# Patient Record
Sex: Female | Born: 1965
Health system: Southern US, Community
[De-identification: ages and names within clinical notes are randomized; demographics above are authoritative.]

## PROBLEM LIST (undated history)

## (undated) DIAGNOSIS — E873 Alkalosis: Secondary | ICD-10-CM

## (undated) DIAGNOSIS — I10 Essential (primary) hypertension: Secondary | ICD-10-CM

## (undated) DIAGNOSIS — K8020 Calculus of gallbladder without cholecystitis without obstruction: Secondary | ICD-10-CM

## (undated) DIAGNOSIS — K219 Gastro-esophageal reflux disease without esophagitis: Secondary | ICD-10-CM

## (undated) DIAGNOSIS — D649 Anemia, unspecified: Secondary | ICD-10-CM

## (undated) DIAGNOSIS — M1990 Unspecified osteoarthritis, unspecified site: Secondary | ICD-10-CM

## (undated) DIAGNOSIS — J189 Pneumonia, unspecified organism: Secondary | ICD-10-CM

## (undated) DIAGNOSIS — J449 Chronic obstructive pulmonary disease, unspecified: Secondary | ICD-10-CM

## (undated) DIAGNOSIS — D869 Sarcoidosis, unspecified: Secondary | ICD-10-CM

## (undated) DIAGNOSIS — E039 Hypothyroidism, unspecified: Secondary | ICD-10-CM

## (undated) DIAGNOSIS — F458 Other somatoform disorders: Secondary | ICD-10-CM

## (undated) DIAGNOSIS — I509 Heart failure, unspecified: Secondary | ICD-10-CM

## (undated) DIAGNOSIS — N200 Calculus of kidney: Secondary | ICD-10-CM

## (undated) DIAGNOSIS — J302 Other seasonal allergic rhinitis: Secondary | ICD-10-CM

## (undated) DIAGNOSIS — G4730 Sleep apnea, unspecified: Secondary | ICD-10-CM

## (undated) DIAGNOSIS — Z8719 Personal history of other diseases of the digestive system: Secondary | ICD-10-CM

## (undated) HISTORY — DX: Gastro-esophageal reflux disease without esophagitis: K21.9

## (undated) HISTORY — DX: Hypothyroidism, unspecified: E03.9

## (undated) HISTORY — DX: Sleep apnea, unspecified: G47.30

## (undated) HISTORY — DX: Anemia, unspecified: D64.9

## (undated) HISTORY — DX: Essential (primary) hypertension: I10

## (undated) HISTORY — DX: Calculus of kidney: N20.0

## (undated) HISTORY — PX: LUNG BIOPSY: SHX232

## (undated) HISTORY — PX: OTHER SURGICAL HISTORY: SHX169

## (undated) HISTORY — DX: Calculus of gallbladder without cholecystitis without obstruction: K80.20

## (undated) HISTORY — DX: Morbid (severe) obesity due to excess calories: E66.01

---

## 1998-10-31 HISTORY — PX: CHOLECYSTECTOMY: SHX55

## 1999-06-15 ENCOUNTER — Encounter: Payer: Self-pay | Admitting: *Deleted

## 1999-06-15 ENCOUNTER — Ambulatory Visit (HOSPITAL_COMMUNITY): Admission: RE | Admit: 1999-06-15 | Discharge: 1999-06-15 | Payer: Self-pay | Admitting: *Deleted

## 1999-06-18 ENCOUNTER — Encounter (INDEPENDENT_AMBULATORY_CARE_PROVIDER_SITE_OTHER): Payer: Self-pay | Admitting: Specialist

## 1999-06-18 ENCOUNTER — Observation Stay (HOSPITAL_COMMUNITY): Admission: RE | Admit: 1999-06-18 | Discharge: 1999-06-19 | Payer: Self-pay | Admitting: *Deleted

## 2004-08-06 ENCOUNTER — Ambulatory Visit (HOSPITAL_BASED_OUTPATIENT_CLINIC_OR_DEPARTMENT_OTHER): Admission: RE | Admit: 2004-08-06 | Discharge: 2004-08-06 | Payer: Self-pay | Admitting: Family Medicine

## 2007-08-06 ENCOUNTER — Other Ambulatory Visit: Admission: RE | Admit: 2007-08-06 | Discharge: 2007-08-06 | Payer: Self-pay | Admitting: Obstetrics and Gynecology

## 2007-08-13 ENCOUNTER — Ambulatory Visit (HOSPITAL_COMMUNITY): Admission: RE | Admit: 2007-08-13 | Discharge: 2007-08-13 | Payer: Self-pay | Admitting: Obstetrics and Gynecology

## 2007-09-05 ENCOUNTER — Encounter: Admission: RE | Admit: 2007-09-05 | Discharge: 2007-12-04 | Payer: Self-pay | Admitting: Sports Medicine

## 2007-12-03 ENCOUNTER — Encounter: Admission: RE | Admit: 2007-12-03 | Discharge: 2007-12-03 | Payer: Self-pay | Admitting: Gastroenterology

## 2010-11-21 ENCOUNTER — Encounter: Payer: Self-pay | Admitting: Obstetrics and Gynecology

## 2010-11-21 ENCOUNTER — Encounter: Payer: Self-pay | Admitting: Gastroenterology

## 2011-01-24 ENCOUNTER — Emergency Department (HOSPITAL_COMMUNITY): Payer: Medicaid Other

## 2011-01-24 ENCOUNTER — Inpatient Hospital Stay (HOSPITAL_COMMUNITY)
Admit: 2011-01-24 | Discharge: 2011-01-29 | DRG: 194 | Disposition: A | Discharge status: Home / Self Care | Payer: Medicaid Other | Attending: Internal Medicine | Admitting: Internal Medicine

## 2011-01-24 DIAGNOSIS — E662 Morbid (severe) obesity with alveolar hypoventilation: Secondary | ICD-10-CM | POA: Diagnosis present

## 2011-01-24 DIAGNOSIS — K219 Gastro-esophageal reflux disease without esophagitis: Secondary | ICD-10-CM | POA: Diagnosis present

## 2011-01-24 DIAGNOSIS — R0902 Hypoxemia: Secondary | ICD-10-CM | POA: Diagnosis present

## 2011-01-24 DIAGNOSIS — G4733 Obstructive sleep apnea (adult) (pediatric): Secondary | ICD-10-CM | POA: Diagnosis present

## 2011-01-24 DIAGNOSIS — I10 Essential (primary) hypertension: Secondary | ICD-10-CM | POA: Diagnosis present

## 2011-01-24 DIAGNOSIS — R0602 Shortness of breath: Secondary | ICD-10-CM

## 2011-01-24 DIAGNOSIS — Z9119 Patient's noncompliance with other medical treatment and regimen: Secondary | ICD-10-CM

## 2011-01-24 DIAGNOSIS — J189 Pneumonia, unspecified organism: Secondary | ICD-10-CM | POA: Diagnosis present

## 2011-01-24 DIAGNOSIS — D509 Iron deficiency anemia, unspecified: Secondary | ICD-10-CM | POA: Diagnosis present

## 2011-01-24 DIAGNOSIS — R112 Nausea with vomiting, unspecified: Secondary | ICD-10-CM | POA: Diagnosis present

## 2011-01-24 DIAGNOSIS — Z91199 Patient's noncompliance with other medical treatment and regimen due to unspecified reason: Secondary | ICD-10-CM

## 2011-01-24 DIAGNOSIS — E039 Hypothyroidism, unspecified: Secondary | ICD-10-CM | POA: Diagnosis present

## 2011-01-24 LAB — DIFFERENTIAL
Basophils Relative: 0 % (ref 0–1)
Eosinophils Relative: 4 % (ref 0–5)
Lymphs Abs: 1.4 10*3/uL (ref 0.7–4.0)
Monocytes Relative: 7 % (ref 3–12)
Neutro Abs: 6.9 10*3/uL (ref 1.7–7.7)

## 2011-01-24 LAB — PROTIME-INR: INR: 1.16 (ref 0.00–1.49)

## 2011-01-24 LAB — CBC
HCT: 33.3 % — ABNORMAL LOW (ref 36.0–46.0)
Hemoglobin: 8.8 g/dL — ABNORMAL LOW (ref 12.0–15.0)
MCH: 15.7 pg — ABNORMAL LOW (ref 26.0–34.0)
MCHC: 26.4 g/dL — ABNORMAL LOW (ref 30.0–36.0)

## 2011-01-24 LAB — BASIC METABOLIC PANEL
CO2: 30 mEq/L (ref 19–32)
Calcium: 8.9 mg/dL (ref 8.4–10.5)
Glucose, Bld: 102 mg/dL — ABNORMAL HIGH (ref 70–99)
Sodium: 136 mEq/L (ref 135–145)

## 2011-01-25 DIAGNOSIS — R0602 Shortness of breath: Secondary | ICD-10-CM

## 2011-01-25 LAB — BASIC METABOLIC PANEL
CO2: 28 mEq/L (ref 19–32)
Chloride: 101 mEq/L (ref 96–112)
GFR calc Af Amer: >60 mL/min (ref >60)
Sodium: 137 mEq/L (ref 135–145)

## 2011-01-25 LAB — DIFFERENTIAL
Basophils Relative: 0 % (ref 0–1)
Eosinophils Relative: 4 % (ref 0–5)
Lymphs Abs: 1.7 10*3/uL (ref 0.7–4.0)
Monocytes Relative: 7 % (ref 3–12)
Neutro Abs: 6.7 10*3/uL (ref 1.7–7.7)

## 2011-01-25 LAB — CARDIAC PANEL(CRET KIN+CKTOT+MB+TROPI)
Relative Index: INVALID (ref 0.0–2.5)
Relative Index: INVALID (ref 0.0–2.5)
Relative Index: INVALID (ref 0.0–2.5)
Total CK: 51 U/L (ref 7–177)
Total CK: 46 U/L (ref 7–177)
Total CK: 46 U/L (ref 7–177)
Troponin I: 0.01 ng/mL (ref 0.00–0.06)
Troponin I: 0.01 ng/mL (ref 0.00–0.06)

## 2011-01-25 LAB — CBC
HCT: 30.9 % — ABNORMAL LOW (ref 36.0–46.0)
Hemoglobin: 8 g/dL — ABNORMAL LOW (ref 12.0–15.0)
MCH: 15.5 pg — ABNORMAL LOW (ref 26.0–34.0)
MCHC: 25.9 g/dL — ABNORMAL LOW (ref 30.0–36.0)
MCV: 59.8 fL — ABNORMAL LOW (ref 78.0–100.0)

## 2011-01-25 LAB — HEPARIN LEVEL (UNFRACTIONATED)
Heparin Unfractionated: 0.13 IU/mL — ABNORMAL LOW (ref 0.30–0.70)
Heparin Unfractionated: 0.26 IU/mL — ABNORMAL LOW (ref 0.30–0.70)

## 2011-01-25 LAB — IRON AND TIBC
Iron: 11 ug/dL — ABNORMAL LOW (ref 42–135)
TIBC: 335 ug/dL (ref 250–470)

## 2011-01-25 LAB — TSH: TSH: 4.63 u[IU]/mL — ABNORMAL HIGH (ref 0.350–4.500)

## 2011-01-26 DIAGNOSIS — R0602 Shortness of breath: Secondary | ICD-10-CM

## 2011-01-26 DIAGNOSIS — G4730 Sleep apnea, unspecified: Secondary | ICD-10-CM

## 2011-01-26 DIAGNOSIS — R05 Cough: Secondary | ICD-10-CM

## 2011-01-26 DIAGNOSIS — R059 Cough, unspecified: Secondary | ICD-10-CM

## 2011-01-26 DIAGNOSIS — E662 Morbid (severe) obesity with alveolar hypoventilation: Secondary | ICD-10-CM

## 2011-01-26 DIAGNOSIS — G4710 Hypersomnia, unspecified: Secondary | ICD-10-CM

## 2011-01-26 LAB — CBC
HCT: 29.3 % — ABNORMAL LOW (ref 36.0–46.0)
HCT: 30.5 % — ABNORMAL LOW (ref 36.0–46.0)
Hemoglobin: 7.7 g/dL — ABNORMAL LOW (ref 12.0–15.0)
Hemoglobin: 7.6 g/dL — ABNORMAL LOW (ref 12.0–15.0)
MCH: 15.4 pg — ABNORMAL LOW (ref 26.0–34.0)
MCH: 15.2 pg — ABNORMAL LOW (ref 26.0–34.0)
MCH: 15.3 pg — ABNORMAL LOW (ref 26.0–34.0)
MCHC: 25.6 g/dL — ABNORMAL LOW (ref 30.0–36.0)
MCHC: 25.2 g/dL — ABNORMAL LOW (ref 30.0–36.0)
MCV: 60 fL — ABNORMAL LOW (ref 78.0–100.0)
MCV: 60.1 fL — ABNORMAL LOW (ref 78.0–100.0)
RBC: 4.96 MIL/uL (ref 3.87–5.11)
RDW: 23.6 % — ABNORMAL HIGH (ref 11.5–15.5)
WBC: 7.1 10*3/uL (ref 4.0–10.5)

## 2011-01-26 LAB — URINE MICROSCOPIC-ADD ON

## 2011-01-26 LAB — URINALYSIS, ROUTINE W REFLEX MICROSCOPIC
Ketones, ur: NEGATIVE mg/dL
Nitrite: NEGATIVE
Protein, ur: NEGATIVE mg/dL
Specific Gravity, Urine: 1.021 (ref 1.005–1.030)
Urobilinogen, UA: 0.2 mg/dL (ref 0.0–1.0)

## 2011-01-26 LAB — BASIC METABOLIC PANEL
Calcium: 8.7 mg/dL (ref 8.4–10.5)
Creatinine, Ser: 0.67 mg/dL (ref 0.4–1.2)
GFR calc non Af Amer: >60 mL/min (ref >60)
Glucose, Bld: 102 mg/dL — ABNORMAL HIGH (ref 70–99)
Sodium: 137 mEq/L (ref 135–145)

## 2011-01-26 LAB — VITAMIN B12: Vitamin B-12: 600 pg/mL (ref 211–911)

## 2011-01-26 LAB — IRON AND TIBC
Iron: 13 ug/dL — ABNORMAL LOW (ref 42–135)
Saturation Ratios: 4 % — ABNORMAL LOW (ref 20–55)
UIBC: 329 ug/dL

## 2011-01-26 LAB — BLOOD GAS, ARTERIAL
Acid-Base Excess: 4.8 mmol/L — ABNORMAL HIGH (ref 0.0–2.0)
Bicarbonate: 29.5 mEq/L — ABNORMAL HIGH (ref 20.0–24.0)
O2 Saturation: 83.6 %
pCO2 arterial: 47.9 mmHg — ABNORMAL HIGH (ref 35.0–45.0)
pO2, Arterial: 48.7 mmHg — ABNORMAL LOW (ref 80.0–100.0)

## 2011-01-27 ENCOUNTER — Inpatient Hospital Stay (HOSPITAL_COMMUNITY): Payer: Medicaid Other

## 2011-01-27 LAB — CBC
HCT: 29.2 % — ABNORMAL LOW (ref 36.0–46.0)
MCHC: 25.7 g/dL — ABNORMAL LOW (ref 30.0–36.0)
MCV: 60.6 fL — ABNORMAL LOW (ref 78.0–100.0)
Platelets: 178 10*3/uL (ref 150–400)
RDW: 23.3 % — ABNORMAL HIGH (ref 11.5–15.5)
WBC: 4.9 10*3/uL (ref 4.0–10.5)

## 2011-01-27 LAB — BASIC METABOLIC PANEL
BUN: 7 mg/dL (ref 6–23)
Calcium: 8.6 mg/dL (ref 8.4–10.5)
Creatinine, Ser: 0.7 mg/dL (ref 0.4–1.2)
GFR calc non Af Amer: >60 mL/min (ref >60)
Glucose, Bld: 109 mg/dL — ABNORMAL HIGH (ref 70–99)
Potassium: 3.9 mEq/L (ref 3.5–5.1)

## 2011-01-27 LAB — HEPARIN LEVEL (UNFRACTIONATED): Heparin Unfractionated: 0.47 IU/mL (ref 0.30–0.70)

## 2011-01-27 MED ORDER — TECHNETIUM TO 99M ALBUMIN AGGREGATED
4.9000 | Freq: Once | INTRAVENOUS | Status: AC | PRN
  Administered 2011-01-27: 4.9 via INTRAVENOUS

## 2011-01-27 MED ORDER — XENON XE 133 GAS
7.5000 | GAS_FOR_INHALATION | Freq: Once | RESPIRATORY_TRACT | Status: AC | PRN
  Administered 2011-01-27: 7.5 via RESPIRATORY_TRACT

## 2011-01-28 ENCOUNTER — Inpatient Hospital Stay (HOSPITAL_COMMUNITY): Payer: Medicaid Other

## 2011-01-28 ENCOUNTER — Ambulatory Visit (HOSPITAL_COMMUNITY): Payer: Medicaid Other

## 2011-01-28 DIAGNOSIS — G4710 Hypersomnia, unspecified: Secondary | ICD-10-CM

## 2011-01-28 DIAGNOSIS — I2699 Other pulmonary embolism without acute cor pulmonale: Secondary | ICD-10-CM

## 2011-01-28 DIAGNOSIS — G4730 Sleep apnea, unspecified: Secondary | ICD-10-CM

## 2011-01-28 DIAGNOSIS — E662 Morbid (severe) obesity with alveolar hypoventilation: Secondary | ICD-10-CM

## 2011-01-28 DIAGNOSIS — R0602 Shortness of breath: Secondary | ICD-10-CM

## 2011-01-28 LAB — CBC
MCH: 15.7 pg — ABNORMAL LOW (ref 26.0–34.0)
MCHC: 26 g/dL — ABNORMAL LOW (ref 30.0–36.0)
MCV: 60.3 fL — ABNORMAL LOW (ref 78.0–100.0)
Platelets: 182 10*3/uL (ref 150–400)
RBC: 4.78 MIL/uL (ref 3.87–5.11)
RDW: 23.4 % — ABNORMAL HIGH (ref 11.5–15.5)

## 2011-01-28 LAB — HEPARIN LEVEL (UNFRACTIONATED): Heparin Unfractionated: 0.46 IU/mL (ref 0.30–0.70)

## 2011-01-28 MED ORDER — IOHEXOL 350 MG/ML SOLN
100.0000 mL | Freq: Once | INTRAVENOUS | Status: AC | PRN
  Administered 2011-01-28: 100 mL via INTRAVENOUS

## 2011-01-29 LAB — CBC
Hemoglobin: 7.7 g/dL — ABNORMAL LOW (ref 12.0–15.0)
MCH: 15.9 pg — ABNORMAL LOW (ref 26.0–34.0)
MCHC: 26.5 g/dL — ABNORMAL LOW (ref 30.0–36.0)
Platelets: 199 10*3/uL (ref 150–400)
RDW: 23.6 % — ABNORMAL HIGH (ref 11.5–15.5)

## 2011-01-29 LAB — BASIC METABOLIC PANEL
Chloride: 98 mEq/L (ref 96–112)
GFR calc Af Amer: >60 mL/min (ref >60)
GFR calc non Af Amer: >60 mL/min (ref >60)
Glucose, Bld: 104 mg/dL — ABNORMAL HIGH (ref 70–99)

## 2011-01-29 LAB — HEPARIN LEVEL (UNFRACTIONATED): Heparin Unfractionated: 0.29 IU/mL — ABNORMAL LOW (ref 0.30–0.70)
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
## 2011-02-10 ENCOUNTER — Encounter: Payer: Self-pay | Admitting: Adult Health

## 2011-02-10 ENCOUNTER — Ambulatory Visit (INDEPENDENT_AMBULATORY_CARE_PROVIDER_SITE_OTHER): Payer: Medicaid Other | Admitting: Adult Health

## 2011-02-10 ENCOUNTER — Telehealth: Payer: Self-pay | Admitting: Pulmonary Disease

## 2011-02-10 VITALS — BP 128/66 | HR 106 | Temp 99.2°F | Ht 66.0 in | Wt >= 6400 oz

## 2011-02-10 DIAGNOSIS — E662 Morbid (severe) obesity with alveolar hypoventilation: Secondary | ICD-10-CM

## 2011-02-10 DIAGNOSIS — J209 Acute bronchitis, unspecified: Secondary | ICD-10-CM

## 2011-02-10 DIAGNOSIS — G4733 Obstructive sleep apnea (adult) (pediatric): Secondary | ICD-10-CM | POA: Insufficient documentation

## 2011-02-10 MED ORDER — MOXIFLOXACIN HCL 400 MG PO TABS: 400.0000 mg | ORAL_TABLET | Freq: Every day | ORAL | Status: AC

## 2011-02-10 NOTE — Progress Notes (Signed)
  Subjective:    Patient ID: [name], female    DOB: 1965-11-11, 45 y.o.   MRN: [id number]  HPI 45 yo female seen in hospital 01/26/11 for pulmonary consult for acute dyspnea and hypoxia found to have OHS and OSA with  recs for continuous O2 and Nocturnal CPAP   02/10/11 Post Hospital visit and acute work in.  Pt presents for an acute work in visit today. She was admitted 3/26-3/30/12 for acute dyspnea and hypoxia.  She underwent extensive work up with echo showing mild LVH , EF 55-60% . VQ scan concerning with possible PE Subsequent CT chest was neg for PE. She was seen by Pulmonary during her stay with suspected OHS and OSA with  recs for nocturnal CPAP. She was suppose to be on nocturnal CPAP however was not using this prior to admission. Was  On ACE inhibitor but this was stopped due to cough.  Since discharge she says her cough has increased with congestion. Cough is worse for last 2 days now with thick yellow mucus. She has not started on CPAP because she  Has not received her machine yet.    Review of Systems Constitutional:   No  weight loss, night sweats,     HEENT:   No headaches,  Difficulty swallowing,  Tooth/dental problems, or  Sore throat,                No sneezing, itching, ear ache, nasal congestion, post nasal drip,   CV:  No chest pain,  Orthopnea, PND, swelling in lower extremities, anasarca, dizziness, palpitations, syncope.   GI  No heartburn, indigestion, abdominal pain, nausea, vomiting, diarrhea, change in bowel habits, loss of appetite, bloody stools.   Resp:  .  No wheezing.  No chest wall deformity  Skin: no rash or lesions.  GU: no dysuria, change in color of urine, no urgency or frequency.  No flank pain, no hematuria   MS:  No joint pain or swelling.     Psych:  No change in mood or affect. No depression or anxiety.  No memory loss.     Objective:   Physical Exam GEN: A/Ox3; pleasant , NAD, morbidly obese  HEENT:  [place]/AT,   EACs-clear, TMs-wnl, NOSE-clear, THROAT-clear, no lesions, no postnasal drip or exudate noted, class 3 airway.   NECK:  Supple w/ fair ROM; no JVD; normal carotid impulses w/o bruits; no thyromegaly or nodules palpated; no lymphadenopathy.  RESP  Clear  P & A; w/o, wheezes/ rales/ or rhonchi.no accessory muscle use, no dullness to percussion  CARD:  RRR, no m/r/g   Tr -1+ peripheral edema, pulses intact, no cyanosis or clubbing.  GI:   Soft & nt; nml bowel sounds; no organomegaly or masses detected, morbidly obese abd.  Musco: Warm bil.   Neuro: alert, no focal deficits noted.    Skin: Warm, no lesions or rashes         Assessment & Plan:

## 2011-02-10 NOTE — Discharge Summary (Signed)
[name], [name]         ACCOUNT NO.:  [id number]  MEDICAL RECORD NO.:  [id number]           PATIENT TYPE:  I  LOCATION:  1407                         FACILITY:  [facility]  PHYSICIAN:  Kim Shipper, MD     DATE OF BIRTH:  [birth year]  DATE OF ADMISSION:  01/24/2011 DATE OF DISCHARGE:  01/29/2011                              DISCHARGE SUMMARY   PRIMARY CARE PHYSICIAN:  [name], M.D.  CONSULTATION DURING THIS ADMISSION:  Dr. Cyril Macias from [facility]ology.  STUDIES DONE DURING THIS ADMISSION: 1. Echocardiogram which showed mild LVH, systolic function 55%-60%.     No significant valvular abnormalities were noted. 2. Chest x-ray on March 26 which showed cardiomegaly with vascular     congestion with tiny questionable pleural effusion. 3. V/Q scan March 29 which showed concerning findings in the left     lower lobe which is concerning for PE. 4. Chest x-ray repeated March 29 which showed airspace disease in the     right suggesting congestive heart failure, pneumonia on the right     not excluded. 5. CT angio of the chest was subsequently done at [facility] because     of her weight and this did not show any PE, did show slight     paratracheal right adenopathy, small layering right pleural     effusion, diffuse geographic ground-glass pulmonary opacity     consistent with slight pulmonary edema and asymmetric right     perihilar right lower lobe edema/atelectasis/infiltrate.  LABORATORY DATA:  Pertinent labs include a normal white count at the time of admission, hemoglobin between 7 and 8, MCV is 60.  Electrolytes were okay except at the time of admission when her potassium was 3.3. Cardiac enzymes were normal.  TSH was 0.630.  Iron was 13, TIBC was 342, ferritin was 20.  UA showed amber cloudy urine, small bilirubin, large blood, small leukocytes, few squamous epithelial cells, few bacteria. MRSA was negative.  No cultures were done during this  admission.  DISCHARGE DIAGNOSES: 1. Dyspnea/hypoxia possibly from pneumonitis of etiology that is not     entirely clear at this time, possibly also from obesity     hypoventilation syndrome. 2. Obstructive sleep apnea on continuous positive airway pressure. 3. Hypoxia requiring home oxygenation. 4. Morbid obesity. 5. Iron-deficiency anemia requiring followup with primary care     physician. 6. Hypertension, stable. 7. Hypothyroidism, stable.  BRIEF HOSPITAL COURSE: 1. Dyspnea and hypoxia.  This is a 45 year old morbidly obese African     American female who presented to the hospital with complaints of     shortness of breath and some left-sided chest pain.  She had     negative cardiac workup in the form of an echo and cardiac enzymes     and EKG.  The hypoxia was concerning.  She was seen by Pulmonology     for this issue.  She was put on a heparin drip at the time of     admission because PE could not be ruled out.  Her D-dimer was 2 at     the PCP's office.  So, because of her  weight issues initially we     were told that the scanners here cannot accommodate her weight     which was about 440 pounds.  Subsequently, we were told that the     V/Q scan could be done, so V/Q was done which was indeterminate     study and then we were informed that there was a scanner at [facility] which can accommodate up to 650 pounds and so the patient was     transferred there just for the study and the study ended up showing     no PE.  It did show some other nonspecific findings.  So, the     reason for hypoxia is not entirely clear.  Obesity hypoventilation     may be playing a role.  We did give her dose of IV Lasix.  Because     of the evidence for pneumonitis, we are going to give her a short     course of antibiotics as well.  Lasix will be continued at home as     she was doing in the past.  With oxygenation and CPAP, her symptoms     have improved.  She was satting 85% on room  air. 2. Iron-deficiency anemia.  She was noted to be anemic.  Anemia panel     was done which showed iron deficiency.  She will be asked to follow     up with her PCP for further workup of this issue.  She also tells     me that she has heavy periods, so I have lasted to follow up with a     gynecologist. 3. Morbid obesity.  Weight loss counseling was provided and she should     pursue this further with her PCP. 4. Hypertension, stable. 5. Obstructive sleep apnea, but the patient was not compliant with her     CPAP.  She has been set up with a pulmonologist now and she tells     me that she will be compliant.  O2 titration will be set up at home     by home health.  On the day of discharge, the patient is feeling well.  She is keen on going home.  Denies any new complaints.  Denies any shortness of breath. Her vital signs are stable.  Blood pressure is 114/76.  Her lungs are clear to auscultation.  Cardiovascular, S1 and S2 are normal, regular. No S3, S4, rubs, murmurs, or bruits.  She is morbidly obese.  ASSESSMENT AND PLAN:  As per above.  DISCHARGE MEDICATIONS: 1. Albuterol inhaler 2 puffs inhaler every 6 hours as needed for    wheezing. 2. Colace 100 mg p.o. b.i.d. 3. Ferrous sulfate 325 mg p.o. t.i.d. 4. Avelox 400 mg p.o. daily for 4 more days. 5. Ferritin 10 mg p.o. daily as needed for allergies. 6. Etodolac 400 mg twice daily as needed for pain. 7. Continue with Lasix 20 mg daily. 8. Levothyroxine 50 mcg p.o. daily. 9. Lisinopril/hydrochlorothiazide 20/12.5 p.o. daily. 10.Prilosec over-the-counter 1 tablet daily. 11.Triamcinolone topical ointment as needed.  FOLLOWUP: 1. Follow up with PCP.  The patient to call for appointment within the     next 1-2 weeks. 2. The patient has an appointment with Dr. Vassie Loll on April 26 at 4:20     p.m.  DIET:  Heart-healthy.  PHYSICAL ACTIVITY:  As tolerated.  The patient is to use CPAP as well as her home oxygen.  TOTAL TIME ON  THIS DISCHARGE ENCOUNTER:  35 minutes.   Kim Shipper, MD     GK/MEDQ  D:  01/29/2011  T:  01/29/2011  Job:  [id number]  cc:   Oretha Milch, MD [address] [place] [address]  Melida Quitter, M.D. Fax: [phone]  Electronically Signed by Kim Shipper MD on 02/10/2011 11:18:18 PM

## 2011-02-10 NOTE — Patient Instructions (Addendum)
Oxygen 2 l/m at rest and 4 l/m with walking  CPAP at night we have sent order to [facility] with O2.  Avelox 400mg  daily for 7 days , take with food Eat yogurt daily while on antibiotic Mucinex DM Twice daily  .As needed    follow up Dr. Vassie Loll  In 2 weeks as planned and As needed   Please contact office for sooner follow up if symptoms do not improve or worsen or seek emergency care

## 2011-02-10 NOTE — Telephone Encounter (Signed)
Spoke w/ pt and she c/o increased SOB and chest pains. Pt saw RA in the hospital at the end of March. Pt has hfu w/ RA 4/26 at 4:30 but states she can't wait that long. Pt is coming in to see TP today at 4:30.

## 2011-02-11 ENCOUNTER — Telehealth: Payer: Self-pay | Admitting: Adult Health

## 2011-02-11 NOTE — Telephone Encounter (Signed)
Spoke w/ pt and she states she spoke w/ [facility] after she called our office and they informed her they did have our order. They did not inform pt when they would set pt up. I advised pt if they do not come by next week to call us back. Pt states she would. Nothing further was needed

## 2011-02-14 ENCOUNTER — Telehealth: Payer: Self-pay | Admitting: Pulmonary Disease

## 2011-02-14 DIAGNOSIS — E662 Morbid (severe) obesity with alveolar hypoventilation: Secondary | ICD-10-CM | POA: Insufficient documentation

## 2011-02-14 DIAGNOSIS — J209 Acute bronchitis, unspecified: Secondary | ICD-10-CM | POA: Insufficient documentation

## 2011-02-14 NOTE — Telephone Encounter (Signed)
Pt states she is still having sob with exertion and at rest. The on-call doc instructed her to stop the iron and prilosec because she should not be taking these medications together. She is still taking avelox but still coughing and sputum is "frothy". She states her symptoms are no better. Pls advise.

## 2011-02-14 NOTE — Assessment & Plan Note (Signed)
Begin CPAP at At bedtime   Order sent to [facility]  follow up 2 weeks Dr. Vassie Loll

## 2011-02-14 NOTE — Telephone Encounter (Signed)
Pt aware of recs per TP and will call if her symptoms do not improve or get worse. She did receive her CPAP on Fri., 02/11/2011 and is using this nightly. She also wants TP to know that her GI physician called to let her know she has a hiatal hernia.

## 2011-02-14 NOTE — Telephone Encounter (Signed)
Would finish Avelox  Mucinex Twice daily  As needed  Cough/congesiton  Take extra Lasix 20mg  daily x 3 days Low salt  Has she started on CPAP -if not please get [facility] to see why she has not got this yet SHE needs this CPAP stat .

## 2011-02-14 NOTE — Assessment & Plan Note (Signed)
Needs to wear O2 at all tmes CPAP at night May need new sleep study

## 2011-02-14 NOTE — Assessment & Plan Note (Signed)
Flare with recent hospitalization  Plan:  Avelox 400mg  daily for 7 days , take with food Eat yogurt daily while on antibiotic Mucinex DM Twice daily  .As needed    follow up Dr. Vassie Loll  In 2 weeks as planned and As needed   Please contact office for sooner follow up if symptoms do not improve or worsen or seek emergency care
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
## 2011-02-22 ENCOUNTER — Encounter: Payer: Self-pay | Admitting: Pulmonary Disease

## 2011-02-24 ENCOUNTER — Ambulatory Visit (INDEPENDENT_AMBULATORY_CARE_PROVIDER_SITE_OTHER): Payer: Medicaid Other | Admitting: Pulmonary Disease

## 2011-02-24 ENCOUNTER — Encounter: Payer: Self-pay | Admitting: Pulmonary Disease

## 2011-02-24 VITALS — BP 128/78 | HR 101 | Temp 97.7°F | Ht 66.0 in | Wt >= 6400 oz

## 2011-02-24 DIAGNOSIS — E662 Morbid (severe) obesity with alveolar hypoventilation: Secondary | ICD-10-CM

## 2011-02-24 DIAGNOSIS — G4733 Obstructive sleep apnea (adult) (pediatric): Secondary | ICD-10-CM

## 2011-02-24 DIAGNOSIS — I10 Essential (primary) hypertension: Secondary | ICD-10-CM

## 2011-02-24 DIAGNOSIS — J209 Acute bronchitis, unspecified: Secondary | ICD-10-CM

## 2011-02-24 MED ORDER — HYDROCHLOROTHIAZIDE 25 MG PO TABS: 25.0000 mg | ORAL_TABLET | Freq: Every day | ORAL | Status: AC

## 2011-02-24 NOTE — Patient Instructions (Signed)
STOP taking zestoretic  Take thiazide 25 mg instead (diuretic only) Recheck BP every week & again with Dr Tiburcio Pea to see if another medication required . Schedule sleep study Trial of symbicort 160 1 puff daily x 1 month - use albuterol for rescue only

## 2011-02-24 NOTE — Progress Notes (Signed)
  Subjective:    Patient ID: [name], female    DOB: [birth year], 45 y.o.   MRN: [id number]  HPI 45 yo morbidly obese female seen in hospital 01/26/11 for pulmonary consult for acute dyspnea and hypoxia found to have OHS and OSA with  recs for continuous O2 ( 2 L/m at rest & 4L/m on walking)and Nocturnal CPAP  She was admitted 3/26-3/30/12 for acute dyspnea and hypoxia.  She underwent extensive work up with echo showing mild LVH , EF 55-60% . VQ scan concerning with possible PE , doppler neg. Subsequent CT chest was neg for PE. She was supposed to be on nocturnal CPAP however was not using this prior to admission.  02/10/11 Post Hospital visit and acute work in.  Pt presents for an acute work in visit today.  Was  Since discharge she says her cough has increased with congestion. Cough is worse for last 2 days now with thick yellow mucus.  She has not started on CPAP because she Has not received her machine yet. Given avelox   02/24/2011 I note zestoretic on her med list - we had recommended stopping this earlier due to recurrent bouts of bronchitis. She is at the 2 week mark since her last bout & is afraid of getting another one. States compliant with CPAP - mask ok, pressure ok (auto) , using it all night. Had considered gastric bypass 5 yrs ago but did not want laparotomy.      Review of Systems  Constitutional: Negative for fever and unexpected weight change.  HENT: Positive for nosebleeds, rhinorrhea and postnasal drip. Negative for ear pain, congestion, sore throat, sneezing, trouble swallowing, dental problem and sinus pressure.   Eyes: Negative for redness and itching.  Respiratory: Positive for cough. Negative for chest tightness, shortness of breath and wheezing.   Cardiovascular: Negative for palpitations and leg swelling.  Gastrointestinal: Positive for nausea. Negative for vomiting.  Genitourinary: Negative for dysuria.  Musculoskeletal: Negative for joint swelling.   Skin: Negative for rash.  Neurological: Positive for headaches.  Hematological: Bruises/bleeds easily.  Psychiatric/Behavioral: Negative for dysphoric mood. The patient is not nervous/anxious.        Objective:   Physical Exam Gen. Pleasant, morbidly obese, in no distress, normal affect ENT - no lesions, no post nasal drip Neck: No JVD, no thyromegaly, no carotid bruits Lungs: no use of accessory muscles, no dullness to percussion, clear without rales or rhonchi  Cardiovascular: Rhythm regular, heart sounds  normal, no murmurs or gallops, 1+ peripheral edema Abdomen: soft and non-tender, no hepatosplenomegaly, BS normal. Musculoskeletal: No deformities, no cyanosis or clubbing Neuro:  alert, non focal        Assessment & Plan:

## 2011-02-25 ENCOUNTER — Encounter: Payer: Self-pay | Admitting: Pulmonary Disease

## 2011-02-25 DIAGNOSIS — I10 Essential (primary) hypertension: Secondary | ICD-10-CM | POA: Insufficient documentation

## 2011-02-25 NOTE — Assessment & Plan Note (Addendum)
Stop ACE inhibitor Occult GERD my be another cause. Start symbicort for reactive airways with albuterol as rescue

## 2011-02-25 NOTE — Assessment & Plan Note (Signed)
Weight loss Ct O2 I have asked her to re-consider bariatric surgery once medical issues better controlled.

## 2011-02-25 NOTE — Assessment & Plan Note (Signed)
I have asked her to stop zestoretic , given her frequednt attacks of cough/ wheezing & use only HCTZ 25 mg If this does not control BP , then an ARB such as olmesartan can be added.

## 2011-02-25 NOTE — Assessment & Plan Note (Signed)
Obtain autoCPAP download & make changes Schedule CPAP + O2 titration study Weight loss encouraged, compliance with goal of at least 4-6 hrs every night is the expectation. Advised against medications with sedative side effects Cautioned against driving when sleepy - understanding that sleepiness will vary on a day to day basis

## 2011-03-18 ENCOUNTER — Ambulatory Visit (HOSPITAL_BASED_OUTPATIENT_CLINIC_OR_DEPARTMENT_OTHER): Payer: Medicaid Other | Attending: Pulmonary Disease

## 2011-03-18 DIAGNOSIS — Z79899 Other long term (current) drug therapy: Secondary | ICD-10-CM | POA: Insufficient documentation

## 2011-03-18 DIAGNOSIS — E662 Morbid (severe) obesity with alveolar hypoventilation: Secondary | ICD-10-CM | POA: Insufficient documentation

## 2011-03-18 DIAGNOSIS — Z6841 Body Mass Index (BMI) 40.0 and over, adult: Secondary | ICD-10-CM | POA: Insufficient documentation

## 2011-03-18 NOTE — Procedures (Signed)
NAME:  [name], [name]         ACCOUNT NO.:  [id number]   MEDICAL RECORD NO.:  [id number]          PATIENT TYPE:  OUT   LOCATION:  SLEEP CENTER                 FACILITY:  [facility]   PHYSICIAN:  Clinton D. Maple Hudson, M.D. DATE OF BIRTH:  Apr 02, 1966   DATE OF STUDY:  08/06/2004                              NOCTURNAL POLYSOMNOGRAM   STUDY DATE:  August 06, 2004   REFERRING PHYSICIAN:  Deatra James, M.D.   INDICATION FOR STUDY:  Hypersomnia with sleep apnea.   EPWORTH SLEEPINESS SCORE:  18/24   NECK SIZE:  18 inches   BODY MASS INDEX:  64   WEIGHT:  400 pounds   SLEEP ARCHITECTURE:  Total sleep time 395 minutes with sleep efficiency 92%,  stage I was 4%, stage II 65%, stages III and IV were absent, REM was 30% of  total sleep time.  Latency to sleep onset 14 minutes, latency to REM 225  minutes, awake after sleep onset 25 minutes, arousal index 44.   RESPIRATORY DATA:  Split-study protocol.  RDI 165/hr indicating very severe  obstructive sleep apnea/hypopnea syndrome before CPAP titration.  This  reflected 340 obstructive apneas, 118 hypopneas before CPAP.  She slept  supine for most of the night but events were also noted when she slept  briefly on left and ride sides.  REM RDI was 10/hr.  CPAP was titrated to 10  CWP, RDI 8/hr using a medium Respironics ComfortGel Mask.   OXYGEN DATA:  Moderate to loud snoring with severe oxygen desaturation to a  nadir of 58% before CPAP.  After CPAP titration oxygen saturation held 95%  on room air.   CARDIAC DATA:  Normal sinus rhythm.   MOVEMENT/PARASOMNIA:  Occasional leg jerk with insignificant effect on  sleep.   IMPRESSION/RECOMMENDATION:  Very severe obstructive sleep apnea/hypopnea  syndrome, respiratory disturbance index 165/hr with severe oxygen  desaturation to 58%.  Continuous positive airway pressure was titrated  to 10 CWP, respiratory disturbance index 8/hr using a medium Respironics  ComfortGel Mask.  She may benefit from  initial home trial at 11 CWP.      CDY/MEDQ  D:  08/15/2004 10:12:05  T:  08/15/2004 19:29:58  Job:  [id number]

## 2011-03-23 DIAGNOSIS — E662 Morbid (severe) obesity with alveolar hypoventilation: Secondary | ICD-10-CM

## 2011-03-23 DIAGNOSIS — Z6841 Body Mass Index (BMI) 40.0 and over, adult: Secondary | ICD-10-CM

## 2011-03-23 DIAGNOSIS — Z79899 Other long term (current) drug therapy: Secondary | ICD-10-CM

## 2011-03-24 NOTE — Procedures (Signed)
NAMECORINNE, Macias NO.:  [id number]  MEDICAL RECORD NO.:  [id number]          PATIENT TYPE:  OUT  LOCATION:  SLEEP CENTER                 FACILITY:  [facility]  PHYSICIAN:  Oretha Milch, MD      DATE OF BIRTH:  May 09, 1966  DATE OF STUDY:  03/18/2011                           NOCTURNAL POLYSOMNOGRAM  REFERRING PHYSICIAN:  Shaleka Brines V. Tod Abrahamsen  INDICATION FOR STUDY:  [name] is a 45 year old morbidly obese woman with obesity hypoventilation syndrome and the recent hospital admission for hypoxemia where only bothersome was ruled out.  At the time of this study, she weighed 479 pounds with a height of 5 feet 6 inches, BMI of 77, neck size of 17 inches.  EPWORTH SLEEPINESS SCORE:  9.  MEDICATIONS:  Albuterol, Docusate, etodolac, iron, levothyroxine, loratadine, omeprazole, triamcinolone cream.  This CPAP titration study was performed with a sleep technologist in attendance.  EEG, EMG, EKG, and respiratory parameters were recorded. Sleep stages arousals, limb movements, and respiratory data were scored according to criteria laid out by the American Academy of Sleep Medicine.  SLEEP ARCHITECTURE:  Lights off was at 11:05 p.m., lights on was at 5:20 a.m., total sleep time was 325 minutes with a sleep period time 360 minutes and sleep efficiency of 87%.  Sleep latency was 15 minutes and wake after sleep onset was 35 minutes.  Latency to REM sleep was 160 minutes.  Sleep stages of the percentage total sleep time was, N1  6%, N2  80% and N3 0.5%.  REM sleep 13% (42 minutes).  No supine sleep was noted.  Arousal Data:  There were 73 arousals with an arousal index of 14 events per hour.  RESPIRATORY DATA:  There were no apneas are hypopneas noted.  There were 3 RERAs noted.  CPAP was initiated at 5 cm and titrated to 7 cm for snoring.  At this level for 20 minutes including 6 minutes of REM sleep. No events were noted.  OXYGEN DATA:  Desaturation index was 0 per  hour.  She spent 0.3 minutes with saturation less than 88%.  She spent 1 minute with a saturation less than 88%.  CARDIAC DATA:  Low heart rate was 39 beats per minute, the high heart rate recorded was an artifact.  MOVEMENT-PARASOMNIA:  No significant limb movements were noted.  Discussion:  She was desensitized with a medium Mirage nasal mask.  She slept on a wedge throughout the study.  Note that the complete absence of events makes the diagnosis of obstructive sleep apnea questionable.  IMPRESSIONS-RECOMMENDATIONS: 1. Mild sleep disordered breathing with mild oxygen desaturations,     this was corrected with CPAP of 7 cm with a medium nasal mask. 2. No evidence of cardiac arrhythmias, limb movements, or behavioral     disturbance during sleep. 3. Her physiology seems to be more consistent with obesity     hypoventilation than obstructive sleep apnea.  Recommend: 1. CPAP can be continued at 7 cm with a medium nasal mask and     compliance monitored at this level. 2. She should be cautioned against driving when sleepy. 3. She should be counseled against medications with sedative side     effects.  Oretha Milch, MD Electronically Signed    RVA/MEDQ  D:  03/23/2011 14:52:08  T:  03/24/2011 03:13:02  Job:  [id number]

## 2011-04-08 ENCOUNTER — Encounter (HOSPITAL_BASED_OUTPATIENT_CLINIC_OR_DEPARTMENT_OTHER): Payer: Medicaid Other

## 2011-04-22 ENCOUNTER — Encounter: Payer: Self-pay | Admitting: Pulmonary Disease

## 2011-04-22 ENCOUNTER — Ambulatory Visit (INDEPENDENT_AMBULATORY_CARE_PROVIDER_SITE_OTHER): Payer: Medicaid Other | Admitting: Pulmonary Disease

## 2011-04-22 DIAGNOSIS — I10 Essential (primary) hypertension: Secondary | ICD-10-CM

## 2011-04-22 DIAGNOSIS — E662 Morbid (severe) obesity with alveolar hypoventilation: Secondary | ICD-10-CM

## 2011-04-22 DIAGNOSIS — G4733 Obstructive sleep apnea (adult) (pediatric): Secondary | ICD-10-CM

## 2011-04-22 NOTE — Progress Notes (Signed)
  Subjective:    Patient ID: [name], female    DOB: 16-Mar-1966, 45 y.o.   MRN: [id number]  HPI 45 yo morbidly obese female seen in hospital 01/26/11 for pulmonary consult for acute dyspnea and hypoxia found to have OHS and OSA with recs for continuous O2 ( 2 L/m at rest & 4L/m on walking)and Nocturnal CPAP  She underwent extensive work up with echo showing mild LVH , EF 55-60% . VQ scan intermed prob , doppler neg. Subsequent CT chest was neg for PE.    02/24/2011  Stopped zestoretic . Had considered gastric bypass 5 yrs ago but did not want laparotomy.  04/22/2011 BP high -started on losartan -HCTZ O2 makes her nose stuffy - compliant CPAP titration 5/12 (wt 479 lbs) showed CPAP 7 cm to stop snoring, No desaturation ON O2 2 L/min satn 95% RA Lost 9 lbs     Review of Systems Pt denies any significant  nasal congestion or excess secretions, fever, chills, sweats, unintended wt loss, pleuritic or exertional cp, orthopnea pnd or leg swelling.  Pt also denies any obvious fluctuation in symptoms with weather or environmental change or other alleviating or aggravating factors.    Pt denies any increase in rescue therapy over baseline, denies waking up needing it or having early am exacerbations or coughing/wheezing/ or dyspnea       Objective:   Physical Exam Gen. Pleasant, obese, in no distress ENT - no lesions, no post nasal drip Neck: No JVD, no thyromegaly, no carotid bruits Lungs: no use of accessory muscles, no dullness to percussion, decreased without rales or rhonchi  Cardiovascular: Rhythm regular, heart sounds  normal, no murmurs or gallops, no peripheral edema Musculoskeletal: No deformities, no cyanosis or clubbing          Assessment & Plan:

## 2011-04-22 NOTE — Assessment & Plan Note (Signed)
OK to use ARB

## 2011-04-22 NOTE — Patient Instructions (Signed)
OK to use losartan - this does not cause cough Your CPAP will be set at 7 cm with oxygen blended in Use this at least 6 hrs every night Check O2 satn at rest Please turn in card so we can look at download

## 2011-04-22 NOTE — Assessment & Plan Note (Signed)
CPAP titration 5/12 >> 7 cm with 2 L O2 Stay on these settings Weight loss encouraged, compliance with goal of at least 4-6 hrs every night is the expectation. Advised against medications with sedative side effects Cautioned against driving when sleepy - understanding that sleepiness will vary on a day to day basis

## 2011-04-22 NOTE — Assessment & Plan Note (Signed)
Improved hypoxemia - Ok to stay off O2 at rest. Continue to use this during sleep

## 2011-04-24 ENCOUNTER — Emergency Department (HOSPITAL_BASED_OUTPATIENT_CLINIC_OR_DEPARTMENT_OTHER)
Admission: EM | Admit: 2011-04-24 | Discharge: 2011-04-24 | Disposition: A | Discharge status: Home / Self Care | Payer: Medicaid Other | Attending: Emergency Medicine | Admitting: Emergency Medicine

## 2011-04-24 DIAGNOSIS — X58XXXA Exposure to other specified factors, initial encounter: Secondary | ICD-10-CM | POA: Insufficient documentation

## 2011-04-24 DIAGNOSIS — T148XXA Other injury of unspecified body region, initial encounter: Secondary | ICD-10-CM | POA: Insufficient documentation

## 2011-05-01 HISTORY — PX: OTHER SURGICAL HISTORY: SHX169

## 2011-05-06 ENCOUNTER — Encounter: Payer: Self-pay | Admitting: Pulmonary Disease

## 2011-05-13 ENCOUNTER — Telehealth (INDEPENDENT_AMBULATORY_CARE_PROVIDER_SITE_OTHER): Payer: Self-pay

## 2011-05-13 NOTE — Telephone Encounter (Signed)
Patient called wanting to be seen in urge today 05/13/11 but full. Dr Ezzard Standing told patient to go to ER. Patient wanted to just talk antibiotic and pain medicine to see if symptoms get better and just come in next week to be seen. I made an appointment for urge on Monday to see [place]. I also told patient if symptoms didn't get better or worsen, she needed to go to ER

## 2011-05-15 ENCOUNTER — Emergency Department (HOSPITAL_COMMUNITY)
Admission: EM | Admit: 2011-05-15 | Discharge: 2011-05-15 | Disposition: A | Discharge status: Home / Self Care | Payer: Medicaid Other | Attending: Emergency Medicine | Admitting: Emergency Medicine

## 2011-05-15 DIAGNOSIS — I10 Essential (primary) hypertension: Secondary | ICD-10-CM | POA: Insufficient documentation

## 2011-05-15 DIAGNOSIS — D72829 Elevated white blood cell count, unspecified: Secondary | ICD-10-CM | POA: Insufficient documentation

## 2011-05-15 DIAGNOSIS — E039 Hypothyroidism, unspecified: Secondary | ICD-10-CM | POA: Insufficient documentation

## 2011-05-15 DIAGNOSIS — K612 Anorectal abscess: Secondary | ICD-10-CM | POA: Insufficient documentation

## 2011-05-15 LAB — BASIC METABOLIC PANEL
BUN: 14 mg/dL (ref 6–23)
Calcium: 9.9 mg/dL (ref 8.4–10.5)
GFR calc non Af Amer: >60 mL/min (ref >60)
Glucose, Bld: 98 mg/dL (ref 70–99)

## 2011-05-15 LAB — CBC
HCT: 34.3 % — ABNORMAL LOW (ref 36.0–46.0)
Hemoglobin: 10.5 g/dL — ABNORMAL LOW (ref 12.0–15.0)
MCH: 20.4 pg — ABNORMAL LOW (ref 26.0–34.0)
MCHC: 30.6 g/dL (ref 30.0–36.0)
MCV: 66.6 fL — ABNORMAL LOW (ref 78.0–100.0)

## 2011-05-15 LAB — DIFFERENTIAL
Basophils Relative: 0 % (ref 0–1)
Eosinophils Absolute: 0.2 10*3/uL (ref 0.0–0.7)
Lymphs Abs: 2 10*3/uL (ref 0.7–4.0)
Monocytes Absolute: 1 10*3/uL (ref 0.1–1.0)
Neutro Abs: 17.2 10*3/uL — ABNORMAL HIGH (ref 1.7–7.7)
Neutrophils Relative %: 84 % — ABNORMAL HIGH (ref 43–77)

## 2011-05-16 ENCOUNTER — Other Ambulatory Visit (INDEPENDENT_AMBULATORY_CARE_PROVIDER_SITE_OTHER): Payer: Self-pay | Admitting: General Surgery

## 2011-05-16 ENCOUNTER — Encounter (INDEPENDENT_AMBULATORY_CARE_PROVIDER_SITE_OTHER): Payer: Self-pay | Admitting: General Surgery

## 2011-05-16 ENCOUNTER — Ambulatory Visit (INDEPENDENT_AMBULATORY_CARE_PROVIDER_SITE_OTHER): Payer: Medicaid Other | Admitting: General Surgery

## 2011-05-16 VITALS — BP 120/74 | HR 120 | Wt >= 6400 oz

## 2011-05-16 DIAGNOSIS — K611 Rectal abscess: Secondary | ICD-10-CM

## 2011-05-16 DIAGNOSIS — K612 Anorectal abscess: Secondary | ICD-10-CM

## 2011-05-16 MED ORDER — AMBULATORY NON FORMULARY MEDICATION: 15.0000 g | Freq: Four times a day (QID) | Status: DC

## 2011-05-16 NOTE — Progress Notes (Deleted)
Subjective:     Patient ID: [name], female   DOB: 1966-10-03, 45 y.o.   MRN: [id number]  HPI  Se.he's had a cy her recent hospitalization and says that she was on medications that have caused her to be constipated.  He was originally given Colace but when she completed because she did not have it refilled became constipated again and then started having more anal pain. She was seen in followup by Dr. Alex Gardener LTO he had fecal physicians and was thought to have a pararectal abscess and advised her to emerge from she refused to do that but wanted be seen here in the office on Monday. He has taken antibiotics over the weekend she has not noted any purulent drainage. Review of Systems No previous perirectal abscesses hemorrhoids or any type of anal rectal problems requiring surgery    Objective:   Physical ExamThe patient tried examiners difficult she is wider than the table wouldn't lay in her parotid on the proctoscopy table in the OR minor OR room she is comfortable and on examination you can see the sphincter area well and she doesn't has a posterior acute and chronic fissure she does not want to do a rectal exam because of the spasm but after medicating her with 2% lidocaine ointment for about a 10 minute wait you can examine her pain as well as no evidence of any perirectal abscess and the fissure was slightly to the left of midline where she's having the pain I do not think that there is any evidence of a perirectal abscess but is difficult to do an anoscopic exam with instrument in this position and uncomfortable trying to light her laterally on the table severe that she may follow the table turned over     Assessment:       I do not thinkThere is any evidence of an acute perirectal abscess I 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 Plan:     ***

## 2011-05-16 NOTE — Progress Notes (Signed)
Subjective:     Patient ID: [name], female   DOB: [birth year], 45 y.o.   MRN: [id number]  HPI Comments: This is a 45 year old female referred by the office of Dr. Holley Bouche evaluation of anal pain and anal fissure. She had a severe episode of constipation 2 weeks ago. She had a very painful, with some bleeding. She been having increasing pain. She seen in the emergency department had a white cell count of 20,000. She was referred to her office for further evaluation and treatment.    Review of Systems     Objective:   Physical Exam  Constitutional:       Morbidly obese.  Uncomfortable.  Genitourinary:       Left sided fissure and perianal swelling with tenderness and fluctuance.       Assessment:     Left anal fissure and perianal abscess.    Plan:     Incision and drainage here under local anesthesia.  She was placed in the prone position the left perianal area sterilely prepped and draped. A left perianal skin was widely anesthetized with 1% Xylocaine with epinephrine. Using a 19-gauge needle I aspirated the perianal area and purulent drainage was evacuated. I removed the needle and made an incision and purulent material under pressure was evacuated. There is a large amount of this. Using a digital exam loculations were broken up. A triangular full-thickness plug of skin was then removed. Bleeding was controlled and skin edges using electrocautery. A Penrose drain was then inserted into the wound. It was anchored to the skin with chromic suture. A bulky dressing was then applied.  He tolerated the procedure well. We'll have her return to the office in 2 days to have just removed. Will have her continue her Augmentin. Will give her Vicodin for pain.

## 2011-05-16 NOTE — Patient Instructions (Signed)
Call for heavy bleeding.

## 2011-05-17 ENCOUNTER — Ambulatory Visit (INDEPENDENT_AMBULATORY_CARE_PROVIDER_SITE_OTHER): Payer: Medicaid Other

## 2011-05-17 ENCOUNTER — Other Ambulatory Visit (INDEPENDENT_AMBULATORY_CARE_PROVIDER_SITE_OTHER): Payer: Self-pay

## 2011-05-17 ENCOUNTER — Telehealth (INDEPENDENT_AMBULATORY_CARE_PROVIDER_SITE_OTHER): Payer: Self-pay | Admitting: General Surgery

## 2011-05-17 MED ORDER — HYDROCODONE-ACETAMINOPHEN 5-500 MG PO TABS: 1.0000 | ORAL_TABLET | ORAL | Status: DC | PRN

## 2011-05-17 NOTE — Progress Notes (Signed)
Patient called c/o loosing some of her packing and bleeding.  Brought into office that hour for a nurse only.  Old dressing removed. 2/3 of dressing was saturated with seroserous fluid.  No active bleeding noted.  Drain and packing intact.  Dressing removed and replaced with secure clean dry dressing.  Patient alert and oriented x 3, no signs of distress. Patient encouraged to call office with any further problems.  Patient has physician appointment tomorrow.

## 2011-05-17 NOTE — Telephone Encounter (Signed)
Patient called to c/o diarrhea and stool in her drain and wound. Per Dr Donell Beers patient advised to clean out wound in the shower as best she can and to redress with a clean and dry dressing. Patient further advised to keep her appointment tomorrow with Dr Donell Beers.

## 2011-05-17 NOTE — Progress Notes (Signed)
Addended by: Milas Hock on: 05/17/2011 10:19 AM   Modules accepted: Orders

## 2011-05-18 ENCOUNTER — Encounter (INDEPENDENT_AMBULATORY_CARE_PROVIDER_SITE_OTHER): Payer: Self-pay | Admitting: General Surgery

## 2011-05-18 ENCOUNTER — Ambulatory Visit (INDEPENDENT_AMBULATORY_CARE_PROVIDER_SITE_OTHER): Payer: Medicaid Other | Admitting: General Surgery

## 2011-05-18 VITALS — BP 174/106 | HR 136 | Temp 95.9°F | Ht 66.0 in | Wt >= 6400 oz

## 2011-05-18 DIAGNOSIS — K610 Anal abscess: Secondary | ICD-10-CM

## 2011-05-18 DIAGNOSIS — K612 Anorectal abscess: Secondary | ICD-10-CM

## 2011-05-18 NOTE — Progress Notes (Signed)
Subjective:     Patient ID: [name], female   DOB: [birth year], 45 y.o.   MRN: [id number]  HPI Patient is 2 days status post incision and drainage of left perirectal abscess. She weighs over 400 pounds and has troubled cleaning this. She is homebound on home oxygen. She has been taking Augmentin. She has started having diarrhea since starting on the Augmentin. She continues to have some soreness around incision. She has a Penrose drain in place. She is not having fevers or chills. Review of Systems     Objective:   Physical Exam Left gluteal region continues to have some induration but no erythema. The Penrose drain is in place. The wound is probed and there is no other sign of compartmentalized abscess. There is still cellulitis of the skin surrounding the incision.    Assessment:     L anorectal abscess     Plan:     Home health for wound care.  Irrigate wound qday with water or saline. Continue antibiotics. Follow up in one week.

## 2011-05-18 NOTE — Patient Instructions (Signed)
Wash wound qday with water/saline in shower or bath.  Clean around daily and replace gauze

## 2011-05-19 ENCOUNTER — Telehealth: Payer: Self-pay | Admitting: Pulmonary Disease

## 2011-05-19 ENCOUNTER — Telehealth (INDEPENDENT_AMBULATORY_CARE_PROVIDER_SITE_OTHER): Payer: Self-pay

## 2011-05-19 ENCOUNTER — Other Ambulatory Visit (INDEPENDENT_AMBULATORY_CARE_PROVIDER_SITE_OTHER): Payer: Self-pay

## 2011-05-19 ENCOUNTER — Telehealth (INDEPENDENT_AMBULATORY_CARE_PROVIDER_SITE_OTHER): Payer: Self-pay | Admitting: General Surgery

## 2011-05-19 DIAGNOSIS — K610 Anal abscess: Secondary | ICD-10-CM

## 2011-05-19 NOTE — Telephone Encounter (Signed)
Can patient have her dressing changed once a day instead of twice a day?  She has a friend that can help but only once a day.

## 2011-05-19 NOTE — Telephone Encounter (Signed)
Morrie Sheldon from [facility] called to advise that Medicaid would not pay for home visits/dry dressing change since it is not considered "skilled nursing".  She will contact the patient today and encourage her to find a family member/friend to help with the dressing changes.

## 2011-05-19 NOTE — Telephone Encounter (Signed)
Called, spoke with pt. States she was told at last OV with RA it was ok to stay off o2 during the day when she was resting but continue to use it with activities and qhs.  Pt states since last OV, she's had a perianal abscess which caused her o2 sats to drop.  She was instructed by Dr. Lucilla Lame office at [place] to go back on o2 2 lpm 24/7.  She is calling to inform RA of this. Will forward message to RA so he is aware.

## 2011-05-20 ENCOUNTER — Ambulatory Visit (INDEPENDENT_AMBULATORY_CARE_PROVIDER_SITE_OTHER): Payer: Medicaid Other

## 2011-05-20 ENCOUNTER — Telehealth (INDEPENDENT_AMBULATORY_CARE_PROVIDER_SITE_OTHER): Payer: Self-pay | Admitting: General Surgery

## 2011-05-20 DIAGNOSIS — K612 Anorectal abscess: Secondary | ICD-10-CM

## 2011-05-20 DIAGNOSIS — K610 Anal abscess: Secondary | ICD-10-CM

## 2011-05-20 LAB — WOUND CULTURE

## 2011-05-20 NOTE — Progress Notes (Signed)
Pt here today for wound dressing change on L buttock.  I placed 2" gauze moistened with saline in the wound and covered with dry gauze and an ABD pad.  The wound looks fine, and is healing.  The pt tolerated the procedure well.  She states that her friend will change her dressing tomorrow, and her PCP will let her walk-in on Sunday for a dressing change.

## 2011-05-23 NOTE — Telephone Encounter (Signed)
Noted  

## 2011-05-25 ENCOUNTER — Ambulatory Visit (INDEPENDENT_AMBULATORY_CARE_PROVIDER_SITE_OTHER): Payer: Medicaid Other | Admitting: General Surgery

## 2011-05-25 ENCOUNTER — Encounter (INDEPENDENT_AMBULATORY_CARE_PROVIDER_SITE_OTHER): Payer: Self-pay | Admitting: General Surgery

## 2011-05-25 VITALS — Temp 96.8°F

## 2011-05-25 DIAGNOSIS — K612 Anorectal abscess: Secondary | ICD-10-CM

## 2011-05-25 DIAGNOSIS — K611 Rectal abscess: Secondary | ICD-10-CM

## 2011-05-25 NOTE — Progress Notes (Signed)
Subjective:     Patient ID: [name], female   DOB: 03-Jan-1966, 45 y.o.   MRN: [id number]  HPI This patient underwent incision and drainage of a left-sided perirectal abscess by Dr. Abbey Chatters on approximately July 16. She has completed her course of antibiotics. She has just finished all of her pain medication but would like a refill. She says the pain is much better and is only uncomfortable when they change the bandage. One of the family members is a Engineer, civil (consulting) and is assisting her with twice-daily dressing changes. She denies fever or chills. She is here for a wound check.  Review of Systems     Objective:   Physical Exam There is a clean open wound in the left perianal area. There is a radially oriented incision approximately 5 cm long by about 3 cm wide which is clean and has granulation tissue. There is a little bit of watery cloudy fluid present but no cellulitis and no unusual tenderness. The other perianal tissues looked fine. The wound was redressed.    Assessment:     Left sided perirectal abscess, status post incision and drainage. This appears to be resolved resolving slowly but without complication.    Plan:        Continue wound care.  Prescription for Vicodin, 30 tablets given to patient.  No indication for further antibiotics.  Return to see Dr. Abbey Chatters In approximately 3 weeks.

## 2011-05-25 NOTE — Patient Instructions (Signed)
Continue this same wound care and tub baths you have been doing. We called in a prescription for hydrocodone to your pharmacy. You are to return to see Dr. Abbey Chatters in approximately 3 weeks. You do not need any further antibiotics.

## 2011-06-15 ENCOUNTER — Telehealth (INDEPENDENT_AMBULATORY_CARE_PROVIDER_SITE_OTHER): Payer: Self-pay | Admitting: General Surgery

## 2011-06-15 NOTE — Telephone Encounter (Signed)
The patient contacted the office stating that her wound is still being packed daily by her friend that is a Engineer, civil (consulting). The patient states the area is to small to pack now and is getting irritated and bleeding. Would like [place] to discontinue the packing.

## 2011-06-20 ENCOUNTER — Encounter: Payer: Self-pay | Admitting: Pulmonary Disease

## 2011-06-22 ENCOUNTER — Ambulatory Visit (INDEPENDENT_AMBULATORY_CARE_PROVIDER_SITE_OTHER): Payer: Medicaid Other | Admitting: General Surgery

## 2011-06-22 VITALS — BP 138/90 | HR 88 | Temp 97.1°F

## 2011-06-22 DIAGNOSIS — K612 Anorectal abscess: Secondary | ICD-10-CM

## 2011-06-22 NOTE — Patient Instructions (Signed)
If wound continues to drain after 6 weeks from now, call us to make an appointment and be seen.

## 2011-06-22 NOTE — Progress Notes (Signed)
She is here for followup of incision and drainage of a large left anal rectal abscess. She has no pain.  Exam: Abscess cavities almost completely healed. No purulent drainage. No erythema.  Assessment: Left anal rectal abscess wound healing well by secondary intention. Suspect this may completely healed in 2-3 weeks.  Plan: Return visit p.r.n. If she has continued drainage 6 weeks from now I told her I would like to see her again.

## 2011-08-22 ENCOUNTER — Ambulatory Visit: Payer: Medicaid Other | Admitting: Adult Health

## 2011-08-22 ENCOUNTER — Inpatient Hospital Stay (HOSPITAL_COMMUNITY)
Admission: EM | Admit: 2011-08-22 | Discharge: 2011-08-26 | DRG: 189 | Disposition: A | Discharge status: Home / Self Care | Payer: Medicaid Other | Attending: Internal Medicine | Admitting: Internal Medicine

## 2011-08-22 DIAGNOSIS — G4733 Obstructive sleep apnea (adult) (pediatric): Secondary | ICD-10-CM | POA: Diagnosis present

## 2011-08-22 DIAGNOSIS — Z9981 Dependence on supplemental oxygen: Secondary | ICD-10-CM

## 2011-08-22 DIAGNOSIS — E662 Morbid (severe) obesity with alveolar hypoventilation: Secondary | ICD-10-CM | POA: Diagnosis present

## 2011-08-22 DIAGNOSIS — E039 Hypothyroidism, unspecified: Secondary | ICD-10-CM | POA: Diagnosis present

## 2011-08-22 DIAGNOSIS — J189 Pneumonia, unspecified organism: Secondary | ICD-10-CM | POA: Diagnosis present

## 2011-08-22 DIAGNOSIS — I10 Essential (primary) hypertension: Secondary | ICD-10-CM | POA: Diagnosis present

## 2011-08-22 DIAGNOSIS — D509 Iron deficiency anemia, unspecified: Secondary | ICD-10-CM | POA: Diagnosis present

## 2011-08-22 DIAGNOSIS — J9620 Acute and chronic respiratory failure, unspecified whether with hypoxia or hypercapnia: Principal | ICD-10-CM | POA: Diagnosis present

## 2011-08-23 ENCOUNTER — Ambulatory Visit (HOSPITAL_COMMUNITY)
Admit: 2011-08-23 | Discharge: 2011-08-23 | Disposition: A | Discharge status: Home / Self Care | Payer: Medicaid Other | Source: Ambulatory Visit | Attending: Internal Medicine | Admitting: Internal Medicine

## 2011-08-23 ENCOUNTER — Emergency Department (HOSPITAL_COMMUNITY): Payer: Medicaid Other

## 2011-08-23 DIAGNOSIS — R0602 Shortness of breath: Secondary | ICD-10-CM | POA: Insufficient documentation

## 2011-08-23 DIAGNOSIS — R079 Chest pain, unspecified: Secondary | ICD-10-CM | POA: Insufficient documentation

## 2011-08-23 DIAGNOSIS — I517 Cardiomegaly: Secondary | ICD-10-CM | POA: Insufficient documentation

## 2011-08-23 LAB — LIPID PANEL
Cholesterol: 171 mg/dL (ref 0–200)
HDL: 39 mg/dL — ABNORMAL LOW (ref >39)
LDL Cholesterol: 115 mg/dL — ABNORMAL HIGH (ref 0–99)
Triglycerides: 85 mg/dL (ref <150)
VLDL: 17 mg/dL (ref 0–40)

## 2011-08-23 LAB — DIFFERENTIAL
Basophils Absolute: 0 10*3/uL (ref 0.0–0.1)
Eosinophils Absolute: 0.4 10*3/uL (ref 0.0–0.7)
Lymphocytes Relative: 12 % (ref 12–46)
Lymphs Abs: 1.5 10*3/uL (ref 0.7–4.0)
Neutro Abs: 9.7 10*3/uL — ABNORMAL HIGH (ref 1.7–7.7)

## 2011-08-23 LAB — POCT I-STAT, CHEM 8
BUN: 16 mg/dL (ref 6–23)
Chloride: 97 mEq/L (ref 96–112)
HCT: 35 % — ABNORMAL LOW (ref 36.0–46.0)
Sodium: 139 mEq/L (ref 135–145)
TCO2: 29 mmol/L (ref 0–100)

## 2011-08-23 LAB — TSH: TSH: 7.251 u[IU]/mL — ABNORMAL HIGH (ref 0.350–4.500)

## 2011-08-23 LAB — HEMOGLOBIN A1C
Hgb A1c MFr Bld: 5.7 % — ABNORMAL HIGH (ref <5.7)
Mean Plasma Glucose: 117 mg/dL — ABNORMAL HIGH (ref <117)

## 2011-08-23 LAB — CBC
MCH: 19.4 pg — ABNORMAL LOW (ref 26.0–34.0)
MCHC: 29.1 g/dL — ABNORMAL LOW (ref 30.0–36.0)
MCV: 66.6 fL — ABNORMAL LOW (ref 78.0–100.0)
Platelets: 339 10*3/uL (ref 150–400)
RDW: 19.4 % — ABNORMAL HIGH (ref 11.5–15.5)
WBC: 12.6 10*3/uL — ABNORMAL HIGH (ref 4.0–10.5)

## 2011-08-23 LAB — POCT I-STAT TROPONIN I: Troponin i, poc: 0 ng/mL (ref 0.00–0.08)

## 2011-08-23 LAB — CARDIAC PANEL(CRET KIN+CKTOT+MB+TROPI): Troponin I: <0.3 ng/mL (ref <0.30)

## 2011-08-23 LAB — URINE MICROSCOPIC-ADD ON

## 2011-08-23 LAB — CK TOTAL AND CKMB (NOT AT ARMC)
CK, MB: 1.1 ng/mL (ref 0.3–4.0)
Relative Index: INVALID (ref 0.0–2.5)

## 2011-08-23 LAB — URINALYSIS, ROUTINE W REFLEX MICROSCOPIC
Bilirubin Urine: NEGATIVE
Glucose, UA: NEGATIVE mg/dL
Specific Gravity, Urine: 1.008 (ref 1.005–1.030)
pH: 5 (ref 5.0–8.0)

## 2011-08-23 LAB — PROTIME-INR: INR: 1.07 (ref 0.00–1.49)

## 2011-08-23 MED ORDER — IOHEXOL 350 MG/ML SOLN: 100.0000 mL | Freq: Once | INTRAVENOUS | Status: AC | PRN

## 2011-08-23 NOTE — H&P (Signed)
NAMEBRITINI, [name] NO.:  [id number]  MEDICAL RECORD NO.:  [id number]  LOCATION:  WLED                         FACILITY:  [facility]  PHYSICIAN:  Gery Pray, MD      DATE OF BIRTH:  [birth year]  DATE OF ADMISSION:  08/22/2011 DATE OF DISCHARGE:                             HISTORY & PHYSICAL   PRIMARY CARE PHYSICIAN:  Noberto Retort, M.D.  CODE STATUS:  Full code.  The patient goes to team I.  CHIEF COMPLAINT:  Hemoptysis.  HISTORY OF PRESENT ILLNESS:  This is a 45 year old female, who is morbidly obese.  She presents with complaints of hemoptysis, cough, and shortness of breath.  She stated that last Thursday, she started coughing up blood.  She saw her physician Dr. Tiburcio Pea, who told her she had bronchitis, prescribed her Z-Pak; told her if it has not cleared up by weekend, she should call back.  She did call today, however, she was not able to get through to him. Tonight she thought her hemoptysis wasworse.  She also had left-sided chest pain.  She stated this left-sided chest pain started from yesterday.  She called [employer], and he told her to come to the ER.  The chest pain is described as low-grade and dull-type of an ache, however, she states is approximately 8/10 and heavy.  She states she had this before when she was last admitted here in March 2012, to [facility].  She reports that she has no palpitation. She does not have diaphoresis.  She states that the pain is present with rest and with activity.  She reports no increased edema.  She states that she actually believes that her edema has been getting lesser.  She states she has not noted any wheezing.  No lightheadedness.  No dizziness.  She states she has noted some headaches.  She does have blurred vision, but this is not new, this is ongoing.  History obtained from the patient.  REVIEW OF SYSTEMS:  All 12-point systems are reviewed and negative except as noted in the HPI.  PAST  MEDICAL HISTORY:  Includes: 1. Hypertension. 2. GERD. 3. Hypothyroidism. 4. Morbid obesity. 5. Obstructive sleep apnea. 6. Chronic respiratory failure. 7. Obesity-related hypoventilation syndrome.  PAST SURGICAL HISTORY:  Includes: 1. C-section. 2. Cholecystectomy. 3. Perianal abscess.  MEDICATIONS: 1. Iron. 2. Albuterol inhaler. 3. Losartan/HCTZ 50/12.5 mg daily. 4. Levothyroxine, dose unknown. 5. Zithromax, started yesterday.  ALLERGIES:  No known drug allergies.  SOCIAL HISTORY:  Negative for tobacco, alcohol, or illicit drugs.  The patient lives at home with her son.  She does use a walker or a cane. She is on oxygen around the clock.  She has a CPAP at night.  She does not have a nebulizers at home.  FAMILY HISTORY:  Significant for diabetes mellitus and hypertension.  PHYSICAL EXAMINATION:  VITAL SIGNS:  Blood pressure 143/80, pulse 112, respirations 22, temperature 99.2, saturation 97% on room air. GENERAL:  Alert, oriented, morbidly obese patient, in no acute distress. EYES:  Pink conjunctivae, PERRLA.  ENT, moist oral mucosa.  Trachea midline. NECK:  Supple. LUNGS:  Difficult to examine due to body habitus, but no wheeze appreciated.  No use of accessory  muscles. CARDIOVASCULAR:  Regular rate and rhythm without murmurs, rigors, or gallops.  No JVD. ABDOMEN:  Obese, soft.  Positive bowel sounds.  No tenderness elicited. Unable to obtain organomegaly. NEURO:  Cranial II through XII grossly intact.  Sensation intact. MUSCULOSKELETAL:  Strength 5/5 in all extremities.  Chronic lower extremity edema. SKIN:  No rashes.  No subcutaneous crepitation.  LABS:  White blood count 12.6, hemoglobin 9.4, platelets 339.  Chest x- ray:  Probably moderate congestive heart failure, right-sided airspace disease favors pulmonary edema, even though current pneumonia cannot be excluded.  Troponin 0.0.  Sodium 139, potassium 3.9, chloride 97, BUN 16, creatinine 1.0, glucose  108.  EKG normal sinus rhythm with no ST-segment changes, tachycardic.  ASSESSMENT AND PLAN: 1. Hemoptysis. 2. Congestive heart failure. 3. Possible pneumonia.  Patient will be admitted to Telemetry.  We     will check cardiac enzymes.  Patient had an echo in March 2012.  At     that point, her EF was 55% to 60%.  Repeat echo at this point will     not be done.  We will order Lasix 40 mg IV q.12.  We will check a     lipid enzyme and a hemoglobin A1c.  We will start patient on     antibiotics, Rocephin and azithromycin.  Nebulizers will be     ordered.  Blood cultures and sputum cultures will be ordered.  We     will add a BNP as well as a chest x-ray for the morning.  We will     also add blood cultures.  We will place patient on Protonix.  We     will also add a D-dimer.  There is concern with an hemoptysis for      possible pulmonary embolism, as stated D-dimer will be     ordered.  Heparin drip ordered.  Patient is at     high-risk for pulmonary embolism given her obesity and her obvious     sedentary lifestyle.  We are unable to get a CAT scan here at     [facility] as patient will not fit in the scannere.  On her last     admission, she was sent to [facility] for the CAT scan with     contrast and for evaluation for pulmonary embolism.       CXR suggest possible PNA and CHF but images are poor due body habitus.     Defer to AM team need for CT scan. 4. Morbid obesity.  Patient's weight is 462 pounds, BMI much greater     than 40.  Dietary consult will be obtained as well as bariatric bed     will be needed. 5. Obstructive sleep apnea. 6. Chronic respiratory failure.  Continue home oxygen as well as     nocturnal CPAP. 7. Hypertension. 8. Gastroesophageal reflux disease. 9. Hypothyroidism, resume home medications.          ______________________________ Gery Pray, MD     DC/MEDQ  D:  08/23/2011  T:  08/23/2011  Job:  [id number]  Electronically Signed by Gery Pray MD on 08/23/2011 06:41:33 AM

## 2011-08-24 DIAGNOSIS — J9600 Acute respiratory failure, unspecified whether with hypoxia or hypercapnia: Secondary | ICD-10-CM

## 2011-08-24 DIAGNOSIS — R042 Hemoptysis: Secondary | ICD-10-CM

## 2011-08-24 LAB — CBC
Hemoglobin: 9.1 g/dL — ABNORMAL LOW (ref 12.0–15.0)
MCH: 19.6 pg — ABNORMAL LOW (ref 26.0–34.0)
MCHC: 29.1 g/dL — ABNORMAL LOW (ref 30.0–36.0)
Platelets: 313 10*3/uL (ref 150–400)
RDW: 19.6 % — ABNORMAL HIGH (ref 11.5–15.5)

## 2011-08-24 LAB — IRON AND TIBC
Iron: 12 ug/dL — ABNORMAL LOW (ref 42–135)
TIBC: 358 ug/dL (ref 250–470)

## 2011-08-24 LAB — URINALYSIS, ROUTINE W REFLEX MICROSCOPIC
Bilirubin Urine: NEGATIVE
Nitrite: NEGATIVE
Specific Gravity, Urine: 1.026 (ref 1.005–1.030)
Urobilinogen, UA: 0.2 mg/dL (ref 0.0–1.0)

## 2011-08-24 LAB — URINE CULTURE

## 2011-08-24 LAB — FOLATE: Folate: 6.6 ng/mL

## 2011-08-24 LAB — BASIC METABOLIC PANEL
Chloride: 96 mEq/L (ref 96–112)
Creatinine, Ser: 0.74 mg/dL (ref 0.50–1.10)
GFR calc Af Amer: >90 mL/min (ref >90)
GFR calc non Af Amer: >90 mL/min (ref >90)

## 2011-08-24 LAB — VITAMIN B12: Vitamin B-12: 480 pg/mL (ref 211–911)

## 2011-08-24 LAB — URINE MICROSCOPIC-ADD ON

## 2011-08-24 LAB — T4, FREE: Free T4: 1.1 ng/dL (ref 0.80–1.80)

## 2011-08-24 LAB — PRO B NATRIURETIC PEPTIDE: Pro B Natriuretic peptide (BNP): 16.6 pg/mL (ref 0–125)

## 2011-08-25 DIAGNOSIS — J9600 Acute respiratory failure, unspecified whether with hypoxia or hypercapnia: Secondary | ICD-10-CM

## 2011-08-25 DIAGNOSIS — R042 Hemoptysis: Secondary | ICD-10-CM

## 2011-08-25 LAB — CBC
Hemoglobin: 8.8 g/dL — ABNORMAL LOW (ref 12.0–15.0)
MCH: 19.6 pg — ABNORMAL LOW (ref 26.0–34.0)
RBC: 4.49 MIL/uL (ref 3.87–5.11)
WBC: 9.7 10*3/uL (ref 4.0–10.5)

## 2011-08-25 LAB — BASIC METABOLIC PANEL
CO2: 32 mEq/L (ref 19–32)
Chloride: 97 mEq/L (ref 96–112)
Glucose, Bld: 93 mg/dL (ref 70–99)
Potassium: 3.8 mEq/L (ref 3.5–5.1)
Sodium: 136 mEq/L (ref 135–145)

## 2011-08-25 LAB — URINE CULTURE
Colony Count: NO GROWTH
Culture  Setup Time: 201210242309
Culture: NO GROWTH
Special Requests: NEGATIVE

## 2011-08-25 LAB — CEA: CEA: <0.5 ng/mL (ref 0.0–5.0)

## 2011-08-25 LAB — RHEUMATOID FACTOR: Rhuematoid fact SerPl-aCnc: <10 IU/mL (ref <=14)

## 2011-08-25 NOTE — Consult Note (Signed)
[name], [name]         ACCOUNT NO.:  [id number]  MEDICAL RECORD NO.:  [id number]  LOCATION:  CATS                         FACILITY:  [facility]  PHYSICIAN:  Charlaine Dalton. Sherene Sires, MD, FCCPDATE OF BIRTH:  1966/05/03  DATE OF CONSULTATION:  08/24/2011 DATE OF DISCHARGE:  08/23/2011                                CONSULTATION   REASON FOR CONSULTATION:  Hemoptysis.  HISTORY:  This is a 45 year old black female, never smoker with an exceptionally challenging history that   started actually in January 2012, with the onset of a cough, "intermittently productive of green mucus" which persisted until she was admitted to [facility] on March 27, with associated left-sided chest pain with concern of a pulmonary embolism that was excluded by CT scan and improvement after treated with prolonged antibiotics with significant iron deficiency anemia noted, which was attributed to menorrhagia.  She was evaluated by the Pulmonary Team with a diagnosis of obesity hypoventilation and treated for pneumonia and improved only to relapse with cough on ACE inhibitors which seemed better after stopping ACE inhibitors as an outpatient in April, and then 2 weeks prior to this admission began "the exact same illness I had in January" "with onset of cough, worsening dyspnea, green sputum" that turned bloody within a few days and with persistent coughing up several tablespoons of blood daily associated with worsening dyspnea and admission to [facility] with now recurrent left- sided pain, "the exactly the same as it was before" when she was admitted on October 22.  She has now undergone a CT scan of the chest, which shows no evidence of pulmonary embolism, but persistent bilateral infiltrates in the same pattern as she had on her last study done on January 28, 2011, which are more extensive now.  Workup has included   B-natriuretic peptide, which was normal.  Urinalysis, which shows too numerous to count  red cells, but is complicated by the fact that she reports she is on her period.  Anemia with marked microcytic indices with a hematocrit of 32 and an MCV of 66.  She has been treated empirically for community-acquired pneumonia and continues to cough up blood, albeit on Lovenox, which is being stopped today after her negative CT scan.  She has had intermittent epistaxis in the past, but denies any epistaxis presently.  She has also had intermittent arthritis in her hands and left shoulder attributed to "osteoarthritis."  She has no unusual travel or hobby exposure history. Does use lots of ammonia and chlorox at home in cleaning bed and bathroom but denies heavy exposure or small confined or poorly ventilated conditions. Denies elicit drug use. No macrodantin or amiodarone exposure hx.  FAMILY HISTORY:  Negative for respiratory diseases or collagen vascular diseases.  PAST MEDICAL HISTORY:  Significant for: 1. Morbid obesity complicated by obstructive sleep apnea for which she     is on nocturnal CPAP and oxygen. 2. Hypothyroidism. 3. GERD. 4. Hypertension, previously intolerant to ACE inhibitors.  PAST SURGICAL HISTORY:  Status post C-section cholecystectomy and has had a perianal abscess drain in July 2012, no longer symptomatic.  MEDICATIONS AT HOME:  Include iron, albuterol p.r.n. which she rarely uses, Cozaar, and thyroid.  SOCIAL HISTORY:  She  has never smoked as noted above.  She denies any alcohol use or unusual travel, pet, or hobby exposure.  FAMILY HISTORY:  Positive for diabetes and hypertension, but negative for lung disease.  REVIEW OF SYSTEMS:  All systems reviewed in detail with patient. Negative except as outlined above.  PHYSICAL EXAMINATION:  GENERAL:  This is a pleasant black female, comfortable at 45 degrees, but on nasal oxygen.  She is afebrile with normal vital signs.  She has not had any fever either prior to or during the hospitalization, and her  saturations are 96% on 2 liters. HEENT:  Remarkable for the absence of any crusting of the turbinates or obvious bleeding source in the upper airway.  Dentition is intact. NECK:  Supple without cervical adenopathy or tenderness.  Trachea is in midline. LUNGS:  Fields are completely clear bilaterally to asucultation and percussion. CARDIOVASCULAR:  There is regular rhythm without murmur, gallop, or rub. S1, S2 were diminished. ABDOMEN:  Obese, soft, benign with no palpable organomegaly, masses, or tenderness. EXTREMITIES:  Warm without calf tenderness, cyanosis, or clubbing. There was 2+ pitting edema to his knees down bilaterally with marked chronic venous change.  Her last echocardiogram dated on January 25, 2011, was technically difficult study, that showed mild LVH and normal left atrial size.  CT scans are noted above.  LAB DATA:  Also significant for a normal creatinine at 0.74 and urinalysis as noted with no protein.  IMPRESSION: 1. Recurrent pulmonary symptoms associated with persistent/progressive     bilateral infiltrates of unclear etiology, progressing over the     last 10 months associated with hemoptysis worrisome, therefore for     underlying malignancy and collagen vascular disease.  She does not     have a history that is suggestive to me at all of pneumonia,     although she does have intermittent production of green sputum.     There has been no fever or significant leukocytosis. 2. The association with anemia with marked microcytosis is suggestive     of chronic blood loss, which previously attributed to menorrhagia     with iron deficiency documented by saturation of 3% today     indicating this problem is long-standing and needs further workup,     which may need to include a GYN workup to exclude malignancy at     some point. Pulmonary hemosiderosis is unlikely.  For now, I will recommend stopping Lovenox, treating her as a community- acquired pneumonia as  planned and doing a broad lab profile to include rheumatologic profile and also looking for evidence of either Wegener or Goodpasture syndrome.  I think both of these are much very unlikely based on the chronicity of the illness and the absence of any excess urinary protein.    Charlaine Dalton. Sherene Sires, MD, [facility]    MBW/MEDQ  D:  08/24/2011  T:  08/25/2011  Job:  [id number]  Electronically Signed by Sandrea Hughs MD FCCP on 08/25/2011 05:13:26 PM

## 2011-08-26 ENCOUNTER — Telehealth: Payer: Self-pay | Admitting: Adult Health

## 2011-08-26 LAB — CBC
MCH: 19 pg — ABNORMAL LOW (ref 26.0–34.0)
MCHC: 28 g/dL — ABNORMAL LOW (ref 30.0–36.0)
RDW: 19.4 % — ABNORMAL HIGH (ref 11.5–15.5)

## 2011-08-26 LAB — BASIC METABOLIC PANEL
BUN: 21 mg/dL (ref 6–23)
Creatinine, Ser: 0.77 mg/dL (ref 0.50–1.10)
GFR calc Af Amer: >90 mL/min (ref >90)
GFR calc non Af Amer: >90 mL/min (ref >90)
Glucose, Bld: 92 mg/dL (ref 70–99)

## 2011-08-26 NOTE — Telephone Encounter (Signed)
Dr. Vassie Loll, pls advise. Your first available is in Dec 2012. Can pt come in for HFU with TP ?

## 2011-08-26 NOTE — Telephone Encounter (Signed)
PT ADDS THAT THE DR WHO SPOKE TO HER IS IN OUR PRACTICE SHE JUST COULDN'T RECALL HIS NAME. SAYS THAT HE WILL HAVE NOTES (UPON D/C) RE: POSSIBLE BIOPSY, ETC.

## 2011-08-26 NOTE — Telephone Encounter (Signed)
[email or web address]  

## 2011-08-26 NOTE — Telephone Encounter (Signed)
Ok to double book  With me in 2-3 weeks

## 2011-08-29 ENCOUNTER — Encounter: Payer: Self-pay | Admitting: Internal Medicine

## 2011-08-29 ENCOUNTER — Ambulatory Visit: Payer: Medicaid Other | Admitting: Adult Health

## 2011-08-29 DIAGNOSIS — R918 Other nonspecific abnormal finding of lung field: Secondary | ICD-10-CM | POA: Insufficient documentation

## 2011-08-29 LAB — CULTURE, BLOOD (ROUTINE X 2)
Culture  Setup Time: 201210230842
Culture: NO GROWTH

## 2011-08-29 NOTE — Telephone Encounter (Signed)
If doing better ok to wait to see alva, if not see me or Tammy NP with all meds in hand either way

## 2011-08-29 NOTE — Telephone Encounter (Signed)
Pt will wait to see Dr. Vassie Loll on 09/13/2011 @ 2pm.

## 2011-08-29 NOTE — Telephone Encounter (Signed)
Pt returning call can be reached at [phone].[name]

## 2011-08-29 NOTE — Telephone Encounter (Signed)
Pt says she was told by Dr. Sherene Sires (whom saw the pt in the hospital) to follow-up here in the office within a week and will not make an appt either way until we clarify when she needs the appt. Dr. Sherene Sires, did you want to see this pt for HFU or does Dr. Vassie Loll need to see this pt back in 2-3 weeks? Pls advise.

## 2011-08-30 LAB — GLOMERULAR BASEMENT MEMBRANE ANTIBODIES

## 2011-08-30 LAB — ANTI-NEUTROPHIL ANTIBODY

## 2011-08-31 NOTE — Discharge Summary (Signed)
NAMEALVIA, [name]         ACCOUNT NO.:  [id number]  MEDICAL RECORD NO.:  [id number]  LOCATION:  1426                         FACILITY:  [facility]  PHYSICIAN:  Hartley Barefoot, MD    DATE OF BIRTH:  1966/03/22  DATE OF ADMISSION:  08/22/2011 DATE OF DISCHARGE:  08/26/2011                              DISCHARGE SUMMARY   DISCHARGE DIAGNOSES: 1. Acute on chronic respiratory failure, hemoptysis, questionable     pneumonia, questionable negative pressure pulmonary edema. 2. Presumed pneumonia, community-acquired. 3. Anemia, iron deficiency. 4. Hypertension. 5. Hypothyroidism. 6. Obesity hypoventilation syndrome. 7. Obstructive sleep apnea.  DISCHARGE MEDICATIONS: 1. Atrovent 1 puff inhale every 6 hours. 2. Levaquin 750 mg p.o. daily. 3. Protonix 20 mg p.o. b.i.d. 4. Albuterol 2 puffs inhale every 6 hours as needed. 5. Claritin 10 mg p.o. daily as needed. 6. Ferrous sulfate 325 p.o. t.i.d. 7. Synthroid 100 mcg daily. 8. Losartan and hydrochlorothiazide 1 tablet by mouth daily. 9. Triamcinolone 1 application topically daily as needed.  DISPOSITION AND FOLLOWUP:  [name] will need to follow up with Dr. Vassie Loll within 2 weeks for further evaluation.  BRIEF HISTORY OF PRESENT ILLNESS:  This is a very pleasant 45 year old morbidly obese, African American, who presents complaining of hemoptysis, cough, and shortness of breath.  She started to have last Thursday coughing of blood.  She saw her primary care physician, Dr. Tiburcio Pea who told her that she had bronchitis and prescribed her Z-Pak. She was also having left-sided chest pain.  She has reported no increased edema.  CONSULTANT:  Dr. Sherene Sires.  RADIOGRAPHIC STUDIES: 1. CT chest, October 27, no pulmonary embolism, bilateral     pulmonary infiltrates, right greater than left.  Similar appearance     on prior CT in January 28, 2011 or those infiltrates are more     extensive.  Chronic mild cardiomegaly. 2. Chest x-ray, October  27, moderately degraded exam.  Currently     moderate congestive heart failure.  Right-sided airspace disease.     Stable alveolar pulmonary edema.  HOSPITAL COURSE: 1. Acute on chronic respiratory failure, presumed pneumonia,     hemoptysis, questionable negative pressure pulmonary edema.  The     patient was admitted to telemetry.  There was some concern     initially for PE.  She had a CT that was negative.  The patient     received a dose of Lovenox but this was discontinued immediately.     The patient's hemoptysis remained  stable.  She was started on     ceftriaxone and azithromycin treatment for empirically for     community-acquired pneumonia.  She will be discharged on 5 more     days of Levaquin.  She had a workup for autoimmune process.  ANA     negative.  CEA negative less than 0.5.  Rheumatoid factor less than     10.  ESR very mildly elevated at 42.  Dr. Sherene Sires helped with the     management.  At this time, labs pending, glomerular basement     membrane antibody, neutrophil cytoplasmic pending.  Dr. Osborne Casco treatment for presumed pneumonia.  Recommended to her     to use her CPAP to  prevent negative pressure pulmonary edema.  She     might need a pulmonary biopsy and this will need to be re-evaluated     by Dr. Vassie Loll.  The patient during ambulation pre-exercise sat 85% to     92% on room air. During ambulation  79% to 92%, post-exercise 92%.     The patient was treated initially with Lasix, Dr. Sherene Sires recommended     to stop Lasix unlikely heart failure for her symptoms.  Her BNP was     normal. 2. Hypertension.  Will continue with her home medications. 3. Anemia, iron deficiency anemia.  Will continue with iron.  She     will need to follow up with her primary care physician.  She will     need to be a referral to a gynecologist. On the day of discharge, the patient was in improved condition.  Her hemoptysis has improved.  Blood pressure 144/81, sats 97 on 2  L, respirations 18, pulse 96, temperature 98.7.  Her discharge lab; hemoglobin 8.7, hematocrit 31, platelets 337, white blood cell 8.7. Sodium 136, potassium 4.4, chloride 98, CO2 of 32, glucose 92, BUN 21, creatinine 0.7.     Hartley Barefoot, MD     BR/MEDQ  D:  08/26/2011  T:  08/26/2011  Job:  [id number]  cc:   Oretha Milch, MD [address] [place] [address]  Melida Quitter, M.D. Fax: [phone]  Electronically Signed by Hartley Barefoot MD on 08/31/2011 08:47:35 PM

## 2011-09-13 ENCOUNTER — Encounter: Payer: Self-pay | Admitting: Pulmonary Disease

## 2011-09-13 ENCOUNTER — Ambulatory Visit (INDEPENDENT_AMBULATORY_CARE_PROVIDER_SITE_OTHER): Payer: Medicaid Other | Admitting: Pulmonary Disease

## 2011-09-13 VITALS — BP 126/86 | HR 83 | Temp 97.7°F | Ht 66.0 in | Wt >= 6400 oz

## 2011-09-13 DIAGNOSIS — E662 Morbid (severe) obesity with alveolar hypoventilation: Secondary | ICD-10-CM

## 2011-09-13 DIAGNOSIS — R918 Other nonspecific abnormal finding of lung field: Secondary | ICD-10-CM

## 2011-09-13 NOTE — Progress Notes (Signed)
  Subjective:    Patient ID: [name], female    DOB: 1966/09/04, 45 y.o.   MRN: [id number]  HPI [name]  45 yo morbidly obese female seen in hospital 01/26/11 for pulmonary consult for acute dyspnea and hypoxia found to have OHS and OSA with recs for continuous O2 ( 2 L/m at rest & 4L/m on walking)and Nocturnal CPAP  She underwent extensive work up with echo showing mild LVH , EF 55-60% . VQ scan intermed prob , doppler neg. Subsequent CT chest was neg for PE.   4/12 -Stopped zestoretic . Had considered gastric bypass 5 yrs ago but did not want laparotomy.   6/12 - BP high -started on losartan -HCTZ  CPAP titration 5/12 (wt 479 lbs) showed CPAP 7 cm to stop snoring, No desaturation ON O2 2 L/min  satn 95% RA   09/13/2011  Hosp adm for hemoptysis & BL infiltrates on CT .  p-anca pos 1.80, gbm neg, ANA neg, ESR 42 Labs c/w fe def anemia, UA pos blood (was having periods) Was poorly compliant with CPAP since could not lie on her back due to anorectal abscess surgery - now is using every night again. Hemoptysis has subsided x 1 week Also endometrial ablation is planned for menorrhagia  CBC    Component Value Date/Time   WBC 8.7 08/26/2011 0428   RBC 4.58 08/26/2011 0428   HGB 8.7* 08/26/2011 0428   HCT 31.1* 08/26/2011 0428   PLT 337 08/26/2011 0428   MCV 67.9* 08/26/2011 0428   MCH 19.0* 08/26/2011 0428   MCHC 28.0* 08/26/2011 0428   RDW 19.4* 08/26/2011 0428   LYMPHSABS 1.5 08/23/2011 0025   MONOABS 1.0 08/23/2011 0025   EOSABS 0.4 08/23/2011 0025   BASOSABS 0.0 08/23/2011 0025    BMET    Component Value Date/Time   NA 136 08/26/2011 0428   K 4.4 08/26/2011 0428   CL 98 08/26/2011 0428   CO2 32 08/26/2011 0428   GLUCOSE 92 08/26/2011 0428   BUN 21 08/26/2011 0428   CREATININE 0.77 08/26/2011 0428   CALCIUM 8.9 08/26/2011 0428   GFRNONAA >90 08/26/2011 0428   GFRAA >90 08/26/2011 0428      Review of Systems Pt denies any significant   nasal congestion or excess secretions, fever, chills, sweats, unintended wt loss, pleuritic or exertional cp, orthopnea pnd or leg swelling.  Pt also denies any obvious fluctuation in symptoms with weather or environmental change or other alleviating or aggravating factors.    Pt denies any increase in rescue therapy over baseline, denies waking up needing it or having early am exacerbations or coughing/wheezing/ or dyspnea      Objective:   Physical Exam  Gen. Pleasant, obese, in no distress, normal affect ENT - no lesions, no post nasal drip, class 2-3 airway Neck: No JVD, no thyromegaly, no carotid bruits Lungs: no use of accessory muscles, no dullness to percussion, decreased without rales or rhonchi  Cardiovascular: Rhythm regular, heart sounds  normal, no murmurs or gallops, no peripheral edema Abdomen: soft and non-tender, no hepatosplenomegaly, BS normal. Musculoskeletal: No deformities, no cyanosis or clubbing Neuro:  alert, non focal, no tremors       Assessment & Plan:

## 2011-09-13 NOTE — Patient Instructions (Signed)
Bronchoscopy scheduled for Monday nov 26th at 8am Nothing to eat after midnight the night before Risks discussed

## 2011-09-13 NOTE — Assessment & Plan Note (Signed)
AT moderate risk for surgery given morbid obesity & pulmonary issues Would recommend spinal anesthesia .

## 2011-09-13 NOTE — Assessment & Plan Note (Signed)
DD of hemoptysis/ESR 42, pos p-ANCA : sarcoid, pulm renal syndromes. - doubt active urine sediment since she was having periods then. WIll proceed with bronchoscopy The risks of the procedure including coughing, bleeding and the small chance of lung puncture requiring chest tube were discussed in great detail. The benefits & alternatives including serial follow up were also discussed.

## 2011-09-18 ENCOUNTER — Telehealth: Payer: Self-pay | Admitting: Pulmonary Disease

## 2011-09-18 NOTE — Telephone Encounter (Signed)
error 

## 2011-09-26 ENCOUNTER — Ambulatory Visit (HOSPITAL_COMMUNITY): Payer: Medicaid Other

## 2011-09-26 ENCOUNTER — Encounter (HOSPITAL_COMMUNITY)
Admission: RE | Disposition: A | Discharge status: Home / Self Care | Payer: Self-pay | Source: Ambulatory Visit | Attending: Pulmonary Disease

## 2011-09-26 ENCOUNTER — Telehealth: Payer: Self-pay | Admitting: Pulmonary Disease

## 2011-09-26 ENCOUNTER — Ambulatory Visit (HOSPITAL_COMMUNITY)
Admission: RE | Admit: 2011-09-26 | Discharge: 2011-09-26 | Disposition: A | Discharge status: Home / Self Care | Payer: Medicaid Other | Source: Ambulatory Visit | Attending: Pulmonary Disease | Admitting: Pulmonary Disease

## 2011-09-26 ENCOUNTER — Encounter (HOSPITAL_COMMUNITY): Payer: Self-pay | Admitting: Radiology

## 2011-09-26 DIAGNOSIS — R042 Hemoptysis: Secondary | ICD-10-CM

## 2011-09-26 DIAGNOSIS — R918 Other nonspecific abnormal finding of lung field: Secondary | ICD-10-CM

## 2011-09-26 DIAGNOSIS — G4733 Obstructive sleep apnea (adult) (pediatric): Secondary | ICD-10-CM | POA: Insufficient documentation

## 2011-09-26 SURGERY — BRONCHOSCOPY, WITH FLUOROSCOPY
Anesthesia: Moderate Sedation | Laterality: Bilateral

## 2011-09-26 MED ORDER — MIDAZOLAM HCL 10 MG/2ML IJ SOLN
4.0000 mg | Freq: Once | INTRAMUSCULAR | Status: AC
  Administered 2011-09-26 (×5): 1 mg via INTRAVENOUS

## 2011-09-26 MED ORDER — PHENYLEPHRINE HCL 0.25 % NA SOLN
1.0000 | Freq: Four times a day (QID) | NASAL | Status: DC | PRN
  Administered 2011-09-26: 1 via NASAL

## 2011-09-26 MED ORDER — FENTANYL CITRATE 0.05 MG/ML IJ SOLN
INTRAMUSCULAR | Status: AC
  Filled 2011-09-26: qty 4

## 2011-09-26 MED ORDER — FENTANYL CITRATE 0.05 MG/ML IJ SOLN
100.0000 ug | Freq: Once | INTRAMUSCULAR | Status: AC
  Administered 2011-09-26 (×5): 25 ug via INTRAVENOUS

## 2011-09-26 MED ORDER — MIDAZOLAM HCL 10 MG/2ML IJ SOLN
INTRAMUSCULAR | Status: AC
  Filled 2011-09-26: qty 4

## 2011-09-26 MED ORDER — LIDOCAINE HCL 2 % EX GEL
Freq: Once | CUTANEOUS | Status: AC
  Administered 2011-09-26: 07:00:00 via TOPICAL

## 2011-09-26 MED ORDER — LIDOCAINE HCL (PF) 1 % IJ SOLN
6.0000 mL | Freq: Once | INTRAMUSCULAR | Status: AC
  Administered 2011-09-26: 6 mL

## 2011-09-26 MED ORDER — FENTANYL CITRATE 0.05 MG/ML IJ SOLN: 25.0000 ug | Freq: Once | INTRAMUSCULAR | Status: DC

## 2011-09-26 MED ORDER — FENTANYL CITRATE 0.05 MG/ML IJ SOLN: 100.0000 ug | Freq: Once | INTRAMUSCULAR | Status: DC

## 2011-09-26 MED ORDER — BUTAMBEN-TETRACAINE-BENZOCAINE 2-2-14 % EX AERO: 1.0000 | INHALATION_SPRAY | Freq: Once | CUTANEOUS | Status: DC

## 2011-09-26 MED ORDER — MIDAZOLAM HCL 2 MG/2ML IJ SOLN: 1.0000 mg | Freq: Once | INTRAMUSCULAR | Status: DC

## 2011-09-26 NOTE — Interval H&P Note (Signed)
History and Physical Interval Note:   09/26/2011   8:40 AM   [name]  has presented today for surgery, with the diagnosis of Pulmonary infilitrates  The various methods of treatment have been discussed with the patient and family. After consideration of risks, benefits and other options for treatment, the patient has consented to  Procedure(s): VIDEO BRONCHOSCOPY WITH FLUORO as a surgical intervention .  The patients' history has been reviewed, patient examined, no change in status, stable for surgery.  I have reviewed the patients' chart and labs.  Questions were answered to the patient's satisfaction.   No interval changes, NO hemoptysis  Oretha Milch.  MD

## 2011-09-26 NOTE — Telephone Encounter (Signed)
Ok to [employer] any day 1-2 weeks

## 2011-09-26 NOTE — Progress Notes (Signed)
Patient discharged to home accompanied by sister and son.Patient taken to car by wheelchair. No distress noted. All questions answered.

## 2011-09-26 NOTE — Telephone Encounter (Signed)
Per Shanda Bumps send to Dr. Vassie Loll to see if we can over book for pt to come. Please advise Dr. Vassie Loll, thanks

## 2011-09-26 NOTE — Telephone Encounter (Signed)
I spoke with pt and she states she had lung biopsy today and was advised she needs 2 week f/u with RA. Please advise a work in spot for [name], thanks

## 2011-09-26 NOTE — Op Note (Signed)
Indication: Bilateral unexplained pulmonary infiltrates & hemoptysis  in the 45 -year-old non smoker. pANCA pos  Written informed consent was obtained from the patient prior to the procedure. The risks of the procedure including coughing, bleeding and a small chance of lung cancer requiring a chest tube were discussed with the patient in great detail and evidenced understanding.  5 mg of Versed and  125 mcg of fentanyl were used in divided doses during the procedure. Bronchoscope was inserted from the right Nare. The upper airway appeared normal. Vocal cord showed normal appearance in motion. The trachea bronchial tree was then examined to the subsegmental level. No secretions were noted. No endobronchial lesions were noted. The mucosa appeared inflamed & was friable to touch.  Attention was then turned to the right lower lobe. Bronchoalveolar lavage was obtained from the right lower lobe with good return. Transbronchial biopsies x4 were obtained from the different subsegments of the right lower lobe. The patient tolerated procedure well with minimal bleeding.  A portable chest XR. will be performed to rule out presence of pneumothorax. SHe was awake and alert in the end of the procedure.

## 2011-09-26 NOTE — H&P (View-Only) (Signed)
  Subjective:    Patient ID: [name], female    DOB: [birth year], 45 y.o.   MRN: [id number]  HPI [name]  45 yo morbidly obese female seen in hospital 01/26/11 for pulmonary consult for acute dyspnea and hypoxia found to have OHS and OSA with recs for continuous O2 ( 2 L/m at rest & 4L/m on walking)and Nocturnal CPAP  She underwent extensive work up with echo showing mild LVH , EF 55-60% . VQ scan intermed prob , doppler neg. Subsequent CT chest was neg for PE.   4/12 -Stopped zestoretic . Had considered gastric bypass 5 yrs ago but did not want laparotomy.   6/12 - BP high -started on losartan -HCTZ  CPAP titration 5/12 (wt 479 lbs) showed CPAP 7 cm to stop snoring, No desaturation ON O2 2 L/min  satn 95% RA   09/13/2011  Hosp adm for hemoptysis & BL infiltrates on CT .  p-anca pos 1.80, gbm neg, ANA neg, ESR 42 Labs c/w fe def anemia, UA pos blood (was having periods) Was poorly compliant with CPAP since could not lie on her back due to anorectal abscess surgery - now is using every night again. Hemoptysis has subsided x 1 week Also endometrial ablation is planned for menorrhagia  CBC    Component Value Date/Time   WBC 8.7 08/26/2011 0428   RBC 4.58 08/26/2011 0428   HGB 8.7* 08/26/2011 0428   HCT 31.1* 08/26/2011 0428   PLT 337 08/26/2011 0428   MCV 67.9* 08/26/2011 0428   MCH 19.0* 08/26/2011 0428   MCHC 28.0* 08/26/2011 0428   RDW 19.4* 08/26/2011 0428   LYMPHSABS 1.5 08/23/2011 0025   MONOABS 1.0 08/23/2011 0025   EOSABS 0.4 08/23/2011 0025   BASOSABS 0.0 08/23/2011 0025    BMET    Component Value Date/Time   NA 136 08/26/2011 0428   K 4.4 08/26/2011 0428   CL 98 08/26/2011 0428   CO2 32 08/26/2011 0428   GLUCOSE 92 08/26/2011 0428   BUN 21 08/26/2011 0428   CREATININE 0.77 08/26/2011 0428   CALCIUM 8.9 08/26/2011 0428   GFRNONAA >90 08/26/2011 0428   GFRAA >90 08/26/2011 0428      Review of Systems Pt denies any significant   nasal congestion or excess secretions, fever, chills, sweats, unintended wt loss, pleuritic or exertional cp, orthopnea pnd or leg swelling.  Pt also denies any obvious fluctuation in symptoms with weather or environmental change or other alleviating or aggravating factors.    Pt denies any increase in rescue therapy over baseline, denies waking up needing it or having early am exacerbations or coughing/wheezing/ or dyspnea      Objective:   Physical Exam  Gen. Pleasant, obese, in no distress, normal affect ENT - no lesions, no post nasal drip, class 2-3 airway Neck: No JVD, no thyromegaly, no carotid bruits Lungs: no use of accessory muscles, no dullness to percussion, decreased without rales or rhonchi  Cardiovascular: Rhythm regular, heart sounds  normal, no murmurs or gallops, no peripheral edema Abdomen: soft and non-tender, no hepatosplenomegaly, BS normal. Musculoskeletal: No deformities, no cyanosis or clubbing Neuro:  alert, non focal, no tremors       Assessment & Plan:

## 2011-09-27 NOTE — Telephone Encounter (Signed)
Pt set to see RA on 12--3-12. Carron Curie, CMA

## 2011-09-29 ENCOUNTER — Encounter (HOSPITAL_COMMUNITY): Payer: Self-pay

## 2011-10-03 ENCOUNTER — Encounter: Payer: Self-pay | Admitting: Pulmonary Disease

## 2011-10-03 ENCOUNTER — Ambulatory Visit (INDEPENDENT_AMBULATORY_CARE_PROVIDER_SITE_OTHER): Payer: Medicaid Other | Admitting: Pulmonary Disease

## 2011-10-03 VITALS — BP 144/82 | HR 89 | Temp 98.4°F | Ht 66.0 in | Wt >= 6400 oz

## 2011-10-03 DIAGNOSIS — G4733 Obstructive sleep apnea (adult) (pediatric): Secondary | ICD-10-CM

## 2011-10-03 DIAGNOSIS — R918 Other nonspecific abnormal finding of lung field: Secondary | ICD-10-CM

## 2011-10-03 NOTE — Patient Instructions (Signed)
You may have inflammation in the lung due to sarcoidosis or other condition Your biopsy was indeterminate and did not show infection Blood work & urine test today If  Remains positive, consultation with rheumatology OK to proceed with surgery - if biger procedure planned , have your GYN contact me please

## 2011-10-03 NOTE — Assessment & Plan Note (Signed)
Ct CPAP - she assures me of better compliance OK to stay off O2 She would be at moderate risk for surgical procedure of any kind  Would use CPAP post op hysterectomy or ablation procedure

## 2011-10-03 NOTE — Progress Notes (Signed)
  Subjective:    Patient ID: [name], female    DOB: [birth year], 45 y.o.   MRN: [id number]  HPI [name]   45 yo morbidly obese female seen in hospital 01/26/11 for pulmonary consult for acute dyspnea and hypoxia found to have OHS and OSA with recs for continuous O2 ( 2 L/m at rest & 4L/m on walking)and Nocturnal CPAP  She underwent extensive work up with echo showing mild LVH , EF 55-60% . VQ scan intermed prob , doppler neg. Subsequent CT chest was neg for PE.  4/12 -Stopped zestoretic . Had considered gastric bypass 5 yrs ago but did not want laparotomy.  6/12 - BP high -started on losartan -HCTZ  CPAP titration 5/12 (wt 479 lbs) showed CPAP 7 cm to stop snoring, No desaturation ON O2 2 L/min  satn 95% RA   09/13/2011  Hosp adm for hemoptysis & BL infiltrates on CT .  p-anca pos 1.80, gbm neg, ANA neg, ESR 42  Labs c/w fe def anemia, UA pos blood (was having periods) >> rpt UA no RBCs, pANCA pos Was poorly compliant with CPAP since could not lie on her back due to anorectal abscess surgery - now is using every night again.  Hemoptysis has subsided x 1 week  Also endometrial ablation is planned for menorrhagia  10/03/2011 No further hemoptysis TBBX >> no granulomas, BAL neg afb, fungal She is feeling good, hypoxia has resolved.    Review of Systems Patient denies significant dyspnea,cough, hemoptysis,  chest pain, palpitations, pedal edema, orthopnea, paroxysmal nocturnal dyspnea, lightheadedness, nausea, vomiting, abdominal or  leg pains      Objective:   Physical Exam Gen. Pleasant, obese, in no distress, normal affect ENT - no lesions, no post nasal drip, class 2-3 airway Neck: No JVD, no thyromegaly, no carotid bruits Lungs: no use of accessory muscles, no dullness to percussion, decreased without rales or rhonchi  Cardiovascular: Rhythm regular, heart sounds  normal, no murmurs or gallops, no peripheral edema Abdomen: soft and non-tender, no  hepatosplenomegaly, BS normal. Musculoskeletal: No deformities, no cyanosis or clubbing Neuro:  alert, non focal, no tremors        Assessment & Plan:

## 2011-10-03 NOTE — Assessment & Plan Note (Signed)
DD of hemoptysis/ESR 42, pos p-ANCA : favor sarcoid over  pulm renal syndromes , inspite of neg TBBX Rpt urine sediment, ANCA & ACE level If ANCA remains pos, consider rheum input. Otherwise will follow She is a poor candidate for surgical biopsy or empiric steroids

## 2011-10-05 ENCOUNTER — Other Ambulatory Visit (HOSPITAL_COMMUNITY): Payer: Self-pay | Admitting: Family Medicine

## 2011-10-05 DIAGNOSIS — D219 Benign neoplasm of connective and other soft tissue, unspecified: Secondary | ICD-10-CM

## 2011-10-06 ENCOUNTER — Other Ambulatory Visit (HOSPITAL_COMMUNITY): Payer: Self-pay | Admitting: Obstetrics and Gynecology

## 2011-10-06 DIAGNOSIS — N920 Excessive and frequent menstruation with regular cycle: Secondary | ICD-10-CM

## 2011-10-06 SURGERY — BRONCHOSCOPY, WITH FLUOROSCOPY
Anesthesia: Moderate Sedation | Laterality: Bilateral

## 2011-10-07 ENCOUNTER — Other Ambulatory Visit (HOSPITAL_COMMUNITY): Payer: Medicaid Other

## 2011-10-12 ENCOUNTER — Ambulatory Visit (HOSPITAL_COMMUNITY)
Admission: RE | Admit: 2011-10-12 | Discharge: 2011-10-12 | Disposition: A | Discharge status: Home / Self Care | Payer: Medicaid Other | Source: Ambulatory Visit | Attending: Obstetrics and Gynecology | Admitting: Obstetrics and Gynecology

## 2011-10-12 DIAGNOSIS — N920 Excessive and frequent menstruation with regular cycle: Secondary | ICD-10-CM | POA: Insufficient documentation

## 2011-10-26 LAB — FUNGUS CULTURE W SMEAR

## 2011-11-08 LAB — AFB CULTURE WITH SMEAR (NOT AT ARMC): Acid Fast Smear: NONE SEEN

## 2011-12-01 ENCOUNTER — Telehealth: Payer: Self-pay | Admitting: Pulmonary Disease

## 2011-12-01 NOTE — Telephone Encounter (Signed)
Will forward message to RA so he can contact Dr. Richardson Dopp

## 2011-12-01 NOTE — Telephone Encounter (Signed)
Spoke to Dr Richardson Dopp - hysteroscopy planned Spinal ok, anesthesia consul, CPAP for OSA Pl obtain ANA, ANCA report from [facility]

## 2011-12-07 NOTE — Telephone Encounter (Signed)
I called [facility] and they advised now records of pt having labs.

## 2011-12-19 ENCOUNTER — Encounter (HOSPITAL_COMMUNITY): Payer: Self-pay | Admitting: Pharmacist

## 2011-12-20 ENCOUNTER — Encounter (HOSPITAL_COMMUNITY): Payer: Self-pay

## 2011-12-20 ENCOUNTER — Encounter (HOSPITAL_COMMUNITY)
Admission: RE | Admit: 2011-12-20 | Discharge: 2011-12-20 | Disposition: A | Discharge status: Home / Self Care | Payer: Medicaid Other | Source: Ambulatory Visit | Attending: Obstetrics and Gynecology | Admitting: Obstetrics and Gynecology

## 2011-12-20 HISTORY — DX: Unspecified osteoarthritis, unspecified site: M19.90

## 2011-12-20 HISTORY — DX: Chronic obstructive pulmonary disease, unspecified: J44.9

## 2011-12-20 HISTORY — DX: Pneumonia, unspecified organism: J18.9

## 2011-12-20 HISTORY — DX: Other somatoform disorders: F45.8

## 2011-12-20 HISTORY — DX: Other seasonal allergic rhinitis: J30.2

## 2011-12-20 HISTORY — DX: Personal history of other diseases of the digestive system: Z87.19

## 2011-12-20 HISTORY — DX: Alkalosis: E87.3

## 2011-12-20 LAB — CBC
Hemoglobin: 10.9 g/dL — ABNORMAL LOW (ref 12.0–15.0)
Platelets: 253 10*3/uL (ref 150–400)
RBC: 5.37 MIL/uL — ABNORMAL HIGH (ref 3.87–5.11)
WBC: 5.3 10*3/uL (ref 4.0–10.5)

## 2011-12-20 LAB — BASIC METABOLIC PANEL
Calcium: 8.8 mg/dL (ref 8.4–10.5)
GFR calc Af Amer: >90 mL/min (ref >90)
GFR calc non Af Amer: >90 mL/min (ref >90)
Potassium: 3.8 mEq/L (ref 3.5–5.1)
Sodium: 136 mEq/L (ref 135–145)

## 2011-12-20 NOTE — Pre-Procedure Instructions (Signed)
Dr foster to see patient at pre-op appt

## 2011-12-20 NOTE — Patient Instructions (Addendum)
   Your procedure is scheduled on: Tuesday, Feb 26th  Enter through the Main Entrance of [facility] at:12 noon Pick up the phone at the desk and dial [phone] and inform us of your arrival.  Please call this number if you have any problems the morning of surgery: [phone]  Remember: Do not eat food after midnight: Monday Do not drink clear liquids after: 9:30am Tuesday Take these medicines the morning of surgery with a SIP OF WATER: per anesthesia instructions  Do not wear jewelry, make-up, or FINGER nail polish Do not wear lotions, powders, perfumes or deodorant. Do not shave 48 hours prior to surgery. Do not bring valuables to the hospital.  Patients discharged on the day of surgery will not be allowed to drive home.   Home with Son [name]  cell  [phone]   Remember to use your hibiclens as instructed.Please shower with 1/2 bottle the evening before your surgery and the other 1/2 bottle the morning of surgery.

## 2011-12-20 NOTE — Anesthesia Preprocedure Evaluation (Signed)
Anesthesia Evaluation    Airway Mallampati: III TM Distance: >3 FB Neck ROM: Full    Dental No notable dental hx. (+) Teeth Intact   Pulmonary shortness of breath, with exertion, at rest, lying and Long-Term Oxygen Therapy, sleep apnea, Continuous Positive Airway Pressure Ventilation and Oxygen sleep apnea , pneumonia , COPD COPD inhaler and oxygen dependent,  Pickwickian Syndrome clear to auscultation  Pulmonary exam normal       Cardiovascular hypertension, Pt. on medications Regular Normal    Neuro/Psych    GI/Hepatic Neg liver ROS, hiatal hernia, GERD-  ,Off Rx for GERD for 4-5 months   Endo/Other  Hypothyroidism Morbid obesity  Renal/GU negative Renal ROS  Genitourinary negative   Musculoskeletal  (+) Arthritis -, Osteoarthritis,    Abdominal (+) obese,  Abdomen: soft.    Peds  Hematology negative hematology ROS (+)   Anesthesia Other Findings   Reproductive/Obstetrics negative OB ROS                           Anesthesia Physical Anesthesia Plan  ASA: III  Anesthesia Plan: Regional and Spinal   Post-op Pain Management:    Induction:   Airway Management Planned: Natural Airway  Additional Equipment:   Intra-op Plan:   Post-operative Plan:   Informed Consent: I have reviewed the patients History and Physical, chart, labs and discussed the procedure including the risks, benefits and alternatives for the proposed anesthesia with the patient or authorized representative who has indicated his/her understanding and acceptance.   Dental advisory given  Plan Discussed with: CRNA, Anesthesiologist and Surgeon  Anesthesia Plan Comments:         Anesthesia Quick Evaluation

## 2011-12-26 ENCOUNTER — Other Ambulatory Visit: Payer: Self-pay | Admitting: Obstetrics and Gynecology

## 2011-12-26 NOTE — Telephone Encounter (Signed)
Called [facility], no recent labs - last labs were ANCA was Oct 2012.  Pt has lab orders for ANCA and ANA dated 10-03-2011 thru [facility].  Called [place], no recent labs - last labs were Nov 2012 but no ANCA.  Dr Vassie Loll please advise, thanks.

## 2011-12-26 NOTE — Telephone Encounter (Signed)
Pl get ANCA from oct '12

## 2011-12-27 ENCOUNTER — Ambulatory Visit (HOSPITAL_COMMUNITY)
Admission: RE | Admit: 2011-12-27 | Discharge: 2011-12-27 | Disposition: A | Discharge status: Home / Self Care | Payer: Medicaid Other | Source: Ambulatory Visit | Attending: Obstetrics and Gynecology | Admitting: Obstetrics and Gynecology

## 2011-12-27 ENCOUNTER — Encounter (HOSPITAL_COMMUNITY)
Admission: RE | Disposition: A | Discharge status: Home / Self Care | Payer: Self-pay | Source: Ambulatory Visit | Attending: Obstetrics and Gynecology

## 2011-12-27 ENCOUNTER — Encounter (HOSPITAL_COMMUNITY): Payer: Self-pay | Admitting: Anesthesiology

## 2011-12-27 ENCOUNTER — Ambulatory Visit (HOSPITAL_COMMUNITY): Payer: Medicaid Other | Admitting: Anesthesiology

## 2011-12-27 ENCOUNTER — Encounter (HOSPITAL_COMMUNITY): Payer: Self-pay | Admitting: *Deleted

## 2011-12-27 DIAGNOSIS — Z9889 Other specified postprocedural states: Secondary | ICD-10-CM

## 2011-12-27 DIAGNOSIS — N920 Excessive and frequent menstruation with regular cycle: Secondary | ICD-10-CM | POA: Insufficient documentation

## 2011-12-27 DIAGNOSIS — Z01812 Encounter for preprocedural laboratory examination: Secondary | ICD-10-CM | POA: Insufficient documentation

## 2011-12-27 DIAGNOSIS — Z01818 Encounter for other preprocedural examination: Secondary | ICD-10-CM | POA: Insufficient documentation

## 2011-12-27 HISTORY — PX: HYSTEROSCOPY WITH D & C: SHX1775

## 2011-12-27 LAB — HCG, SERUM, QUALITATIVE: Preg, Serum: NEGATIVE

## 2011-12-27 SURGERY — DILATATION & CURETTAGE/HYSTEROSCOPY WITH HYDROTHERMAL ABLATION
Anesthesia: Spinal | Wound class: Clean Contaminated

## 2011-12-27 MED ORDER — ONDANSETRON HCL 4 MG/2ML IJ SOLN
INTRAMUSCULAR | Status: AC
  Filled 2011-12-27: qty 2

## 2011-12-27 MED ORDER — SILVER SULFADIAZINE 1 % EX CREA
TOPICAL_CREAM | CUTANEOUS | Status: AC
  Filled 2011-12-27: qty 50

## 2011-12-27 MED ORDER — KETOROLAC TROMETHAMINE 60 MG/2ML IM SOLN
INTRAMUSCULAR | Status: AC
  Filled 2011-12-27: qty 2

## 2011-12-27 MED ORDER — DEXAMETHASONE SODIUM PHOSPHATE 10 MG/ML IJ SOLN
INTRAMUSCULAR | Status: AC
  Filled 2011-12-27: qty 1

## 2011-12-27 MED ORDER — PHENYLEPHRINE HCL 10 MG/ML IJ SOLN
INTRAMUSCULAR | Status: DC | PRN
  Administered 2011-12-27: 80 ug via INTRAVENOUS
  Administered 2011-12-27 (×2): 40 ug via INTRAVENOUS

## 2011-12-27 MED ORDER — EPHEDRINE 5 MG/ML INJ
INTRAVENOUS | Status: AC
  Filled 2011-12-27: qty 10

## 2011-12-27 MED ORDER — GLYCINE 1.5 % IR SOLN
Status: DC | PRN
  Administered 2011-12-27: 3000 mL

## 2011-12-27 MED ORDER — MIDAZOLAM HCL 2 MG/2ML IJ SOLN
INTRAMUSCULAR | Status: AC
  Filled 2011-12-27: qty 2

## 2011-12-27 MED ORDER — BUPIVACAINE HCL (PF) 0.25 % IJ SOLN
INTRAMUSCULAR | Status: DC | PRN
  Administered 2011-12-27: 20 mL

## 2011-12-27 MED ORDER — SILVER SULFADIAZINE 1 % EX CREA
TOPICAL_CREAM | CUTANEOUS | Status: DC | PRN
  Administered 2011-12-27: 1 via TOPICAL

## 2011-12-27 MED ORDER — PHENYLEPHRINE 40 MCG/ML (10ML) SYRINGE FOR IV PUSH (FOR BLOOD PRESSURE SUPPORT)
PREFILLED_SYRINGE | INTRAVENOUS | Status: AC
  Filled 2011-12-27: qty 5

## 2011-12-27 MED ORDER — PROPOFOL 10 MG/ML IV EMUL
INTRAVENOUS | Status: AC
  Filled 2011-12-27: qty 20

## 2011-12-27 MED ORDER — GLYCOPYRROLATE 0.2 MG/ML IJ SOLN
INTRAMUSCULAR | Status: AC
  Filled 2011-12-27: qty 1

## 2011-12-27 MED ORDER — FENTANYL CITRATE 0.05 MG/ML IJ SOLN
INTRAMUSCULAR | Status: DC | PRN
  Administered 2011-12-27 (×2): 50 ug via INTRAVENOUS

## 2011-12-27 MED ORDER — BUPIVACAINE HCL (PF) 0.25 % IJ SOLN
INTRAMUSCULAR | Status: AC
  Filled 2011-12-27: qty 30

## 2011-12-27 MED ORDER — ONDANSETRON HCL 4 MG/2ML IJ SOLN
INTRAMUSCULAR | Status: DC | PRN
  Administered 2011-12-27: 4 mg via INTRAVENOUS

## 2011-12-27 MED ORDER — FENTANYL CITRATE 0.05 MG/ML IJ SOLN
INTRAMUSCULAR | Status: AC
  Filled 2011-12-27: qty 2

## 2011-12-27 MED ORDER — DOXYCYCLINE HYCLATE 100 MG PO TABS: ORAL_TABLET | ORAL | Status: DC

## 2011-12-27 MED ORDER — LIDOCAINE HCL (CARDIAC) 20 MG/ML IV SOLN
INTRAVENOUS | Status: AC
  Filled 2011-12-27: qty 5

## 2011-12-27 MED ORDER — IBUPROFEN 200 MG PO TABS: 600.0000 mg | ORAL_TABLET | Freq: Four times a day (QID) | ORAL | Status: AC | PRN

## 2011-12-27 MED ORDER — HYDROCODONE-ACETAMINOPHEN 5-500 MG PO TABS: ORAL_TABLET | ORAL | Status: DC

## 2011-12-27 MED ORDER — MIDAZOLAM HCL 5 MG/5ML IJ SOLN
INTRAMUSCULAR | Status: DC | PRN
  Administered 2011-12-27 (×2): 1 mg via INTRAVENOUS

## 2011-12-27 MED ORDER — LIDOCAINE-EPINEPHRINE (PF) 2 %-1:200000 IJ SOLN
INTRAMUSCULAR | Status: AC
  Filled 2011-12-27: qty 20

## 2011-12-27 MED ORDER — LACTATED RINGERS IV SOLN
INTRAVENOUS | Status: DC
  Administered 2011-12-27 (×2): via INTRAVENOUS

## 2011-12-27 SURGICAL SUPPLY — 16 items
CANISTER SUCTION 2500CC (MISCELLANEOUS) ×3 IMPLANT
CATH ROBINSON RED A/P 16FR (CATHETERS) ×3 IMPLANT
CATH THERMACHOICE III (CATHETERS) ×2 IMPLANT
CLOTH BEACON ORANGE TIMEOUT ST (SAFETY) ×3 IMPLANT
CONTAINER PREFILL 10% NBF 60ML (FORM) ×4 IMPLANT
ELECT REM PT RETURN 9FT ADLT (ELECTROSURGICAL)
ELECTRODE REM PT RTRN 9FT ADLT (ELECTROSURGICAL) IMPLANT
GLOVE BIOGEL M 6.5 STRL (GLOVE) ×3 IMPLANT
GLOVE BIOGEL PI IND STRL 6.5 (GLOVE) ×4 IMPLANT
GLOVE BIOGEL PI INDICATOR 6.5 (GLOVE) ×2
GOWN PREVENTION PLUS LG XLONG (DISPOSABLE) ×3 IMPLANT
GOWN STRL REIN XL XLG (GOWN DISPOSABLE) ×3 IMPLANT
PACK HYSTEROSCOPY LF (CUSTOM PROCEDURE TRAY) ×3 IMPLANT
SET GENESYS HTA PROCERVA (MISCELLANEOUS) ×3 IMPLANT
TOWEL OR 17X24 6PK STRL BLUE (TOWEL DISPOSABLE) ×6 IMPLANT
WATER STERILE IRR 1000ML POUR (IV SOLUTION) ×3 IMPLANT

## 2011-12-27 NOTE — Telephone Encounter (Signed)
Will forward to [place] R to track down results, thanks

## 2011-12-27 NOTE — Transfer of Care (Signed)
Immediate Anesthesia Transfer of Care Note  Patient: [name]  Procedure(s) Performed: Procedure(s) (LRB): DILATATION & CURETTAGE/HYSTEROSCOPY WITH HYDROTHERMAL ABLATION (N/A) DILATATION AND CURETTAGE /HYSTEROSCOPY ()  Patient Location: PACU  Anesthesia Type: Spinal  Level of Consciousness: awake  Airway & Oxygen Therapy: Patient Spontanous Breathing  Post-op Assessment: Report given to PACU RN  Post vital signs: Reviewed and stable  Complications: No apparent anesthesia complications

## 2011-12-27 NOTE — Op Note (Signed)
12/27/2011  4:44 PM  PATIENT:  [name]  46 y.o. female  PRE-OPERATIVE DIAGNOSIS:  Menorrhagia  POST-OPERATIVE DIAGNOSIS:  Menorrhagia  PROCEDURE:  Procedure(s) (LRB): DILATATION & CURETTAGE/HYSTEROSCOPY WITH HYDROTHERMAL ABLATION (N/A) DILATATION AND CURETTAGE /HYSTEROSCOPY ()  SURGEON:  Surgeon(s) and Role:    * Xee Hollman J. Richardson Dopp, MD - Primary  PHYSICIAN ASSISTANT:   ASSISTANTS: none   ANESTHESIA:   spinal  EBL:50 cc  Total I/O In: 1400 [I.V.:1400] Out: 30 [Urine:5; Blood:25]  BLOOD ADMINISTERED:none  DRAINS: none   LOCAL MEDICATIONS USED:  MARCAINE   20 cc   SPECIMEN:  Source of Specimen:  endometrial currettings  DISPOSITION OF SPECIMEN:  PATHOLOGY  COUNTS:  YES  TOURNIQUET:  * No tourniquets in log *  DICTATION: .Other Dictation: Dictation Number Dragon dictation   PLAN OF CARE: Discharge to home after PACU  PATIENT DISPOSITION:  PACU - hemodynamically stable.   Delay start of Pharmacological VTE agent (>24hrs) due to surgical blood loss or risk of bleeding: not applicable   Procedure: Patient was taken to the operating room where spinal anesthetic was placed it was found to be adequate. She was placed in dorsal lithotomy position. A Pap smear was performed. An exam under anesthesia was performed this was limited by the patient's body habitus.  Patient was then prepped and draped in the usual sterile fashion. A speculum placed in the vaginal vault. The cervix was grasped with a single-tooth tenaculum. Cervix was sounded to approximately 8 cm. Quarter percent Marcaine was injected at the 4 and 8:00 positions. Cervix then dilated to 8 mm. The HTA ablation scope was placed. These uterus was noted to be very anteverted and complete insertion of the scope was limited by the patient's the vaginal vault and buttocks. Due to limited visualization the HTA scope was removed. And a diagnostic hysteroscope was inserted. Patient was noted to have some proliferation  of her endometrium. Visualization was impaired by  poor distention. The hysteroscope was removed the HTA scope was then reinserted the HTA ablation was initiated however an adequate seal was not achieved. Patient was noted to have leaking of the fluid from the cervix. The HTA scope was then repositioned. The Enseal was still in adequate. The HTA ablation device indicated fluid loss. At this point the attempt at ablation was aborted.  The diagnostic hysteroscope was then reinserted there was no evidence of perforation noted. The hysteroscope was removed and a sharp curettage was performed. The scrapings were sent to pathology. Single-tooth tenaculum was removed from the cervix excellent hemostasis was noted.  Silvadene cream was placed into the vaginal vault. Sponge lap and needle counts were correct x2. Patient was taken to recovery room awake and in stable condition.  Glycine deficit was 150 cc.

## 2011-12-27 NOTE — Anesthesia Postprocedure Evaluation (Signed)
Anesthesia Post Note  Patient: [name]  Procedure(s) Performed: Procedure(s) (LRB): DILATATION & CURETTAGE/HYSTEROSCOPY WITH HYDROTHERMAL ABLATION (N/A) DILATATION AND CURETTAGE /HYSTEROSCOPY ()  Anesthesia type: Spinal  Patient location: PACU  Post pain: Pain level controlled  Post assessment: Post-op Vital signs reviewed  Last Vitals:  Filed Vitals:   12/27/11 1915  BP: 128/62  Pulse: 86  Temp:   Resp: 27    Post vital signs: Reviewed  Level of consciousness: awake  Complications: No apparent anesthesia complications

## 2011-12-27 NOTE — Anesthesia Procedure Notes (Signed)
Spinal  Patient location during procedure: OR Preanesthetic Checklist Completed: patient identified, site marked, surgical consent, pre-op evaluation, timeout performed, IV checked, risks and benefits discussed and monitors and equipment checked Spinal Block Patient position: sitting Prep: DuraPrep Patient monitoring: cardiac monitor, continuous pulse ox, blood pressure and heart rate Approach: right paramedian Location: L2-3 Injection technique: single-shot Needle Needle type: Tuohy and Sprotte  Needle gauge: 24 G Needle length: 15.2 cm Needle insertion depth: 11 cm Catheter type: closed end flexible Catheter size: 19 g Assessment Sensory level: T6 Additional Notes Attempted 90mm sprotte Change to 9cm touhy, no LOR, change to 15cm touhy,  LOR at 11cm. sprotte, 24 GA, to clear CSF, + asp  Spinal Dosage in OR  Bupivicaine ml       1.3, (+) asp CSF

## 2011-12-28 ENCOUNTER — Encounter (HOSPITAL_COMMUNITY): Payer: Self-pay | Admitting: Obstetrics and Gynecology

## 2011-12-28 NOTE — Telephone Encounter (Signed)
Per [facility] there is no ANCA and they have database that goes back x 6 months.

## 2012-01-02 NOTE — Telephone Encounter (Signed)
k

## 2012-02-16 ENCOUNTER — Telehealth: Payer: Self-pay | Admitting: Pulmonary Disease

## 2012-02-16 ENCOUNTER — Other Ambulatory Visit: Payer: Self-pay | Admitting: Obstetrics and Gynecology

## 2012-02-16 NOTE — Telephone Encounter (Signed)
Per Dustin Flock with Dr. Clifton James, pt is needing a hysterectomy under general anesthesia and they are asking for pulmonary clearance from Dr. Vassie Loll. They were wanting to get the pt scheduled for surgery ASAP. Dr. Vassie Loll pls advise. You last saw the pt in the office on 10/03/2011.

## 2012-02-16 NOTE — Telephone Encounter (Signed)
Pl send them same letter/OV  that was used for last surgery

## 2012-02-16 NOTE — Telephone Encounter (Signed)
Spoke to [name] at dr skinner's office and made her aware dr Vassie Loll said to send the note from the December ov --note faxed to [name] at fax# [phone]

## 2012-02-17 ENCOUNTER — Telehealth: Payer: Self-pay | Admitting: Pulmonary Disease

## 2012-02-17 NOTE — Telephone Encounter (Signed)
I spoke with Revonda Standard over at the cancer center in [place] and she would like to speak with RA personally if possible regarding pt's upcoming surgery for abdominal hysterectomy. . She has a couple questions for RA. She can be reached at the # listed above. Please advise RA thanks

## 2012-02-17 NOTE — Telephone Encounter (Signed)
Discussed with Revonda Standard. She will be mod -high risk for GA - decision should be based on need for procedure & discussion with pt. Monitoring oxygenation post op, stay on CPAP post op & careful with pain meds etc

## 2012-02-20 NOTE — Telephone Encounter (Signed)
Dr Vassie Loll, may triage close this message?  Thanks.

## 2012-02-20 NOTE — Telephone Encounter (Signed)
yes

## 2012-02-24 ENCOUNTER — Other Ambulatory Visit: Payer: Self-pay | Admitting: Obstetrics and Gynecology

## 2012-02-24 DIAGNOSIS — N938 Other specified abnormal uterine and vaginal bleeding: Secondary | ICD-10-CM

## 2012-02-28 ENCOUNTER — Ambulatory Visit
Admission: RE | Admit: 2012-02-28 | Discharge: 2012-02-28 | Disposition: A | Discharge status: Home / Self Care | Payer: Medicaid Other | Source: Ambulatory Visit | Attending: Obstetrics and Gynecology | Admitting: Obstetrics and Gynecology

## 2012-02-28 DIAGNOSIS — N938 Other specified abnormal uterine and vaginal bleeding: Secondary | ICD-10-CM

## 2012-09-09 ENCOUNTER — Inpatient Hospital Stay (HOSPITAL_COMMUNITY): Payer: Medicaid Other

## 2012-09-09 ENCOUNTER — Emergency Department (HOSPITAL_COMMUNITY): Payer: Medicaid Other

## 2012-09-09 ENCOUNTER — Observation Stay (HOSPITAL_COMMUNITY)
Admission: EM | Admit: 2012-09-09 | Discharge: 2012-09-10 | Disposition: A | Discharge status: Home / Self Care | Payer: Medicaid Other | Attending: Internal Medicine | Admitting: Internal Medicine

## 2012-09-09 ENCOUNTER — Encounter (HOSPITAL_COMMUNITY): Payer: Self-pay | Admitting: *Deleted

## 2012-09-09 DIAGNOSIS — R0602 Shortness of breath: Secondary | ICD-10-CM

## 2012-09-09 DIAGNOSIS — R918 Other nonspecific abnormal finding of lung field: Secondary | ICD-10-CM

## 2012-09-09 DIAGNOSIS — J209 Acute bronchitis, unspecified: Secondary | ICD-10-CM

## 2012-09-09 DIAGNOSIS — E662 Morbid (severe) obesity with alveolar hypoventilation: Secondary | ICD-10-CM

## 2012-09-09 DIAGNOSIS — M25469 Effusion, unspecified knee: Secondary | ICD-10-CM | POA: Insufficient documentation

## 2012-09-09 DIAGNOSIS — I10 Essential (primary) hypertension: Secondary | ICD-10-CM

## 2012-09-09 DIAGNOSIS — G4733 Obstructive sleep apnea (adult) (pediatric): Secondary | ICD-10-CM

## 2012-09-09 DIAGNOSIS — M25562 Pain in left knee: Secondary | ICD-10-CM

## 2012-09-09 DIAGNOSIS — K612 Anorectal abscess: Secondary | ICD-10-CM

## 2012-09-09 DIAGNOSIS — M79609 Pain in unspecified limb: Secondary | ICD-10-CM | POA: Insufficient documentation

## 2012-09-09 DIAGNOSIS — D869 Sarcoidosis, unspecified: Secondary | ICD-10-CM

## 2012-09-09 DIAGNOSIS — D500 Iron deficiency anemia secondary to blood loss (chronic): Secondary | ICD-10-CM

## 2012-09-09 DIAGNOSIS — R599 Enlarged lymph nodes, unspecified: Secondary | ICD-10-CM | POA: Insufficient documentation

## 2012-09-09 DIAGNOSIS — I517 Cardiomegaly: Secondary | ICD-10-CM | POA: Insufficient documentation

## 2012-09-09 DIAGNOSIS — M25569 Pain in unspecified knee: Secondary | ICD-10-CM

## 2012-09-09 DIAGNOSIS — R0600 Dyspnea, unspecified: Secondary | ICD-10-CM

## 2012-09-09 DIAGNOSIS — Z79899 Other long term (current) drug therapy: Secondary | ICD-10-CM | POA: Insufficient documentation

## 2012-09-09 DIAGNOSIS — R0789 Other chest pain: Secondary | ICD-10-CM

## 2012-09-09 DIAGNOSIS — R0609 Other forms of dyspnea: Secondary | ICD-10-CM

## 2012-09-09 LAB — LIPID PANEL
Cholesterol: 152 mg/dL (ref 0–200)
Total CHOL/HDL Ratio: 3.6 RATIO

## 2012-09-09 LAB — CBC WITH DIFFERENTIAL/PLATELET
Basophils Absolute: 0 10*3/uL (ref 0.0–0.1)
Basophils Relative: 0 % (ref 0–1)
Hemoglobin: 9.3 g/dL — ABNORMAL LOW (ref 12.0–15.0)
MCHC: 29.3 g/dL — ABNORMAL LOW (ref 30.0–36.0)
Neutro Abs: 5.9 10*3/uL (ref 1.7–7.7)
Neutrophils Relative %: 74 % (ref 43–77)
Platelets: 276 10*3/uL (ref 150–400)
RDW: 20.9 % — ABNORMAL HIGH (ref 11.5–15.5)

## 2012-09-09 LAB — APTT: aPTT: 29 seconds (ref 24–37)

## 2012-09-09 LAB — PRO B NATRIURETIC PEPTIDE: Pro B Natriuretic peptide (BNP): 142.1 pg/mL — ABNORMAL HIGH (ref 0–125)

## 2012-09-09 LAB — CBC
HCT: 31.7 % — ABNORMAL LOW (ref 36.0–46.0)
MCH: 17.7 pg — ABNORMAL LOW (ref 26.0–34.0)
MCHC: 29 g/dL — ABNORMAL LOW (ref 30.0–36.0)
MCV: 61 fL — ABNORMAL LOW (ref 78.0–100.0)
RDW: 20.8 % — ABNORMAL HIGH (ref 11.5–15.5)

## 2012-09-09 LAB — PROTIME-INR: Prothrombin Time: 14.3 seconds (ref 11.6–15.2)

## 2012-09-09 LAB — BASIC METABOLIC PANEL
Chloride: 98 mEq/L (ref 96–112)
GFR calc Af Amer: >90 mL/min (ref >90)
Potassium: 3.6 mEq/L (ref 3.5–5.1)
Sodium: 135 mEq/L (ref 135–145)

## 2012-09-09 LAB — CREATININE, SERUM: GFR calc Af Amer: >90 mL/min (ref >90)

## 2012-09-09 MED ORDER — ENOXAPARIN SODIUM 40 MG/0.4ML ~~LOC~~ SOLN: 40.0000 mg | SUBCUTANEOUS | Status: DC

## 2012-09-09 MED ORDER — ACETAMINOPHEN 650 MG RE SUPP: 650.0000 mg | Freq: Four times a day (QID) | RECTAL | Status: DC | PRN

## 2012-09-09 MED ORDER — METHYLPREDNISOLONE SODIUM SUCC 125 MG IJ SOLR
125.0000 mg | Freq: Once | INTRAMUSCULAR | Status: AC
  Administered 2012-09-09: 125 mg via INTRAVENOUS
  Filled 2012-09-09: qty 2

## 2012-09-09 MED ORDER — ONDANSETRON HCL 4 MG PO TABS: 4.0000 mg | ORAL_TABLET | Freq: Four times a day (QID) | ORAL | Status: DC | PRN

## 2012-09-09 MED ORDER — SODIUM CHLORIDE 0.9 % IJ SOLN: 3.0000 mL | Freq: Two times a day (BID) | INTRAMUSCULAR | Status: DC

## 2012-09-09 MED ORDER — FERROUS SULFATE 325 (65 FE) MG PO TABS
325.0000 mg | ORAL_TABLET | Freq: Three times a day (TID) | ORAL | Status: DC
  Administered 2012-09-09: 325 mg via ORAL
  Filled 2012-09-09 (×5): qty 1

## 2012-09-09 MED ORDER — NITROGLYCERIN 0.4 MG SL SUBL: 0.4000 mg | SUBLINGUAL_TABLET | SUBLINGUAL | Status: DC | PRN

## 2012-09-09 MED ORDER — SODIUM CHLORIDE 0.9 % IV SOLN: INTRAVENOUS | Status: DC

## 2012-09-09 MED ORDER — HYDROCODONE-ACETAMINOPHEN 5-325 MG PO TABS: 1.0000 | ORAL_TABLET | ORAL | Status: DC | PRN

## 2012-09-09 MED ORDER — TRIAMCINOLONE ACETONIDE 0.1 % EX OINT: 1.0000 "application " | TOPICAL_OINTMENT | Freq: Every day | CUTANEOUS | Status: DC | PRN

## 2012-09-09 MED ORDER — LEVOTHYROXINE SODIUM 100 MCG PO TABS
100.0000 ug | ORAL_TABLET | Freq: Every day | ORAL | Status: DC
  Administered 2012-09-10: 100 ug via ORAL
  Filled 2012-09-09 (×2): qty 1

## 2012-09-09 MED ORDER — ALBUTEROL SULFATE (5 MG/ML) 0.5% IN NEBU
2.5000 mg | INHALATION_SOLUTION | Freq: Four times a day (QID) | RESPIRATORY_TRACT | Status: DC
  Administered 2012-09-09 – 2012-09-10 (×2): 2.5 mg via RESPIRATORY_TRACT
  Filled 2012-09-09 (×3): qty 0.5

## 2012-09-09 MED ORDER — IPRATROPIUM BROMIDE 0.02 % IN SOLN
0.5000 mg | Freq: Four times a day (QID) | RESPIRATORY_TRACT | Status: DC
  Administered 2012-09-09 – 2012-09-10 (×2): 0.5 mg via RESPIRATORY_TRACT
  Filled 2012-09-09 (×3): qty 2.5

## 2012-09-09 MED ORDER — ONDANSETRON HCL 4 MG/2ML IJ SOLN: 4.0000 mg | Freq: Four times a day (QID) | INTRAMUSCULAR | Status: DC | PRN

## 2012-09-09 MED ORDER — OXYCODONE-ACETAMINOPHEN 5-325 MG PO TABS
2.0000 | ORAL_TABLET | Freq: Once | ORAL | Status: AC
  Administered 2012-09-09: 2 via ORAL
  Filled 2012-09-09: qty 2

## 2012-09-09 MED ORDER — SENNA 8.6 MG PO TABS
1.0000 | ORAL_TABLET | Freq: Two times a day (BID) | ORAL | Status: DC
  Administered 2012-09-09: 8.6 mg via ORAL
  Filled 2012-09-09: qty 1

## 2012-09-09 MED ORDER — LOSARTAN POTASSIUM 50 MG PO TABS
50.0000 mg | ORAL_TABLET | Freq: Every day | ORAL | Status: DC
  Administered 2012-09-09: 50 mg via ORAL
  Filled 2012-09-09 (×2): qty 1

## 2012-09-09 MED ORDER — LOSARTAN POTASSIUM-HCTZ 50-12.5 MG PO TABS: 1.0000 | ORAL_TABLET | Freq: Every morning | ORAL | Status: DC

## 2012-09-09 MED ORDER — HYDROCHLOROTHIAZIDE 12.5 MG PO CAPS
12.5000 mg | ORAL_CAPSULE | Freq: Every day | ORAL | Status: DC
  Administered 2012-09-09: 12.5 mg via ORAL
  Filled 2012-09-09 (×2): qty 1

## 2012-09-09 MED ORDER — DOCUSATE SODIUM 100 MG PO CAPS
100.0000 mg | ORAL_CAPSULE | Freq: Two times a day (BID) | ORAL | Status: DC
  Administered 2012-09-09: 100 mg via ORAL
  Filled 2012-09-09 (×3): qty 1

## 2012-09-09 MED ORDER — IOHEXOL 350 MG/ML SOLN
200.0000 mL | Freq: Once | INTRAVENOUS | Status: AC | PRN
  Administered 2012-09-09: 200 mL via INTRAVENOUS

## 2012-09-09 MED ORDER — ENOXAPARIN SODIUM 60 MG/0.6ML ~~LOC~~ SOLN
60.0000 mg | SUBCUTANEOUS | Status: DC
  Administered 2012-09-09: 60 mg via SUBCUTANEOUS
  Filled 2012-09-09 (×2): qty 0.6

## 2012-09-09 MED ORDER — ALBUTEROL SULFATE HFA 108 (90 BASE) MCG/ACT IN AERS
2.0000 | INHALATION_SPRAY | Freq: Four times a day (QID) | RESPIRATORY_TRACT | Status: DC | PRN
  Filled 2012-09-09: qty 6.7

## 2012-09-09 MED ORDER — ALBUTEROL SULFATE (5 MG/ML) 0.5% IN NEBU
5.0000 mg | INHALATION_SOLUTION | Freq: Once | RESPIRATORY_TRACT | Status: AC
  Administered 2012-09-09: 5 mg via RESPIRATORY_TRACT
  Filled 2012-09-09: qty 1

## 2012-09-09 MED ORDER — SODIUM CHLORIDE 0.9 % IV SOLN
INTRAVENOUS | Status: DC
  Administered 2012-09-09 (×2): via INTRAVENOUS

## 2012-09-09 MED ORDER — PREDNISONE 20 MG PO TABS
20.0000 mg | ORAL_TABLET | Freq: Every day | ORAL | Status: DC
  Filled 2012-09-09 (×2): qty 1

## 2012-09-09 MED ORDER — ASPIRIN EC 325 MG PO TBEC
325.0000 mg | DELAYED_RELEASE_TABLET | Freq: Every day | ORAL | Status: DC
  Administered 2012-09-09: 325 mg via ORAL
  Filled 2012-09-09 (×2): qty 1

## 2012-09-09 MED ORDER — ALBUTEROL SULFATE (5 MG/ML) 0.5% IN NEBU: 2.5000 mg | INHALATION_SOLUTION | RESPIRATORY_TRACT | Status: DC | PRN

## 2012-09-09 MED ORDER — ACETAMINOPHEN 325 MG PO TABS: 650.0000 mg | ORAL_TABLET | Freq: Four times a day (QID) | ORAL | Status: DC | PRN

## 2012-09-09 NOTE — Progress Notes (Addendum)
VASCULAR LAB PRELIMINARY  PRELIMINARY  PRELIMINARY  PRELIMINARY  Bilateral lower extremity venous Doppler completed.    Preliminary report:  There is no obvious evidence of DVT or SVT in the bilateral lower extremities.  [name], 09/09/2012, 12:31 PM

## 2012-09-09 NOTE — ED Notes (Signed)
RN to obtain labs with start of IV 

## 2012-09-09 NOTE — Progress Notes (Signed)
Set patient up on CPAP autotitrate 5cm H2O min, 20cm H2O max with nasal mask and 2L of oxygen bled in. Sterile water added to fill line. Patient states that she is comfortable.

## 2012-09-09 NOTE — H&P (Addendum)
[facility] History and Physical  [name] EAV:[id number] DOB: Jan 30, 1966 DOA: 09/09/2012  Referring physician: Lorre Nick PCP: Johny Blamer   Chief Complaint: Left knee pain and shortness of breath  HPI:  The patient is a 46 year old female with history of HTN, hypothyroidism, morbid obesity with chronic hypoventilation syndrome and OSA, COPD, and likely sarcoid who presents with shortness of breath and left knee pain.  The patient states that she was at her baseline level of health until 2 weeks ago when she developed an aching 8/10 left lateral knee pain that radiated around to her calf.  She did not have any associated swelling or redness of the knee.  The pain was worse at night and is relieved by pain medication.  She states she has stiffness in the morning of both knees that lasts for only a few minutes.  Last night, her knee pain was severe enough to keep her awake overnight.  After two percocet in the ER, her pain is now a 1-2/10.  Venous duplex in ER was negative for DVT or baker's cyst.    About 3 days ago, she developed a nonproductive cough with some shortness of breath.  She denies URI symptoms and fever.  At baseline, she is able to walk around her house and to her car without dyspnea, however, for the last few days she has been dyspneic at rest.  She has not needed to increase her home oxygen from baseline of 2L, however.  Her shortness of breath has been associated with 4/10 substernal chest pressure that is worse with inspiration and coughing, however, movement does not exacerbate her pain.  No radiation and no associated nausea, diaphoresis, lightheadedness.  In the ER, she had a CTa chest which was negative for pneumonia and PE, but which demonstrated hilar and mediastinal lymphadenopathy consistent with worsening sarcoidosis and cardiomegaly.  She was given a dose of solumedrol 125mg  IV.  She denies worsening lower extremity edema.  She has had orthopnea and  PND.    Review of Systems:  Denies fevers, chills, changes in hearing and vision.  Has occasional headaches.  She has seasonal allergies, but currently does not have sinus congestion or sore throat.  Endorses mild wheeze.  Denies nausea, vomiting, diarrhea, constipation.  Denies dysuria, urgency, polyuria, although her urine has been orange for the last few days.  Has chronic vaginal bleeding.  Has baseline arthritis in hands and shoulders and pain in left leg. Denies focal numbness or weakness.  Denies abnormal bruising or bleeding.  Denies anxiety or depression.    Past Medical History  Diagnosis Date  . Hypertension   . Hypothyroidism   . Anemia   . Morbid obesity   . Gallstones   . Seasonal allergies   . Chronic hyperventilation syndrome     w/ obesity tx with albuterol inhaler and oxygen 2L  . Shortness of breath   . Sleep apnea     uses CPAP machine   . GERD (gastroesophageal reflux disease)     diet controlled - no meds  . H/O hiatal hernia   . Arthritis     hands, shoulders, no meds  . Pneumonia     hoispitalized in 08/2011  . COPD (chronic obstructive pulmonary disease)     uses oxygen 2 L   Past Surgical History  Procedure Date  . Cholecystectomy 2000  . Cesarean section 1994    x 1  . I and d of abcess 05/2011  . Hysteroscopy w/d&c  12/27/2011    Procedure: DILATATION AND CURETTAGE /HYSTEROSCOPY;  Surgeon: Dorien Chihuahua. Richardson Dopp, MD;  Location: [facility];  Service: Gynecology;;  . Lung biopsy   . Uterine abletion    Social History:  reports that she has never smoked. She has never used smokeless tobacco. She reports that she drinks alcohol. She reports that she does not use illicit drugs. Lives with her son, in an apartment.  Completes ADLS without assistance, drives, and does not use cane or walker to ambulate.    No Known Allergies  Family History  Problem Relation Age of Onset  . Diabetes Father   . Diabetes Brother   . Deep vein thrombosis Mother   . Aneurysm Sister       d/o brain aneurysm    Prior to Admission medications   Medication Sig Start Date End Date Taking? Authorizing Provider  albuterol (VENTOLIN HFA) 108 (90 BASE) MCG/ACT inhaler Inhale 2 puffs into the lungs every 6 (six) hours as needed. For shortness of breath   Yes Historical Provider, MD  ferrous sulfate 325 (65 FE) MG tablet Take 325 mg by mouth 3 (three) times daily with meals.     Yes Historical Provider, MD  HYDROcodone-acetaminophen (VICODIN) 5-500 MG per tablet Take 1 tablet by mouth every 4 (four) hours as needed. 1-2 po q 6 hrs prn. Pain 12/27/11  Yes Dorien Chihuahua. Richardson Dopp, MD  levothyroxine (SYNTHROID, LEVOTHROID) 100 MCG tablet Take 100 mcg by mouth daily at 6 (six) AM.    Yes Historical Provider, MD  Loratadine (CLARITIN) 10 MG CAPS Take 1 capsule by mouth daily as needed. For allergy symptoms   Yes Historical Provider, MD  losartan-hydrochlorothiazide (HYZAAR) 50-12.5 MG per tablet Take 1 tablet by mouth every morning.    Yes Historical Provider, MD  triamcinolone (KENALOG) 0.1 % ointment Apply 1 application topically as needed. Applies prior to sun exposure   Yes Historical Provider, MD   Physical Exam: Filed Vitals:   09/09/12 1245 09/09/12 1300 09/09/12 1315 09/09/12 1348  BP:    154/69  Pulse: 87 86 81 86  Temp:      TempSrc:      Resp:      Weight:      SpO2: 95% 96% 98% 99%     General:  Obese AAF, no acute distress,   Eyes: PERRL, anicteric, noninjected  ENT: NCAT, nares clear, oropharynx nonerythematous, no exudate or plaques  Neck: Mild thyromegaly with possible nodule on the left side   Cardiovascular: RRR, no murmurs, rubs, or gallops, 2+ pulses, warm extremities  Respiratory: CTAB  Abdomen: NABS, soft, obese, nontender  Skin: Dry skin on face, arms and legs with excoriated patches on the arms  Musculoskeletal: Normal tone and bulk, 1+ lower extremity edema.  Left knee with pain with ROM, tenderness to palpation along the left lateral aspect near the joint  line and ligaments and tendons and extending down to the left lateral tibial area.  No posterior knee swelling.  Mild left lateral calf tenderness  Psychiatric: A&Ox4  Neurologic: III-XII grossly intact, patient unable to lift legs from bed likely due to obesity, but otherwise 5/5. Sensation intact to light touch.    Labs on Admission:  Basic Metabolic Panel:  Lab 09/09/12 1610  NA 135  K 3.6  CL 98  CO2 28  GLUCOSE 101*  BUN 8  CREATININE 0.53  CALCIUM 8.9  MG --  PHOS --   Liver Function Tests: No results found for this basename:  AST:5,ALT:5,ALKPHOS:5,BILITOT:5,PROT:5,ALBUMIN:5 in the last 168 hours No results found for this basename: LIPASE:5,AMYLASE:5 in the last 168 hours No results found for this basename: AMMONIA:5 in the last 168 hours CBC:  Lab 09/09/12 1041  WBC 8.0  NEUTROABS 5.9  HGB 9.3*  HCT 31.7*  MCV 61.1*  PLT 276   Cardiac Enzymes: No results found for this basename: CKTOTAL:5,CKMB:5,CKMBINDEX:5,TROPONINI:5 in the last 168 hours  BNP (last 3 results)  Basename 09/09/12 1106  PROBNP 142.1*   CBG: No results found for this basename: GLUCAP:5 in the last 168 hours  Radiological Exams on Admission: Ct Angio Chest Pe W/cm &/or Wo Cm  09/09/2012  *RADIOLOGY REPORT*  Clinical Data: Shortness of breath.  Leg swelling.  History of sarcoidosis.  Morbid obesity.  CT ANGIOGRAPHY CHEST  Technique:  Multidetector CT imaging of the chest using the standard protocol during bolus administration of intravenous contrast. Multiplanar reconstructed images including MIPs were obtained and reviewed to evaluate the vascular anatomy.  Contrast: OMNIPAQUE IOHEXOL 350 MG/ML SOLN CT of the chest on 08/23/2011  Comparison: CT of the chest 08/23/2011  Findings: The heart is enlarged.  There is poor opacification of the pulmonary arteries.  Although no large pulmonary emboli are identified, small emboli may not be detectable given the contrast bolus.  There is significant  hilar and mediastinal adenopathy. There are diffuse parenchymal opacities bilaterally, involving both upper and lower lobes.  Parenchymal opacities are similar to the previous exam.  Adenopathy has progressed since the prior study. Findings are consistent with the patient's known sarcoidosis.  The thyroid gland has a normal appearance.  The heart is enlarged. Images of the upper abdomen are unremarkable.  There are significant degenerative changes in the thoracic spine.  IMPRESSION:  1.  Study is technically inadequate to exclude small pulmonary emboli.  However, no large central pulmonary emboli are identified. 2.  Mediastinal and hilar adenopathy and parenchymal lung changes are most consistent with sarcoidosis.  Adenopathy has progressed since prior study. 3.  Cardiomegaly.   Original Report Authenticated By: Norva Pavlov, M.D.    Dg Chest Port 1 View  09/09/2012  *RADIOLOGY REPORT*  Clinical Data: Shortness of breath, leg pain  PORTABLE CHEST - 1 VIEW  Comparison: 09/26/2011  Findings: The cardiomediastinal silhouette is stable.  There is bilateral airspace disease perihilar region and lower lobes right greater than left.  Evolving pneumonia or asymmetric edema cannot be excluded.  Clinical correlation is necessary.  Follow-up examination is recommended.  IMPRESSION:  There is bilateral airspace disease perihilar region and lower lobes right greater than left.  Evolving pneumonia or asymmetric edema cannot be excluded.  Clinical correlation is necessary. Follow-up examination is recommended.   Original Report Authenticated By: Natasha Mead, M.D.    Dg Knee 4 Views W/patella Left  09/09/2012  *RADIOLOGY REPORT*  Clinical Data: Left knee pain.  LEFT KNEE - COMPLETE 4+ VIEW  Comparison: None.  Findings: Severe tri-compartmental spurring is present. This is most striking in the medial compartment where irregular spurring obscures the joint space.  Spurring is present along the intercondylar notch and tibial  spine.  On the lateral projection, a trace knee effusion is present.  IMPRESSION:  1.  Prominent tri-compartmental spurring, most especially in the medial compartment where there is some fragmentation of the spurring. 2.  Trace knee effusion. 3.  No definite fracture, although the degree of spur related cortical irregularity reduces sensitivity for subtle fractures.   Original Report Authenticated By: Gaylyn Rong, M.D.  EKG: Independently reviewed. Sinus tachycardia.  Assessment/Plan Principal Problem:  *Chest pressure Active Problems:  OSA (obstructive sleep apnea)  Obesity hypoventilation syndrome  Hypertension  Left knee pain  Sarcoidosis  Shortness of breath   Chest pressure, atypical, but in patient with morbid obesity, HTN, and risk for diabetes.   -  Telemetry -  Cycle troponins -  A1c -  Lipid panel -  ASA 325mg   -  Cycle ECG  Shortness of breath, ddx includes COPD exacerbation, bronchitis, worsening sarcoid or pulmonary renal syndrome or other autoimmune syndrome.  Patient has joint pains (left knee) and dark urine which may suggest renal component.  May also include heart failure, particularly in setting of pulmonary disease, as patient has lower extremity edema and cardiomegaly on CT.  BNP can be falsely low in setting of morbid obesity.  Most likely patient has worsening sarcoidosis based on CT findings.  Discussed case with patient's pulmonologist Dr. Vassie Loll who recommended prednisone 20mg  daily and follow up in his office in a few weeks.   -  Duonebs with albuterol PRN -  Prednisone 20mg  daily -  ECHO -  If ECHO abnormal consider gentle diuresis -  ANCA, ACEI, ANA -  Urinalysis  COPD/OHS/OSA -  Continue oxygen to keep O2 saturations >92% -  CPAP at night  Left knee pain, ddx includes arthritis, tendon or ligament strain.  Patient denies falls or trauma.   -  F/u official duplex read for Baker's cyst -  XR knee:  Consistent with arthritis - Spoke with Dr.  Magnus Ivan who stated he may consider a steroid injection of the knee for pain relief.  May also get better with oral steroids.   -  Consider NSAIDS and vicodin  HTN, blood pressure was elevated likely due to pain at presentation to ER, but trending down without intervention -  Continue home blood pressure medications  Microcytic anemia of chronic blood loss.  Patient has not been compliant with oral iron.  -  Restart oral iron, but consider referral for IV iron as outpatient.    Hypothyroidism with possible thyroid nodule, asymptomatic -  Will need ultrasound of thyroid as outpatient -  Continue synthroid.    DIET:  Healthy heart ACCESS:  PIV IVF:  NS at 123ml/h for renal protection after CTa PROPH:  lovenox  Code Status: Full code Family Communication: Spoke with patient who was alone  Disposition Plan: Pending troponins, blood work complete, PT evaluation, likely to home tomorrow.   Time spent: 37  Renae Fickle [facility] Pager [phone]  If 7PM-7AM, please contact night-coverage [email or web address] Password [email or web address] 09/09/2012, 3:09 PM

## 2012-09-09 NOTE — Consult Note (Signed)
Reason for Consult:  Left knee pain Referring Physician:   Malachi Bonds, MD  [name] is an 46 y.o. female.  HPI: 46 yo morbidly obese female who reports a 2 weeks history of worsening left knee pain, especially with activities.  No known injury, but her weight is certainly a factor.  Xrays of the knee show loss of joint space and multiple bone spurs consistent with moderate to severe OA.  Past Medical History  Diagnosis Date  . Hypertension   . Hypothyroidism   . Anemia   . Morbid obesity   . Gallstones   . Seasonal allergies   . Chronic hyperventilation syndrome     w/ obesity tx with albuterol inhaler and oxygen 2L  . Shortness of breath   . Sleep apnea     uses CPAP machine   . GERD (gastroesophageal reflux disease)     diet controlled - no meds  . H/O hiatal hernia   . Arthritis     hands, shoulders, no meds  . Pneumonia     hoispitalized in 08/2011  . COPD (chronic obstructive pulmonary disease)     uses oxygen 2 L    Past Surgical History  Procedure Date  . Cholecystectomy 2000  . Cesarean section 1994    x 1  . I and d of abcess 05/2011  . Hysteroscopy w/d&c 12/27/2011    Procedure: DILATATION AND CURETTAGE /HYSTEROSCOPY;  Surgeon: Dorien Chihuahua. Richardson Dopp, MD;  Location: [facility];  Service: Gynecology;;  . Lung biopsy   . Uterine abletion     Family History  Problem Relation Age of Onset  . Diabetes Father   . Diabetes Brother   . Deep vein thrombosis Mother   . Aneurysm Sister     d/o brain aneurysm    Social History:  reports that she has never smoked. She has never used smokeless tobacco. She reports that she drinks alcohol. She reports that she does not use illicit drugs.  Allergies: No Known Allergies  Medications: I have reviewed the patient's current medications.  Results for orders placed during the hospital encounter of 09/09/12 (from the past 48 hour(s))  CBC WITH DIFFERENTIAL     Status: Abnormal   Collection Time   09/09/12 10:41 AM   Component Value Range Comment   WBC 8.0  4.0 - 10.5 K/uL    RBC 5.19 (*) 3.87 - 5.11 MIL/uL    Hemoglobin 9.3 (*) 12.0 - 15.0 g/dL    HCT 16.1 (*) 09.6 - 46.0 %    MCV 61.1 (*) 78.0 - 100.0 fL    MCH 17.9 (*) 26.0 - 34.0 pg    MCHC 29.3 (*) 30.0 - 36.0 g/dL    RDW 04.5 (*) 40.9 - 15.5 %    Platelets 276  150 - 400 K/uL    Neutrophils Relative 74  43 - 77 %    Neutro Abs 5.9  1.7 - 7.7 K/uL    Lymphocytes Relative 14  12 - 46 %    Lymphs Abs 1.1  0.7 - 4.0 K/uL    Monocytes Relative 9  3 - 12 %    Monocytes Absolute 0.7  0.1 - 1.0 K/uL    Eosinophils Relative 3  0 - 5 %    Eosinophils Absolute 0.3  0.0 - 0.7 K/uL    Basophils Relative 0  0 - 1 %    Basophils Absolute 0.0  0.0 - 0.1 K/uL   BASIC METABOLIC PANEL  Status: Abnormal   Collection Time   09/09/12 10:41 AM      Component Value Range Comment   Sodium 135  135 - 145 mEq/L    Potassium 3.6  3.5 - 5.1 mEq/L    Chloride 98  96 - 112 mEq/L    CO2 28  19 - 32 mEq/L    Glucose, Bld 101 (*) 70 - 99 mg/dL    BUN 8  6 - 23 mg/dL    Creatinine, Ser 1.61  0.50 - 1.10 mg/dL    Calcium 8.9  8.4 - 09.6 mg/dL    GFR calc non Af Amer >90  >90 mL/min    GFR calc Af Amer >90  >90 mL/min   PROTIME-INR     Status: Normal   Collection Time   09/09/12 10:41 AM      Component Value Range Comment   Prothrombin Time 14.3  11.6 - 15.2 seconds    INR 1.13  0.00 - 1.49   APTT     Status: Normal   Collection Time   09/09/12 10:41 AM      Component Value Range Comment   aPTT 29  24 - 37 seconds   PRO B NATRIURETIC PEPTIDE     Status: Abnormal   Collection Time   09/09/12 11:06 AM      Component Value Range Comment   Pro B Natriuretic peptide (BNP) 142.1 (*) 0 - 125 pg/mL   POCT I-STAT TROPONIN I     Status: Normal   Collection Time   09/09/12 11:30 AM      Component Value Range Comment   Troponin i, poc 0.00  0.00 - 0.08 ng/mL    Comment 3            CBC     Status: Abnormal   Collection Time   09/09/12  5:16 PM      Component  Value Range Comment   WBC 10.3  4.0 - 10.5 K/uL    RBC 5.20 (*) 3.87 - 5.11 MIL/uL    Hemoglobin 9.2 (*) 12.0 - 15.0 g/dL    HCT 04.5 (*) 40.9 - 46.0 %    MCV 61.0 (*) 78.0 - 100.0 fL    MCH 17.7 (*) 26.0 - 34.0 pg    MCHC 29.0 (*) 30.0 - 36.0 g/dL    RDW 81.1 (*) 91.4 - 15.5 %    Platelets 271  150 - 400 K/uL   TROPONIN I     Status: Normal   Collection Time   09/09/12  5:20 PM      Component Value Range Comment   Troponin I <0.30  <0.30 ng/mL     Ct Angio Chest Pe W/cm &/or Wo Cm  09/09/2012  *RADIOLOGY REPORT*  Clinical Data: Shortness of breath.  Leg swelling.  History of sarcoidosis.  Morbid obesity.  CT ANGIOGRAPHY CHEST  Technique:  Multidetector CT imaging of the chest using the standard protocol during bolus administration of intravenous contrast. Multiplanar reconstructed images including MIPs were obtained and reviewed to evaluate the vascular anatomy.  Contrast: OMNIPAQUE IOHEXOL 350 MG/ML SOLN CT of the chest on 08/23/2011  Comparison: CT of the chest 08/23/2011  Findings: The heart is enlarged.  There is poor opacification of the pulmonary arteries.  Although no large pulmonary emboli are identified, small emboli may not be detectable given the contrast bolus.  There is significant hilar and mediastinal adenopathy. There are diffuse parenchymal opacities bilaterally, involving both upper  and lower lobes.  Parenchymal opacities are similar to the previous exam.  Adenopathy has progressed since the prior study. Findings are consistent with the patient's known sarcoidosis.  The thyroid gland has a normal appearance.  The heart is enlarged. Images of the upper abdomen are unremarkable.  There are significant degenerative changes in the thoracic spine.  IMPRESSION:  1.  Study is technically inadequate to exclude small pulmonary emboli.  However, no large central pulmonary emboli are identified. 2.  Mediastinal and hilar adenopathy and parenchymal lung changes are most consistent  with sarcoidosis.  Adenopathy has progressed since prior study. 3.  Cardiomegaly.   Original Report Authenticated By: Norva Pavlov, M.D.    Dg Chest Port 1 View  09/09/2012  *RADIOLOGY REPORT*  Clinical Data: Shortness of breath, leg pain  PORTABLE CHEST - 1 VIEW  Comparison: 09/26/2011  Findings: The cardiomediastinal silhouette is stable.  There is bilateral airspace disease perihilar region and lower lobes right greater than left.  Evolving pneumonia or asymmetric edema cannot be excluded.  Clinical correlation is necessary.  Follow-up examination is recommended.  IMPRESSION:  There is bilateral airspace disease perihilar region and lower lobes right greater than left.  Evolving pneumonia or asymmetric edema cannot be excluded.  Clinical correlation is necessary. Follow-up examination is recommended.   Original Report Authenticated By: Natasha Mead, M.D.    Dg Knee 4 Views W/patella Left  09/09/2012  *RADIOLOGY REPORT*  Clinical Data: Left knee pain.  LEFT KNEE - COMPLETE 4+ VIEW  Comparison: None.  Findings: Severe tri-compartmental spurring is present. This is most striking in the medial compartment where irregular spurring obscures the joint space.  Spurring is present along the intercondylar notch and tibial spine.  On the lateral projection, a trace knee effusion is present.  IMPRESSION:  1.  Prominent tri-compartmental spurring, most especially in the medial compartment where there is some fragmentation of the spurring. 2.  Trace knee effusion. 3.  No definite fracture, although the degree of spur related cortical irregularity reduces sensitivity for subtle fractures.   Original Report Authenticated By: Gaylyn Rong, M.D.     ROS Blood pressure 155/84, pulse 88, temperature 98 F (36.7 C), temperature source Oral, resp. rate 24, height 5\' 6"  (1.676 m), weight 221.9 kg (489 lb 3.2 oz), last menstrual period 08/15/2012, SpO2 97.00%. Physical Exam  Musculoskeletal:       Left knee:  tenderness found. Medial joint line and lateral joint line tenderness noted.  Good passive and active range of motion.  No significant effusion  Assessment/Plan: Moderate to severe arthritis in a morbidly obese individual 1) I did talk to her in length about her knee and then provided an injection of 4 cc plain 1% lidocaine mixed with 1 cc of steroid (depomedrol).  She tolerated this well.  She can increase her activities with that left knee as comfort allows.  She can always be seen in my office for another injection in about 3 months if needed.  Irma Delancey Y 09/09/2012, 6:14 PM

## 2012-09-09 NOTE — ED Notes (Signed)
Called report to [facility] [address]. When a bariatric bed is placed into pt room, RN will call back and notify.

## 2012-09-09 NOTE — ED Notes (Addendum)
Per Pt: she had left lower extremity pain for last two weeks, more when lying down, today pt states she started having shortness of breath, she called her primary doctor and was told to come to emergency room to rule out DVT. Pt's O2 sats on arrival 76% pt placed on 4L of O2 via n/c, sats 98%. Dr Freida Busman at bedside. Pt placed on cardiac monitor.

## 2012-09-09 NOTE — ED Notes (Signed)
Pt O2 reduced to 2L. Pt now O2 sats, 99%. Pt is on 2L at home at all times

## 2012-09-09 NOTE — ED Provider Notes (Signed)
History     CSN: [id number]  Arrival date & time 09/09/12  1021   First MD Initiated Contact with Patient 09/09/12 1023      Chief Complaint  Patient presents with  . Shortness of Breath    (Consider location/radiation/quality/duration/timing/severity/associated sxs/prior treatment) The history is provided by the patient.   patient here complaining of shortness of breath which became worse today. She has noted left leg pain for 3 days characterized as sharp and behind her calf extending up to her thigh. Does have a history of pulmonary pathology which has possibly been diagnosed as sarcoidosis. She is also morbidly obese. Has noticed a recent nonproductive cough without associated fever or pleuritic chest pain. Denies any vomiting or diarrhea. Used her albuterol inhaler without relief. She states that she has had multiple CTs for the past dyspnea has never had leg pain associated with it  Past Medical History  Diagnosis Date  . Hypertension   . Hypothyroidism   . Anemia   . Morbid obesity   . Gallstones   . Seasonal allergies   . Chronic hyperventilation syndrome     w/ obesity tx with albuterol inhaler and oxygen 2L  . Shortness of breath   . Sleep apnea     uses CPAP machine   . GERD (gastroesophageal reflux disease)     diet controlled - no meds  . H/O hiatal hernia   . Arthritis     hands, shoulders, no meds  . Pneumonia     hoispitalized in 08/2011  . COPD (chronic obstructive pulmonary disease)     uses oxygen 2 L    Past Surgical History  Procedure Date  . Cholecystectomy 2000  . Cesarean section 1994    x 1  . I and d of abcess 05/2011  . Hysteroscopy w/d&c 12/27/2011    Procedure: DILATATION AND CURETTAGE /HYSTEROSCOPY;  Surgeon: Dorien Chihuahua. Richardson Dopp, MD;  Location: [facility];  Service: Gynecology;;    Family History  Problem Relation Age of Onset  . Diabetes Father   . Diabetes Brother     History  Substance Use Topics  . Smoking status: Never Smoker   .  Smokeless tobacco: Never Used  . Alcohol Use: Yes     Comment: occasionally    OB History    Grav Para Term Preterm Abortions TAB SAB Ect Mult Living                  Review of Systems  All other systems reviewed and are negative.    Allergies  Review of patient's allergies indicates no known allergies.  Home Medications   Current Outpatient Rx  Name  Route  Sig  Dispense  Refill  . ALBUTEROL SULFATE HFA 108 (90 BASE) MCG/ACT IN AERS   Inhalation   Inhale 2 puffs into the lungs every 6 (six) hours as needed. For shortness of breath         . DOXYCYCLINE HYCLATE 100 MG PO TABS      1 po bid day one then 1 po qd   8 tablet   0   . FERROUS SULFATE 325 (65 FE) MG PO TABS   Oral   Take 325 mg by mouth 3 (three) times daily with meals.           Marland Kitchen HYDROCODONE-ACETAMINOPHEN 5-500 MG PO TABS      1-2 po q 6 hrs prn   30 tablet   0   . LEVOTHYROXINE SODIUM  100 MCG PO TABS   Oral   Take 100 mcg by mouth daily at 6 (six) AM.          . LORATADINE 10 MG PO CAPS   Oral   Take 1 capsule by mouth daily as needed. For allergy symptoms         . LOSARTAN POTASSIUM-HCTZ 50-12.5 MG PO TABS   Oral   Take 1 tablet by mouth every morning.          . TRANEXAMIC ACID 650 MG PO TABS   Oral   Take 1,300 mg by mouth 3 (three) times daily. Takes for 5 days during menstruation         . TRIAMCINOLONE ACETONIDE 0.1 % EX OINT   Topical   Apply 1 application topically as needed. Applies prior to sun exposure           There were no vitals taken for this visit.  Physical Exam  Nursing note and vitals reviewed. Constitutional: She is oriented to person, place, and time. She appears well-developed and well-nourished.  Non-toxic appearance. No distress.  HENT:  Head: Normocephalic and atraumatic.  Eyes: Conjunctivae normal, EOM and lids are normal. Pupils are equal, round, and reactive to light.  Neck: Normal range of motion. Neck supple. No tracheal deviation  present. No mass present.  Cardiovascular: Regular rhythm and normal heart sounds.  Tachycardia present.  Exam reveals no gallop.   No murmur heard. Pulmonary/Chest: Effort normal. No stridor. No respiratory distress. She has decreased breath sounds. She has no wheezes. She has no rhonchi. She has no rales.  Abdominal: Soft. Normal appearance and bowel sounds are normal. She exhibits no distension. There is no tenderness. There is no rebound and no CVA tenderness.  Musculoskeletal: Normal range of motion. She exhibits no edema and no tenderness.  Neurological: She is alert and oriented to person, place, and time. She has normal strength. No cranial nerve deficit or sensory deficit. GCS eye subscore is 4. GCS verbal subscore is 5. GCS motor subscore is 6.  Skin: Skin is warm and dry. No abrasion and no rash noted.  Psychiatric: She has a normal mood and affect. Her speech is normal and behavior is normal.    ED Course  Procedures (including critical care time)  Labs Reviewed - No data to display No results found.   No diagnosis found.    MDM  Patient had a CT of her chest that was negative for PE. She also had lower extremity Dopplers are negative as well 2. Her initial chest x-ray was questionable for pneumonia but due to a negative CT that appears to be less likely. Patient with likely exacerbation of her sarcoid she was given steroids and she will be admitted        Toy Baker, MD 09/09/12 1345

## 2012-09-10 ENCOUNTER — Other Ambulatory Visit: Payer: Self-pay

## 2012-09-10 LAB — ANA: Anti Nuclear Antibody(ANA): POSITIVE — AB

## 2012-09-10 LAB — URINALYSIS, ROUTINE W REFLEX MICROSCOPIC
Glucose, UA: NEGATIVE mg/dL
Leukocytes, UA: NEGATIVE
Protein, ur: 100 mg/dL — AB

## 2012-09-10 LAB — URINE MICROSCOPIC-ADD ON

## 2012-09-10 LAB — TROPONIN I: Troponin I: <0.3 ng/mL (ref <0.30)

## 2012-09-10 MED ORDER — PREDNISONE 20 MG PO TABS: 20.0000 mg | ORAL_TABLET | Freq: Every day | ORAL | Status: DC

## 2012-09-10 MED ORDER — DSS 100 MG PO CAPS: 100.0000 mg | ORAL_CAPSULE | Freq: Two times a day (BID) | ORAL | Status: DC

## 2012-09-10 MED ORDER — HYDROCODONE-ACETAMINOPHEN 5-325 MG PO TABS: 1.0000 | ORAL_TABLET | Freq: Four times a day (QID) | ORAL | Status: DC | PRN

## 2012-09-10 NOTE — Discharge Summary (Signed)
Physician Discharge Summary  [name] ZOX:[id number] DOB: [birth year] DOA: 09/09/2012  PCP: Johny Blamer  Admit date: 09/09/2012 Discharge date: 09/10/2012  Recommendations for Outpatient Follow-up:  1. Follow up with primary care doctor 1 weeks regarding possibility of IV iron infusion, persistent blood in urine and possible thyroid US if not already complete for possible thyroid nodule on left side.   2. May follow up with Dr. Magnus Ivan in 3 months as needed if needed  Discharge Diagnoses:  Principal Problem:  *Chest pressure Active Problems:  OSA (obstructive sleep apnea)  Obesity hypoventilation syndrome  Hypertension  Left knee pain  Sarcoidosis  Shortness of breath  Iron deficiency anemia due to chronic blood loss   Discharge Condition: stable, improved  Diet recommendation: healthy heart  Wt Readings from Last 3 Encounters:  09/09/12 221.9 kg (489 lb 3.2 oz)  12/20/11 220.448 kg (486 lb)  10/03/11 221.446 kg (488 lb 3.2 oz)    History of present illness:   The patient is a 46 year old female with history of HTN, hypothyroidism, morbid obesity with chronic hypoventilation syndrome and OSA, COPD, and likely sarcoid who presents with shortness of breath and left knee pain. The patient states that she was at her baseline level of health until 2 weeks ago when she developed an aching 8/10 left lateral knee pain that radiated around to her calf. She did not have any associated swelling or redness of the knee. The pain was worse at night and is relieved by pain medication. She states she has stiffness in the morning of both knees that lasts for only a few minutes. Last night, her knee pain was severe enough to keep her awake overnight. After two percocet in the ER, her pain is now a 1-2/10. Venous duplex in ER was negative for DVT or baker's cyst.   About 3 days ago, she developed a nonproductive cough with some shortness of breath. She denies URI symptoms and fever.  At baseline, she is able to walk around her house and to her car without dyspnea, however, for the last few days she has been dyspneic at rest. She has not needed to increase her home oxygen from baseline of 2L, however. Her shortness of breath has been associated with 4/10 substernal chest pressure that is worse with inspiration and coughing, however, movement does not exacerbate her pain. No radiation and no associated nausea, diaphoresis, lightheadedness. In the ER, she had a CTa chest which was negative for pneumonia and PE, but which demonstrated hilar and mediastinal lymphadenopathy consistent with worsening sarcoidosis and cardiomegaly. She was given a dose of solumedrol 125mg  IV. She denies worsening lower extremity edema. She has had orthopnea and PND.   Hospital Course:   Chest pressure, atypical, but in patient with morbid obesity, HTN, and risk for diabetes.  Telemetry demonstrated no arrhythmias, troponins were negative x 3.  Her lipid panel was at goal.  Her hemoglobin a1c was mildly elevated at 5.7 and she was given counseling about weight loss and exercise.    Shortness of breath, ddx includes COPD exacerbation, bronchitis, worsening sarcoid or pulmonary renal syndrome or other autoimmune syndrome. Patient has joint pains (left knee) and dark urine which may suggest renal component. May also include heart failure, particularly in setting of pulmonary disease, as patient has lower extremity edema and cardiomegaly on CT, however, patient had improvement of dyspnea despite IV fluids.  Most likely patient has worsening sarcoidosis based on CT findings. Discussed case with patient's pulmonologist Dr. Vassie Loll who  recommended prednisone 20mg  daily and follow up in his office in a few weeks.  She was given duonebs and maintained her oxygen saturations on her home 2L nasal canula.  Her urinalysis demonstrated hematuria and her ANCA, ACEI, and ANA tests are pending at time of discharge.  She should follow  up with her PCP regarding persistent hematuria and pulmonary regarding rests of blood tests for pulmonary-renal syndrome.    COPD/OHS/OSA.  She should continue her home oxygen and CPAP.    Left knee pain, ddx includes arthritis, tendon or ligament strain. Patient denies falls or trauma.  Duplex was negative for DVT, but X-ray demonstrated severe arthritis.  Dr. Magnus Ivan injected steroid into her left knee for pain relief.  She may use NSAIDS and narcotics for pain relief.     HTN, blood pressure was elevated likely due to pain at presentation to ER, but trended down without intervention.  She should continue her home blood pressure medications.    Microcytic anemia of chronic blood loss. Patient has not been compliant with oral iron. Restarted oral iron, but consider referral for IV iron as outpatient.   Hypothyroidism with possible thyroid nodule, asymptomatic.  Will need ultrasound of thyroid as outpatient.  Continue synthroid.     Procedures:  X-ray knee  ECHO  Consultations:  Orthopedic surgery  Discharge Exam: Filed Vitals:   09/10/12 0516  BP: 140/70  Pulse: 66  Temp: 97.4 F (36.3 C)  Resp: 20   Filed Vitals:   09/09/12 1946 09/09/12 2108 09/10/12 0220 09/10/12 0516  BP:  173/82  140/70  Pulse:  88  66  Temp:  98.2 F (36.8 C)  97.4 F (36.3 C)  TempSrc:  Oral  Oral  Resp:  20  20  Height:      Weight:      SpO2: 94% 97% 94% 99%    General: Obese AAF, no acute distress,  ENT: NCAT, MMM  Neck: Mild thyromegaly with possible nodule on the left side  Cardiovascular: RRR, no murmurs, rubs, or gallops, 2+ pulses, warm extremities  Respiratory: CTAB  Abdomen: NABS, soft, obese, nontender  Musculoskeletal: Normal tone and bulk, trace lower extremity edema. Left knee with no tenderness to palpation along the left lateral aspect near the joint line and ligaments and tendons today. No posterior knee swelling. No calf tenderness   Discharge Instructions        Discharge Orders    Future Orders Please Complete By Expires   Diet - low sodium heart healthy      Increase activity slowly      Discharge instructions      Comments:   You were hospitalized with shortness of breath which was likely due to sarcoid disease.  Dr. Vassie Loll recommended starting daily prednisone 20mg  and follow up with him in a few weeks.  He will review the results of some blood tests which are pending at the time of discharge.  Please talk to your primary care doctor also regarding the presence of blood in your urine.  Please take a daily omeprazole if you develop stomach pains or indigestion while on steroids.  Take a daily calcium with vitamin D to prevent osteoporosis while on steroids.  You had some arthritis in your left knee which was causing pain.  Dr. Magnus Ivan injected steroids into that knee.  You may follow up with him as needed if you have worsening joint pain or pain in your other knee.  You may take naprosyn as needed  for joint pain or vicodin for breakthrough pain.  If you use vicodin, please take a stool softener to prevent constipation.  Please read side effects of your medications prior to taking.  Please read through all of your discharge paperwork and call your doctor if you have questions.   Driving Restrictions      Comments:   Do not drive or operative heavy machinery while taking narcotic medications.   Call MD for:  temperature >100.4      Call MD for:  persistant nausea and vomiting      Call MD for:  severe uncontrolled pain      Call MD for:  hives      Call MD for:  difficulty breathing, headache or visual disturbances      Call MD for:  redness, tenderness, or signs of infection (pain, swelling, redness, odor or green/yellow discharge around incision site)      Call MD for:  persistant dizziness or light-headedness      Call MD for:  extreme fatigue          Medication List     As of 09/10/2012  8:17 AM    TAKE these medications         CLARITIN  10 MG Caps   Generic drug: Loratadine   Take 1 capsule by mouth daily as needed. For allergy symptoms      DSS 100 MG Caps   Take 100 mg by mouth 2 (two) times daily.      ferrous sulfate 325 (65 FE) MG tablet   Take 325 mg by mouth 3 (three) times daily with meals.      HYDROcodone-acetaminophen 5-325 MG per tablet   Commonly known as: NORCO/VICODIN   Take 1-2 tablets by mouth every 6 (six) hours as needed for pain.      levothyroxine 100 MCG tablet   Commonly known as: SYNTHROID, LEVOTHROID   Take 100 mcg by mouth daily at 6 (six) AM.      losartan-hydrochlorothiazide 50-12.5 MG per tablet   Commonly known as: HYZAAR   Take 1 tablet by mouth every morning.      predniSONE 20 MG tablet   Commonly known as: DELTASONE   Take 1 tablet (20 mg total) by mouth daily with breakfast.      triamcinolone ointment 0.1 %   Commonly known as: KENALOG   Apply 1 application topically as needed. Applies prior to sun exposure      VENTOLIN HFA 108 (90 BASE) MCG/ACT inhaler   Generic drug: albuterol   Inhale 2 puffs into the lungs every 6 (six) hours as needed. For shortness of breath        Follow-up Information    Follow up with Johny Blamer. Call in 1 week. (regarding blood in urine)    Contact information:   [phone]       Follow up with Oretha Milch., MD. Schedule an appointment as soon as possible for a visit in 2 weeks.   Contact information:   [address] [place] [address] [phone]       Follow up with Kathryne Hitch, MD. In 3 months. (As needed)    Contact information:   [address] [place] [address] [phone]           The results of significant diagnostics from this hospitalization (including imaging, microbiology, ancillary and laboratory) are listed below for reference.    Significant Diagnostic Studies: Ct Angio Chest Pe  W/cm &/or Wo Cm  09/09/2012  *RADIOLOGY REPORT*  Clinical Data: Shortness of breath.  Leg  swelling.  History of sarcoidosis.  Morbid obesity.  CT ANGIOGRAPHY CHEST  Technique:  Multidetector CT imaging of the chest using the standard protocol during bolus administration of intravenous contrast. Multiplanar reconstructed images including MIPs were obtained and reviewed to evaluate the vascular anatomy.  Contrast: OMNIPAQUE IOHEXOL 350 MG/ML SOLN CT of the chest on 08/23/2011  Comparison: CT of the chest 08/23/2011  Findings: The heart is enlarged.  There is poor opacification of the pulmonary arteries.  Although no large pulmonary emboli are identified, small emboli may not be detectable given the contrast bolus.  There is significant hilar and mediastinal adenopathy. There are diffuse parenchymal opacities bilaterally, involving both upper and lower lobes.  Parenchymal opacities are similar to the previous exam.  Adenopathy has progressed since the prior study. Findings are consistent with the patient's known sarcoidosis.  The thyroid gland has a normal appearance.  The heart is enlarged. Images of the upper abdomen are unremarkable.  There are significant degenerative changes in the thoracic spine.  IMPRESSION:  1.  Study is technically inadequate to exclude small pulmonary emboli.  However, no large central pulmonary emboli are identified. 2.  Mediastinal and hilar adenopathy and parenchymal lung changes are most consistent with sarcoidosis.  Adenopathy has progressed since prior study. 3.  Cardiomegaly.   Original Report Authenticated By: Norva Pavlov, M.D.    Dg Chest Port 1 View  09/09/2012  *RADIOLOGY REPORT*  Clinical Data: Shortness of breath, leg pain  PORTABLE CHEST - 1 VIEW  Comparison: 09/26/2011  Findings: The cardiomediastinal silhouette is stable.  There is bilateral airspace disease perihilar region and lower lobes right greater than left.  Evolving pneumonia or asymmetric edema cannot be excluded.  Clinical correlation is necessary.  Follow-up examination is recommended.   IMPRESSION:  There is bilateral airspace disease perihilar region and lower lobes right greater than left.  Evolving pneumonia or asymmetric edema cannot be excluded.  Clinical correlation is necessary. Follow-up examination is recommended.   Original Report Authenticated By: Natasha Mead, M.D.    Dg Knee 4 Views W/patella Left  09/09/2012  *RADIOLOGY REPORT*  Clinical Data: Left knee pain.  LEFT KNEE - COMPLETE 4+ VIEW  Comparison: None.  Findings: Severe tri-compartmental spurring is present. This is most striking in the medial compartment where irregular spurring obscures the joint space.  Spurring is present along the intercondylar notch and tibial spine.  On the lateral projection, a trace knee effusion is present.  IMPRESSION:  1.  Prominent tri-compartmental spurring, most especially in the medial compartment where there is some fragmentation of the spurring. 2.  Trace knee effusion. 3.  No definite fracture, although the degree of spur related cortical irregularity reduces sensitivity for subtle fractures.   Original Report Authenticated By: Gaylyn Rong, M.D.     Microbiology: No results found for this or any previous visit (from the past 240 hour(s)).   Labs: Basic Metabolic Panel:  Lab 09/09/12 3329 09/09/12 1041  NA -- 135  K -- 3.6  CL -- 98  CO2 -- 28  GLUCOSE -- 101*  BUN -- 8  CREATININE 0.49* 0.53  CALCIUM -- 8.9  MG -- --  PHOS -- --   Liver Function Tests: No results found for this basename: AST:5,ALT:5,ALKPHOS:5,BILITOT:5,PROT:5,ALBUMIN:5 in the last 168 hours No results found for this basename: LIPASE:5,AMYLASE:5 in the last 168 hours No results found for this basename: AMMONIA:5  in the last 168 hours CBC:  Lab 09/09/12 1716 09/09/12 1041  WBC 10.3 8.0  NEUTROABS -- 5.9  HGB 9.2* 9.3*  HCT 31.7* 31.7*  MCV 61.0* 61.1*  PLT 271 276   Cardiac Enzymes:  Lab 09/10/12 0510 09/09/12 2221 09/09/12 1720  CKTOTAL -- -- --  CKMB -- -- --  CKMBINDEX -- -- --    TROPONINI <0.30 <0.30 <0.30   BNP: BNP (last 3 results)  Basename 09/09/12 1106  PROBNP 142.1*   CBG: No results found for this basename: GLUCAP:5 in the last 168 hours  Time coordinating discharge: 45 minutes  Signed:  Shellia Hartl  [facility] 09/10/2012, 8:17 AM

## 2012-09-10 NOTE — Care Management Note (Addendum)
    Page 1 of 1   09/10/2012     8:39:14 AM   CARE MANAGEMENT NOTE 09/10/2012  Patient:  [name], [name]   Account Number:  [id number]  Date Initiated:  09/10/2012  Documentation initiated by:  Lanier Clam  Subjective/Objective Assessment:   ADMITTED W/CHEST PRESSURE.HX:HTN     Action/Plan:   FROM HOME   Anticipated DC Date:  09/10/2012   Anticipated DC Plan:  HOME/SELF CARE         Choice offered to / List presented to:             Status of service:  Completed, signed off Medicare Important Message given?   (If response is "NO", the following Medicare IM given date fields will be blank) Date Medicare IM given:   Date Additional Medicare IM given:    Discharge Disposition:  HOME/SELF CARE  Per UR Regulation:    If discussed at Long Length of Stay Meetings, dates discussed:    Comments:  09/10/12 Jamilia Jacques RN,BSN NCM [phone] NO D/C NEEDS.

## 2012-09-12 LAB — ANCA SCREEN W REFLEX TITER
Atypical p-ANCA Screen: NEGATIVE
p-ANCA Screen: NEGATIVE

## 2012-09-17 ENCOUNTER — Other Ambulatory Visit: Payer: Self-pay | Admitting: Family Medicine

## 2012-09-17 DIAGNOSIS — E041 Nontoxic single thyroid nodule: Secondary | ICD-10-CM

## 2012-09-20 ENCOUNTER — Other Ambulatory Visit: Payer: Medicaid Other

## 2012-09-24 ENCOUNTER — Encounter: Payer: Self-pay | Admitting: Adult Health

## 2012-09-24 ENCOUNTER — Ambulatory Visit
Admission: RE | Admit: 2012-09-24 | Discharge: 2012-09-24 | Disposition: A | Discharge status: Home / Self Care | Payer: Medicaid Other | Source: Ambulatory Visit | Attending: Family Medicine | Admitting: Family Medicine

## 2012-09-24 ENCOUNTER — Ambulatory Visit (INDEPENDENT_AMBULATORY_CARE_PROVIDER_SITE_OTHER): Payer: Medicaid Other | Admitting: Adult Health

## 2012-09-24 VITALS — BP 116/88 | HR 108 | Temp 97.1°F | Ht 66.0 in | Wt >= 6400 oz

## 2012-09-24 DIAGNOSIS — R918 Other nonspecific abnormal finding of lung field: Secondary | ICD-10-CM

## 2012-09-24 DIAGNOSIS — E041 Nontoxic single thyroid nodule: Secondary | ICD-10-CM

## 2012-09-24 DIAGNOSIS — G4733 Obstructive sleep apnea (adult) (pediatric): Secondary | ICD-10-CM

## 2012-09-24 NOTE — Assessment & Plan Note (Signed)
We are referring you to rheumatology .  Associated hypoxia complicated by OSA/OHS>advised to wear O2 with activity and At bedtime  -continuous flow at 2 l/M

## 2012-09-24 NOTE — Patient Instructions (Addendum)
Refer to Rheumatology  Wear Oxygen 2 l/m with activity and At bedtime  -continuous flow  follow up Dr. Vassie Loll  In 3-4 weeks and As needed   Please contact office for sooner follow up if symptoms do not improve or worsen or seek emergency care

## 2012-09-24 NOTE — Assessment & Plan Note (Signed)
Cont on CPAP w/ O2  

## 2012-09-24 NOTE — Addendum Note (Signed)
Addended by: Boone Master E on: 09/24/2012 10:25 AM   Modules accepted: Orders

## 2012-09-24 NOTE — Progress Notes (Signed)
  Subjective:    Patient ID: [name], female    DOB: 21-Sep-1966, 46 y.o.   MRN: [id number]  HPI  [name]   46 yo morbidly obese female seen in hospital 01/26/11 for pulmonary consult for acute dyspnea and hypoxia found to have OHS and OSA with recs for continuous O2 ( 2 L/m at rest & 4L/m on walking)and Nocturnal CPAP  She underwent extensive work up with echo showing mild LVH , EF 55-60% . VQ scan intermed prob , doppler neg. Subsequent CT chest was neg for PE.  4/12 -Stopped zestoretic . Had considered gastric bypass 5 yrs ago but did not want laparotomy.  6/12 - BP high -started on losartan -HCTZ  CPAP titration 5/12 (wt 479 lbs) showed CPAP 7 cm to stop snoring, No desaturation ON O2 2 L/min  satn 95% RA   09/13/2011  Hosp adm for hemoptysis & BL infiltrates on CT .  p-anca pos 1.80, gbm neg, ANA neg, ESR 42  Labs c/w fe def anemia, UA pos blood (was having periods) >> rpt UA no RBCs, pANCA pos Was poorly compliant with CPAP since could not lie on her back due to anorectal abscess surgery - now is using every night again.  Hemoptysis has subsided x 1 week  Also endometrial ablation is planned for menorrhagia  10/02/2012 No further hemoptysis TBBX >> no granulomas, BAL neg afb, fungal She is feeling good, hypoxia has resolved. >>labs ordered ? Pt did not go for labs.   09/24/2012 [facility] follow up  Patient returns for a post hospital followup. She was admitted November 10 -11th 2013 for atypical chest pain, and shortness of breath. CT chest was negative for PE, notable, hilar and mediastinal lymphadenopathy consistent with worsening sarcoidosis and cardiomegaly Patient has not been seen in the office in approximately one year. Had a bronchoscopy in November 2012 that showed no granulomas, negative for AFB and fungal She was treated with IV steroids, and discharged on prednisone 20 mg daily She was noticed to have microcytic anemia, and persistent  hematuria C-ANCA  was positive, ANA was positive. ACE level was 21  Since discharge. She is feeling better w/ less dyspnea.  She has stopped prednisone due to weight gain and increased appetite.  Did not feel any different on this.  Main complaint is DOE and dry cough .  Wears O2 at home but does not wear out .  Has portable tank.  Has seen PCP, restarted iron therapy .   Review of Systems  Patient denies  hemoptysis,  chest pain, palpitations, pedal edema, orthopnea, paroxysmal nocturnal dyspnea, lightheadedness, nausea, vomiting, abdominal or  leg pains      Objective:   Physical Exam  Gen. Pleasant, obese, in no distress, normal affect ENT - no lesions, no post nasal drip, class 2-3 airway Neck: No JVD, no thyromegaly, no carotid bruits Lungs: no use of accessory muscles, no dullness to percussion, decreased without rales or rhonchi  Cardiovascular: Rhythm regular, heart sounds  normal, no murmurs or gallops, venous insufficiency, 1+ peripheral edema Abdomen: soft and non-tender, no hepatosplenomegaly, BS normal. Musculoskeletal: No deformities, no cyanosis or clubbing Neuro:  alert, non focal, no tremors        Assessment & Plan:

## 2012-09-25 ENCOUNTER — Other Ambulatory Visit: Payer: Self-pay | Admitting: Family Medicine

## 2012-09-25 DIAGNOSIS — E041 Nontoxic single thyroid nodule: Secondary | ICD-10-CM

## 2012-09-26 ENCOUNTER — Ambulatory Visit
Admission: RE | Admit: 2012-09-26 | Discharge: 2012-09-26 | Disposition: A | Discharge status: Home / Self Care | Payer: Medicaid Other | Source: Ambulatory Visit | Attending: Family Medicine | Admitting: Family Medicine

## 2012-09-26 ENCOUNTER — Other Ambulatory Visit (HOSPITAL_COMMUNITY)
Admission: RE | Admit: 2012-09-26 | Discharge: 2012-09-26 | Disposition: A | Discharge status: Home / Self Care | Payer: Medicaid Other | Source: Ambulatory Visit | Attending: Interventional Radiology | Admitting: Interventional Radiology

## 2012-09-26 DIAGNOSIS — E049 Nontoxic goiter, unspecified: Secondary | ICD-10-CM | POA: Insufficient documentation

## 2012-09-26 DIAGNOSIS — E041 Nontoxic single thyroid nodule: Secondary | ICD-10-CM

## 2013-01-02 DIAGNOSIS — M1710 Unilateral primary osteoarthritis, unspecified knee: Secondary | ICD-10-CM | POA: Diagnosis not present

## 2013-01-10 DIAGNOSIS — R894 Abnormal immunological findings in specimens from other organs, systems and tissues: Secondary | ICD-10-CM | POA: Diagnosis not present

## 2013-01-10 DIAGNOSIS — D649 Anemia, unspecified: Secondary | ICD-10-CM | POA: Diagnosis not present

## 2013-01-10 DIAGNOSIS — R918 Other nonspecific abnormal finding of lung field: Secondary | ICD-10-CM | POA: Diagnosis not present

## 2013-01-10 DIAGNOSIS — M2550 Pain in unspecified joint: Secondary | ICD-10-CM | POA: Diagnosis not present

## 2013-01-10 DIAGNOSIS — M1990 Unspecified osteoarthritis, unspecified site: Secondary | ICD-10-CM | POA: Diagnosis not present

## 2013-02-07 DIAGNOSIS — R3129 Other microscopic hematuria: Secondary | ICD-10-CM | POA: Diagnosis not present

## 2013-02-08 ENCOUNTER — Other Ambulatory Visit (HOSPITAL_COMMUNITY): Payer: Self-pay | Admitting: Urology

## 2013-02-08 DIAGNOSIS — R319 Hematuria, unspecified: Secondary | ICD-10-CM | POA: Diagnosis not present

## 2013-02-08 DIAGNOSIS — R3129 Other microscopic hematuria: Secondary | ICD-10-CM

## 2013-02-12 ENCOUNTER — Ambulatory Visit (HOSPITAL_COMMUNITY)
Admission: RE | Admit: 2013-02-12 | Discharge: 2013-02-12 | Disposition: A | Discharge status: Home / Self Care | Payer: Medicare Other | Source: Ambulatory Visit | Attending: Urology | Admitting: Urology

## 2013-02-12 DIAGNOSIS — M47817 Spondylosis without myelopathy or radiculopathy, lumbosacral region: Secondary | ICD-10-CM | POA: Diagnosis not present

## 2013-02-12 DIAGNOSIS — N200 Calculus of kidney: Secondary | ICD-10-CM | POA: Diagnosis not present

## 2013-02-12 DIAGNOSIS — R3129 Other microscopic hematuria: Secondary | ICD-10-CM

## 2013-03-06 DIAGNOSIS — M19019 Primary osteoarthritis, unspecified shoulder: Secondary | ICD-10-CM | POA: Diagnosis not present

## 2013-04-11 DIAGNOSIS — N200 Calculus of kidney: Secondary | ICD-10-CM | POA: Diagnosis not present

## 2013-04-11 DIAGNOSIS — R3129 Other microscopic hematuria: Secondary | ICD-10-CM | POA: Diagnosis not present

## 2013-04-15 DIAGNOSIS — N939 Abnormal uterine and vaginal bleeding, unspecified: Secondary | ICD-10-CM | POA: Diagnosis not present

## 2013-04-15 DIAGNOSIS — D259 Leiomyoma of uterus, unspecified: Secondary | ICD-10-CM | POA: Diagnosis not present

## 2013-04-15 DIAGNOSIS — N926 Irregular menstruation, unspecified: Secondary | ICD-10-CM | POA: Diagnosis not present

## 2013-05-09 ENCOUNTER — Other Ambulatory Visit: Payer: Self-pay | Admitting: Obstetrics and Gynecology

## 2013-05-14 DIAGNOSIS — N898 Other specified noninflammatory disorders of vagina: Secondary | ICD-10-CM | POA: Diagnosis not present

## 2013-05-15 DIAGNOSIS — M1710 Unilateral primary osteoarthritis, unspecified knee: Secondary | ICD-10-CM | POA: Diagnosis not present

## 2013-05-21 DIAGNOSIS — R319 Hematuria, unspecified: Secondary | ICD-10-CM | POA: Diagnosis not present

## 2013-05-21 DIAGNOSIS — N200 Calculus of kidney: Secondary | ICD-10-CM | POA: Diagnosis not present

## 2013-05-23 DIAGNOSIS — N200 Calculus of kidney: Secondary | ICD-10-CM | POA: Insufficient documentation

## 2013-05-23 DIAGNOSIS — R319 Hematuria, unspecified: Secondary | ICD-10-CM | POA: Insufficient documentation

## 2013-06-19 ENCOUNTER — Telehealth: Payer: Self-pay | Admitting: Adult Health

## 2013-06-19 NOTE — Telephone Encounter (Signed)
LMOM TCB x1 for pt Need to ask pt if she can come in early -- I have 2 hold spots tomorrow morning; or she can come another day if that would be more convenient.  Told pt to ask for me.  Will route to my inbox.

## 2013-06-19 NOTE — Telephone Encounter (Signed)
I spoke with pt. She stated JJ called her yesterday to r/s her appt for tomorrow for either early morning or Friday. I do not see this so will hold until JJ is back from lunch

## 2013-06-20 ENCOUNTER — Ambulatory Visit: Payer: Medicare Other | Admitting: Adult Health

## 2013-06-20 NOTE — Telephone Encounter (Signed)
Pt rescheduled appt for another day 06-25-13. Carron Curie, CMA

## 2013-06-25 ENCOUNTER — Ambulatory Visit: Payer: Medicare Other | Admitting: Adult Health

## 2013-06-27 ENCOUNTER — Ambulatory Visit (INDEPENDENT_AMBULATORY_CARE_PROVIDER_SITE_OTHER): Payer: Medicare Other | Admitting: Adult Health

## 2013-06-27 ENCOUNTER — Encounter: Payer: Self-pay | Admitting: Adult Health

## 2013-06-27 ENCOUNTER — Ambulatory Visit (INDEPENDENT_AMBULATORY_CARE_PROVIDER_SITE_OTHER)
Admission: RE | Admit: 2013-06-27 | Discharge: 2013-06-27 | Disposition: A | Discharge status: Home / Self Care | Payer: Medicare Other | Source: Ambulatory Visit | Attending: Adult Health | Admitting: Adult Health

## 2013-06-27 VITALS — BP 132/84 | HR 94 | Temp 97.7°F | Ht 66.0 in | Wt >= 6400 oz

## 2013-06-27 DIAGNOSIS — R918 Other nonspecific abnormal finding of lung field: Secondary | ICD-10-CM | POA: Diagnosis not present

## 2013-06-27 DIAGNOSIS — G4733 Obstructive sleep apnea (adult) (pediatric): Secondary | ICD-10-CM

## 2013-06-27 DIAGNOSIS — R0602 Shortness of breath: Secondary | ICD-10-CM

## 2013-06-27 NOTE — Patient Instructions (Addendum)
We will obtain an CPAP download.  Continue on CPAP At bedtime  With O2  Wear Oxygen 3l/m with walking  We will check xray today  follow up Dr. Vassie Loll  In 3 months and As needed

## 2013-06-27 NOTE — Assessment & Plan Note (Signed)
Wear CPAP At bedtime   Wt loss  ONO for DME

## 2013-06-27 NOTE — Addendum Note (Signed)
Addended by: Boone Master E on: 06/27/2013 12:38 PM   Modules accepted: Orders

## 2013-06-27 NOTE — Progress Notes (Signed)
Subjective:    Patient ID: [name], female    DOB: 05/12/1966, 47 y.o.   MRN: [id number]  HPI  [name]   47 yo morbidly obese female seen in hospital 01/26/11 for pulmonary consult for acute dyspnea and hypoxia found to have OHS and OSA with recs for continuous O2 ( 2 L/m at rest & 4L/m on walking)and Nocturnal CPAP  She underwent extensive work up with echo showing mild LVH , EF 55-60% . VQ scan intermed prob , doppler neg. Subsequent CT chest was neg for PE.  4/12 -Stopped zestoretic . Had considered gastric bypass 5 yrs ago but did not want laparotomy.  6/12 - BP high -started on losartan -HCTZ  CPAP titration 5/12 (wt 479 lbs) showed CPAP 7 cm to stop snoring, No desaturation ON O2 2 L/min  satn 95% RA   09/13/2011  Hosp adm for hemoptysis & BL infiltrates on CT .  p-anca pos 1.80, gbm neg, ANA neg, ESR 42  Labs c/w fe def anemia, UA pos blood (was having periods) >> rpt UA no RBCs, pANCA pos Was poorly compliant with CPAP since could not lie on her back due to anorectal abscess surgery - now is using every night again.  Hemoptysis has subsided x 1 week  Also endometrial ablation is planned for menorrhagia  10/02/2012 No further hemoptysis TBBX >> no granulomas, BAL neg afb, fungal She is feeling good, hypoxia has resolved. >>labs ordered ? Pt did not go for labs.   09/22/13  [facility] follow up  Patient returns for a post hospital followup. She was admitted November 10 -11th 2013 for atypical chest pain, and shortness of breath. CT chest was negative for PE, notable, hilar and mediastinal lymphadenopathy consistent with worsening sarcoidosis and cardiomegaly Patient has not been seen in the office in approximately one year. Had a bronchoscopy in November 2012 that showed no granulomas, negative for AFB and fungal She was treated with IV steroids, and discharged on prednisone 20 mg daily She was noticed to have microcytic anemia, and persistent  hematuria C-ANCA  was positive, ANA was positive. ACE level was 21  Since discharge. She is feeling better w/ less dyspnea.  She has stopped prednisone due to weight gain and increased appetite.  Did not feel any different on this.  Main complaint is DOE and dry cough .  Wears O2 at home but does not wear out .  Has portable tank.  Has seen PCP, restarted iron therapy .  >>refer to rheumatology   06/27/2013 Follow up  Reports breathing and sleep are doing well. Wearing CPAP w/ 2lpm every night x6-8 hours.  no new complaints. Needs DME O2 qualification for nocturnal O2.   Did not follow up in office as recommeded . Was seen by Dr. [name] at [facility]. Records reveiwed w/ repeat labs showing neg pANCA and cANCA ,ESR 48 and neg ANA , low ACE level (12/2012)   Says she feels good other joint aches.  No cough or hemoptysis. Does get winded with walking any distance , resolves with rest .   Wearing CPAP every night on avg 6hrs  Has O2 with CPAP due  To nocturnal desat despite CPAP.      Review of Systems  Patient denies  hemoptysis,  chest pain, palpitations,   orthopnea, paroxysmal nocturnal dyspnea, lightheadedness, nausea, vomiting, abdominal or  leg pains      Objective:   Physical Exam  Gen. Pleasant, obese, in no  distress, normal affect ENT - no lesions, no post nasal drip, class 2-3 airway Neck: No JVD, no thyromegaly, no carotid bruits Lungs: no use of accessory muscles, no dullness to percussion, decreased without rales or rhonchi  Cardiovascular: Rhythm regular, heart sounds  normal, no murmurs or gallops, venous insufficiency, 1+ peripheral edema Abdomen: soft and non-tender, no hepatosplenomegaly, BS normal. Musculoskeletal: No deformities, no cyanosis or clubbing Neuro:  alert, non focal, no tremors        Assessment & Plan:

## 2013-06-27 NOTE — Assessment & Plan Note (Addendum)
follow up cxr today .  Cont to monitor  Has seen rheumatology w/ repeat labs showing neg ANCA panel  No flare since 08/2012 Check xray today

## 2013-06-28 NOTE — Progress Notes (Signed)
Quick Note:  Called spoke with patient, advised of cxr results / recs as stated by TP. Pt verbalized her understanding and denied any questions. ______ 

## 2013-07-08 DIAGNOSIS — G4733 Obstructive sleep apnea (adult) (pediatric): Secondary | ICD-10-CM | POA: Diagnosis not present

## 2013-07-08 DIAGNOSIS — R0602 Shortness of breath: Secondary | ICD-10-CM | POA: Diagnosis not present

## 2013-07-10 ENCOUNTER — Telehealth: Payer: Self-pay | Admitting: Pulmonary Disease

## 2013-07-10 NOTE — Telephone Encounter (Signed)
Will forward to RA as FYI 

## 2013-07-15 ENCOUNTER — Encounter: Payer: Self-pay | Admitting: Adult Health

## 2013-07-15 NOTE — Telephone Encounter (Signed)
Spoke with patient @ [phone] She states she has spoken with [facility] and she had ONO done last week Has portable tanks already Nothing further needed from patient Will forward to Dr. Vassie Loll as Lorain Childes

## 2013-07-16 ENCOUNTER — Telehealth: Payer: Self-pay | Admitting: Adult Health

## 2013-07-16 DIAGNOSIS — G4733 Obstructive sleep apnea (adult) (pediatric): Secondary | ICD-10-CM

## 2013-07-16 NOTE — Telephone Encounter (Signed)
9.8.14 ONO on CPAP Per TP: no significant desats on CPAP; may d/c O2.  Called spoke with patient, advised of negative ONO results / recs as stated by TP above.  Pt verbalized her understanding and denied any questions.  Pt stated that she hooks her O2 up to the CPAP every night, so she understands to simply skip this step.  Order sent to [facility] simply so they're aware.

## 2013-09-16 ENCOUNTER — Encounter: Payer: Self-pay | Admitting: Family

## 2013-09-16 ENCOUNTER — Ambulatory Visit (INDEPENDENT_AMBULATORY_CARE_PROVIDER_SITE_OTHER): Payer: Medicare Other | Admitting: Family

## 2013-09-16 VITALS — BP 154/90 | HR 109 | Ht 66.0 in | Wt >= 6400 oz

## 2013-09-16 DIAGNOSIS — I10 Essential (primary) hypertension: Secondary | ICD-10-CM | POA: Diagnosis not present

## 2013-09-16 DIAGNOSIS — G4733 Obstructive sleep apnea (adult) (pediatric): Secondary | ICD-10-CM | POA: Diagnosis not present

## 2013-09-16 DIAGNOSIS — Z1231 Encounter for screening mammogram for malignant neoplasm of breast: Secondary | ICD-10-CM

## 2013-09-16 DIAGNOSIS — R739 Hyperglycemia, unspecified: Secondary | ICD-10-CM | POA: Insufficient documentation

## 2013-09-16 DIAGNOSIS — Z23 Encounter for immunization: Secondary | ICD-10-CM

## 2013-09-16 DIAGNOSIS — J99 Respiratory disorders in diseases classified elsewhere: Secondary | ICD-10-CM

## 2013-09-16 DIAGNOSIS — E039 Hypothyroidism, unspecified: Secondary | ICD-10-CM

## 2013-09-16 DIAGNOSIS — R7309 Other abnormal glucose: Secondary | ICD-10-CM

## 2013-09-16 DIAGNOSIS — D869 Sarcoidosis, unspecified: Secondary | ICD-10-CM

## 2013-09-16 DIAGNOSIS — D860 Sarcoidosis of lung: Secondary | ICD-10-CM

## 2013-09-16 LAB — BASIC METABOLIC PANEL WITH GFR
BUN: 14 mg/dL (ref 6–23)
CO2: 25 meq/L (ref 19–32)
Calcium: 9.1 mg/dL (ref 8.4–10.5)
Chloride: 102 meq/L (ref 96–112)
Creatinine, Ser: 0.7 mg/dL (ref 0.4–1.2)
GFR: 120.84 mL/min
Glucose, Bld: 105 mg/dL — ABNORMAL HIGH (ref 70–99)
Potassium: 3.8 meq/L (ref 3.5–5.1)
Sodium: 136 meq/L (ref 135–145)

## 2013-09-16 LAB — CBC WITH DIFFERENTIAL/PLATELET
Eosinophils Relative: 2.8 % (ref 0.0–5.0)
HCT: 31.7 % — ABNORMAL LOW (ref 36.0–46.0)
Lymphs Abs: 1.2 10*3/uL (ref 0.7–4.0)
Monocytes Relative: 6.8 % (ref 3.0–12.0)
Platelets: 191 10*3/uL (ref 150.0–400.0)
RBC: 5.39 Mil/uL — ABNORMAL HIGH (ref 3.87–5.11)
WBC: 8.4 10*3/uL (ref 4.5–10.5)

## 2013-09-16 LAB — HEPATIC FUNCTION PANEL
AST: 15 U/L (ref 0–37)
Albumin: 3.7 g/dL (ref 3.5–5.2)

## 2013-09-16 LAB — LIPID PANEL
Cholesterol: 155 mg/dL (ref 0–200)
LDL Cholesterol: 102 mg/dL — ABNORMAL HIGH (ref 0–99)
Triglycerides: 68 mg/dL (ref 0.0–149.0)
VLDL: 13.6 mg/dL (ref 0.0–40.0)

## 2013-09-16 MED ORDER — LEVOTHYROXINE SODIUM 100 MCG PO TABS
100.0000 ug | ORAL_TABLET | Freq: Every day | ORAL | Status: DC
Start: 2013-09-16 — End: 2016-02-24

## 2013-09-16 MED ORDER — LOSARTAN POTASSIUM-HCTZ 50-12.5 MG PO TABS: 1.0000 | ORAL_TABLET | Freq: Every morning | ORAL | Status: DC

## 2013-09-16 MED ORDER — HYDROCODONE-ACETAMINOPHEN 5-325 MG PO TABS: 1.0000 | ORAL_TABLET | Freq: Three times a day (TID) | ORAL | Status: DC

## 2013-09-16 NOTE — Progress Notes (Signed)
Subjective:    Patient ID: [name], female    DOB: 05/07/1966, 47 y.o.   MRN: [id number]  HPI 47 year old [place] American female, new patient to the practice and to be established. She has a history of hypertension, sarcoidosis, obstructive sleep apnea, osteoarthritis, hypertension, morbid obesity, chronic hematuria, an abnormal Pap smear. She's currently under the care of gynecology for an abnormal Pap smear. She is also seen urology at [facility] for chronic hematuria that they believe is related to an abnormally large stone in the kidney. She sees urology every 2 months. Reports never had a mammogram. She is currently tolerating all of her medications well. Sees pulmonology on a regular basis for sarcoidosis. No history of smoking but a long-standing history of secondhand smoke.   Review of Systems  Constitutional: Negative.   HENT: Negative.   Respiratory: Negative.   Cardiovascular: Negative.   Gastrointestinal: Negative.   Endocrine: Negative.   Genitourinary: Negative.   Neurological: Negative.   Hematological: Negative.   Psychiatric/Behavioral: Negative.    Past Medical History  Diagnosis Date  . Hypertension   . Hypothyroidism   . Anemia   . Morbid obesity   . Gallstones   . Seasonal allergies   . Chronic hyperventilation syndrome     w/ obesity tx with albuterol inhaler and oxygen 2L  . Shortness of breath   . Sleep apnea     uses CPAP machine   . GERD (gastroesophageal reflux disease)     diet controlled - no meds  . H/O hiatal hernia   . Arthritis     hands, shoulders, no meds  . Pneumonia     hoispitalized in 08/2011  . COPD (chronic obstructive pulmonary disease)     uses oxygen 2 L    History   Social History  . Marital Status: Single    Spouse Name: N/A    Number of Children: N/A  . Years of Education: N/A   Occupational History  . unemployeed    Social History Main Topics  . Smoking status: Never Smoker   . Smokeless tobacco:  Never Used  . Alcohol Use: Yes     Comment: occasionally  . Drug Use: No  . Sexual Activity: No   Other Topics Concern  . Not on file   Social History Narrative  . No narrative on file    Past Surgical History  Procedure Laterality Date  . Cholecystectomy  2000  . Cesarean section  1994    x 1  . I and d of abcess  05/2011  . Hysteroscopy w/d&c  12/27/2011    Procedure: DILATATION AND CURETTAGE /HYSTEROSCOPY;  Surgeon: Dorien Chihuahua. Richardson Dopp, MD;  Location: [facility];  Service: Gynecology;;  . Lung biopsy    . Uterine abletion      Family History  Problem Relation Age of Onset  . Diabetes Father   . Diabetes Brother   . Deep vein thrombosis Mother   . Aneurysm Sister     d/o brain aneurysm    No Known Allergies  Current Outpatient Prescriptions on File Prior to Visit  Medication Sig Dispense Refill  . albuterol (VENTOLIN HFA) 108 (90 BASE) MCG/ACT inhaler Inhale 2 puffs into the lungs every 6 (six) hours as needed. For shortness of breath      . Docusate Sodium (DSS) 100 MG CAPS Take 100 mg by mouth 2 (two) times daily as needed.      . ferrous sulfate 325 (65 FE) MG tablet  Take 325 mg by mouth 3 (three) times daily with meals.        . Loratadine (CLARITIN) 10 MG CAPS Take 1 capsule by mouth daily as needed. For allergy symptoms      . triamcinolone (KENALOG) 0.1 % ointment Apply 1 application topically as needed. Applies prior to sun exposure       No current facility-administered medications on file prior to visit.    BP 154/90  Pulse 109  Ht 5\' 6"  (1.676 m)  Wt 470 lb 3.2 oz (213.281 kg)  BMI 75.93 kg/m2  SpO2 96%chart    Objective:   Physical Exam  Constitutional: She is oriented to person, place, and time. She appears well-developed and well-nourished.  Morbidly obese  HENT:  Right Ear: External ear normal.  Left Ear: External ear normal.  Nose: Nose normal.  Mouth/Throat: Oropharynx is clear and moist.  Neck: Normal range of motion. Neck supple. No thyromegaly  present.  Cardiovascular: Normal rate, regular rhythm and normal heart sounds.   Pulmonary/Chest: Effort normal.  Abdominal: Soft. Bowel sounds are normal.  Musculoskeletal: Normal range of motion.  Neurological: She is alert and oriented to person, place, and time.  Skin: Skin is warm and dry.  Psychiatric: She has a normal mood and affect.          Assessment & Plan:  Assessment:  1. Hypertension 2. Sarcoidosis 3. Morbid obesity 4. Osteoarthritis 5. Chronic hematuria-seeing [facility] 6. History of abnormal Pap smear-under the care of gynecology  Plan: Continue current medications. Labs sent today to include BMP, CBC, LFTs, TSH notify patient of results. Encouraged healthy diet, exercise, monthly self breast exams. Schedule for mammogram screening. Follow up with specialist.

## 2013-09-16 NOTE — Patient Instructions (Signed)
Exercise to Stay Healthy Exercise helps you become and stay healthy. EXERCISE IDEAS AND TIPS Choose exercises that:  You enjoy.  Fit into your day. You do not need to exercise really hard to be healthy. You can do exercises at a slow or medium level and stay healthy. You can:  Stretch before and after working out.  Try yoga, Pilates, or tai chi.  Lift weights.  Walk fast, swim, jog, run, climb stairs, bicycle, dance, or rollerskate.  Take aerobic classes. Exercises that burn about 150 calories:  Running 1  miles in 15 minutes.  Playing volleyball for 45 to 60 minutes.  Washing and waxing a car for 45 to 60 minutes.  Playing touch football for 45 minutes.  Walking 1  miles in 35 minutes.  Pushing a stroller 1  miles in 30 minutes.  Playing basketball for 30 minutes.  Raking leaves for 30 minutes.  Bicycling 5 miles in 30 minutes.  Walking 2 miles in 30 minutes.  Dancing for 30 minutes.  Shoveling snow for 15 minutes.  Swimming laps for 20 minutes.  Walking up stairs for 15 minutes.  Bicycling 4 miles in 15 minutes.  Gardening for 30 to 45 minutes.  Jumping rope for 15 minutes.  Washing windows or floors for 45 to 60 minutes. Document Released: 11/19/2010 Document Revised: 01/09/2012 Document Reviewed: 11/19/2010 ExitCare Patient Information 2014 [employer].  

## 2013-09-16 NOTE — Progress Notes (Signed)
Pre visit review using our clinic review tool, if applicable. No additional management support is needed unless otherwise documented below in the visit note. 

## 2013-09-23 DIAGNOSIS — M1710 Unilateral primary osteoarthritis, unspecified knee: Secondary | ICD-10-CM | POA: Diagnosis not present

## 2013-10-10 ENCOUNTER — Ambulatory Visit: Payer: Medicare Other | Admitting: Adult Health

## 2013-11-07 ENCOUNTER — Ambulatory Visit: Payer: Medicare Other | Admitting: Adult Health

## 2013-11-07 ENCOUNTER — Ambulatory Visit: Payer: Medicare Other

## 2013-11-11 ENCOUNTER — Ambulatory Visit (INDEPENDENT_AMBULATORY_CARE_PROVIDER_SITE_OTHER): Payer: Medicare Other | Admitting: Adult Health

## 2013-11-11 ENCOUNTER — Encounter: Payer: Self-pay | Admitting: Adult Health

## 2013-11-11 VITALS — BP 134/84 | HR 105 | Temp 98.4°F | Ht 66.0 in | Wt >= 6400 oz

## 2013-11-11 DIAGNOSIS — G4733 Obstructive sleep apnea (adult) (pediatric): Secondary | ICD-10-CM

## 2013-11-11 DIAGNOSIS — R918 Other nonspecific abnormal finding of lung field: Secondary | ICD-10-CM | POA: Diagnosis not present

## 2013-11-11 NOTE — Patient Instructions (Signed)
We will obtain an CPAP download.  Continue on CPAP At bedtime  Wear Oxygen 3l/m with walking  Follow up Dr. Elsworth Soho  In 3 months and As needed

## 2013-11-11 NOTE — Assessment & Plan Note (Signed)
Cont on CPAP At bedtime   Wt loss

## 2013-11-11 NOTE — Assessment & Plan Note (Signed)
Clinically asymptomatic  ? Sarcoid  cxr in 05/2013 with no change  May need repeat CT at some time in future.  Prev TBBX with no granulomas , repeat ANCA panel was neg. Consider spirometry on return .

## 2013-11-11 NOTE — Progress Notes (Signed)
Subjective:    Patient ID: [name], female    DOB: Jan 10, 1966, 48 y.o.   MRN: [id number]  HPI  [name]   48 yo morbidly obese female seen in hospital 01/26/11 for pulmonary consult for acute dyspnea and hypoxia found to have OHS and OSA with recs for continuous O2 ( 2 L/m at rest & 4L/m on walking)and Nocturnal CPAP  She underwent extensive work up with echo showing mild LVH , EF 55-60% . VQ scan intermed prob , doppler neg. Subsequent CT chest was neg for PE.  4/12 -Stopped zestoretic . Had considered gastric bypass 5 yrs ago but did not want laparotomy.  6/12 - BP high -started on losartan -HCTZ  CPAP titration 5/12 (wt 479 lbs) showed CPAP 7 cm to stop snoring, No desaturation ON O2 2 L/min  satn 95% RA   09/13/2011  Hosp adm for hemoptysis & BL infiltrates on CT .  p-anca pos 1.80, gbm neg, ANA neg, ESR 42  Labs c/w fe def anemia, UA pos blood (was having periods) >> rpt UA no RBCs, pANCA pos Was poorly compliant with CPAP since could not lie on her back due to anorectal abscess surgery - now is using every night again.  Hemoptysis has subsided x 1 week  Also endometrial ablation is planned for menorrhagia  10/02/2012 No further hemoptysis TBBX >> no granulomas, BAL neg afb, fungal She is feeling good, hypoxia has resolved. >>labs ordered ? Pt did not go for labs.   09/22/13  [facility] follow up  Patient returns for a post hospital followup. She was admitted November 10 -11th 2013 for atypical chest pain, and shortness of breath. CT chest was negative for PE, notable, hilar and mediastinal lymphadenopathy consistent with worsening sarcoidosis and cardiomegaly Patient has not been seen in the office in approximately one year. Had a bronchoscopy in November 2012 that showed no granulomas, negative for AFB and fungal She was treated with IV steroids, and discharged on prednisone 20 mg daily She was noticed to have microcytic anemia, and persistent  hematuria C-ANCA  was positive, ANA was positive. ACE level was 21  Since discharge. She is feeling better w/ less dyspnea.  She has stopped prednisone due to weight gain and increased appetite.  Did not feel any different on this.  Main complaint is DOE and dry cough .  Wears O2 at home but does not wear out .  Has portable tank.  Has seen PCP, restarted iron therapy .  >>refer to rheumatology   06/27/13  Follow up  Reports breathing and sleep are doing well. Wearing CPAP w/ 2lpm every night x6-8 hours.  no new complaints. Needs DME O2 qualification for nocturnal O2.   Did not follow up in office as recommeded . Was seen by Dr. [name] at [facility]. Records reveiwed w/ repeat labs showing neg pANCA and cANCA ,ESR 48 and neg ANA , low ACE level (12/2012)   Says she feels good other joint aches.  No cough or hemoptysis. Does get winded with walking any distance , resolves with rest .   Wearing CPAP every night on avg 6hrs  Has O2 with CPAP due  To nocturnal desat despite CPAP.  >>no changes   11/11/2013 Follow up  5 month follow up OSA. Reports is doing well on CPAP.  Wears 7d/wk, 6-8 hrs.  No longer wearing O2 w/ CPAP. ONO showed no desats on CPAP .  Last cxr showed stable bilateral perihilar /  lower lobe opacities.  No flare in cough, wheezing or dyspnea. No rash.     Review of Systems  Patient denies  hemoptysis,  chest pain, palpitations,   orthopnea, paroxysmal nocturnal dyspnea, lightheadedness, nausea, vomiting, abdominal or  leg pains      Objective:   Physical Exam  Gen. Pleasant, obese, in no distress, normal affect ENT - no lesions, no post nasal drip, class 2-3 airway Neck: No JVD, no thyromegaly, no carotid bruits Lungs: no use of accessory muscles, no dullness to percussion, decreased without rales or rhonchi  Cardiovascular: Rhythm regular, heart sounds  normal, no murmurs or gallops, venous insufficiency, 1+ peripheral edema Abdomen: soft and  non-tender, no hepatosplenomegaly, BS normal. Musculoskeletal: No deformities, no cyanosis or clubbing Neuro:  alert, non focal, no tremors        Assessment & Plan:

## 2013-11-20 ENCOUNTER — Telehealth: Payer: Self-pay | Admitting: Family

## 2013-11-20 ENCOUNTER — Encounter: Payer: Self-pay | Admitting: *Deleted

## 2013-11-20 DIAGNOSIS — Z79899 Other long term (current) drug therapy: Secondary | ICD-10-CM | POA: Diagnosis not present

## 2013-11-20 MED ORDER — HYDROCODONE-ACETAMINOPHEN 5-325 MG PO TABS: 1.0000 | ORAL_TABLET | Freq: Three times a day (TID) | ORAL | Status: DC | PRN

## 2013-11-20 NOTE — Telephone Encounter (Signed)
This time only

## 2013-11-20 NOTE — Telephone Encounter (Signed)
Ok to fill 

## 2013-11-20 NOTE — Telephone Encounter (Signed)
Pt needs new rx hydrocodone °

## 2013-11-27 ENCOUNTER — Ambulatory Visit: Payer: Medicare Other

## 2013-12-04 ENCOUNTER — Encounter (HOSPITAL_COMMUNITY): Payer: Self-pay | Admitting: Emergency Medicine

## 2013-12-04 ENCOUNTER — Emergency Department (HOSPITAL_COMMUNITY)
Admission: EM | Admit: 2013-12-04 | Discharge: 2013-12-04 | Disposition: A | Discharge status: Home / Self Care | Payer: Medicare Other | Attending: Emergency Medicine | Admitting: Emergency Medicine

## 2013-12-04 ENCOUNTER — Emergency Department (HOSPITAL_COMMUNITY): Payer: Medicare Other

## 2013-12-04 ENCOUNTER — Telehealth: Payer: Self-pay | Admitting: Adult Health

## 2013-12-04 DIAGNOSIS — IMO0001 Reserved for inherently not codable concepts without codable children: Secondary | ICD-10-CM | POA: Insufficient documentation

## 2013-12-04 DIAGNOSIS — K219 Gastro-esophageal reflux disease without esophagitis: Secondary | ICD-10-CM | POA: Insufficient documentation

## 2013-12-04 DIAGNOSIS — Z79899 Other long term (current) drug therapy: Secondary | ICD-10-CM | POA: Diagnosis not present

## 2013-12-04 DIAGNOSIS — J449 Chronic obstructive pulmonary disease, unspecified: Secondary | ICD-10-CM | POA: Diagnosis not present

## 2013-12-04 DIAGNOSIS — D649 Anemia, unspecified: Secondary | ICD-10-CM | POA: Insufficient documentation

## 2013-12-04 DIAGNOSIS — G4733 Obstructive sleep apnea (adult) (pediatric): Secondary | ICD-10-CM | POA: Diagnosis not present

## 2013-12-04 DIAGNOSIS — M129 Arthropathy, unspecified: Secondary | ICD-10-CM | POA: Insufficient documentation

## 2013-12-04 DIAGNOSIS — M79609 Pain in unspecified limb: Secondary | ICD-10-CM | POA: Diagnosis not present

## 2013-12-04 DIAGNOSIS — J4489 Other specified chronic obstructive pulmonary disease: Secondary | ICD-10-CM | POA: Insufficient documentation

## 2013-12-04 DIAGNOSIS — E873 Alkalosis: Secondary | ICD-10-CM | POA: Insufficient documentation

## 2013-12-04 DIAGNOSIS — E039 Hypothyroidism, unspecified: Secondary | ICD-10-CM | POA: Insufficient documentation

## 2013-12-04 DIAGNOSIS — I10 Essential (primary) hypertension: Secondary | ICD-10-CM | POA: Diagnosis not present

## 2013-12-04 DIAGNOSIS — M791 Myalgia, unspecified site: Secondary | ICD-10-CM

## 2013-12-04 LAB — CBC
HEMATOCRIT: 28.7 % — AB (ref 36.0–46.0)
HEMOGLOBIN: 7.9 g/dL — AB (ref 12.0–15.0)
MCH: 16.3 pg — ABNORMAL LOW (ref 26.0–34.0)
MCHC: 27.5 g/dL — ABNORMAL LOW (ref 30.0–36.0)
MCV: 59.2 fL — AB (ref 78.0–100.0)
Platelets: 227 10*3/uL (ref 150–400)
RBC: 4.85 MIL/uL (ref 3.87–5.11)
RDW: 21.9 % — AB (ref 11.5–15.5)
WBC: 8.5 10*3/uL (ref 4.0–10.5)

## 2013-12-04 LAB — BASIC METABOLIC PANEL
BUN: 14 mg/dL (ref 6–23)
CHLORIDE: 100 meq/L (ref 96–112)
CO2: 26 meq/L (ref 19–32)
CREATININE: 0.62 mg/dL (ref 0.50–1.10)
Calcium: 8.9 mg/dL (ref 8.4–10.5)
GFR calc non Af Amer: >90 mL/min (ref >90)
GLUCOSE: 99 mg/dL (ref 70–99)
POTASSIUM: 4 meq/L (ref 3.7–5.3)
Sodium: 138 mEq/L (ref 137–147)

## 2013-12-04 LAB — CK: Total CK: 44 U/L (ref 7–177)

## 2013-12-04 LAB — PRO B NATRIURETIC PEPTIDE: Pro B Natriuretic peptide (BNP): 93.1 pg/mL (ref 0–125)

## 2013-12-04 MED ORDER — OXYCODONE-ACETAMINOPHEN 5-325 MG PO TABS
2.0000 | ORAL_TABLET | Freq: Once | ORAL | Status: AC
  Administered 2013-12-04: 2 via ORAL
  Filled 2013-12-04: qty 2

## 2013-12-04 MED ORDER — OXYCODONE-ACETAMINOPHEN 5-325 MG PO TABS: 1.0000 | ORAL_TABLET | Freq: Four times a day (QID) | ORAL | Status: DC | PRN

## 2013-12-04 NOTE — Discharge Instructions (Signed)
Musculoskeletal Pain °Musculoskeletal pain is muscle and boney aches and pains. These pains can occur in any part of the body. Your caregiver may treat you without knowing the cause of the pain. They may treat you if blood or urine tests, X-rays, and other tests were normal.  °CAUSES °There is often not a definite cause or reason for these pains. These pains may be caused by a type of germ (virus). The discomfort may also come from overuse. Overuse includes working out too hard when your body is not fit. Boney aches also come from weather changes. Bone is sensitive to atmospheric pressure changes. °HOME CARE INSTRUCTIONS  °· Ask when your test results will be ready. Make sure you get your test results. °· Only take over-the-counter or prescription medicines for pain, discomfort, or fever as directed by your caregiver. If you were given medications for your condition, do not drive, operate machinery or power tools, or sign legal documents for 24 hours. Do not drink alcohol. Do not take sleeping pills or other medications that may interfere with treatment. °· Continue all activities unless the activities cause more pain. When the pain lessens, slowly resume normal activities. Gradually increase the intensity and duration of the activities or exercise. °· During periods of severe pain, bed rest may be helpful. Lay or sit in any position that is comfortable. °· Putting ice on the injured area. °· Put ice in a bag. °· Place a towel between your skin and the bag. °· Leave the ice on for 15 to 20 minutes, 3 to 4 times a day. °· Follow up with your caregiver for continued problems and no reason can be found for the pain. If the pain becomes worse or does not go away, it may be necessary to repeat tests or do additional testing. Your caregiver may need to look further for a possible cause. °SEEK IMMEDIATE MEDICAL CARE IF: °· You have pain that is getting worse and is not relieved by medications. °· You develop chest pain  that is associated with shortness or breath, sweating, feeling sick to your stomach (nauseous), or throw up (vomit). °· Your pain becomes localized to the abdomen. °· You develop any new symptoms that seem different or that concern you. °MAKE SURE YOU:  °· Understand these instructions. °· Will watch your condition. °· Will get help right away if you are not doing well or get worse. °Document Released: 10/17/2005 Document Revised: 01/09/2012 Document Reviewed: 06/21/2013 °ExitCare® Patient Information ©2014 [employer]. ° °

## 2013-12-04 NOTE — ED Notes (Addendum)
Per EMS, pt has had arm and leg pain that has gotten gradually worse for the past 10 days. Pt states she does not know what caused the pain, although she does have a hx of arthritis.

## 2013-12-04 NOTE — ED Notes (Signed)
Pt in XRAY 

## 2013-12-04 NOTE — ED Provider Notes (Signed)
CSN: [id number]     Arrival date & time 12/04/13  1814 History   First MD Initiated Contact with Patient 12/04/13 1822     Chief Complaint  Patient presents with  . Leg Pain  . Arm Pain   (Consider location/radiation/quality/duration/timing/severity/associated sxs/prior Treatment) Patient is a 48 y.o. female presenting with leg pain and arm pain. The history is provided by the patient.  Leg Pain Location:  Leg Time since incident:  10 days Injury: no   Leg location:  L leg, R leg, R upper leg, L upper leg, L lower leg and R lower leg Pain details:    Quality:  Aching   Radiates to:  Does not radiate   Severity:  Mild   Onset quality:  Gradual   Duration:  10 days   Timing:  Constant   Progression:  Worsening Chronicity:  New Dislocation: no   Relieved by:  Nothing Worsened by:  Nothing tried Associated symptoms: stiffness   Associated symptoms: no back pain, no fever, no neck pain, no swelling and no tingling   Arm Pain Pertinent negatives include no abdominal pain and no shortness of breath.    Past Medical History  Diagnosis Date  . Hypertension   . Hypothyroidism   . Anemia   . Morbid obesity   . Gallstones   . Seasonal allergies   . Chronic hyperventilation syndrome     w/ obesity tx with albuterol inhaler and oxygen 2L  . Shortness of breath   . Sleep apnea     uses CPAP machine   . GERD (gastroesophageal reflux disease)     diet controlled - no meds  . H/O hiatal hernia   . Arthritis     hands, shoulders, no meds  . Pneumonia     hoispitalized in 08/2011  . COPD (chronic obstructive pulmonary disease)     uses oxygen 2 L   Past Surgical History  Procedure Laterality Date  . Cholecystectomy  2000  . Cesarean section  1994    x 1  . I and d of abcess  05/2011  . Hysteroscopy w/d&c  12/27/2011    Procedure: DILATATION AND CURETTAGE /HYSTEROSCOPY;  Surgeon: Maeola Sarah. Landry Mellow, MD;  Location: [facility];  Service: Gynecology;;  . Lung biopsy    . Uterine abletion      Family History  Problem Relation Age of Onset  . Diabetes Father   . Diabetes Brother   . Deep vein thrombosis Mother   . Aneurysm Sister     d/o brain aneurysm   History  Substance Use Topics  . Smoking status: Never Smoker   . Smokeless tobacco: Never Used  . Alcohol Use: Yes     Comment: occasionally   OB History   Grav Para Term Preterm Abortions TAB SAB Ect Mult Living                 Review of Systems  Constitutional: Positive for chills. Negative for fever.  Respiratory: Negative for cough and shortness of breath.   Cardiovascular: Negative for leg swelling.  Gastrointestinal: Negative for vomiting and abdominal pain.  Musculoskeletal: Positive for stiffness. Negative for back pain and neck pain.  All other systems reviewed and are negative.    Allergies  Review of patient's allergies indicates no known allergies.  Home Medications   Current Outpatient Rx  Name  Route  Sig  Dispense  Refill  . albuterol (VENTOLIN HFA) 108 (90 BASE) MCG/ACT inhaler   Inhalation  Inhale 2 puffs into the lungs every 6 (six) hours as needed. For shortness of breath         . ferrous sulfate 325 (65 FE) MG tablet   Oral   Take 325 mg by mouth 3 (three) times daily with meals.           Marland Kitchen HYDROcodone-acetaminophen (NORCO/VICODIN) 5-325 MG per tablet   Oral   Take 1 tablet by mouth 3 (three) times daily as needed.   30 tablet   0   . levothyroxine (SYNTHROID, LEVOTHROID) 100 MCG tablet   Oral   Take 1 tablet (100 mcg total) by mouth daily at 6 (six) AM.   30 tablet   4   . losartan-hydrochlorothiazide (HYZAAR) 50-12.5 MG per tablet   Oral   Take 1 tablet by mouth every morning.   30 tablet   4   . NON FORMULARY      2 liter of oxygen         . triamcinolone (KENALOG) 0.1 % ointment   Topical   Apply 1 application topically as needed. Applies prior to sun exposure          BP 160/77  Pulse 87  Temp(Src) 98.6 F (37 C) (Oral)  Resp 18  SpO2 100%   LMP 11/27/2013 Physical Exam  Nursing note and vitals reviewed. Constitutional: She is oriented to person, place, and time. She appears well-developed and well-nourished. No distress.  HENT:  Head: Normocephalic and atraumatic.  Eyes: EOM are normal. Pupils are equal, round, and reactive to light.  Neck: Normal range of motion. Neck supple.  Cardiovascular: Normal rate and regular rhythm.  Exam reveals no friction rub.   No murmur heard. Pulmonary/Chest: Effort normal and breath sounds normal. No respiratory distress. She has no wheezes. She has no rales.  Abdominal: Soft. She exhibits no distension. There is no tenderness. There is no rebound.  Musculoskeletal: She exhibits no edema.       Right shoulder: She exhibits decreased range of motion and tenderness (mild, diffuse).       Left shoulder: She exhibits decreased range of motion and tenderness (mild, diffuse).       Right knee: Tenderness (mild, diffuse) found.       Left knee: Tenderness (mild, diffuse) found.       Right lower leg: She exhibits tenderness (lateral leg).       Left lower leg: She exhibits tenderness (lateral leg).       Legs: Neurological: She is alert and oriented to person, place, and time. No cranial nerve deficit. She exhibits normal muscle tone.  Skin: Skin is warm. No rash noted. She is not diaphoretic.    ED Course  Procedures (including critical care time) Labs Review Labs Reviewed  CBC - Abnormal; Notable for the following:    Hemoglobin 7.9 (*)    HCT 28.7 (*)    MCV 59.2 (*)    MCH 16.3 (*)    MCHC 27.5 (*)    RDW 21.9 (*)    All other components within normal limits  BASIC METABOLIC PANEL  CK  PRO B NATRIURETIC PEPTIDE   Imaging Review Dg Chest 2 View  12/04/2013   CLINICAL DATA:  Chills  EXAM: CHEST  2 VIEW  COMPARISON:  June 27, 2013  FINDINGS: Heart is moderately enlarged with mild pulmonary venous hypertension. Lungs are clear. No adenopathy. No effusions. No bone lesions. There is  degenerative change in the thoracic spine.  IMPRESSION:  Cardiomegaly with a degree of pulmonary venous hypertension. Suspect a degree of volume overload. No frank edema or consolidation, however.   Electronically Signed   By: Lowella Grip M.D.   On: 12/04/2013 19:31    EKG Interpretation   None       MDM   1. Myalgia    11F presents with leg pain. Progressively worsening for 10 days. Patient has hx of arthritis, has appointment to get cortisone injection in knees soon. Patient denies trauma. Aching in shoulders also. Orthopedics informed her she might have CTS. She has seen a Rheumatologist for her pains, however doesn't have any lab results back. Her pains are worse in the morning, improve throughout the day. She is morbidly obese, which is not helping her arthritis. Symptoms sound c/w some type of rheumatoid disease. Will check basic labs, CK.  Labs show anemia - hx of same. Never required blood transfusion. Refused rectal. Instructed to take her iron pills and f/u with PCP. CXR shows enlarged heart. Likely due to obesity. BNP normal.   Osvaldo Shipper, MD 12/04/13 [phone]

## 2013-12-04 NOTE — Telephone Encounter (Signed)
lmtcb x1 to advise [facility] trying to contact her

## 2013-12-06 NOTE — Telephone Encounter (Signed)
I will close since letter was sent. [place], CMA

## 2013-12-09 ENCOUNTER — Encounter: Payer: Self-pay | Admitting: Family

## 2013-12-09 ENCOUNTER — Ambulatory Visit (INDEPENDENT_AMBULATORY_CARE_PROVIDER_SITE_OTHER): Payer: Medicare Other | Admitting: Family

## 2013-12-09 VITALS — BP 142/82 | HR 93 | Ht 66.0 in | Wt >= 6400 oz

## 2013-12-09 DIAGNOSIS — M1710 Unilateral primary osteoarthritis, unspecified knee: Secondary | ICD-10-CM | POA: Diagnosis not present

## 2013-12-09 DIAGNOSIS — M159 Polyosteoarthritis, unspecified: Secondary | ICD-10-CM

## 2013-12-09 DIAGNOSIS — D259 Leiomyoma of uterus, unspecified: Secondary | ICD-10-CM | POA: Diagnosis not present

## 2013-12-09 DIAGNOSIS — D500 Iron deficiency anemia secondary to blood loss (chronic): Secondary | ICD-10-CM

## 2013-12-09 DIAGNOSIS — D219 Benign neoplasm of connective and other soft tissue, unspecified: Secondary | ICD-10-CM

## 2013-12-09 NOTE — Progress Notes (Signed)
Pre visit review using our clinic review tool, if applicable. No additional management support is needed unless otherwise documented below in the visit note. 

## 2013-12-09 NOTE — Patient Instructions (Signed)
Iron Deficiency Anemia, Adult  Anemia is a condition in which there are less red blood cells or hemoglobin in the blood than normal. Hemoglobin is this part of red blood cells that carries oxygen. Iron deficiency anemia is anemia caused by too little iron. It is the most common type of anemia. It may leave you tired and short of breath.  CAUSES    Lack of iron in the diet.   Poor absorption of iron, as seen with intestinal disorders.   Intestinal bleeding.   Heavy periods.  SIGNS AND SYMPTOMS   Mild anemia may not be noticeable. Symptoms may include:   Fatigue.   Headache.   Pale skin.   Weakness.   Tiredness.   Shortness of breath.   Dizziness.   Cold hands and feet.   Fast or irregular heartbeat.  DIAGNOSIS   Diagnosis requires a thorough evaluation and physical exam by your health care provider. Blood tests are generally used to confirm iron deficiency anemia. Additional tests may be done to find the underlying cause of your anemia. These may include:   Testing for blood in the stool (fecal occult blood test).   A procedure to see inside the colon and rectum (colonoscopy).   A procedure to see inside the esophagus and stomach (endoscopy).  TREATMENT   Iron deficiency anemia is treated by correcting the cause of the deficiency. Treatment may involve:   Adding iron-rich foods to your diet.   Taking iron supplements. Pregnant or breastfeeding women need to take extra iron, because their normal diet usually does not provide the required amount.   Taking vitamins. Vitamin C improves the absorption of iron. Your health care provider may recommend taking your iron tablets with a glass of orange juice or vitamin C supplement.   Medicines to make heavy menstrual flow lighter.   Surgery.  HOME CARE INSTRUCTIONS    Take iron as directed by your health care provider.   If you cannot tolerate taking iron supplements by mouth, talk to your health care provider about taking them through a vein  (intravenously) or an injection into a muscle.   For the best iron absorption, iron supplements should be taken on an empty stomach. If you cannot tolerate them on an empty stomach, you may need to take them with food.   Do not drink milk or take antacids at the same time as your iron supplements. Milk and antacids may interfere with the absorption of iron.   Iron supplements can cause constipation. Make sure to include fiber in your diet to prevent constipation. A stool softener may also be recommended.   Take vitamins as directed by your health care provider.   Eat a diet rich in iron. Foods high in iron include liver, lean beef, whole-grain bread, eggs, dried fruit, and dark green, leafy vegetables.  SEEK IMMEDIATE MEDICAL CARE IF:    You faint. If this happens, do not drive. Call your local emergency services (911 in [place] if no other help is available.   You have chest pain.   You feel nauseous or vomit.   You have severe or increased shortness of breath with activity.   You feel weak.   You have a rapid heartbeat.   You have unexplained sweating.   You become lightheaded when getting up from a chair or bed.  MAKE SURE YOU:    Understand these instructions.   Will watch your condition.   Will get help right away if you are not 

## 2013-12-09 NOTE — Progress Notes (Signed)
Subjective:    Patient ID: [name], female    DOB: May 20, 1966, 48 y.o.   MRN: [id number]  HPI 48 year old [place] female, nonsmoker for follow-up appointment after being seen in emergency room at [facility] on the February 4th.  Patient had multiple complaints of muscular pain.  She was unable to open and close her hands, move her arms or legs.  She was completely out of pain medication.  At that time, her hemoglobin was 7.9.    She was nauseous when taking her Iron pills and stopped taking them. She has started taking her Iron since leaving the hospital.  She has a history of Fibroids.  Patient feeling depressed due to best friend dying one week ago.  Scheduled for cortisone injections at 3:45 at orthopedic clinic   Review of Systems  Respiratory: Positive for shortness of breath.        SOB on exertion and uses oxygen at home  Cardiovascular: Negative.   Gastrointestinal: Negative.   Genitourinary: Positive for menstrual problem.       Heavy menstraul bleeding due to fibroids  Musculoskeletal: Positive for arthralgias.       Knee pain   Skin: Negative.   Neurological: Positive for weakness.       Increased weakness due to low iron  Hematological: Negative for adenopathy. Does not bruise/bleed easily.       Low hemoglobin  Psychiatric/Behavioral: Negative for sleep disturbance. The patient is not nervous/anxious and is not hyperactive.        Depression due to best friend dying last week   Past Medical History  Diagnosis Date  . Hypertension   . Hypothyroidism   . Anemia   . Morbid obesity   . Gallstones   . Seasonal allergies   . Chronic hyperventilation syndrome     w/ obesity tx with albuterol inhaler and oxygen 2L  . Shortness of breath   . Sleep apnea     uses CPAP machine   . GERD (gastroesophageal reflux disease)     diet controlled - no meds  . H/O hiatal hernia   . Arthritis     hands, shoulders, no meds  . Pneumonia     hoispitalized in 08/2011  .  COPD (chronic obstructive pulmonary disease)     uses oxygen 2 L    History   Social History  . Marital Status: Single    Spouse Name: N/A    Number of Children: N/A  . Years of Education: N/A   Occupational History  . unemployeed    Social History Main Topics  . Smoking status: Never Smoker   . Smokeless tobacco: Never Used  . Alcohol Use: Yes     Comment: occasionally  . Drug Use: No  . Sexual Activity: No   Other Topics Concern  . Not on file   Social History Narrative  . No narrative on file    Past Surgical History  Procedure Laterality Date  . Cholecystectomy  2000  . Cesarean section  1994    x 1  . I and d of abcess  05/2011  . Hysteroscopy w/d&c  12/27/2011    Procedure: DILATATION AND CURETTAGE /HYSTEROSCOPY;  Surgeon: Maeola Sarah. Landry Mellow, MD;  Location: [facility];  Service: Gynecology;;  . Lung biopsy    . Uterine abletion      Family History  Problem Relation Age of Onset  . Diabetes Father   . Diabetes Brother   .  Deep vein thrombosis Mother   . Aneurysm Sister     d/o brain aneurysm    No Known Allergies  Current Outpatient Prescriptions on File Prior to Visit  Medication Sig Dispense Refill  . acetaminophen (TYLENOL) 325 MG tablet Take 650 mg by mouth every 6 (six) hours as needed for mild pain.      Marland Kitchen acetaminophen-codeine (TYLENOL #3) 300-30 MG per tablet Take 1 tablet by mouth every 8 (eight) hours as needed for moderate pain.      Marland Kitchen albuterol (VENTOLIN HFA) 108 (90 BASE) MCG/ACT inhaler Inhale 2 puffs into the lungs every 6 (six) hours as needed. For shortness of breath      . ferrous sulfate 325 (65 FE) MG tablet Take 325 mg by mouth 3 (three) times daily with meals.        Marland Kitchen HYDROcodone-acetaminophen (NORCO/VICODIN) 5-325 MG per tablet Take 1 tablet by mouth 3 (three) times daily as needed.  30 tablet  0  . levothyroxine (SYNTHROID, LEVOTHROID) 100 MCG tablet Take 1 tablet (100 mcg total) by mouth daily at 6 (six) AM.  30 tablet  4  .  losartan-hydrochlorothiazide (HYZAAR) 50-12.5 MG per tablet Take 1 tablet by mouth every morning.  30 tablet  4  . meloxicam (MOBIC) 7.5 MG tablet Take 7.5 mg by mouth 3 (three) times daily.      . methocarbamol (ROBAXIN) 500 MG tablet Take 500 mg by mouth every 6 (six) hours as needed for muscle spasms.      . naproxen sodium (ANAPROX) 220 MG tablet Take 440 mg by mouth 2 (two) times daily as needed (pain).      . NON FORMULARY 2 liter of oxygen      . oxyCODONE-acetaminophen (PERCOCET) 5-325 MG per tablet Take 1 tablet by mouth every 6 (six) hours as needed for moderate pain.  10 tablet  0  . triamcinolone (KENALOG) 0.1 % ointment Apply 1 application topically as needed. Applies prior to sun exposure       No current facility-administered medications on file prior to visit.    BP 142/82  Pulse 93  Ht 5\' 6"  (1.676 m)  Wt 464 lb (210.469 kg)  BMI 74.93 kg/m2  LMP 01/28/2015chart    Objective:   Physical Exam  Constitutional: She is oriented to person, place, and time. She appears well-developed and well-nourished.  Morbidly Obese  Neck: Normal range of motion. Neck supple. No thyromegaly present.  Cardiovascular: Normal rate, regular rhythm and normal heart sounds.   Pulmonary/Chest: Effort normal and breath sounds normal.  Abdominal: Soft. Bowel sounds are normal.  Musculoskeletal: She exhibits no edema.  Difficulty with mobility due to obesity and pain.  No tenderness to palpation of the knees. No swelling. No erythema.   Neurological: She is alert and oriented to person, place, and time.  Skin: Skin is warm and dry.  Psychiatric: She has a normal mood and affect.  Teary eyed           Assessment & Plan:  Assessment  1. Osteoarthritis-generalized 2. Iron Deficiency Anemia 3. Anemia from Blood Loss  Plan 1.  Continue with Iron as prescribed. 2. Obtain lab that includes CBC in 6 weeks. 3. Follow-up with Orthopedic clinic for cortisone injection.  4. Discussion about  medication for depression if needed. 5.  Contact office for questions or concerns

## 2013-12-12 ENCOUNTER — Telehealth: Payer: Self-pay | Admitting: Family

## 2013-12-12 ENCOUNTER — Encounter: Payer: Self-pay | Admitting: Family

## 2013-12-12 NOTE — Telephone Encounter (Signed)
Patient Information:  Caller Name: Sherisa  Phone: [phone]  Patient: Kim, Macias  Gender: Female  DOB: Jan 28, 1966  Age: 48 Years  PCP: Roxy [facility])  Pregnant: No  Office Follow Up:  Does the office need to follow up with this patient?: Yes  Instructions For The Office: Patient requests antiemetic.  She has nausea, no vomiting since she had her knees injected on 12/09/13.  She has stopped her pain meds and has mild diarrhea, but reports that the nausea is the worst symptom.  Please follow up with her regarding her request.   Symptoms  Reason For Call & Symptoms: Diarrhea onset 12/10/13.  She also complains of nausea which causes her not to breathe well at night. She relates this to stopping pain meds on 12/09/13 after she received injections in her knees.  She states the only medications she is currently taking are  iron pills, Levothyroxine, Prilosec OTC, Losartan.  She reports Nausea as the worst symptom.  Diarrhea estimated 2 episodes per day.  Emergent symptoms ruled out.  Home care per Vomiting guideline due to Vomiting with Diarrhea. Caller states, "I can't take this nausea. It's just too much".  Reviewed Health History In EMR: Yes  Reviewed Medications In EMR: Yes  Reviewed Allergies In EMR: Yes  Reviewed Surgeries / Procedures: Yes  Date of Onset of Symptoms: 12/10/2013 OB / GYN:  LMP: 11/25/2013  Guideline(s) Used:  Vomiting  Disposition Per Guideline:   Home Care  Reason For Disposition Reached:   Vomiting with diarrhea  Advice Given:  Reassurance:  Adults with vomiting need to stay hydrated. This is the most important thing. If you don't drink and replace lost fluids, you may get dehydrated.  Clear Liquids:  Sip water or a rehydration drink (e.g., Gatorade or Powerade).  Other options: 1/2 strength flat lemon-lime soda or ginger ale.  After 4 hours without vomiting, increase the amount.  Avoid Nonprescription Medicines:  Stop  all nonprescription medicines for 24 hours (Reason: they may make vomiting worse).  Call if vomiting a prescription medicine.  Call Back If:  Signs of dehydration occur  You become worse.  RN Overrode Recommendation:  Document Patient  Advised to see provider due to Nausea/Vomiting as ored may be used only if appointments are full.  Caller requests note to provider to request Rx for nausea that started when she discontinued her Rx pain meds.

## 2013-12-12 NOTE — Telephone Encounter (Signed)
lmom for pt to sch appt °

## 2013-12-13 MED ORDER — FERROUS SULFATE 325 (65 FE) MG PO TABS: 325.0000 mg | ORAL_TABLET | Freq: Three times a day (TID) | ORAL | Status: DC

## 2013-12-13 NOTE — Telephone Encounter (Signed)
Pt decline appt. Pt would like a refill on iron pills 325 mg call into [address]

## 2013-12-13 NOTE — Telephone Encounter (Signed)
Refill sent in

## 2013-12-14 ENCOUNTER — Emergency Department (HOSPITAL_COMMUNITY)
Admission: EM | Admit: 2013-12-14 | Discharge: 2013-12-14 | Disposition: A | Discharge status: Home / Self Care | Payer: Medicare Other | Attending: Emergency Medicine | Admitting: Emergency Medicine

## 2013-12-14 ENCOUNTER — Emergency Department (HOSPITAL_COMMUNITY): Payer: Medicare Other

## 2013-12-14 ENCOUNTER — Encounter (HOSPITAL_COMMUNITY): Payer: Self-pay | Admitting: Emergency Medicine

## 2013-12-14 DIAGNOSIS — D869 Sarcoidosis, unspecified: Secondary | ICD-10-CM | POA: Insufficient documentation

## 2013-12-14 DIAGNOSIS — G4730 Sleep apnea, unspecified: Secondary | ICD-10-CM | POA: Insufficient documentation

## 2013-12-14 DIAGNOSIS — R0789 Other chest pain: Secondary | ICD-10-CM | POA: Diagnosis not present

## 2013-12-14 DIAGNOSIS — Z8701 Personal history of pneumonia (recurrent): Secondary | ICD-10-CM | POA: Diagnosis not present

## 2013-12-14 DIAGNOSIS — IMO0002 Reserved for concepts with insufficient information to code with codable children: Secondary | ICD-10-CM | POA: Insufficient documentation

## 2013-12-14 DIAGNOSIS — Z791 Long term (current) use of non-steroidal anti-inflammatories (NSAID): Secondary | ICD-10-CM | POA: Diagnosis not present

## 2013-12-14 DIAGNOSIS — M19029 Primary osteoarthritis, unspecified elbow: Secondary | ICD-10-CM | POA: Diagnosis not present

## 2013-12-14 DIAGNOSIS — R062 Wheezing: Secondary | ICD-10-CM | POA: Insufficient documentation

## 2013-12-14 DIAGNOSIS — R0602 Shortness of breath: Secondary | ICD-10-CM | POA: Diagnosis not present

## 2013-12-14 DIAGNOSIS — R0600 Dyspnea, unspecified: Secondary | ICD-10-CM

## 2013-12-14 DIAGNOSIS — Z79899 Other long term (current) drug therapy: Secondary | ICD-10-CM | POA: Insufficient documentation

## 2013-12-14 DIAGNOSIS — I10 Essential (primary) hypertension: Secondary | ICD-10-CM | POA: Insufficient documentation

## 2013-12-14 DIAGNOSIS — E039 Hypothyroidism, unspecified: Secondary | ICD-10-CM | POA: Insufficient documentation

## 2013-12-14 DIAGNOSIS — J441 Chronic obstructive pulmonary disease with (acute) exacerbation: Secondary | ICD-10-CM | POA: Insufficient documentation

## 2013-12-14 DIAGNOSIS — D649 Anemia, unspecified: Secondary | ICD-10-CM | POA: Diagnosis not present

## 2013-12-14 DIAGNOSIS — M19049 Primary osteoarthritis, unspecified hand: Secondary | ICD-10-CM | POA: Insufficient documentation

## 2013-12-14 DIAGNOSIS — Z8719 Personal history of other diseases of the digestive system: Secondary | ICD-10-CM | POA: Diagnosis not present

## 2013-12-14 DIAGNOSIS — R079 Chest pain, unspecified: Secondary | ICD-10-CM | POA: Diagnosis not present

## 2013-12-14 LAB — BASIC METABOLIC PANEL
BUN: 14 mg/dL (ref 6–23)
CALCIUM: 9.4 mg/dL (ref 8.4–10.5)
CO2: 26 mEq/L (ref 19–32)
CREATININE: 0.56 mg/dL (ref 0.50–1.10)
Chloride: 99 mEq/L (ref 96–112)
GFR calc Af Amer: >90 mL/min (ref >90)
GLUCOSE: 101 mg/dL — AB (ref 70–99)
Potassium: 4 mEq/L (ref 3.7–5.3)
Sodium: 140 mEq/L (ref 137–147)

## 2013-12-14 LAB — CBC
HCT: 32.2 % — ABNORMAL LOW (ref 36.0–46.0)
Hemoglobin: 9 g/dL — ABNORMAL LOW (ref 12.0–15.0)
MCH: 16.9 pg — AB (ref 26.0–34.0)
MCHC: 28 g/dL — AB (ref 30.0–36.0)
MCV: 60.5 fL — ABNORMAL LOW (ref 78.0–100.0)
PLATELETS: 258 10*3/uL (ref 150–400)
RBC: 5.32 MIL/uL — ABNORMAL HIGH (ref 3.87–5.11)
RDW: 24 % — ABNORMAL HIGH (ref 11.5–15.5)
WBC: 8.8 10*3/uL (ref 4.0–10.5)

## 2013-12-14 LAB — POCT I-STAT TROPONIN I: TROPONIN I, POC: 0 ng/mL (ref 0.00–0.08)

## 2013-12-14 LAB — PRO B NATRIURETIC PEPTIDE: Pro B Natriuretic peptide (BNP): 172.1 pg/mL — ABNORMAL HIGH (ref 0–125)

## 2013-12-14 MED ORDER — METHYLPREDNISOLONE SODIUM SUCC 125 MG IJ SOLR
125.0000 mg | Freq: Once | INTRAMUSCULAR | Status: AC
  Administered 2013-12-14: 125 mg via INTRAVENOUS
  Filled 2013-12-14: qty 2

## 2013-12-14 MED ORDER — ALBUTEROL SULFATE HFA 108 (90 BASE) MCG/ACT IN AERS: 1.0000 | INHALATION_SPRAY | Freq: Four times a day (QID) | RESPIRATORY_TRACT | Status: DC | PRN

## 2013-12-14 MED ORDER — PREDNISONE 10 MG PO TABS: 20.0000 mg | ORAL_TABLET | Freq: Every day | ORAL | Status: DC

## 2013-12-14 MED ORDER — ALBUTEROL SULFATE (2.5 MG/3ML) 0.083% IN NEBU: 2.5000 mg | INHALATION_SOLUTION | RESPIRATORY_TRACT | Status: DC | PRN

## 2013-12-14 NOTE — Discharge Instructions (Signed)
Call cardiologist, for followup appointment. Chest x-ray suggests, pulmonary hypertension. Outpatient echocardiogram is recommended for followup. Take the prednisone as prescribed.  Use albuterol as needed.  Sarcoidosis, Schaumann's Disease, Sarcoid of [place] Sarcoidosis appears briefly and heals naturally in 51 to 70 percent of cases, often without the patient knowing or doing anything about it. 20 to 30 percent of patients with sarcoidosis are left with some permanent lung damage. In 10 to 15 percent of the patients, sarcoidosis can become chronic (long lasting). When either the granulomas or fibrosis seriously affect the function of a vital organ (lungs, heart, nervous system, liver, or kidneys), sarcoidosis can be fatal. This occurs 5 to 10 percent of the time. No one can predict how sarcoidosis will progress in an individual patient. The symptoms the patient experiences, the caregiver's findings, and the patient's race can give some clues. Sarcoidosis was once considered a rare disease. We now know that it is a common chronic illness that appears all over the world. It is the most common of the fibrotic (scarring) lung disorders. Anyone can get sarcoidosis. It occurs in all races and in both sexes. The risk is greater if you are a young black adult, especially a black woman, or are of [place], [place], [place], or [place] origin. In sarcoidosis, small lumps (also called nodules or granulomas) develop in multiple organs of the body. These granulomas are small collections of inflamed cells. They commonly appear in the lungs. This is the most common organ affected. They also occur in the lymph nodes (your glands), skin, liver, and eyes. The granulomas vary in the amount of disease they produce from very little with no problems (symptoms) to causing severe illness. The cause of sarcoidosis is not known. It may be due to an abnormal immune reaction in the body. Most people will recover. A few people  will develop long lasting conditions that may get worse. Women are affected more often than men. The majority of those affected are under 67 years of age. Because we do not know the cause, we do not have ways to prevent it. SYMPTOMS   Fever.  Loss of appetite.  Night sweats.  Joint pain.  Aching muscles Symptoms vary because the disease affects different parts of the body in different people. Most people who see their caregiver with sarcoidosis have lung problems. The first signs are usually a dry cough and shortness of breath. There may also be wheezing, chest pain, or a cough that brings up bloody mucus. In severe cases, lung function may become so poor that the person cannot perform even the simple routine tasks of daily life. Other symptoms of sarcoidosis are less common than lung symptoms. They can include:  Skin symptoms. Sarcoidosis can appear as a collection of tender, red bumps called erythema nodosum. These bumps usually occur on the face, shins, and arms. They can also occur as a scaly, purplish discoloration on the nose, cheeks, and ears. This is called lupus pernio. Less often, sarcoidosis causes cysts, pimples, or disfiguring over growths of skin. In many cases, the disfiguring over growths develop in areas of scars or tattoos.  Eye symptoms. These include redness, eye pain, and sensitivity to light.  Heart symptoms. These include irregular heartbeat and heart failure.  Other symptoms. A person may have paralyzed facial muscles, seizures, psychiatric symptoms, swollen salivary glands, or bone pain. DIAGNOSIS  Even when there are no symptoms, your caregiver can sometimes pick up signs of sarcoidosis during a routine examination, usually through a  chest x-ray or when checking other complaints. The patient's age and race or ethnic group can raise an additional red flag that a sign or symptom could be related to sarcoidosis.   Enlargement of the salivary or tear glands and cysts  in bone tissue may also be caused by sarcoidosis.  You may have had a biopsy done that shows signs of sarcoidosis. A biopsy is a small tissue sample that is removed for laboratory testing. This tissue sample can be taken from your lung, skin, lip, or another inflamed or abnormal area of the body.  You may have had an abnormal chest X-ray. Although you appear healthy, a chest X-ray ordered for other reasons may turn up abnormalities that suggest sarcoidosis.  Other tests may be needed. These tests may be done to rule out other illnesses or to determine the amount of organ damage caused by sarcoidosis. Some of the most common tests are:  Blood levels of calcium or angiotensin-converting enzyme may be high in people with sarcoidosis.  Blood tests to evaluate how well your liver is functioning.  Lung function tests to measure how well you are breathing.  A complete eye examination. TREATMENT  If sarcoidosis does not cause any problems, treatment may not be necessary. Your caregiver may decide to simply monitor your condition. As part of this monitoring process, you may have frequent office visits, follow-up chest X-rays, and tests of your lung function.If you have signs of moderate or severe lung disease, your doctor may recommend:  A corticosteroid drug, such as prednisone (sold under several brand names).  Corticosteroids also are used to treat sarcoidosis of the eyes, joints, skin, nerves, or heart.  Corticosteroid eye drops may be used for the eyes.  Over-the-counter medications like nonsteroidal anti-inflammatory drugs (NSAID) often are used to treat joint pain first before corticosteroids, which tend to have more side effects.  If corticosteroids are not effective or cause serious side effects, other drugs that alter or suppress the immune system may be used.  In rare cases, when sarcoidosis causes life-threatening lung disease, a lung transplant may be necessary. However, there is some  risk that the new lungs also will be attacked by sarcoidosis. SEEK IMMEDIATE MEDICAL CARE IF:   You suffer from shortness of breath or a lingering cough.  You develop new problems that may be related to the disease. Remember this disease can affect almost all organs of the body and cause many different problems. Document Released: 08/17/2004 Document Revised: 01/09/2012 Document Reviewed: 01/25/2006 [facility] Patient Information 2014 [place].

## 2013-12-14 NOTE — ED Notes (Signed)
MD James at bedside.  

## 2013-12-14 NOTE — ED Notes (Signed)
EKG given to EDP, Ghim,MD. For review.

## 2013-12-14 NOTE — ED Notes (Signed)
Patient states she has been short of breath  And tightness with activity for three days.

## 2013-12-14 NOTE — ED Provider Notes (Addendum)
CSN: [id number]     Arrival date & time 12/14/13  [id number] History   First MD Initiated Contact with Patient 12/14/13 [phone]     Chief Complaint  Patient presents with  . Shortness of Breath  . Chest Pain      HPI  Patient presents with approximately one week of dyspnea on exertion.  No pain.  States her chest feels tight and wheezing.  She gets better with rest.  Does not have exertional pain.  Presents with having rest symptoms or rest pain, previous history of morbid obesity.  History of pulmonary sarcoidosis.  No history of, asthma.  No coronary disease.  Does wear oxygen at home at 2 L with a previous history of "hypoventilation syndrome.".  Past Medical History  Diagnosis Date  . Hypertension   . Hypothyroidism   . Anemia   . Morbid obesity   . Gallstones   . Seasonal allergies   . Chronic hyperventilation syndrome     w/ obesity tx with albuterol inhaler and oxygen 2L  . Shortness of breath   . Sleep apnea     uses CPAP machine   . GERD (gastroesophageal reflux disease)     diet controlled - no meds  . H/O hiatal hernia   . Arthritis     hands, shoulders, no meds  . Pneumonia     hoispitalized in 08/2011  . COPD (chronic obstructive pulmonary disease)     uses oxygen 2 L   Past Surgical History  Procedure Laterality Date  . Cholecystectomy  2000  . Cesarean section  1994    x 1  . I and d of abcess  05/2011  . Hysteroscopy w/d&c  12/27/2011    Procedure: DILATATION AND CURETTAGE /HYSTEROSCOPY;  Surgeon: Maeola Sarah. Landry Mellow, MD;  Location: [facility];  Service: Gynecology;;  . Lung biopsy    . Uterine abletion     Family History  Problem Relation Age of Onset  . Diabetes Father   . Diabetes Brother   . Deep vein thrombosis Mother   . Aneurysm Sister     d/o brain aneurysm   History  Substance Use Topics  . Smoking status: Never Smoker   . Smokeless tobacco: Never Used  . Alcohol Use: Yes     Comment: occasionally   OB History   Grav Para Term Preterm Abortions TAB  SAB Ect Mult Living                 Review of Systems  Constitutional: Negative for fever, chills, diaphoresis, appetite change and fatigue.       Weight is 464 pounds.  Comparison of November was 470 pounds  HENT: Negative for mouth sores, sore throat and trouble swallowing.   Eyes: Negative for visual disturbance.  Respiratory: Negative for cough, chest tightness, shortness of breath and wheezing.        Mild prolongation of bronchospasm with cough.  No marked abnormalities or focal changes at rest.  Cardiovascular: Negative for chest pain.       No chest pain  Gastrointestinal: Negative for nausea, vomiting, abdominal pain, diarrhea and abdominal distention.  Endocrine: Negative for polydipsia, polyphagia and polyuria.  Genitourinary: Negative for dysuria, frequency and hematuria.  Musculoskeletal: Negative for gait problem.       Seen for joint pain.  No 10, days, ago.  Injections of "cortisone" he also continues on Monday.  She states her extremities feel better.  Skin: Negative for color change, pallor and rash.  Neurological: Negative for dizziness, syncope, light-headedness and headaches.  Hematological: Does not bruise/bleed easily.  Psychiatric/Behavioral: Negative for behavioral problems and confusion.      Allergies  Review of patient's allergies indicates no known allergies.  Home Medications   Current Outpatient Rx  Name  Route  Sig  Dispense  Refill  . acetaminophen (TYLENOL) 325 MG tablet   Oral   Take 650 mg by mouth every 6 (six) hours as needed for mild pain.         Marland Kitchen acetaminophen-codeine (TYLENOL #3) 300-30 MG per tablet   Oral   Take 1 tablet by mouth every 8 (eight) hours as needed for moderate pain.         . ferrous sulfate 325 (65 FE) MG tablet   Oral   Take 1 tablet (325 mg total) by mouth 3 (three) times daily with meals.   90 tablet   0   . HYDROcodone-acetaminophen (NORCO/VICODIN) 5-325 MG per tablet   Oral   Take 1 tablet by mouth 3  (three) times daily as needed.   30 tablet   0   . levothyroxine (SYNTHROID, LEVOTHROID) 100 MCG tablet   Oral   Take 1 tablet (100 mcg total) by mouth daily at 6 (six) AM.   30 tablet   4   . losartan-hydrochlorothiazide (HYZAAR) 50-12.5 MG per tablet   Oral   Take 1 tablet by mouth every morning.   30 tablet   4   . meloxicam (MOBIC) 7.5 MG tablet   Oral   Take 7.5 mg by mouth 3 (three) times daily.         . methocarbamol (ROBAXIN) 500 MG tablet   Oral   Take 500 mg by mouth every 6 (six) hours as needed for muscle spasms.         . naproxen sodium (ANAPROX) 220 MG tablet   Oral   Take 440 mg by mouth 2 (two) times daily as needed (pain).         . NON FORMULARY      2 liter of oxygen         . oxyCODONE-acetaminophen (PERCOCET) 5-325 MG per tablet   Oral   Take 1 tablet by mouth every 6 (six) hours as needed for moderate pain.   10 tablet   0   . triamcinolone (KENALOG) 0.1 % ointment   Topical   Apply 1 application topically as needed. Applies prior to sun exposure         . triamcinolone acetonide (KENALOG) 40 MG/ML injection   Intramuscular   Inject 40 mg into the muscle every 3 (three) months.         Marland Kitchen albuterol (PROVENTIL HFA;VENTOLIN HFA) 108 (90 BASE) MCG/ACT inhaler   Inhalation   Inhale 1-2 puffs into the lungs every 6 (six) hours as needed for wheezing.   1 Inhaler   0   . albuterol (VENTOLIN HFA) 108 (90 BASE) MCG/ACT inhaler   Inhalation   Inhale 2 puffs into the lungs every 6 (six) hours as needed. For shortness of breath         . predniSONE (DELTASONE) 10 MG tablet   Oral   Take 2 tablets (20 mg total) by mouth daily.   10 tablet   0    BP 149/64  Pulse 69  Temp(Src) 98.8 F (37.1 C) (Oral)  Resp 21  SpO2 98%  LMP 11/27/2013 Physical Exam  Constitutional: She is oriented to person, place,  and time. She appears well-developed and well-nourished. No distress.  HENT:  Head: Normocephalic.  Eyes: Conjunctivae are  normal. Pupils are equal, round, and reactive to light. No scleral icterus.  Neck: Normal range of motion. Neck supple. No thyromegaly present.  Cardiovascular: Normal rate and regular rhythm.  Exam reveals no gallop and no friction rub.   No murmur heard. Pulmonary/Chest: Effort normal. No respiratory distress. She has wheezes. She has no rales.  Abdominal: Soft. Bowel sounds are normal. She exhibits no distension. There is no tenderness. There is no rebound.  Musculoskeletal: Normal range of motion.  Neurological: She is alert and oriented to person, place, and time.  Skin: Skin is warm and dry. No rash noted.  Psychiatric: She has a normal mood and affect. Her behavior is normal.    ED Course  Procedures (including critical care time) Labs Review Labs Reviewed  PRO B NATRIURETIC PEPTIDE - Abnormal; Notable for the following:    Pro B Natriuretic peptide (BNP) 172.1 (*)    All other components within normal limits  CBC - Abnormal; Notable for the following:    RBC 5.32 (*)    Hemoglobin 9.0 (*)    HCT 32.2 (*)    MCV 60.5 (*)    MCH 16.9 (*)    MCHC 28.0 (*)    RDW 24.0 (*)    All other components within normal limits  BASIC METABOLIC PANEL - Abnormal; Notable for the following:    Glucose, Bld 101 (*)    All other components within normal limits  POCT I-STAT TROPONIN I   Imaging Review Dg Chest 2 View  12/14/2013   CLINICAL DATA:  Chest pain.  Shortness of breath.  EXAM: CHEST  2 VIEW  COMPARISON:  Chest radiograph 12/04/2013  FINDINGS: Stable enlarged cardiac and mediastinal contours. No consolidative pulmonary opacities. Pulmonary venous hypertension. No definite pleural effusion or pneumothorax.  IMPRESSION: Cardiomegaly with pulmonary venous hypertension.   Electronically Signed   By: Lovey Newcomer M.D.   On: 12/14/2013 09:01    EKG Interpretation    Date/Time:  Saturday December 14 2013 07:42:59 EST Ventricular Rate:  93 PR Interval:  171 QRS Duration: 87 QT  Interval:  373 QTC Calculation: 464 R Axis:   87 Text Interpretation:  Sinus rhythm Confirmed by Jeneen Rinks  MD, [place] ([id number]) on 12/14/2013 8:14:04 AM Also confirmed by Jeneen Rinks  MD, [place] ([id number])  on 12/14/2013 8:20:48 AM            MDM   Final diagnoses:  Dyspnea  Sarcoidosis    Plan: Dilators, IV steroids.  This may be related to her pulmonary sarcoid.  Rule out pneumonia.  EKG shows no acute process or troponin is normal.  This is somewhat acute coronary syndrome.  Risk of this would be her obesity, and hypertension.  Will get serial enzymes, and recheck after her bronchodilators.  11:11:  Patient is asymptomatic  at rest.  Pulse ox 90% on room air per her rate 79.  Chest x-ray shows mild cardiomegaly.  Suggestion of pulmonary venous hypertension.  Limited bedside echocardiogram performed.  The muscle shows no sign of pleural effusion.  Special: Is with exacerbation of lung disease.  With exacerbation of sarcoidosis.  Her Wellbutrin to bronchodilators and steroids.  Give her referral to cardiology for echocardiogram, considering the suggestion of pulmonary hypertension.  Also, with her history of sarcoid.  Which is appropriate for outpatient treatment and followup at this time.    Tanna Furry, MD 12/14/13  Welch, MD 01/02/14 [id number]  Tanna Furry, MD 01/08/14 [phone]

## 2013-12-16 ENCOUNTER — Telehealth: Payer: Self-pay | Admitting: Family

## 2013-12-16 ENCOUNTER — Telehealth: Payer: Self-pay | Admitting: Pulmonary Disease

## 2013-12-16 DIAGNOSIS — R0602 Shortness of breath: Secondary | ICD-10-CM

## 2013-12-16 NOTE — Telephone Encounter (Signed)
Called and spoke with pt. Made her aware to follow ED recs. Nothing further needed

## 2013-12-16 NOTE — Telephone Encounter (Signed)
Pt was seen at the er [facility]) on 12/14/13 for flare-up, and suggested that you been seen by a cardiologist , pt is requesting a referral to a cardiologist.

## 2013-12-30 ENCOUNTER — Ambulatory Visit (INDEPENDENT_AMBULATORY_CARE_PROVIDER_SITE_OTHER): Payer: Medicare Other | Admitting: Internal Medicine

## 2013-12-30 ENCOUNTER — Encounter: Payer: Self-pay | Admitting: Internal Medicine

## 2013-12-30 VITALS — BP 142/74 | HR 70 | Ht 66.0 in | Wt >= 6400 oz

## 2013-12-30 DIAGNOSIS — I2789 Other specified pulmonary heart diseases: Secondary | ICD-10-CM

## 2013-12-30 DIAGNOSIS — I272 Pulmonary hypertension, unspecified: Secondary | ICD-10-CM

## 2013-12-30 NOTE — Patient Instructions (Signed)
Your physician has requested that you have an echocardiogram. Echocardiography is a painless test that uses sound waves to create images of your heart. It provides your doctor with information about the size and shape of your heart and how well your heart's chambers and valves are working. This procedure takes approximately one hour. There are no restrictions for this procedure.   

## 2013-12-31 NOTE — Progress Notes (Signed)
HPI Patient is a 48 yo with no known cardiac problems.  She recently was seen in [facility] ER for SOB/dyspnea on exertion. She denies CP   She has a history of OSA (has problems wearing mask) and pulmonary sarcoid.  She denies dizziness, no syncope No Known Allergies  Current Outpatient Prescriptions  Medication Sig Dispense Refill  . acetaminophen (TYLENOL) 325 MG tablet Take 650 mg by mouth every 6 (six) hours as needed for mild pain.      Marland Kitchen acetaminophen-codeine (TYLENOL #3) 300-30 MG per tablet Take 1 tablet by mouth every 8 (eight) hours as needed for moderate pain.      Marland Kitchen albuterol (PROVENTIL HFA;VENTOLIN HFA) 108 (90 BASE) MCG/ACT inhaler Inhale 1-2 puffs into the lungs every 6 (six) hours as needed for wheezing.  1 Inhaler  0  . ferrous sulfate 325 (65 FE) MG tablet Take 1 tablet (325 mg total) by mouth 3 (three) times daily with meals.  90 tablet  0  . levothyroxine (SYNTHROID, LEVOTHROID) 100 MCG tablet Take 1 tablet (100 mcg total) by mouth daily at 6 (six) AM.  30 tablet  4  . losartan-hydrochlorothiazide (HYZAAR) 50-12.5 MG per tablet Take 1 tablet by mouth every morning.  30 tablet  4  . meloxicam (MOBIC) 7.5 MG tablet Take 7.5 mg by mouth 3 (three) times daily.      . methocarbamol (ROBAXIN) 500 MG tablet Take 500 mg by mouth every 6 (six) hours as needed for muscle spasms.      . naproxen sodium (ANAPROX) 220 MG tablet Take 440 mg by mouth 2 (two) times daily as needed (pain).      . NON FORMULARY 2 liter of oxygen      . oxyCODONE-acetaminophen (PERCOCET) 5-325 MG per tablet Take 1 tablet by mouth every 6 (six) hours as needed for moderate pain.  10 tablet  0  . triamcinolone (KENALOG) 0.1 % ointment Apply 1 application topically as needed. Applies prior to sun exposure      . triamcinolone acetonide (KENALOG) 40 MG/ML injection Inject 40 mg into the muscle every 3 (three) months.       No current facility-administered medications for this visit.    Past Medical History   Diagnosis Date  . Hypertension   . Hypothyroidism   . Anemia   . Morbid obesity   . Gallstones   . Seasonal allergies   . Chronic hyperventilation syndrome     w/ obesity tx with albuterol inhaler and oxygen 2L  . Shortness of breath   . Sleep apnea     uses CPAP machine   . GERD (gastroesophageal reflux disease)     diet controlled - no meds  . H/O hiatal hernia   . Arthritis     hands, shoulders, no meds  . Pneumonia     hoispitalized in 08/2011  . COPD (chronic obstructive pulmonary disease)     uses oxygen 2 L    Past Surgical History  Procedure Laterality Date  . Cholecystectomy  2000  . Cesarean section  1994    x 1  . I and d of abcess  05/2011  . Hysteroscopy w/d&c  12/27/2011    Procedure: DILATATION AND CURETTAGE /HYSTEROSCOPY;  Surgeon: Maeola Sarah. Landry Mellow, MD;  Location: [facility];  Service: Gynecology;;  . Lung biopsy    . Uterine abletion      Family History  Problem Relation Age of Onset  . Diabetes Father   . Diabetes Brother   .  Deep vein thrombosis Mother   . Aneurysm Sister     d/o brain aneurysm    History   Social History  . Marital Status: Single    Spouse Name: N/A    Number of Children: N/A  . Years of Education: N/A   Occupational History  . unemployeed    Social History Main Topics  . Smoking status: Never Smoker   . Smokeless tobacco: Never Used  . Alcohol Use: Yes     Comment: occasionally  . Drug Use: No  . Sexual Activity: No   Other Topics Concern  . Not on file   Social History Narrative  . No narrative on file    Review of Systems:  All systems reviewed.  They are negative to the above problem except as previously stated.  Vital Signs: BP 142/74  Pulse 70  Ht 5\' 6"  (1.676 m)  Wt 449 lb (203.665 kg)  BMI 72.51 kg/m2  LMP 11/27/2013  Physical Exam Patient is a morbidly obese 48 yo in NAD HEENT:  Normocephalic, atraumatic. EOMI, PERRLA.  Neck: JVP is normal.  No bruits.  Lungs: clear to auscultation. No rales no  wheezes.  Heart: Regular rate and rhythm. Normal S1, S2. No S3.   No significant murmurs. PMI not displaced.  Abdomen:  Supple, nontender. Normal bowel sounds. No masses. No hepatomegaly.  Extremities:   Good distal pulses throughout. No lower extremity edema.  Musculoskeletal :moving all extremities.  Neuro:   alert and oriented x3.  CN II-XII grossly intact.  EKG  SR 93  (12/14/13)  Assessment and Plan:  Dyspnea.  Patient is morbidly obese and has some pulm problems  I would recomm an echo to evaluate R and L heart function. She shold have f/u for CPAP  Counselled on wt loss    F/U prn

## 2014-01-06 ENCOUNTER — Other Ambulatory Visit: Payer: Medicare Other

## 2014-01-15 ENCOUNTER — Ambulatory Visit (HOSPITAL_COMMUNITY): Payer: Medicare Other | Attending: Cardiovascular Disease | Admitting: Radiology

## 2014-01-15 DIAGNOSIS — I270 Primary pulmonary hypertension: Secondary | ICD-10-CM | POA: Diagnosis not present

## 2014-01-15 DIAGNOSIS — R0602 Shortness of breath: Secondary | ICD-10-CM | POA: Insufficient documentation

## 2014-01-15 DIAGNOSIS — I10 Essential (primary) hypertension: Secondary | ICD-10-CM | POA: Diagnosis not present

## 2014-01-15 DIAGNOSIS — Z6841 Body Mass Index (BMI) 40.0 and over, adult: Secondary | ICD-10-CM | POA: Diagnosis not present

## 2014-01-15 DIAGNOSIS — D869 Sarcoidosis, unspecified: Secondary | ICD-10-CM | POA: Insufficient documentation

## 2014-01-15 DIAGNOSIS — I272 Pulmonary hypertension, unspecified: Secondary | ICD-10-CM

## 2014-01-15 DIAGNOSIS — J4489 Other specified chronic obstructive pulmonary disease: Secondary | ICD-10-CM | POA: Insufficient documentation

## 2014-01-15 DIAGNOSIS — J449 Chronic obstructive pulmonary disease, unspecified: Secondary | ICD-10-CM | POA: Insufficient documentation

## 2014-01-15 DIAGNOSIS — G4733 Obstructive sleep apnea (adult) (pediatric): Secondary | ICD-10-CM | POA: Insufficient documentation

## 2014-01-15 NOTE — Progress Notes (Signed)
Echocardiogram performed.  

## 2014-01-20 ENCOUNTER — Telehealth: Payer: Self-pay | Admitting: Pulmonary Disease

## 2014-01-20 ENCOUNTER — Encounter: Payer: Self-pay | Admitting: Pulmonary Disease

## 2014-01-20 ENCOUNTER — Ambulatory Visit (INDEPENDENT_AMBULATORY_CARE_PROVIDER_SITE_OTHER): Payer: Medicare Other | Admitting: Pulmonary Disease

## 2014-01-20 VITALS — BP 144/82 | HR 107 | Temp 98.6°F | Ht 66.0 in | Wt >= 6400 oz

## 2014-01-20 DIAGNOSIS — R918 Other nonspecific abnormal finding of lung field: Secondary | ICD-10-CM | POA: Diagnosis not present

## 2014-01-20 DIAGNOSIS — G4733 Obstructive sleep apnea (adult) (pediatric): Secondary | ICD-10-CM | POA: Diagnosis not present

## 2014-01-20 MED ORDER — PREDNISONE 10 MG PO TABS: ORAL_TABLET | ORAL | Status: DC

## 2014-01-20 NOTE — Telephone Encounter (Signed)
Made in error

## 2014-01-20 NOTE — Progress Notes (Signed)
   Subjective:    Patient ID: [name], female    DOB: 1966/04/30, 48 y.o.   MRN: [id number]  HPI  [name]   48 yo morbidly obese woman with BL infiltrates & hilar and mediastinal lymphadenopathy , presumed sarcoid First seen in hospital 01/26/11 for pulmonary consult for acute dyspnea and hypoxia found to have OHS and OSA with recs for continuous O2 ( 2 L/m at rest & 4L/m on walking)and Nocturnal CPAP  She underwent extensive work up with echo showing mild LVH , EF 55-60% . VQ scan intermed prob , doppler neg. Subsequent CT chest was neg for PE.   CPAP titration 5/12 (wt 479 lbs) showed CPAP 7 cm to stop snoring, No desaturation ON O2 2 L/min   09/2011  - Hosp adm for hemoptysis & BL infiltrates on CT .  p-anca pos 1.80, gbm neg, ANA neg, ESR 42  Labs c/w fe def anemia, UA pos blood (was having periods) >> rpt UA no RBCs Underwent endometrial ablation for menorrhagia     08/2012  admitted  for atypical chest pain, and shortness of breath.  CT chest was negative for PE, notable, hilar and mediastinal lymphadenopathy consistent with worsening sarcoidosis and cardiomegaly C-ANCA 1:40, ANA 1:320 , homogenous ACE level  21   10/02/2012 TBBX >> no granulomas, BAL neg afb, fungal   12/2012 -Was seen by Dr. [name] at [place]. Records reveiwed w/ repeat labs showing neg pANCA and cANCA ,ESR 48 and neg ANA , low ACE level   01/20/2014  Was taken off O2 w/ CPAP. ONO showed no desats on CPAP .  Last cxr 12/14/13 cardiomegaly with pulm htn   Chief Complaint  Patient presents with  . Follow-up    Pt stopped wearing CPAP x 1 1/2 weeks ago. had rash on face and the mask would couse it to burn. Uses 2 liters O2 prn.    C/o new brown spots on her thighs C/o joint pans, ortho gives her shots in her knees CRX 2/14 /15 Cardiomegaly with pulmonary venous hypertension.     Review of Systems neg for any significant sore throat, dysphagia, itching, sneezing, nasal  congestion or excess/ purulent secretions, fever, chills, sweats, unintended wt loss, pleuritic or exertional cp, hempoptysis, orthopnea pnd or change in chronic leg swelling. Also denies presyncope, palpitations, heartburn, abdominal pain, nausea, vomiting, diarrhea or change in bowel or urinary habits, dysuria,hematuria, rash, arthralgias, visual complaints, headache, numbness weakness or ataxia.     Objective:   Physical Exam  Gen. Pleasant, obese, in no distress, normal affect ENT - no lesions, no post nasal drip, class 2-3 airway Neck: No JVD, no thyromegaly, no carotid bruits Lungs: no use of accessory muscles, no dullness to percussion, decreased without rales or rhonchi  Cardiovascular: Rhythm regular, heart sounds  normal, no murmurs or gallops, no peripheral edema Abdomen: soft and non-tender, no hepatosplenomegaly, BS normal. Musculoskeletal: No deformities, no cyanosis or clubbing Neuro:  alert, non focal, no tremors       Assessment & Plan:

## 2014-01-20 NOTE — Patient Instructions (Signed)
You may have sarcoidosis CT chest with contrast for lymph node enlargement Prednisone 10 mg tabs  Take 2 tabs daily with food x 5ds, then 1 tab daily with food x 2 weeks then 1/2 tab daily until you see us again We will consider alternative medication if you have a good response to prednisone If skin lesion shows up on legs , call & we can refer to dermatologist for biopsy 

## 2014-01-20 NOTE — Assessment & Plan Note (Signed)
Get back on CPAP

## 2014-01-20 NOTE — Assessment & Plan Note (Signed)
You may have sarcoidosis CT chest with contrast for lymph node enlargement Prednisone 10 mg tabs  Take 2 tabs daily with food x 5ds, then 1 tab daily with food x 2 weeks then 1/2 tab daily until you see [place] again We will consider alternative medication if you have a good response to prednisone If skin lesion shows up on legs , call & we can refer to dermatologist for biopsy

## 2014-01-21 MED ORDER — PREDNISONE 10 MG PO TABS: ORAL_TABLET | ORAL | Status: DC

## 2014-01-27 ENCOUNTER — Ambulatory Visit (HOSPITAL_COMMUNITY)
Admission: RE | Admit: 2014-01-27 | Discharge: 2014-01-27 | Disposition: A | Discharge status: Home / Self Care | Payer: Medicare Other | Source: Ambulatory Visit | Attending: Pulmonary Disease | Admitting: Pulmonary Disease

## 2014-01-27 ENCOUNTER — Other Ambulatory Visit: Payer: Self-pay | Admitting: Pulmonary Disease

## 2014-01-27 DIAGNOSIS — E041 Nontoxic single thyroid nodule: Secondary | ICD-10-CM | POA: Diagnosis not present

## 2014-01-27 DIAGNOSIS — R918 Other nonspecific abnormal finding of lung field: Secondary | ICD-10-CM

## 2014-01-27 DIAGNOSIS — J984 Other disorders of lung: Secondary | ICD-10-CM | POA: Diagnosis not present

## 2014-01-27 DIAGNOSIS — R599 Enlarged lymph nodes, unspecified: Secondary | ICD-10-CM | POA: Diagnosis not present

## 2014-01-28 ENCOUNTER — Telehealth: Payer: Self-pay | Admitting: Pulmonary Disease

## 2014-01-28 NOTE — Telephone Encounter (Signed)
Pt returning call.[name] ° °

## 2014-01-28 NOTE — Telephone Encounter (Signed)
Notes Recorded by Rigoberto Noel, MD on 01/27/2014 at 4:08 PM Decreased size of LNs Pulmonary infx improved from prior   lmtcb x1

## 2014-01-28 NOTE — Telephone Encounter (Signed)
I spoke with patient about results and she verbalized understanding and had no questions 

## 2014-02-18 ENCOUNTER — Encounter: Payer: Self-pay | Admitting: Adult Health

## 2014-02-18 ENCOUNTER — Ambulatory Visit (INDEPENDENT_AMBULATORY_CARE_PROVIDER_SITE_OTHER): Payer: Medicare Other | Admitting: Adult Health

## 2014-02-18 VITALS — HR 94 | Temp 98.2°F | Ht 66.0 in | Wt >= 6400 oz

## 2014-02-18 DIAGNOSIS — G4733 Obstructive sleep apnea (adult) (pediatric): Secondary | ICD-10-CM

## 2014-02-18 DIAGNOSIS — R918 Other nonspecific abnormal finding of lung field: Secondary | ICD-10-CM

## 2014-02-18 NOTE — Assessment & Plan Note (Signed)
Advised on need to restart CPAP at bedtime. Advised if CPAP mask causes worsening rash or consider changing to nasal pillows Patient is to have referral to dermatology if facial rash worsens

## 2014-02-18 NOTE — Assessment & Plan Note (Signed)
Pulmonary infiltrates, with mediastinal adenopathy suspected sarcoid Improved symptomology on steroids with decreased joint pain, resolved, rash, and decreased adenopathy on CT scan Will continue on low-dose prednisone until return in 6 weeks with Dr. Elsworth Soho to discuss steroid sparing, alternative treatments If facial rash returns/worsens consider referral to derm   Steroid discussion with patient Advised on weight loss , along with diet, and consideration of water, exercises that will be easier on her joints.knees

## 2014-02-18 NOTE — Progress Notes (Signed)
Subjective:    Patient ID: [name], female    DOB: 1966-08-29, 48 y.o.   MRN: [id number]  HPI   [name]   48 yo morbidly obese woman with BL infiltrates & hilar and mediastinal lymphadenopathy , presumed sarcoid First seen in hospital 01/26/11 for pulmonary consult for acute dyspnea and hypoxia found to have OHS and OSA with recs for continuous O2 ( 2 L/m at rest & 4L/m on walking)and Nocturnal CPAP  She underwent extensive work up with echo showing mild LVH , EF 55-60% . VQ scan intermed prob , doppler neg. Subsequent CT chest was neg for PE.   CPAP titration 5/12 (wt 479 lbs) showed CPAP 7 cm to stop snoring, No desaturation ON O2 2 L/min   09/2011  - Hosp adm for hemoptysis & BL infiltrates on CT .  p-anca pos 1.80, gbm neg, ANA neg, ESR 42  Labs c/w fe def anemia, UA pos blood (was having periods) >> rpt UA no RBCs Underwent endometrial ablation for menorrhagia     08/2012  admitted  for atypical chest pain, and shortness of breath.  CT chest was negative for PE, notable, hilar and mediastinal lymphadenopathy consistent with worsening sarcoidosis and cardiomegaly C-ANCA 1:40, ANA 1:320 , homogenous ACE level  21   10/02/2012 TBBX >> no granulomas, BAL neg afb, fungal   12/2012 -Was seen by Dr. [name] at [place]. Records reveiwed w/ repeat labs showing neg pANCA and cANCA ,ESR 48 and neg ANA , low ACE level   01/20/14   Was taken off O2 w/ CPAP. ONO showed no desats on CPAP .  Last cxr 12/14/13 cardiomegaly with pulm htn  C/o new brown spots on her thighs C/o joint pans, ortho gives her shots in her knees CRX 2/14 /15 Cardiomegaly with pulmonary venous hypertension. >pred challenge /to 11m daily    02/18/2014 Follow up  Returns for  4 week follow up .  Still not wearing CPAP, but does wear O2 qhs. Seen last ov with brown spots on legs and facial rash w/ joint aches .  Was started on steroid challenge.  Says she feels so much better. Rash  has cleared on legs and facial rash is much better.  Joint swelling is much better. Currently on 1/2 tab prednisone 161mdaily .  Has not restarted CPAP thought the mask was causing facial rash. Has some hypopigmentation along nose and chin.  Had repeat CT chest >decreased hilar /mediastinal adenopathy-(done 1 week after steroids started )    Review of Systems  neg for any significant sore throat, dysphagia, itching, sneezing, nasal congestion or excess/ purulent secretions, fever, chills, sweats, unintended wt loss, pleuritic or exertional cp, hempoptysis, orthopnea pnd or change in chronic leg swelling. Also denies presyncope, palpitations, heartburn, abdominal pain, nausea, vomiting, diarrhea or change in bowel or urinary habits, dysuria,hematuria, rash, arthralgias, visual complaints, headache, numbness weakness or ataxia.     Objective:   Physical Exam   Gen. Pleasant, obese, in no distress, normal affect ENT - no lesions, no post nasal drip, class 2-3 airway Neck: No JVD, no thyromegaly, no carotid bruits Lungs: no use of accessory muscles, no dullness to percussion, decreased without rales or rhonchi  Cardiovascular: Rhythm regular, heart sounds  normal, no murmurs or gallops, no peripheral edema Abdomen: soft and non-tender, no hepatosplenomegaly, BS normal. Musculoskeletal: No deformities, no cyanosis or clubbing Neuro:  alert, non focal, no tremors  CT  Chest 01/27/14 >Bilateral pulmonary  opacities, multilobular. The patient has  mediastinal adenopathy has improved when compared to the previous  study a residual 1.4 cm right peritracheal lymph node.      Assessment & Plan:

## 2014-02-18 NOTE — Patient Instructions (Signed)
Restart CPAP At bedtime  Stay on Prednisone 10mg  1/2 daily .  Work on weight loss.  If CPAP mask causes rash again, call back and will change to nasal pillows  Also if rash on face worsens will need to go to dermatology  Follow up Dr. Elsworth Soho  In 6 weeks and As needed

## 2014-02-23 ENCOUNTER — Encounter: Payer: Self-pay | Admitting: *Deleted

## 2014-02-27 NOTE — Progress Notes (Signed)
Reviewed & agree with plan  

## 2014-04-03 ENCOUNTER — Other Ambulatory Visit: Payer: Self-pay | Admitting: Pulmonary Disease

## 2014-04-14 ENCOUNTER — Encounter: Payer: Self-pay | Admitting: Adult Health

## 2014-04-14 ENCOUNTER — Ambulatory Visit (INDEPENDENT_AMBULATORY_CARE_PROVIDER_SITE_OTHER): Payer: Medicare Other | Admitting: Adult Health

## 2014-04-14 VITALS — BP 112/66 | HR 85 | Temp 98.7°F | Ht 66.0 in | Wt >= 6400 oz

## 2014-04-14 DIAGNOSIS — R918 Other nonspecific abnormal finding of lung field: Secondary | ICD-10-CM

## 2014-04-14 DIAGNOSIS — G4733 Obstructive sleep apnea (adult) (pediatric): Secondary | ICD-10-CM

## 2014-04-14 MED ORDER — PREDNISONE 5 MG PO TABS: ORAL_TABLET | ORAL | Status: DC

## 2014-04-14 NOTE — Addendum Note (Signed)
Addended by: Parke Poisson E on: 04/14/2014 02:59 PM   Modules accepted: Orders

## 2014-04-14 NOTE — Patient Instructions (Signed)
Take Prednisone 5mg  every other day for 4 weeks and then stop  I will call you regarding repeat CT chest  Continue on CPAP At bedtime  Work on weight loss.  Follow up Dr. Elsworth Soho  In 6 weeks and As needed

## 2014-04-14 NOTE — Progress Notes (Signed)
Subjective:    Patient ID: [name], female    DOB: [birth year], 48 y.o.   MRN: [id number]  HPI [name]   48 yo morbidly obese woman with BL infiltrates & hilar and mediastinal lymphadenopathy , presumed sarcoid First seen in hospital 01/26/11 for pulmonary consult for acute dyspnea and hypoxia found to have OHS and OSA with recs for continuous O2 ( 2 L/m at rest & 4L/m on walking)and Nocturnal CPAP  She underwent extensive work up with echo showing mild LVH , EF 55-60% . VQ scan intermed prob , doppler neg. Subsequent CT chest was neg for PE.   CPAP titration 5/12 (wt 479 lbs) showed CPAP 7 cm to stop snoring, No desaturation ON O2 2 L/min   09/2011  - Hosp adm for hemoptysis & BL infiltrates on CT .  p-anca pos 1.80, gbm neg, ANA neg, ESR 42  Labs c/w fe def anemia, UA pos blood (was having periods) >> rpt UA no RBCs Underwent endometrial ablation for menorrhagia     08/2012  admitted  for atypical chest pain, and shortness of breath.  CT chest was negative for PE, notable, hilar and mediastinal lymphadenopathy consistent with worsening sarcoidosis and cardiomegaly C-ANCA 1:40, ANA 1:320 , homogenous ACE level  21   10/02/2012 TBBX >> no granulomas, BAL neg afb, fungal   12/2012 -Was seen by Dr. [name] at [place]. Records reveiwed w/ repeat labs showing neg pANCA and cANCA ,ESR 48 and neg ANA , low ACE level   01/20/14   Was taken off O2 w/ CPAP. ONO showed no desats on CPAP .  Last cxr 12/14/13 cardiomegaly with pulm htn  C/o new brown spots on her thighs C/o joint pans, ortho gives her shots in her knees CRX 2/14 /15 Cardiomegaly with pulmonary venous hypertension. >pred challenge /to 28m daily    02/18/14 Follow up  Returns for  4 week follow up .  Still not wearing CPAP, but does wear O2 qhs. Seen last ov with brown spots on legs and facial rash w/ joint aches .  Was started on steroid challenge.  Says she feels so much better. Rash has  cleared on legs and facial rash is much better.  Joint swelling is much better. Currently on 1/2 tab prednisone 194mdaily .  Has not restarted CPAP thought the mask was causing facial rash. Has some hypopigmentation along nose and chin.  Had repeat CT chest >decreased hilar /mediastinal adenopathy-(done 1 week after steroids started )  >Pred decreased to 37m81maily   04/14/2014 Follow up Pulmonary Infiltrates/Hilar Adenopathy (?Sarcoid) and OSA  Patient returns for a 6 week followup. Patient reports that she has restarted her CPAP and is wearing most nights for at least 4 hours. Download has been requested. We discussed CPAP compliance, weight loss Reports that she does feel somewhat improved with decreased sleeping, and, fatigue Patient has suspected sarcoidosis with hilar and mediastinal adenopathy and bilateral lung opacities on CT scan She has been started on a steroid challenge, beginning mid March with a slow taper. Over the last several weeks. She is currently on 5 mg of prednisone. Says that she is doing well with no cough. Increased shortness, of breath. She has a waxing rash along the legs, arms, and face. Patient reports previous biopsy of, arm, rash, by dermatology was consistent with sun exposure dermatitis. We discussed the risk of steroid use. She denies any chest pain, orthopnea, PND, increased leg swelling, or hemoptysis.  She says that since starting steroids. Her joint pains, especially in her knees and substantially decreased. Review of Systems  neg for any significant sore throat, dysphagia, itching, sneezing, nasal congestion or excess/ purulent secretions, fever, chills, sweats, unintended wt loss, pleuritic or exertional cp, hempoptysis, orthopnea pnd or change in chronic leg swelling. Also denies presyncope, palpitations, heartburn, abdominal pain, nausea, vomiting, diarrhea or change in bowel or urinary habits, dysuria,hematuria, rash, arthralgias, visual complaints,  headache, numbness weakness or ataxia.     Objective:   Physical Exam   Gen. Pleasant, obese, in no distress, normal affect ENT - no lesions, no post nasal drip, class 2-3 airway Neck: No JVD, no thyromegaly, no carotid bruits Lungs: no use of accessory muscles, no dullness to percussion, decreased without rales or rhonchi  Cardiovascular: Rhythm regular, heart sounds  normal, no murmurs or gallops, tr-1 chronic peripheral edema w/ stasis dermatitis Abdomen: soft and non-tender, no hepatosplenomegaly, BS normal. Musculoskeletal: No deformities, no cyanosis or clubbing Neuro:  alert, non focal, no tremors Skin: no facial rash noted. No rash on legs,  Small excoriated patch along left arm w/ scaly border   CT  Chest 01/27/14 >Bilateral pulmonary opacities, multilobular. The patient has  mediastinal adenopathy has improved when compared to the previous  study a residual 1.4 cm right peritracheal lymph node.      Assessment & Plan:

## 2014-04-14 NOTE — Assessment & Plan Note (Signed)
Bilateral pulmonary infiltrates, with associated hilar and mediastinal adenopathy--suspect that his underlying sarcoid.  Patient would be high risk for lung biopsy. Steroid challenge, with tapering dose over the last 3 months. We'll taper patient off steroids over next few weeks.  Repeat CT Chest to evaluate for decrease in infiltrate/adenopaty on steroids  Follow up Dr. Elsworth Soho  In 6 weeks and As needed

## 2014-04-15 NOTE — Progress Notes (Signed)
Reviewed & agree with plan  

## 2014-04-21 ENCOUNTER — Ambulatory Visit (HOSPITAL_COMMUNITY)
Admission: RE | Admit: 2014-04-21 | Discharge: 2014-04-21 | Disposition: A | Discharge status: Home / Self Care | Payer: Medicare Other | Source: Ambulatory Visit | Attending: Adult Health | Admitting: Adult Health

## 2014-04-21 DIAGNOSIS — R599 Enlarged lymph nodes, unspecified: Secondary | ICD-10-CM | POA: Diagnosis not present

## 2014-04-21 DIAGNOSIS — J984 Other disorders of lung: Secondary | ICD-10-CM | POA: Insufficient documentation

## 2014-04-21 DIAGNOSIS — R918 Other nonspecific abnormal finding of lung field: Secondary | ICD-10-CM | POA: Diagnosis not present

## 2014-04-23 ENCOUNTER — Encounter: Payer: Self-pay | Admitting: Adult Health

## 2014-04-24 NOTE — Progress Notes (Signed)
Quick Note:  LMOM TCB x2. ______ 

## 2014-04-24 NOTE — Progress Notes (Signed)
Quick Note:  Patient returned call. Advised of CT results / recs as stated by TP. Pt verbalized understanding and denied any questions. ______ 

## 2014-05-20 DIAGNOSIS — M1710 Unilateral primary osteoarthritis, unspecified knee: Secondary | ICD-10-CM | POA: Diagnosis not present

## 2014-05-26 ENCOUNTER — Ambulatory Visit: Payer: Medicare Other | Admitting: Pulmonary Disease

## 2014-06-10 ENCOUNTER — Ambulatory Visit (INDEPENDENT_AMBULATORY_CARE_PROVIDER_SITE_OTHER): Payer: Medicare Other | Admitting: Family

## 2014-06-10 ENCOUNTER — Encounter: Payer: Self-pay | Admitting: Family

## 2014-06-10 DIAGNOSIS — R7309 Other abnormal glucose: Secondary | ICD-10-CM | POA: Diagnosis not present

## 2014-06-10 DIAGNOSIS — E039 Hypothyroidism, unspecified: Secondary | ICD-10-CM | POA: Diagnosis not present

## 2014-06-10 DIAGNOSIS — I10 Essential (primary) hypertension: Secondary | ICD-10-CM

## 2014-06-10 DIAGNOSIS — R739 Hyperglycemia, unspecified: Secondary | ICD-10-CM

## 2014-06-10 LAB — HEPATIC FUNCTION PANEL
ALT: 17 U/L (ref 0–35)
AST: 18 U/L (ref 0–37)
Albumin: 3.5 g/dL (ref 3.5–5.2)
Alkaline Phosphatase: 42 U/L (ref 39–117)
BILIRUBIN TOTAL: 0.5 mg/dL (ref 0.2–1.2)
Bilirubin, Direct: 0 mg/dL (ref 0.0–0.3)
Total Protein: 8.3 g/dL (ref 6.0–8.3)

## 2014-06-10 LAB — CBC WITH DIFFERENTIAL/PLATELET
Basophils Absolute: 0 10*3/uL (ref 0.0–0.1)
Basophils Relative: 0.1 % (ref 0.0–3.0)
EOS ABS: 0.1 10*3/uL (ref 0.0–0.7)
Eosinophils Relative: 1.4 % (ref 0.0–5.0)
HCT: 29.4 % — ABNORMAL LOW (ref 36.0–46.0)
Hemoglobin: 8.5 g/dL — ABNORMAL LOW (ref 12.0–15.0)
Lymphocytes Relative: 10.8 % — ABNORMAL LOW (ref 12.0–46.0)
Lymphs Abs: 1 10*3/uL (ref 0.7–4.0)
MCHC: 28.8 g/dL — ABNORMAL LOW (ref 30.0–36.0)
MCV: 57.6 fl — ABNORMAL LOW (ref 78.0–100.0)
MONO ABS: 0.6 10*3/uL (ref 0.1–1.0)
MONOS PCT: 6.2 % (ref 3.0–12.0)
Neutro Abs: 7.7 10*3/uL (ref 1.4–7.7)
Neutrophils Relative %: 81.5 % — ABNORMAL HIGH (ref 43.0–77.0)
PLATELETS: 194 10*3/uL (ref 150.0–400.0)
RBC: 5.11 Mil/uL (ref 3.87–5.11)
RDW: 25.1 % — ABNORMAL HIGH (ref 11.5–15.5)
WBC: 9.5 10*3/uL (ref 4.0–10.5)

## 2014-06-10 LAB — TSH: TSH: 3.44 u[IU]/mL (ref 0.35–4.50)

## 2014-06-10 LAB — BASIC METABOLIC PANEL
BUN: 16 mg/dL (ref 6–23)
CHLORIDE: 100 meq/L (ref 96–112)
CO2: 21 mEq/L (ref 19–32)
CREATININE: 0.7 mg/dL (ref 0.4–1.2)
Calcium: 8.9 mg/dL (ref 8.4–10.5)
GFR: 107.41 mL/min (ref >60.00)
Glucose, Bld: 87 mg/dL (ref 70–99)
Potassium: 3.9 mEq/L (ref 3.5–5.1)
Sodium: 135 mEq/L (ref 135–145)

## 2014-06-10 LAB — HEMOGLOBIN A1C: Hgb A1c MFr Bld: 5.4 % (ref 4.6–6.5)

## 2014-06-10 NOTE — Progress Notes (Signed)
Subjective:    Patient ID: [name], female    DOB: 23-Oct-1966, 48 y.o.   MRN: [id number]  HPI 48 year old AAF, nonsmoker, morbidly obese female,  is in today requesting to have her thyroid checked. At her last office visit she was told to return in 6 weeks due to an abnormal thyroid. She returns 6 months later.   Has concerns left neck pain after awakening 5 days ago that has improved over the last few days. Rates pain 3/10, worse with movement. No injury. Has been taking Mobic without results.   Has c/o right foot pain x 3-4 days. No injury. Pain is no worse with movement.    Review of Systems  Constitutional: Negative.   HENT: Negative.   Respiratory: Negative.   Cardiovascular: Negative.   Gastrointestinal: Negative.   Endocrine: Negative.   Genitourinary: Negative.   Musculoskeletal: Positive for neck pain and neck stiffness.       Right foot pain   Skin: Negative.   Allergic/Immunologic: Negative.   Neurological: Negative.   Psychiatric/Behavioral: Negative.    Past Medical History  Diagnosis Date  . Hypertension   . Hypothyroidism   . Anemia   . Morbid obesity   . Gallstones   . Seasonal allergies   . Chronic hyperventilation syndrome     w/ obesity tx with albuterol inhaler and oxygen 2L  . Shortness of breath   . Sleep apnea     uses CPAP machine   . GERD (gastroesophageal reflux disease)     diet controlled - no meds  . H/O hiatal hernia   . Arthritis     hands, shoulders, no meds  . Pneumonia     hoispitalized in 08/2011  . COPD (chronic obstructive pulmonary disease)     uses oxygen 2 L    History   Social History  . Marital Status: Single    Spouse Name: N/A    Number of Children: N/A  . Years of Education: N/A   Occupational History  . unemployeed    Social History Main Topics  . Smoking status: Never Smoker   . Smokeless tobacco: Never Used  . Alcohol Use: Yes     Comment: occasionally  . Drug Use: No  . Sexual Activity:  No   Other Topics Concern  . Not on file   Social History Narrative  . No narrative on file    Past Surgical History  Procedure Laterality Date  . Cholecystectomy  2000  . Cesarean section  1994    x 1  . I and d of abcess  05/2011  . Hysteroscopy w/d&c  12/27/2011    Procedure: DILATATION AND CURETTAGE /HYSTEROSCOPY;  Surgeon: Maeola Sarah. Landry Mellow, MD;  Location: [facility];  Service: Gynecology;;  . Lung biopsy    . Uterine abletion      Family History  Problem Relation Age of Onset  . Diabetes Father   . Diabetes Brother   . Deep vein thrombosis Mother   . Aneurysm Sister     d/o brain aneurysm    No Known Allergies  Current Outpatient Prescriptions on File Prior to Visit  Medication Sig Dispense Refill  . acetaminophen (TYLENOL) 325 MG tablet Take 650 mg by mouth every 6 (six) hours as needed for mild pain.      Marland Kitchen acetaminophen-codeine (TYLENOL #3) 300-30 MG per tablet Take 1 tablet by mouth every 8 (eight) hours as needed for moderate pain.      Marland Kitchen  albuterol (PROVENTIL HFA;VENTOLIN HFA) 108 (90 BASE) MCG/ACT inhaler Inhale 1-2 puffs into the lungs every 6 (six) hours as needed for wheezing.  1 Inhaler  0  . ferrous sulfate 325 (65 FE) MG tablet Take 1 tablet (325 mg total) by mouth 3 (three) times daily with meals.  90 tablet  0  . HYDROcodone-acetaminophen (NORCO) 10-325 MG per tablet Take 1 tablet by mouth every 6 (six) hours as needed.      Marland Kitchen levothyroxine (SYNTHROID, LEVOTHROID) 100 MCG tablet Take 1 tablet (100 mcg total) by mouth daily at 6 (six) AM.  30 tablet  4  . losartan-hydrochlorothiazide (HYZAAR) 50-12.5 MG per tablet Take 1 tablet by mouth every morning.  30 tablet  4  . meloxicam (MOBIC) 7.5 MG tablet Take 7.5 mg by mouth 3 (three) times daily.      . methocarbamol (ROBAXIN) 500 MG tablet Take 500 mg by mouth every 6 (six) hours as needed for muscle spasms.      . NON FORMULARY 2 liter of oxygen      . predniSONE (DELTASONE) 5 MG tablet Take 1/2 tab daily  30 tablet   0  . triamcinolone (KENALOG) 0.1 % ointment Apply 1 application topically as needed. Applies prior to sun exposure      . triamcinolone acetonide (KENALOG) 40 MG/ML injection Inject 40 mg into the muscle every 3 (three) months. For knee pain       No current facility-administered medications on file prior to visit.    BP 140/82  Pulse 104  Temp(Src) 98.5 F (36.9 C) (Oral)  Wt 456 lb 11.2 oz (207.158 kg)chart    Objective:   Physical Exam  Constitutional: She is oriented to person, place, and time. She appears well-developed and well-nourished.  HENT:  Right Ear: External ear normal.  Left Ear: External ear normal.  Nose: Nose normal.  Mouth/Throat: Oropharynx is clear and moist.  Neck: Normal range of motion. Neck supple.  Cardiovascular: Normal rate, regular rhythm and normal heart sounds.   Pulmonary/Chest: Effort normal and breath sounds normal.  Abdominal: Soft. Bowel sounds are normal.  Musculoskeletal: Normal range of motion.  Neurological: She is alert and oriented to person, place, and time.  Skin: Skin is warm and dry.  Psychiatric: She has a normal mood and affect.          Assessment & Plan:  [name] was seen today for no specified reason.  Diagnoses and associated orders for this visit:  Morbid obesity - Hemoglobin K1S - Basic Metabolic Panel - TSH - CBC with Differential - Hepatic Function Panel  Unspecified essential hypertension - Hemoglobin W1U - Basic Metabolic Panel - TSH - CBC with Differential - Hepatic Function Panel  Unspecified hypothyroidism - Hemoglobin X3A - Basic Metabolic Panel - TSH - CBC with Differential - Hepatic Function Panel  Hyperglycemia - Hemoglobin T5T - Basic Metabolic Panel - TSH - CBC with Differential - Hepatic Function Panel   Call the office with any questions or concerns. Recheck in 4 months for a CPX and sooner as needed.

## 2014-06-10 NOTE — Progress Notes (Signed)
Pre visit review using our clinic review tool, if applicable. No additional management support is needed unless otherwise documented below in the visit note. 

## 2014-06-10 NOTE — Patient Instructions (Signed)

## 2014-06-12 ENCOUNTER — Encounter: Payer: Self-pay | Admitting: Pulmonary Disease

## 2014-06-12 ENCOUNTER — Ambulatory Visit (INDEPENDENT_AMBULATORY_CARE_PROVIDER_SITE_OTHER): Payer: Medicare Other | Admitting: Pulmonary Disease

## 2014-06-12 VITALS — BP 128/74 | HR 102 | Temp 98.2°F | Ht 66.0 in | Wt >= 6400 oz

## 2014-06-12 DIAGNOSIS — R918 Other nonspecific abnormal finding of lung field: Secondary | ICD-10-CM

## 2014-06-12 DIAGNOSIS — D869 Sarcoidosis, unspecified: Secondary | ICD-10-CM | POA: Diagnosis not present

## 2014-06-12 DIAGNOSIS — G4733 Obstructive sleep apnea (adult) (pediatric): Secondary | ICD-10-CM | POA: Diagnosis not present

## 2014-06-12 NOTE — Assessment & Plan Note (Signed)
Prednisone 5 mg diaily x 1 month On Sep 10 th, change to m/w/f dosing Can take hydrocortisone cream 2% for local application for rash We discussed alternatives to prednisone - methotrexate & acth injections -she is not interested at this time

## 2014-06-12 NOTE — Patient Instructions (Signed)
Prednisone 5 mg diaily x 1 month On Sep 10 th, change to m/w/f dosing Can take hydrocortisone cream 2% for local application for rash We discussed alternatives to prednisone - methotrexate

## 2014-06-12 NOTE — Progress Notes (Signed)
Subjective:    Patient ID: [name], female    DOB: [birth year], 48 y.o.   MRN: [id number]  HPI  [name]   48 yo morbidly obese woman with BL infiltrates & hilar and mediastinal lymphadenopathy , presumed sarcoid (although TBBX neg ) with skin involvement  Significant tests/ events:  First seen in hospital 01/26/11 for pulmonary consult for acute dyspnea and hypoxia found to have OHS and OSA with recs for continuous O2 ( 2 L/m at rest & 4L/m on walking)and Nocturnal CPAP  Echo- mild LVH , EF 55-60% . VQ scan intermed prob , doppler neg. CT chest was neg for PE.  CPAP titration 03/2011 (wt 479 lbs) -CPAP 7 cm to stop snoring, No desaturation on O2 2 L/min   09/2011 - Hosp adm for hemoptysis & BL infiltrates on CT,p-anca pos 1.80, gbm neg, ANA neg, ESR 42  Labs c/w fe def anemia, UA pos blood (was having periods) >> rpt UA no RBCs  Underwent endometrial ablation for menorrhagia   08/2012 admitted for atypical chest pain, and shortness of breath.  CT chest was negative for PE, notable, hilar and mediastinal lymphadenopathy consistent with worsening sarcoidosis and cardiomegaly  C-ANCA 1:40, ANA 1:320 , homogenous  ACE level 21   10/02/2012 TBBX >> no granulomas, BAL neg afb, fungal  12/2012 -Was seen by Dr. [name] at [place].  repeat labs>> neg pANCA and cANCA ,ESR 48 and neg ANA , low ACE level    CT chest  12/2013 >decreased hilar /mediastinal adenopathy-(done 1 week after steroids started )  >Pred decreased to $RemoveBefo'5mg'JQEzQpnnBbL$  daily   CT chest 04/24/14 - Scattered pulmonary opacities with ground-glass densities, clustered  reticulonodular densities and interstitial thickening  06/12/2014   Chief Complaint  Patient presents with  . Follow-up    Pt reports she wears CPAP everynight. Still breaks out in a rash but is d/t stopping prednisone. Pt reports breathing is worse also since prednisone was stopped. Also c/o wheezing, chest tx, dry cough-mainly at night,  PND, nasal congestion.    Started on prednisone 12/2013 for  new brown spots on her thighs, facial rash,  joint pains & increased dyspnea infx  Pred tapered to off in mid July, Now rash has returned, also increased resp symptoms as above Does not wear CPAP, but does wear O2 qhs.  She has a waxing rash along the legs, arms, and face. Patient reports previous biopsy of, arm, rash, by dermatology was consistent with sun exposure dermatitis.  We discussed the risk of steroid use.  She denies any chest pain, orthopnea, PND, increased leg swelling, or hemoptysis.  She says that since starting steroids. Her joint pains, especially in her knees and substantially decreased.   Review of Systems neg for any significant sore throat, dysphagia, itching, sneezing, nasal congestion or excess/ purulent secretions, fever, chills, sweats, unintended wt loss, pleuritic or exertional cp, hempoptysis, orthopnea pnd or change in chronic leg swelling. Also denies presyncope, palpitations, heartburn, abdominal pain, nausea, vomiting, diarrhea or change in bowel or urinary habits, dysuria,hematuria, rash, arthralgias, visual complaints, headache, numbness weakness or ataxia.      Objective:   Physical Exam  Gen. Pleasant, obese, in no distress ENT - no lesions, no post nasal drip Neck: No JVD, no thyromegaly, no carotid bruits Lungs: no use of accessory muscles, no dullness to percussion, decreased without rales or rhonchi  Cardiovascular: Rhythm regular, heart sounds  normal, no murmurs or gallops, no peripheral edema Musculoskeletal:  No deformities, no cyanosis or clubbing , no tremors       Assessment & Plan:

## 2014-06-13 NOTE — Assessment & Plan Note (Signed)
Ct noct o2

## 2014-07-04 ENCOUNTER — Ambulatory Visit (INDEPENDENT_AMBULATORY_CARE_PROVIDER_SITE_OTHER): Payer: Medicare Other | Admitting: Pulmonary Disease

## 2014-07-04 ENCOUNTER — Telehealth: Payer: Self-pay | Admitting: Pulmonary Disease

## 2014-07-04 ENCOUNTER — Encounter: Payer: Self-pay | Admitting: Pulmonary Disease

## 2014-07-04 VITALS — BP 140/80 | HR 107 | Temp 98.8°F | Ht 66.0 in | Wt >= 6400 oz

## 2014-07-04 DIAGNOSIS — E662 Morbid (severe) obesity with alveolar hypoventilation: Secondary | ICD-10-CM | POA: Diagnosis not present

## 2014-07-04 DIAGNOSIS — R0602 Shortness of breath: Secondary | ICD-10-CM

## 2014-07-04 MED ORDER — FUROSEMIDE 8 MG/ML PO SOLN: 40.0000 mg | Freq: Every day | ORAL | Status: DC

## 2014-07-04 MED ORDER — CEFDINIR 300 MG PO CAPS: 300.0000 mg | ORAL_CAPSULE | Freq: Two times a day (BID) | ORAL | Status: DC

## 2014-07-04 NOTE — Patient Instructions (Signed)
Start on lasix 40mg  each am until followup with nurse practioner next week Stay on current BP medication Will treat with omnicef 300mg , take 2 each day for 5 days, for possible infection. You must stay on oxygen 24hrs a day, even when away from home followup with our nurse practitioner next week.

## 2014-07-04 NOTE — Progress Notes (Signed)
   Subjective:    Patient ID: [name], female    DOB: 07-28-1966, 48 y.o.   MRN: [id number]  HPI The patient comes in today for an acute sick visit. She is normally seen by Dr. Elsworth Soho for chronic respiratory failure related to obesity hypoventilation syndrome, as well as sarcoidosis. She comes in today with a one-week history of increasing chest congestion, cough with purulent mucus, and significantly increased shortness of breath. She has not had any fever or pleuritic chest pain. She denies any hemoptysis. It is noted from the last visit that she has gained 24 pounds in 3 weeks, and the patient feels that she has taken on a lot of fluid. Unfortunately, she is not wearing her oxygen compliantly when she is away from home.   Review of Systems  Constitutional: Negative for fever and unexpected weight change.  HENT: Negative for congestion, dental problem, ear pain, nosebleeds, postnasal drip, rhinorrhea, sinus pressure, sneezing, sore throat and trouble swallowing.   Eyes: Negative for redness and itching.  Respiratory: Positive for cough, chest tightness and shortness of breath. Negative for wheezing.   Cardiovascular: Positive for chest pain and leg swelling. Negative for palpitations.  Gastrointestinal: Negative for nausea and vomiting.  Genitourinary: Negative for dysuria.  Musculoskeletal: Negative for joint swelling.  Skin: Negative for rash.  Neurological: Negative for headaches.  Hematological: Does not bruise/bleed easily.  Psychiatric/Behavioral: Negative for dysphoric mood. The patient is not nervous/anxious.        Objective:   Physical Exam Massively obese female in no acute distress Nose without purulence or discharge noted Neck without lymphadenopathy or thyromegaly Chest totally clear to auscultation with small lung volumes Cardiac exam with mildly tachycardic rhythm but regular, and no murmurs Lower extremities with massive edema bilaterally with venous  stasis. Alert and oriented, moves all 4 extremities.       Assessment & Plan:

## 2014-07-04 NOTE — Assessment & Plan Note (Addendum)
The patient has worsening shortness of breath over the last one to 2 weeks, but is also having cough with some purulent mucus. It should be noted that she has gained 24 pounds in 3 weeks according to our scales, and I suspect that her symptoms are due to biventricular failure.  She also has a cough with congestion and purulent mucus, and we'll therefore treat her for acute bronchitis as well. She needs to be followed very closely while initiating diuretic therapy, and we'll therefore need to be seen next week for close followup. She will need an appointment with her primary pulmonologist in the near future. I have also stressed to her the importance of wearing her oxygen 24 hours a day. She has not been wearing it whenever she leaves the house, and her sats were very low today without her oxygen.  I really do not think her shortness of breath has anything to do with her sarcoid, and she does not have a history consistent with thromboembolic disease although she is at high risk. I have considered checking a chest x-ray today, but given her massive obesity, it is very unlikely to show [place] anything of value except very small lung volumes.

## 2014-07-04 NOTE — Telephone Encounter (Signed)
Called spoke with pt. appt scheduled to see [facility] this afternoon. Nothing further needed

## 2014-07-11 ENCOUNTER — Telehealth: Payer: Self-pay | Admitting: Pulmonary Disease

## 2014-07-11 ENCOUNTER — Ambulatory Visit (INDEPENDENT_AMBULATORY_CARE_PROVIDER_SITE_OTHER): Payer: Medicare Other | Admitting: Adult Health

## 2014-07-11 ENCOUNTER — Encounter: Payer: Self-pay | Admitting: Adult Health

## 2014-07-11 VITALS — BP 116/64 | HR 102 | Temp 98.9°F | Ht 66.0 in | Wt >= 6400 oz

## 2014-07-11 DIAGNOSIS — Z23 Encounter for immunization: Secondary | ICD-10-CM

## 2014-07-11 DIAGNOSIS — D869 Sarcoidosis, unspecified: Secondary | ICD-10-CM | POA: Diagnosis not present

## 2014-07-11 DIAGNOSIS — R0602 Shortness of breath: Secondary | ICD-10-CM | POA: Diagnosis not present

## 2014-07-11 MED ORDER — PREDNISONE 10 MG PO TABS: ORAL_TABLET | ORAL | Status: DC

## 2014-07-11 MED ORDER — HYDROCODONE-HOMATROPINE 5-1.5 MG/5ML PO SYRP
5.0000 mL | ORAL_SOLUTION | Freq: Four times a day (QID) | ORAL | Status: DC | PRN
Start: 2014-07-11 — End: 2014-09-05

## 2014-07-11 MED ORDER — FUROSEMIDE 40 MG PO TABS: 40.0000 mg | ORAL_TABLET | Freq: Every day | ORAL | Status: DC | PRN

## 2014-07-11 NOTE — Telephone Encounter (Signed)
Called and spoke with pt and she stated that the hydromet is not covered by her insurance company and she cannot afford to pay the $56  For it. Pt is requesting something else to help with the cough.  TP please advise. Thanks  No Known Allergies Current Outpatient Prescriptions on File Prior to Visit  Medication Sig Dispense Refill  . acetaminophen (TYLENOL) 325 MG tablet Take 650 mg by mouth every 6 (six) hours as needed for mild pain.      Marland Kitchen acetaminophen-codeine (TYLENOL #3) 300-30 MG per tablet Take 1 tablet by mouth every 8 (eight) hours as needed for moderate pain.      Marland Kitchen albuterol (PROVENTIL HFA;VENTOLIN HFA) 108 (90 BASE) MCG/ACT inhaler Inhale 1-2 puffs into the lungs every 6 (six) hours as needed for wheezing.  1 Inhaler  0  . ferrous sulfate 325 (65 FE) MG tablet Take 1 tablet (325 mg total) by mouth 3 (three) times daily with meals.  90 tablet  0  . furosemide (LASIX) 40 MG tablet Take 1 tablet (40 mg total) by mouth daily as needed for edema.  30 tablet  2  . HYDROcodone-acetaminophen (NORCO) 10-325 MG per tablet Take 1 tablet by mouth every 6 (six) hours as needed.      Marland Kitchen HYDROcodone-homatropine (HYDROMET) 5-1.5 MG/5ML syrup Take 5 mLs by mouth every 6 (six) hours as needed.  240 mL  0  . levothyroxine (SYNTHROID, LEVOTHROID) 100 MCG tablet Take 1 tablet (100 mcg total) by mouth daily at 6 (six) AM.  30 tablet  4  . losartan-hydrochlorothiazide (HYZAAR) 50-12.5 MG per tablet Take 1 tablet by mouth every morning.  30 tablet  4  . meloxicam (MOBIC) 7.5 MG tablet Take 7.5 mg by mouth 3 (three) times daily.      . methocarbamol (ROBAXIN) 500 MG tablet Take 500 mg by mouth every 6 (six) hours as needed for muscle spasms.      . NON FORMULARY 2 liter of oxygen      . predniSONE (DELTASONE) 10 MG tablet Take 0.5 tablets by mouth daily.       . predniSONE (DELTASONE) 10 MG tablet 2 tabs for 1 week ,then 1 tab daily for 1 week then back to 5 mg daily  21 tablet  0  . triamcinolone (KENALOG)  0.1 % ointment Apply 1 application topically as needed. Applies prior to sun exposure      . triamcinolone acetonide (KENALOG) 40 MG/ML injection Inject 40 mg into the muscle every 3 (three) months. For knee pain       No current facility-administered medications on file prior to visit.

## 2014-07-11 NOTE — Assessment & Plan Note (Signed)
?  Flare with bronchitis and AR   Plan  Increase Prednisone 20mg  daily for 1 week then 10mg  daily for 1 week and then back to 5mg  daily  Delsym 2 tsp Twice daily  As needed   Hydromet 1-2 tsp every 4hr As needed for severe cough   , may cause sleepiness Zyrtec 10mg  At bedtime  As needed  Drainage  May take Lasix 40mg  daily As needed for swelling.  Follow up 2 months as planned and As needed   Continue on CPAP At bedtime   Please contact office for sooner follow up if symptoms do not improve or worsen or seek emergency care ]

## 2014-07-11 NOTE — Patient Instructions (Signed)
Increase Prednisone 20mg  daily for 1 week then 10mg  daily for 1 week and then back to 5mg  daily  Delsym 2 tsp Twice daily  As needed   Hydromet 1-2 tsp every 4hr As needed for severe cough   , may cause sleepiness Zyrtec 10mg  At bedtime  As needed  Drainage  May take Lasix 40mg  daily As needed for swelling.  Follow up 2 months as planned and As needed   Continue on CPAP At bedtime   Please contact office for sooner follow up if symptoms do not improve or worsen or seek emergency care ]

## 2014-07-11 NOTE — Progress Notes (Signed)
Subjective:    Patient ID: [name], female    DOB: 1966/01/03, 48 y.o.   MRN: [id number]  HPI   [name]   48 yo morbidly obese woman with BL infiltrates & hilar and mediastinal lymphadenopathy , presumed sarcoid (although TBBX neg ) with skin involvement  Significant tests/ events:  First seen in hospital 01/26/11 for pulmonary consult for acute dyspnea and hypoxia found to have OHS and OSA with recs for continuous O2 ( 2 L/m at rest & 4L/m on walking)and Nocturnal CPAP  Echo- mild LVH , EF 55-60% . VQ scan intermed prob , doppler neg. CT chest was neg for PE.  CPAP titration 03/2011 (wt 479 lbs) -CPAP 7 cm to stop snoring, No desaturation on O2 2 L/min   09/2011 - Hosp adm for hemoptysis & BL infiltrates on CT,p-anca pos 1.80, gbm neg, ANA neg, ESR 42  Labs c/w fe def anemia, UA pos blood (was having periods) >> rpt UA no RBCs  Underwent endometrial ablation for menorrhagia   08/2012 admitted for atypical chest pain, and shortness of breath.  CT chest was negative for PE, notable, hilar and mediastinal lymphadenopathy consistent with worsening sarcoidosis and cardiomegaly  C-ANCA 1:40, ANA 1:320 , homogenous  ACE level 21   10/02/2012 TBBX >> no granulomas, BAL neg afb, fungal  12/2012 -Was seen by Dr. [name] at [place].  repeat labs>> neg pANCA and cANCA ,ESR 48 and neg ANA , low ACE level    CT chest  12/2013 >decreased hilar /mediastinal adenopathy-(done 1 week after steroids started )  >Pred decreased to 68m daily   CT chest 04/24/14 - Scattered pulmonary opacities with ground-glass densities, clustered  reticulonodular densities and interstitial thickening  06/12/14  Started on prednisone 12/2013 for  new brown spots on her thighs, facial rash,  joint pains & increased dyspnea infx  Pred tapered to off in mid July, Now rash has returned, also increased resp symptoms as above Does not wear CPAP, but does wear O2 qhs.  She has a waxing rash  along the legs, arms, and face. Patient reports previous biopsy of, arm, rash, by dermatology was consistent with sun exposure dermatitis.  We discussed the risk of steroid use.  She denies any chest pain, orthopnea, PND, increased leg swelling, or hemoptysis.  She says that since starting steroids. Her joint pains, especially in her knees and substantially decreased.   07/11/2014 Follow up (OSA/OHS , Sarcoid) Pt returns for 1 week follow up for acute bronchitis . Tx with Omnicef x 5 d and started on Lasix 421m. Pt finished ABX without difficulty . pt was sent only 3 days of lasix in liquid form . She had a good response w/ decreaesd swelling and wt drop of 18lbs . Previous echo earlier this year showed EF55%, w/ no pulm HTN noted or sign diastolic dysfxn . She has chronic LE edema /venous insufficiency w/ her morbid obesity.  Complains of persistent cough, that is keeping her up at night. Has a lot of post nasal drip   No fever, discolored mucus, hemoptysis.    Review of Systems neg for any significant sore throat, dysphagia, itching,  or excess/ purulent secretions, fever, chills, sweats, unintended wt loss, pleuritic or exertional cp, hempoptysis, orthopnea pnd or change in chronic leg swelling. Also denies presyncope, palpitations, heartburn, abdominal pain, nausea, vomiting, diarrhea or change in bowel or urinary habits, dysuria,hematuria, rash, arthralgias, visual complaints, headache, numbness weakness or ataxia.  Objective:   Physical Exam   Gen. Pleasant, obese, in no distress ENT - no lesions, no post nasal drip Neck: No JVD, no thyromegaly, no carotid bruits Lungs: no use of accessory muscles, no dullness to percussion, decreased without rales or rhonchi  Cardiovascular: Rhythm regular, heart sounds  normal, no murmurs or gallops, 1+ peripheral edema/venous insufficiency changes  Musculoskeletal: No deformities, no cyanosis or clubbing , no tremors       Assessment & Plan:

## 2014-07-11 NOTE — Addendum Note (Signed)
Addended by: Parke Poisson E on: 07/11/2014 09:44 AM   Modules accepted: Orders

## 2014-07-11 NOTE — Assessment & Plan Note (Signed)
?   Fluid overload, previous echo in past was norm  She improved with brief diuresis challenge w/ sign wt loss of 18 lbs.  Advised can use this on as needed basis  Will rx 40mg  lasix As needed   Check labs with bmet and bnp .

## 2014-07-11 NOTE — Telephone Encounter (Signed)
Called made pt aware. She did buy delsym. Nothing further needed

## 2014-07-11 NOTE — Telephone Encounter (Signed)
That is the generic cough syrup.  None are covered by medicare or medicare  Can use delsym otc ~10-15$

## 2014-07-14 NOTE — Progress Notes (Signed)
Reviewed & agree with plan  

## 2014-07-28 ENCOUNTER — Telehealth: Payer: Self-pay | Admitting: Pulmonary Disease

## 2014-07-28 MED ORDER — PREDNISONE 5 MG PO TABS: 5.0000 mg | ORAL_TABLET | Freq: Every day | ORAL | Status: DC

## 2014-07-28 NOTE — Telephone Encounter (Signed)
Per 07/11/14 OV with TP: Patient Instructions      Increase Prednisone 20mg  daily for 1 week then 10mg  daily for 1 week and then back to 5mg  daily   Delsym 2 tsp Twice daily  As needed    Hydromet 1-2 tsp every 4hr As needed for severe cough   , may cause sleepiness Zyrtec 10mg  At bedtime  As needed  Drainage   May take Lasix 40mg  daily As needed for swelling.   Follow up 2 months as planned and As needed   Continue on CPAP At bedtime   Please contact office for sooner follow up if symptoms do not improve or worsen or seek emergency care ]   Spoke with pt. Aware RX for pred 5 mg sent in. nothing further needed

## 2014-08-07 ENCOUNTER — Other Ambulatory Visit: Payer: Self-pay | Admitting: *Deleted

## 2014-08-07 MED ORDER — PREDNISONE 5 MG PO TABS: 5.0000 mg | ORAL_TABLET | Freq: Every day | ORAL | Status: DC

## 2014-08-12 ENCOUNTER — Telehealth: Payer: Self-pay | Admitting: Internal Medicine

## 2014-08-12 NOTE — Telephone Encounter (Signed)
Returned patient's call. Complained of left sided chest wall pain (under the left breast), sudden onset, started this evening, would last a few minutes, now constant. Rate 6/10 pain intensity, a\w with some moderate sob with deep inspiration, reproducible with palpation, worst when laying on the left side.  No diaphoresis, no jaw pain\tingling, no left arm numbness. Patient states that she has had some coughing spells over the last couple of days, especially at night.  Chest pain seems musculoskeletal - advise patient to take her hydromet, and aleve (400mg ) once.  If pain becomes worst especially with sob\diaphoresis jaw\arm numbness then she is to seek immediate medical attention. Also, advise patient to call office in the morning for possible acute care visit, especially if pain is still present.   Vilinda Boehringer, MD [facility] Pager [phone] On Call Pager [phone]

## 2014-08-13 ENCOUNTER — Telehealth: Payer: Self-pay | Admitting: *Deleted

## 2014-08-13 ENCOUNTER — Encounter: Payer: Self-pay | Admitting: Internal Medicine

## 2014-08-13 ENCOUNTER — Ambulatory Visit (INDEPENDENT_AMBULATORY_CARE_PROVIDER_SITE_OTHER): Payer: Medicare Other | Admitting: Internal Medicine

## 2014-08-13 VITALS — BP 118/80 | HR 102 | Temp 98.1°F | Ht 66.0 in | Wt >= 6400 oz

## 2014-08-13 DIAGNOSIS — R1012 Left upper quadrant pain: Secondary | ICD-10-CM

## 2014-08-13 DIAGNOSIS — R05 Cough: Secondary | ICD-10-CM

## 2014-08-13 DIAGNOSIS — R058 Other specified cough: Secondary | ICD-10-CM

## 2014-08-13 MED ORDER — FAMOTIDINE 20 MG PO TABS: ORAL_TABLET | ORAL | Status: DC

## 2014-08-13 MED ORDER — PANTOPRAZOLE SODIUM 40 MG PO TBEC: 40.0000 mg | DELAYED_RELEASE_TABLET | Freq: Every day | ORAL | Status: DC

## 2014-08-13 NOTE — Progress Notes (Signed)
Subjective:    Patient ID: [name], female    DOB: 11/24/1965, 48 y.o.   MRN: [id number]  HPI   PCP - [name]   48 yo morbidly obese woman with BL infiltrates & hilar and mediastinal lymphadenopathy , presumed sarcoid (although TBBX neg ) with skin involvement  Significant tests/ events:  First seen in hospital 01/26/11 for pulmonary consult for acute dyspnea and hypoxia found to have OHS and OSA with recs for continuous O2 ( 2 L/m at rest & 4L/m on walking)and Nocturnal CPAP  Echo- mild LVH , EF 55-60% . VQ scan intermed prob , doppler neg. CT chest was neg for PE.  CPAP titration 03/2011 (wt 479 lbs) -CPAP 7 cm to stop snoring, No desaturation on O2 2 L/min   09/2011 - Hosp adm for hemoptysis & BL infiltrates on CT,p-anca pos 1.80, gbm neg, ANA neg, ESR 42  Labs c/w fe def anemia, UA pos blood (was having periods) >> rpt UA no RBCs  Underwent endometrial ablation for menorrhagia   08/2012 admitted for atypical chest pain, and shortness of breath.  CT chest was negative for PE, notable, hilar and mediastinal lymphadenopathy consistent with worsening sarcoidosis and cardiomegaly  C-ANCA 1:40, ANA 1:320 , homogenous  ACE level 21   10/02/2012 TBBX >> no granulomas, BAL neg afb, fungal  12/2012 -Was seen by Dr. Trudie Macias at [place].  repeat labs>> neg pANCA and cANCA ,ESR 48 and neg ANA , low ACE level    CT chest  12/2013 >decreased hilar /mediastinal adenopathy-(done 1 week after steroids started )  >Pred decreased to 38m daily   CT chest 04/24/14 - Scattered pulmonary opacities with ground-glass densities, clustered  reticulonodular densities and interstitial thickening  06/12/14  Started on prednisone 12/2013 for  new brown spots on her thighs, facial rash,  joint pains & increased dyspnea infx  Pred tapered to off in mid July, Now rash has returned, also increased resp symptoms as above Does not wear CPAP, but does wear O2 qhs.  She has a waxing rash  along the legs, arms, and face. Patient reports previous biopsy of, arm, rash, by dermatology was consistent with sun exposure dermatitis.  We discussed the risk of steroid use.  She denies any chest pain, orthopnea, PND, increased leg swelling, or hemoptysis.  She says that since starting steroids. Her joint pains, especially in her knees and substantially decreased.   07/11/2014 Follow up (OSA/OHS , Sarcoid) Pt returns for 1 week follow up for acute bronchitis . Tx with Omnicef x 5 d and started on Lasix 483m. Pt finished ABX without difficulty . pt was sent only 3 days of lasix in liquid form . She had a good response w/ decreaesd swelling and wt drop of 18lbs . Previous echo earlier this year showed EF55%, w/ no pulm HTN noted or sign diastolic dysfxn . She has chronic LE edema /venous insufficiency w/ her morbid obesity.  Complains of persistent cough, that is keeping her up at night. Has a lot of post nasal drip   No fever, discolored mucus, hemoptysis.  rec Increase Prednisone 2048maily for 1 week then 79m29mily for 1 week and then back to 5mg 14mly  Delsym 2 tsp Twice daily  As needed   Hydromet 1-2 tsp every 4hr As needed for severe cough   , may cause sleepiness Zyrtec 79mg 58medtime  As needed  Drainage  May take Lasix 40mg d36m As needed for swelling.  Follow up 2 months as planned and As needed   Continue on CPAP At bedtime     08/13/2014 acute  ov/[name] re:  cough [name] since august 2015  Chief Complaint  Patient presents with  . Follow-up    SOB productive cough yelllwish   tylenol #3 helps the most  Increasing pred and abx did not help cough or sob previously  Pain under L breast x weeks, worse with cough, no nausea or change with meals or belching  No obvious day to day or daytime variabilty or assoc  cp or chest tightness, subjective wheeze overt sinus or hb symptoms. No unusual exp hx or h/o childhood pna/ asthma or knowledge of premature birth.  Sleeping ok  without nocturnal  or early am exacerbation  of respiratory  c/o's or need for noct saba. Also denies any obvious fluctuation of symptoms with weather or environmental changes or other aggravating or alleviating factors except as outlined above   Current Medications, Allergies, Complete Past Medical History, Past Surgical History, Family History, and Social History were reviewed in [employer] record.  ROS  The following are not active complaints unless bolded sore throat, dysphagia, dental problems, itching, sneezing,  nasal congestion or excess/ purulent secretions, ear ache,   fever, chills, sweats, unintended wt loss, pleuritic or exertional cp, hemoptysis,  orthopnea pnd or leg swelling, presyncope, palpitations, heartburn, abdominal pain, anorexia, nausea, vomiting, diarrhea  or change in bowel or urinary habits, change in stools or urine, dysuria,hematuria,  rash, arthralgias, visual complaints, headache, numbness weakness or ataxia or problems with walking or coordination,  change in mood/affect or memory.            Objective:   Physical Exam  Wt Readings from Last 3 Encounters:  08/13/14 472 lb 9.6 oz (214.37 kg)  07/11/14 462 lb (209.562 kg)  07/04/14 480 lb (217.727 kg)      Gen. Pleasant, obese, in no distress ENT - no lesions, no post nasal drip Neck: No JVD, no thyromegaly, no carotid bruits Lungs: no use of accessory muscles, no dullness to percussion, decreased without rales or rhonchi  Cardiovascular: Rhythm regular, heart sounds  normal, no murmurs or gallops, 1+ peripheral edema/venous insufficiency changes  Musculoskeletal: No deformities, no cyanosis or clubbing , no tremors Abd Pos superficial  Muscle tenderness LUQ, reproduces pain, no bruising        Assessment & Plan:

## 2014-08-13 NOTE — Patient Instructions (Addendum)
The key to effective treatment for your cough is eliminating the non-stop cycle of cough you're stuck in long enough to let your airway heal completely and then see if there is anything still making you cough once you stop the cough suppression, but this should take no more than 5 days to figure out  First take delsym two tsp every 12 hours and supplement if needed with  Tylenol 3#  up to 2 every 4 hours to suppress the urge to cough at all or even clear your throat. Swallowing water or using ice chips/non mint and menthol containing candies (such as lifesavers or sugarless jolly ranchers) are also effective.  You should rest your voice and avoid activities that you know make you cough.  Once you have eliminated the cough for 3 straight days try reducing the tylenol #3  first,  then the delsym as tolerated.    Protonix (pantoprazole) Take 30-60 min before first meal of the day and Pepcid 20 mg one bedtime plus chlorpheniramine 4 mg x 2 at bedtime (both available over the counter)  until cough is completely gone for at least a week without the need for cough suppression  GERD (REFLUX)  is an extremely common cause of respiratory symptoms, many times with no significant heartburn at all.    It can be treated with medication, but also with lifestyle changes including avoidance of late meals, excessive alcohol, smoking cessation, and avoid fatty foods, chocolate, peppermint, colas, red wine, and acidic juices such as orange juice.  NO MINT OR MENTHOL PRODUCTS SO NO COUGH DROPS  USE HARD CANDY INSTEAD (jolley ranchers or Stover's or Lifesavers (all available in sugarless versions) NO OIL BASED VITAMINS - use powdered substitutes.  Please schedule a follow up office visit in 2 weeks, sooner if needed to see Elsworth Soho or Tammy

## 2014-08-13 NOTE — Telephone Encounter (Signed)
Message copied by Inge Rise on Wed Aug 13, 2014 10:11 AM ------      Message from: Kara Mead V      Created: Wed Aug 13, 2014 12:32 AM       Please check on her - if still has pain, set up OV with TP ------

## 2014-08-13 NOTE — Telephone Encounter (Signed)
Called pt. She reports she is wheezing, having chest tx. TP not in today. appt scheduled to see MW at 19 today.

## 2014-08-15 DIAGNOSIS — R058 Other specified cough: Secondary | ICD-10-CM | POA: Insufficient documentation

## 2014-08-15 DIAGNOSIS — R05 Cough: Secondary | ICD-10-CM | POA: Insufficient documentation

## 2014-08-15 DIAGNOSIS — R1012 Left upper quadrant pain: Secondary | ICD-10-CM | POA: Insufficient documentation

## 2014-08-15 NOTE — Assessment & Plan Note (Signed)
Since not better with abx or higher dose prednisone strongly doubt sarcoid but more likely this is  Classic Upper airway cough syndrome, so named because it's frequently impossible to sort out how much is  CR/sinusitis with freq throat clearing (which can be related to primary GERD)   vs  causing  secondary (" extra esophageal")  GERD from wide swings in gastric pressure that occur with throat clearing, often  promoting self use of mint and menthol lozenges that reduce the lower esophageal sphincter tone and exacerbate the problem further in a cyclical fashion.   These are the same pts (now being labeled as having "irritable larynx syndrome" by some cough centers) who not infrequently have a history of having failed to tolerate ace inhibitors,  dry powder inhalers or biphosphonates or report having atypical reflux symptoms that don't respond to standard doses of PPI , and are easily confused as having aecopd or asthma flares by even experienced allergists/ pulmonologists.   rec max gerd/ cyclical cough regimen then regroup

## 2014-08-15 NOTE — Assessment & Plan Note (Signed)
Very likely this was caused by coughing/ try elimination of cough first then regroup

## 2014-08-27 ENCOUNTER — Ambulatory Visit: Payer: Medicare Other | Admitting: Adult Health

## 2014-08-29 ENCOUNTER — Other Ambulatory Visit: Payer: Self-pay | Admitting: Adult Health

## 2014-08-29 MED ORDER — FUROSEMIDE 40 MG PO TABS: 40.0000 mg | ORAL_TABLET | Freq: Every day | ORAL | Status: DC | PRN

## 2014-09-04 ENCOUNTER — Ambulatory Visit: Payer: Medicare Other | Admitting: Adult Health

## 2014-09-05 ENCOUNTER — Encounter: Payer: Self-pay | Admitting: Adult Health

## 2014-09-05 ENCOUNTER — Ambulatory Visit (INDEPENDENT_AMBULATORY_CARE_PROVIDER_SITE_OTHER): Payer: Medicare Other | Admitting: Adult Health

## 2014-09-05 VITALS — BP 126/82 | HR 95 | Temp 97.9°F | Ht 66.0 in | Wt >= 6400 oz

## 2014-09-05 DIAGNOSIS — D869 Sarcoidosis, unspecified: Secondary | ICD-10-CM | POA: Diagnosis not present

## 2014-09-05 NOTE — Progress Notes (Signed)
Subjective:    Patient ID: [name], female    DOB: 1966-07-20, 48 y.o.   MRN: [id number]  HPI   [name]   48 yo morbidly obese woman with BL infiltrates & hilar and mediastinal lymphadenopathy , presumed sarcoid (although TBBX neg ) with skin involvement  Significant tests/ events:  First seen in hospital 01/26/11 for pulmonary consult for acute dyspnea and hypoxia found to have OHS and OSA with recs for continuous O2 ( 2 L/m at rest & 4L/m on walking)and Nocturnal CPAP  Echo- mild LVH , EF 55-60% . VQ scan intermed prob , doppler neg. CT chest was neg for PE.  CPAP titration 03/2011 (wt 479 lbs) -CPAP 7 cm to stop snoring, No desaturation on O2 2 L/min   09/2011 - Hosp adm for hemoptysis & BL infiltrates on CT,p-anca pos 1.80, gbm neg, ANA neg, ESR 42  Labs c/w fe def anemia, UA pos blood (was having periods) >> rpt UA no RBCs  Underwent endometrial ablation for menorrhagia   08/2012 admitted for atypical chest pain, and shortness of breath.  CT chest was negative for PE, notable, hilar and mediastinal lymphadenopathy consistent with worsening sarcoidosis and cardiomegaly  C-ANCA 1:40, ANA 1:320 , homogenous  ACE level 21   10/02/2012 TBBX >> no granulomas, BAL neg afb, fungal  12/2012 -Was seen by Dr. [name] at [place].  repeat labs>> neg pANCA and cANCA ,ESR 48 and neg ANA , low ACE level    CT chest  12/2013 >decreased hilar /mediastinal adenopathy-(done 1 week after steroids started )  >Pred decreased to 51m daily   CT chest 04/24/14 - Scattered pulmonary opacities with ground-glass densities, clustered  reticulonodular densities and interstitial thickening  06/12/14  Started on prednisone 12/2013 for  new brown spots on her thighs, facial rash,  joint pains & increased dyspnea infx  Pred tapered to off in mid July, Now rash has returned, also increased resp symptoms as above Does not wear CPAP, but does wear O2 qhs.  She has a waxing rash  along the legs, arms, and face. Patient reports previous biopsy of, arm, rash, by dermatology was consistent with sun exposure dermatitis.  We discussed the risk of steroid use.  She denies any chest pain, orthopnea, PND, increased leg swelling, or hemoptysis.  She says that since starting steroids. Her joint pains, especially in her knees and substantially decreased.   07/11/14  Follow up (OSA/OHS , Sarcoid) Pt returns for 1 week follow up for acute bronchitis . Tx with Omnicef x 5 d and started on Lasix 424m. Pt finished ABX without difficulty . pt was sent only 3 days of lasix in liquid form . She had a good response w/ decreaesd swelling and wt drop of 18lbs . Previous echo earlier this year showed EF55%, w/ no pulm HTN noted or sign diastolic dysfxn . She has chronic LE edema /venous insufficiency w/ her morbid obesity.  Complains of persistent cough, that is keeping her up at night. Has a lot of post nasal drip   No fever, discolored mucus, hemoptysis.   09/05/2014 Follow up OSA/OHS , Sarcoid) Returns for follow up.  Seen last ov , tx w/ cough meds.  Reports DOE is unchanged since last ov with chest tightness, wheezing and PND.   Denies any cough, purulent sputum, f/c/s, n/v/d.  Works partime from home.  Wants lightweight oxygen tank .  No rash, chest pain, orthopnea, increased leg swelling or fever.  Wt continues to climb, does not exercise or get out of her house much .  Discussed wt loss options.     Review of Systems neg for any significant sore throat, dysphagia, itching,  or excess/ purulent secretions, fever, chills, sweats, unintended wt loss, pleuritic or exertional cp, hempoptysis, orthopnea pnd or change in chronic leg swelling. Also denies presyncope, palpitations, heartburn, abdominal pain, nausea, vomiting, diarrhea or change in bowel or urinary habits, dysuria,hematuria, rash, arthralgias, visual complaints, headache, numbness weakness or ataxia.      Objective:    Physical Exam   Gen. Pleasant, obese, in no distress ENT - no lesions, no post nasal drip Neck: No JVD, no thyromegaly, no carotid bruits Lungs: no use of accessory muscles, no dullness to percussion, decreased without rales or rhonchi  Cardiovascular: Rhythm regular, heart sounds  normal, no murmurs or gallops, tr-1+ peripheral edema/venous insufficiency changes  Musculoskeletal: No deformities, no cyanosis or clubbing , no tremors       Assessment & Plan:

## 2014-09-05 NOTE — Progress Notes (Signed)
Reviewed & agree with plan  

## 2014-09-05 NOTE — Assessment & Plan Note (Addendum)
Flare  Check BMET and BNP  Need PFT    Plan Increase Prednisone 10mg  daily  We will send order for portable concentrator to DME.  Follow up Dr. Elsworth Soho  In 6 weeks with PFT and As needed   Please contact office for sooner follow up if symptoms do not improve or worsen or seek emergency care

## 2014-09-05 NOTE — Addendum Note (Signed)
Addended by: Devona Konig on: 09/05/2014 10:26 AM   Modules accepted: Orders

## 2014-09-05 NOTE — Patient Instructions (Signed)
Increase Prednisone 10mg  daily  We will send order for portable concentrator to DME.  Follow up Dr. Elsworth Soho  In 6 weeks with PFT and As needed   Please contact office for sooner follow up if symptoms do not improve or worsen or seek emergency care

## 2014-09-05 NOTE — Assessment & Plan Note (Signed)
Continue on CPAP  Wt loss discussion

## 2014-09-08 ENCOUNTER — Telehealth: Payer: Self-pay | Admitting: *Deleted

## 2014-09-08 DIAGNOSIS — M1712 Unilateral primary osteoarthritis, left knee: Secondary | ICD-10-CM | POA: Diagnosis not present

## 2014-09-08 DIAGNOSIS — M1711 Unilateral primary osteoarthritis, right knee: Secondary | ICD-10-CM | POA: Diagnosis not present

## 2014-09-08 NOTE — Telephone Encounter (Signed)
Pt seen 11.6.15 by TP CPAP download needed Called [facility] to obtain (not available on airview) and per Shanon Brow, email has been sent to [facility] for someone to call back Will leave in my inbox

## 2014-09-11 NOTE — Telephone Encounter (Signed)
Download still not recieved Left message with Chasity w/ [facility] to have this faxed tomorrow 11/13 to my attention

## 2014-09-12 ENCOUNTER — Ambulatory Visit: Payer: Medicare Other | Admitting: Adult Health

## 2014-09-19 NOTE — Telephone Encounter (Signed)
Jess has not received download Hedda Slade a staff message to fax this to triage.

## 2014-10-06 NOTE — Telephone Encounter (Signed)
Have been messaging Melissa w/ [facility] via staff message Per last message from [place]: Graylon Good.   My RT dept let me know that they have called this pt twice to let her know that she needed to bring her SD card in for a download. They left messages both times but the pt has not yet come in with her card.  I am verifying with them that they have both phone numbers listed in Epic.   Nothing further needed at this time; will sign off.

## 2014-10-07 ENCOUNTER — Other Ambulatory Visit: Payer: Self-pay | Admitting: Pulmonary Disease

## 2014-10-07 ENCOUNTER — Ambulatory Visit: Payer: Medicare Other | Admitting: Family

## 2014-11-07 ENCOUNTER — Other Ambulatory Visit: Payer: Self-pay | Admitting: Pulmonary Disease

## 2014-11-07 DIAGNOSIS — R05 Cough: Secondary | ICD-10-CM

## 2014-11-07 DIAGNOSIS — R059 Cough, unspecified: Secondary | ICD-10-CM

## 2014-11-10 ENCOUNTER — Ambulatory Visit: Payer: Medicare Other | Admitting: Pulmonary Disease

## 2014-11-13 ENCOUNTER — Encounter: Payer: Self-pay | Admitting: Pulmonary Disease

## 2014-11-13 ENCOUNTER — Ambulatory Visit (INDEPENDENT_AMBULATORY_CARE_PROVIDER_SITE_OTHER): Payer: Medicare Other | Admitting: Pulmonary Disease

## 2014-11-13 VITALS — BP 126/74 | HR 101 | Temp 97.1°F | Ht 65.75 in | Wt >= 6400 oz

## 2014-11-13 DIAGNOSIS — D869 Sarcoidosis, unspecified: Secondary | ICD-10-CM

## 2014-11-13 DIAGNOSIS — R059 Cough, unspecified: Secondary | ICD-10-CM

## 2014-11-13 DIAGNOSIS — R05 Cough: Secondary | ICD-10-CM

## 2014-11-13 DIAGNOSIS — G4733 Obstructive sleep apnea (adult) (pediatric): Secondary | ICD-10-CM | POA: Diagnosis not present

## 2014-11-13 LAB — PULMONARY FUNCTION TEST
DL/VA % PRED: 88 %
DL/VA: 4.41 ml/min/mmHg/L
DLCO UNC % PRED: 44 %
DLCO unc: 11.75 ml/min/mmHg
FEF 25-75 POST: 2.93 L/s
FEF 25-75 Pre: 2.28 L/sec
FEF2575-%CHANGE-POST: 28 %
FEF2575-%PRED-POST: 112 %
FEF2575-%Pred-Pre: 87 %
FEV1-%CHANGE-POST: 7 %
FEV1-%Pred-Post: 62 %
FEV1-%Pred-Pre: 57 %
FEV1-Post: 1.56 L
FEV1-Pre: 1.45 L
FEV1FVC-%CHANGE-POST: 0 %
FEV1FVC-%Pred-Pre: 111 %
FEV6-%Change-Post: 8 %
FEV6-%PRED-POST: 56 %
FEV6-%Pred-Pre: 52 %
FEV6-POST: 1.71 L
FEV6-PRE: 1.58 L
FEV6FVC-%PRED-PRE: 103 %
FEV6FVC-%Pred-Post: 103 %
FVC-%Change-Post: 8 %
FVC-%Pred-Post: 55 %
FVC-%Pred-Pre: 50 %
FVC-Post: 1.71 L
FVC-Pre: 1.58 L
PRE FEV1/FVC RATIO: 91 %
Post FEV1/FVC ratio: 91 %
Post FEV6/FVC ratio: 100 %
Pre FEV6/FVC Ratio: 100 %

## 2014-11-13 MED ORDER — PREDNISONE 5 MG PO TABS: ORAL_TABLET | ORAL | Status: DC

## 2014-11-13 NOTE — Assessment & Plan Note (Signed)
We will ask [facility] to get you a new card for your machine to check report  Weight loss encouraged, compliance with goal of at least 4-6 hrs every night is the expectation. Advised against medications with sedative side effects Cautioned against driving when sleepy - understanding that sleepiness will vary on a day to day basis

## 2014-11-13 NOTE — Patient Instructions (Signed)
Stay on 5mg  prednisone daily  On feb 1 st, drop to 5 mg M/W/F STay on oxygen We will ask [facility] to get you a new card for your machine to check report

## 2014-11-13 NOTE — Assessment & Plan Note (Signed)
Instead of her taking prednisone 'as needed', we will taper to lowest required dose starting with  5mg  prednisone daily  On feb 1 st, drop to 5 mg M/W/F STay on oxygen - doubt she will qualify for POC since she needs continuous

## 2014-11-13 NOTE — Progress Notes (Signed)
Subjective:    Patient ID: [name], female    DOB: 1966/06/06, 49 y.o.   MRN: [id number]  HPI  [name]   49 yo morbidly obese woman with OSA & BL infiltrates & hilar and mediastinal lymphadenopathy , presumed sarcoid (although TBBX neg ) with skin involvement  Significant tests/ events:  First seen in hospital 01/26/11  for acute dyspnea and hypoxia found to have OHS and OSA with recs for continuous O2 ( 2 L/m at rest & 4L/m on walking)and Nocturnal CPAP  Echo- mild LVH , EF 55-60% . VQ scan intermed prob , doppler neg. CT chest was neg for PE.  CPAP titration 03/2011 (wt 479 lbs) -CPAP 7 cm to stop snoring, No desaturation on O2 2 L/min   09/2011 - Hosp adm for hemoptysis & BL infiltrates on CT,p-anca pos 1.80, gbm neg, ANA neg, ESR 42  Labs c/w fe def anemia, UA pos blood (was having periods) >> rpt UA no RBCs  Underwent endometrial ablation for menorrhagia   08/2012 admitted for atypical chest pain, and shortness of breath.  CT chest was negative for PE, notable, hilar and mediastinal lymphadenopathy consistent with worsening sarcoidosis and cardiomegaly  C-ANCA 1:40, ANA 1:320 , homogenous  ACE level 21   10/02/2012 TBBX >> no granulomas, BAL neg afb, fungal  12/2012 -Was seen by Dr. [name] at [place] [facility]. repeat labs>> neg pANCA and cANCA ,ESR 48 and neg ANA , low ACE level    CT chest 12/2013 >decreased hilar /mediastinal adenopathy-(done 1 week after steroids started )  >Pred decreased to 52m daily   CT chest 04/24/14 - Scattered pulmonary opacities with ground-glass densities, clustered reticulonodular densities and interstitial thickening  06/12/14  Started on prednisone 12/2013 for new brown spots on her thighs, facial rash, joint pains & increased dyspnea infx  Pred tapered to off in mid July,then resumed 05/2014 She has a waxing rash along the legs, arms, and face. Patient reports previous biopsy of, arm, rash, by  dermatology was consistent with sun exposure dermatitis.     07/11/14 acute bronchitis . Tx with Omnicef x 5 d and started on Lasix 462m.   11/13/2014  Chief Complaint  Patient presents with  . Follow-up    w/ PFT. Breathing unchanged, slight prod cough (green phlem), PND, nasal congestion. No wheezing, no chest tx. Pt using O2 24/7.    Has self tapered - Taking 10 mg pred twice a week. Reports DOE is unchanged since last ov Denies any cough, purulent sputum, f/c/s, n/v/d.  Works partime from home.  Wants lightweight oxygen tank  -more compliant with O2  She does not have card in her CPAP to turn in Weight unchanged No rash, chest pain, orthopnea, increased leg swelling or fever.     Review of Systems neg for any significant sore throat, dysphagia, itching, sneezing, nasal congestion or excess/ purulent secretions, fever, chills, sweats, unintended wt loss, pleuritic or exertional cp, hempoptysis, orthopnea pnd or change in chronic leg swelling. Also denies presyncope, palpitations, heartburn, abdominal pain, nausea, vomiting, diarrhea or change in bowel or urinary habits, dysuria,hematuria, rash, arthralgias, visual complaints, headache, numbness weakness or ataxia.     Objective:   Physical Exam  Gen. Pleasant, obese, in no distress ENT - no lesions, no post nasal drip Neck: No JVD, no thyromegaly, no carotid bruits Lungs: no use of accessory muscles, no dullness to percussion, decreased without rales or rhonchi  Cardiovascular: Rhythm regular, heart sounds  normal, no murmurs or gallops, no peripheral edema Musculoskeletal: No deformities, no cyanosis or clubbing , no tremors       Assessment & Plan:

## 2014-11-13 NOTE — Progress Notes (Signed)
PFT done today. 

## 2014-11-24 ENCOUNTER — Telehealth: Payer: Self-pay | Admitting: Pulmonary Disease

## 2014-11-24 NOTE — Telephone Encounter (Signed)
Spoke with Lenna Sciara, states that pt needs an 02 qualifying walk for pt within 30 days of ov with RA on 11/13/14.  Will also need new order for 02 as pt has new insurance.    Spoke with pt, pt is scheduled to come in at 4 on Friday for qualifying walk.  Forwarding to [place] to watch out for and place new 02 order for [facility].

## 2014-11-27 NOTE — Telephone Encounter (Signed)
No, have not received anything for this patient.

## 2014-11-27 NOTE — Telephone Encounter (Signed)
Sharyn Lull have we received this yet? Thanks.

## 2014-11-28 ENCOUNTER — Ambulatory Visit (INDEPENDENT_AMBULATORY_CARE_PROVIDER_SITE_OTHER): Payer: Medicare Other

## 2014-11-28 DIAGNOSIS — D869 Sarcoidosis, unspecified: Secondary | ICD-10-CM

## 2014-11-28 DIAGNOSIS — G4733 Obstructive sleep apnea (adult) (pediatric): Secondary | ICD-10-CM | POA: Diagnosis not present

## 2014-12-01 NOTE — Telephone Encounter (Signed)
Pt came in on Friday for qualifying walk.  Forwarding to [place] to send order based on RA's specs.

## 2014-12-02 NOTE — Telephone Encounter (Signed)
2L O2 while walking & during sleep

## 2014-12-02 NOTE — Telephone Encounter (Signed)
Patient came in Friday for qualification walk:  Below are results.  Please advise what you would like to order.  Thanks.  SATURATION QUALIFICATIONS: (This note is used to comply with regulatory documentation for home oxygen)  Patient Saturations on Room Air at Rest = 97%  Patient Saturations on Room Air while Ambulating = 85%  Patient Saturations on 2 Liters of oxygen while Ambulating =95 %  Please briefly explain why patient needs home oxygen:Oxygen at 2 LPM w/ activity due to dropping with exertion Dme= [facility]

## 2014-12-02 NOTE — Telephone Encounter (Signed)
Patient came in for qualifying walk.

## 2014-12-04 NOTE — Telephone Encounter (Signed)
lmtcb for pt.  

## 2014-12-05 NOTE — Telephone Encounter (Signed)
Pt is aware that she needs use O2 with exertion and while sleeping. She already has oxygen in the home. Nothing further was needed.

## 2014-12-10 ENCOUNTER — Telehealth: Payer: Self-pay | Admitting: Pulmonary Disease

## 2014-12-10 DIAGNOSIS — E662 Morbid (severe) obesity with alveolar hypoventilation: Secondary | ICD-10-CM

## 2014-12-10 DIAGNOSIS — G4733 Obstructive sleep apnea (adult) (pediatric): Secondary | ICD-10-CM

## 2014-12-10 NOTE — Telephone Encounter (Signed)
Order has been placed for the pt and nothing further is needed

## 2014-12-11 ENCOUNTER — Telehealth: Payer: Self-pay | Admitting: Pulmonary Disease

## 2014-12-11 DIAGNOSIS — D869 Sarcoidosis, unspecified: Secondary | ICD-10-CM

## 2014-12-11 NOTE — Telephone Encounter (Signed)
Melissa aware this is placed. Nothing further needed

## 2015-01-07 ENCOUNTER — Other Ambulatory Visit: Payer: Self-pay | Admitting: Pulmonary Disease

## 2015-01-15 ENCOUNTER — Emergency Department (HOSPITAL_COMMUNITY): Payer: Medicare Other

## 2015-01-15 ENCOUNTER — Encounter (HOSPITAL_COMMUNITY): Payer: Self-pay | Admitting: *Deleted

## 2015-01-15 ENCOUNTER — Inpatient Hospital Stay (HOSPITAL_COMMUNITY)
Admission: EM | Admit: 2015-01-15 | Discharge: 2015-01-18 | DRG: 291 | Disposition: A | Discharge status: Home / Self Care | Payer: Medicare Other | Attending: Internal Medicine | Admitting: Internal Medicine

## 2015-01-15 DIAGNOSIS — D509 Iron deficiency anemia, unspecified: Secondary | ICD-10-CM

## 2015-01-15 DIAGNOSIS — J80 Acute respiratory distress syndrome: Secondary | ICD-10-CM | POA: Diagnosis not present

## 2015-01-15 DIAGNOSIS — J189 Pneumonia, unspecified organism: Secondary | ICD-10-CM | POA: Diagnosis present

## 2015-01-15 DIAGNOSIS — R0902 Hypoxemia: Secondary | ICD-10-CM | POA: Insufficient documentation

## 2015-01-15 DIAGNOSIS — Y95 Nosocomial condition: Secondary | ICD-10-CM | POA: Diagnosis present

## 2015-01-15 DIAGNOSIS — I10 Essential (primary) hypertension: Secondary | ICD-10-CM | POA: Diagnosis not present

## 2015-01-15 DIAGNOSIS — D649 Anemia, unspecified: Secondary | ICD-10-CM | POA: Diagnosis not present

## 2015-01-15 DIAGNOSIS — Z6841 Body Mass Index (BMI) 40.0 and over, adult: Secondary | ICD-10-CM | POA: Diagnosis not present

## 2015-01-15 DIAGNOSIS — J9620 Acute and chronic respiratory failure, unspecified whether with hypoxia or hypercapnia: Secondary | ICD-10-CM | POA: Diagnosis present

## 2015-01-15 DIAGNOSIS — I5031 Acute diastolic (congestive) heart failure: Secondary | ICD-10-CM | POA: Insufficient documentation

## 2015-01-15 DIAGNOSIS — Z7952 Long term (current) use of systemic steroids: Secondary | ICD-10-CM

## 2015-01-15 DIAGNOSIS — E662 Morbid (severe) obesity with alveolar hypoventilation: Secondary | ICD-10-CM | POA: Diagnosis present

## 2015-01-15 DIAGNOSIS — R0600 Dyspnea, unspecified: Secondary | ICD-10-CM | POA: Diagnosis not present

## 2015-01-15 DIAGNOSIS — D500 Iron deficiency anemia secondary to blood loss (chronic): Secondary | ICD-10-CM | POA: Diagnosis not present

## 2015-01-15 DIAGNOSIS — N920 Excessive and frequent menstruation with regular cycle: Secondary | ICD-10-CM | POA: Diagnosis present

## 2015-01-15 DIAGNOSIS — R0602 Shortness of breath: Secondary | ICD-10-CM

## 2015-01-15 DIAGNOSIS — I509 Heart failure, unspecified: Secondary | ICD-10-CM

## 2015-01-15 DIAGNOSIS — J449 Chronic obstructive pulmonary disease, unspecified: Secondary | ICD-10-CM | POA: Diagnosis present

## 2015-01-15 DIAGNOSIS — D869 Sarcoidosis, unspecified: Secondary | ICD-10-CM | POA: Diagnosis not present

## 2015-01-15 DIAGNOSIS — R05 Cough: Secondary | ICD-10-CM | POA: Diagnosis not present

## 2015-01-15 DIAGNOSIS — J9610 Chronic respiratory failure, unspecified whether with hypoxia or hypercapnia: Secondary | ICD-10-CM | POA: Diagnosis present

## 2015-01-15 DIAGNOSIS — G4733 Obstructive sleep apnea (adult) (pediatric): Secondary | ICD-10-CM | POA: Diagnosis present

## 2015-01-15 DIAGNOSIS — Z9119 Patient's noncompliance with other medical treatment and regimen: Secondary | ICD-10-CM | POA: Diagnosis present

## 2015-01-15 DIAGNOSIS — J9621 Acute and chronic respiratory failure with hypoxia: Secondary | ICD-10-CM | POA: Diagnosis not present

## 2015-01-15 DIAGNOSIS — Z79899 Other long term (current) drug therapy: Secondary | ICD-10-CM | POA: Diagnosis not present

## 2015-01-15 DIAGNOSIS — I471 Supraventricular tachycardia: Secondary | ICD-10-CM | POA: Diagnosis present

## 2015-01-15 DIAGNOSIS — E039 Hypothyroidism, unspecified: Secondary | ICD-10-CM | POA: Diagnosis present

## 2015-01-15 DIAGNOSIS — I5033 Acute on chronic diastolic (congestive) heart failure: Secondary | ICD-10-CM | POA: Diagnosis not present

## 2015-01-15 DIAGNOSIS — R791 Abnormal coagulation profile: Secondary | ICD-10-CM | POA: Diagnosis not present

## 2015-01-15 DIAGNOSIS — R7989 Other specified abnormal findings of blood chemistry: Secondary | ICD-10-CM

## 2015-01-15 DIAGNOSIS — J90 Pleural effusion, not elsewhere classified: Secondary | ICD-10-CM | POA: Diagnosis not present

## 2015-01-15 DIAGNOSIS — E038 Other specified hypothyroidism: Secondary | ICD-10-CM | POA: Diagnosis not present

## 2015-01-15 HISTORY — DX: Sarcoidosis, unspecified: D86.9

## 2015-01-15 LAB — IRON AND TIBC
Iron: <10 ug/dL — ABNORMAL LOW (ref 42–145)
UIBC: 324 ug/dL (ref 125–400)

## 2015-01-15 LAB — CBC
HEMATOCRIT: 31.8 % — AB (ref 36.0–46.0)
Hemoglobin: 8.2 g/dL — ABNORMAL LOW (ref 12.0–15.0)
MCH: 16 pg — AB (ref 26.0–34.0)
MCHC: 25.8 g/dL — AB (ref 30.0–36.0)
MCV: 62.1 fL — AB (ref 78.0–100.0)
Platelets: 319 10*3/uL (ref 150–400)
RBC: 5.12 MIL/uL — AB (ref 3.87–5.11)
RDW: 26.3 % — ABNORMAL HIGH (ref 11.5–15.5)
WBC: 11.4 10*3/uL — ABNORMAL HIGH (ref 4.0–10.5)

## 2015-01-15 LAB — BASIC METABOLIC PANEL
ANION GAP: 8 (ref 5–15)
Anion gap: 8 (ref 5–15)
BUN: 11 mg/dL (ref 6–23)
BUN: 9 mg/dL (ref 6–23)
CO2: 27 mmol/L (ref 19–32)
CO2: 28 mmol/L (ref 19–32)
Calcium: 8.6 mg/dL (ref 8.4–10.5)
Calcium: 8.9 mg/dL (ref 8.4–10.5)
Chloride: 99 mmol/L (ref 96–112)
Chloride: 98 mmol/L (ref 96–112)
Creatinine, Ser: 0.65 mg/dL (ref 0.50–1.10)
Creatinine, Ser: 0.64 mg/dL (ref 0.50–1.10)
GFR calc Af Amer: >90 mL/min (ref >90)
GFR calc Af Amer: >90 mL/min (ref >90)
GFR calc non Af Amer: >90 mL/min (ref >90)
Glucose, Bld: 137 mg/dL — ABNORMAL HIGH (ref 70–99)
Glucose, Bld: 105 mg/dL — ABNORMAL HIGH (ref 70–99)
Potassium: 4 mmol/L (ref 3.5–5.1)
Potassium: 4.1 mmol/L (ref 3.5–5.1)
SODIUM: 134 mmol/L — AB (ref 135–145)
SODIUM: 134 mmol/L — AB (ref 135–145)

## 2015-01-15 LAB — POC OCCULT BLOOD, ED: Fecal Occult Bld: NEGATIVE

## 2015-01-15 LAB — BRAIN NATRIURETIC PEPTIDE: B Natriuretic Peptide: 33.1 pg/mL (ref 0.0–100.0)

## 2015-01-15 LAB — CBC WITH DIFFERENTIAL/PLATELET
Basophils Absolute: 0 10*3/uL (ref 0.0–0.1)
Basophils Relative: 0 % (ref 0–1)
EOS ABS: 0.1 10*3/uL (ref 0.0–0.7)
Eosinophils Relative: 1 % (ref 0–5)
HCT: 27.6 % — ABNORMAL LOW (ref 36.0–46.0)
Hemoglobin: 6.7 g/dL — CL (ref 12.0–15.0)
Lymphocytes Relative: 7 % — ABNORMAL LOW (ref 12–46)
Lymphs Abs: 0.7 10*3/uL (ref 0.7–4.0)
MCH: 14.5 pg — ABNORMAL LOW (ref 26.0–34.0)
MCHC: 24.3 g/dL — ABNORMAL LOW (ref 30.0–36.0)
MCV: 59.9 fL — AB (ref 78.0–100.0)
MONO ABS: 0.6 10*3/uL (ref 0.1–1.0)
MONOS PCT: 6 % (ref 3–12)
NEUTROS PCT: 86 % — AB (ref 43–77)
Neutro Abs: 9.1 10*3/uL — ABNORMAL HIGH (ref 1.7–7.7)
PLATELETS: 269 10*3/uL (ref 150–400)
RBC: 4.61 MIL/uL (ref 3.87–5.11)
RDW: 24.5 % — AB (ref 11.5–15.5)
WBC: 10.5 10*3/uL (ref 4.0–10.5)

## 2015-01-15 LAB — D-DIMER, QUANTITATIVE: D-Dimer, Quant: 1.88 ug/mL-FEU — ABNORMAL HIGH (ref 0.00–0.48)

## 2015-01-15 LAB — PREPARE RBC (CROSSMATCH)

## 2015-01-15 LAB — RETICULOCYTES
RBC.: 4.87 MIL/uL (ref 3.87–5.11)
RETIC CT PCT: 1.6 % (ref 0.4–3.1)
Retic Count, Absolute: 77.9 10*3/uL (ref 19.0–186.0)

## 2015-01-15 LAB — VITAMIN B12: Vitamin B-12: 487 pg/mL (ref 211–911)

## 2015-01-15 LAB — TROPONIN I: Troponin I: <0.03 ng/mL (ref <0.031)

## 2015-01-15 LAB — FOLATE: Folate: 13.7 ng/mL

## 2015-01-15 LAB — ABO/RH: ABO/RH(D): A POS

## 2015-01-15 LAB — FERRITIN: FERRITIN: 10 ng/mL (ref 10–291)

## 2015-01-15 MED ORDER — IOHEXOL 350 MG/ML SOLN
125.0000 mL | Freq: Once | INTRAVENOUS | Status: AC | PRN
  Administered 2015-01-15: 125 mL via INTRAVENOUS

## 2015-01-15 MED ORDER — IPRATROPIUM-ALBUTEROL 0.5-2.5 (3) MG/3ML IN SOLN
3.0000 mL | Freq: Once | RESPIRATORY_TRACT | Status: AC
  Administered 2015-01-15: 3 mL via RESPIRATORY_TRACT
  Filled 2015-01-15: qty 3

## 2015-01-15 MED ORDER — ONDANSETRON HCL 4 MG PO TABS: 4.0000 mg | ORAL_TABLET | Freq: Four times a day (QID) | ORAL | Status: DC | PRN

## 2015-01-15 MED ORDER — ALUM & MAG HYDROXIDE-SIMETH 200-200-20 MG/5ML PO SUSP: 30.0000 mL | Freq: Four times a day (QID) | ORAL | Status: DC | PRN

## 2015-01-15 MED ORDER — ACETAMINOPHEN 650 MG RE SUPP: 650.0000 mg | Freq: Four times a day (QID) | RECTAL | Status: DC | PRN

## 2015-01-15 MED ORDER — HYDROMORPHONE HCL 1 MG/ML IJ SOLN
0.5000 mg | INTRAMUSCULAR | Status: DC | PRN
Start: 2015-01-15 — End: 2015-01-18

## 2015-01-15 MED ORDER — SODIUM CHLORIDE 0.9 % IV SOLN
250.0000 mL | INTRAVENOUS | Status: DC | PRN
  Administered 2015-01-16: 250 mL via INTRAVENOUS

## 2015-01-15 MED ORDER — SODIUM CHLORIDE 0.9 % IJ SOLN: 3.0000 mL | INTRAMUSCULAR | Status: DC | PRN

## 2015-01-15 MED ORDER — LEVOTHYROXINE SODIUM 100 MCG PO TABS
100.0000 ug | ORAL_TABLET | Freq: Every day | ORAL | Status: DC
  Administered 2015-01-15 – 2015-01-18 (×4): 100 ug via ORAL
  Filled 2015-01-15 (×5): qty 1

## 2015-01-15 MED ORDER — ACETAMINOPHEN 325 MG PO TABS: 650.0000 mg | ORAL_TABLET | Freq: Four times a day (QID) | ORAL | Status: DC | PRN

## 2015-01-15 MED ORDER — SODIUM CHLORIDE 0.9 % IV SOLN
Freq: Once | INTRAVENOUS | Status: AC
  Administered 2015-01-15: 08:00:00 via INTRAVENOUS

## 2015-01-15 MED ORDER — PREDNISONE 10 MG PO TABS
10.0000 mg | ORAL_TABLET | Freq: Every day | ORAL | Status: DC
  Administered 2015-01-15 – 2015-01-18 (×4): 10 mg via ORAL
  Filled 2015-01-15 (×4): qty 1

## 2015-01-15 MED ORDER — HYDROCHLOROTHIAZIDE 12.5 MG PO CAPS
12.5000 mg | ORAL_CAPSULE | Freq: Every day | ORAL | Status: DC
  Administered 2015-01-15 – 2015-01-17 (×3): 12.5 mg via ORAL
  Filled 2015-01-15 (×3): qty 1

## 2015-01-15 MED ORDER — PANTOPRAZOLE SODIUM 40 MG PO TBEC
40.0000 mg | DELAYED_RELEASE_TABLET | Freq: Every day | ORAL | Status: DC
  Administered 2015-01-15 – 2015-01-18 (×4): 40 mg via ORAL
  Filled 2015-01-15 (×4): qty 1

## 2015-01-15 MED ORDER — ONDANSETRON HCL 4 MG/2ML IJ SOLN: 4.0000 mg | Freq: Four times a day (QID) | INTRAMUSCULAR | Status: DC | PRN

## 2015-01-15 MED ORDER — LEVOFLOXACIN 750 MG PO TABS
750.0000 mg | ORAL_TABLET | Freq: Every day | ORAL | Status: DC
  Administered 2015-01-15 – 2015-01-18 (×4): 750 mg via ORAL
  Filled 2015-01-15 (×4): qty 1

## 2015-01-15 MED ORDER — LORATADINE 10 MG PO TABS
10.0000 mg | ORAL_TABLET | Freq: Every day | ORAL | Status: DC
  Administered 2015-01-16 – 2015-01-18 (×3): 10 mg via ORAL
  Filled 2015-01-15 (×4): qty 1

## 2015-01-15 MED ORDER — SODIUM CHLORIDE 0.9 % IJ SOLN
3.0000 mL | Freq: Two times a day (BID) | INTRAMUSCULAR | Status: DC
  Administered 2015-01-15 – 2015-01-18 (×6): 3 mL via INTRAVENOUS

## 2015-01-15 MED ORDER — OXYCODONE HCL 5 MG PO TABS: 5.0000 mg | ORAL_TABLET | ORAL | Status: DC | PRN

## 2015-01-15 MED ORDER — LOSARTAN POTASSIUM 50 MG PO TABS
50.0000 mg | ORAL_TABLET | Freq: Every day | ORAL | Status: DC
  Administered 2015-01-15 – 2015-01-18 (×4): 50 mg via ORAL
  Filled 2015-01-15 (×4): qty 1

## 2015-01-15 NOTE — ED Notes (Signed)
Pt tolerated 2nd blood transfusion well.

## 2015-01-15 NOTE — ED Notes (Signed)
Patient tolerating blood transfusion well.  No respiratory distress, lung sounds clear.  Patient A&O x4.

## 2015-01-15 NOTE — ED Provider Notes (Signed)
CSN: [id number]     Arrival date & time 01/15/15  0043 History   First MD Initiated Contact with Patient 01/15/15 0106     Chief Complaint  Patient presents with  . Respiratory Distress     (Consider location/radiation/quality/duration/timing/severity/associated sxs/prior Treatment) The history is provided by the patient.   49 year old female history of sarcoidosis states that she has had a cold for the last 3-4 weeks. This started when a family member came home with a cold. She has had yellowish rhinorrhea and cough productive of frothy to slightly yellowish sputum. Symptoms were generally stable until tonight when she had marked worsening of distant exertion. She walks with the bathroom and when she got back, she was extremely short of breath and did not recover the way she normally does. She had noted an increase in her cough over the last 3 days. She denies fever or chills or sweats. She has no change in chronic myalgias. She did increase her prednisone from 5 mg every other day to 10 mg every day. This was done 2 weeks ago and there was some modest improvement initially but symptoms have worsened. She did use her albuterol inhaler at home which did give some temporary relief from cough but did not improve her breathing. She denies chest pain, heaviness, tightness, pressure. She does state that she hasn't been able to leave the house because of her illness.  Past Medical History  Diagnosis Date  . Hypertension   . Hypothyroidism   . Anemia   . Morbid obesity   . Gallstones   . Seasonal allergies   . Chronic hyperventilation syndrome     w/ obesity tx with albuterol inhaler and oxygen 2L  . Shortness of breath   . Sleep apnea     uses CPAP machine   . GERD (gastroesophageal reflux disease)     diet controlled - no meds  . H/O hiatal hernia   . Arthritis     hands, shoulders, no meds  . Pneumonia     hoispitalized in 08/2011  . COPD (chronic obstructive pulmonary disease)     uses  oxygen 2 L  . Sarcoidosis    Past Surgical History  Procedure Laterality Date  . Cholecystectomy  2000  . Cesarean section  1994    x 1  . I and d of abcess  05/2011  . Hysteroscopy w/d&c  12/27/2011    Procedure: DILATATION AND CURETTAGE /HYSTEROSCOPY;  Surgeon: Maeola Sarah. Landry Mellow, MD;  Location: [facility];  Service: Gynecology;;  . Lung biopsy    . Uterine abletion     Family History  Problem Relation Age of Onset  . Diabetes Father   . Diabetes Brother   . Deep vein thrombosis Mother   . Aneurysm Sister     d/o brain aneurysm   History  Substance Use Topics  . Smoking status: Never Smoker   . Smokeless tobacco: Never Used  . Alcohol Use: Yes     Comment: occasionally   OB History    No data available     Review of Systems  All other systems reviewed and are negative.     Allergies  Review of patient's allergies indicates no known allergies.  Home Medications   Prior to Admission medications   Medication Sig Start Date End Date Taking? Authorizing Provider  albuterol (PROVENTIL HFA;VENTOLIN HFA) 108 (90 BASE) MCG/ACT inhaler Inhale 1-2 puffs into the lungs every 6 (six) hours as needed for wheezing. 12/14/13  Yes  Tanna Furry, MD  ferrous sulfate 325 (65 FE) MG tablet Take 1 tablet (325 mg total) by mouth 3 (three) times daily with meals. 12/13/13  Yes Timoteo Gaul, FNP  furosemide (LASIX) 40 MG tablet Take 1 tablet (40 mg total) by mouth daily as needed for edema. 08/29/14  Yes Tammy S [place], NP  levothyroxine (SYNTHROID, LEVOTHROID) 100 MCG tablet Take 1 tablet (100 mcg total) by mouth daily at 6 (six) AM. 09/16/13  Yes Timoteo Gaul, FNP  losartan-hydrochlorothiazide (HYZAAR) 50-12.5 MG per tablet Take 1 tablet by mouth every morning. 09/16/13  Yes Timoteo Gaul, FNP  predniSONE (DELTASONE) 10 MG tablet Take 1/2 tablet Monday, Wednesday and Friday Patient taking differently: Take 10 mg by mouth daily.  01/07/15  Yes Rigoberto Noel, MD  acetaminophen (TYLENOL)  325 MG tablet Take 650 mg by mouth every 6 (six) hours as needed for mild pain.    Historical Provider, MD  acetaminophen-codeine (TYLENOL #3) 300-30 MG per tablet Take 1 tablet by mouth every 8 (eight) hours as needed for moderate pain.    Historical Provider, MD  HYDROcodone-acetaminophen (NORCO) 10-325 MG per tablet Take 1 tablet by mouth every 6 (six) hours as needed.    Historical Provider, MD  meloxicam (MOBIC) 7.5 MG tablet Take 7.5 mg by mouth 3 (three) times daily.    Historical Provider, MD  methocarbamol (ROBAXIN) 500 MG tablet Take 500 mg by mouth every 6 (six) hours as needed for muscle spasms.    Historical Provider, MD  NON FORMULARY 2 liter of oxygen    Historical Provider, MD  predniSONE (DELTASONE) 5 MG tablet Take as directed Patient not taking: Reported on 01/15/2015 11/13/14   Rigoberto Noel, MD  triamcinolone acetonide (KENALOG) 40 MG/ML injection Inject 40 mg into the muscle every 3 (three) months. For knee pain    Historical Provider, MD   BP 184/63 mmHg  Pulse 117  Temp(Src) 98.7 F (37.1 C) (Oral)  Resp 40  SpO2 94% Physical Exam  Nursing note and vitals reviewed.  Morbidly obese 49 year old female, resting comfortably and in no acute distress. Vital signs are significant for hypertension, tachycardia, and tachypnea. Oxygen saturation is 94%, which is normal. Head is normocephalic and atraumatic. PERRLA, EOMI. Oropharynx is clear. Neck is nontender and supple without adenopathy or JVD. Back is nontender and there is no CVA tenderness. Lungs have distant tones with faint expiratory wheezes. There are no rales or rhonchi. Chest is nontender. Heart has regular rate and rhythm without murmur. Abdomen is soft, flat, nontender without masses or hepatosplenomegaly and peristalsis is normoactive. Extremities have 1+ edema, full range of motion is present. Skin is warm and dry without rash. Neurologic: Mental status is normal, cranial nerves are intact, there are no motor  or sensory deficits.  ED Course  Procedures (including critical care time) Labs Review Results for orders placed or performed during the hospital encounter of 61/44/31  Basic metabolic panel  Result Value Ref Range   Sodium 134 (L) 135 - 145 mmol/L   Potassium 4.0 3.5 - 5.1 mmol/L   Chloride 99 96 - 112 mmol/L   CO2 27 19 - 32 mmol/L   Glucose, Bld 137 (H) 70 - 99 mg/dL   BUN 11 6 - 23 mg/dL   Creatinine, Ser 0.65 0.50 - 1.10 mg/dL   Calcium 8.6 8.4 - 10.5 mg/dL   GFR calc non Af Amer >90 >90 mL/min   GFR calc Af Amer >90 >90  mL/min   Anion gap 8 5 - 15  Troponin I  Result Value Ref Range   Troponin I <0.03 <0.031 ng/mL  Brain natriuretic peptide  Result Value Ref Range   B Natriuretic Peptide 33.1 0.0 - 100.0 pg/mL  CBC with Differential  Result Value Ref Range   WBC 10.5 4.0 - 10.5 K/uL   RBC 4.61 3.87 - 5.11 MIL/uL   Hemoglobin 6.7 (LL) 12.0 - 15.0 g/dL   HCT 27.6 (L) 36.0 - 46.0 %   MCV 59.9 (L) 78.0 - 100.0 fL   MCH 14.5 (L) 26.0 - 34.0 pg   MCHC 24.3 (L) 30.0 - 36.0 g/dL   RDW 24.5 (H) 11.5 - 15.5 %   Platelets 269 150 - 400 K/uL   Neutrophils Relative % 86 (H) 43 - 77 %   Lymphocytes Relative 7 (L) 12 - 46 %   Monocytes Relative 6 3 - 12 %   Eosinophils Relative 1 0 - 5 %   Basophils Relative 0 0 - 1 %   Neutro Abs 9.1 (H) 1.7 - 7.7 K/uL   Lymphs Abs 0.7 0.7 - 4.0 K/uL   Monocytes Absolute 0.6 0.1 - 1.0 K/uL   Eosinophils Absolute 0.1 0.0 - 0.7 K/uL   Basophils Absolute 0.0 0.0 - 0.1 K/uL   RBC Morphology POLYCHROMASIA PRESENT   D-dimer, quantitative  Result Value Ref Range   D-Dimer, Quant 1.88 (H) 0.00 - 0.48 ug/mL-FEU  Reticulocytes  Result Value Ref Range   Retic Ct Pct 1.6 0.4 - 3.1 %   RBC. 4.87 3.87 - 5.11 MIL/uL   Retic Count, Manual 77.9 19.0 - 186.0 K/uL    Imaging Review Ct Angio Chest Pe W/cm &/or Wo Cm  01/15/2015   CLINICAL DATA:  Productive cough, dyspnea, elevated D-dimer  EXAM: CT ANGIOGRAPHY CHEST WITH CONTRAST  TECHNIQUE:  Multidetector CT imaging of the chest was performed using the standard protocol during bolus administration of intravenous contrast. Multiplanar CT image reconstructions and MIPs were obtained to evaluate the vascular anatomy.  CONTRAST:  125mL OMNIPAQUE IOHEXOL 350 MG/ML SOLN  COMPARISON:  Radiographs 01/15/2015  FINDINGS: The study is limited due to patient body habitus. No large or central pulmonary embolus is evident. Smaller emboli and peripheral emboli may not be apparent due to the study limitations. The thoracic aorta is normal in caliber and intact. There are scattered linear and ground-glass opacities in both lungs, predominantly central and basilar distribution. There is a small left pleural effusion. There is adenopathy in the mediastinum and both hila. There is a right peritracheal node measuring 2.5 cm. There are hilar nodes measuring up to 1.6 cm. The appearances are consistent with the described history of sarcoidosis. The central airways are patent. No significant abnormality is evident in the upper abdomen  Review of the MIP images confirms the above findings.  IMPRESSION: 1. Limited study due to patient body habitus. 2. No large or central pulmonary embolus 3. Scattered airspace opacities and small left effusion. This could represent congestive heart failure. Infectious or inflammatory pneumonitis is also possibility. 4. Moderately prominent mediastinal and hilar nodes, consistent with the described history of sarcoidosis.   Electronically Signed   By: Andreas Newport M.D.   On: 01/15/2015 05:15   Dg Chest Port 1 View  01/15/2015   CLINICAL DATA:  Cough and shortness of breath, subacute onset. Initial encounter.  EXAM: PORTABLE CHEST - 1 VIEW  COMPARISON:  Chest radiograph performed 12/14/2013, and CT of the chest performed  04/21/2014  FINDINGS: The lungs are hypoexpanded. Vascular congestion is noted. Patchy bilateral airspace opacities may reflect pulmonary edema or multifocal pneumonia. A  small left pleural effusion is noted. No pneumothorax is seen.  The cardiomediastinal silhouette is enlarged. No acute osseous abnormalities are seen.  IMPRESSION: Lungs hypoexpanded. Vascular congestion and cardiomegaly. Patchy bilateral airspace opacities may reflect pulmonary edema or multifocal pneumonia. Small left pleural effusion noted.   Electronically Signed   By: Garald Balding M.D.   On: 01/15/2015 02:08     EKG Interpretation   Date/Time:  Thursday January 15 2015 00:47:59 EDT Ventricular Rate:  121 PR Interval:  159 QRS Duration: 83 QT Interval:  299 QTC Calculation: 424 R Axis:   96 Text Interpretation:  Age not entered, assumed to be  49 years old for  purpose of ECG interpretation Sinus tachycardia Borderline right axis  deviation Minimal ST depression, inferior leads When compared with ECG of  12/14/2013, No significant change was found Confirmed by [facility]  MD, Caidence Kaseman  ([id number]) on 01/15/2015 1:11:25 AM      MDM   Final diagnoses:  Shortness of breath  Microcytic anemia  Elevated d-dimer    Respiratory tract infection with marked worsening of dyspnea tonight. Chest x-ray will be obtained to look for evidence of pneumonia. I'm also concerned about possibility of pulmonary embolism given the patient's relative immobility. D-dimer will be obtained. She will be given a trial of albuterol with ipratropium. Old records are reviewed confirming known history of sarcoidosis with several hospitalizations for same.  There was slight relief following albuterol with ipratropium. Chest x-ray showed some airspace disease which could be consistent with does CHF or pneumonia. Clinically, patient does not have pneumonia and antibiotics were not started for this reason. Hemoglobin has come back showing a significant drop from baseline. Patient admits that she is supposed to take iron but has been noncompliant. She does have heavy menses as a possible source of blood loss. I do suspect that her  anemia is sufficient to explain her respiratory difficulty. Anemia profile is obtained. I discussed with patient that she may need blood transfusion in the short-term. Case is discussed with Dr. Arnoldo Morale of [facility]. D-dimer is come back slightly elevated and she was sent for CT angiogram which is negative for pulmonary embolism.  Delora Fuel, MD 29/56/21 [id number]

## 2015-01-15 NOTE — ED Notes (Addendum)
Floor Unit RN/secretary was notified that pt needs a bariatric bed--- will call when bed is ready.

## 2015-01-15 NOTE — Progress Notes (Signed)
Patient admitted after midnight- please see H&P.   Will start IV levaquin instead of vanc/cefepime as no recent hospitalizations/exposure--no blood clot on CT scan- d/c lovenox. SCDs placed  Will need gyn follow up outpatient for menorrhagia  [name]

## 2015-01-15 NOTE — Progress Notes (Signed)
[facility] ED CM aware of CM consult to inform Cm pt is followed by [facility] personal care staff at home  This services will resume if pt d/c home Deferred until d/c date

## 2015-01-15 NOTE — ED Notes (Signed)
Patient tolerated blood transfusion well and ate breakfast.

## 2015-01-15 NOTE — ED Notes (Signed)
Bed: WA09 Expected date:  Expected time:  Means of arrival:  Comments: EMS 

## 2015-01-15 NOTE — Progress Notes (Signed)
UR completed 

## 2015-01-15 NOTE — H&P (Signed)
[facility] Admission History and Physical       [name] JOI:[id number] DOB: 19-May-1966 DOA: 01/15/2015  Referring physician:  PCP: Donia Ast, FNP  Specialists:   Chief Complaint:  DHPI: [name] is a 49 y.o. female with a history of Sarcoid, Chronic Diastolic CHF HTN, Hypothyroid, Obesity-Hypoventilation Syndrome, OSA who was brought to the ED with complaints of worsening SOB over the past 2 days.  She reports that she also had fatigue and increased weakness.   She also has  had a Upper Respiratory Infections for the past 2 -3 weeks.  She denies any nausea or vomting, or Diarrhea.  Cutter she was foundto have a Hb =6.7 and she was referred for admission.    Review of Systems:  Constitutional: No Weight Loss, No Weight Gain, Night Sweats, Fevers, Chills, Dizziness, Light Headedness, +Fatigue,+ Generalized Weakness HEENT: No Headaches, Difficulty Swallowing,Tooth/Dental Problems,Sore Throat,  No Sneezing, Rhinitis, Ear Ache, Nasal Congestion, or Post Nasal Drip,  Cardio-vascular:  No Chest pain, Orthopnea, PND, Edema in Lower Extremities, Anasarca, Dizziness, Palpitations  Resp:   +Dyspnea, No DOE, +Productive Cough, No Non-Productive Cough, No Hemoptysis, No Wheezing.    GI: No Heartburn, Indigestion, Abdominal Pain, Nausea, Vomiting, Diarrhea, Constipation, Hematemesis, Hematochezia, Melena, Change in Bowel Habits,  Loss of Appetite  GU: No Dysuria, No Change in Color of Urine, No Urgency or Urinary Frequency, No Flank pain.  Musculoskeletal: No Joint Pain or Swelling, No Decreased Range of Motion, No Back Pain.  Neurologic: No Syncope, No Seizures, Muscle Weakness, Paresthesia, Vision Disturbance or Loss, No Diplopia, No Vertigo, No Difficulty Walking,  Skin: No Rash or Lesions. Psych: No Change in Mood or Affect, No Depression or Anxiety, No Memory loss, No Confusion, or Hallucinations   Past Medical History  Diagnosis Date  .  Hypertension   . Hypothyroidism   . Anemia   . Morbid obesity   . Gallstones   . Seasonal allergies   . Chronic hyperventilation syndrome     w/ obesity tx with albuterol inhaler and oxygen 2L  . Shortness of breath   . Sleep apnea     uses CPAP machine   . GERD (gastroesophageal reflux disease)     diet controlled - no meds  . H/O hiatal hernia   . Arthritis     hands, shoulders, no meds  . Pneumonia     hoispitalized in 08/2011  . COPD (chronic obstructive pulmonary disease)     uses oxygen 2 L  . Sarcoidosis      Past Surgical History  Procedure Laterality Date  . Cholecystectomy  2000  . Cesarean section  1994    x 1  . I and d of abcess  05/2011  . Hysteroscopy w/d&c  12/27/2011    Procedure: DILATATION AND CURETTAGE /HYSTEROSCOPY;  Surgeon: Maeola Sarah. Landry Mellow, MD;  Location: [facility];  Service: Gynecology;;  . Lung biopsy    . Uterine abletion        Prior to Admission medications   Medication Sig Start Date End Date Taking? Authorizing Provider  albuterol (PROVENTIL HFA;VENTOLIN HFA) 108 (90 BASE) MCG/ACT inhaler Inhale 1-2 puffs into the lungs every 6 (six) hours as needed for wheezing. 12/14/13  Yes Tanna Furry, MD  cetirizine (ZYRTEC) 10 MG tablet Take 10 mg by mouth at bedtime as needed for allergies.   Yes Historical Provider, MD  ferrous sulfate 325 (65 FE) MG tablet Take 1 tablet (325 mg total) by mouth 3 (  three) times daily with meals. 12/13/13  Yes Timoteo Gaul, FNP  furosemide (LASIX) 40 MG tablet Take 1 tablet (40 mg total) by mouth daily as needed for edema. 08/29/14  Yes Tammy S Parrett, NP  HYDROcodone-acetaminophen (NORCO/VICODIN) 5-325 MG per tablet Take 1 tablet by mouth every 6 (six) hours as needed for moderate pain.   Yes Historical Provider, MD  levothyroxine (SYNTHROID, LEVOTHROID) 100 MCG tablet Take 1 tablet (100 mcg total) by mouth daily at 6 (six) AM. 09/16/13  Yes Timoteo Gaul, FNP  losartan-hydrochlorothiazide (HYZAAR) 50-12.5 MG per  tablet Take 1 tablet by mouth every morning. 09/16/13  Yes Timoteo Gaul, FNP  meloxicam (MOBIC) 7.5 MG tablet Take 7.5 mg by mouth 3 (three) times daily.   Yes Historical Provider, MD  methocarbamol (ROBAXIN) 500 MG tablet Take 500 mg by mouth every 6 (six) hours as needed for muscle spasms.   Yes Historical Provider, MD  predniSONE (DELTASONE) 10 MG tablet Take 1/2 tablet Monday, Wednesday and Friday Patient taking differently: Take 10 mg by mouth daily.  01/07/15  Yes Rigoberto Noel, MD  NON FORMULARY 2 liter of oxygen    Historical Provider, MD  predniSONE (DELTASONE) 5 MG tablet Take as directed Patient not taking: Reported on 01/15/2015 11/13/14   Rigoberto Noel, MD  triamcinolone acetonide (KENALOG) 40 MG/ML injection Inject 40 mg into the muscle every 3 (three) months. For knee pain    Historical Provider, MD     No Known Allergies  Social History:  reports that she has never smoked. She has never used smokeless tobacco. She reports that she drinks alcohol. She reports that she does not use illicit drugs.    Family History  Problem Relation Age of Onset  . Diabetes Father   . Diabetes Brother   . Deep vein thrombosis Mother   . Aneurysm Sister     d/o brain aneurysm       Physical Exam:  GEN:  Pleasant Morbidly Obese 49 y.o. African American female examined and in no acute distress; cooperative with exam Filed Vitals:   01/15/15 0048 01/15/15 0156 01/15/15 0342 01/15/15 0354  BP: 184/63  139/60   Pulse: 117  102   Temp: 98.7 F (37.1 C)  99 F (37.2 C)   TempSrc: Oral  Oral   Resp: 40  22   Weight:    211.83 kg (467 lb)  SpO2: 94% 100% 99%    Blood pressure 139/60, pulse 102, temperature 99 F (37.2 C), temperature source Oral, resp. rate 22, weight 211.83 kg (467 lb), last menstrual period 01/14/2015, SpO2 99 %. PSYCH: She is alert and oriented x4; does not appear anxious does not appear depressed; affect is normal HEENT: Normocephalic and Atraumatic, Mucous  membranes pink; PERRLA; EOM intact; Fundi:  Benign;  No scleral icterus, Nares: Patent, Oropharynx: Clear, Fair Dentition,    Neck:  FROM, No Cervical Lymphadenopathy nor Thyromegaly or Carotid Bruit; No JVD; Breasts:: Not examined CHEST WALL: No tenderness CHEST: Normal respiration, clear to auscultation bilaterally HEART: Regular rate and rhythm; no murmurs rubs or gallops BACK: No kyphosis or scoliosis; No CVA tenderness ABDOMEN: Positive Bowel Sounds, Obese, Soft Non-Tender, No Rebound or Guarding; No Masses, No Organomegaly, +Pannus;. Rectal Exam: Not done EXTREMITIES: No Cyanosis, Clubbing, or Edema; No Ulcerations. Genitalia: not examined PULSES: 2+ and symmetric SKIN: Normal hydration no rash or ulceration CNS:  Alert and Oriented x 4, No Focal Deficits Vascular: pulses palpable throughout    Labs  on Admission:  Basic Metabolic Panel:  Recent Labs Lab 01/15/15 0146  NA 134*  K 4.0  CL 99  CO2 27  GLUCOSE 137*  BUN 11  CREATININE 0.65  CALCIUM 8.6   Liver Function Tests: No results for input(s): AST, ALT, ALKPHOS, BILITOT, PROT, ALBUMIN in the last 168 hours. No results for input(s): LIPASE, AMYLASE in the last 168 hours. No results for input(s): AMMONIA in the last 168 hours. CBC:  Recent Labs Lab 01/15/15 0146  WBC 10.5  NEUTROABS 9.1*  HGB 6.7*  HCT 27.6*  MCV 59.9*  PLT 269   Cardiac Enzymes:  Recent Labs Lab 01/15/15 0146  TROPONINI <0.03    BNP (last 3 results)  Recent Labs  01/15/15 0148  BNP 33.1    ProBNP (last 3 results) No results for input(s): PROBNP in the last 8760 hours.  CBG: No results for input(s): GLUCAP in the last 168 hours.  Radiological Exams on Admission: Dg Chest Port 1 View  01/15/2015   CLINICAL DATA:  Cough and shortness of breath, subacute onset. Initial encounter.  EXAM: PORTABLE CHEST - 1 VIEW  COMPARISON:  Chest radiograph performed 12/14/2013, and CT of the chest performed 04/21/2014  FINDINGS: The lungs  are hypoexpanded. Vascular congestion is noted. Patchy bilateral airspace opacities may reflect pulmonary edema or multifocal pneumonia. A small left pleural effusion is noted. No pneumothorax is seen.  The cardiomediastinal silhouette is enlarged. No acute osseous abnormalities are seen.  IMPRESSION: Lungs hypoexpanded. Vascular congestion and cardiomegaly. Patchy bilateral airspace opacities may reflect pulmonary edema or multifocal pneumonia. Small left pleural effusion noted.   Electronically Signed   By: Garald Balding M.D.   On: 01/15/2015 02:08     EKG: Independently reviewed.    Assessment/Plan:   49 y.o. female with  Principal Problem:   1.    Symptomatic anemia- due to Menorrhagia   Transfuse 2 units   Monitor H/Hs   Active Problems:   2.   Anemia due to chronic blood loss- due to Menorrhagia   Refer to Gyn for Further Evaluation and Treatment     3.   HCAP (healthcare-associated pneumonia)   IV Vancomycin and Cefepime   DuoNebs   NCO2     4.   Chronic respiratory failure   Continue NCO2 at 2 liters titrate PRN     5.   Hypertension   On Losartan/ HCTZ   Monitor BPs     6.   Hypothyroidism   Continue Levothyroxine     7.   Sarcoidosis   On Prednisone 10 mg daily     8.   OSA (obstructive sleep apnea)   On NCO2 Conitnously        9.   Obesity hypoventilation syndrome   On continuous NCO2   10.   Morbid obesity   Defer to the PCP for outpatient Options.           11. DVT Prophylaxis   Full Dose Lovenox.            Code Status:     FULL CODE        Family Communication:   No Family Present    Disposition Plan:    Inpatient  Status        Time spent:  [facility] Pager [phone]   If [place] Please Contact the Day Rounding Team MD for [facility]  If 7PM-7AM, Please Contact  Night-Floor Coverage  [email or web address] Password [email or web address] 01/15/2015, 4:21 AM     ADDENDUM:   Patient was seen and  examined on 01/15/2015

## 2015-01-15 NOTE — ED Notes (Signed)
Per EMS pt from home, has had cold x 3-4 weeks ago, has developed a cough w/ yellow sputum and shortness of breath, hx of sarcoidosis, last time she got like this she was hospitalized x 8 days,Pt is on 2L [place] at home, currently on 4L  96%

## 2015-01-16 DIAGNOSIS — R0600 Dyspnea, unspecified: Secondary | ICD-10-CM

## 2015-01-16 DIAGNOSIS — D500 Iron deficiency anemia secondary to blood loss (chronic): Secondary | ICD-10-CM

## 2015-01-16 DIAGNOSIS — J9610 Chronic respiratory failure, unspecified whether with hypoxia or hypercapnia: Secondary | ICD-10-CM

## 2015-01-16 LAB — TYPE AND SCREEN
ABO/RH(D): A POS
Antibody Screen: NEGATIVE
UNIT DIVISION: 0
Unit division: 0

## 2015-01-16 MED ORDER — SODIUM CHLORIDE 0.9 % IV SOLN
510.0000 mg | Freq: Once | INTRAVENOUS | Status: AC
  Administered 2015-01-16: 510 mg via INTRAVENOUS
  Filled 2015-01-16: qty 17

## 2015-01-16 MED ORDER — FUROSEMIDE 10 MG/ML IJ SOLN
40.0000 mg | Freq: Two times a day (BID) | INTRAMUSCULAR | Status: DC
  Administered 2015-01-16 – 2015-01-18 (×4): 40 mg via INTRAVENOUS
  Filled 2015-01-16 (×4): qty 4

## 2015-01-16 MED ORDER — ENOXAPARIN SODIUM 40 MG/0.4ML ~~LOC~~ SOLN
40.0000 mg | SUBCUTANEOUS | Status: DC
  Administered 2015-01-16 – 2015-01-18 (×3): 40 mg via SUBCUTANEOUS
  Filled 2015-01-16 (×3): qty 0.4

## 2015-01-16 NOTE — Progress Notes (Signed)
  Echocardiogram 2D Echocardiogram has been performed.  Darlina Sicilian M 01/16/2015, 4:01 PM

## 2015-01-16 NOTE — Progress Notes (Signed)
[facility] PROGRESS NOTE  [name] OAC:[id number] DOB: [birth year] DOA: 01/15/2015 PCP: Donia Ast, FNP  Assessment/Plan: 1. Severe Iron defi anemia -due to menstrual blood loss, has seen Gyn for this has fibroids and reportedly some atypical cells that she has to have further workup for -s/p 2units PRBC -will give IV Iron -needs to be compliant with Iron at DC and FU with GYN  2. Acute on chornic likely diastolic CHF -IV lasix 40mg  q12 now -check ECHO, follow weights and I/Os  3. Possible CAP -doubt this, continue levaquin fro now   4. Chronic respiratory failure -due to OSA/OHS   -Continue NCO2 at 2 liters   5. Hypertension On Losartan/ HCTZ Monitor BPs   6. Hypothyroidism Continue Levothyroxine   7. Sarcoidosis On Prednisone 10 mg daily  DVt proph: lovenox    Code Status: Full Code Family Communication: none at bedside Disposition Plan: home when stable   HPI/Subjective: Breathing imptoving after blood   Objective: Filed Vitals:   01/16/15 0516  BP: 138/79  Pulse: 88  Temp: 98 F (36.7 C)  Resp: 22    Intake/Output Summary (Last 24 hours) at 01/16/15 0819 Last data filed at 01/16/15 0516  Gross per 24 hour  Intake    500 ml  Output    650 ml  Net   -150 ml   Filed Weights   01/15/15 0354 01/15/15 1710  Weight: 211.83 kg (467 lb) 210.242 kg (463 lb 8 oz)    Exam:   General:  AOx3, no distress, morbidly obese  Cardiovascular: S1S2/RRR  Respiratory: diminished BS at bases  Abdomen: soft, obese, NT, BS present  Musculoskeletal: 1plus edema  Data Reviewed: Basic Metabolic Panel:  Recent Labs Lab 01/15/15 0146 01/15/15 1835  NA 134* 134*  K 4.0 4.1  CL 99 98  CO2 27 28  GLUCOSE 137* 105*  BUN 11 9  CREATININE 0.65 0.64  CALCIUM 8.6 8.9   Liver Function Tests: No  results for input(s): AST, ALT, ALKPHOS, BILITOT, PROT, ALBUMIN in the last 168 hours. No results for input(s): LIPASE, AMYLASE in the last 168 hours. No results for input(s): AMMONIA in the last 168 hours. CBC:  Recent Labs Lab 01/15/15 0146 01/15/15 1835  WBC 10.5 11.4*  NEUTROABS 9.1*  --   HGB 6.7* 8.2*  HCT 27.6* 31.8*  MCV 59.9* 62.1*  PLT 269 319   Cardiac Enzymes:  Recent Labs Lab 01/15/15 0146  TROPONINI <0.03   BNP (last 3 results)  Recent Labs  01/15/15 0148  BNP 33.1    ProBNP (last 3 results) No results for input(s): PROBNP in the last 8760 hours.  CBG: No results for input(s): GLUCAP in the last 168 hours.  No results found for this or any previous visit (from the past 240 hour(s)).   Studies: Ct Angio Chest Pe W/cm &/or Wo Cm  01/15/2015   CLINICAL DATA:  Productive cough, dyspnea, elevated D-dimer  EXAM: CT ANGIOGRAPHY CHEST WITH CONTRAST  TECHNIQUE: Multidetector CT imaging of the chest was performed using the standard protocol during bolus administration of intravenous contrast. Multiplanar CT image reconstructions and MIPs were obtained to evaluate the vascular anatomy.  CONTRAST:  193mL OMNIPAQUE IOHEXOL 350 MG/ML SOLN  COMPARISON:  Radiographs 01/15/2015  FINDINGS: The study is limited due to patient body habitus. No large or central pulmonary embolus is evident. Smaller emboli and peripheral emboli may not be apparent due to the study limitations. The thoracic aorta is normal in caliber and  intact. There are scattered linear and ground-glass opacities in both lungs, predominantly central and basilar distribution. There is a small left pleural effusion. There is adenopathy in the mediastinum and both hila. There is a right peritracheal node measuring 2.5 cm. There are hilar nodes measuring up to 1.6 cm. The appearances are consistent with the described history of sarcoidosis. The central airways are patent. No significant abnormality is evident in the  upper abdomen  Review of the MIP images confirms the above findings.  IMPRESSION: 1. Limited study due to patient body habitus. 2. No large or central pulmonary embolus 3. Scattered airspace opacities and small left effusion. This could represent congestive heart failure. Infectious or inflammatory pneumonitis is also possibility. 4. Moderately prominent mediastinal and hilar nodes, consistent with the described history of sarcoidosis.   Electronically Signed   By: Andreas Newport M.D.   On: 01/15/2015 05:15   Dg Chest Port 1 View  01/15/2015   CLINICAL DATA:  Cough and shortness of breath, subacute onset. Initial encounter.  EXAM: PORTABLE CHEST - 1 VIEW  COMPARISON:  Chest radiograph performed 12/14/2013, and CT of the chest performed 04/21/2014  FINDINGS: The lungs are hypoexpanded. Vascular congestion is noted. Patchy bilateral airspace opacities may reflect pulmonary edema or multifocal pneumonia. A small left pleural effusion is noted. No pneumothorax is seen.  The cardiomediastinal silhouette is enlarged. No acute osseous abnormalities are seen.  IMPRESSION: Lungs hypoexpanded. Vascular congestion and cardiomegaly. Patchy bilateral airspace opacities may reflect pulmonary edema or multifocal pneumonia. Small left pleural effusion noted.   Electronically Signed   By: Garald Balding M.D.   On: 01/15/2015 02:08    Scheduled Meds: . losartan  50 mg Oral Daily   And  . hydrochlorothiazide  12.5 mg Oral Daily  . levofloxacin  750 mg Oral Daily  . levothyroxine  100 mcg Oral QAC breakfast  . loratadine  10 mg Oral Daily  . pantoprazole  40 mg Oral Daily  . predniSONE  10 mg Oral Q breakfast  . sodium chloride  3 mL Intravenous Q12H   Continuous Infusions:  Antibiotics Given (last 72 hours)    Date/Time Action Medication Dose   01/15/15 1313 Given   levofloxacin (LEVAQUIN) tablet 750 mg 750 mg      Principal Problem:   Symptomatic anemia Active Problems:   OSA (obstructive sleep apnea)    Obesity hypoventilation syndrome   Hypertension   Sarcoidosis   Anemia due to chronic blood loss   HCAP (healthcare-associated pneumonia)   Chronic respiratory failure   Hypothyroidism   Morbid obesity    Time spent: 62min    [name]  [facility] Pager [phone]. If 7PM-7AM, please contact night-coverage at [email or web address], password [facility] 01/16/2015, 8:19 AM  LOS: 1 day

## 2015-01-17 DIAGNOSIS — D869 Sarcoidosis, unspecified: Secondary | ICD-10-CM

## 2015-01-17 DIAGNOSIS — J9621 Acute and chronic respiratory failure with hypoxia: Secondary | ICD-10-CM

## 2015-01-17 DIAGNOSIS — G4733 Obstructive sleep apnea (adult) (pediatric): Secondary | ICD-10-CM

## 2015-01-17 DIAGNOSIS — J9620 Acute and chronic respiratory failure, unspecified whether with hypoxia or hypercapnia: Secondary | ICD-10-CM

## 2015-01-17 LAB — BASIC METABOLIC PANEL
Anion gap: 14 (ref 5–15)
BUN: 15 mg/dL (ref 6–23)
CALCIUM: 9.1 mg/dL (ref 8.4–10.5)
CHLORIDE: 95 mmol/L — AB (ref 96–112)
CO2: 31 mmol/L (ref 19–32)
Creatinine, Ser: 0.76 mg/dL (ref 0.50–1.10)
GFR calc non Af Amer: >90 mL/min (ref >90)
GLUCOSE: 83 mg/dL (ref 70–99)
Potassium: 3.7 mmol/L (ref 3.5–5.1)
Sodium: 140 mmol/L (ref 135–145)

## 2015-01-17 LAB — PROCALCITONIN: Procalcitonin: <0.1 ng/mL

## 2015-01-17 LAB — CBC
HCT: 30.4 % — ABNORMAL LOW (ref 36.0–46.0)
Hemoglobin: 7.7 g/dL — ABNORMAL LOW (ref 12.0–15.0)
MCH: 15.9 pg — ABNORMAL LOW (ref 26.0–34.0)
MCHC: 25.3 g/dL — ABNORMAL LOW (ref 30.0–36.0)
MCV: 62.7 fL — AB (ref 78.0–100.0)
Platelets: 320 10*3/uL (ref 150–400)
RBC: 4.85 MIL/uL (ref 3.87–5.11)
RDW: 26.7 % — AB (ref 11.5–15.5)
WBC: 8.9 10*3/uL (ref 4.0–10.5)

## 2015-01-17 LAB — MAGNESIUM: Magnesium: 1.7 mg/dL (ref 1.5–2.5)

## 2015-01-17 MED ORDER — METOPROLOL SUCCINATE ER 25 MG PO TB24
25.0000 mg | ORAL_TABLET | Freq: Every day | ORAL | Status: DC
  Administered 2015-01-17 – 2015-01-18 (×2): 25 mg via ORAL
  Filled 2015-01-17 (×2): qty 1

## 2015-01-17 MED ORDER — CARVEDILOL 3.125 MG PO TABS
3.1250 mg | ORAL_TABLET | Freq: Two times a day (BID) | ORAL | Status: DC
Start: 2015-01-17 — End: 2015-01-17

## 2015-01-17 NOTE — Evaluation (Addendum)
Physical Therapy Evaluation Patient Details Name: [name] MRN: [id number] DOB: January 03, 1966 Today's Date: 01/17/2015   History of Present Illness  [name] is a 49 y.o. female with a history of Sarcoid, Chronic Diastolic CHF HTN, Hypothyroid, Obesity-Hypoventilation Syndrome, OSA who was brought to the ED with complaints of worsening SOB. Dx of anemia, acute on chronic CHF, respiratory failure.    Clinical Impression  Pt admitted with above diagnosis. Pt currently with functional limitations due to the deficits listed below (see PT Problem List). Pt walked 24' without assistive device, SaO2 dropped to 79% on 2L O2, HR 117. Distance limited by 3/4 dyspnea. Encouraged pt to performed seated ankle pumps and knee extension AROM to help prevent deconditioning.  Pt will benefit from skilled PT to increase their independence and safety with mobility to allow discharge to the venue listed below.   *    Follow Up Recommendations Home health PT    Equipment Recommendations  None recommended by PT    Recommendations for Other Services OT consult     Precautions / Restrictions Precautions Precautions: Fall Precaution Comments: monitor O2 Restrictions Weight Bearing Restrictions: No      Mobility  Bed Mobility               General bed mobility comments: NT-pt up in room  Transfers Overall transfer level: Independent                  Ambulation/Gait Ambulation/Gait assistance: Independent Ambulation Distance (Feet): 24 Feet Assistive device: None Gait Pattern/deviations: Decreased stride length   Gait velocity interpretation: Below normal speed for age/gender General Gait Details: SaO2 79% on 2L O2 [place] walking, HR 117, 3/4 dyspnea  Stairs            Wheelchair Mobility    Modified Rankin (Stroke Patients Only)       Balance Overall balance assessment: No apparent balance deficits (not formally assessed)                                            Pertinent Vitals/Pain Pain Assessment: No/denies pain    Home Living Family/patient expects to be discharged to:: Private residence Living Arrangements: Children ([place]) Available Help at Discharge: Family;Available PRN/intermittently   Home Access: Level entry     Home Layout: One level Home Equipment: Walker - 2 wheels (home O2) Additional Comments: uses 2L O2 24* at baseline, walks without AD at baseline, has aide for bathing/dressing    Prior Function Level of Independence: Needs assistance   Gait / Transfers Assistance Needed: walks without AD  ADL's / Homemaking Assistance Needed: assist from aide        Hand Dominance        Extremity/Trunk Assessment   Upper Extremity Assessment: Overall WFL for tasks assessed           Lower Extremity Assessment: Overall WFL for tasks assessed      Cervical / Trunk Assessment: Normal  Communication   Communication: No difficulties  Cognition Arousal/Alertness: Awake/alert Behavior During Therapy: WFL for tasks assessed/performed Overall Cognitive Status: Within Functional Limits for tasks assessed                      General Comments      Exercises        Assessment/Plan    PT  Assessment Patient needs continued PT services  PT Diagnosis Generalized weakness;Difficulty walking   PT Problem List Cardiopulmonary status limiting activity;Decreased activity tolerance  PT Treatment Interventions Gait training;Functional mobility training;Therapeutic activities;Patient/family education;Therapeutic exercise   PT Goals (Current goals can be found in the Care Plan section) Acute Rehab PT Goals Patient Stated Goal: to walk without SOB PT Goal Formulation: With patient Time For Goal Achievement: 01/31/15 Potential to Achieve Goals: Good    Frequency Min 3X/week   Barriers to discharge        Co-evaluation               End of Session  Equipment Utilized During Treatment: Oxygen Activity Tolerance: Treatment limited secondary to medical complications (Comment) (dyspnea, hypoxia) Patient left: in chair Nurse Communication: Mobility status         Time: 9509-3267 PT Time Calculation (min) (ACUTE ONLY): 11 min   Charges:   PT Evaluation $Initial PT Evaluation Tier I: 1 Procedure     PT G Codes:        Philomena Doheny 01/17/2015, 8:27 AM [phone]

## 2015-01-17 NOTE — Progress Notes (Signed)
PROGRESS NOTE  [name] ACZ:[id number] DOB: 1966-10-14 DOA: 01/15/2015 PCP: Donia Ast, FNP  Brief history 49 year old female with a history of chronic respiratory failure, sarcoidosis, hypertension, OSA presented with two-day history of worsening shortness of breath and fatigue. The patient states that she actually had shortness of breath and URI type symptoms for approximately one week prior to admission. She denied any fevers or chills but had cough with pink tinged sputum. She denied any nausea, vomiting, diarrhea, abdominal pain, dysuria. At the time of admission, CT angiogram of the chest was performed and was negative for pulmonary embolus revealed scattered groundglass opacities in the central and basilar regions as well as a small left effusion and hilar and mediastinal lymphadenopathy. The patient was initially started on levofloxacin at the time of admission. Because of slow clinical improvement, the patient was started on furosemide IV on 01/16/2015. At baseline, the patient takes prednisone 10 mg every other day. Assessment/Plan: Acute on chronic respiratory failure -now back on 2L -secondary to CHF and PNA superimposed upon underlying OSA/OHS, sarcoidosis, and anemia -chronically on 2L at home Acute on chronic diastolic CHF -Difficult to obtain weights due to the patient's body habitus -Difficult to assess clinical response due to the patient's body habitus although she appears to be hypervolemic -Continue intravenous furosemide -Urine output 1400 cc past 24 hours -start metoprolol succinate -Discontinued HCTZ Community acquired pneumonia -Continue levofloxacin -check procalcitonin Iron deficiency anemia -FOBT negative -Iron saturation less than 10% -Patient received a dose of Feraheme on 3/18 -continue oral iron -outpt GI followup -baseline Hgb 8 SVT -?sinus with PAC vs atrial tachycardia--<5 seconds -optimize electrolytes -start  metoprolol succinate Hypertension -Continue losartan -Hold HCTZ as the patient continues on intravenous furosemide Hypothyroidism -Continue Synthroid -TSH Sarcoidosis -Continue maintenance prednisone Morbid Obesity -BMI 75   Family Communication:   Pt at beside Disposition Plan:   Home 1-2 days       Procedures/Studies: Ct Angio Chest Pe W/cm &/or Wo Cm  01/15/2015   CLINICAL DATA:  Productive cough, dyspnea, elevated D-dimer  EXAM: CT ANGIOGRAPHY CHEST WITH CONTRAST  TECHNIQUE: Multidetector CT imaging of the chest was performed using the standard protocol during bolus administration of intravenous contrast. Multiplanar CT image reconstructions and MIPs were obtained to evaluate the vascular anatomy.  CONTRAST:  122mL OMNIPAQUE IOHEXOL 350 MG/ML SOLN  COMPARISON:  Radiographs 01/15/2015  FINDINGS: The study is limited due to patient body habitus. No large or central pulmonary embolus is evident. Smaller emboli and peripheral emboli may not be apparent due to the study limitations. The thoracic aorta is normal in caliber and intact. There are scattered linear and ground-glass opacities in both lungs, predominantly central and basilar distribution. There is a small left pleural effusion. There is adenopathy in the mediastinum and both hila. There is a right peritracheal node measuring 2.5 cm. There are hilar nodes measuring up to 1.6 cm. The appearances are consistent with the described history of sarcoidosis. The central airways are patent. No significant abnormality is evident in the upper abdomen  Review of the MIP images confirms the above findings.  IMPRESSION: 1. Limited study due to patient body habitus. 2. No large or central pulmonary embolus 3. Scattered airspace opacities and small left effusion. This could represent congestive heart failure. Infectious or inflammatory pneumonitis is also possibility. 4. Moderately prominent mediastinal and hilar nodes, consistent with the described  history of sarcoidosis.   Electronically Signed   By: Quillian Quince  Armandina Stammer M.D.   On: 01/15/2015 05:15   Dg Chest Port 1 View  01/15/2015   CLINICAL DATA:  Cough and shortness of breath, subacute onset. Initial encounter.  EXAM: PORTABLE CHEST - 1 VIEW  COMPARISON:  Chest radiograph performed 12/14/2013, and CT of the chest performed 04/21/2014  FINDINGS: The lungs are hypoexpanded. Vascular congestion is noted. Patchy bilateral airspace opacities may reflect pulmonary edema or multifocal pneumonia. A small left pleural effusion is noted. No pneumothorax is seen.  The cardiomediastinal silhouette is enlarged. No acute osseous abnormalities are seen.  IMPRESSION: Lungs hypoexpanded. Vascular congestion and cardiomegaly. Patchy bilateral airspace opacities may reflect pulmonary edema or multifocal pneumonia. Small left pleural effusion noted.   Electronically Signed   By: Garald Balding M.D.   On: 01/15/2015 02:08         Subjective: Patient denies any fevers, chills, chest pain, nausea, vomiting, diarrhea, vomiting, dysuria. She is breathing somewhat better, proximally 40-50%. Coughing has improved.  Objective: Filed Vitals:   01/17/15 0544 01/17/15 0744 01/17/15 0822 01/17/15 0943  BP: 127/61   138/67  Pulse: 88 98 117 95  Temp: 98.3 F (36.8 C)   98.1 F (36.7 C)  TempSrc: Oral   Oral  Resp: 22   20  Height:      Weight:      SpO2: 98%  79% 95%    Intake/Output Summary (Last 24 hours) at 01/17/15 1214 Last data filed at 01/17/15 5465  Gross per 24 hour  Intake  572.5 ml  Output   1400 ml  Net -827.5 ml   Weight change:  Exam:   General:  Pt is alert, follows commands appropriately, not in acute distress  HEENT: No icterus, No thrush, No neck mass, /AT  Cardiovascular: RRR, S1/S2, no rubs, no gallops  Respiratory: Left greater than right basilar crackles. No wheezes. Good air movement.  Abdomen: Soft/+BS, non tender, non distended, no guarding  Extremities: 2+LE  edema, No lymphangitis, No petechiae, No rashes, no synovitis  Data Reviewed: Basic Metabolic Panel:  Recent Labs Lab 01/15/15 0146 01/15/15 1835 01/17/15 0541 01/17/15 0750  NA 134* 134* 140  --   K 4.0 4.1 3.7  --   CL 99 98 95*  --   CO2 27 28 31   --   GLUCOSE 137* 105* 83  --   BUN 11 9 15   --   CREATININE 0.65 0.64 0.76  --   CALCIUM 8.6 8.9 9.1  --   MG  --   --   --  1.7   Liver Function Tests: No results for input(s): AST, ALT, ALKPHOS, BILITOT, PROT, ALBUMIN in the last 168 hours. No results for input(s): LIPASE, AMYLASE in the last 168 hours. No results for input(s): AMMONIA in the last 168 hours. CBC:  Recent Labs Lab 01/15/15 0146 01/15/15 1835 01/17/15 0541  WBC 10.5 11.4* 8.9  NEUTROABS 9.1*  --   --   HGB 6.7* 8.2* 7.7*  HCT 27.6* 31.8* 30.4*  MCV 59.9* 62.1* 62.7*  PLT 269 319 320   Cardiac Enzymes:  Recent Labs Lab 01/15/15 0146  TROPONINI <0.03   BNP: Invalid input(s): POCBNP CBG: No results for input(s): GLUCAP in the last 168 hours.  Recent Results (from the past 240 hour(s))  Culture, blood (routine x 2)     Status: None (Preliminary result)   Collection Time: 01/15/15 12:26 PM  Result Value Ref Range Status   Specimen Description BLOOD LEFT HAND  Final  Special Requests BOTTLES DRAWN AEROBIC AND ANAEROBIC 5ML  Final   Culture   Final           BLOOD CULTURE RECEIVED NO GROWTH TO DATE CULTURE WILL BE HELD FOR 5 DAYS BEFORE ISSUING A FINAL NEGATIVE REPORT Performed at [facility]    Report Status PENDING  Incomplete     Scheduled Meds: . carvedilol  3.125 mg Oral BID WC  . enoxaparin (LOVENOX) injection  40 mg Subcutaneous Q24H  . furosemide  40 mg Intravenous Q12H  . levofloxacin  750 mg Oral Daily  . levothyroxine  100 mcg Oral QAC breakfast  . loratadine  10 mg Oral Daily  . losartan  50 mg Oral Daily  . pantoprazole  40 mg Oral Daily  . predniSONE  10 mg Oral Q breakfast  . sodium chloride  3 mL Intravenous  Q12H   Continuous Infusions:    Kim Radliff, DO  [facility] Pager [phone]  If 7PM-7AM, please contact night-coverage [email or web address] Password [email or web address] 01/17/2015, 12:14 PM   LOS: 2 days

## 2015-01-17 NOTE — Discharge Instructions (Signed)
Carvedilol tablets What is this medicine? CARVEDILOL (KAR ve dil ol) is a beta-blocker. Beta-blockers reduce the workload on the heart and help it to beat more regularly. This medicine is used to treat high blood pressure and heart failure. This medicine may be used for other purposes; ask your health care provider or pharmacist if you have questions. COMMON BRAND NAME(S): Coreg What should I tell my health care provider before I take this medicine? They need to know if you have any of these conditions: -circulation problems -diabetes -history of heart attack or heart disease -liver disease -lung or breathing disease, like asthma or emphysema -pheochromocytoma -slow or irregular heartbeat -thyroid disease -an unusual or allergic reaction to carvedilol, other beta-blockers, medicines, foods, dyes, or preservatives -pregnant or trying to get pregnant -breast-feeding How should I use this medicine? Take this medicine by mouth with a glass of water. Follow the directions on the prescription label. It is best to take the tablets with food. Take your doses at regular intervals. Do not take your medicine more often than directed. Do not stop taking except on the advice of your doctor or health care professional. Talk to your pediatrician regarding the use of this medicine in children. Special care may be needed. Overdosage: If you think you have taken too much of this medicine contact a poison control center or emergency room at once. NOTE: This medicine is only for you. Do not share this medicine with others. What if I miss a dose? If you miss a dose, take it as soon as you can. If it is almost time for your next dose, take only that dose. Do not take double or extra doses. What may interact with this medicine? This medicine may interact with the following medications: -certain medicines for blood pressure, heart disease, irregular heart beat -certain medicines for depression, like fluoxetine  or paroxetine -certain medicines for diabetes, like glipizide or glyburide -cimetidine -clonidine -cyclosporine -digoxin -MAOIs like Carbex, Eldepryl, Marplan, Nardil, and Parnate -reserpine -rifampin This list may not describe all possible interactions. Give your health care provider a list of all the medicines, herbs, non-prescription drugs, or dietary supplements you use. Also tell them if you smoke, drink alcohol, or use illegal drugs. Some items may interact with your medicine. What should I watch for while using this medicine? Check your heart rate and blood pressure regularly while you are taking this medicine. Ask your doctor or health care professional what your heart rate and blood pressure should be, and when you should contact him or her. Do not stop taking this medicine suddenly. This could lead to serious heart-related effects. Contact your doctor or health care professional if you have difficulty breathing while taking this drug. Check your weight daily. Ask your doctor or health care professional when you should notify him/her of any weight gain. You may get drowsy or dizzy. Do not drive, use machinery, or do anything that requires mental alertness until you know how this medicine affects you. To reduce the risk of dizzy or fainting spells, do not sit or stand up quickly. Alcohol can make you more drowsy, and increase flushing and rapid heartbeats. Avoid alcoholic drinks. If you have diabetes, check your blood sugar as directed. Tell your doctor if you have changes in your blood sugar while you are taking this medicine. If you are going to have surgery, tell your doctor or health care professional that you are taking this medicine. What side effects may I notice from receiving this medicine?  Side effects that you should report to your doctor or health care professional as soon as possible: -allergic reactions like skin rash, itching or hives, swelling of the face, lips, or  tongue -breathing problems -dark urine -irregular heartbeat -swollen legs or ankles -vomiting -yellowing of the eyes or skin Side effects that usually do not require medical attention (report to your doctor or health care professional if they continue or are bothersome): -change in sex drive or performance -diarrhea -dry eyes (especially if wearing contact lenses) -dry, itching skin -headache -nausea -unusually tired This list may not describe all possible side effects. Call your doctor for medical advice about side effects. You may report side effects to FDA at 1-800-FDA-1088. Where should I keep my medicine? Keep out of the reach of children. Store at room temperature below 30 degrees C (86 degrees F). Protect from moisture. Keep container tightly closed. Throw away any unused medicine after the expiration date. NOTE: This sheet is a summary. It may not cover all possible information. If you have questions about this medicine, talk to your doctor, pharmacist, or health care provider.  2015, Elsevier/Gold Standard. (2013-06-23 14:12:02)

## 2015-01-17 NOTE — Progress Notes (Signed)
Notified Central Monitoring that the pt had a 19 beat run of SVT. Pt is resting in bed with no complaints. VSS. T. Callahan,NP notified and orders received.

## 2015-01-18 DIAGNOSIS — I5033 Acute on chronic diastolic (congestive) heart failure: Principal | ICD-10-CM

## 2015-01-18 DIAGNOSIS — R0902 Hypoxemia: Secondary | ICD-10-CM | POA: Insufficient documentation

## 2015-01-18 DIAGNOSIS — E038 Other specified hypothyroidism: Secondary | ICD-10-CM

## 2015-01-18 DIAGNOSIS — J9620 Acute and chronic respiratory failure, unspecified whether with hypoxia or hypercapnia: Secondary | ICD-10-CM | POA: Insufficient documentation

## 2015-01-18 DIAGNOSIS — D649 Anemia, unspecified: Secondary | ICD-10-CM | POA: Insufficient documentation

## 2015-01-18 DIAGNOSIS — I509 Heart failure, unspecified: Secondary | ICD-10-CM

## 2015-01-18 DIAGNOSIS — I5031 Acute diastolic (congestive) heart failure: Secondary | ICD-10-CM | POA: Insufficient documentation

## 2015-01-18 LAB — BASIC METABOLIC PANEL
Anion gap: 13 (ref 5–15)
BUN: 19 mg/dL (ref 6–23)
CALCIUM: 9.2 mg/dL (ref 8.4–10.5)
CO2: 33 mmol/L — ABNORMAL HIGH (ref 19–32)
Chloride: 93 mmol/L — ABNORMAL LOW (ref 96–112)
Creatinine, Ser: 0.95 mg/dL (ref 0.50–1.10)
GFR calc Af Amer: 80 mL/min — ABNORMAL LOW (ref >90)
GFR calc non Af Amer: 69 mL/min — ABNORMAL LOW (ref >90)
GLUCOSE: 90 mg/dL (ref 70–99)
Potassium: 3.7 mmol/L (ref 3.5–5.1)
Sodium: 139 mmol/L (ref 135–145)

## 2015-01-18 MED ORDER — LOSARTAN POTASSIUM 50 MG PO TABS: 50.0000 mg | ORAL_TABLET | Freq: Every day | ORAL | Status: DC

## 2015-01-18 MED ORDER — PREDNISONE 10 MG PO TABS: 10.0000 mg | ORAL_TABLET | ORAL | Status: DC

## 2015-01-18 MED ORDER — FUROSEMIDE 40 MG PO TABS: 40.0000 mg | ORAL_TABLET | Freq: Every day | ORAL | Status: DC

## 2015-01-18 MED ORDER — METOPROLOL SUCCINATE ER 25 MG PO TB24: 25.0000 mg | ORAL_TABLET | Freq: Every day | ORAL | Status: DC

## 2015-01-18 MED ORDER — LEVOFLOXACIN 750 MG PO TABS
750.0000 mg | ORAL_TABLET | Freq: Every day | ORAL | Status: DC
Start: 2015-01-19 — End: 2015-01-21

## 2015-01-18 NOTE — Discharge Summary (Signed)
Physician Discharge Summary  [name] UXN:[id number] DOB: 11-28-1965 DOA: 01/15/2015  PCP: Kim Ast, [name]  Admit date: 01/15/2015 Discharge date: 01/18/2015  Recommendations for Outpatient Follow-up:  1. Pt will need to follow up with PCP in 1 week post discharge 2. Please obtain BMP and CBC in one week   Discharge Diagnoses:   Acute on chronic respiratory failure -now back on 2L--stable, breathing better -secondary to CHF and PNA superimposed upon underlying OSA/OHS, sarcoidosis, and anemia -chronically on 2L at home -CT angiogram chest negative for pulmonary embolus-Revealed scattered rhonchorous opacities bilateral.  Acute on chronic diastolic CHF -Difficult to obtain weights due to the patient's body habitus -Difficult to assess clinical response due to the patient's body habitus although she is improving clinically and has less leg edema -Continue intravenous furosemide -Urine output 2500 cc past 48 hours -start metoprolol succinate 25 mg daily -Discontinued HCTZ -continue losartan Community acquired pneumonia -Continue levofloxacin x 3 more days to complete 7 days of therapy -check procalcitonin--<0.10 Iron deficiency anemia -FOBT negative -Iron saturation less than 10% -Patient received a dose of Feraheme on 3/18 -continue oral iron tid after d/c -outpt GI followup -baseline Hgb 8 SVT -?sinus with PAC vs atrial tachycardia--<5 seconds -optimize electrolytes -start metoprolol succinate--no further dysrhythmia Hypertension -Continue losartan -Hold HCTZ as the patient continues on intravenous furosemide -metoprolol succinate as above Hypothyroidism -Continue Synthroid Sarcoidosis -Continue maintenance prednisone--pt states she takes 10mg  every 48 hours Morbid Obesity -BMI 75 Discharge Condition: stable  Disposition:      Follow-up Information    Follow up with [name], [place], [name] In 1 week.   Specialty:  Family Medicine   Contact information:   [place] [address] [phone]     home  Diet:heart healthy Wt Readings from Last 3 Encounters:  01/18/15 206.16 kg (454 lb 8 oz)  11/13/14 219.541 kg (484 lb)  09/05/14 219.178 kg (483 lb 3.2 oz)    History of present illness:  49 year old female with a history of chronic respiratory failure, sarcoidosis, hypertension, OSA presented with two-day history of worsening shortness of breath and fatigue. The patient states that she actually had shortness of breath and URI type symptoms for approximately one week prior to admission. She denied any fevers or chills but had cough with pink tinged sputum. She denied any nausea, vomiting, diarrhea, abdominal pain, dysuria. At the time of admission, CT angiogram of the chest was performed and was negative for pulmonary embolus revealed scattered groundglass opacities in the central and basilar regions as well as a small left effusion and hilar and mediastinal lymphadenopathy. The patient was initially started on levofloxacin at the time of admission. Because of slow clinical improvement, the patient was started on furosemide IV on 01/16/2015. At baseline, the patient takes prednisone 10 mg every other day. The patient didn't appear clinically fluid overloaded. She had good clinical response with intravenous furosemide. Her labs responded appropriately with increasing bicarbonate and serum creatinine. The patient will be discharged home with furosemide 40 mg daily as well as metoprolol succinate 25 mg daily. She will continue on losartan without HCTZ. The patient was instructed to follow-up with her primary care provider within one week.  Discharge Exam: Filed Vitals:   01/18/15 0613  BP: 135/79  Pulse: 85  Temp: 98.1 F (36.7 C)  Resp: 20   Filed Vitals:   01/17/15 2056 01/18/15 0200 01/18/15 0613 01/18/15 0804  BP: 136/71 138/65 135/79   Pulse: 90 80 85  Temp: 98.5 F (36.9 C) 98.3 F (36.8 C)  98.1 F (36.7 C)   TempSrc: Oral Oral Oral   Resp: 20 20 20    Height:      Weight:    206.16 kg (454 lb 8 oz)  SpO2: 98% 100% 96%    General: A&O x 3, NAD, pleasant, cooperative Cardiovascular: RRR, no rub, no gallop, no S3 Respiratory: CTAB, no wheeze, no rhonchi Abdomen:soft, nontender, nondistended, positive bowel sounds Extremities: 1+ edema, No lymphangitis, no petechiae  Discharge Instructions     Medication List    STOP taking these medications        losartan-hydrochlorothiazide 50-12.5 MG per tablet  Commonly known as:  HYZAAR     meloxicam 7.5 MG tablet  Commonly known as:  MOBIC      TAKE these medications        albuterol 108 (90 BASE) MCG/ACT inhaler  Commonly known as:  PROVENTIL HFA;VENTOLIN HFA  Inhale 1-2 puffs into the lungs every 6 (six) hours as needed for wheezing.     cetirizine 10 MG tablet  Commonly known as:  ZYRTEC  Take 10 mg by mouth at bedtime as needed for allergies.     ferrous sulfate 325 (65 FE) MG tablet  Take 1 tablet (325 mg total) by mouth 3 (three) times daily with meals.     furosemide 40 MG tablet  Commonly known as:  LASIX  Take 1 tablet (40 mg total) by mouth daily.  Start taking on:  01/19/2015     HYDROcodone-acetaminophen 5-325 MG per tablet  Commonly known as:  NORCO/VICODIN  Take 1 tablet by mouth every 6 (six) hours as needed for moderate pain.     KENALOG 40 MG/ML injection  Generic drug:  triamcinolone acetonide  Inject 40 mg into the muscle every 3 (three) months. For knee pain     levofloxacin 750 MG tablet  Commonly known as:  LEVAQUIN  Take 1 tablet (750 mg total) by mouth daily.  Start taking on:  01/19/2015     levothyroxine 100 MCG tablet  Commonly known as:  SYNTHROID, LEVOTHROID  Take 1 tablet (100 mcg total) by mouth daily at 6 (six) AM.     losartan 50 MG tablet  Commonly known as:  COZAAR  Take 1 tablet (50 mg total) by mouth daily.     methocarbamol 500 MG tablet  Commonly known as:   ROBAXIN  Take 500 mg by mouth every 6 (six) hours as needed for muscle spasms.     metoprolol succinate 25 MG 24 hr tablet  Commonly known as:  TOPROL-XL  Take 1 tablet (25 mg total) by mouth daily.     NON FORMULARY  2 liter of oxygen     predniSONE 10 MG tablet  Commonly known as:  DELTASONE  Take 1 tablet (10 mg total) by mouth every other day.  Start taking on:  01/19/2015         The results of significant diagnostics from this hospitalization (including imaging, microbiology, ancillary and laboratory) are listed below for reference.    Significant Diagnostic Studies: Ct Angio Chest Pe W/cm &/or Wo Cm  01/15/2015   CLINICAL DATA:  Productive cough, dyspnea, elevated D-dimer  EXAM: CT ANGIOGRAPHY CHEST WITH CONTRAST  TECHNIQUE: Multidetector CT imaging of the chest was performed using the standard protocol during bolus administration of intravenous contrast. Multiplanar CT image reconstructions and MIPs were obtained to evaluate the vascular anatomy.  CONTRAST:  115mL OMNIPAQUE IOHEXOL  350 MG/ML SOLN  COMPARISON:  Radiographs 01/15/2015  FINDINGS: The study is limited due to patient body habitus. No large or central pulmonary embolus is evident. Smaller emboli and peripheral emboli may not be apparent due to the study limitations. The thoracic aorta is normal in caliber and intact. There are scattered linear and ground-glass opacities in both lungs, predominantly central and basilar distribution. There is a small left pleural effusion. There is adenopathy in the mediastinum and both hila. There is a right peritracheal node measuring 2.5 cm. There are hilar nodes measuring up to 1.6 cm. The appearances are consistent with the described history of sarcoidosis. The central airways are patent. No significant abnormality is evident in the upper abdomen  Review of the MIP images confirms the above findings.  IMPRESSION: 1. Limited study due to patient body habitus. 2. No large or central  pulmonary embolus 3. Scattered airspace opacities and small left effusion. This could represent congestive heart failure. Infectious or inflammatory pneumonitis is also possibility. 4. Moderately prominent mediastinal and hilar nodes, consistent with the described history of sarcoidosis.   Electronically Signed   By: Andreas Newport M.D.   On: 01/15/2015 05:15   Dg Chest Port 1 View  01/15/2015   CLINICAL DATA:  Cough and shortness of breath, subacute onset. Initial encounter.  EXAM: PORTABLE CHEST - 1 VIEW  COMPARISON:  Chest radiograph performed 12/14/2013, and CT of the chest performed 04/21/2014  FINDINGS: The lungs are hypoexpanded. Vascular congestion is noted. Patchy bilateral airspace opacities may reflect pulmonary edema or multifocal pneumonia. A small left pleural effusion is noted. No pneumothorax is seen.  The cardiomediastinal silhouette is enlarged. No acute osseous abnormalities are seen.  IMPRESSION: Lungs hypoexpanded. Vascular congestion and cardiomegaly. Patchy bilateral airspace opacities may reflect pulmonary edema or multifocal pneumonia. Small left pleural effusion noted.   Electronically Signed   By: Garald Balding M.D.   On: 01/15/2015 02:08     Microbiology: Recent Results (from the past 240 hour(s))  Culture, blood (routine x 2)     Status: None (Preliminary result)   Collection Time: 01/15/15 12:26 PM  Result Value Ref Range Status   Specimen Description BLOOD LEFT HAND  Final   Special Requests BOTTLES DRAWN AEROBIC AND ANAEROBIC 5ML  Final   Culture   Final           BLOOD CULTURE RECEIVED NO GROWTH TO DATE CULTURE WILL BE HELD FOR 5 DAYS BEFORE ISSUING A FINAL NEGATIVE REPORT Performed at [facility]    Report Status PENDING  Incomplete     Labs: Basic Metabolic Panel:  Recent Labs Lab 01/15/15 0146 01/15/15 1835 01/17/15 0541 01/17/15 0750 01/18/15 0615  NA 134* 134* 140  --  139  K 4.0 4.1 3.7  --  3.7  CL 99 98 95*  --  93*  CO2 27 28 31    --  33*  GLUCOSE 137* 105* 83  --  90  BUN 11 9 15   --  19  CREATININE 0.65 0.64 0.76  --  0.95  CALCIUM 8.6 8.9 9.1  --  9.2  MG  --   --   --  1.7  --    Liver Function Tests: No results for input(s): Macias, ALT, ALKPHOS, BILITOT, PROT, ALBUMIN in the last 168 hours. No results for input(s): LIPASE, AMYLASE in the last 168 hours. No results for input(s): AMMONIA in the last 168 hours. CBC:  Recent Labs Lab 01/15/15 0146 01/15/15 1835 01/17/15  0541  WBC 10.5 11.4* 8.9  NEUTROABS 9.1*  --   --   HGB 6.7* 8.2* 7.7*  HCT 27.6* 31.8* 30.4*  MCV 59.9* 62.1* 62.7*  PLT 269 319 320   Cardiac Enzymes:  Recent Labs Lab 01/15/15 0146  TROPONINI <0.03   BNP: Invalid input(s): POCBNP CBG: No results for input(s): GLUCAP in the last 168 hours.  Time coordinating discharge:  Greater than 30 minutes  Signed:  Kalana Yust, DO [facility] Pager: [phone] 01/18/2015, 11:23 AM

## 2015-01-21 ENCOUNTER — Ambulatory Visit: Payer: Medicare Other | Admitting: Family Medicine

## 2015-01-21 ENCOUNTER — Encounter: Payer: Self-pay | Admitting: Family Medicine

## 2015-01-21 ENCOUNTER — Ambulatory Visit (INDEPENDENT_AMBULATORY_CARE_PROVIDER_SITE_OTHER): Payer: Medicare Other | Admitting: Family Medicine

## 2015-01-21 VITALS — BP 132/74 | HR 88 | Temp 98.0°F | Wt >= 6400 oz

## 2015-01-21 DIAGNOSIS — D500 Iron deficiency anemia secondary to blood loss (chronic): Secondary | ICD-10-CM

## 2015-01-21 DIAGNOSIS — D869 Sarcoidosis, unspecified: Secondary | ICD-10-CM

## 2015-01-21 DIAGNOSIS — J9610 Chronic respiratory failure, unspecified whether with hypoxia or hypercapnia: Secondary | ICD-10-CM

## 2015-01-21 DIAGNOSIS — I5033 Acute on chronic diastolic (congestive) heart failure: Secondary | ICD-10-CM | POA: Diagnosis not present

## 2015-01-21 DIAGNOSIS — D509 Iron deficiency anemia, unspecified: Secondary | ICD-10-CM

## 2015-01-21 LAB — CULTURE, BLOOD (ROUTINE X 2): CULTURE: NO GROWTH

## 2015-01-21 NOTE — Progress Notes (Signed)
Pre visit review using our clinic review tool, if applicable. No additional management support is needed unless otherwise documented below in the visit note. 

## 2015-01-21 NOTE — Progress Notes (Signed)
Subjective:    Patient ID: [name], female    DOB: [birth year], 49 y.o.   MRN: [id number]  HPI Patient seen for hospital follow-up. She is new to me. She has seen nurse practitioner here previously. She has chronic problems including morbid obesity, sarcoidosis, obstructive sleep apnea, obesity hypoventilation, iron deficiency anemia, hypothyroidism, hypertension. She was admitted on 01/15/2015 with acute on chronic respiratory failure. There is question of pneumonia which was treated with antibiotics. Her respiratory failure related to heart failure superimposed on underlying obstructive sleep apnea, sarcoidosis, anemia. Patient chronic oxygen. CT angiogram no pulmonary embolus.  Patient started on furosemide and low-dose metoprolol. Discontinued HCTZ. Continue to losartan. Community-acquired pneumonia treated with Levaquin.  Chronic anemia with history of heavy menses and reported fibroids. Hemoccult negative in hospital. Patient received blood transfusion in hospital. Baseline hemoglobin around 8.  She has sarcoidosis treated with prednisone 10 mg every other day.. She feels that she is back to baseline from respiratory status at this time. She remains on furosemide. She is unfortunately still taking meloxicam.  Past Medical History  Diagnosis Date  . Hypertension   . Hypothyroidism   . Anemia   . Morbid obesity   . Gallstones   . Seasonal allergies   . Chronic hyperventilation syndrome     w/ obesity tx with albuterol inhaler and oxygen 2L  . Shortness of breath   . Sleep apnea     uses CPAP machine   . GERD (gastroesophageal reflux disease)     diet controlled - no meds  . H/O hiatal hernia   . Arthritis     hands, shoulders, no meds  . Pneumonia     hoispitalized in 08/2011  . COPD (chronic obstructive pulmonary disease)     uses oxygen 2 L  . Sarcoidosis    Past Surgical History  Procedure Laterality Date  . Cholecystectomy  2000  . Cesarean section  1994     x 1  . I and d of abcess  05/2011  . Hysteroscopy w/d&c  12/27/2011    Procedure: DILATATION AND CURETTAGE /HYSTEROSCOPY;  Surgeon: Maeola Sarah. Landry Mellow, MD;  Location: [facility];  Service: Gynecology;;  . Lung biopsy    . Uterine abletion      reports that she has never smoked. She has never used smokeless tobacco. She reports that she drinks alcohol. She reports that she does not use illicit drugs. family history includes Aneurysm in her sister; Deep vein thrombosis in her mother; Diabetes in her brother and father. No Known Allergies    Review of Systems  Constitutional: Negative for fever and chills.  Respiratory: Positive for shortness of breath (Chronic and unchanged). Negative for cough and wheezing.   Cardiovascular: Negative for chest pain and palpitations.  Gastrointestinal: Negative for abdominal pain.  Genitourinary: Negative for dysuria.  Neurological: Negative for dizziness, syncope and weakness.       Objective:   Physical Exam  Constitutional:  Morbidly obese female in no distress. She has oxygen in place  Neck: Neck supple. No JVD present.  Cardiovascular: Normal rate and regular rhythm.   Pulmonary/Chest: Effort normal and breath sounds normal. No respiratory distress. She has no wheezes. She has no rales.  Musculoskeletal:  No pitting edema in legs  Vitals reviewed.         Assessment & Plan:  #1 recent acute on chronic respiratory failure. She is back to baseline. Recent medication changes of addition low-dose metoprolol and discontinuation HCTZ and initiation  furosemide 40 mg daily. Check basic metabolic panel. She cannot do daily weights because of her size. #2 hypertension which is stable. Continue losartan, furosemide, and low-dose metoprolol #3 history of iron deficiency anemia. Repeat CBC. She's had GYN follow-up in the past. Recent Hemoccult negative. Likely heavy menstrual loss which she reports monthly. Not a candidate for hormonal therapy with her size  and high risk for PE/DVT. She is also not a good surgical candidate.

## 2015-01-22 LAB — CBC WITH DIFFERENTIAL/PLATELET
Basophils Absolute: 0 10*3/uL (ref 0.0–0.1)
Basophils Relative: 0.1 % (ref 0.0–3.0)
EOS PCT: 1.1 % (ref 0.0–5.0)
Eosinophils Absolute: 0.1 10*3/uL (ref 0.0–0.7)
HEMATOCRIT: 33 % — AB (ref 36.0–46.0)
Hemoglobin: 9.4 g/dL — ABNORMAL LOW (ref 12.0–15.0)
LYMPHS ABS: 0.7 10*3/uL (ref 0.7–4.0)
Lymphocytes Relative: 6.9 % — ABNORMAL LOW (ref 12.0–46.0)
MCHC: 28.4 g/dL — ABNORMAL LOW (ref 30.0–36.0)
MCV: 61.8 fl — ABNORMAL LOW (ref 78.0–100.0)
MONOS PCT: 6 % (ref 3.0–12.0)
Monocytes Absolute: 0.6 10*3/uL (ref 0.1–1.0)
Neutro Abs: 8.8 10*3/uL — ABNORMAL HIGH (ref 1.4–7.7)
Neutrophils Relative %: 85.9 % — ABNORMAL HIGH (ref 43.0–77.0)
Platelets: 216 10*3/uL (ref 150.0–400.0)
RBC: 5.33 Mil/uL — ABNORMAL HIGH (ref 3.87–5.11)
RDW: 34.5 % — AB (ref 11.5–15.5)
WBC: 10.2 10*3/uL (ref 4.0–10.5)

## 2015-01-22 LAB — BASIC METABOLIC PANEL
BUN: 19 mg/dL (ref 6–23)
CO2: 32 mEq/L (ref 19–32)
Calcium: 9.4 mg/dL (ref 8.4–10.5)
Chloride: 97 mEq/L (ref 96–112)
Creatinine, Ser: 0.87 mg/dL (ref 0.40–1.20)
GFR: 88.89 mL/min (ref >60.00)
GLUCOSE: 100 mg/dL — AB (ref 70–99)
POTASSIUM: 4.8 meq/L (ref 3.5–5.1)
Sodium: 138 mEq/L (ref 135–145)

## 2015-02-11 DIAGNOSIS — M1712 Unilateral primary osteoarthritis, left knee: Secondary | ICD-10-CM | POA: Diagnosis not present

## 2015-02-11 DIAGNOSIS — M1711 Unilateral primary osteoarthritis, right knee: Secondary | ICD-10-CM | POA: Diagnosis not present

## 2015-02-23 DIAGNOSIS — R0602 Shortness of breath: Secondary | ICD-10-CM | POA: Insufficient documentation

## 2015-02-23 DIAGNOSIS — D509 Iron deficiency anemia, unspecified: Secondary | ICD-10-CM | POA: Insufficient documentation

## 2015-03-09 ENCOUNTER — Telehealth: Payer: Self-pay | Admitting: Pulmonary Disease

## 2015-03-09 NOTE — Telephone Encounter (Signed)
Patient notified.  No questions or concerns at this time. Nothing further needed.   

## 2015-03-09 NOTE — Telephone Encounter (Signed)
Increase to 10mg  daily for 2 weeks then alternate day 10/5 mg until OV

## 2015-03-09 NOTE — Telephone Encounter (Signed)
Spoke with pt. States she would like her prednisone increased. Reports increased joint pain and chest tightness. She is currently taking prednisone 10mg  every other day.  RA - please advise. Thanks.

## 2015-03-11 ENCOUNTER — Other Ambulatory Visit: Payer: Self-pay | Admitting: Pulmonary Disease

## 2015-03-11 DIAGNOSIS — G4733 Obstructive sleep apnea (adult) (pediatric): Secondary | ICD-10-CM

## 2015-03-12 ENCOUNTER — Other Ambulatory Visit: Payer: Self-pay | Admitting: Pulmonary Disease

## 2015-03-13 ENCOUNTER — Other Ambulatory Visit: Payer: Self-pay | Admitting: Pulmonary Disease

## 2015-03-24 ENCOUNTER — Other Ambulatory Visit (INDEPENDENT_AMBULATORY_CARE_PROVIDER_SITE_OTHER): Payer: Medicare Other

## 2015-03-24 ENCOUNTER — Ambulatory Visit (INDEPENDENT_AMBULATORY_CARE_PROVIDER_SITE_OTHER): Payer: Medicare Other | Admitting: Pulmonary Disease

## 2015-03-24 ENCOUNTER — Encounter: Payer: Self-pay | Admitting: Pulmonary Disease

## 2015-03-24 VITALS — BP 118/84 | HR 93 | Ht 66.0 in | Wt >= 6400 oz

## 2015-03-24 DIAGNOSIS — D869 Sarcoidosis, unspecified: Secondary | ICD-10-CM

## 2015-03-24 DIAGNOSIS — J189 Pneumonia, unspecified organism: Secondary | ICD-10-CM

## 2015-03-24 DIAGNOSIS — G4733 Obstructive sleep apnea (adult) (pediatric): Secondary | ICD-10-CM | POA: Diagnosis not present

## 2015-03-24 DIAGNOSIS — D509 Iron deficiency anemia, unspecified: Secondary | ICD-10-CM | POA: Diagnosis not present

## 2015-03-24 LAB — BASIC METABOLIC PANEL
BUN: 15 mg/dL (ref 6–23)
CALCIUM: 9.1 mg/dL (ref 8.4–10.5)
CO2: 31 meq/L (ref 19–32)
CREATININE: 0.6 mg/dL (ref 0.40–1.20)
Chloride: 98 mEq/L (ref 96–112)
GFR: 136.38 mL/min (ref >60.00)
Glucose, Bld: 106 mg/dL — ABNORMAL HIGH (ref 70–99)
Potassium: 4.3 mEq/L (ref 3.5–5.1)
SODIUM: 136 meq/L (ref 135–145)

## 2015-03-24 LAB — CBC WITH DIFFERENTIAL/PLATELET
BASOS ABS: 0 10*3/uL (ref 0.0–0.1)
BASOS PCT: 0.1 % (ref 0.0–3.0)
EOS ABS: 0 10*3/uL (ref 0.0–0.7)
Eosinophils Relative: 0.2 % (ref 0.0–5.0)
HEMATOCRIT: 34.3 % — AB (ref 36.0–46.0)
HEMOGLOBIN: 10.5 g/dL — AB (ref 12.0–15.0)
Lymphocytes Relative: 6.4 % — ABNORMAL LOW (ref 12.0–46.0)
Lymphs Abs: 0.8 10*3/uL (ref 0.7–4.0)
MCHC: 30.5 g/dL (ref 30.0–36.0)
MONOS PCT: 3.7 % (ref 3.0–12.0)
Monocytes Absolute: 0.5 10*3/uL (ref 0.1–1.0)
NEUTROS ABS: 11.3 10*3/uL — AB (ref 1.4–7.7)
Neutrophils Relative %: 89.6 % — ABNORMAL HIGH (ref 43.0–77.0)
PLATELETS: 384 10*3/uL (ref 150.0–400.0)
RBC: 5.24 Mil/uL — AB (ref 3.87–5.11)
RDW: 25.8 % — ABNORMAL HIGH (ref 11.5–15.5)
WBC: 12.6 10*3/uL — ABNORMAL HIGH (ref 4.0–10.5)

## 2015-03-24 MED ORDER — PREDNISONE 10 MG PO TABS: ORAL_TABLET | ORAL | Status: DC

## 2015-03-24 NOTE — Assessment & Plan Note (Signed)
Recheck CBC Ct iron

## 2015-03-24 NOTE — Patient Instructions (Signed)
Blood work today Prednisone 10 mg tabs  Take 4 tabs daily with food x 5ds, then 3 tab daily with food x 5ds then 2 tabs daily Take vit D & calcium tabs daily while on prednisone Referral to rheumatology for alternative causes of arthritis

## 2015-03-24 NOTE — Progress Notes (Signed)
Subjective:    Patient ID: [name], female    DOB: 1966-05-14, 49 y.o.   MRN: [id number]  HPI  [name]   49 yo morbidly obese woman with OSA & BL infiltrates & hilar and mediastinal lymphadenopathy , presumed sarcoid (although TBBX neg ) with skin & joint involvement  Significant tests/ events:  First seen in hospital 01/26/11  for acute dyspnea and hypoxia found to have OHS and OSA with recs for continuous O2 ( 2 L/m at rest & 4L/m on walking)and Nocturnal CPAP  Echo- mild LVH , EF 55-60% . VQ scan intermed prob , doppler neg. CT chest  neg for PE.  CPAP titration 03/2011 (wt 479 lbs) -CPAP 7 cm to stop snoring, No desaturation on O2 2 L/min   09/2011 - Hosp adm for hemoptysis & BL infiltrates on CT,p-anca pos 1.80, gbm neg, ANA neg, ESR 42  Labs c/w fe def anemia, UA pos blood (was having periods) >> rpt UA no RBCs  Underwent endometrial ablation for menorrhagia -unsuccessful  08/2012 admitted for atypical chest pain, and shortness of breath.  CT chest was negative for PE, notable, hilar and mediastinal lymphadenopathy and cardiomegaly  C-ANCA 1:40, ANA 1:320 , homogenous  ACE level 21   10/02/2012 TBBX >> no granulomas, BAL neg afb, fungal  12/2012 -Was seen by Dr. [name] at [place]. repeat labs>> neg pANCA and cANCA ,ESR 48 and neg ANA , low ACE level    CT chest 12/2013 >decreased hilar /mediastinal adenopathy-(done 1 week after steroids started ) >>>Pred decreased to 88m daily   CT chest 04/24/14 - Scattered pulmonary opacities with ground-glass densities, clustered reticulonodular densities and interstitial thickening  06/12/14  Patient reports previous biopsy of, arm, rash, by dermatology was consistent with sun exposure dermatitis.   10/2014 PFTs-FVC 55%, DLCO 44%     03/24/2015  Chief Complaint  Patient presents with  . Follow-up    SOB, DOE, sometimes has chest tightness, hospitalized 2 months ago, also here for  hospital follow up.  Pt struggles still on 13mof Prednisone.  She took 2050mf Prednisone today to get to the appointment.   03/09/15 increased joint pain and chest tightness - on prednisone 40m43mery other day - Increased to 40mg67mly , today she took 20 mg because she was having severe joint pains in the morning  Adm 12/2014 for acute resp failure secondary to CHF and PNA superimposed upon underlying OSA/OHS, sarcoidosis, and anemia -required 2 units PRBC for hemoglobin 6.7 CT angiogram chest negative for pulmonary embolus-Revealed scattered  opacities bilateral  She has been on and off prednisone since 12/2013-started for skin rash and joint pains  -more compliant with O2    Past Medical History  Diagnosis Date  . Hypertension   . Hypothyroidism   . Anemia   . Morbid obesity   . Gallstones   . Seasonal allergies   . Chronic hyperventilation syndrome     w/ obesity tx with albuterol inhaler and oxygen 2L  . Shortness of breath   . Sleep apnea     uses CPAP machine   . GERD (gastroesophageal reflux disease)     diet controlled - no meds  . H/O hiatal hernia   . Arthritis     hands, shoulders, no meds  . Pneumonia     hoispitalized in 08/2011  . COPD (chronic obstructive pulmonary disease)     uses oxygen 2 L  . Sarcoidosis  Review of Systems neg for any significant sore throat, dysphagia, itching, sneezing, nasal congestion or excess/ purulent secretions, fever, chills, sweats, unintended wt loss, pleuritic or exertional cp, hempoptysis, orthopnea pnd or change in chronic leg swelling. Also denies presyncope, palpitations, heartburn, abdominal pain, nausea, vomiting, diarrhea or change in bowel or urinary habits, dysuria,hematuria, rash, arthralgias, visual complaints, headache, numbness weakness or ataxia.     Objective:   Physical Exam  Gen. Pleasant, obese, in no distress, normal affect ENT - no lesions, no post nasal drip, class 2-3 airway Neck: No JVD, no  thyromegaly, no carotid bruits Lungs: no use of accessory muscles, no dullness to percussion, decreased without rales or rhonchi  Cardiovascular: Rhythm regular, heart sounds  normal, no murmurs or gallops, no peripheral edema Abdomen: soft and non-tender, no hepatosplenomegaly, BS normal. Musculoskeletal: No deformities, no cyanosis or clubbing Neuro:  alert, non focal, no tremors       Assessment & Plan:

## 2015-03-24 NOTE — Assessment & Plan Note (Addendum)
Prednisone 10 mg tabs  Take 4 tabs daily with food x 5ds, then 3 tab daily with food x 5ds then 2 tabs daily - FU in 4 weeks & if better, can then taper by 5mg  slowly over next 3 months to baseline dose of 10 mg Take vit D & calcium tabs daily while on prednisone Referral to rheumatology for alternative causes of arthritis & anemia - sarcoid does seem most likely but does not explain anemia

## 2015-03-25 LAB — ANTI-NUCLEAR AB-TITER (ANA TITER): ANA Titer 1: 1:1280 {titer} — ABNORMAL HIGH

## 2015-03-25 LAB — RHEUMATOID FACTOR: Rhuematoid fact SerPl-aCnc: 15 IU/mL — ABNORMAL HIGH (ref <=14)

## 2015-03-25 LAB — ANA: ANA: POSITIVE — AB

## 2015-03-25 NOTE — Assessment & Plan Note (Signed)
Continue CPAP with 2 L oxygen blended in

## 2015-03-25 NOTE — Assessment & Plan Note (Signed)
Completed Levaquin 12/2014

## 2015-04-11 ENCOUNTER — Other Ambulatory Visit: Payer: Self-pay | Admitting: Family

## 2015-04-13 ENCOUNTER — Telehealth: Payer: Self-pay | Admitting: Family

## 2015-04-13 NOTE — Telephone Encounter (Signed)
error 

## 2015-04-14 ENCOUNTER — Encounter: Payer: Self-pay | Admitting: Adult Health

## 2015-04-14 ENCOUNTER — Ambulatory Visit (INDEPENDENT_AMBULATORY_CARE_PROVIDER_SITE_OTHER): Payer: Medicare Other | Admitting: Adult Health

## 2015-04-14 VITALS — BP 144/90 | Temp 98.6°F | Ht 66.0 in | Wt >= 6400 oz

## 2015-04-14 DIAGNOSIS — Z7189 Other specified counseling: Secondary | ICD-10-CM | POA: Diagnosis not present

## 2015-04-14 DIAGNOSIS — Z7689 Persons encountering health services in other specified circumstances: Secondary | ICD-10-CM

## 2015-04-14 DIAGNOSIS — F419 Anxiety disorder, unspecified: Secondary | ICD-10-CM

## 2015-04-14 MED ORDER — ALPRAZOLAM 1 MG PO TABS: 1.0000 mg | ORAL_TABLET | Freq: Two times a day (BID) | ORAL | Status: DC | PRN

## 2015-04-14 NOTE — Progress Notes (Signed)
Pre visit review using our clinic review tool, if applicable. No additional management support is needed unless otherwise documented below in the visit note. 

## 2015-04-14 NOTE — Progress Notes (Signed)
HPI:  [name] is here to establish care. She is a pleasant 49 year old morbidly obese African-American female. She does not smoke.  Last PCP and physical:Unknown  Has the following problems that require follow up and concerns today:  She has anxiety and would like her Xanax filled. She only takes it on occasion. Most recently her son was involved in a car accident, she has been anxious and is unable to sleep.   Has SOB from COPD and sarcoidosis. Wears 2 L via [place] ans is followed by Pulm.   ROS negative for unless reported above: fevers, chills,feeling poorly, unintentional weight loss, hearing or vision loss, chest pain, palpitations, leg claudication,. Not feeling congested in the chest, no orthopenia, no cough,no wheezing, normal appetite, no soft tissue swelling, no hemoptysis, melena, hematochezia, hematuria, falls, loc, si, or thoughts of self harm.  Immunizations:UTD Diet:Fruits and vegetables. Does not eat fast food. Exercise:Does not execise due to sarcoidosis.  Pap Smear: 2013 had abnormal pap, was referred to oncology but never went.  Mammogram: Will schedule  Past Medical History  Diagnosis Date  . Hypertension   . Hypothyroidism   . Anemia   . Morbid obesity   . Gallstones   . Seasonal allergies   . Chronic hyperventilation syndrome     w/ obesity tx with albuterol inhaler and oxygen 2L  . Shortness of breath   . Sleep apnea     uses CPAP machine   . GERD (gastroesophageal reflux disease)     diet controlled - no meds  . H/O hiatal hernia   . Arthritis     hands, shoulders, no meds  . Pneumonia     hoispitalized in 08/2011  . COPD (chronic obstructive pulmonary disease)     uses oxygen 2 L  . Sarcoidosis     Past Surgical History  Procedure Laterality Date  . Cholecystectomy  2000  . Cesarean section  1994    x 1  . I and d of abcess  05/2011  . Hysteroscopy w/d&c  12/27/2011    Procedure: DILATATION AND CURETTAGE /HYSTEROSCOPY;  Surgeon:  Maeola Sarah. Landry Mellow, MD;  Location: [facility];  Service: Gynecology;;  . Lung biopsy    . Uterine abletion      Family History  Problem Relation Age of Onset  . Diabetes Father   . Diabetes Brother   . Deep vein thrombosis Mother   . Aneurysm Sister     d/o brain aneurysm    History   Social History  . Marital Status: Single    Spouse Name: N/A  . Number of Children: N/A  . Years of Education: N/A   Occupational History  . unemployeed    Social History Main Topics  . Smoking status: Never Smoker   . Smokeless tobacco: Never Used  . Alcohol Use: Yes     Comment: occasionally  . Drug Use: No  . Sexual Activity: No   Other Topics Concern  . None   Social History Narrative     Current outpatient prescriptions:  .  albuterol (PROVENTIL HFA;VENTOLIN HFA) 108 (90 BASE) MCG/ACT inhaler, Inhale 1-2 puffs into the lungs every 6 (six) hours as needed for wheezing., Disp: 1 Inhaler, Rfl: 0 .  cetirizine (ZYRTEC) 10 MG tablet, Take 10 mg by mouth at bedtime as needed for allergies., Disp: , Rfl:  .  ferrous sulfate 325 (65 FE) MG tablet, Take 1 tablet (325 mg total) by mouth 3 (three) times daily with meals.,  Disp: 90 tablet, Rfl: 0 .  furosemide (LASIX) 40 MG tablet, Take 1 tablet (40 mg total) by mouth daily., Disp: 30 tablet, Rfl: 1 .  HYDROcodone-acetaminophen (NORCO/VICODIN) 5-325 MG per tablet, Take 1 tablet by mouth every 6 (six) hours as needed for moderate pain., Disp: , Rfl:  .  levothyroxine (SYNTHROID, LEVOTHROID) 100 MCG tablet, Take 1 tablet (100 mcg total) by mouth daily at 6 (six) AM., Disp: 30 tablet, Rfl: 4 .  losartan (COZAAR) 50 MG tablet, TAKE 1 TABLET (50 MG TOTAL) BY MOUTH DAILY., Disp: 30 tablet, Rfl: 1 .  methocarbamol (ROBAXIN) 500 MG tablet, Take 500 mg by mouth every 6 (six) hours as needed for muscle spasms., Disp: , Rfl:  .  metoprolol succinate (TOPROL-XL) 25 MG 24 hr tablet, TAKE 1 TABLET (25 MG TOTAL) BY MOUTH DAILY., Disp: 30 tablet, Rfl: 1 .  NON  FORMULARY, 2 liter of oxygen, Disp: , Rfl:  .  predniSONE (DELTASONE) 10 MG tablet, Take 1 tablet (10 mg total) by mouth every other day., Disp: 30 tablet, Rfl: 0 .  triamcinolone acetonide (KENALOG) 40 MG/ML injection, Inject 40 mg into the muscle every 3 (three) months. For knee pain, Disp: , Rfl:  .  triamcinolone ointment (KENALOG) 0.1 %, Apply 1 application topically 2 (two) times daily. Once or twice weekly as needed for break out of Sarcoidosis, Disp: , Rfl:   EXAM:  Filed Vitals:   04/14/15 1505  BP: 144/90  Temp: 98.6 F (37 C)    Body mass index is 75.38 kg/(m^2).  GENERAL: vitals reviewed and listed above, alert, oriented, appears well hydrated and in no acute distress. She is morbidly obese and is not wearing her oxygen in the exam room today.  HEENT: atraumatic, conjunttiva clear, no obvious abnormalities on inspection of external nose and ears. Tympanic membranes visualized in bilateral ears, she is wearing her glasses.  NECK: Neck is soft and supple without masses, no adenopathy or thyromegaly, trachea midline, no JVD. Normal range of motion.   LUNGS: clear to auscultation bilaterally, no wheezes, rales or rhonchi, good air movement  CV: Regular rate and rhythm, normal S1/S2, no audible murmurs, gallops, or rubs. No carotid bruit and no peripheral edema.   MS: moves all extremities without noticeable abnormality. Difficult edema in bilateral lower extremities  Abd: soft/nontender/nondistended/normal bowel sounds . Morbidly obese around abdomen  Skin: warm and dry, no rash   Extremities: No clubbing, cyanosis, or edema. Capillary refill is WNL. Pulses intact bilaterally in upper and lower extremities.   Neuro: CN II-XII intact, sensation and reflexes normal throughout, 5/5 muscle strength in bilateral upper and lower extremities. Normal finger to nose. Normal rapid alternating movements.    PSYCH: pleasant and cooperative, no obvious depression or  anxiety  ASSESSMENT AND PLAN:  1. Encounter to establish care -Follow up with me in one month for complete physical exam Follow up sooner if needed -I stressed the importance of following up with GYN due to her abnormal Pap smear in 2013 and then subsequently been sent to oncology in which she did not follow up with. Currently no vaginal bleeding.  2. Morbid obesity -We need to work on her diet since her portion control. She does endorse eating healthy foods. -Spoke the importance of regular exercise. Due to her complex medical history including COPD and sarcoidosis as well as her morbid obesity I advised her to possibly start off in a pool. Overdue resistance exercises from the couch.  3. Anxiety - ALPRAZolam (  XANAX) 1 MG tablet; Take 1 tablet (1 mg total) by mouth 2 (two) times daily as needed for anxiety.  Dispense: 20 tablet; Refill: 0   No diagnosis found. -We reviewed the PMH, PSH, FH, SH, Meds and Allergies. -We provided refills for any medications we will prescribe as needed. -We addressed current concerns per orders and patient instructions. -We have asked for records for pertinent exams, studies, vaccines and notes from previous providers. -We have advised patient to follow up per instructions below.   -Patient advised to return or notify a provider immediately if symptoms worsen or persist or new concerns arise.  There are no Patient Instructions on file for this visit.   [employer]

## 2015-04-14 NOTE — Patient Instructions (Addendum)
It was great meeting you today. Please follow up with me at the end of July for a complete physical.   Continue to work on diet and exercise.   Let me know if you need anything in the mean time.

## 2015-04-21 ENCOUNTER — Ambulatory Visit (INDEPENDENT_AMBULATORY_CARE_PROVIDER_SITE_OTHER): Payer: Medicare Other | Admitting: Adult Health

## 2015-04-21 ENCOUNTER — Encounter: Payer: Self-pay | Admitting: Adult Health

## 2015-04-21 VITALS — BP 128/74 | HR 90 | Temp 98.5°F | Ht 66.0 in | Wt >= 6400 oz

## 2015-04-21 DIAGNOSIS — D869 Sarcoidosis, unspecified: Secondary | ICD-10-CM | POA: Diagnosis not present

## 2015-04-21 DIAGNOSIS — G4733 Obstructive sleep apnea (adult) (pediatric): Secondary | ICD-10-CM

## 2015-04-21 NOTE — Patient Instructions (Signed)
Decrease Prednisone 20mg  alternating 10mg  daily for 1 week then 10mg  daily and hold at this dose.  Follow up with Rheumatology next month as planned .  Follow up with Dr. Elsworth Soho  In 6 weeks and As needed   Please contact office for sooner follow up if symptoms do not improve or worsen or seek emergency care

## 2015-04-21 NOTE — Progress Notes (Signed)
Reviewed & agree with plan  

## 2015-04-21 NOTE — Assessment & Plan Note (Addendum)
BL infiltrates & hilar and mediastinal lymphadenopathy , presumed sarcoid (although TBBX neg ) with skin & joint involvement Pt with recent flare , now improving with steroid burst.  ANA remains positive with high titer, referred back to rheumatology .  CXR today -pt declines , will try to get at next office visit.   Plan  Decrease Prednisone 20mg  alternating 10mg  daily for 1 week then 10mg  daily and hold at this dose.  Follow up with Rheumatology next month as planned .  Follow up with Dr. Elsworth Soho  In 6 weeks and As needed   Please contact office for sooner follow up if symptoms do not improve or worsen or seek emergency care

## 2015-04-21 NOTE — Assessment & Plan Note (Signed)
Cont on CPAP At bedtime  

## 2015-04-21 NOTE — Progress Notes (Signed)
Subjective:    Patient ID: [name], female    DOB: 12-28-1965, 49 y.o.   MRN: [id number]  HPI  [name]   49 yo morbidly obese woman with OSA & BL infiltrates & hilar and mediastinal lymphadenopathy , presumed sarcoid (although TBBX neg ) with skin & joint involvement  Significant tests/ events:  First seen in hospital 01/26/11  for acute dyspnea and hypoxia found to have OHS and OSA with recs for continuous O2 ( 2 L/m at rest & 4L/m on walking)and Nocturnal CPAP  Echo- mild LVH , EF 55-60% . VQ scan intermed prob , doppler neg. CT chest  neg for PE.  CPAP titration 03/2011 (wt 479 lbs) -CPAP 7 cm to stop snoring, No desaturation on O2 2 L/min   09/2011 - Hosp adm for hemoptysis & BL infiltrates on CT,p-anca pos 1.80, gbm neg, ANA neg, ESR 42  Labs c/w fe def anemia, UA pos blood (was having periods) >> rpt UA no RBCs  Underwent endometrial ablation for menorrhagia -unsuccessful  08/2012 admitted for atypical chest pain, and shortness of breath.  CT chest was negative for PE, notable, hilar and mediastinal lymphadenopathy and cardiomegaly  C-ANCA 1:40, ANA 1:320 , homogenous  ACE level 21   10/02/2012 TBBX >> no granulomas, BAL neg afb, fungal  12/2012 -Was seen by Dr. [name] at [place]. repeat labs>> neg pANCA and cANCA ,ESR 48 and neg ANA , low ACE level    CT chest 12/2013 >decreased hilar /mediastinal adenopathy-(done 1 week after steroids started ) >>>Pred decreased to 6m daily   CT chest 04/24/14 - Scattered pulmonary opacities with ground-glass densities, clustered reticulonodular densities and interstitial thickening  06/12/14  Patient reports previous biopsy of, arm, rash, by dermatology was consistent with sun exposure dermatitis.   10/2014 PFTs-FVC 55%, DLCO 44%    03/09/15 increased joint pain and chest tightness - on prednisone 117mevery other day - Increased to 1056maily , today she took 20 mg because she was having  severe joint pains in the morning  Adm 12/2014 for acute resp failure secondary to CHF and PNA superimposed upon underlying OSA/OHS, sarcoidosis, and anemia -required 2 units PRBC for hemoglobin 6.7 CT angiogram chest negative for pulmonary embolus-Revealed scattered  opacities bilateral  She has been on and off prednisone since 12/2013-started for skin rash and joint pains  -more compliant with O2  >steroid burst and rhuematology referral .    04/21/2015 Follow up :  BL infiltrates & hilar and mediastinal lymphadenopathy , presumed sarcoid (although TBBX neg ) with skin & joint involvement Pt returns for 4 week follow up Sarcoid and OSA.  Reports breathing is back to baseline Seen last ov with flare of severe joint pain, rash and increased DOE. She was started on steroid burst with taper to  32m33m. She is  Feeling so much better.  Labs showed ANA remains positive with titer 1:1280.  Has seen Rheumatology in past (~2103) for possible Sarcoid/pulmonary infiltrates  and positive ANA /c-ANCA  She was referred back to rheumatology and has office visit in July.  JOint pain is improved and Rash is gone  Currently on prednisone 32mg80mly  No fever, chest pain, orthopnea, increased edema, orthopnea, discolored mucus.  Doing well on CPAP , no new issues .   Review of Systems neg for any significant sore throat, dysphagia, itching, sneezing, nasal congestion or excess/ purulent secretions, fever, chills, sweats, unintended wt loss, pleuritic or exertional  cp, hempoptysis, orthopnea pnd or change in chronic leg swelling. Also denies presyncope, palpitations, heartburn, abdominal pain, nausea, vomiting, diarrhea or change in bowel or urinary habits, dysuria,hematuria, rash, arthralgias, visual complaints, headache, numbness weakness or ataxia.     Objective:   Physical Exam  Gen. Pleasant, obese, in no distress, normal affect ENT - no lesions, no post nasal drip, class 2-3 airway Neck: No JVD, no  thyromegaly, no carotid bruits Lungs: no use of accessory muscles, no dullness to percussion, decreased without rales or rhonchi  Cardiovascular: Rhythm regular, heart sounds  normal, no murmurs or gallops, no peripheral edema Abdomen: soft and non-tender, no hepatosplenomegaly, BS normal. Musculoskeletal: No deformities, no cyanosis or clubbing Neuro:  alert, non focal, no tremors       Assessment & Plan:

## 2015-06-03 ENCOUNTER — Ambulatory Visit: Payer: Medicare Other | Admitting: Pulmonary Disease

## 2015-06-12 ENCOUNTER — Encounter: Payer: Self-pay | Admitting: Adult Health

## 2015-06-12 ENCOUNTER — Ambulatory Visit (INDEPENDENT_AMBULATORY_CARE_PROVIDER_SITE_OTHER): Payer: Medicare Other | Admitting: Adult Health

## 2015-06-12 VITALS — BP 138/80 | Temp 98.5°F | Ht 66.0 in | Wt >= 6400 oz

## 2015-06-12 DIAGNOSIS — R739 Hyperglycemia, unspecified: Secondary | ICD-10-CM | POA: Diagnosis not present

## 2015-06-12 DIAGNOSIS — I10 Essential (primary) hypertension: Secondary | ICD-10-CM | POA: Diagnosis not present

## 2015-06-12 DIAGNOSIS — E038 Other specified hypothyroidism: Secondary | ICD-10-CM

## 2015-06-12 DIAGNOSIS — Z0000 Encounter for general adult medical examination without abnormal findings: Secondary | ICD-10-CM

## 2015-06-12 LAB — BASIC METABOLIC PANEL
BUN: 14 mg/dL (ref 6–23)
CALCIUM: 9.2 mg/dL (ref 8.4–10.5)
CHLORIDE: 97 meq/L (ref 96–112)
CO2: 30 meq/L (ref 19–32)
CREATININE: 0.68 mg/dL (ref 0.40–1.20)
GFR: 117.93 mL/min (ref >60.00)
GLUCOSE: 108 mg/dL — AB (ref 70–99)
Potassium: 4.4 mEq/L (ref 3.5–5.1)
Sodium: 137 mEq/L (ref 135–145)

## 2015-06-12 LAB — CBC WITH DIFFERENTIAL/PLATELET
BASOS ABS: 0 10*3/uL (ref 0.0–0.1)
Basophils Relative: 0.1 % (ref 0.0–3.0)
EOS ABS: 0.1 10*3/uL (ref 0.0–0.7)
Eosinophils Relative: 0.6 % (ref 0.0–5.0)
HCT: 32.3 % — ABNORMAL LOW (ref 36.0–46.0)
Hemoglobin: 9.2 g/dL — ABNORMAL LOW (ref 12.0–15.0)
LYMPHS PCT: 10.3 % — AB (ref 12.0–46.0)
Lymphs Abs: 1 10*3/uL (ref 0.7–4.0)
MCV: 62.5 fl — ABNORMAL LOW (ref 78.0–100.0)
Monocytes Absolute: 0.7 10*3/uL (ref 0.1–1.0)
Monocytes Relative: 6.5 % (ref 3.0–12.0)
NEUTROS PCT: 82.5 % — AB (ref 43.0–77.0)
Neutro Abs: 8.4 10*3/uL — ABNORMAL HIGH (ref 1.4–7.7)
PLATELETS: 241 10*3/uL (ref 150.0–400.0)
RDW: 24.8 % — ABNORMAL HIGH (ref 11.5–15.5)
WBC: 10.2 10*3/uL (ref 4.0–10.5)

## 2015-06-12 LAB — LIPID PANEL
Cholesterol: 204 mg/dL — ABNORMAL HIGH (ref 0–200)
HDL: 50.6 mg/dL (ref >39.00)
LDL Cholesterol: 134 mg/dL — ABNORMAL HIGH (ref 0–99)
NONHDL: 152.98
Total CHOL/HDL Ratio: 4
Triglycerides: 93 mg/dL (ref 0.0–149.0)
VLDL: 18.6 mg/dL (ref 0.0–40.0)

## 2015-06-12 LAB — HEPATIC FUNCTION PANEL
ALT: 11 U/L (ref 0–35)
AST: 14 U/L (ref 0–37)
Albumin: 3.7 g/dL (ref 3.5–5.2)
Alkaline Phosphatase: 43 U/L (ref 39–117)
BILIRUBIN TOTAL: 0.6 mg/dL (ref 0.2–1.2)
Bilirubin, Direct: 0.1 mg/dL (ref 0.0–0.3)
TOTAL PROTEIN: 8.2 g/dL (ref 6.0–8.3)

## 2015-06-12 LAB — TSH: TSH: 2.9 u[IU]/mL (ref 0.35–4.50)

## 2015-06-12 LAB — HEMOGLOBIN A1C: Hgb A1c MFr Bld: 5.4 % (ref 4.6–6.5)

## 2015-06-12 NOTE — Patient Instructions (Signed)
It was great seeing you again!  I will follow up with you regarding your blood work.   Please continue to eat healthy and exercise as much as you can.   Follow up in six months for recheck.

## 2015-06-12 NOTE — Progress Notes (Signed)
Subjective:    Patient ID: [name], female    DOB: 1966-03-29, 49 y.o.   MRN: [id number]  HPI  49 year old pleasant female who is morbidly obese and  has a past medical history of Hypertension; Hypothyroidism; Anemia; Morbid obesity; Gallstones; Seasonal allergies; Chronic hyperventilation syndrome; Shortness of breath; Sleep apnea; GERD (gastroesophageal reflux disease); H/O hiatal hernia; Arthritis; Pneumonia; COPD (chronic obstructive pulmonary disease); Sarcoidosis; and Kidney stones.Presents to the office today for her CPE. She does not endorse any acute complaints at this time.  She follows up with Pulmonology, Cardiology and Urology as needed for her chronic issues.   She needs to make an appointment with her Gynologist and to have mammogram done.She had adnormal pap in 2013 and was referred to oncology but she never went.   Her weight is up 14 pounds since last visit 2 months ago. She endorses that this is from Pulmonology increasing her prednisone dose. She has since gone back to her original amount.   She continues to endorse eating a healthy diet but does not exercise due to Sarcoidosis.   Wt Readings from Last 3 Encounters:  06/12/15 488 lb (221.355 kg)  04/21/15 474 lb (215.005 kg)  04/14/15 466 lb 12.8 oz (211.739 kg)    Review of Systems  Constitutional: Negative.   HENT: Negative.   Respiratory: Positive for shortness of breath (chronic). Negative for cough, chest tightness and wheezing.   Cardiovascular: Negative.   Gastrointestinal: Negative.   Musculoskeletal: Positive for back pain, arthralgias and gait problem.  Skin: Negative.   Neurological: Negative.   Hematological: Negative.   Psychiatric/Behavioral: Negative.   All other systems reviewed and are negative.  Past Medical History  Diagnosis Date  . Hypertension   . Hypothyroidism   . Anemia   . Morbid obesity   . Gallstones   . Seasonal allergies   . Chronic hyperventilation syndrome      w/ obesity tx with albuterol inhaler and oxygen 2L  . Shortness of breath   . Sleep apnea     uses CPAP machine   . GERD (gastroesophageal reflux disease)     diet controlled - no meds  . H/O hiatal hernia   . Arthritis     hands, shoulders, no meds  . Pneumonia     hoispitalized in 08/2011  . COPD (chronic obstructive pulmonary disease)     uses oxygen 2 L  . Sarcoidosis   . Kidney stones     Social History   Social History  . Marital Status: Single    Spouse Name: N/A  . Number of Children: N/A  . Years of Education: N/A   Occupational History  . unemployeed    Social History Main Topics  . Smoking status: Never Smoker   . Smokeless tobacco: Never Used  . Alcohol Use: Yes     Comment: occasionally  . Drug Use: No  . Sexual Activity: No   Other Topics Concern  . Not on file   Social History Narrative   Is not currently working.    Divorced for eight or nine years   Has one son who lives around here.        Past Surgical History  Procedure Laterality Date  . Cholecystectomy  2000  . Cesarean section  1994    x 1  . I and d of abcess  05/2011  . Hysteroscopy w/d&c  12/27/2011    Procedure: DILATATION AND CURETTAGE /HYSTEROSCOPY;  Surgeon: Baxter Flattery  Linwood Dibbles, MD;  Location: [facility];  Service: Gynecology;;  . Lung biopsy    . Uterine abletion      Family History  Problem Relation Age of Onset  . Diabetes Father   . Diabetes Brother   . Deep vein thrombosis Mother   . Aneurysm Sister     d/o brain aneurysm    No Known Allergies  Current Outpatient Prescriptions on File Prior to Visit  Medication Sig Dispense Refill  . albuterol (PROVENTIL HFA;VENTOLIN HFA) 108 (90 BASE) MCG/ACT inhaler Inhale 1-2 puffs into the lungs every 6 (six) hours as needed for wheezing. 1 Inhaler 0  . cetirizine (ZYRTEC) 10 MG tablet Take 10 mg by mouth at bedtime as needed for allergies.    . cholecalciferol (VITAMIN D) 400 UNITS TABS tablet Take 400 Units by mouth daily.    .  Coral Calcium 1000 (390 CA) MG TABS Take 1 tablet by mouth daily.    . ferrous sulfate 325 (65 FE) MG tablet Take 1 tablet (325 mg total) by mouth 3 (three) times daily with meals. 90 tablet 0  . furosemide (LASIX) 40 MG tablet Take 1 tablet (40 mg total) by mouth daily. 30 tablet 1  . HYDROcodone-acetaminophen (NORCO/VICODIN) 5-325 MG per tablet Take 1 tablet by mouth every 6 (six) hours as needed for moderate pain.    Marland Kitchen levothyroxine (SYNTHROID, LEVOTHROID) 100 MCG tablet Take 1 tablet (100 mcg total) by mouth daily at 6 (six) AM. 30 tablet 4  . losartan (COZAAR) 50 MG tablet TAKE 1 TABLET (50 MG TOTAL) BY MOUTH DAILY. 30 tablet 1  . methocarbamol (ROBAXIN) 500 MG tablet Take 500 mg by mouth every 6 (six) hours as needed for muscle spasms.    . metoprolol succinate (TOPROL-XL) 25 MG 24 hr tablet TAKE 1 TABLET (25 MG TOTAL) BY MOUTH DAILY. 30 tablet 1  . NON FORMULARY 2 liter of oxygen    . predniSONE (DELTASONE) 10 MG tablet Take 1 tablet (10 mg total) by mouth every other day. 30 tablet 0  . triamcinolone acetonide (KENALOG) 40 MG/ML injection Inject 40 mg into the muscle every 3 (three) months. For knee pain    . triamcinolone ointment (KENALOG) 0.1 % Apply 1 application topically 2 (two) times daily. Once or twice weekly as needed for break out of Sarcoidosis    . ALPRAZolam (XANAX) 1 MG tablet Take 1 tablet (1 mg total) by mouth 2 (two) times daily as needed for anxiety. (Patient not taking: Reported on 06/12/2015) 20 tablet 0   No current facility-administered medications on file prior to visit.    BP 138/80 mmHg  Temp(Src) 98.5 F (36.9 C) (Oral)  Ht 5\' 6"  (1.676 m)  Wt 488 lb (221.355 kg)  BMI 78.80 kg/m2       Objective:   Physical Exam  Constitutional: She is oriented to person, place, and time. She appears well-developed and well-nourished. No distress.  Morbidly obese  HENT:  Head: Normocephalic and atraumatic.  Right Ear: External ear normal.  Left Ear: External ear  normal.  Nose: Nose normal.  Mouth/Throat: Oropharynx is clear and moist.  Eyes: Conjunctivae and EOM are normal. Pupils are equal, round, and reactive to light. Right eye exhibits no discharge. Left eye exhibits no discharge. No scleral icterus.  Neck: Normal range of motion. Neck supple. No thyromegaly present.  Cardiovascular: Normal rate, regular rhythm, normal heart sounds and intact distal pulses.  Exam reveals no gallop and no friction rub.  No murmur heard. Pulmonary/Chest: Effort normal and breath sounds normal. No respiratory distress. She has no wheezes. She has no rales. She exhibits no tenderness.  On 2L via  becomes SOB with mild exertion  Abdominal: Soft. Bowel sounds are normal. She exhibits no distension. There is no tenderness. There is no rebound and no guarding.  Genitourinary:  Refused by patient.   Musculoskeletal: She exhibits edema (bilateral lower extremities). She exhibits no tenderness.  Lymphadenopathy:    She has no cervical adenopathy.  Neurological: She is alert and oriented to person, place, and time. She has normal reflexes. No cranial nerve deficit. Coordination normal.  Skin: Skin is warm and dry. No rash noted. She is not diaphoretic. No erythema. No pallor.  Psychiatric: She has a normal mood and affect. Her behavior is normal. Judgment and thought content normal.  Nursing note and vitals reviewed.      Assessment & Plan:  1. Essential hypertension - Blood pressure controlled -no change - Basic metabolic panel - CBC with Differential/Platelet - Hemoglobin A1c - Hepatic function panel - Lipid panel - TSH - POCT urinalysis dipstick  2. Other specified hypothyroidism - Currently on synthroid [id number]  - Basic metabolic panel - CBC with Differential/Platelet - Hepatic function panel - Lipid panel - TSH - POCT urinalysis dipstick  3. Morbid obesity - Discussed the use phentermine with patient to help her with weight loss, she refused that  medication at this time.  - Also discussed consulting for bariatric surgery. She would like to wait and see how her son does with bariatric surgery before she decides to go through with it.  - Stressed the importance of exercise and healthy diet.  - Basic metabolic panel - CBC with Differential/Platelet - Hemoglobin A1c - Hepatic function panel - Lipid panel - TSH  4. Hyperglycemia - Basic metabolic panel - CBC with Differential/Platelet - Hemoglobin A1c - Hepatic function panel - Lipid panel - TSH - POCT urinalysis dipstick

## 2015-06-12 NOTE — Progress Notes (Signed)
Pre visit review using our clinic review tool, if applicable. No additional management support is needed unless otherwise documented below in the visit note. 

## 2015-06-16 ENCOUNTER — Telehealth: Payer: Self-pay | Admitting: Adult Health

## 2015-06-16 ENCOUNTER — Other Ambulatory Visit: Payer: Self-pay | Admitting: Family

## 2015-06-16 NOTE — Telephone Encounter (Signed)
Spoke to patient and informed her of her lab results. She continues to have an iron deficiency anemia and is taking PO iron. Will continue to watch. She has no symptoms

## 2015-06-30 ENCOUNTER — Telehealth: Payer: Self-pay | Admitting: Pulmonary Disease

## 2015-06-30 DIAGNOSIS — D869 Sarcoidosis, unspecified: Secondary | ICD-10-CM
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
## 2015-06-30 NOTE — Telephone Encounter (Signed)
Referral sent for [name] at [facility]. Patient notified. Nothing further needed.

## 2015-06-30 NOTE — Telephone Encounter (Signed)
OK to refer.

## 2015-07-03 ENCOUNTER — Ambulatory Visit: Payer: Medicare Other | Admitting: Adult Health

## 2015-07-07 ENCOUNTER — Telehealth: Payer: Self-pay | Admitting: Adult Health

## 2015-07-07 NOTE — Telephone Encounter (Signed)
Patient Name: [name] ON DOB: 07-03-1966 Initial Comment Caller states she think her hemoglobin has failed. Having shortness of breath Nurse Assessment Nurse: Marcelline Deist, RN, Kermit Balo Date/Time (Eastern Time): 07/07/2015 11:05:27 AM Confirm and document reason for call. If symptomatic, describe symptoms. ---Caller states she thinks her hemoglobin has failed. Having shortness of breath when she walks or exerts herself. Has happened before, but wanted to address it before it got worse. Recently had a long period. Has tightness in the lung area. Has the patient traveled out of the country within the last 30 days? ---Not Applicable Does the patient require triage? ---Yes Related visit to physician within the last 2 weeks? ---No Does the PT have any chronic conditions? (i.e. diabetes, asthma, etc.) ---Yes List chronic conditions. ---on O2, thyroid, sarcoid of the lung, on BP rx.,,RA possibility Did the patient indicate they were pregnant? ---No Guidelines Guideline Title Affirmed Question Affirmed Notes Breathing Difficulty [1] MILD difficulty breathing (e.g., minimal/no SOB at rest, SOB with walking, pulse <100) AND [2] NEW-onset or WORSE than normal Final Disposition User See Physician within 4 Hours (or PCP triage) Marcelline Deist, RN, Lynda Comments Caller is indecisive about who she needs to see, her PCP or pulmonary specialist. She feels she would be using too much of her energy to be seen in the office, not sure what they would do anyway. Thinks she may just go to the ER as the last time her HGB dropped, they had to transfuse her. Referrals [facility] - ED Disagree/Comply: Comply

## 2015-07-07 NOTE — Telephone Encounter (Signed)
Called and spoke with pt and pt is aware. Pt is going to wait until her son gets off work and then she will go.

## 2015-07-07 NOTE — Telephone Encounter (Signed)
She should go to the ER if she is that short of breath

## 2015-07-08 ENCOUNTER — Inpatient Hospital Stay (HOSPITAL_COMMUNITY)
Admission: EM | Admit: 2015-07-08 | Discharge: 2015-07-11 | DRG: 291 | Disposition: A | Discharge status: Home / Self Care | Payer: Medicare Other | Attending: Internal Medicine | Admitting: Internal Medicine

## 2015-07-08 ENCOUNTER — Encounter (HOSPITAL_COMMUNITY): Payer: Self-pay | Admitting: *Deleted

## 2015-07-08 ENCOUNTER — Emergency Department (HOSPITAL_COMMUNITY): Payer: Medicare Other

## 2015-07-08 DIAGNOSIS — I10 Essential (primary) hypertension: Secondary | ICD-10-CM | POA: Diagnosis not present

## 2015-07-08 DIAGNOSIS — Z9981 Dependence on supplemental oxygen: Secondary | ICD-10-CM

## 2015-07-08 DIAGNOSIS — Z7952 Long term (current) use of systemic steroids: Secondary | ICD-10-CM

## 2015-07-08 DIAGNOSIS — D869 Sarcoidosis, unspecified: Secondary | ICD-10-CM | POA: Diagnosis present

## 2015-07-08 DIAGNOSIS — R0600 Dyspnea, unspecified: Secondary | ICD-10-CM

## 2015-07-08 DIAGNOSIS — Z6841 Body Mass Index (BMI) 40.0 and over, adult: Secondary | ICD-10-CM

## 2015-07-08 DIAGNOSIS — M1990 Unspecified osteoarthritis, unspecified site: Secondary | ICD-10-CM | POA: Diagnosis present

## 2015-07-08 DIAGNOSIS — D500 Iron deficiency anemia secondary to blood loss (chronic): Secondary | ICD-10-CM | POA: Diagnosis present

## 2015-07-08 DIAGNOSIS — K219 Gastro-esophageal reflux disease without esophagitis: Secondary | ICD-10-CM | POA: Diagnosis present

## 2015-07-08 DIAGNOSIS — I509 Heart failure, unspecified: Secondary | ICD-10-CM

## 2015-07-08 DIAGNOSIS — J9621 Acute and chronic respiratory failure with hypoxia: Secondary | ICD-10-CM | POA: Diagnosis present

## 2015-07-08 DIAGNOSIS — Z833 Family history of diabetes mellitus: Secondary | ICD-10-CM

## 2015-07-08 DIAGNOSIS — E039 Hypothyroidism, unspecified: Secondary | ICD-10-CM | POA: Diagnosis present

## 2015-07-08 DIAGNOSIS — J449 Chronic obstructive pulmonary disease, unspecified: Secondary | ICD-10-CM | POA: Diagnosis present

## 2015-07-08 DIAGNOSIS — D649 Anemia, unspecified: Secondary | ICD-10-CM

## 2015-07-08 DIAGNOSIS — E785 Hyperlipidemia, unspecified: Secondary | ICD-10-CM | POA: Diagnosis present

## 2015-07-08 DIAGNOSIS — G4733 Obstructive sleep apnea (adult) (pediatric): Secondary | ICD-10-CM | POA: Diagnosis not present

## 2015-07-08 DIAGNOSIS — R0602 Shortness of breath: Secondary | ICD-10-CM | POA: Diagnosis not present

## 2015-07-08 DIAGNOSIS — I5033 Acute on chronic diastolic (congestive) heart failure: Principal | ICD-10-CM | POA: Diagnosis present

## 2015-07-08 LAB — COMPREHENSIVE METABOLIC PANEL
ALBUMIN: 3.8 g/dL (ref 3.5–5.0)
ALK PHOS: 38 U/L (ref 38–126)
ALT: 12 U/L — ABNORMAL LOW (ref 14–54)
ANION GAP: 9 (ref 5–15)
AST: 18 U/L (ref 15–41)
BUN: 13 mg/dL (ref 6–20)
CO2: 30 mmol/L (ref 22–32)
Calcium: 9.1 mg/dL (ref 8.9–10.3)
Chloride: 100 mmol/L — ABNORMAL LOW (ref 101–111)
Creatinine, Ser: 0.55 mg/dL (ref 0.44–1.00)
GFR calc Af Amer: >60 mL/min (ref >60)
GFR calc non Af Amer: >60 mL/min (ref >60)
GLUCOSE: 111 mg/dL — AB (ref 65–99)
POTASSIUM: 4.2 mmol/L (ref 3.5–5.1)
SODIUM: 139 mmol/L (ref 135–145)
Total Bilirubin: 0.7 mg/dL (ref 0.3–1.2)
Total Protein: 8.1 g/dL (ref 6.5–8.1)

## 2015-07-08 LAB — CBC WITH DIFFERENTIAL/PLATELET
BASOS PCT: 0 % (ref 0–1)
Basophils Absolute: 0 10*3/uL (ref 0.0–0.1)
EOS PCT: 0 % (ref 0–5)
Eosinophils Absolute: 0 10*3/uL (ref 0.0–0.7)
HEMATOCRIT: 31.6 % — AB (ref 36.0–46.0)
HEMOGLOBIN: 8.2 g/dL — AB (ref 12.0–15.0)
LYMPHS PCT: 7 % — AB (ref 12–46)
Lymphs Abs: 0.7 10*3/uL (ref 0.7–4.0)
MCH: 16.6 pg — AB (ref 26.0–34.0)
MCHC: 25.9 g/dL — ABNORMAL LOW (ref 30.0–36.0)
MCV: 63.8 fL — AB (ref 78.0–100.0)
Monocytes Absolute: 0.6 10*3/uL (ref 0.1–1.0)
Monocytes Relative: 6 % (ref 3–12)
NEUTROS PCT: 87 % — AB (ref 43–77)
Neutro Abs: 8.7 10*3/uL — ABNORMAL HIGH (ref 1.7–7.7)
Platelets: 282 10*3/uL (ref 150–400)
RBC: 4.95 MIL/uL (ref 3.87–5.11)
RDW: 22.8 % — ABNORMAL HIGH (ref 11.5–15.5)
WBC: 10 10*3/uL (ref 4.0–10.5)

## 2015-07-08 LAB — TYPE AND SCREEN
ABO/RH(D): A POS
ANTIBODY SCREEN: NEGATIVE

## 2015-07-08 LAB — PROTIME-INR
INR: 1.06 (ref 0.00–1.49)
Prothrombin Time: 14 seconds (ref 11.6–15.2)

## 2015-07-08 LAB — TROPONIN I: Troponin I: <0.03 ng/mL (ref <0.031)

## 2015-07-08 MED ORDER — TRIAMCINOLONE ACETONIDE 0.1 % EX OINT
1.0000 "application " | TOPICAL_OINTMENT | Freq: Two times a day (BID) | CUTANEOUS | Status: DC
  Administered 2015-07-09 – 2015-07-11 (×3): 1 via TOPICAL
  Filled 2015-07-08: qty 15

## 2015-07-08 MED ORDER — INFLUENZA VAC SPLIT QUAD 0.5 ML IM SUSY
0.5000 mL | PREFILLED_SYRINGE | INTRAMUSCULAR | Status: AC | PRN
  Administered 2015-07-11: 0.5 mL via INTRAMUSCULAR
  Filled 2015-07-08 (×2): qty 0.5

## 2015-07-08 MED ORDER — FERROUS SULFATE 325 (65 FE) MG PO TABS
325.0000 mg | ORAL_TABLET | Freq: Three times a day (TID) | ORAL | Status: DC
  Administered 2015-07-08 – 2015-07-11 (×9): 325 mg via ORAL
  Filled 2015-07-08 (×9): qty 1

## 2015-07-08 MED ORDER — PREDNISONE 20 MG PO TABS
20.0000 mg | ORAL_TABLET | Freq: Every day | ORAL | Status: DC
  Administered 2015-07-08 – 2015-07-10 (×3): 20 mg via ORAL
  Filled 2015-07-08 (×3): qty 1

## 2015-07-08 MED ORDER — METOPROLOL SUCCINATE ER 25 MG PO TB24
25.0000 mg | ORAL_TABLET | Freq: Every day | ORAL | Status: DC
  Administered 2015-07-08 – 2015-07-11 (×4): 25 mg via ORAL
  Filled 2015-07-08 (×4): qty 1

## 2015-07-08 MED ORDER — LOSARTAN POTASSIUM 50 MG PO TABS
50.0000 mg | ORAL_TABLET | Freq: Every day | ORAL | Status: DC
  Administered 2015-07-08 – 2015-07-11 (×4): 50 mg via ORAL
  Filled 2015-07-08 (×4): qty 1

## 2015-07-08 MED ORDER — ACETAMINOPHEN 325 MG PO TABS
650.0000 mg | ORAL_TABLET | Freq: Four times a day (QID) | ORAL | Status: DC | PRN
  Administered 2015-07-09: 650 mg via ORAL
  Filled 2015-07-08: qty 2

## 2015-07-08 MED ORDER — FUROSEMIDE 10 MG/ML IJ SOLN
40.0000 mg | Freq: Two times a day (BID) | INTRAMUSCULAR | Status: DC
  Administered 2015-07-08 – 2015-07-10 (×4): 40 mg via INTRAVENOUS
  Filled 2015-07-08 (×3): qty 4

## 2015-07-08 MED ORDER — LORATADINE 10 MG PO TABS: 10.0000 mg | ORAL_TABLET | Freq: Every day | ORAL | Status: DC | PRN

## 2015-07-08 MED ORDER — FUROSEMIDE 10 MG/ML IJ SOLN
20.0000 mg | Freq: Once | INTRAMUSCULAR | Status: DC
  Filled 2015-07-08: qty 4

## 2015-07-08 MED ORDER — SODIUM CHLORIDE 0.9 % IJ SOLN
3.0000 mL | Freq: Two times a day (BID) | INTRAMUSCULAR | Status: DC
  Administered 2015-07-08 – 2015-07-11 (×6): 3 mL via INTRAVENOUS

## 2015-07-08 MED ORDER — ENOXAPARIN SODIUM 40 MG/0.4ML ~~LOC~~ SOLN
40.0000 mg | Freq: Two times a day (BID) | SUBCUTANEOUS | Status: DC
  Administered 2015-07-08 – 2015-07-11 (×6): 40 mg via SUBCUTANEOUS
  Filled 2015-07-08 (×6): qty 0.4

## 2015-07-08 MED ORDER — ACETAMINOPHEN 650 MG RE SUPP: 650.0000 mg | Freq: Four times a day (QID) | RECTAL | Status: DC | PRN

## 2015-07-08 MED ORDER — ALBUTEROL SULFATE (2.5 MG/3ML) 0.083% IN NEBU: 2.5000 mg | INHALATION_SOLUTION | RESPIRATORY_TRACT | Status: DC | PRN

## 2015-07-08 MED ORDER — METHYLPREDNISOLONE SODIUM SUCC 125 MG IJ SOLR
125.0000 mg | Freq: Once | INTRAMUSCULAR | Status: AC
  Administered 2015-07-08: 125 mg via INTRAVENOUS
  Filled 2015-07-08: qty 2

## 2015-07-08 MED ORDER — GUAIFENESIN-DM 100-10 MG/5ML PO SYRP: 5.0000 mL | ORAL_SOLUTION | ORAL | Status: DC | PRN

## 2015-07-08 MED ORDER — IPRATROPIUM-ALBUTEROL 0.5-2.5 (3) MG/3ML IN SOLN
3.0000 mL | Freq: Once | RESPIRATORY_TRACT | Status: AC
  Administered 2015-07-08: 3 mL via RESPIRATORY_TRACT
  Filled 2015-07-08: qty 3

## 2015-07-08 MED ORDER — LEVOTHYROXINE SODIUM 100 MCG PO TABS
100.0000 ug | ORAL_TABLET | Freq: Every day | ORAL | Status: DC
  Administered 2015-07-09 – 2015-07-11 (×3): 100 ug via ORAL
  Filled 2015-07-08 (×3): qty 1

## 2015-07-08 MED ORDER — HYDROCODONE-ACETAMINOPHEN 5-325 MG PO TABS: 1.0000 | ORAL_TABLET | ORAL | Status: DC | PRN

## 2015-07-08 NOTE — ED Notes (Signed)
Spoke to Ron on the floor and he will call and get biatric bed.  He said it will be 20 minutes

## 2015-07-08 NOTE — ED Notes (Signed)
Pt can go to floor at 14:50

## 2015-07-08 NOTE — ED Provider Notes (Signed)
CSN: [id number]     Arrival date & time 07/08/15  1004 History   First MD Initiated Contact with Patient 07/08/15 1016     Chief Complaint  Patient presents with  . Shortness of Breath     (Consider location/radiation/quality/duration/timing/severity/associated sxs/prior Treatment) HPI Patient is chronically on 2 L of oxygen with a history of sarcoidosis. She reports for about 2 days now she has been getting increasingly short of breath particularly noting this with exertion. She has not noticed any swelling of her lower extremities that would be different from baseline. She denies chest pain. She reports she has been having some nighttime cough but no fever or sputum production. She reports the last time she had similar symptoms, she was very anemic. She does report she recently finished a menstrual cycle that started and stopped once. She reports she is not bleeding at this time. She states that on her 2 liters of nasal cannula she does get low oxygen readings with exertion down to the 70s. Past Medical History  Diagnosis Date  . Hypertension   . Hypothyroidism   . Anemia   . Morbid obesity   . Gallstones   . Seasonal allergies   . Chronic hyperventilation syndrome     w/ obesity tx with albuterol inhaler and oxygen 2L  . Shortness of breath   . Sleep apnea     uses CPAP machine   . GERD (gastroesophageal reflux disease)     diet controlled - no meds  . H/O hiatal hernia   . Arthritis     hands, shoulders, no meds  . Pneumonia     hoispitalized in 08/2011  . COPD (chronic obstructive pulmonary disease)     uses oxygen 2 L  . Sarcoidosis   . Kidney stones    Past Surgical History  Procedure Laterality Date  . Cholecystectomy  2000  . Cesarean section  1994    x 1  . I and d of abcess  05/2011  . Hysteroscopy w/d&c  12/27/2011    Procedure: DILATATION AND CURETTAGE /HYSTEROSCOPY;  Surgeon: Maeola Sarah. Landry Mellow, MD;  Location: [facility];  Service: Gynecology;;  . Lung biopsy    .  Uterine abletion     Family History  Problem Relation Age of Onset  . Diabetes Father   . Diabetes Brother   . Deep vein thrombosis Mother   . Aneurysm Sister     d/o brain aneurysm   Social History  Substance Use Topics  . Smoking status: Never Smoker   . Smokeless tobacco: Never Used  . Alcohol Use: Yes     Comment: occasionally   OB History    No data available     Review of Systems 10 Systems reviewed and are negative for acute change except as noted in the HPI.    Allergies  Review of patient's allergies indicates no known allergies.  Home Medications   Prior to Admission medications   Medication Sig Start Date End Date Taking? Authorizing Provider  albuterol (PROVENTIL HFA;VENTOLIN HFA) 108 (90 BASE) MCG/ACT inhaler Inhale 1-2 puffs into the lungs every 6 (six) hours as needed for wheezing. 12/14/13  Yes Tanna Furry, MD  cetirizine (ZYRTEC) 10 MG tablet Take 10 mg by mouth at bedtime as needed for allergies.   Yes Historical Provider, MD  cholecalciferol (VITAMIN D) 400 UNITS TABS tablet Take 400 Units by mouth daily.   Yes Historical Provider, MD  Coral Calcium 1000 (390 CA) MG TABS Take 1  tablet by mouth daily.   Yes Historical Provider, MD  ferrous sulfate 325 (65 FE) MG tablet Take 1 tablet (325 mg total) by mouth 3 (three) times daily with meals. 12/13/13  Yes Kennyth Arnold, FNP  furosemide (LASIX) 40 MG tablet Take 1 tablet (40 mg total) by mouth daily. 01/19/15  Yes Orson Eva, MD  HYDROcodone-acetaminophen (NORCO/VICODIN) 5-325 MG per tablet Take 1 tablet by mouth every 6 (six) hours as needed for moderate pain.   Yes Historical Provider, MD  levothyroxine (SYNTHROID, LEVOTHROID) 100 MCG tablet Take 1 tablet (100 mcg total) by mouth daily at 6 (six) AM. 09/16/13  Yes Kennyth Arnold, FNP  losartan (COZAAR) 50 MG tablet TAKE 1 TABLET (50 MG TOTAL) BY MOUTH DAILY. 06/16/15  Yes [name], NP  methocarbamol (ROBAXIN) 500 MG tablet Take 500 mg by mouth every 6 (six)  hours as needed for muscle spasms.   Yes Historical Provider, MD  metoprolol succinate (TOPROL-XL) 25 MG 24 hr tablet TAKE 1 TABLET (25 MG TOTAL) BY MOUTH DAILY. 06/16/15  Yes [name], NP  NON FORMULARY 2 liter of oxygen   Yes Historical Provider, MD  predniSONE (DELTASONE) 10 MG tablet Take 1 tablet (10 mg total) by mouth every other day. Patient taking differently: Take 20 mg by mouth at bedtime.  01/19/15  Yes Orson Eva, MD  triamcinolone acetonide (KENALOG) 40 MG/ML injection Inject 40 mg into the muscle every 3 (three) months. For knee pain   Yes Historical Provider, MD  triamcinolone ointment (KENALOG) 0.1 % Apply 1 application topically 2 (two) times daily. Once or twice weekly as needed for break out of Sarcoidosis   Yes Historical Provider, MD  ALPRAZolam Duanne Moron) 1 MG tablet Take 1 tablet (1 mg total) by mouth 2 (two) times daily as needed for anxiety. Patient not taking: Reported on 06/12/2015 04/14/15   [name], NP   BP 159/67 mmHg  Pulse 80  Temp(Src) 98.1 F (36.7 C) (Oral)  Resp 16  SpO2 100%  LMP 06/24/2015 Physical Exam  Constitutional: She is oriented to person, place, and time.  Patient is morbidly obese. She is sitting upright in a wheelchair. Her general appearance is good. She is alert and in no acute respiratory distress at rest.  HENT:  Head: Normocephalic and atraumatic.  Eyes: EOM are normal. Pupils are equal, round, and reactive to light.  Neck: Neck supple.  Cardiovascular: Normal rate, regular rhythm, normal heart sounds and intact distal pulses.   Pulmonary/Chest: Effort normal and breath sounds normal.  Abdominal: Soft. Bowel sounds are normal. She exhibits no distension. There is no tenderness.  Musculoskeletal:  Patient has morbid obesity of the lower extremities. It is difficult to discern specifically edema. The legs are generally very turgid with pannus rolls even in the lower legs.  Neurological: She is alert and oriented to person, place, and  time. She has normal strength. Coordination normal. GCS eye subscore is 4. GCS verbal subscore is 5. GCS motor subscore is 6.  Skin: Skin is warm, dry and intact.  Psychiatric: She has a normal mood and affect.    ED Course  Procedures (including critical care time) Labs Review Labs Reviewed  COMPREHENSIVE METABOLIC PANEL - Abnormal; Notable for the following:    Chloride 100 (*)    Glucose, Bld 111 (*)    ALT 12 (*)    All other components within normal limits  CBC WITH DIFFERENTIAL/PLATELET - Abnormal; Notable for the following:    Hemoglobin 8.2 (*)  HCT 31.6 (*)    MCV 63.8 (*)    MCH 16.6 (*)    MCHC 25.9 (*)    RDW 22.8 (*)    Neutrophils Relative % 87 (*)    Lymphocytes Relative 7 (*)    Neutro Abs 8.7 (*)    All other components within normal limits  TROPONIN I  PROTIME-INR  TYPE AND SCREEN    Imaging Review Dg Chest Port 1 View  07/08/2015   CLINICAL DATA:  Shortness of Breath  EXAM: PORTABLE CHEST - 1 VIEW  COMPARISON:  March 17 chest radiograph and chest CT  FINDINGS: There is patchy interstitial edema in the bases, somewhat more on the right than on the left. Mild edema is also noted in the right mid lung. There is no frank airspace consolidation. Heart is enlarged. The pulmonary vascularity is within normal limits. No adenopathy. No bone lesions.  IMPRESSION: Findings felt to represent a degree of congestive heart failure. Underlying interstitial pneumonia cannot be excluded ; both entities may exist concurrently. No airspace consolidation.   Electronically Signed   By: Lowella Grip III M.D.   On: 07/08/2015 11:11   I have personally reviewed and evaluated these images and lab results as part of my medical decision-making.   EKG Interpretation   Date/Time:  Wednesday July 08 2015 11:07:53 EDT Ventricular Rate:  81 PR Interval:  142 QRS Duration: 89 QT Interval:  378 QTC Calculation: 439 R Axis:   77 Text Interpretation:  Sinus rhythm Confirmed by  Johnney Killian, MD, Jeannie Done  [phone]) on 07/08/2015 2:22:05 PM      MDM   Final diagnoses:  Dyspnea  Sarcoidosis  CHF exacerbation  Morbid obesity  Anemia, unspecified anemia type   Patient presents with increasing exertional dyspnea for several days. She has been noting increasing swelling in her legs. She has been getting improvement with Lasix and elevating his legs. Her dyspnea however has been worsening for 2 days. Now with minimal exertion she reports becoming very short of breath. No significant associated chest pain. Patient does have history sarcoidosis. She reports nighttime cough but not fever or sputum production. At this time my suspicion is for a CHF exacerbation with underlying significant medical comorbidities of morbid obesity with some element of hypoventilation syndrome and acute CHF exacerbation. Patient also notes a prior history of an episode of anemia with significant dyspnea. At this time anemia is not at a critical level requiring immediate transfusion but is appropriate for continued monitoring.    Charlesetta Shanks, MD 07/08/15 [id number]

## 2015-07-08 NOTE — ED Notes (Signed)
Pt sent from MD office due to increased shortness of breath with activity x 1 week; pt short of breath on arrival; sats 88% off of oxygen; 93% on patient normal 2l per ; pt denies any pain at present; pt pleasant and smiling at time of triage

## 2015-07-08 NOTE — H&P (Signed)
History and Physical  [name] DGU:[id number] DOB: 02-23-1966 DOA: 07/08/2015  Referring physician: Dr. Charlesetta Shanks, EDP PCP: [name], NP  Outpatient Specialists:  1. Pulmonology: Dr. Kara Mead 2. Cardiology: Dr. Candee Furbish 3. Rheumatology: Dr. Gavin Pound  Chief Complaint: Worsening dyspnea  HPI: [name] is a 49 y.o. female with history of presumed sarcoidosis (although transbronchial biopsy negative) with skin and joint involvement-on chronic steroids, morbid obesity, OSA/OHS on nightly CPAP, chronic hypoxic respiratory failure on home O2 2 L/m continuously, HTN, HLD, chronic diastolic CHF on when necessary Lasix, GERD, hypothyroid, iron deficiency anemia, presented to the [facility] ED on 07/08/15 with complaints of worsening dyspnea. She was in her usual state of health until approximately 1 week ago when she started noticing dyspnea on ambulating to the bathroom which was new for her. This has progressively gotten worse. She denies orthopnea, PND or chest pain. She has mild intermittent dry cough without fever or chills. She noticed worsening leg edema. She claims to have daily for about 30-40 pounds weight since June 2016. She apparently had a flareup of sarcoid in June at which time her prednisone dose was increased and she has gradually tapered down to current dose of 20 mg daily. She used Lasix on 2 days in the last week with good urine output but has not continued the same. She denies cyclic contacts or recent travel. She does complain of postnasal drip. In the ED, lab work significant for hemoglobin 8.2, MCV 63.8, troponin 1 negative, chest x-ray suggesting CHF. She received a dose of IV Solu-Medrol and started on IV Lasix. Hospitalist admission was requested. Patient states that she is starting to feel better.   Review of Systems: All systems reviewed and apart from history of presenting illness, are negative.  Past Medical History    Diagnosis Date  . Hypertension   . Hypothyroidism   . Anemia   . Morbid obesity   . Gallstones   . Seasonal allergies   . Chronic hyperventilation syndrome     w/ obesity tx with albuterol inhaler and oxygen 2L  . Shortness of breath   . Sleep apnea     uses CPAP machine   . GERD (gastroesophageal reflux disease)     diet controlled - no meds  . H/O hiatal hernia   . Arthritis     hands, shoulders, no meds  . Pneumonia     hoispitalized in 08/2011  . COPD (chronic obstructive pulmonary disease)     uses oxygen 2 L  . Sarcoidosis   . Kidney stones    Past Surgical History  Procedure Laterality Date  . Cholecystectomy  2000  . Cesarean section  1994    x 1  . I and d of abcess  05/2011  . Hysteroscopy w/d&c  12/27/2011    Procedure: DILATATION AND CURETTAGE /HYSTEROSCOPY;  Surgeon: Maeola Sarah. Landry Mellow, MD;  Location: [facility];  Service: Gynecology;;  . Lung biopsy    . Uterine abletion     Social History:  reports that she has never smoked. She has never used smokeless tobacco. She reports that she drinks alcohol. She reports that she does not use illicit drugs. Divorced. Lives with son at home. Independent of activities of daily living. Drinks alcohol occasionally.  No Known Allergies  Family History  Problem Relation Age of Onset  . Diabetes Father   . Diabetes Brother   . Deep vein thrombosis Mother   . Aneurysm  Sister     d/o brain aneurysm    Prior to Admission medications   Medication Sig Start Date End Date Taking? Authorizing Provider  albuterol (PROVENTIL HFA;VENTOLIN HFA) 108 (90 BASE) MCG/ACT inhaler Inhale 1-2 puffs into the lungs every 6 (six) hours as needed for wheezing. 12/14/13  Yes Tanna Furry, MD  cetirizine (ZYRTEC) 10 MG tablet Take 10 mg by mouth at bedtime as needed for allergies.   Yes Historical Provider, MD  cholecalciferol (VITAMIN D) 400 UNITS TABS tablet Take 400 Units by mouth daily.   Yes Historical Provider, MD  Coral Calcium 1000 (390 CA) MG  TABS Take 1 tablet by mouth daily.   Yes Historical Provider, MD  ferrous sulfate 325 (65 FE) MG tablet Take 1 tablet (325 mg total) by mouth 3 (three) times daily with meals. 12/13/13  Yes Kennyth Arnold, FNP  furosemide (LASIX) 40 MG tablet Take 1 tablet (40 mg total) by mouth daily. 01/19/15  Yes Orson Eva, MD  HYDROcodone-acetaminophen (NORCO/VICODIN) 5-325 MG per tablet Take 1 tablet by mouth every 6 (six) hours as needed for moderate pain.   Yes Historical Provider, MD  levothyroxine (SYNTHROID, LEVOTHROID) 100 MCG tablet Take 1 tablet (100 mcg total) by mouth daily at 6 (six) AM. 09/16/13  Yes Kennyth Arnold, FNP  losartan (COZAAR) 50 MG tablet TAKE 1 TABLET (50 MG TOTAL) BY MOUTH DAILY. 06/16/15  Yes [name], NP  methocarbamol (ROBAXIN) 500 MG tablet Take 500 mg by mouth every 6 (six) hours as needed for muscle spasms.   Yes Historical Provider, MD  metoprolol succinate (TOPROL-XL) 25 MG 24 hr tablet TAKE 1 TABLET (25 MG TOTAL) BY MOUTH DAILY. 06/16/15  Yes [name], NP  NON FORMULARY 2 liter of oxygen   Yes Historical Provider, MD  predniSONE (DELTASONE) 10 MG tablet Take 1 tablet (10 mg total) by mouth every other day. Patient taking differently: Take 20 mg by mouth at bedtime.  01/19/15  Yes Orson Eva, MD  triamcinolone acetonide (KENALOG) 40 MG/ML injection Inject 40 mg into the muscle every 3 (three) months. For knee pain   Yes Historical Provider, MD  triamcinolone ointment (KENALOG) 0.1 % Apply 1 application topically 2 (two) times daily. Once or twice weekly as needed for break out of Sarcoidosis   Yes Historical Provider, MD   Physical Exam: Filed Vitals:   07/08/15 1011 07/08/15 1015 07/08/15 1227 07/08/15 1420  BP: 155/75  159/67   Pulse: 93  80   Temp:  98.5 F (36.9 C) 98.1 F (36.7 C)   TempSrc:   Oral   Resp: 28  16   SpO2: 93%  100% 97%     General exam: Moderately built and morbidly obese female patient, sitting up comfortably on the wheelchair in ED without  discomfort.   Head, eyes and ENT: Nontraumatic and normocephalic. Pupils equally reacting to light and accommodation. Oral mucosa moist.  Neck: Supple. No JVD, carotid bruit or thyromegaly.  Lymphatics: No lymphadenopathy.  Respiratory system: Slightly diminished breath sounds in the bases with occasional basal crackles. Rest of the lung fields are clear to auscultation. No increased work of breathing. Able to speak in full sentences.  Cardiovascular system: S1 and S2 heard, RRR. No JVD, murmurs, gallops, clicks or pedal edema.  Gastrointestinal system: Abdomen is nondistended, soft and nontender. Normal bowel sounds heard. No organomegaly or masses appreciated.  Central nervous system: Alert and oriented. No focal neurological deficits.  Extremities: Symmetric 5 x 5 power. Peripheral  pulses symmetrically felt. Trace bilateral leg edema-appears to have chronic skin changes from chronic edema.  Skin: No rashes or acute findings.  Musculoskeletal system: Negative exam.  Psychiatry: Pleasant and cooperative.   Labs on Admission:  Basic Metabolic Panel:  Recent Labs Lab 07/08/15 1116  NA 139  K 4.2  CL 100*  CO2 30  GLUCOSE 111*  BUN 13  CREATININE 0.55  CALCIUM 9.1   Liver Function Tests:  Recent Labs Lab 07/08/15 1116  AST 18  ALT 12*  ALKPHOS 38  BILITOT 0.7  PROT 8.1  ALBUMIN 3.8   No results for input(s): LIPASE, AMYLASE in the last 168 hours. No results for input(s): AMMONIA in the last 168 hours. CBC:  Recent Labs Lab 07/08/15 1116  WBC 10.0  NEUTROABS 8.7*  HGB 8.2*  HCT 31.6*  MCV 63.8*  PLT 282   Cardiac Enzymes:  Recent Labs Lab 07/08/15 1116  TROPONINI <0.03    BNP (last 3 results) No results for input(s): PROBNP in the last 8760 hours. CBG: No results for input(s): GLUCAP in the last 168 hours.  Radiological Exams on Admission: Dg Chest Port 1 View  07/08/2015   CLINICAL DATA:  Shortness of Breath  EXAM: PORTABLE CHEST - 1 VIEW   COMPARISON:  March 17 chest radiograph and chest CT  FINDINGS: There is patchy interstitial edema in the bases, somewhat more on the right than on the left. Mild edema is also noted in the right mid lung. There is no frank airspace consolidation. Heart is enlarged. The pulmonary vascularity is within normal limits. No adenopathy. No bone lesions.  IMPRESSION: Findings felt to represent a degree of congestive heart failure. Underlying interstitial pneumonia cannot be excluded ; both entities may exist concurrently. No airspace consolidation.   Electronically Signed   By: Lowella Grip III M.D.   On: 07/08/2015 11:11    EKG: Independently reviewed. Sinus rhythm without acute abnormalities. QTC 439 ms.  Assessment/Plan Principal Problem:   Diastolic CHF, acute on chronic Active Problems:   OSA (obstructive sleep apnea)   Hypertension   Sarcoidosis   Iron deficiency anemia due to chronic blood loss   Hypothyroidism   Morbid obesity   Anemia   Acute on chronic respiratory failure with hypoxia   Diastolic CHF, acute on chronic - Start IV Lasix 40 mg 2 times daily. Strict intake output and daily weights. Monitor clinically. - 2-D echo 01/16/15: Mild LVH. LVEF 60-65 percent.  Acute on chronic hypoxic respiratory failure - Secondary to decompensated CHF complicating underlying OSA/OHS - Treat CHF as above. Continue oxygen and nightly CPAP  OSA/OHS - Nightly CPAP  Essential hypertension - Continue home dose of metoprolol XL and ARB. Mildly uncontrolled.  Sarcoidosis - Patient received a dose of IV Solu-Medrol in ED. Lung findings not impressive for bronchospasm. Continue home dose of prednisone. - Patient missed an appointment today to see new rheumatologist Dr. Lenna Gilford due to ED visit.  Iron deficiency anemia due to chronic blood loss - Baseline hemoglobin may be in the 9 g range. Appears that she is not fully compliant with her iron supplements. No overt GI bleeding reported. Continue  iron supplements and follow CBC. May need transfusion if she drops significantly below 8 g per DL. In the past anemia has precipitated her CHF according to report.  Hypothyroid - Continue Synthroid    DVT prophylaxis: Lovenox  Code Status: Full   Family Communication: None at bedside   Disposition Plan: DC home when medically  stable, possibly in 2-3 days   Time spent: 5 minutes   HONGALGI,ANAND, MD, FACP, FHM. [facility] Pager [phone]  If 7PM-7AM, please contact night-coverage [email or web address] Password [email or web address] 07/08/2015, 3:17 PM

## 2015-07-08 NOTE — ED Notes (Signed)
Spoke to Ron on floor and he will have a bariatric bed in 20 minutes

## 2015-07-08 NOTE — Progress Notes (Signed)
Pt not ready for cpap at this time. Will call RT to set up when ready. CPAP left at bedside.

## 2015-07-09 DIAGNOSIS — I10 Essential (primary) hypertension: Secondary | ICD-10-CM

## 2015-07-09 LAB — CBC
HEMATOCRIT: 31.6 % — AB (ref 36.0–46.0)
Hemoglobin: 8.3 g/dL — ABNORMAL LOW (ref 12.0–15.0)
MCH: 16.6 pg — ABNORMAL LOW (ref 26.0–34.0)
MCHC: 26.3 g/dL — AB (ref 30.0–36.0)
MCV: 63.2 fL — AB (ref 78.0–100.0)
PLATELETS: 290 10*3/uL (ref 150–400)
RBC: 5 MIL/uL (ref 3.87–5.11)
RDW: 23.2 % — AB (ref 11.5–15.5)
WBC: 10.7 10*3/uL — ABNORMAL HIGH (ref 4.0–10.5)

## 2015-07-09 LAB — BASIC METABOLIC PANEL
Anion gap: 11 (ref 5–15)
BUN: 17 mg/dL (ref 6–20)
CHLORIDE: 99 mmol/L — AB (ref 101–111)
CO2: 29 mmol/L (ref 22–32)
CREATININE: 0.76 mg/dL (ref 0.44–1.00)
Calcium: 9.1 mg/dL (ref 8.9–10.3)
GFR calc Af Amer: >60 mL/min (ref >60)
GLUCOSE: 149 mg/dL — AB (ref 65–99)
POTASSIUM: 4.2 mmol/L (ref 3.5–5.1)
SODIUM: 139 mmol/L (ref 135–145)

## 2015-07-09 MED ORDER — HYDRALAZINE HCL 20 MG/ML IJ SOLN: 10.0000 mg | Freq: Four times a day (QID) | INTRAMUSCULAR | Status: DC | PRN

## 2015-07-09 NOTE — Progress Notes (Signed)
PROGRESS NOTE    [name]:[id number] DOB: Jul 03, 1966 DOA: 07/08/2015 PCP: [name], NP  HPI/Brief narrative 49 y.o. female with history of presumed sarcoidosis (although transbronchial biopsy negative) with skin and joint involvement-on chronic steroids, morbid obesity, OSA/OHS on nightly CPAP, chronic hypoxic respiratory failure on home O2 2 L/m continuously, HTN, HLD, chronic diastolic CHF on when necessary Lasix, GERD, hypothyroid, iron deficiency anemia, presented to the [facility] ED on 07/08/15 with complaints of worsening dyspnea. Admitted for possible decompensated CHF Mx.   Assessment/Plan:  Diastolic CHF, acute on chronic - Started IV Lasix 40 mg 2 times daily. Strict intake output and daily weights. Monitor clinically. - 2-D echo 01/16/15: Mild LVH. LVEF 60-65 percent. - Patient diuresing well. Intake and output charting is incomplete and inaccurate. 4 pounds weight loss over last 24 hours. Continue management.  Acute on chronic hypoxic respiratory failure - Secondary to decompensated CHF complicating underlying OSA/OHS - Treat CHF as above. Continue oxygen and nightly CPAP - Subjectively better. Hypoxic with activity  OSA/OHS - Nightly CPAP  Essential hypertension - Continue home dose of metoprolol XL and ARB. Mildly uncontrolled. Add when necessary IV hydralazine. May consider uptitrating medication doses.  Sarcoidosis - Patient received a dose of IV Solu-Medrol in ED. Lung findings not impressive for bronchospasm. Continue home dose of prednisone. - Patient missed an appointment today to see new rheumatologist Dr. Lenna Gilford due to ED visit.  Iron deficiency anemia due to chronic blood loss - Baseline hemoglobin may be in the 9 g range. Appears that she is not fully compliant with her iron supplements. No overt GI bleeding reported. Continue iron supplements and follow CBC. May need transfusion if she drops significantly below 8 g per DL. In the  past anemia has precipitated her CHF according to report. Stable.  Hypothyroid - Continue Synthroid   DVT prophylaxis: Lovenox  Code Status: Full  Family Communication: None at bedside  Disposition Plan: DC home when medically stable, possibly in 2-3 days    Consultants:  None  Procedures:  None  Antibiotics:  None  Subjective: Dyspnea better. Urinating well. No other complaints reported  Objective: Filed Vitals:   07/09/15 1236 07/09/15 1241 07/09/15 1245 07/09/15 1400  BP:    142/76  Pulse:    83  Temp:    98.2 F (36.8 C)  TempSrc:    Oral  Resp:    20  Height:      Weight:      SpO2: 73% 100% 100% 98%    Intake/Output Summary (Last 24 hours) at 07/09/15 1847 Last data filed at 07/09/15 1700  Gross per 24 hour  Intake    518 ml  Output   1200 ml  Net   -682 ml   Filed Weights   07/08/15 1548 07/08/15 1734 07/09/15 0456  Weight: 221.355 kg (488 lb) 226.458 kg (499 lb 4 oz) 224.712 kg (495 lb 6.4 oz)     Exam:  General exam: Moderately obese female sitting up comfortably in bed Respiratory system: Clear. No increased work of breathing. Cardiovascular system: S1 & S2 heard, RRR. No JVD, murmurs, gallops, clicks. Chronic bilateral leg edema. Not on telemetry despite ordered on admission-requested RN to place on telemetry. Gastrointestinal system: Abdomen is nondistended, soft and nontender. Normal bowel sounds heard. Central nervous system: Alert and oriented. No focal neurological deficits. Extremities: Symmetric 5 x 5 power.   Data Reviewed: Basic Metabolic Panel:  Recent Labs Lab 07/08/15 1116 07/09/15 0447  NA 139 139  K 4.2 4.2  CL 100* 99*  CO2 30 29  GLUCOSE 111* 149*  BUN 13 17  CREATININE 0.55 0.76  CALCIUM 9.1 9.1   Liver Function Tests:  Recent Labs Lab 07/08/15 1116  AST 18  ALT 12*  ALKPHOS 38  BILITOT 0.7  PROT 8.1  ALBUMIN 3.8   No results for input(s): LIPASE, AMYLASE in the last 168 hours. No results  for input(s): AMMONIA in the last 168 hours. CBC:  Recent Labs Lab 07/08/15 1116 07/09/15 0447  WBC 10.0 10.7*  NEUTROABS 8.7*  --   HGB 8.2* 8.3*  HCT 31.6* 31.6*  MCV 63.8* 63.2*  PLT 282 290   Cardiac Enzymes:  Recent Labs Lab 07/08/15 1116  TROPONINI <0.03   BNP (last 3 results) No results for input(s): PROBNP in the last 8760 hours. CBG: No results for input(s): GLUCAP in the last 168 hours.  No results found for this or any previous visit (from the past 240 hour(s)).       Studies: Dg Chest Port 1 View  07/08/2015   CLINICAL DATA:  Shortness of Breath  EXAM: PORTABLE CHEST - 1 VIEW  COMPARISON:  March 17 chest radiograph and chest CT  FINDINGS: There is patchy interstitial edema in the bases, somewhat more on the right than on the left. Mild edema is also noted in the right mid lung. There is no frank airspace consolidation. Heart is enlarged. The pulmonary vascularity is within normal limits. No adenopathy. No bone lesions.  IMPRESSION: Findings felt to represent a degree of congestive heart failure. Underlying interstitial pneumonia cannot be excluded ; both entities may exist concurrently. No airspace consolidation.   Electronically Signed   By: Lowella Grip III M.D.   On: 07/08/2015 11:11        Scheduled Meds: . enoxaparin (LOVENOX) injection  40 mg Subcutaneous Q12H  . ferrous sulfate  325 mg Oral TID WC  . furosemide  40 mg Intravenous BID  . levothyroxine  100 mcg Oral QAC breakfast  . losartan  50 mg Oral Daily  . metoprolol succinate  25 mg Oral Daily  . predniSONE  20 mg Oral QHS  . sodium chloride  3 mL Intravenous Q12H  . triamcinolone ointment  1 application Topical BID   Continuous Infusions:   Principal Problem:   Diastolic CHF, acute on chronic Active Problems:   OSA (obstructive sleep apnea)   Hypertension   Sarcoidosis   Iron deficiency anemia due to chronic blood loss   Hypothyroidism   Morbid obesity   Anemia   Acute on  chronic respiratory failure with hypoxia    Time spent: 30 minutes    Chinelo Benn, MD, FACP, FHM. [facility] Pager [phone]  If 7PM-7AM, please contact night-coverage [email or web address] Password [facility] 07/09/2015, 6:47 PM    LOS: 1 day

## 2015-07-09 NOTE — Progress Notes (Signed)
SATURATION QUALIFICATIONS: (This note is used to comply with regulatory documentation for home oxygen)  Patient Saturations on Room Air at Rest = 100%  Patient Saturations on2 Liters of oxygen while Ambulating = 83%  Please briefly explain why patient needs home oxygen:

## 2015-07-09 NOTE — Progress Notes (Signed)
PT Cancellation Note  Patient Details Name: [name] MRN: [id number] DOB: May 16, 1966   Cancelled Treatment:    Reason Eval/Treat Not Completed: PT screened, no needs identified, will sign off. Spoke with RN who reported pt is able to mobilize/perform ADLs without assistance. Will sign off at this time.    Weston Anna, MPT Pager: [phone]

## 2015-07-09 NOTE — Progress Notes (Signed)
RT placed patient on CPAP. Patient setting is auto 4-16 cmH2O. Sterile water was added to water chamber for humidification. Patient is tolerating well. RT will continue to monitor.

## 2015-07-09 NOTE — Progress Notes (Signed)
Ambulate 120ft,  O2 Sats dropped to 73% post ambulation on 2 Lnc

## 2015-07-09 NOTE — Progress Notes (Signed)
Pt stated that her son was bringing her CPAP machine from home for her to use tonight.  Pt asked RT to remove hospital provided machine from her room.  Pt stated that if she needed assistance from RT she would call.  RT notified Pt that [facility] would be notified to inspect machine.  Rt to monitor and assess as needed.

## 2015-07-10 LAB — BASIC METABOLIC PANEL
Anion gap: 8 (ref 5–15)
BUN: 24 mg/dL — ABNORMAL HIGH (ref 6–20)
CALCIUM: 8.9 mg/dL (ref 8.9–10.3)
CO2: 32 mmol/L (ref 22–32)
CREATININE: 0.87 mg/dL (ref 0.44–1.00)
Chloride: 100 mmol/L — ABNORMAL LOW (ref 101–111)
GFR calc non Af Amer: >60 mL/min (ref >60)
Glucose, Bld: 135 mg/dL — ABNORMAL HIGH (ref 65–99)
Potassium: 4.4 mmol/L (ref 3.5–5.1)
SODIUM: 140 mmol/L (ref 135–145)

## 2015-07-10 MED ORDER — FUROSEMIDE 10 MG/ML IJ SOLN
40.0000 mg | Freq: Once | INTRAMUSCULAR | Status: AC
Start: 2015-07-10 — End: 2015-07-10
  Administered 2015-07-10: 40 mg via INTRAVENOUS
  Filled 2015-07-10: qty 4

## 2015-07-10 MED ORDER — FUROSEMIDE 10 MG/ML IJ SOLN
60.0000 mg | Freq: Two times a day (BID) | INTRAMUSCULAR | Status: DC
  Administered 2015-07-10 – 2015-07-11 (×2): 60 mg via INTRAVENOUS
  Filled 2015-07-10 (×2): qty 6

## 2015-07-10 NOTE — Progress Notes (Signed)
RT requested that [facility] inspect home CPAP equipment (spoke with [place]).

## 2015-07-10 NOTE — Progress Notes (Signed)
Pt amb in halls on 2lnc. O2 sat dropped to 85%, recovered to 94% upon sitting down and resting within 30 seconds.

## 2015-07-10 NOTE — Care Management Important Message (Signed)
Important Message  Patient Details  Name: [name] MRN: [id number] Date of Birth: [birth year]   Medicare Important Message Given:  Yes-second notification given    Camillo Flaming 07/10/2015, 3:17 [place] Message  Patient Details  Name: [name] MRN: [id number] Date of Birth: 12/29/1965   Medicare Important Message Given:  Yes-second notification given    Camillo Flaming 07/10/2015, 3:17 PM

## 2015-07-10 NOTE — Progress Notes (Signed)
Pt. States she can place her cpap on herself when she is ready. RT informed pt. To notify if she needed any assistance. 

## 2015-07-10 NOTE — Progress Notes (Signed)
PROGRESS NOTE    [name] ZOX:[id number] DOB: 1966-10-05 DOA: 07/08/2015 PCP: [name], NP  HPI/Brief narrative 49 y.o. female with history of presumed sarcoidosis (although transbronchial biopsy negative) with skin and joint involvement-on chronic steroids, morbid obesity, OSA/OHS on nightly CPAP, chronic hypoxic respiratory failure on home O2 2 L/m continuously, HTN, HLD, chronic diastolic CHF on when necessary Lasix, GERD, hypothyroid, iron deficiency anemia, presented to the [facility] ED on 07/08/15 with complaints of worsening dyspnea. Admitted for possible decompensated CHF Mx.   Assessment/Plan:  Diastolic CHF, acute on chronic - Started IV Lasix 40 mg 2 times daily. Strict intake output and daily weights. Monitor clinically. - 2-D echo 01/16/15: Mild LVH. LVEF 60-65 percent. - Patient diuresing well. Intake and output charting is incomplete and may not be fully accurate. 7 pounds weight loss over since admission.  - -3.913 L since admission. Still appears volume overloaded and symptomatic with minimal activity. Provided additional dose of Lasix 40 mg IV 1 and increase Lasix to 60 mg twice a day. - Monitor.  Acute on chronic hypoxic respiratory failure - Secondary to decompensated CHF complicating underlying OSA/OHS - Treat CHF as above. Continue oxygen and nightly CPAP - Subjectively better. Hypoxic with activity even on oxygen  OSA/OHS - Nightly CPAP  Essential hypertension - Continue home dose of metoprolol XL and ARB. Mildly uncontrolled. Add when necessary IV hydralazine. May consider uptitrating medication doses.  Sarcoidosis - Patient received a dose of IV Solu-Medrol in ED. Lung findings not impressive for bronchospasm. Continue home dose of prednisone. - Patient missed an appointment today to see new rheumatologist Dr. Lenna Gilford due to ED visit.  Iron deficiency anemia due to chronic blood loss - Baseline hemoglobin may be in the 9 g range.  Appears that she is not fully compliant with her iron supplements. No overt GI bleeding reported. Continue iron supplements and follow CBC. May need transfusion if she drops significantly below 8 g per DL. In the past anemia has precipitated her CHF according to report. Stable.  Hypothyroid - Continue Synthroid   DVT prophylaxis: Lovenox  Code Status: Full  Family Communication: None at bedside  Disposition Plan: DC home when medically stable, possibly in 1-2 days    Consultants:  None  Procedures:  None  Antibiotics:  None  Subjective: Dyspnea better but still has DOE on walking from bed to bathroom. Urinating well. No other complaints reported  Objective: Filed Vitals:   07/09/15 1400 07/09/15 2048 07/10/15 0445 07/10/15 1004  BP: 142/76 150/78 156/76 117/67  Pulse: 83 72 75 74  Temp: 98.2 F (36.8 C) 98 F (36.7 C) 97.9 F (36.6 C)   TempSrc: Oral Oral Oral   Resp: 20 20 20    Height:      Weight:   223.442 kg (492 lb 9.6 oz)   SpO2: 98% 96% 94% 96%    Intake/Output Summary (Last 24 hours) at 07/10/15 1346 Last data filed at 07/10/15 1100  Gross per 24 hour  Intake    895 ml  Output   3951 ml  Net  -3056 ml   Filed Weights   07/08/15 1734 07/09/15 0456 07/10/15 0445  Weight: 226.458 kg (499 lb 4 oz) 224.712 kg (495 lb 6.4 oz) 223.442 kg (492 lb 9.6 oz)     Exam:  General exam: Moderately obese female just returned from toilet and visibly dyspneic on exertion. Respiratory system: Clear. No increased work of breathing. Cardiovascular system: S1 & S2 heard, RRR.  No JVD, murmurs, gallops, clicks. Chronic bilateral leg edema. Telemetry: Sinus rhythm.  Gastrointestinal system: Abdomen is nondistended, soft and nontender. Normal bowel sounds heard. Central nervous system: Alert and oriented. No focal neurological deficits. Extremities: Symmetric 5 x 5 power.   Data Reviewed: Basic Metabolic Panel:  Recent Labs Lab 07/08/15 1116 07/09/15 0447  07/10/15 0421  NA 139 139 140  K 4.2 4.2 4.4  CL 100* 99* 100*  CO2 30 29 32  GLUCOSE 111* 149* 135*  BUN 13 17 24*  CREATININE 0.55 0.76 0.87  CALCIUM 9.1 9.1 8.9   Liver Function Tests:  Recent Labs Lab 07/08/15 1116  AST 18  ALT 12*  ALKPHOS 38  BILITOT 0.7  PROT 8.1  ALBUMIN 3.8   No results for input(s): LIPASE, AMYLASE in the last 168 hours. No results for input(s): AMMONIA in the last 168 hours. CBC:  Recent Labs Lab 07/08/15 1116 07/09/15 0447  WBC 10.0 10.7*  NEUTROABS 8.7*  --   HGB 8.2* 8.3*  HCT 31.6* 31.6*  MCV 63.8* 63.2*  PLT 282 290   Cardiac Enzymes:  Recent Labs Lab 07/08/15 1116  TROPONINI <0.03   BNP (last 3 results) No results for input(s): PROBNP in the last 8760 hours. CBG: No results for input(s): GLUCAP in the last 168 hours.  No results found for this or any previous visit (from the past 240 hour(s)).       Studies: No results found.      Scheduled Meds: . enoxaparin (LOVENOX) injection  40 mg Subcutaneous Q12H  . ferrous sulfate  325 mg Oral TID WC  . furosemide  60 mg Intravenous BID  . levothyroxine  100 mcg Oral QAC breakfast  . losartan  50 mg Oral Daily  . metoprolol succinate  25 mg Oral Daily  . predniSONE  20 mg Oral QHS  . sodium chloride  3 mL Intravenous Q12H  . triamcinolone ointment  1 application Topical BID   Continuous Infusions:   Principal Problem:   Diastolic CHF, acute on chronic Active Problems:   OSA (obstructive sleep apnea)   Hypertension   Sarcoidosis   Iron deficiency anemia due to chronic blood loss   Hypothyroidism   Morbid obesity   Anemia   Acute on chronic respiratory failure with hypoxia    Time spent: 30 minutes    HONGALGI,ANAND, MD, FACP, FHM. [facility] Pager [phone]  If 7PM-7AM, please contact night-coverage [email or web address] Password [email or web address] 07/10/2015, 1:46 PM    LOS: 2 days

## 2015-07-11 DIAGNOSIS — I5033 Acute on chronic diastolic (congestive) heart failure: Secondary | ICD-10-CM | POA: Diagnosis not present

## 2015-07-11 DIAGNOSIS — D649 Anemia, unspecified: Secondary | ICD-10-CM

## 2015-07-11 LAB — BASIC METABOLIC PANEL
ANION GAP: 7 (ref 5–15)
BUN: 24 mg/dL — AB (ref 6–20)
CHLORIDE: 97 mmol/L — AB (ref 101–111)
CO2: 35 mmol/L — ABNORMAL HIGH (ref 22–32)
Calcium: 8.9 mg/dL (ref 8.9–10.3)
Creatinine, Ser: 0.7 mg/dL (ref 0.44–1.00)
GFR calc Af Amer: >60 mL/min (ref >60)
Glucose, Bld: 116 mg/dL — ABNORMAL HIGH (ref 65–99)
POTASSIUM: 4.1 mmol/L (ref 3.5–5.1)
SODIUM: 139 mmol/L (ref 135–145)

## 2015-07-11 MED ORDER — PREDNISONE 10 MG PO TABS: 20.0000 mg | ORAL_TABLET | Freq: Every day | ORAL | Status: DC

## 2015-07-11 NOTE — Discharge Summary (Signed)
Physician Discharge Summary  [name] TKP:[id number] DOB: 11/16/1965 DOA: 07/08/2015  PCP: [name], NP  Admit date: 07/08/2015 Discharge date: 07/11/2015  Time spent: Less than 30 minutes  Recommendations for Outpatient Follow-up:  1. [name], NP/PCP in one week with repeat labs (CBC & BMP) 2. Dr. Kara Mead, Pulmonology: Advised to keep prior appointment on 07/20/15 3. Dr. Candee Furbish, Cardiology 4. Advised to continue home oxygen at 2 L/m at rest and 3 L/m with activity or titrate as needed to maintain sats >88%. 5. Continue nightly CPAP  Discharge Diagnoses:  Principal Problem:   Diastolic CHF, acute on chronic Active Problems:   OSA (obstructive sleep apnea)   Hypertension   Sarcoidosis   Iron deficiency anemia due to chronic blood loss   Hypothyroidism   Morbid obesity   Anemia   Acute on chronic respiratory failure with hypoxia   Absolute anemia   Discharge Condition: Improved & Stable  Diet recommendation: Heart healthy diet  Filed Weights   07/09/15 0456 07/10/15 0445 07/11/15 0523  Weight: 224.712 kg (495 lb 6.4 oz) 223.442 kg (492 lb 9.6 oz) 221.446 kg (488 lb 3.2 oz)    History of present illness:  49 y.o. female with history of presumed sarcoidosis (although transbronchial biopsy negative) with skin and joint involvement-on chronic steroids, morbid obesity, OSA/OHS on nightly CPAP, chronic hypoxic respiratory failure on home O2 2 L/m continuously, HTN, HLD, chronic diastolic CHF on when necessary Lasix, GERD, hypothyroid, iron deficiency anemia, presented to the [facility] ED on 07/08/15 with complaints of worsening dyspnea. Admitted for possible decompensated CHF Mx.  Hospital Course:   Diastolic CHF, acute on chronic - Patient was diuresed with IV Lasix. She is -5952 L since admission and has lost 11 pounds weight since admission. Indicates that her breathing is back to baseline. As per report, was taking oral Lasix at home only  as needed. She is advised to take Lasix 40 mg daily and may use additional 40 mg as needed for worsening leg edema, dyspnea or weight gain and she verbalizes understanding. She will follow-up with PCP with repeat labs at which time her regimen can be further adjusted. - Improved  Acute on chronic hypoxic respiratory failure - Secondary to decompensated CHF complicating underlying OSA/OHS - Improved. Patient states that even at home, with activity on her home O2 2 L/m, she sometimes desaturates up to 85%. She has been advised to titrate up her oxygen with activity to maintain sats >88%. She verbalizes understanding. She has a pulse oximetry at home.  OSA/OHS - Nightly CPAP  Essential hypertension - Continue home dose of metoprolol XL and ARB. Mildly uncontrolled. May consider uptitrating medication doses during outpatient follow-up.  Sarcoidosis - Patient received a dose of IV Solu-Medrol in ED. Lung findings not impressive for bronchospasm. Continue home dose of prednisone. - Patient missed an appointment today to see new rheumatologist Dr. Lenna Gilford due to ED visit. - Continue prior home dose of prednisone at discharge.  Iron deficiency anemia due to chronic blood loss - Baseline hemoglobin may be in the 9 g range. Appears that she is not fully compliant with her iron supplements. No overt GI bleeding reported. Continue iron supplements and follow CBC. May need transfusion if she drops significantly below 8 g per DL. In the past anemia has precipitated her CHF according to report. Stable.  Hypothyroid - Continue Synthroid   Consultants:  None  Procedures:  None  Antibiotics:  None   Discharge Exam:  Complaints: States that she feels much better. Her breathing is back to baseline. Anxious and insistent on going home today. Denies any other complaints.  Filed Vitals:   07/10/15 1430 07/10/15 2113 07/11/15 0523 07/11/15 0924  BP: 152/63 123/60 140/57 129/65  Pulse: 78 75 68  84  Temp: 97.8 F (36.6 C) 97.3 F (36.3 C) 97.9 F (36.6 C)   TempSrc: Oral Oral Oral   Resp: 18 20 20 18   Height:      Weight:   221.446 kg (488 lb 3.2 oz)   SpO2: 98% 100% 100% 100%    General exam: Morbidly obese female sitting up comfortably in chair. Respiratory system: Clear. No increased work of breathing. Cardiovascular system: S1 & S2 heard, RRR. No JVD, murmurs, gallops, clicks. Chronic bilateral leg edema-decreasing. Gastrointestinal system: Abdomen is nondistended, soft and nontender. Normal bowel sounds heard. Central nervous system: Alert and oriented. No focal neurological deficits. Extremities: Symmetric 5 x 5 power.  Discharge Instructions      Discharge Instructions    (HEART FAILURE PATIENTS) Call MD:  Anytime you have any of the following symptoms: 1) 3 pound weight gain in 24 hours or 5 pounds in 1 week 2) shortness of breath, with or without a dry hacking cough 3) swelling in the hands, feet or stomach 4) if you have to sleep on extra pillows at night in order to breathe.    Complete by:  As directed      Call MD for:  difficulty breathing, headache or visual disturbances    Complete by:  As directed      Call MD for:  extreme fatigue    Complete by:  As directed      Call MD for:  hives    Complete by:  As directed      Call MD for:  persistant dizziness or light-headedness    Complete by:  As directed      Call MD for:  persistant nausea and vomiting    Complete by:  As directed      Call MD for:  severe uncontrolled pain    Complete by:  As directed      Call MD for:  temperature >100.4    Complete by:  As directed      Diet - low sodium heart healthy    Complete by:  As directed      Increase activity slowly    Complete by:  As directed             Medication List    TAKE these medications        albuterol 108 (90 BASE) MCG/ACT inhaler  Commonly known as:  PROVENTIL HFA;VENTOLIN HFA  Inhale 1-2 puffs into the lungs every 6 (six) hours  as needed for wheezing.     cetirizine 10 MG tablet  Commonly known as:  ZYRTEC  Take 10 mg by mouth at bedtime as needed for allergies.     cholecalciferol 400 UNITS Tabs tablet  Commonly known as:  VITAMIN D  Take 400 Units by mouth daily.     Coral Calcium 1000 (390 CA) MG Tabs  Take 1 tablet by mouth daily.     ferrous sulfate 325 (65 FE) MG tablet  Take 1 tablet (325 mg total) by mouth 3 (three) times daily with meals.     furosemide 40 MG tablet  Commonly known as:  LASIX  Take 1 tablet (40 mg total) by mouth daily.  HYDROcodone-acetaminophen 5-325 MG per tablet  Commonly known as:  NORCO/VICODIN  Take 1 tablet by mouth every 6 (six) hours as needed for moderate pain.     KENALOG 40 MG/ML injection  Generic drug:  triamcinolone acetonide  Inject 40 mg into the muscle every 3 (three) months. For knee pain     levothyroxine 100 MCG tablet  Commonly known as:  SYNTHROID, LEVOTHROID  Take 1 tablet (100 mcg total) by mouth daily at 6 (six) AM.     losartan 50 MG tablet  Commonly known as:  COZAAR  TAKE 1 TABLET (50 MG TOTAL) BY MOUTH DAILY.     methocarbamol 500 MG tablet  Commonly known as:  ROBAXIN  Take 500 mg by mouth every 6 (six) hours as needed for muscle spasms.     metoprolol succinate 25 MG 24 hr tablet  Commonly known as:  TOPROL-XL  TAKE 1 TABLET (25 MG TOTAL) BY MOUTH DAILY.     NON FORMULARY  2 liter of oxygen     predniSONE 10 MG tablet  Commonly known as:  DELTASONE  Take 2 tablets (20 mg total) by mouth at bedtime.     triamcinolone ointment 0.1 %  Commonly known as:  KENALOG  Apply 1 application topically 2 (two) times daily. Once or twice weekly as needed for break out of Sarcoidosis       Follow-up Information    Follow up with [name], NP. Schedule an appointment as soon as possible for a visit in 1 week.   Specialty:  Family Medicine   Why:  To be seen with repeat labs (CBC & BMP).   Contact information:   [place] [address] [phone]       Follow up with Rigoberto Noel., MD On 07/20/2015.   Specialty:  Pulmonary Disease   Why:  Keep previous appointment.   Contact information:   [address] [address] [phone]       Schedule an appointment as soon as possible for a visit with Candee Furbish, MD.   Specialty:  Cardiology   Contact information:   [address] [place] [address] [phone]       Schedule an appointment as soon as possible for a visit with Campbell Lerner, MD.   Specialty:  Rheumatology   Contact information:   [place] [address] [phone]        The results of significant diagnostics from this hospitalization (including imaging, microbiology, ancillary and laboratory) are listed below for reference.    Significant Diagnostic Studies: Dg Chest Port 1 View  07/08/2015   CLINICAL DATA:  Shortness of Breath  EXAM: PORTABLE CHEST - 1 VIEW  COMPARISON:  March 17 chest radiograph and chest CT  FINDINGS: There is patchy interstitial edema in the bases, somewhat more on the right than on the left. Mild edema is also noted in the right mid lung. There is no frank airspace consolidation. Heart is enlarged. The pulmonary vascularity is within normal limits. No adenopathy. No bone lesions.  IMPRESSION: Findings felt to represent a degree of congestive heart failure. Underlying interstitial pneumonia cannot be excluded ; both entities may exist concurrently. No airspace consolidation.   Electronically Signed   By: Lowella Grip III M.D.   On: 07/08/2015 11:11    Microbiology: No results found for this or any previous visit (from the past 240 hour(s)).   Labs:  Basic Metabolic Panel:  Recent Labs Lab 07/08/15 1116 07/09/15 0447 07/10/15 0421 07/11/15 0719  NA 139 139 140 139  K 4.2 4.2 4.4 4.1  CL 100* 99* 100* 97*  CO2 30 29 32 35*  GLUCOSE 111* 149* 135* 116*  BUN 13 17  24* 24*  CREATININE 0.55 0.76 0.87 0.70  CALCIUM 9.1 9.1 8.9 8.9   Liver Function Tests:  Recent Labs Lab 07/08/15 1116  AST 18  ALT 12*  ALKPHOS 38  BILITOT 0.7  PROT 8.1  ALBUMIN 3.8   No results for input(s): LIPASE, AMYLASE in the last 168 hours. No results for input(s): AMMONIA in the last 168 hours. CBC:  Recent Labs Lab 07/08/15 1116 07/09/15 0447  WBC 10.0 10.7*  NEUTROABS 8.7*  --   HGB 8.2* 8.3*  HCT 31.6* 31.6*  MCV 63.8* 63.2*  PLT 282 290   Cardiac Enzymes:  Recent Labs Lab 07/08/15 1116  TROPONINI <0.03   BNP: BNP (last 3 results)  Recent Labs  01/15/15 0148  BNP 33.1    ProBNP (last 3 results) No results for input(s): PROBNP in the last 8760 hours.  CBG: No results for input(s): GLUCAP in the last 168 hours.     Signed:  Vernell Leep, MD, FACP, FHM. [facility] Pager [phone]  If 7PM-7AM, please contact night-coverage [email or web address] Password [email or web address] 07/11/2015, 12:48 PM

## 2015-07-11 NOTE — Progress Notes (Signed)
D/C instructions reviewed w/ pt. Pt verbalizes understanding and all questions answered. Pt d/c in w/c by NT in stable condition to son's car. Pt in possession of d/c instructions and all personal belongings. Pt d/c off floor on 2lnc, son has her tank in the car.

## 2015-07-11 NOTE — Discharge Instructions (Signed)

## 2015-07-14 DIAGNOSIS — M1712 Unilateral primary osteoarthritis, left knee: Secondary | ICD-10-CM | POA: Diagnosis not present

## 2015-07-14 DIAGNOSIS — M1711 Unilateral primary osteoarthritis, right knee: Secondary | ICD-10-CM | POA: Diagnosis not present

## 2015-07-15 ENCOUNTER — Encounter: Payer: Self-pay | Admitting: Cardiology

## 2015-07-15 ENCOUNTER — Ambulatory Visit (INDEPENDENT_AMBULATORY_CARE_PROVIDER_SITE_OTHER): Payer: Medicare Other | Admitting: Cardiology

## 2015-07-15 DIAGNOSIS — E662 Morbid (severe) obesity with alveolar hypoventilation: Secondary | ICD-10-CM

## 2015-07-15 NOTE — Patient Instructions (Signed)
Medication Instructions:  The current medical regimen is effective;  continue present plan and medications.  Follow-Up: Follow up as needed.  Thank you for choosing [facility]!!     

## 2015-07-15 NOTE — Progress Notes (Addendum)
Cardiology Office Note   Date:  07/15/2015   ID:  [name], DOB August 26, 1966, MRN [id number]  PCP:  Kim Peng, NP  Cardiologist:   Kim Furbish, MD     History of Present Illness: [name] is a 49 y.o. female who presents for hospital follow-up evaluation. I saw her several years ago with equal cardiology. She saw Dr. Harrington Macias last here in the cardiology clinic. She is followed closely by pulmonary medicine, Dr. Elsworth Macias as well as [name], her primary provider.  She was diuresed during the hospitalization. She continues with maintenance furosemide 80 mg a day. Toprol 25 mg once a day. She is on chronic oxygen therapy. Morbid obesity, obesity hypoventilation syndrome.  Thankfully, her BNP was normal (no objective BNP evidence of heart failure). Her echo also showed normal ejection fraction.    Past Medical History  Diagnosis Date  . Hypertension   . Hypothyroidism   . Anemia   . Morbid obesity   . Gallstones   . Seasonal allergies   . Chronic hyperventilation syndrome     w/ obesity tx with albuterol inhaler and oxygen 2L  . Shortness of breath   . Sleep apnea     uses CPAP machine   . GERD (gastroesophageal reflux disease)     diet controlled - no meds  . H/O hiatal hernia   . Arthritis     hands, shoulders, no meds  . Pneumonia     hoispitalized in 08/2011  . COPD (chronic obstructive pulmonary disease)     uses oxygen 2 L  . Sarcoidosis   . Kidney stones     Past Surgical History  Procedure Laterality Date  . Cholecystectomy  2000  . Cesarean section  1994    x 1  . I and d of abcess  05/2011  . Hysteroscopy w/d&c  12/27/2011    Procedure: DILATATION AND CURETTAGE /HYSTEROSCOPY;  Surgeon: Maeola Sarah. Landry Mellow, MD;  Location: [facility];  Service: Gynecology;;  . Lung biopsy    . Uterine abletion       Current Outpatient Prescriptions  Medication Sig Dispense Refill  . albuterol (PROVENTIL HFA;VENTOLIN HFA) 108 (90 BASE) MCG/ACT inhaler  Inhale 1-2 puffs into the lungs every 6 (six) hours as needed for wheezing. 1 Inhaler 0  . cetirizine (ZYRTEC) 10 MG tablet Take 10 mg by mouth at bedtime as needed for allergies.    . cholecalciferol (VITAMIN D) 400 UNITS TABS tablet Take 400 Units by mouth daily.    . Coral Calcium 1000 (390 CA) MG TABS Take 1 tablet by mouth daily.    . ferrous sulfate 325 (65 FE) MG tablet Take 1 tablet (325 mg total) by mouth 3 (three) times daily with meals. 90 tablet 0  . furosemide (LASIX) 40 MG tablet Take 1 tablet (40 mg total) by mouth daily. 30 tablet 1  . HYDROcodone-acetaminophen (NORCO/VICODIN) 5-325 MG per tablet Take 1 tablet by mouth every 6 (six) hours as needed for moderate pain.    Marland Kitchen levothyroxine (SYNTHROID, LEVOTHROID) 100 MCG tablet Take 1 tablet (100 mcg total) by mouth daily at 6 (six) AM. 30 tablet 4  . losartan (COZAAR) 50 MG tablet TAKE 1 TABLET (50 MG TOTAL) BY MOUTH DAILY. 30 tablet 1  . methocarbamol (ROBAXIN) 500 MG tablet Take 500 mg by mouth every 6 (six) hours as needed for muscle spasms.    . metoprolol succinate (TOPROL-XL) 25 MG 24 hr tablet TAKE 1 TABLET (25  MG TOTAL) BY MOUTH DAILY. 30 tablet 1  . NON FORMULARY 2 liter of oxygen    . predniSONE (DELTASONE) 10 MG tablet Take 2 tablets (20 mg total) by mouth at bedtime.    . triamcinolone acetonide (KENALOG) 40 MG/ML injection Inject 40 mg into the muscle every 3 (three) months. For knee pain    . triamcinolone ointment (KENALOG) 0.1 % Apply 1 application topically 2 (two) times daily. Once or twice weekly as needed for break out of Sarcoidosis     No current facility-administered medications for this visit.    Allergies:   Review of patient's allergies indicates no known allergies.    Social History:  The patient  reports that she has never smoked. She has never used smokeless tobacco. She reports that she drinks alcohol. She reports that she does not use illicit drugs.   Family History:  The patient's family history  includes Aneurysm in her sister; Deep vein thrombosis in her mother; Diabetes in her brother and father.    ROS:  Please see the history of present illness.   Otherwise, review of systems are positive for recent chills, visual disturbance, cough, shortness of breath, wheezing, joint swelling, headaches.   All other systems are reviewed and negative.    PHYSICAL EXAM: VS:  BP 145/72 mmHg  Pulse 95  Ht 5\' 6"  (1.676 m)  Wt 481 lb (218.18 kg)  BMI 77.67 kg/m2  LMP 06/24/2015 , BMI Body mass index is 77.67 kg/(m^2). GEN: Well nourished, well developed, in no acute distress HEENT: normal Neck: no JVD, carotid bruits, or masses Cardiac: RRR; no murmurs, rubs, or gallops,no edema, distant heart sounds Respiratory:  clear to auscultation bilaterally, normal work of breathing GI: soft, nontender, nondistended, + BS obese MS: no deformity or atrophy Skin: warm and dry, no rash, chronic edema changes Neuro:  Strength and sensation are intact Psych: euthymic mood, full affect   EKG:  EKG is not ordered today. 07/08/15-sinus rhythm, no other abnormalities, personally viewed Troponin normal.   Recent Labs: 01/15/2015: B Natriuretic Peptide 33.1 01/17/2015: Magnesium 1.7 06/12/2015: TSH 2.90 07/08/2015: ALT 12* 07/09/2015: Hemoglobin 8.3*; Platelets 290 07/11/2015: BUN 24*; Creatinine, Ser 0.70; Potassium 4.1; Sodium 139    Lipid Panel    Component Value Date/Time   CHOL 204* 06/12/2015 0956   TRIG 93.0 06/12/2015 0956   HDL 50.60 06/12/2015 0956   CHOLHDL 4 06/12/2015 0956   VLDL 18.6 06/12/2015 0956   LDLCALC 134* 06/12/2015 0956      Wt Readings from Last 3 Encounters:  07/15/15 481 lb (218.18 kg)  07/11/15 488 lb 3.2 oz (221.446 kg)  06/12/15 488 lb (221.355 kg)      Other studies Reviewed: Additional studies/ records that were reviewed today include: Labs, hospital records, EKG, x-rays. Review of the above records demonstrates: As above   ASSESSMENT AND PLAN:  1.   Dyspnea-secondary to morbid obesity, obesity hypoventilation syndrome, on chronic oxygen therapy. Continue to dictate weight loss. Thankfully, cardiac structures are normal. Normal ejection fraction. Normal BNP. Continue with current maintenance medication for treatment of blood pressure. Lasix. No further cardiac workup necessary. We discussed the possibility of invasive testing such as right heart cath however I'm sure that her pulmonary pressures are elevated secondary to underlying body habitus. This will not likely change management. Previous troponin normal.  2. Morbid obesity-as above, advocate weight loss.    Current medicines are reviewed at length with the patient today.  The patient does not have concerns  regarding medicines. We have placed in snapshot of peptic request for a chair without arms or perhaps a one armed chair for further office visits.  The following changes have been made:  no change  Labs/ tests ordered today include: none  No orders of the defined types were placed in this encounter.     Disposition:  As needed follow up.  Bobby Rumpf, MD  07/15/2015 2:58 PM    [facility] [place], [place], [place]  [address] Phone: [phone]; Fax: [phone]

## 2015-07-16 DIAGNOSIS — R0602 Shortness of breath: Secondary | ICD-10-CM | POA: Diagnosis not present

## 2015-07-16 DIAGNOSIS — M159 Polyosteoarthritis, unspecified: Secondary | ICD-10-CM | POA: Diagnosis not present

## 2015-07-16 DIAGNOSIS — E662 Morbid (severe) obesity with alveolar hypoventilation: Secondary | ICD-10-CM | POA: Diagnosis not present

## 2015-07-16 DIAGNOSIS — D500 Iron deficiency anemia secondary to blood loss (chronic): Secondary | ICD-10-CM | POA: Diagnosis not present

## 2015-07-16 DIAGNOSIS — D860 Sarcoidosis of lung: Secondary | ICD-10-CM | POA: Diagnosis not present

## 2015-07-16 DIAGNOSIS — M25512 Pain in left shoulder: Secondary | ICD-10-CM | POA: Diagnosis not present

## 2015-07-16 DIAGNOSIS — R768 Other specified abnormal immunological findings in serum: Secondary | ICD-10-CM | POA: Diagnosis not present

## 2015-07-16 DIAGNOSIS — M79641 Pain in right hand: Secondary | ICD-10-CM | POA: Diagnosis not present

## 2015-07-16 DIAGNOSIS — R5383 Other fatigue: Secondary | ICD-10-CM | POA: Diagnosis not present

## 2015-07-16 DIAGNOSIS — M79642 Pain in left hand: Secondary | ICD-10-CM | POA: Diagnosis not present

## 2015-07-17 ENCOUNTER — Ambulatory Visit (INDEPENDENT_AMBULATORY_CARE_PROVIDER_SITE_OTHER): Payer: Medicare Other | Admitting: Adult Health

## 2015-07-17 ENCOUNTER — Encounter: Payer: Self-pay | Admitting: Adult Health

## 2015-07-17 VITALS — BP 120/84 | HR 92 | Temp 98.5°F | Wt >= 6400 oz

## 2015-07-17 DIAGNOSIS — R0600 Dyspnea, unspecified: Secondary | ICD-10-CM | POA: Diagnosis not present

## 2015-07-17 DIAGNOSIS — Z09 Encounter for follow-up examination after completed treatment for conditions other than malignant neoplasm: Secondary | ICD-10-CM | POA: Diagnosis not present

## 2015-07-17 MED ORDER — FERROUS SULFATE 325 (65 FE) MG PO TABS: 325.0000 mg | ORAL_TABLET | Freq: Three times a day (TID) | ORAL | Status: DC

## 2015-07-17 NOTE — Progress Notes (Signed)
Subjective:    Patient ID: [name], female    DOB: 07/14/1966, 49 y.o.   MRN: [id number]  HPI  49 year old female who  has a past medical history of Hypertension; Hypothyroidism; Anemia; Morbid obesity; Gallstones; Seasonal allergies; Chronic hyperventilation syndrome; Shortness of breath; Sleep apnea; GERD (gastroesophageal reflux disease); H/O hiatal hernia; Arthritis; Pneumonia; COPD (chronic obstructive pulmonary disease); Sarcoidosis; and Kidney stones. Presents to the office today for hospital discharge follow up. She was seen in the ER on 07/08/2015, admitted and then discharged on 07/11/2015.   Per Hospital note:  presented to the [facility] ED on 07/08/15 with complaints of worsening dyspnea. Admitted for possible decompensated CHF Mx.  Hospital course was   Diastolic CHF, acute on chronic - Patient was diuresed with IV Lasix. She has lost 11 pounds weight since admission. Indicates that her breathing is back to baseline. As per report, was taking oral Lasix at home only as needed. She is advised to take Lasix 40 mg daily and may use additional 40 mg as needed for worsening leg edema, dyspnea or weight gain and she verbalizes understanding.   Acute on chronic hypoxic respiratory failure - Secondary to decompensated CHF complicating underlying OSA/OHS - Improved. Patient states that even at home, with activity on her home O2 2 L/m, she sometimes desaturates up to 85%. She has been advised to titrate up her oxygen with activity to maintain sats >88%. She verbalizes understanding. She has a pulse oximetry at home.  She endorses feeling much better since leaving the hospital. Denies any increased SOB, fevers, or feeling ill.   She had her labs drawn at Rheumatology yesterday and will send report.   She would like prescription for  1) Motorized scooter 2) Wrist Splint 3) Arts development officer.     Review of Systems  Constitutional: Negative.   HENT: Negative.   Eyes:  Negative.   Respiratory: Negative.   Cardiovascular: Negative.   Gastrointestinal: Negative.   Musculoskeletal: Negative.   Skin: Negative.   Neurological: Negative.   Hematological: Negative.   Psychiatric/Behavioral: Negative.   All other systems reviewed and are negative.  Past Medical History  Diagnosis Date  . Hypertension   . Hypothyroidism   . Anemia   . Morbid obesity   . Gallstones   . Seasonal allergies   . Chronic hyperventilation syndrome     w/ obesity tx with albuterol inhaler and oxygen 2L  . Shortness of breath   . Sleep apnea     uses CPAP machine   . GERD (gastroesophageal reflux disease)     diet controlled - no meds  . H/O hiatal hernia   . Arthritis     hands, shoulders, no meds  . Pneumonia     hoispitalized in 08/2011  . COPD (chronic obstructive pulmonary disease)     uses oxygen 2 L  . Sarcoidosis   . Kidney stones     Social History   Social History  . Marital Status: Single    Spouse Name: N/A  . Number of Children: N/A  . Years of Education: N/A   Occupational History  . unemployeed    Social History Main Topics  . Smoking status: Never Smoker   . Smokeless tobacco: Never Used  . Alcohol Use: Yes     Comment: occasionally  . Drug Use: No  . Sexual Activity: No   Other Topics Concern  . Not on file   Social History Narrative  Is not currently working.    Divorced for eight or nine years   Has one son who lives around here.        Past Surgical History  Procedure Laterality Date  . Cholecystectomy  2000  . Cesarean section  1994    x 1  . I and d of abcess  05/2011  . Hysteroscopy w/d&c  12/27/2011    Procedure: DILATATION AND CURETTAGE /HYSTEROSCOPY;  Surgeon: Maeola Sarah. Landry Mellow, MD;  Location: [facility];  Service: Gynecology;;  . Lung biopsy    . Uterine abletion      Family History  Problem Relation Age of Onset  . Diabetes Father   . Diabetes Brother   . Deep vein thrombosis Mother   . Aneurysm Sister     d/o  brain aneurysm    No Known Allergies  Current Outpatient Prescriptions on File Prior to Visit  Medication Sig Dispense Refill  . albuterol (PROVENTIL HFA;VENTOLIN HFA) 108 (90 BASE) MCG/ACT inhaler Inhale 1-2 puffs into the lungs every 6 (six) hours as needed for wheezing. 1 Inhaler 0  . cetirizine (ZYRTEC) 10 MG tablet Take 10 mg by mouth at bedtime as needed for allergies.    . cholecalciferol (VITAMIN D) 400 UNITS TABS tablet Take 400 Units by mouth daily.    . Coral Calcium 1000 (390 CA) MG TABS Take 1 tablet by mouth daily.    . furosemide (LASIX) 40 MG tablet Take 1 tablet (40 mg total) by mouth daily. 30 tablet 1  . HYDROcodone-acetaminophen (NORCO/VICODIN) 5-325 MG per tablet Take 1 tablet by mouth every 6 (six) hours as needed for moderate pain.    Marland Kitchen levothyroxine (SYNTHROID, LEVOTHROID) 100 MCG tablet Take 1 tablet (100 mcg total) by mouth daily at 6 (six) AM. 30 tablet 4  . losartan (COZAAR) 50 MG tablet TAKE 1 TABLET (50 MG TOTAL) BY MOUTH DAILY. 30 tablet 1  . methocarbamol (ROBAXIN) 500 MG tablet Take 500 mg by mouth every 6 (six) hours as needed for muscle spasms.    . metoprolol succinate (TOPROL-XL) 25 MG 24 hr tablet TAKE 1 TABLET (25 MG TOTAL) BY MOUTH DAILY. 30 tablet 1  . NON FORMULARY 2 liter of oxygen    . predniSONE (DELTASONE) 10 MG tablet Take 2 tablets (20 mg total) by mouth at bedtime.    . triamcinolone acetonide (KENALOG) 40 MG/ML injection Inject 40 mg into the muscle every 3 (three) months. For knee pain    . triamcinolone ointment (KENALOG) 0.1 % Apply 1 application topically 2 (two) times daily. Once or twice weekly as needed for break out of Sarcoidosis     No current facility-administered medications on file prior to visit.    BP 120/84 mmHg  Pulse 92  Temp(Src) 98.5 F (36.9 C) (Oral)  Wt 483 lb (219.087 kg)  SpO2 97%  LMP 06/24/2015       Objective:   Physical Exam   Constitutional: She is oriented to person, place, and time. She appears  well-developed and well-nourished. No distress.  Morbidly obese  Eyes: Conjunctivae and EOM are normal. Pupils are equal, round, and reactive to light. Right eye exhibits no discharge. Left eye exhibits no discharge. No scleral icterus.  Neck: Normal range of motion. Neck supple. No thyromegaly present.  Cardiovascular: Normal rate, regular rhythm, normal heart sounds and intact distal pulses. Exam reveals no gallop and no friction rub.  No murmur heard. Pulmonary/Chest: Effort normal and breath sounds normal. No respiratory distress. She  has no wheezes. She has no rales. She exhibits no tenderness.  On 2L via [place] becomes SOB with mild exertion  Musculoskeletal: She exhibits edema (bilateral lower extremities). She exhibits no tenderness.  Lymphadenopathy:   She has no cervical adenopathy.  Neurological: She is alert and oriented to person, place, and time. She has normal reflexes. No cranial nerve deficit. Coordination normal.  Skin: Skin is warm and dry. No rash noted. She is not diaphoretic. No erythema. No pallor.  Psychiatric: She has a normal mood and affect. Her behavior is normal. Judgment and thought content normal.  Nursing note and vitals reviewed.     Assessment & Plan:  1. Hospital discharge follow-up - Follow up as needed - Continue with current medication therapy - Paper prescription given for wrist splints and bath bench.   2. Morbid obesity - Continue to encourage weight loss.  3. Dyspnea - Likely due to morbid obesity. She is on chronic oxygen therapy.   Continue with current medications. She was seen by Cardiology this week and had exams done in the hospital, cardiac structures normal.

## 2015-07-17 NOTE — Progress Notes (Signed)
Pre visit review using our clinic review tool, if applicable. No additional management support is needed unless otherwise documented below in the visit note. 

## 2015-07-17 NOTE — Patient Instructions (Signed)
It was great seeing you today and I am happy you are feeling better since being in the hospital.   I will follow up with you when I get your lab reports.   Please let me know if you need anything

## 2015-07-17 NOTE — Addendum Note (Signed)
Addended by: Westley Hummer B on: 07/17/2015 03:40 PM   Modules accepted: Orders

## 2015-07-20 ENCOUNTER — Ambulatory Visit (INDEPENDENT_AMBULATORY_CARE_PROVIDER_SITE_OTHER): Payer: Medicare Other | Admitting: Adult Health

## 2015-07-20 ENCOUNTER — Encounter: Payer: Self-pay | Admitting: Adult Health

## 2015-07-20 VITALS — BP 128/80 | HR 101 | Temp 98.2°F | Ht 66.0 in | Wt >= 6400 oz

## 2015-07-20 DIAGNOSIS — G4733 Obstructive sleep apnea (adult) (pediatric): Secondary | ICD-10-CM | POA: Diagnosis not present

## 2015-07-20 DIAGNOSIS — I5032 Chronic diastolic (congestive) heart failure: Secondary | ICD-10-CM

## 2015-07-20 DIAGNOSIS — D869 Sarcoidosis, unspecified: Secondary | ICD-10-CM

## 2015-07-20 NOTE — Patient Instructions (Addendum)
Continue to decrease Prednisone as directed by Dr. Lenna Gilford.  Follow up with Rheumatology next week as planned  Continue on CPAP At bedtime   Do not drive if sleepy.  Continue on Oxygen 2l/m  Follow up with Dr. Elsworth Soho  In 2 months and As needed   Please contact office for sooner follow up if symptoms do not improve or worsen or seek emergency care

## 2015-07-21 NOTE — Progress Notes (Signed)
Subjective:    Patient ID: [name], female    DOB: 23-Feb-1966, 49 y.o.   MRN: [id number]  HPI  [name]   49 yo morbidly obese woman with OSA & BL infiltrates & hilar and mediastinal lymphadenopathy , presumed sarcoid (although TBBX neg ) with skin & joint involvement  Significant tests/ events:  First seen in hospital 01/26/11  for acute dyspnea and hypoxia found to have OHS and OSA with recs for continuous O2 ( 2 L/m at rest & 4L/m on walking)and Nocturnal CPAP  Echo- mild LVH , EF 55-60% . VQ scan intermed prob , doppler neg. CT chest  neg for PE.  CPAP titration 03/2011 (wt 479 lbs) -CPAP 7 cm to stop snoring, No desaturation on O2 2 L/min   09/2011 - Hosp adm for hemoptysis & BL infiltrates on CT,p-anca pos 1.80, gbm neg, ANA neg, ESR 42  Labs c/w fe def anemia, UA pos blood (was having periods) >> rpt UA no RBCs  Underwent endometrial ablation for menorrhagia -unsuccessful  08/2012 admitted for atypical chest pain, and shortness of breath.  CT chest was negative for PE, notable, hilar and mediastinal lymphadenopathy and cardiomegaly  C-ANCA 1:40, ANA 1:320 , homogenous  ACE level 21   10/02/2012 TBBX >> no granulomas, BAL neg afb, fungal  12/2012 -Was seen by Dr. [name] at [facility]. repeat labs>> neg pANCA and cANCA ,ESR 48 and neg ANA , low ACE level    CT chest 12/2013 >decreased hilar /mediastinal adenopathy-(done 1 week after steroids started ) >>>Pred decreased to 94m daily   CT chest 04/24/14 - Scattered pulmonary opacities with ground-glass densities, clustered reticulonodular densities and interstitial thickening  06/12/14  Patient reports previous biopsy of, arm, rash, by dermatology was consistent with sun exposure dermatitis.   10/2014 PFTs-FVC 55%, DLCO 44%    03/09/15 increased joint pain and chest tightness - on prednisone 146mevery other day - Increased to 1026maily , today she took 20 mg because she was having  severe joint pains in the morning  Adm 12/2014 for acute resp failure secondary to CHF and PNA superimposed upon underlying OSA/OHS, sarcoidosis, and anemia -required 2 units PRBC for hemoglobin 6.7 CT angiogram chest negative for pulmonary embolus-Revealed scattered  opacities bilateral  She has been on and off prednisone since 12/2013-started for skin rash and joint pains  -more compliant with O2  >steroid burst and rhuematology referral .    04/21/15  Follow up :  BL infiltrates & hilar and mediastinal lymphadenopathy , presumed sarcoid (although TBBX neg ) with skin & joint involvement Pt returns for 4 week follow up Sarcoid and OSA.  Reports breathing is back to baseline Seen last ov with flare of severe joint pain, rash and increased DOE. She was started on steroid burst with taper to  96m38m. She is  Feeling so much better.  Labs showed ANA remains positive with titer 1:1280.  Has seen Rheumatology in past (~2103) for possible Sarcoid/pulmonary infiltrates  and positive ANA /c-ANCA  She was referred back to rheumatology and has office visit in July.  JOint pain is improved and Rash is gone  Currently on prednisone 96mg30mly  No fever, chest pain, orthopnea, increased edema, orthopnea, discolored mucus.  Doing well on CPAP , no new issues .  >>decrease pred 20 and 10 alternating  [facility] Pt presents for a post hospital follow up  Admitted 9/7-9/10  for acute on chronic diastolic CHF .  She was aggressively diuresed with IV lasix with 11 lbs wt loss during admit.  Says her breathing is doing better since discharge.  Gets winded with activity easily .  Has seen Rheumatology , Dr. Lenna Gilford. She is tapering her prednisone slowly down to 62m .  Wants to repeat some of her labs on lower dose steroids.  Continues on CPAP At bedtime  . Says doing well with mask. Feels rested.      Review of Systems neg for any significant sore throat,  dysphagia, itching, sneezing, nasal congestion or excess/ purulent secretions, fever, chills, sweats, unintended wt loss, pleuritic or exertional cp, hempoptysis, orthopnea pnd or change in chronic leg swelling. Also denies presyncope, palpitations, heartburn, abdominal pain, nausea, vomiting, diarrhea or change in bowel or urinary habits, dysuria,hematuria, rash, arthralgias, visual complaints, headache, numbness weakness or ataxia.     Objective:   Physical Exam  Gen. Pleasant, morbidly obese, in no distress, normal affect ENT - no lesions, no post nasal drip, class 2-3 airway Neck: No JVD, no thyromegaly, no carotid bruits Lungs: no use of accessory muscles, no dullness to percussion, decreased without rales or rhonchi  Cardiovascular: Rhythm regular, heart sounds  normal, no murmurs or gallops, chronic 1-2 + peripheral edema (better than usual), VI changes  Abdomen: soft and non-tender, no hepatosplenomegaly, BS normal. Musculoskeletal: No deformities, no cyanosis or clubbing Neuro:  alert, non focal, no tremors       Assessment & Plan:

## 2015-07-21 NOTE — Assessment & Plan Note (Signed)
Recent diastolic CHF flare now improved with diuresis   Plan  Continue on Oxygen 2l/m  Follow up with Dr. Elsworth Soho  In 2 months and As needed   Please contact office for sooner follow up if symptoms do not improve or worsen or seek emergency care

## 2015-07-21 NOTE — Assessment & Plan Note (Signed)
Presumed Sarcoid , without flare  Cont to taper steroids per rheumatology as directed  Plan  Continue to decrease Prednisone as directed by Dr. Lenna Gilford.  Follow up with Rheumatology next week as planned  Follow up with Dr. Elsworth Soho  In 2 months and As needed   Please contact office for sooner follow up if symptoms do not improve or worsen or seek emergency care

## 2015-07-21 NOTE — Progress Notes (Signed)
Reviewed & agree with plan  

## 2015-07-21 NOTE — Assessment & Plan Note (Signed)
Continue on CPAP At bedtime   Do not drive if sleepy.    Follow up with Dr. Elsworth Soho  In 2 months and As needed   Please contact office for sooner follow up if symptoms do not improve or worsen or seek emergency care

## 2015-08-05 ENCOUNTER — Ambulatory Visit: Payer: Medicare Other | Admitting: Adult Health

## 2015-08-06 DIAGNOSIS — M159 Polyosteoarthritis, unspecified: Secondary | ICD-10-CM | POA: Diagnosis not present

## 2015-08-06 DIAGNOSIS — D860 Sarcoidosis of lung: Secondary | ICD-10-CM | POA: Diagnosis not present

## 2015-08-06 DIAGNOSIS — D500 Iron deficiency anemia secondary to blood loss (chronic): Secondary | ICD-10-CM | POA: Diagnosis not present

## 2015-08-06 DIAGNOSIS — M064 Inflammatory polyarthropathy: Secondary | ICD-10-CM | POA: Diagnosis not present

## 2015-08-06 DIAGNOSIS — E662 Morbid (severe) obesity with alveolar hypoventilation: Secondary | ICD-10-CM | POA: Diagnosis not present

## 2015-08-06 DIAGNOSIS — R768 Other specified abnormal immunological findings in serum: Secondary | ICD-10-CM | POA: Diagnosis not present

## 2015-08-20 ENCOUNTER — Other Ambulatory Visit: Payer: Self-pay | Admitting: Adult Health

## 2015-08-31 ENCOUNTER — Other Ambulatory Visit: Payer: Self-pay | Admitting: Adult Health

## 2015-08-31 MED ORDER — FERROUS SULFATE 325 (65 FE) MG PO TABS: 325.0000 mg | ORAL_TABLET | Freq: Three times a day (TID) | ORAL | Status: DC

## 2015-09-01 ENCOUNTER — Other Ambulatory Visit: Payer: Self-pay | Admitting: Pulmonary Disease

## 2015-09-02 ENCOUNTER — Other Ambulatory Visit: Payer: Self-pay | Admitting: Pulmonary Disease

## 2015-09-03 DIAGNOSIS — M79641 Pain in right hand: Secondary | ICD-10-CM | POA: Diagnosis not present

## 2015-09-03 DIAGNOSIS — M79642 Pain in left hand: Secondary | ICD-10-CM | POA: Diagnosis not present

## 2015-09-03 DIAGNOSIS — D500 Iron deficiency anemia secondary to blood loss (chronic): Secondary | ICD-10-CM | POA: Diagnosis not present

## 2015-09-03 DIAGNOSIS — D860 Sarcoidosis of lung: Secondary | ICD-10-CM | POA: Diagnosis not present

## 2015-09-03 DIAGNOSIS — R0602 Shortness of breath: Secondary | ICD-10-CM | POA: Diagnosis not present

## 2015-09-03 DIAGNOSIS — M064 Inflammatory polyarthropathy: Secondary | ICD-10-CM | POA: Diagnosis not present

## 2015-09-07 ENCOUNTER — Telehealth: Payer: Self-pay | Admitting: Adult Health

## 2015-09-07 NOTE — Telephone Encounter (Signed)
lmtcb x1 for pt. 

## 2015-09-08 NOTE — Telephone Encounter (Signed)
  lmtcb for pt.    Patient Instructions     Continue to decrease Prednisone as directed by Dr. Lenna Gilford.  Follow up with Rheumatology next week as planned  Continue on CPAP At bedtime  Do not drive if sleepy.  Continue on Oxygen 2l/m  Follow up with Dr. Elsworth Soho In 2 months and As needed  Please contact office for sooner follow up if symptoms do not improve or worsen or seek emergency care

## 2015-09-08 NOTE — Telephone Encounter (Signed)
Patient Returned call [phone]

## 2015-09-08 NOTE — Telephone Encounter (Signed)
lmtcb for pt.  

## 2015-09-09 NOTE — Telephone Encounter (Signed)
Pt had misunderstood course of treatment, was not aware that our office was deferring her Prednisone refills to Dr Ileana Ladd.  Pt was very understanding when I explained this to her. Pt to contact our office if there is any issue with Dr [name] prescribing Prednisone and following this medication in the future. Pt has appt with Rheum 09/10/15. Nothing further needed.

## 2015-09-09 NOTE — Telephone Encounter (Signed)
[phone], pt cb

## 2015-09-21 ENCOUNTER — Ambulatory Visit (INDEPENDENT_AMBULATORY_CARE_PROVIDER_SITE_OTHER): Payer: Medicare Other | Admitting: Adult Health

## 2015-09-21 ENCOUNTER — Encounter: Payer: Self-pay | Admitting: Adult Health

## 2015-09-21 VITALS — BP 130/80 | HR 92 | Temp 98.5°F | Ht 66.0 in | Wt >= 6400 oz

## 2015-09-21 DIAGNOSIS — J9610 Chronic respiratory failure, unspecified whether with hypoxia or hypercapnia: Secondary | ICD-10-CM

## 2015-09-21 DIAGNOSIS — J9611 Chronic respiratory failure with hypoxia: Secondary | ICD-10-CM | POA: Diagnosis not present

## 2015-09-21 DIAGNOSIS — G4733 Obstructive sleep apnea (adult) (pediatric): Secondary | ICD-10-CM

## 2015-09-21 DIAGNOSIS — D869 Sarcoidosis, unspecified: Secondary | ICD-10-CM | POA: Diagnosis not present

## 2015-09-21 NOTE — Addendum Note (Signed)
Addended by: Osa Craver on: 09/21/2015 04:45 PM   Modules accepted: Orders

## 2015-09-21 NOTE — Progress Notes (Signed)
Subjective:    Patient ID: [name], female    DOB: 23-Feb-1966, 49 y.o.   MRN: [id number]  HPI  [name]   49 yo morbidly obese woman with OSA & BL infiltrates & hilar and mediastinal lymphadenopathy , presumed sarcoid (although TBBX neg ) with skin & joint involvement  Significant tests/ events:  First seen in hospital 01/26/11  for acute dyspnea and hypoxia found to have OHS and OSA with recs for continuous O2 ( 2 L/m at rest & 4L/m on walking)and Nocturnal CPAP  Echo- mild LVH , EF 55-60% . VQ scan intermed prob , doppler neg. CT chest  neg for PE.  CPAP titration 03/2011 (wt 479 lbs) -CPAP 7 cm to stop snoring, No desaturation on O2 2 L/min   09/2011 - Hosp adm for hemoptysis & BL infiltrates on CT,p-anca pos 1.80, gbm neg, ANA neg, ESR 42  Labs c/w fe def anemia, UA pos blood (was having periods) >> rpt UA no RBCs  Underwent endometrial ablation for menorrhagia -unsuccessful  08/2012 admitted for atypical chest pain, and shortness of breath.  CT chest was negative for PE, notable, hilar and mediastinal lymphadenopathy and cardiomegaly  C-ANCA 1:40, ANA 1:320 , homogenous  ACE level 21   10/02/2012 TBBX >> no granulomas, BAL neg afb, fungal  12/2012 -Was seen by Dr. [name] at [facility]. repeat labs>> neg pANCA and cANCA ,ESR 48 and neg ANA , low ACE level    CT chest 12/2013 >decreased hilar /mediastinal adenopathy-(done 1 week after steroids started ) >>>Pred decreased to 94m daily   CT chest 04/24/14 - Scattered pulmonary opacities with ground-glass densities, clustered reticulonodular densities and interstitial thickening  06/12/14  Patient reports previous biopsy of, arm, rash, by dermatology was consistent with sun exposure dermatitis.   10/2014 PFTs-FVC 55%, DLCO 44%    03/09/15 increased joint pain and chest tightness - on prednisone 146mevery other day - Increased to 1026maily , today she took 20 mg because she was having  severe joint pains in the morning  Adm 12/2014 for acute resp failure secondary to CHF and PNA superimposed upon underlying OSA/OHS, sarcoidosis, and anemia -required 2 units PRBC for hemoglobin 6.7 CT angiogram chest negative for pulmonary embolus-Revealed scattered  opacities bilateral  She has been on and off prednisone since 12/2013-started for skin rash and joint pains  -more compliant with O2  >steroid burst and rhuematology referral .    04/21/15  Follow up :  BL infiltrates & hilar and mediastinal lymphadenopathy , presumed sarcoid (although TBBX neg ) with skin & joint involvement Pt returns for 4 week follow up Sarcoid and OSA.  Reports breathing is back to baseline Seen last ov with flare of severe joint pain, rash and increased DOE. She was started on steroid burst with taper to  96m38m. She is  Feeling so much better.  Labs showed ANA remains positive with titer 1:1280.  Has seen Rheumatology in past (~2103) for possible Sarcoid/pulmonary infiltrates  and positive ANA /c-ANCA  She was referred back to rheumatology and has office visit in July.  JOint pain is improved and Rash is gone  Currently on prednisone 96mg30mly  No fever, chest pain, orthopnea, increased edema, orthopnea, discolored mucus.  Doing well on CPAP , no new issues .  >>decrease pred 20 and 10 alternating  [facility]/OHS Pt presents for a post hospital follow up  Admitted 9/7-9/10  for acute on chronic diastolic CHF .  She was aggressively diuresed with IV lasix with 11 lbs wt loss during admit.  Says her breathing is doing better since discharge.  Gets winded with activity easily .  Has seen Rheumatology , Dr. Lenna Gilford. She is tapering her prednisone slowly down to 47m .  Wants to repeat some of her labs on lower dose steroids.  Continues on CPAP At bedtime  . Says doing well with mask. Feels rested.  >>no changes    09/21/2015 Follow up : Presumed Sarcoid /OSA  /OHS  Returns for 3 month  follow up .  Pt has sarcoid and polyarthriits (?seronegative RA vs Sarcoid joint involvement .  Rheumatology notes reviewed , started on Methotrexate  Just started, s/p 2 injections. No sign change in joint pain.  Has decreased prednisone 170mdaily . No flare on lower dose.  Continues to have dry cough and intermittent congestion..  Gets winded with activity -chronic  Remains on CPAP At bedtime  . Wears out every night . Feels rested.  Remains on O2 2l/m and At bedtime  .  Patient denies any hemoptysis, chest pain, orthopnea, PND,.     Review of Systems neg for any significant sore throat, dysphagia, itching, sneezing, nasal congestion or excess/ purulent secretions, fever, chills, sweats, unintended wt loss, pleuritic or exertional cp, hempoptysis, orthopnea pnd or change in chronic leg swelling. Also denies presyncope, palpitations, heartburn, abdominal pain, nausea, vomiting, diarrhea or change in bowel or urinary habits, dysuria,hematuria, rash, arthralgias, visual complaints, headache, numbness weakness or ataxia.     Objective:   Physical Exam  Gen. Pleasant, morbidly obese, in no distress, normal affect ENT - no lesions, no post nasal drip, class 2-3 airway Neck: No JVD, no thyromegaly, no carotid bruits Lungs: no use of accessory muscles, no dullness to percussion, decreased without rales or rhonchi  Cardiovascular: Rhythm regular, heart sounds  normal, no murmurs or gallops, chronic 1-2 + peripheral edema VI changes  Abdomen: soft and non-tender, no hepatosplenomegaly, BS normal. Morbidly obese Musculoskeletal: No deformities, no cyanosis or clubbing Neuro:  alert, non focal, no tremors       Assessment & Plan:

## 2015-09-21 NOTE — Assessment & Plan Note (Signed)
Stable without flare  On MTX per rheumatology , hopefully can get steroids tapered down  Plan  Continue Prednisone as directed by Dr. Lenna Gilford.  Follow up with Rheumatology as planned  Continue on Oxygen 2l/m  Follow up with Dr. Elsworth Soho  In 3 months and As needed   Please contact office for sooner follow up if symptoms do not improve or worsen or seek emergency care

## 2015-09-21 NOTE — Patient Instructions (Signed)
Continue Prednisone as directed by Dr. Lenna Gilford.  Follow up with Rheumatology as planned  Continue on CPAP At bedtime   Do not drive if sleepy.  Continue on Oxygen 2l/m  Follow up with Dr. Elsworth Soho  In 3 months and As needed   Please contact office for sooner follow up if symptoms do not improve or worsen or seek emergency care

## 2015-09-21 NOTE — Assessment & Plan Note (Signed)
Cont on o2 .  

## 2015-09-21 NOTE — Assessment & Plan Note (Signed)
Continue on CPAP At bedtime   Do not drive if sleepy.  Continue on Oxygen 2l/m  Follow up with Dr. Elsworth Soho  In 3 months and As needed   Please contact office for sooner follow up if symptoms do not improve or worsen or seek emergency care

## 2015-09-23 NOTE — Progress Notes (Signed)
Reviewed & agree with plan  

## 2015-10-14 DIAGNOSIS — M1712 Unilateral primary osteoarthritis, left knee: Secondary | ICD-10-CM | POA: Diagnosis not present

## 2015-10-14 DIAGNOSIS — M1711 Unilateral primary osteoarthritis, right knee: Secondary | ICD-10-CM | POA: Diagnosis not present

## 2015-11-03 DIAGNOSIS — Z79899 Other long term (current) drug therapy: Secondary | ICD-10-CM | POA: Diagnosis not present

## 2015-11-03 DIAGNOSIS — R0602 Shortness of breath: Secondary | ICD-10-CM | POA: Diagnosis not present

## 2015-11-03 DIAGNOSIS — E662 Morbid (severe) obesity with alveolar hypoventilation: Secondary | ICD-10-CM | POA: Diagnosis not present

## 2015-11-03 DIAGNOSIS — M064 Inflammatory polyarthropathy: Secondary | ICD-10-CM | POA: Diagnosis not present

## 2015-11-03 DIAGNOSIS — M79641 Pain in right hand: Secondary | ICD-10-CM | POA: Diagnosis not present

## 2015-11-03 DIAGNOSIS — R768 Other specified abnormal immunological findings in serum: Secondary | ICD-10-CM | POA: Diagnosis not present

## 2015-11-03 DIAGNOSIS — D860 Sarcoidosis of lung: Secondary | ICD-10-CM | POA: Diagnosis not present

## 2015-11-03 DIAGNOSIS — M159 Polyosteoarthritis, unspecified: Secondary | ICD-10-CM | POA: Diagnosis not present

## 2015-11-03 DIAGNOSIS — M79642 Pain in left hand: Secondary | ICD-10-CM | POA: Diagnosis not present

## 2015-11-23 ENCOUNTER — Telehealth: Payer: Self-pay | Admitting: *Deleted

## 2015-11-23 NOTE — Telephone Encounter (Signed)
We need to see her for possible UTI or she can go to urgent care

## 2015-11-23 NOTE — Telephone Encounter (Signed)
PLEASE NOTE: All timestamps contained within this report are represented as [place] Standard Time. CONFIDENTIALTY NOTICE: This fax transmission is intended only for the addressee. It contains information that is legally privileged, confidential or otherwise protected from use or disclosure. If you are not the intended recipient, you are strictly prohibited from reviewing, disclosing, copying using or disseminating any of this information or taking any action in reliance on or regarding this information. If you have received this fax in error, please notify us immediately by telephone so that we can arrange for its return to [place]. Phone: [phone], Toll-Free: [phone], Fax: [phone] Page: 1 of 2 Call Id: [id number] [facility] Night - Client [place] Patient Name: [name] ON Gender: Female DOB: 1966/08/25 Age: 50 Y 1 M 18 D Return Phone Number: [phone] (Primary), [phone] (Secondary) Address: [place]/State/Zip: [place] [address] Client [facility] Night - Client Client Site [facility] - Night Contact Type Call Call Type Triage / Clinical Relationship To Patient Self Return Phone Number [phone] (Primary) Chief Complaint Flank Pain Initial Comment Caller states has dull pain in RT side; frequent urination; urine is cloudy;sees Cory L. Nafziger, not listed, but is on Website. PreDisposition Go to ED Nurse Assessment Nurse: Mills-Hernandez, RN, Izora Gala Date/Time (Eastern Time): 11/21/2015 11:06:15 AM Confirm and document reason for call. If symptomatic, describe symptoms. You must click the next button to save text entered. ---Caller states that she is having flank pain, frequent urination and cloudy urine that started in the middle of the night. Has the patient traveled out of the country within the last 30 days? ---No Does the patient  have any new or worsening symptoms? ---Yes Will a triage be completed? ---Yes Related visit to physician within the last 2 weeks? ---No Does the PT have any chronic conditions? (i.e. diabetes, asthma, etc.) ---No Did the patient indicate they were pregnant? ---No Is this a behavioral health or substance abuse call? ---No Guidelines Guideline Title Affirmed Question Affirmed Notes Nurse Date/Time Eilene Ghazi Time) Flank Pain History of kidney stones Mills-Hernandez, RN, Izora Gala 11/21/2015 11:08:16 AM Disp. Time Eilene Ghazi Time) Disposition Final User 11/21/2015 11:14:27 AM See PCP When Office is Open (within 3 days) Yes Mills-Hernandez, RN, Cindee Salt Understands: Yes Disagree/Comply: Comply PLEASE NOTE: All timestamps contained within this report are represented as [place] Standard Time. CONFIDENTIALTY NOTICE: This fax transmission is intended only for the addressee. It contains information that is legally privileged, confidential or otherwise protected from use or disclosure. If you are not the intended recipient, you are strictly prohibited from reviewing, disclosing, copying using or disseminating any of this information or taking any action in reliance on or regarding this information. If you have received this fax in error, please notify us immediately by telephone so that we can arrange for its return to [place]. Phone: [phone], Toll-Free: [phone], Fax: [phone] Page: 2 of 2 Call Id: [id number] Care Advice Given Per Guideline SEE PCP WITHIN 3 DAYS: * You need to be seen within 2 or 3 days. Call your doctor during regular office hours and make an appointment. An urgent care center is often the best source of care if your doctor's office is closed or you can't get an appointment. NOTE: If office will be open tomorrow, tell caller to call then, not in 3 days. PAIN MEDICINES: * For pain relief, take acetaminophen, ibuprofen, or naproxen. Caller does currently have kidney stones and  is under the care  of an urologist. They are just monitoring her kidney stones at this point. Caller will f/u with her urologist on Monday. * Use the lowest amount that makes your pain feel better. ACETAMINOPHEN (E.G., TYLENOL): * Take 650 mg (two 325 mg pills) by mouth every 4-6 hours as needed. Each Regular Strength Tylenol pill has 325 mg of acetaminophen. The most you should take each day is 3,250 mg (10 Regular Strength pills a day). CALL BACK IF: * Fever over 100.5 F (38.1 C) * Burning with urination or blood in urine * You become worse. CARE ADVICE given per Flank Pain (Adult) guideline. After Care Instructions Given Call Event Type User Date / Time Description Referrals REFERRED TO PCP OFFICE

## 2015-11-23 NOTE — Telephone Encounter (Signed)
Called and spoke with pt and pt is aware. Pt states the thing about it is the pain in her side has gone away. Pt is going to see urology because she has a history of kidney stones. Advised pt to continue to follow up with urology and call the office if further assistance is needed.

## 2016-01-06 ENCOUNTER — Ambulatory Visit: Payer: Medicare Other | Admitting: Pulmonary Disease

## 2016-01-12 ENCOUNTER — Telehealth: Payer: Self-pay | Admitting: Adult Health

## 2016-01-12 NOTE — Telephone Encounter (Signed)
That is fine 

## 2016-01-12 NOTE — Telephone Encounter (Signed)
Pt would like to know if you can send a RX to [facility] for a bath bench. Pt was given one in 2016, but never picked it up. They will not fill because it is over 40 days old. Pt needs new order. She would like to know if you can fax directly to [facility].   [facility] phone:  [phone]

## 2016-01-15 ENCOUNTER — Telehealth: Payer: Self-pay | Admitting: Adult Health

## 2016-01-15 NOTE — Telephone Encounter (Signed)
Order faxed to [facility].

## 2016-01-15 NOTE — Telephone Encounter (Signed)
Duplicate message. 

## 2016-01-15 NOTE — Telephone Encounter (Signed)
Pt call to follow up on bath bench. She said [facility] has not received the order

## 2016-01-15 NOTE — Telephone Encounter (Addendum)
[facility] needs order fax for bath tub seat not bench. Fax # [phone]

## 2016-01-18 ENCOUNTER — Other Ambulatory Visit: Payer: Self-pay | Admitting: Family

## 2016-01-18 NOTE — Telephone Encounter (Signed)
Pt last visit 07/17/15 Pt last TSH 06/12/15 Pt last Rx refill 09/16/13

## 2016-01-18 NOTE — Telephone Encounter (Signed)
Ok to give order? 

## 2016-01-18 NOTE — Telephone Encounter (Signed)
If the last time this medication was refilled was in November 2014, she is not taking it. She needs to come back in for testing.

## 2016-01-18 NOTE — Telephone Encounter (Signed)
Order faxed to [facility].

## 2016-01-19 ENCOUNTER — Other Ambulatory Visit: Payer: Self-pay

## 2016-01-19 DIAGNOSIS — Z1231 Encounter for screening mammogram for malignant neoplasm of breast: Secondary | ICD-10-CM

## 2016-01-19 NOTE — Telephone Encounter (Signed)
Order was fax to [phone]

## 2016-01-25 ENCOUNTER — Ambulatory Visit: Payer: Medicare Other

## 2016-02-08 ENCOUNTER — Ambulatory Visit (INDEPENDENT_AMBULATORY_CARE_PROVIDER_SITE_OTHER): Payer: Medicare Other | Admitting: Pulmonary Disease

## 2016-02-08 ENCOUNTER — Encounter: Payer: Self-pay | Admitting: Pulmonary Disease

## 2016-02-08 VITALS — BP 154/82 | HR 94 | Ht 66.0 in | Wt >= 6400 oz

## 2016-02-08 DIAGNOSIS — D869 Sarcoidosis, unspecified: Secondary | ICD-10-CM | POA: Diagnosis not present

## 2016-02-08 DIAGNOSIS — J9611 Chronic respiratory failure with hypoxia: Secondary | ICD-10-CM | POA: Diagnosis not present

## 2016-02-08 DIAGNOSIS — G4733 Obstructive sleep apnea (adult) (pediatric): Secondary | ICD-10-CM

## 2016-02-08 DIAGNOSIS — I5032 Chronic diastolic (congestive) heart failure: Secondary | ICD-10-CM | POA: Diagnosis not present

## 2016-02-08 MED ORDER — ALBUTEROL SULFATE HFA 108 (90 BASE) MCG/ACT IN AERS: 1.0000 | INHALATION_SPRAY | Freq: Four times a day (QID) | RESPIRATORY_TRACT | Status: DC | PRN

## 2016-02-08 MED ORDER — PREDNISONE 10 MG PO TABS: ORAL_TABLET | ORAL | Status: DC

## 2016-02-08 NOTE — Progress Notes (Signed)
Subjective:    Patient ID: [name], female    DOB: 31-Jan-1966, 50 y.o.   MRN: [id number]  HPI  [name]   50 yo morbidly obese woman with OSA & BL infiltrates & hilar and mediastinal lymphadenopathy , presumed sarcoid (although TBBX neg ) with skin & joint involvement She also has inflammatory arthritis with positive ANA and follows with rheumatology [name])  02/08/2016  Chief Complaint  Patient presents with  . Follow-up    Pt states that breathing is horrible - worsening DOE x 1 month. Pt c/o cough and wheezing. Denies chest tightness. Uses 2 Liters O2 (continuous) 24/7    She has been on and off prednisone since 12/2013-started for skin rash and joint pains  -more compliant with O2   Seen by rhuematology >> [place],  on weekly mtx injections & 10 mg pred now Had the 'flu' in March  Breathing has been worse since Self titrating lasix based on wt Spots coming up on legs & arms- respond to steroid cream  Wt has increased   Remains on CPAP At bedtime  . Wears out every night . Feels rested.  Remains on O2 2l/m and At bedtime  .    Significant tests/ events:  First seen in hospital 12/2010  for acute dyspnea and hypoxia found to have OHS and OSA with recs for continuous O2 ( 2 L/m at rest & 4L/m on walking)and Nocturnal CPAP  Echo- mild LVH , EF 55-60% . VQ scan intermed prob , doppler neg. CT chest  neg for PE.  CPAP titration 03/2011 (wt 479 lbs) -CPAP 7 cm to stop snoring, No desaturation on O2 2 L/min   09/2011 - Hosp adm for hemoptysis & BL infiltrates on CT,p-anca pos 1.80, gbm neg, ANA neg, ESR 42  Labs c/w fe def anemia, UA pos blood (was having periods) >> rpt UA no RBCs  Underwent endometrial ablation for menorrhagia -unsuccessful  08/2012 admitted for atypical chest pain, and shortness of breath.  CT chest was negative for PE, notable, hilar and mediastinal lymphadenopathy and cardiomegaly  C-ANCA 1:40, ANA 1:320 , homogenous   ACE level 21   10/02/2012 TBBX >> no granulomas, BAL neg afb, fungal  12/2012 -Was seen by Dr. [name] at [place]. repeat labs>> neg pANCA and cANCA ,ESR 48 and neg ANA , low ACE level    CT chest 12/2013 >decreased hilar /mediastinal adenopathy-(done 1 week after steroids started ) >>>Pred decreased to 90m daily   CT chest 04/24/14 - Scattered pulmonary opacities with ground-glass densities, clustered reticulonodular densities and interstitial thickening  06/12/14  Patient reports previous biopsy of, arm, rash, by dermatology was consistent with sun exposure dermatitis.   10/2014 PFTs-FVC 55%, DLCO 44%    03/09/15 increased joint pain and chest tightness - on prednisone 169mevery other day - Increased to 1023maily , today she took 20 mg because she was having severe joint pains in the morning  Adm 12/2014 for acute resp failure secondary to CHF and PNA superimposed upon underlying OSA/OHS, sarcoidosis, and anemia -required 2 units PRBC for hemoglobin 6.7 CT angiogram chest negative for pulmonary embolus-Revealed scattered  opacities bilateral   Review of Systems Patient denies any hemoptysis, chest pain, orthopnea, PND,.  Patient denies  cough, hemoptysis,  chest pain, palpitations, pedal edema, orthopnea, paroxysmal nocturnal dyspnea, lightheadedness, nausea, vomiting, abdominal or  leg pains      Objective:   Physical Exam  Gen. Pleasant, morbidly obese, in  no distress ENT - no lesions, no post nasal drip Neck: No JVD, no thyromegaly, no carotid bruits Lungs: no use of accessory muscles, no dullness to percussion, decreased without rales or rhonchi  Cardiovascular: Rhythm regular, heart sounds  normal, no murmurs or gallops, 1+ peripheral edema Musculoskeletal: No deformities, no cyanosis or clubbing , no tremors        Assessment & Plan:

## 2016-02-08 NOTE — Assessment & Plan Note (Signed)
Lasix per weight dosing

## 2016-02-08 NOTE — Assessment & Plan Note (Addendum)
Prednisone 10 mg tabs Take 4 tabs  daily with food x 4 days, then 3 tabs daily x 4 days, then 2 tabs daily x 4 days, then 1 tab daily  Refill on albuterol  I'm hopeful that in the long run, we can taper off steroids since she is on methotrexate now

## 2016-02-08 NOTE — Patient Instructions (Signed)
Prednisone 10 mg tabs Take 4 tabs  daily with food x 4 days, then 3 tabs daily x 4 days, then 2 tabs daily x 4 days, then 1 tab daily  Refill on albuterol Lasix per weight dosing

## 2016-02-08 NOTE — Assessment & Plan Note (Signed)
Ct 2L O2 cont x 24h

## 2016-02-09 NOTE — Assessment & Plan Note (Signed)
Continue CPAP 7 cm with 2 L oxygen blended in  Weight loss encouraged, compliance with goal of at least 4-6 hrs every night is the expectation. Advised against medications with sedative side effects Cautioned against driving when sleepy - understanding that sleepiness will vary on a day to day basis

## 2016-02-10 DIAGNOSIS — M1711 Unilateral primary osteoarthritis, right knee: Secondary | ICD-10-CM | POA: Diagnosis not present

## 2016-02-10 DIAGNOSIS — M1712 Unilateral primary osteoarthritis, left knee: Secondary | ICD-10-CM | POA: Diagnosis not present

## 2016-02-11 DIAGNOSIS — Z79899 Other long term (current) drug therapy: Secondary | ICD-10-CM | POA: Diagnosis not present

## 2016-02-11 DIAGNOSIS — E662 Morbid (severe) obesity with alveolar hypoventilation: Secondary | ICD-10-CM | POA: Diagnosis not present

## 2016-02-11 DIAGNOSIS — R768 Other specified abnormal immunological findings in serum: Secondary | ICD-10-CM | POA: Diagnosis not present

## 2016-02-11 DIAGNOSIS — M064 Inflammatory polyarthropathy: Secondary | ICD-10-CM | POA: Diagnosis not present

## 2016-02-11 DIAGNOSIS — R0602 Shortness of breath: Secondary | ICD-10-CM | POA: Diagnosis not present

## 2016-02-11 DIAGNOSIS — D860 Sarcoidosis of lung: Secondary | ICD-10-CM | POA: Diagnosis not present

## 2016-02-11 DIAGNOSIS — M159 Polyosteoarthritis, unspecified: Secondary | ICD-10-CM | POA: Diagnosis not present

## 2016-02-24 ENCOUNTER — Ambulatory Visit (INDEPENDENT_AMBULATORY_CARE_PROVIDER_SITE_OTHER): Payer: Medicare Other | Admitting: Adult Health

## 2016-02-24 ENCOUNTER — Encounter: Payer: Self-pay | Admitting: Adult Health

## 2016-02-24 VITALS — BP 150/92 | Temp 99.2°F | Wt >= 6400 oz

## 2016-02-24 DIAGNOSIS — I10 Essential (primary) hypertension: Secondary | ICD-10-CM

## 2016-02-24 DIAGNOSIS — E785 Hyperlipidemia, unspecified: Secondary | ICD-10-CM | POA: Insufficient documentation

## 2016-02-24 DIAGNOSIS — E038 Other specified hypothyroidism: Secondary | ICD-10-CM

## 2016-02-24 DIAGNOSIS — Z0000 Encounter for general adult medical examination without abnormal findings: Secondary | ICD-10-CM

## 2016-02-24 MED ORDER — LEVOTHYROXINE SODIUM 100 MCG PO TABS: 100.0000 ug | ORAL_TABLET | Freq: Every day | ORAL | Status: DC

## 2016-02-24 NOTE — Patient Instructions (Signed)
It was great seeing you again.   Please think about getting a colonoscopy and meeting with Bariatric Surgery.   I have sent in a new prescription for Synthroid  Restart the metoprolol.    Follow up in August for physical

## 2016-02-24 NOTE — Progress Notes (Signed)
Kim Reddish, MD Phone: [phone]  Subjective:  Patient presents today for their annual wellness visit.  She has  has a past medical history of Hypertension; Hypothyroidism; Anemia; Morbid obesity ([place]); Gallstones; Seasonal allergies; Chronic hyperventilation syndrome; Shortness of breath; Sleep apnea; GERD (gastroesophageal reflux disease); H/O hiatal hernia; Arthritis; Pneumonia; COPD (chronic obstructive pulmonary disease) ([place]); Sarcoidosis ([place]); and Kidney stones.  She has no followed up for her mammogram or GYN exam   She has stopped taking her Metoprolol   She does not want a colonoscopy.    Preventive Screening-Counseling & Management  Smoking Status: Never Smoker Second Hand Smoking status: No smokers in home  Risk Factors Regular exercise: Does not exercise regularly  Diet: Does not follow a diet Fall Risk:None   Cardiac risk factors:  advanced age (older than 57 for men, 16 for women)  Hyperlipidemia  Morbid Obesity No diabetes.  Family History: DM, DVT, Aneurysm   Depression Screen None. PHQ2 0   Activities of Daily Living Independent ADLs and IADLs   Hearing Difficulties: patient declines  Cognitive Testing No reported trouble.   Normal 3 word recall  List the Names of Other Physician/Practitioners you currently use: 1.Pulmonology - Dr. Shannan Harper, NP 2. Optho - [facility] 3. Cardiology - [name]  Immunization History  Administered Date(s) Administered  . Influenza Split 08/22/2011  . Influenza Whole 07/31/2012  . Influenza,inj,Quad PF,36+ Mos 09/16/2013, 07/11/2014, 07/11/2015  . Pneumococcal-Unspecified 08/01/2011   Required Immunizations needed today   Screening tests- up to date Health Maintenance Due  Topic Date Due  . HIV Screening  10/02/1980  . TETANUS/TDAP  10/02/1984  . PAP SMEAR  12/26/2014  . MAMMOGRAM  10/03/2015  . COLONOSCOPY  10/03/2015    ROS- No pertinent positives discovered in  course of AWV  The following were reviewed and entered/updated in epic: Past Medical History  Diagnosis Date  . Hypertension   . Hypothyroidism   . Anemia   . Morbid obesity ([place])   . Gallstones   . Seasonal allergies   . Chronic hyperventilation syndrome     w/ obesity tx with albuterol inhaler and oxygen 2L  . Shortness of breath   . Sleep apnea     uses CPAP machine   . GERD (gastroesophageal reflux disease)     diet controlled - no meds  . H/O hiatal hernia   . Arthritis     hands, shoulders, no meds  . Pneumonia     hoispitalized in 08/2011  . COPD (chronic obstructive pulmonary disease) ([place])     uses oxygen 2 L  . Sarcoidosis ([place])   . Kidney stones    Patient Active Problem List   Diagnosis Date Noted  . Hyperlipidemia 02/24/2016  . Chronic respiratory failure ([place]) 09/21/2015  . Absolute anemia   . Anxiety 04/14/2015  . Hypoxemia   . Congestive heart failure ([place])   . Symptomatic anemia 01/15/2015  . Hypothyroidism 01/15/2015  . Morbid obesity ([place]) 01/15/2015  . Fibroids 12/09/2013  . Hyperglycemia 09/16/2013  . Left knee pain 09/09/2012  . Sarcoidosis ([place]) 09/09/2012  . Iron deficiency anemia due to chronic blood loss 09/09/2012  . Hypertension 02/25/2011  . Obesity hypoventilation syndrome ([place]) 02/14/2011  . OSA (obstructive sleep apnea) 02/10/2011   Past Surgical History  Procedure Laterality Date  . Cholecystectomy  2000  . Cesarean section  1994    x 1  . I and d of abcess  05/2011  . Hysteroscopy w/d&c  12/27/2011  Procedure: DILATATION AND CURETTAGE /HYSTEROSCOPY;  Surgeon: [name]. Kim Mellow, MD;  Location: [facility];  Service: Gynecology;;  . Lung biopsy    . Uterine abletion      Family History  Problem Relation Age of Onset  . Diabetes Father   . Diabetes Brother   . Deep vein thrombosis Mother   . Aneurysm Sister     d/o brain aneurysm    Medications- reviewed and updated Current Outpatient Prescriptions  Medication Sig Dispense  Refill  . albuterol (PROVENTIL HFA;VENTOLIN HFA) 108 (90 Base) MCG/ACT inhaler Inhale 1-2 puffs into the lungs every 6 (six) hours as needed for wheezing. 1 Inhaler 0  . albuterol (PROVENTIL HFA;VENTOLIN HFA) 108 (90 Base) MCG/ACT inhaler Inhale 1-2 puffs into the lungs every 6 (six) hours as needed for wheezing. 1 Inhaler 0  . cetirizine (ZYRTEC) 10 MG tablet Take 10 mg by mouth at bedtime as needed for allergies.    . cholecalciferol (VITAMIN D) 400 UNITS TABS tablet Take 400 Units by mouth daily.    . [place] Calcium 1000 (390 CA) MG TABS Take 1 tablet by mouth daily.    . ferrous sulfate 325 (65 FE) MG tablet Take 1 tablet (325 mg total) by mouth 3 (three) times daily with meals. 270 tablet 11  . furosemide (LASIX) 40 MG tablet Take 1 tablet (40 mg total) by mouth daily. 30 tablet 1  . HYDROcodone-acetaminophen (NORCO/VICODIN) 5-325 MG per tablet Take 1 tablet by mouth every 6 (six) hours as needed for moderate pain.    Marland Kitchen levothyroxine (SYNTHROID, LEVOTHROID) 100 MCG tablet Take 1 tablet (100 mcg total) by mouth daily at 6 (six) AM. 30 tablet 4  . losartan (COZAAR) 50 MG tablet TAKE 1 TABLET (50 MG TOTAL) BY MOUTH DAILY. 30 tablet 5  . metoprolol succinate (TOPROL-XL) 25 MG 24 hr tablet TAKE 1 TABLET (25 MG TOTAL) BY MOUTH DAILY. 30 tablet 5  . NON FORMULARY 2 liter of oxygen    . predniSONE (DELTASONE) 10 MG tablet Take 2 tablets (20 mg total) by mouth at bedtime.    . predniSONE (DELTASONE) 10 MG tablet Take 4 tabs x 4 days, then 3 x 4 days, then 2 x 4 days then 1 tablet daily 100 tablet 0  . triamcinolone acetonide (KENALOG) 40 MG/ML injection Inject 40 mg into the muscle every 3 (three) months. For knee pain    . triamcinolone ointment (KENALOG) 0.1 % Apply 1 application topically 2 (two) times daily. Once or twice weekly as needed for break out of Sarcoidosis     No current facility-administered medications for this visit.    Allergies-reviewed and updated No Known Allergies  Social  History   Social History  . Marital Status: Single    Spouse Name: N/A  . Number of Children: N/A  . Years of Education: N/A   Occupational History  . unemployeed    Social History Main Topics  . Smoking status: Never Smoker   . Smokeless tobacco: Never Used  . Alcohol Use: Yes     Comment: occasionally  . Drug Use: No  . Sexual Activity: No   Other Topics Concern  . None   Social History Narrative   Is not currently working.    Divorced for eight or nine years   Has one son who lives around here.        Objective: BP 150/92 mmHg  Temp(Src) 99.2 F (37.3 C) (Oral)  Wt 493 lb 11.2 oz (223.941 kg) Constitutional:  She is oriented to person, place, and time. She appears well-developed and well-nourished. No distress.  Morbidly obese  HENT:  Head: Normocephalic and atraumatic.  Right Ear: External ear normal.  Left Ear: External ear normal.  Nose: Nose normal.  Mouth/Throat: Oropharynx is clear and moist.  Eyes: Conjunctivae and EOM are normal. Pupils are equal, round, and reactive to light. Right eye exhibits no discharge. Left eye exhibits no discharge. No scleral icterus.  Neck: Normal range of motion. Neck supple. No thyromegaly present.  Cardiovascular: Normal rate, regular rhythm, normal heart sounds and intact distal pulses. Exam reveals no gallop and no friction rub.  No murmur heard. Pulmonary/Chest: Effort normal and breath sounds normal. No respiratory distress. She has no wheezes. She has no rales. She exhibits no tenderness.  On 2L via [place] becomes SOB with mild exertion  Abdominal: Soft. Bowel sounds are normal. She exhibits no distension. There is no tenderness. There is no rebound and no guarding.  Musculoskeletal: She exhibits edema (bilateral lower extremities). She exhibits no tenderness.  Lymphadenopathy:   She has no cervical adenopathy.  Neurological: She is alert and oriented to person, place, and time. She has normal reflexes. No cranial  nerve deficit. Coordination normal.  Skin: Skin is warm and dry. No rash noted. She is not diaphoretic. No erythema. No pallor.  Psychiatric: She has a normal mood and affect. Her behavior is normal. Judgment and thought content normal.  Nursing note and vitals reviewed.  Assessment/Plan: 1. Medicare annual wellness visit, subsequent - Reviewed medications and recent labs - Advised weight loss and diet - We reviewed risks of not having a colonoscopy- including death. She is still ok with not having one.  - I would like her to meet with Bariatric surgery.  - Information given on advanced directive and living will   2. Morbid obesity, unspecified obesity type ([place]) - She does not want to meet with Bariatric surgery due to " the process takes to long".  - She does not want to go on medications for weight loss, due to the fact that " They do not work."  3. Essential hypertension - Needs to take Metoprolol   4. Other specified hypothyroidism - Synthroid refilled.   [name], NP

## 2016-02-26 ENCOUNTER — Encounter (HOSPITAL_COMMUNITY): Payer: Self-pay | Admitting: Emergency Medicine

## 2016-02-26 ENCOUNTER — Inpatient Hospital Stay (HOSPITAL_COMMUNITY)
Admission: EM | Admit: 2016-02-26 | Discharge: 2016-02-28 | DRG: 392 | Disposition: A | Discharge status: Home / Self Care | Payer: Medicare Other | Attending: Internal Medicine | Admitting: Internal Medicine

## 2016-02-26 ENCOUNTER — Emergency Department (HOSPITAL_COMMUNITY): Payer: Medicare Other

## 2016-02-26 DIAGNOSIS — R1032 Left lower quadrant pain: Secondary | ICD-10-CM

## 2016-02-26 DIAGNOSIS — I110 Hypertensive heart disease with heart failure: Secondary | ICD-10-CM | POA: Diagnosis not present

## 2016-02-26 DIAGNOSIS — Z79899 Other long term (current) drug therapy: Secondary | ICD-10-CM

## 2016-02-26 DIAGNOSIS — K219 Gastro-esophageal reflux disease without esophagitis: Secondary | ICD-10-CM | POA: Diagnosis present

## 2016-02-26 DIAGNOSIS — E662 Morbid (severe) obesity with alveolar hypoventilation: Secondary | ICD-10-CM | POA: Diagnosis not present

## 2016-02-26 DIAGNOSIS — D509 Iron deficiency anemia, unspecified: Secondary | ICD-10-CM | POA: Diagnosis present

## 2016-02-26 DIAGNOSIS — I5032 Chronic diastolic (congestive) heart failure: Secondary | ICD-10-CM | POA: Diagnosis not present

## 2016-02-26 DIAGNOSIS — Z9981 Dependence on supplemental oxygen: Secondary | ICD-10-CM

## 2016-02-26 DIAGNOSIS — I10 Essential (primary) hypertension: Secondary | ICD-10-CM | POA: Diagnosis present

## 2016-02-26 DIAGNOSIS — K5792 Diverticulitis of intestine, part unspecified, without perforation or abscess without bleeding: Secondary | ICD-10-CM | POA: Diagnosis not present

## 2016-02-26 DIAGNOSIS — R0602 Shortness of breath: Secondary | ICD-10-CM

## 2016-02-26 DIAGNOSIS — R109 Unspecified abdominal pain: Secondary | ICD-10-CM

## 2016-02-26 DIAGNOSIS — I509 Heart failure, unspecified: Secondary | ICD-10-CM

## 2016-02-26 DIAGNOSIS — F419 Anxiety disorder, unspecified: Secondary | ICD-10-CM | POA: Diagnosis present

## 2016-02-26 DIAGNOSIS — E039 Hypothyroidism, unspecified: Secondary | ICD-10-CM | POA: Diagnosis present

## 2016-02-26 DIAGNOSIS — Z6841 Body Mass Index (BMI) 40.0 and over, adult: Secondary | ICD-10-CM | POA: Diagnosis not present

## 2016-02-26 DIAGNOSIS — D869 Sarcoidosis, unspecified: Secondary | ICD-10-CM | POA: Diagnosis present

## 2016-02-26 DIAGNOSIS — J449 Chronic obstructive pulmonary disease, unspecified: Secondary | ICD-10-CM | POA: Diagnosis present

## 2016-02-26 DIAGNOSIS — G4733 Obstructive sleep apnea (adult) (pediatric): Secondary | ICD-10-CM | POA: Diagnosis present

## 2016-02-26 LAB — CBC WITH DIFFERENTIAL/PLATELET
BASOS ABS: 0 10*3/uL (ref 0.0–0.1)
BASOS PCT: 0 %
EOS PCT: 0 %
Eosinophils Absolute: 0 10*3/uL (ref 0.0–0.7)
HEMATOCRIT: 34.3 % — AB (ref 36.0–46.0)
Hemoglobin: 9.2 g/dL — ABNORMAL LOW (ref 12.0–15.0)
LYMPHS ABS: 1.7 10*3/uL (ref 0.7–4.0)
Lymphocytes Relative: 9 %
MCH: 18.5 pg — AB (ref 26.0–34.0)
MCHC: 26.8 g/dL — AB (ref 30.0–36.0)
MCV: 68.9 fL — AB (ref 78.0–100.0)
Monocytes Absolute: 1.3 10*3/uL — ABNORMAL HIGH (ref 0.1–1.0)
Monocytes Relative: 7 %
NEUTROS ABS: 16.2 10*3/uL — AB (ref 1.7–7.7)
NEUTROS PCT: 84 %
Platelets: 313 10*3/uL (ref 150–400)
RBC: 4.98 MIL/uL (ref 3.87–5.11)
RDW: 25 % — AB (ref 11.5–15.5)
WBC: 19.2 10*3/uL — AB (ref 4.0–10.5)

## 2016-02-26 LAB — BASIC METABOLIC PANEL
Anion gap: 10 (ref 5–15)
BUN: 17 mg/dL (ref 6–20)
CHLORIDE: 100 mmol/L — AB (ref 101–111)
CO2: 30 mmol/L (ref 22–32)
CREATININE: 0.89 mg/dL (ref 0.44–1.00)
Calcium: 9.2 mg/dL (ref 8.9–10.3)
Glucose, Bld: 109 mg/dL — ABNORMAL HIGH (ref 65–99)
Potassium: 4.4 mmol/L (ref 3.5–5.1)
SODIUM: 140 mmol/L (ref 135–145)

## 2016-02-26 LAB — URINE MICROSCOPIC-ADD ON

## 2016-02-26 LAB — BRAIN NATRIURETIC PEPTIDE: B Natriuretic Peptide: 42.9 pg/mL (ref 0.0–100.0)

## 2016-02-26 LAB — URINALYSIS, ROUTINE W REFLEX MICROSCOPIC
Bilirubin Urine: NEGATIVE
GLUCOSE, UA: NEGATIVE mg/dL
Ketones, ur: NEGATIVE mg/dL
LEUKOCYTES UA: NEGATIVE
Nitrite: NEGATIVE
PH: 6.5 (ref 5.0–8.0)
Protein, ur: NEGATIVE mg/dL
SPECIFIC GRAVITY, URINE: 1.023 (ref 1.005–1.030)

## 2016-02-26 LAB — I-STAT TROPONIN, ED: Troponin i, poc: 0 ng/mL (ref 0.00–0.08)

## 2016-02-26 MED ORDER — HYDROCODONE-ACETAMINOPHEN 5-325 MG PO TABS
1.0000 | ORAL_TABLET | Freq: Once | ORAL | Status: AC
  Administered 2016-02-26: 1 via ORAL
  Filled 2016-02-26: qty 1

## 2016-02-26 MED ORDER — KETOROLAC TROMETHAMINE 30 MG/ML IJ SOLN: 30.0000 mg | Freq: Once | INTRAMUSCULAR | Status: DC

## 2016-02-26 MED ORDER — FUROSEMIDE 10 MG/ML IJ SOLN
40.0000 mg | Freq: Once | INTRAMUSCULAR | Status: DC
  Filled 2016-02-26: qty 4

## 2016-02-26 NOTE — ED Provider Notes (Signed)
CSN: [id number]     Arrival date & time 02/26/16  2013 History   First MD Initiated Contact with Patient 02/26/16 2116     Chief Complaint  Patient presents with  . Shortness of Breath    HPI Comments: 50 year old female presents with increasing SOB for the past 3 days and left-sided abdominal pain. PMH significant for Sardoidosis, CHF, COPD, morbid obesity, kidney stones, hypertension, hyperlipidemia. She is normally on 2 L of oxygen 24/7. She has chronic shortness of breath and has recently finished a steroid burst on Monday to help with her breathing. She has been taking additional doses of Lasix earlier this week as well due to increased daily weights. She is also reporting left-sided abdominal pain which she thinks might be kidney stones. She has never had pain from kidney stone before. Her doctor diagnosed with kidney stones after she was having hematuria. Reporting associated diarrhea. Denies fever, chills, chest pain, cough, nausea, vomiting, blood in stool, dysuria, blood in urine.   Patient is a 50 y.o. female presenting with shortness of breath.  Shortness of Breath Associated symptoms: abdominal pain and wheezing   Associated symptoms: no cough, no fever and no vomiting     Past Medical History  Diagnosis Date  . Hypertension   . Hypothyroidism   . Anemia   . Morbid obesity ([place])   . Gallstones   . Seasonal allergies   . Chronic hyperventilation syndrome     w/ obesity tx with albuterol inhaler and oxygen 2L  . Shortness of breath   . Sleep apnea     uses CPAP machine   . GERD (gastroesophageal reflux disease)     diet controlled - no meds  . H/O hiatal hernia   . Arthritis     hands, shoulders, no meds  . Pneumonia     hoispitalized in 08/2011  . COPD (chronic obstructive pulmonary disease) ([place])     uses oxygen 2 L  . Sarcoidosis ([place])   . Kidney stones    Past Surgical History  Procedure Laterality Date  . Cholecystectomy  2000  . Cesarean section  1994     x 1  . I and d of abcess  05/2011  . Hysteroscopy w/d&c  12/27/2011    Procedure: DILATATION AND CURETTAGE /HYSTEROSCOPY;  Surgeon: Maeola Sarah. Landry Mellow, MD;  Location: [facility];  Service: Gynecology;;  . Lung biopsy    . Uterine abletion     Family History  Problem Relation Age of Onset  . Diabetes Father   . Diabetes Brother   . Deep vein thrombosis Mother   . Aneurysm Sister     d/o brain aneurysm   Social History  Substance Use Topics  . Smoking status: Never Smoker   . Smokeless tobacco: Never Used  . Alcohol Use: Yes     Comment: occasionally   OB History    No data available     Review of Systems  Constitutional: Negative for fever and chills.  Respiratory: Positive for shortness of breath and wheezing. Negative for cough.   Gastrointestinal: Positive for abdominal pain and diarrhea. Negative for nausea, vomiting, constipation and blood in stool.  Genitourinary: Positive for flank pain. Negative for dysuria and hematuria.  All other systems reviewed and are negative.     Allergies  Review of patient's allergies indicates no known allergies.  Home Medications   Prior to Admission medications   Medication Sig Start Date End Date Taking? Authorizing Provider  acetaminophen (  TYLENOL) 500 MG tablet Take 500-1,000 mg by mouth every 6 (six) hours as needed for moderate pain.   Yes Historical Provider, MD  albuterol (PROVENTIL HFA;VENTOLIN HFA) 108 (90 Base) MCG/ACT inhaler Inhale 1-2 puffs into the lungs every 6 (six) hours as needed for wheezing. 02/08/16  Yes Rigoberto Noel, MD  cetirizine (ZYRTEC) 10 MG tablet Take 10 mg by mouth at bedtime as needed for allergies.   Yes Historical Provider, MD  ferrous sulfate 325 (65 FE) MG tablet Take 1 tablet (325 mg total) by mouth 3 (three) times daily with meals. 08/31/15  Yes [name], NP  folic acid (FOLVITE) 1 MG tablet Take 1 mg by mouth daily. 01/11/16  Yes Historical Provider, MD  furosemide (LASIX) 40 MG tablet Take 1 tablet (40  mg total) by mouth daily. 01/19/15  Yes Orson Eva, MD  HYDROcodone-acetaminophen (NORCO/VICODIN) 5-325 MG per tablet Take 1 tablet by mouth every 6 (six) hours as needed for moderate pain.   Yes Historical Provider, MD  levothyroxine (SYNTHROID, LEVOTHROID) 100 MCG tablet Take 1 tablet (100 mcg total) by mouth daily at 6 (six) AM. 02/24/16  Yes [name], NP  losartan (COZAAR) 50 MG tablet TAKE 1 TABLET (50 MG TOTAL) BY MOUTH DAILY. 08/20/15  Yes [name], NP  methotrexate 50 MG/2ML injection Inject 50 mg into the skin every Friday. 12/13/15  Yes Historical Provider, MD  metoprolol succinate (TOPROL-XL) 25 MG 24 hr tablet TAKE 1 TABLET (25 MG TOTAL) BY MOUTH DAILY. 08/20/15  Yes [name], NP  NON FORMULARY 2 liter of oxygen   Yes Historical Provider, MD  predniSONE (DELTASONE) 10 MG tablet Take 2 tablets (20 mg total) by mouth at bedtime. 07/11/15  Yes Modena Jansky, MD  triamcinolone acetonide (KENALOG) 40 MG/ML injection Inject 40 mg into the muscle every 3 (three) months. For knee pain   Yes Historical Provider, MD  triamcinolone ointment (KENALOG) 0.1 % Apply 1 application topically 2 (two) times daily. Once or twice weekly as needed for break out of Sarcoidosis   Yes Historical Provider, MD  albuterol (PROVENTIL HFA;VENTOLIN HFA) 108 (90 Base) MCG/ACT inhaler Inhale 1-2 puffs into the lungs every 6 (six) hours as needed for wheezing. Patient not taking: Reported on 02/26/2016 02/08/16   Rigoberto Noel, MD  cholecalciferol (VITAMIN D) 400 UNITS TABS tablet Take 400 Units by mouth daily.    Historical Provider, MD  Coral Calcium 1000 (390 CA) MG TABS Take 1 tablet by mouth daily.    Historical Provider, MD  predniSONE (DELTASONE) 10 MG tablet Take 4 tabs x 4 days, then 3 x 4 days, then 2 x 4 days then 1 tablet daily Patient not taking: Reported on 02/26/2016 02/08/16   Rigoberto Noel, MD   BP 169/86 mmHg  Pulse 104  Temp(Src) 98.6 F (37 C) (Oral)  Resp 22  SpO2 98%  LMP 02/12/2016    Physical Exam  Constitutional: She is oriented to person, place, and time. No distress.  Morbidly obese  HENT:  Head: Normocephalic and atraumatic.  Eyes: Conjunctivae are normal. Pupils are equal, round, and reactive to light. Right eye exhibits no discharge. Left eye exhibits no discharge. No scleral icterus.  Neck: Normal range of motion.  Cardiovascular: Normal rate and regular rhythm.  Exam reveals no gallop and no friction rub.   No murmur heard. Pulmonary/Chest: Effort normal and breath sounds normal. No respiratory distress. She has no wheezes. She has no rales. She exhibits no tenderness.  Abdominal: Soft. Bowel sounds are normal. She exhibits no distension and no mass. There is tenderness. There is no rebound and no guarding.  LLQ pain and LUQ pain  Neurological: She is alert and oriented to person, place, and time.  Skin: Skin is warm and dry.  Psychiatric: She has a normal mood and affect.    ED Course  Procedures (including critical care time) Labs Review Labs Reviewed  BASIC METABOLIC PANEL - Abnormal; Notable for the following:    Chloride 100 (*)    Glucose, Bld 109 (*)    All other components within normal limits  CBC WITH DIFFERENTIAL/PLATELET - Abnormal; Notable for the following:    WBC 19.2 (*)    Hemoglobin 9.2 (*)    HCT 34.3 (*)    MCV 68.9 (*)    MCH 18.5 (*)    MCHC 26.8 (*)    RDW 25.0 (*)    Neutro Abs 16.2 (*)    Monocytes Absolute 1.3 (*)    All other components within normal limits  URINALYSIS, ROUTINE W REFLEX MICROSCOPIC (NOT AT [facility]) - Abnormal; Notable for the following:    Hgb urine dipstick SMALL (*)    All other components within normal limits  URINE MICROSCOPIC-ADD ON - Abnormal; Notable for the following:    Squamous Epithelial / LPF 6-30 (*)    Bacteria, UA FEW (*)    All other components within normal limits  URINE CULTURE  BRAIN NATRIURETIC PEPTIDE  I-STAT TROPOININ, ED    Imaging Review Dg Chest 2 View  02/26/2016   CLINICAL DATA:  Subacute onset of shortness of breath and generalized chest tightness. Left-sided chest pain. Initial encounter. EXAM: CHEST  2 VIEW COMPARISON:  Chest radiograph performed 07/08/2015 FINDINGS: The lungs are well-aerated. Vascular congestion is noted. Mildly increased interstitial markings may reflect minimal interstitial edema. Mild bilateral atelectasis is seen. There is no evidence of pleural effusion or pneumothorax. The heart is borderline enlarged. No acute osseous abnormalities are seen. Clips are noted within the right upper quadrant, reflecting prior cholecystectomy. IMPRESSION: Vascular congestion and borderline cardiomegaly. Mildly increased interstitial markings may reflect minimal interstitial edema. Mild bilateral atelectasis seen. Electronically Signed   By: Garald Balding M.D.   On: 02/26/2016 22:14   I have personally reviewed and evaluated these images and lab results as part of my medical decision-making.   EKG Interpretation   Date/Time:  Friday February 26 2016 [id number] EDT Ventricular Rate:  98 PR Interval:  146 QRS Duration: 86 QT Interval:  359 QTC Calculation: 458 R Axis:   78 Text Interpretation:  Sinus rhythm Probable left atrial enlargement  Confirmed by Hazle Coca [phone]) on 02/26/2016 9:29:16 PM      MDM   Final diagnoses:  SOB (shortness of breath)  LLQ abdominal pain   50 year old female with multiple medical problems presents with shortness breath and abdominal pain.  Chest x-ray shows vascular congestion, borderline cardiomegaly, increased interstitial markings. BNP is 42.9. Lungs are clear although she does have 1+ pitting edema. Do not feel that she is volume overloaded this time. O2 sats are 98-100%.  CBC is remarkable for leukocytosis at night 19.2. Also shows anemia which is a chronic problem for her. BMP is unremarkable  Troponin is 0. EKG is reassuring and is normal sinus rhythm  She's having significant lower left quadrant pain.  Norco given. We'll get CT scan to rule out intra-abdominal pathology. Due to body habitus she will need [facility].  Recardo Evangelist, PA-C 02/27/16 0105  Quintella Reichert, MD 02/27/16 [phone]

## 2016-02-26 NOTE — ED Notes (Signed)
Pt presents with increased shortness of breath and chest tightness x 2 weeks. New L sided flank pain, recent hx of kidney stones. Pt is on 2L 02 by [place]

## 2016-02-27 ENCOUNTER — Encounter (HOSPITAL_COMMUNITY): Payer: Self-pay | Admitting: Family Medicine

## 2016-02-27 DIAGNOSIS — R1032 Left lower quadrant pain: Secondary | ICD-10-CM | POA: Diagnosis not present

## 2016-02-27 DIAGNOSIS — I5032 Chronic diastolic (congestive) heart failure: Secondary | ICD-10-CM

## 2016-02-27 DIAGNOSIS — J449 Chronic obstructive pulmonary disease, unspecified: Secondary | ICD-10-CM | POA: Diagnosis present

## 2016-02-27 DIAGNOSIS — E039 Hypothyroidism, unspecified: Secondary | ICD-10-CM | POA: Diagnosis present

## 2016-02-27 DIAGNOSIS — E038 Other specified hypothyroidism: Secondary | ICD-10-CM | POA: Diagnosis not present

## 2016-02-27 DIAGNOSIS — Z79899 Other long term (current) drug therapy: Secondary | ICD-10-CM | POA: Diagnosis not present

## 2016-02-27 DIAGNOSIS — D509 Iron deficiency anemia, unspecified: Secondary | ICD-10-CM | POA: Diagnosis present

## 2016-02-27 DIAGNOSIS — E662 Morbid (severe) obesity with alveolar hypoventilation: Secondary | ICD-10-CM | POA: Diagnosis not present

## 2016-02-27 DIAGNOSIS — D869 Sarcoidosis, unspecified: Secondary | ICD-10-CM | POA: Diagnosis present

## 2016-02-27 DIAGNOSIS — Z9981 Dependence on supplemental oxygen: Secondary | ICD-10-CM | POA: Diagnosis not present

## 2016-02-27 DIAGNOSIS — K219 Gastro-esophageal reflux disease without esophagitis: Secondary | ICD-10-CM | POA: Diagnosis present

## 2016-02-27 DIAGNOSIS — G4733 Obstructive sleep apnea (adult) (pediatric): Secondary | ICD-10-CM

## 2016-02-27 DIAGNOSIS — I10 Essential (primary) hypertension: Secondary | ICD-10-CM | POA: Diagnosis not present

## 2016-02-27 DIAGNOSIS — I110 Hypertensive heart disease with heart failure: Secondary | ICD-10-CM | POA: Diagnosis present

## 2016-02-27 DIAGNOSIS — K5792 Diverticulitis of intestine, part unspecified, without perforation or abscess without bleeding: Secondary | ICD-10-CM | POA: Diagnosis present

## 2016-02-27 DIAGNOSIS — F419 Anxiety disorder, unspecified: Secondary | ICD-10-CM | POA: Diagnosis present

## 2016-02-27 DIAGNOSIS — R109 Unspecified abdominal pain: Secondary | ICD-10-CM

## 2016-02-27 DIAGNOSIS — Z6841 Body Mass Index (BMI) 40.0 and over, adult: Secondary | ICD-10-CM | POA: Diagnosis not present

## 2016-02-27 LAB — CBG MONITORING, ED: Glucose-Capillary: 140 mg/dL — ABNORMAL HIGH (ref 65–99)

## 2016-02-27 LAB — I-STAT BETA HCG BLOOD, ED (MC, WL, AP ONLY): I-stat hCG, quantitative: <5 m[IU]/mL (ref <5)

## 2016-02-27 MED ORDER — FUROSEMIDE 10 MG/ML IJ SOLN
40.0000 mg | Freq: Once | INTRAMUSCULAR | Status: DC
  Filled 2016-02-27: qty 4

## 2016-02-27 MED ORDER — LEVOTHYROXINE SODIUM 100 MCG PO TABS
100.0000 ug | ORAL_TABLET | Freq: Every day | ORAL | Status: DC
  Administered 2016-02-28: 100 ug via ORAL
  Filled 2016-02-27: qty 1

## 2016-02-27 MED ORDER — METHOTREXATE SODIUM CHEMO INJECTION 50 MG/2ML: 50.0000 mg | INTRAMUSCULAR | Status: DC

## 2016-02-27 MED ORDER — METHYLPREDNISOLONE SODIUM SUCC 125 MG IJ SOLR
125.0000 mg | Freq: Once | INTRAMUSCULAR | Status: AC
  Administered 2016-02-27: 125 mg via INTRAVENOUS
  Filled 2016-02-27: qty 2

## 2016-02-27 MED ORDER — FERROUS SULFATE 325 (65 FE) MG PO TABS
325.0000 mg | ORAL_TABLET | Freq: Three times a day (TID) | ORAL | Status: DC
  Administered 2016-02-27 – 2016-02-28 (×4): 325 mg via ORAL
  Filled 2016-02-27 (×4): qty 1

## 2016-02-27 MED ORDER — IPRATROPIUM-ALBUTEROL 0.5-2.5 (3) MG/3ML IN SOLN
3.0000 mL | Freq: Once | RESPIRATORY_TRACT | Status: AC
  Administered 2016-02-27: 3 mL via RESPIRATORY_TRACT
  Filled 2016-02-27: qty 3

## 2016-02-27 MED ORDER — ONDANSETRON HCL 4 MG/2ML IJ SOLN
4.0000 mg | Freq: Once | INTRAMUSCULAR | Status: AC
  Administered 2016-02-27: 4 mg via INTRAVENOUS
  Filled 2016-02-27: qty 2

## 2016-02-27 MED ORDER — ONDANSETRON HCL 4 MG PO TABS: 4.0000 mg | ORAL_TABLET | Freq: Four times a day (QID) | ORAL | Status: DC | PRN

## 2016-02-27 MED ORDER — METRONIDAZOLE IN NACL 5-0.79 MG/ML-% IV SOLN
500.0000 mg | Freq: Once | INTRAVENOUS | Status: AC
  Administered 2016-02-27: 500 mg via INTRAVENOUS
  Filled 2016-02-27: qty 100

## 2016-02-27 MED ORDER — SODIUM CHLORIDE 0.9% FLUSH: 3.0000 mL | INTRAVENOUS | Status: DC | PRN

## 2016-02-27 MED ORDER — PREDNISONE 20 MG PO TABS: 20.0000 mg | ORAL_TABLET | Freq: Every day | ORAL | Status: DC

## 2016-02-27 MED ORDER — HYDROCODONE-ACETAMINOPHEN 5-325 MG PO TABS
1.0000 | ORAL_TABLET | Freq: Four times a day (QID) | ORAL | Status: DC | PRN
  Administered 2016-02-27: 1 via ORAL
  Filled 2016-02-27: qty 1

## 2016-02-27 MED ORDER — SODIUM CHLORIDE 0.9% FLUSH
3.0000 mL | Freq: Two times a day (BID) | INTRAVENOUS | Status: DC
  Administered 2016-02-27: 3 mL via INTRAVENOUS

## 2016-02-27 MED ORDER — IPRATROPIUM-ALBUTEROL 0.5-2.5 (3) MG/3ML IN SOLN
3.0000 mL | RESPIRATORY_TRACT | Status: DC | PRN
  Filled 2016-02-27: qty 3

## 2016-02-27 MED ORDER — METOPROLOL SUCCINATE ER 25 MG PO TB24
25.0000 mg | ORAL_TABLET | Freq: Every day | ORAL | Status: DC
  Administered 2016-02-27 – 2016-02-28 (×2): 25 mg via ORAL
  Filled 2016-02-27 (×3): qty 1

## 2016-02-27 MED ORDER — CORAL CALCIUM 1000 (390 CA) MG PO TABS: 1.0000 | ORAL_TABLET | Freq: Every day | ORAL | Status: DC

## 2016-02-27 MED ORDER — ONDANSETRON HCL 4 MG/2ML IJ SOLN: 4.0000 mg | Freq: Four times a day (QID) | INTRAMUSCULAR | Status: DC | PRN

## 2016-02-27 MED ORDER — METHYLPREDNISOLONE SODIUM SUCC 125 MG IJ SOLR
60.0000 mg | Freq: Four times a day (QID) | INTRAMUSCULAR | Status: DC
  Administered 2016-02-27 – 2016-02-28 (×3): 60 mg via INTRAVENOUS
  Filled 2016-02-27 (×4): qty 2

## 2016-02-27 MED ORDER — METRONIDAZOLE IN NACL 5-0.79 MG/ML-% IV SOLN
500.0000 mg | Freq: Three times a day (TID) | INTRAVENOUS | Status: DC
  Administered 2016-02-27 – 2016-02-28 (×3): 500 mg via INTRAVENOUS
  Filled 2016-02-27 (×4): qty 100

## 2016-02-27 MED ORDER — LOSARTAN POTASSIUM 50 MG PO TABS
50.0000 mg | ORAL_TABLET | Freq: Every day | ORAL | Status: DC
  Administered 2016-02-27 – 2016-02-28 (×2): 50 mg via ORAL
  Filled 2016-02-27 (×2): qty 1

## 2016-02-27 MED ORDER — POLYETHYLENE GLYCOL 3350 17 G PO PACK: 17.0000 g | PACK | Freq: Every day | ORAL | Status: DC | PRN

## 2016-02-27 MED ORDER — BISACODYL 5 MG PO TBEC: 5.0000 mg | DELAYED_RELEASE_TABLET | Freq: Every day | ORAL | Status: DC | PRN

## 2016-02-27 MED ORDER — ACETAMINOPHEN 500 MG PO TABS: 500.0000 mg | ORAL_TABLET | Freq: Four times a day (QID) | ORAL | Status: DC | PRN

## 2016-02-27 MED ORDER — CHOLECALCIFEROL 10 MCG (400 UNIT) PO TABS
400.0000 [IU] | ORAL_TABLET | Freq: Every day | ORAL | Status: DC
  Administered 2016-02-27 – 2016-02-28 (×2): 400 [IU] via ORAL
  Filled 2016-02-27 (×2): qty 1

## 2016-02-27 MED ORDER — ENOXAPARIN SODIUM 40 MG/0.4ML ~~LOC~~ SOLN: 40.0000 mg | SUBCUTANEOUS | Status: DC

## 2016-02-27 MED ORDER — ENOXAPARIN SODIUM 120 MG/0.8ML ~~LOC~~ SOLN
0.5000 mg/kg | SUBCUTANEOUS | Status: DC
  Administered 2016-02-27: 115 mg via SUBCUTANEOUS
  Filled 2016-02-27 (×2): qty 0.75

## 2016-02-27 MED ORDER — SODIUM CHLORIDE 0.9 % IV SOLN: 250.0000 mL | INTRAVENOUS | Status: DC | PRN

## 2016-02-27 MED ORDER — FOLIC ACID 1 MG PO TABS
1.0000 mg | ORAL_TABLET | Freq: Every day | ORAL | Status: DC
  Administered 2016-02-27 – 2016-02-28 (×2): 1 mg via ORAL
  Filled 2016-02-27 (×2): qty 1

## 2016-02-27 MED ORDER — CIPROFLOXACIN IN D5W 400 MG/200ML IV SOLN
400.0000 mg | Freq: Two times a day (BID) | INTRAVENOUS | Status: DC
  Administered 2016-02-27 (×2): 400 mg via INTRAVENOUS
  Filled 2016-02-27 (×3): qty 200

## 2016-02-27 NOTE — ED Notes (Signed)
CBG 140  

## 2016-02-27 NOTE — H&P (Signed)
History and Physical    CAMDYN BOUSQUET [id number] DOB: 09/17/1966 DOA: 02/26/2016  Referring Provider: Dr. Betsey Holiday (EDP) PCP: [name], NP  Outpatient Specialists: Dr. Elsworth Soho (pulmonology), Dr. [name] (cardiology)   Patient coming from: Home   Chief Complaint: SOB, LLQ abd pain  HPI: [name] is a 50 y.o. female with medical history significant for BMI of 80, chronic diastolic CHF, OSA, obesity hypoventilation syndrome, hypothyroidism, and sarcoidosis who presented to the [facility] ED this morning for evaluation of acute left lower quadrant abdominal pain and increased dyspnea for the past 2 weeks.patient denies fevers or chills and denies dysuria or hematuria. She describes her abdominal pain as localized to the left lower quadrant, constant, moderate in intensity, burning in character, and with no relieving or exacerbating factors identified. She has not experienced similar pain previously. She is chronically dyspneic and on 2 L/m supplemental oxygen around the clock at home. She has been slightly more short of breath for the past 2 weeks saw her pulmonologist, and was advised that she may need to increase her Lasix dose. Patient endorses orthopnea and chronic bilateral lower extremity edema, but reports that these are largely unchanged. She denies chest pain or palpitations and denies cough.  ED Course: Upon arrival to the ED at [facility], patient was found to be afebrile,saturatingadequately on 2 L/m, modestly hypertensive, but with vital signs stable. Chest x-ray was obtained and notable for vascular congestion and borderline cardiomegaly. EKG demonstrates a normal sinus rhythm. BNP is 43 and troponin is undetectable. Urine was obtained for analysis and features small hemoglobin but is negative for signs of infection. There was plans to evaluate the patient's abdominal pain with CT scan, however, the scanner at [facility] could not accommodate her weight. Arrangements were  made for transport to the [facility] emergency department where the CT scanner can tolerate the patient's weight. Unfortunately,upon her arrival at [facility], it was determined that the CT scanner could not accommodate her girth. Patient was given 125 mg IV Solu-Medrol, 40 mg IV push of Lasix, and a DuoNeb for her respiratory complaints. Cipro and Flagyl were initiated IV for empiric treatment of suspected diverticulitis. Patient will be admitted to the hospital for ongoing evaluation and management of increased dyspnea and left lower quadrant abdominal pain.  Review of Systems:  All other systems reviewed and apart from HPI, are negative.  Past Medical History  Diagnosis Date  . Hypertension   . Hypothyroidism   . Anemia   . Morbid obesity ([place])   . Gallstones   . Seasonal allergies   . Chronic hyperventilation syndrome     w/ obesity tx with albuterol inhaler and oxygen 2L  . Shortness of breath   . Sleep apnea     uses CPAP machine   . GERD (gastroesophageal reflux disease)     diet controlled - no meds  . H/O hiatal hernia   . Arthritis     hands, shoulders, no meds  . Pneumonia     hoispitalized in 08/2011  . COPD (chronic obstructive pulmonary disease) ([place])     uses oxygen 2 L  . Sarcoidosis ([place])   . Kidney stones     Past Surgical History  Procedure Laterality Date  . Cholecystectomy  2000  . Cesarean section  1994    x 1  . I and d of abcess  05/2011  . Hysteroscopy w/d&c  12/27/2011    Procedure: DILATATION AND CURETTAGE /HYSTEROSCOPY;  Surgeon: Maeola Sarah.  Landry Mellow, MD;  Location: [facility];  Service: Gynecology;;  . Lung biopsy    . Uterine abletion       reports that she has never smoked. She has never used smokeless tobacco. She reports that she drinks alcohol. She reports that she does not use illicit drugs.  No Known Allergies  Family History  Problem Relation Age of Onset  . Diabetes Father   . Diabetes Brother   . Deep vein thrombosis Mother   . Aneurysm  Sister     d/o brain aneurysm     Prior to Admission medications   Medication Sig Start Date End Date Taking? Authorizing Provider  acetaminophen (TYLENOL) 500 MG tablet Take 500-1,000 mg by mouth every 6 (six) hours as needed for moderate pain.   Yes Historical Provider, MD  albuterol (PROVENTIL HFA;VENTOLIN HFA) 108 (90 Base) MCG/ACT inhaler Inhale 1-2 puffs into the lungs every 6 (six) hours as needed for wheezing. 02/08/16  Yes Rigoberto Noel, MD  cetirizine (ZYRTEC) 10 MG tablet Take 10 mg by mouth at bedtime as needed for allergies.   Yes Historical Provider, MD  ferrous sulfate 325 (65 FE) MG tablet Take 1 tablet (325 mg total) by mouth 3 (three) times daily with meals. 08/31/15  Yes [name], NP  folic acid (FOLVITE) 1 MG tablet Take 1 mg by mouth daily. 01/11/16  Yes Historical Provider, MD  furosemide (LASIX) 40 MG tablet Take 1 tablet (40 mg total) by mouth daily. 01/19/15  Yes Orson Eva, MD  HYDROcodone-acetaminophen (NORCO/VICODIN) 5-325 MG per tablet Take 1 tablet by mouth every 6 (six) hours as needed for moderate pain.   Yes Historical Provider, MD  levothyroxine (SYNTHROID, LEVOTHROID) 100 MCG tablet Take 1 tablet (100 mcg total) by mouth daily at 6 (six) AM. 02/24/16  Yes [name], NP  losartan (COZAAR) 50 MG tablet TAKE 1 TABLET (50 MG TOTAL) BY MOUTH DAILY. 08/20/15  Yes [name], NP  methotrexate 50 MG/2ML injection Inject 50 mg into the skin every Friday. 12/13/15  Yes Historical Provider, MD  metoprolol succinate (TOPROL-XL) 25 MG 24 hr tablet TAKE 1 TABLET (25 MG TOTAL) BY MOUTH DAILY. 08/20/15  Yes [name], NP  NON FORMULARY 2 liter of oxygen   Yes Historical Provider, MD  predniSONE (DELTASONE) 10 MG tablet Take 2 tablets (20 mg total) by mouth at bedtime. 07/11/15  Yes Modena Jansky, MD  triamcinolone acetonide (KENALOG) 40 MG/ML injection Inject 40 mg into the muscle every 3 (three) months. For knee pain   Yes Historical Provider, MD  triamcinolone  ointment (KENALOG) 0.1 % Apply 1 application topically 2 (two) times daily. Once or twice weekly as needed for break out of Sarcoidosis   Yes Historical Provider, MD  albuterol (PROVENTIL HFA;VENTOLIN HFA) 108 (90 Base) MCG/ACT inhaler Inhale 1-2 puffs into the lungs every 6 (six) hours as needed for wheezing. Patient not taking: Reported on 02/26/2016 02/08/16   Rigoberto Noel, MD  cholecalciferol (VITAMIN D) 400 UNITS TABS tablet Take 400 Units by mouth daily.    Historical Provider, MD  Coral Calcium 1000 (390 CA) MG TABS Take 1 tablet by mouth daily.    Historical Provider, MD  predniSONE (DELTASONE) 10 MG tablet Take 4 tabs x 4 days, then 3 x 4 days, then 2 x 4 days then 1 tablet daily Patient not taking: Reported on 02/26/2016 02/08/16   Rigoberto Noel, MD    Physical Exam: Filed Vitals:   02/27/16 0200 02/27/16  0330 02/27/16 0400 02/27/16 0430  BP: 141/81 164/75 163/74 172/79  Pulse: 89 90 86 98  Temp:      TempSrc:      Resp: 18 19 15 22   SpO2: 100% 100% 100% 99%      Constitutional: NAD, calm, comfortable. Very obese. Nasal canula in place.  Eyes: PERTLA, lids and conjunctivae normal ENMT: Mucous membranes are moist. Posterior pharynx clear of any exudate or lesions.   Neck: normal, supple, no masses, no thyromegaly Respiratory: clear to auscultation bilaterally, no wheezing, no crackles. Increased work of breathing.   Cardiovascular: S1 & S2 heard, regular rate and rhythm. No carotid bruits.  Abdomen: No distension, tender in LLQ, no rebound or guarding, no masses palpated. Bowel sounds normal.  Musculoskeletal: no clubbing / cyanosis. No joint deformity upper and lower extremities.    Skin: no rashes, lesions, ulcers. No induration Neurologic: CN 2-12 grossly intact. Sensation intact, DTR normal. Strength 5/5 in all 4 limbs.  Psychiatric: Normal judgment and insight. Alert and oriented x 3. Normal mood.     Labs on Admission: I have personally reviewed following labs and  imaging studies  CBC:  Recent Labs Lab 02/26/16 2205  WBC 19.2*  NEUTROABS 16.2*  HGB 9.2*  HCT 34.3*  MCV 68.9*  PLT [id number]   Basic Metabolic Panel:  Recent Labs Lab 02/26/16 2205  NA 140  K 4.4  CL 100*  CO2 30  GLUCOSE 109*  BUN 17  CREATININE 0.89  CALCIUM 9.2   GFR: Estimated Creatinine Clearance: 149.3 mL/min (by C-G formula based on Cr of 0.89). Liver Function Tests: No results for input(s): AST, ALT, ALKPHOS, BILITOT, PROT, ALBUMIN in the last 168 hours. No results for input(s): LIPASE, AMYLASE in the last 168 hours. No results for input(s): AMMONIA in the last 168 hours. Coagulation Profile: No results for input(s): INR, PROTIME in the last 168 hours. Cardiac Enzymes: No results for input(s): CKTOTAL, CKMB, CKMBINDEX, TROPONINI in the last 168 hours. BNP (last 3 results) No results for input(s): PROBNP in the last 8760 hours. HbA1C: No results for input(s): HGBA1C in the last 72 hours. CBG: No results for input(s): GLUCAP in the last 168 hours. Lipid Profile: No results for input(s): CHOL, HDL, LDLCALC, TRIG, CHOLHDL, LDLDIRECT in the last 72 hours. Thyroid Function Tests: No results for input(s): TSH, T4TOTAL, FREET4, T3FREE, THYROIDAB in the last 72 hours. Anemia Panel: No results for input(s): VITAMINB12, FOLATE, FERRITIN, TIBC, IRON, RETICCTPCT in the last 72 hours. Urine analysis:    Component Value Date/Time   COLORURINE YELLOW 02/26/2016 2232   APPEARANCEUR CLEAR 02/26/2016 2232   LABSPEC 1.023 02/26/2016 2232   PHURINE 6.5 02/26/2016 2232   GLUCOSEU NEGATIVE 02/26/2016 2232   HGBUR SMALL* 02/26/2016 2232   BILIRUBINUR NEGATIVE 02/26/2016 2232   KETONESUR NEGATIVE 02/26/2016 2232   PROTEINUR NEGATIVE 02/26/2016 2232   UROBILINOGEN 0.2 09/10/2012 0106   NITRITE NEGATIVE 02/26/2016 2232   LEUKOCYTESUR NEGATIVE 02/26/2016 2232   Sepsis Labs: @LABRCNTIP (procalcitonin:4,lacticidven:4) )No results found for this or any previous visit (from  the past 240 hour(s)).   Radiological Exams on Admission: Dg Chest 2 View  02/26/2016  CLINICAL DATA:  Subacute onset of shortness of breath and generalized chest tightness. Left-sided chest pain. Initial encounter. EXAM: CHEST  2 VIEW COMPARISON:  Chest radiograph performed 07/08/2015 FINDINGS: The lungs are well-aerated. Vascular congestion is noted. Mildly increased interstitial markings may reflect minimal interstitial edema. Mild bilateral atelectasis is seen. There is no evidence of pleural effusion  or pneumothorax. The heart is borderline enlarged. No acute osseous abnormalities are seen. Clips are noted within the right upper quadrant, reflecting prior cholecystectomy. IMPRESSION: Vascular congestion and borderline cardiomegaly. Mildly increased interstitial markings may reflect minimal interstitial edema. Mild bilateral atelectasis seen. Electronically Signed   By: Garald Balding M.D.   On: 02/26/2016 22:14    EKG: Independently reviewed. Sinus rhythm   Assessment/Plan  1. Abdominal pain  - Localized to LLQ with associated leukocytosis and suspicion for acute diverticulitis  - Unfortunately, CT could not accommodate patient's girth  - Plan to treat empirically with Cipro and Flagyl and monitor improvement with these measures    2. Acute on chronic dyspnea  - Multifactorial, likely includes contributions from CHF, COPD, OHS, possibly sarcoid  - Received 125 mg Solu-Medrol and DuoNeb in ED and enjoyed some improvement with this  - Continue systemic steroid with Solu-Medrol 60 mg q6h, DuoNeb q4h prn  - Uses 2 Lpm around-the-clock at home; titrate FiO2 to maintain sats >92%   - Lasix as below   3. OSA/OHS - CPAP qHS  - Pt referred to bariatric surgery per recent PCP notes but declined; was offered medical therapy for wt loss, but also declined   4. Chronic diastolic CHF - TTE (0000000) with EF 60-65%, mild LVH  - Managed with Lasix 40 mg qD at home  - Given Lasix 40 mg IV x1 in ED  with good diuresis reported  - SLIV, daily wts, I/Os, fluid-restrict diet to 1500 cc/day    5. Sarcoidosis - Managed with methotrexate and prednisone at home  - Continue Solu-Medrol as above, methotrexate   6. Microcytic anemia  - Hgb 9.2, MCV 68.9 on admission  - Both indices stable relative to prior measurements  - No sign of active blood loss, monitor   7. Hypothyroidsim - Appears stable  - Continue current-dose Synthroid   8. Hypertension - Slightly elevated on admission  - Continue home antihypertensives (losartan, Toprol), and Lasix    DVT prophylaxis: sq Lovenox  Code Status: Full   Family Communication: None available  Disposition Plan: Admit to telemetry   Consults called: None   Admission status: Observation   Vianne Bulls MD [facility] Pager [phone]  If 7PM-7AM, please contact night-coverage [email or web address] Password [email or web address]  02/27/2016, 4:53 AM

## 2016-02-27 NOTE — ED Notes (Signed)
Pt ambulated from wheelchair to bathroom without difficulty. Pt does become short of breath with any activity.

## 2016-02-27 NOTE — Progress Notes (Signed)
PHARMACIST - PHYSICIAN ORDER COMMUNICATION  CONCERNING: P&T Medication Policy on Herbal Medications  DESCRIPTION:  This patient's order for:  coral calcium  has been noted.  This product(s) is classified as an "herbal" or natural product. Due to a lack of definitive safety studies or FDA approval, nonstandard manufacturing practices, plus the potential risk of unknown drug-drug interactions while on inpatient medications, the Pharmacy and Therapeutics Committee does not permit the use of "herbal" or natural products of this type within [facility].   ACTION TAKEN: The pharmacy department is unable to verify this order at this time and your patient has been informed of this safety policy. Please reevaluate patient's clinical condition at discharge and address if the herbal or natural product(s) should be resumed at that time.  If calcium needed inpatient, please order calcium carbonate or calcium citrate.   [place], Pharm.D., BCPS Clinical Pharmacist Pager: [phone] 02/27/2016 12:24 PM

## 2016-02-27 NOTE — ED Provider Notes (Signed)
Medical screening exam:  Patient arrived at [place] as a transfer to obtain CT scan. I did evaluate her. She is comfortable currently but does have some continued left lower quadrant tenderness without guarding or rebound on examination. Oxygen saturation is 100% on increased nasal cannula oxygen. She is tachyneic and dyspneic. I don't hear specific bronchospasm, but patient does have a history of COPD. She also has hypoventilation syndrome and congestive heart failure, likely secondary to her body habitus. Will provide diuresis, Solu-Medrol, bronchodilator therapy.  Patient has been evaluated by CT tech. It is felt that the patient's girth is too large for the CAT scan. This was experiencing left lower quadrant tenderness, diverticulitis is considered. She does have a history of kidney stones, but does not have significant hematuria. There is also no clear sign of infection.  As CAT scan is not available and patient is experiencing increased work of breathing, as well as for patient to be observed in the hospital. Will initiate empiric treatment for diverticulitis as well as treatment for her chronic lung condition.    Orpah Greek, MD 02/27/16 0400

## 2016-02-27 NOTE — Progress Notes (Signed)
New Admission Note:   Arrival Method: stretcher Mental Orientation: a/o x4 Telemetry: placed Assessment: Completed Skin: clean dry intact IV: RFA SL Pain: none Tubes: none Safety Measures: Safety Fall Prevention Plan has been given, discussed and signed Admission: Completed Unit Orientation: Patient has been orientated to the room, unit and staff.  Family: none present  Orders have been reviewed and implemented. Will continue to monitor the patient. Call light has been placed within reach and bed alarm has been activated.   Retta Mac BSN, RN

## 2016-02-27 NOTE — ED Notes (Signed)
Report given Jerilee Hoh, RN on [address].

## 2016-02-27 NOTE — Progress Notes (Signed)
[facility] Brief Accept note - -   I have reviewed Dr. Criss Rosales H&P.  I have seen and examined the patient.   She reports that her symptoms are much improved.  She is still on IV antibiotics.  She would like to try liquid diet to see if she is able to tolerate that.  Due to body habitus, definite imaging was not able to be accomplished, so she is being treated empirically for diverticulosis.  Her symptoms are consistent with diarrhea for 3 days which has now resolved and intermittent progressing to constant LLQ abdominal pain.  She has an elevated WBC to 19.  She further has a history of kidney stones, and has small Hgb on UA.  Given that her pain has ceased today, this could be related to passage of a stone or adequate pain control.   DDx: kidney stone or diverticulitis.   Plan  Monitor for change in hemodynamic status Continue antibiotics, if tolerates diet, change to PO Hold methotrexate given possible acute infection Breathing is much improved compared to admission, O2 in place Continue solumedrol, duonebs  If she tolerates diet and continues to do well, consider discharge tomorrow.   Gilles Chiquito, MD

## 2016-02-27 NOTE — Progress Notes (Signed)
Pt was transferred to [facility] from [facility] for a CT scan of the abd/pel.  The tech from [facility] called [place] prior to sending the pt to see what out table limit was which the pt weight was fine for that.  BUT we also informed the tech from [facility] that the circumference of our gantry was 76" and that the pt needed to be measured around to make sure they would fit into the scanner.  The pt arrived at [facility] and had not been measured and her circumference was 86".  I gently spoke with the pt about the reason we were measuring her and explained that we wanted her to get the best possible care and to not hurt her.  I spoke with Dr Betsey Holiday who has taken over her care and he decided to cancel the scan.

## 2016-02-27 NOTE — ED Notes (Signed)
[facility] here to transport pt to [facility]. 

## 2016-02-27 NOTE — Progress Notes (Signed)
Patient refused CPAP.  Patient is aware to as RN to call Respiratory if she changes her mind.

## 2016-02-28 DIAGNOSIS — D869 Sarcoidosis, unspecified: Secondary | ICD-10-CM

## 2016-02-28 DIAGNOSIS — I10 Essential (primary) hypertension: Secondary | ICD-10-CM

## 2016-02-28 DIAGNOSIS — I5032 Chronic diastolic (congestive) heart failure: Secondary | ICD-10-CM

## 2016-02-28 DIAGNOSIS — E662 Morbid (severe) obesity with alveolar hypoventilation: Secondary | ICD-10-CM

## 2016-02-28 LAB — URINE CULTURE: Culture: >=100000 — AB

## 2016-02-28 LAB — GLUCOSE, CAPILLARY: Glucose-Capillary: 155 mg/dL — ABNORMAL HIGH (ref 65–99)

## 2016-02-28 MED ORDER — METRONIDAZOLE 500 MG PO TABS
500.0000 mg | ORAL_TABLET | Freq: Three times a day (TID) | ORAL | Status: DC
  Administered 2016-02-28: 500 mg via ORAL
  Filled 2016-02-28: qty 1

## 2016-02-28 MED ORDER — CIPROFLOXACIN HCL 500 MG PO TABS: 500.0000 mg | ORAL_TABLET | Freq: Two times a day (BID) | ORAL | Status: DC

## 2016-02-28 MED ORDER — METRONIDAZOLE 500 MG PO TABS: 500.0000 mg | ORAL_TABLET | Freq: Three times a day (TID) | ORAL | Status: DC

## 2016-02-28 MED ORDER — ONDANSETRON HCL 4 MG PO TABS: 4.0000 mg | ORAL_TABLET | Freq: Four times a day (QID) | ORAL | Status: DC | PRN

## 2016-02-28 MED ORDER — CIPROFLOXACIN HCL 500 MG PO TABS
500.0000 mg | ORAL_TABLET | Freq: Two times a day (BID) | ORAL | Status: DC
  Administered 2016-02-28: 500 mg via ORAL
  Filled 2016-02-28: qty 1

## 2016-02-28 MED ORDER — OXYCODONE-ACETAMINOPHEN 10-325 MG PO TABS: 1.0000 | ORAL_TABLET | Freq: Three times a day (TID) | ORAL | Status: DC | PRN

## 2016-02-28 NOTE — Progress Notes (Signed)
PROGRESS NOTE    [name]  [id number] DOB: 31-Dec-1965 DOA: 02/26/2016 PCP: [name], NP   Brief Narrative:  [name] is a 50 y.o. female with medical history significant for BMI of 80, chronic diastolic CHF, OSA, obesity hypoventilation syndrome, hypothyroidism, and sarcoidosis who presented to the [facility] ED this morning for evaluation of acute left lower quadrant abdominal pain and increased dyspnea for the past 2 weeks.patient denies fevers or chills and denies dysuria or hematuria. She describes her abdominal pain as localized to the left lower quadrant, constant, moderate in intensity, burning in character, and with no relieving or exacerbating factors identified. She has not experienced similar pain previously. She is chronically dyspneic and on 2 L/m supplemental oxygen around the clock at home. She has been slightly more short of breath for the past 2 weeks saw her pulmonologist, and was advised that she may need to increase her Lasix dose. Patient endorses orthopnea and chronic bilateral lower extremity edema, but reports that these are largely unchanged. She denies chest pain or palpitations and denies cough.  Assessment & Plan: 1. Abdominal pain/Diverticulitis - Pain improved very quickly with percocet and antibiotics - Due to body habitus, could not have CT confirmation - Out of abundance of caution and elevated WBC (she is on chronic steroids though), will treat with PO cipro/flagyl for 7 days - Transition to oral Abx today.  - Of note, UC was done and showed lactobacillus species, however, UA was normal with many squams and no WBC.  I do not think this is a true infection.   2. Acute on chronic dyspnea  - Multifactorial, likely includes contributions from CHF, COPD, OHS, possibly sarcoid  - Received 125 mg Solu-Medrol and DuoNeb in ED and improved somewhat - When I saw her, she had no breathing difficulty and no wheezing.  Improved simply  with IV steroid.  - Transition back to home dose of steroids, home O2 and home nebulizers.  Patient reports breathing was almost immediately improved in the ED and feels safe going home.    3. OSA/OHS - CPAP qHS, refused while in house - Has declined bariatric surgery and medication for weight loss in the past.   4. Chronic diastolic CHF - TTE (0000000) with EF 60-65%, mild LVH  - Given Lasix 40 mg IV x1 in ED with good diuresis reported  - Daily weights, in and out - Transition to oral lasix.   5. Sarcoidosis - Managed with methotrexate and prednisone at home  - Transition back to home steroids - Methotrexate at home dose (missed in house)  6. Microcytic anemia  - No active bleeding, monitor  7. Hypothyroidsim - Continue home synthroid  8. Hypertension - Normotensive today - Continue home med losartan, Toprol, and Lasix   Diet: Heart healthy   DVT prophylaxis: Lovenox Code Status:Full  Disposition Plan: Discharge today   Consultants:   None  Procedures:   None  Antimicrobials: (specify start and planned stop date. Auto populated tables are space occupying and do not give end dates)  Ciprofloxacin 02/27/16 --> 03/04/16  Flagyl  02/27/16 --> 03/04/16   Subjective: Patient essentially back to normal this morning.  She complained of no breathing problems at all.  Has never had an abnormal exam for me.  Saturating 90s on 3LNC, home dose, can get home O2 to travel.  Is very interested in being discharged today.   Objective: Filed Vitals:   02/27/16 1648 02/27/16 2018 02/28/16 [id number] 02/28/16 [id number]  BP: 133/80 145/83 145/75 127/101  Pulse: 78 79 69 80  Temp: 98.2 F (36.8 C) 98 F (36.7 C) 97.8 F (36.6 C) 98.4 F (36.9 C)  TempSrc: Oral   Oral  Resp: 17 22 22 20   Weight:   469 lb 9.3 oz (213 kg)   SpO2: 99% 97% 98% 91%    Intake/Output Summary (Last 24 hours) at 02/28/16 1550 Last data filed at 02/28/16 1411  Gross per 24 hour  Intake    480 ml    Output    700 ml  Net   -220 ml   Filed Weights   02/28/16 0512  Weight: 469 lb 9.3 oz (213 kg)    Examination:  General exam: Appears calm and comfortable, obese Respiratory system: Clear to auscultation. Respiratory effort normal. Cardiovascular system: S1 & S2 heard, though distant, RR, NR.  Gastrointestinal system: Abdomen is obese, soft and nontender. Normal bowel sounds heard. Central nervous system: Alert and oriented. No focal neurological deficits. Skin: No rashes, noted Psychiatry: Judgement and insight appear normal. Mood & affect appropriate.     Data Reviewed: I have personally reviewed following labs and imaging studies  CBC:  Recent Labs Lab 02/26/16 2205  WBC 19.2*  NEUTROABS 16.2*  HGB 9.2*  HCT 34.3*  MCV 68.9*  PLT [id number]   Basic Metabolic Panel:  Recent Labs Lab 02/26/16 2205  NA 140  K 4.4  CL 100*  CO2 30  GLUCOSE 109*  BUN 17  CREATININE 0.89  CALCIUM 9.2   CBG:  Recent Labs Lab 02/27/16 0815 02/28/16 0831  GLUCAP 140* 155*   Urine analysis:    Component Value Date/Time   COLORURINE YELLOW 02/26/2016 2232   APPEARANCEUR CLEAR 02/26/2016 2232   LABSPEC 1.023 02/26/2016 2232   PHURINE 6.5 02/26/2016 2232   GLUCOSEU NEGATIVE 02/26/2016 2232   HGBUR SMALL* 02/26/2016 2232   BILIRUBINUR NEGATIVE 02/26/2016 2232   KETONESUR NEGATIVE 02/26/2016 2232   PROTEINUR NEGATIVE 02/26/2016 2232   UROBILINOGEN 0.2 09/10/2012 0106   NITRITE NEGATIVE 02/26/2016 2232   LEUKOCYTESUR NEGATIVE 02/26/2016 2232    Recent Results (from the past 240 hour(s))  Urine culture     Status: Abnormal   Collection Time: 02/26/16 10:32 PM  Result Value Ref Range Status   Specimen Description URINE, CLEAN CATCH  Final   Special Requests NONE  Final   Culture (A)  Final    >=100,000 COLONIES/mL GROUP B STREP(S.AGALACTIAE)ISOLATED TESTING AGAINST S. AGALACTIAE NOT ROUTINELY PERFORMED DUE TO PREDICTABILITY OF AMP/PEN/VAN SUSCEPTIBILITY. >=100,000  COLONIES/mL LACTOBACILLUS SPECIES Standardized susceptibility testing for this organism is not available. Performed at [facility]    Report Status 02/28/2016 FINAL  Final      Radiology Studies: Dg Chest 2 View  02/26/2016  CLINICAL DATA:  Subacute onset of shortness of breath and generalized chest tightness. Left-sided chest pain. Initial encounter. EXAM: CHEST  2 VIEW COMPARISON:  Chest radiograph performed 07/08/2015 FINDINGS: The lungs are well-aerated. Vascular congestion is noted. Mildly increased interstitial markings may reflect minimal interstitial edema. Mild bilateral atelectasis is seen. There is no evidence of pleural effusion or pneumothorax. The heart is borderline enlarged. No acute osseous abnormalities are seen. Clips are noted within the right upper quadrant, reflecting prior cholecystectomy. IMPRESSION: Vascular congestion and borderline cardiomegaly. Mildly increased interstitial markings may reflect minimal interstitial edema. Mild bilateral atelectasis seen. Electronically Signed   By: Garald Balding M.D.   On: 02/26/2016 22:14        Scheduled Meds: .  cholecalciferol  400 Units Oral Daily  . ciprofloxacin  500 mg Oral BID  . enoxaparin (LOVENOX) injection  0.5 mg/kg Subcutaneous Q24H  . ferrous sulfate  325 mg Oral TID WC  . folic acid  1 mg Oral Daily  . furosemide  40 mg Intravenous Once  . levothyroxine  100 mcg Oral QAC breakfast  . losartan  50 mg Oral Daily  . methylPREDNISolone (SOLU-MEDROL) injection  60 mg Intravenous Q6H  . metoprolol succinate  25 mg Oral Daily  . metroNIDAZOLE  500 mg Oral Q8H  . sodium chloride flush  3 mL Intravenous Q12H  . sodium chloride flush  3 mL Intravenous Q12H   Continuous Infusions:    LOS: 1 day    Time spent: 35 minutes    Gilles Chiquito, MD [facility] Pager [phone]  If 7PM-7AM, please contact night-coverage [email or web address] Password [email or web address] 02/28/2016, 3:50 PM

## 2016-02-28 NOTE — Discharge Summary (Signed)
Physician Discharge Summary  [name] [id number] DOB: Sep 28, 1966 DOA: 02/26/2016  PCP: [name], NP  Admit date: 02/26/2016 Discharge date: 02/28/2016  Recommendations for Outpatient Follow-up:  1. Pt will need to follow up with PCP in 2-3 weeks post discharge 2. Please obtain BMP to evaluate electrolytes and kidney function 3. Please also check CBC to evaluate Hg and Hct levels 4. Please evaluate breathing status, return of abdominal pain  Discharge Diagnoses:  Principal Problem:   Abdominal pain - presumed to be diverticulitis  Active Problems:   OSA (obstructive sleep apnea)   Obesity hypoventilation syndrome (HCC)   Hypertension   Sarcoidosis (HCC)   Hypothyroidism   Congestive heart failure (HCC)   Anxiety   Microcytic anemia   Chronic diastolic CHF (congestive heart failure) ([place])  Discharge Condition: Stable, on home O2  Diet recommendation: Heart healthy diet discussed in details   History of present illness:  [name] is a 50 y.o. female with medical history significant for BMI of 80, chronic diastolic CHF, OSA, obesity hypoventilation syndrome, hypothyroidism, and sarcoidosis who presented to the [facility] ED on morning of admission for evaluation of acute left lower quadrant abdominal pain and increased dyspnea for the past 2 weeks.   She  denied fevers or chills and denied dysuria or hematuria. She described her abdominal pain as localized to the left lower quadrant, constant, moderate in intensity, burning in character, and with no relieving or exacerbating factors identified. She did not experience similar pain previously. She is chronically dyspneic and on 2 L/m supplemental oxygen around the clock at home. She has been slightly more short of breath for the past 2 weeks saw her pulmonologist, and was advised that she may need to increase her Lasix dose. Patient endorses orthopnea and chronic bilateral lower extremity edema, but reports  that these are largely unchanged. She denied chest pain or palpitations and denies cough.  Hospital Course:  Abdominal pain - treated presumptively as diverticulitis She presented with this pain, improved quickly with percocet.  She was also treated with IV antibiotics of ciprofloxacin and metronidazole.  She was not able to get a CT scan to further evaluate given her weight and girth.  She did not have fevers.  She did have a leukocytosis with a slight neutrophil predominance, therefore she was maintained on treatment for presumptive diverticulitis.  On day of discharge her pain had resolved, she was eating a regular diet and tolerating pills.  She was discharged to complete a total of 7 days of antibiotics.  She should follow up with PCP to ensure continued resolution of symptoms.  She was further given a short supply of percocet in addition to her home vicoden as this worked better for her in the hospital (10 pills) and some Zofran.   OSA and OHS No acute issue while in hospital, she declined to wear CPAP when offered  Hypertension Continued on home meds of losartan, metoprolol and lasix    Sarcoidosis with acute on chronic dyspnea Also presented with acute on chronic dyspnea for 2 weeks.  Improved quickly with solumedrol dose and was at her baseline symptoms on day of discharge.  She has recently been started on methotrexate as a disease modifying agent in order to hopefully get her off of steroids.  She was discharged home on her home dose of steroids. O2 saturation was maintained 99-100% on 2-4L during hospital stay.  She was sent home on home dose of oxygen.  Hypothyroidism She was maintained on her home dose of synthroid    Microcytic anemia Not an issue while in house.  No active bleeding.  H/H were at baseline and were monitored.     Chronic diastolic CHF (congestive heart failure)  There was concern that her dyspnea was related to this diagnosis.  She was given a single dose of IV  lasix and diuresed well and her breathing returned to baseline.   Home medications were continued on discharge.   Procedures/Studies: Dg Chest 2 View  02/26/2016  CLINICAL DATA:  Subacute onset of shortness of breath and generalized chest tightness. Left-sided chest pain. Initial encounter. EXAM: CHEST  2 VIEW COMPARISON:  Chest radiograph performed 07/08/2015 FINDINGS: The lungs are well-aerated. Vascular congestion is noted. Mildly increased interstitial markings may reflect minimal interstitial edema. Mild bilateral atelectasis is seen. There is no evidence of pleural effusion or pneumothorax. The heart is borderline enlarged. No acute osseous abnormalities are seen. Clips are noted within the right upper quadrant, reflecting prior cholecystectomy. IMPRESSION: Vascular congestion and borderline cardiomegaly. Mildly increased interstitial markings may reflect minimal interstitial edema. Mild bilateral atelectasis seen. Electronically Signed   By: Garald Balding M.D.   On: 02/26/2016 22:14     Consultations:  None  Antibiotics:  Ciprofloxacin 02/27/16 --> 03/04/16  Flagyl 02/27/16 --> 03/04/16  Discharge Exam: Filed Vitals:   02/28/16 0512 02/28/16 0834  BP: 145/75 127/101  Pulse: 69 80  Temp: 97.8 F (36.6 C) 98.4 F (36.9 C)  Resp: 22 20   Filed Vitals:   02/27/16 1648 02/27/16 2018 02/28/16 0512 02/28/16 0834  BP: 133/80 145/83 145/75 127/101  Pulse: 78 79 69 80  Temp: 98.2 F (36.8 C) 98 F (36.7 C) 97.8 F (36.6 C) 98.4 F (36.9 C)  TempSrc: Oral   Oral  Resp: 17 22 22 20   Weight:   469 lb 9.3 oz (213 kg)   SpO2: 99% 97% 98% 91%    General exam: Appears calm and comfortable, obese Respiratory system: Clear to auscultation. Respiratory effort normal. Cardiovascular system: S1 & S2 heard, though distant, RR, NR.  Gastrointestinal system: Abdomen is obese, soft and nontender. Normal bowel sounds heard. Central nervous system: Alert and oriented. No focal neurological  deficits. Skin: No rashes, noted Psychiatry: Judgement and insight appear normal. Mood & affect appropriate.   Discharge Instructions     Medication List    TAKE these medications        acetaminophen 500 MG tablet  Commonly known as:  TYLENOL  Take 500-1,000 mg by mouth every 6 (six) hours as needed for moderate pain.     albuterol 108 (90 Base) MCG/ACT inhaler  Commonly known as:  PROVENTIL HFA;VENTOLIN HFA  Inhale 1-2 puffs into the lungs every 6 (six) hours as needed for wheezing.     cetirizine 10 MG tablet  Commonly known as:  ZYRTEC  Take 10 mg by mouth at bedtime as needed for allergies.     cholecalciferol 400 units Tabs tablet  Commonly known as:  VITAMIN D  Take 400 Units by mouth daily.     ciprofloxacin 500 MG tablet  Commonly known as:  CIPRO  Take 1 tablet (500 mg total) by mouth 2 (two) times daily.     Coral Calcium 1000 (390 Ca) MG Tabs  Take 1 tablet by mouth daily.     ferrous sulfate 325 (65 FE) MG tablet  Take 1 tablet (325 mg total) by mouth 3 (three) times daily with  meals.     folic acid 1 MG tablet  Commonly known as:  FOLVITE  Take 1 mg by mouth daily.     furosemide 40 MG tablet  Commonly known as:  LASIX  Take 1 tablet (40 mg total) by mouth daily.     HYDROcodone-acetaminophen 5-325 MG tablet  Commonly known as:  NORCO/VICODIN  Take 1 tablet by mouth every 6 (six) hours as needed for moderate pain.     KENALOG 40 MG/ML injection  Generic drug:  triamcinolone acetonide  Inject 40 mg into the muscle every 3 (three) months. For knee pain     levothyroxine 100 MCG tablet  Commonly known as:  SYNTHROID, LEVOTHROID  Take 1 tablet (100 mcg total) by mouth daily at 6 (six) AM.     losartan 50 MG tablet  Commonly known as:  COZAAR  TAKE 1 TABLET (50 MG TOTAL) BY MOUTH DAILY.     methotrexate 50 MG/2ML injection  Inject 50 mg into the skin every Friday.     metoprolol succinate 25 MG 24 hr tablet  Commonly known as:  TOPROL-XL   TAKE 1 TABLET (25 MG TOTAL) BY MOUTH DAILY.     metroNIDAZOLE 500 MG tablet  Commonly known as:  FLAGYL  Take 1 tablet (500 mg total) by mouth 3 (three) times daily.     NON FORMULARY  2 liter of oxygen     ondansetron 4 MG tablet  Commonly known as:  ZOFRAN  Take 1 tablet (4 mg total) by mouth every 6 (six) hours as needed for nausea.     predniSONE 10 MG tablet  Commonly known as:  DELTASONE  Take 2 tablets (20 mg total) by mouth at bedtime.     triamcinolone ointment 0.1 %  Commonly known as:  KENALOG  Apply 1 application topically 2 (two) times daily. Once or twice weekly as needed for break out of Sarcoidosis          The results of significant diagnostics from this hospitalization (including imaging, microbiology, ancillary and laboratory) are listed below for reference.     Microbiology: Recent Results (from the past 240 hour(s))  Urine culture     Status: Abnormal   Collection Time: 02/26/16 10:32 PM  Result Value Ref Range Status   Specimen Description URINE, CLEAN CATCH  Final   Special Requests NONE  Final   Culture (A)  Final    >=100,000 COLONIES/mL GROUP B STREP(S.AGALACTIAE)ISOLATED TESTING AGAINST S. AGALACTIAE NOT ROUTINELY PERFORMED DUE TO PREDICTABILITY OF AMP/PEN/VAN SUSCEPTIBILITY. >=100,000 COLONIES/mL LACTOBACILLUS SPECIES Standardized susceptibility testing for this organism is not available. Performed at [facility]    Report Status 02/28/2016 FINAL  Final     Labs: Basic Metabolic Panel:  Recent Labs Lab 02/26/16 2205  NA 140  K 4.4  CL 100*  CO2 30  GLUCOSE 109*  BUN 17  CREATININE 0.89  CALCIUM 9.2   CBC:  Recent Labs Lab 02/26/16 2205  WBC 19.2*  NEUTROABS 16.2*  HGB 9.2*  HCT 34.3*  MCV 68.9*  PLT 313   BNP: BNP (last 3 results)  Recent Labs  02/26/16 2205  BNP 42.9     CBG:  Recent Labs Lab 02/27/16 0815 02/28/16 0831  GLUCAP 140* 155*     SIGNED: Time coordinating discharge: 35  minutes  Gilles Chiquito, MD  [facility] 02/28/2016, 2:26 PM Pager [phone]  If 7PM-7AM, please contact night-coverage [email or web address] Password [email or web address]

## 2016-02-28 NOTE — Discharge Instructions (Addendum)
[name] --    You came in with some abdominal pain.  You have been treated for diverticulitis and have gotten better.  You many have also had a kidney stone.  Regardless, you should continue taking about 5 1/2 days of antibiotics.  These antibiotics are Ciprofloxacin 500mg  twice a day and Metronidazole 500mg  three times a day.  They have been sent to your pharmacy.  You should finish all of the medication.  You will also have a small amount of pain medications.  You should continue to take your other home medications as prescribed, none have been changed since you have been in the hospital.  Please make an appointment with your primary doctor in 7 days to see if your symptoms are better and have resolved.   Follow with Primary MD [name], NP in 7 days   Get CBC, CMP checked  by Primary MD if needed at your next visit.    Activity: As tolerated    Disposition Home   Diet:   Heart Healthy    On your next visit with your primary care physician please Get Medicines reviewed and adjusted.   Please request your Primary MD to go over all Hospital Tests and Procedure/Radiological results at the follow up, please get all Hospital records sent to your Primary MD by signing hospital release before you go home.   If you experience worsening of your admission symptoms, develop shortness of breath, life threatening emergency, suicidal or homicidal thoughts you must seek medical attention immediately by calling 911 or calling your MD immediately  if symptoms less severe.  You Must read complete instructions/literature along with all the possible adverse reactions/side effects for all the Medicines you take and that have been prescribed to you. Take any new Medicines after you have completely understood and accpet all the possible adverse reactions/side effects.   Do not drive when taking Pain medications.    Do not take more than prescribed Pain, Sleep and Anxiety Medications  Special  Instructions: If you have smoked or chewed Tobacco  in the last 2 yrs please stop smoking, stop any regular Alcohol  and or any Recreational drug use.  Wear Seat belts while driving.   Please note  You were cared for by a hospitalist during your hospital stay. If you have any questions about your discharge medications or the care you received while you were in the hospital after you are discharged, you can call the unit and asked to speak with the hospitalist on call if the hospitalist that took care of you is not available. Once you are discharged, your primary care physician will handle any further medical issues. Please note that NO REFILLS for any discharge medications will be authorized once you are discharged, as it is imperative that you return to your primary care physician (or establish a relationship with a primary care physician if you do not have one) for your aftercare needs so that they can reassess your need for medications and monitor your lab values.  Ciprofloxacin tablets What is this medicine? CIPROFLOXACIN (sip roe FLOX a sin) is a quinolone antibiotic. It is used to treat certain kinds of bacterial infections. It will not work for colds, flu, or other viral infections. This medicine may be used for other purposes; ask your health care provider or pharmacist if you have questions. What should I tell my health care provider before I take this medicine? They need to know if you have any of these conditions: -  bone problems -cerebral disease -history of low levels of potassium in the blood -joint problems -irregular heartbeat -kidney disease -myasthenia gravis -seizures -tendon problems -tingling of the fingers or toes, or other nerve disorder -an unusual or allergic reaction to ciprofloxacin, other antibiotics or medicines, foods, dyes, or preservatives -pregnant or trying to get pregnant -breast-feeding How should I use this medicine? Take this medicine by mouth with a  glass of water. Follow the directions on the prescription label. Take your medicine at regular intervals. Do not take your medicine more often than directed. Take all of your medicine as directed even if you think your are better. Do not skip doses or stop your medicine early. You can take this medicine with food or on an empty stomach. It can be taken with a meal that contains dairy or calcium, but do not take it alone with a dairy product, like milk or yogurt or calcium-fortified juice. A special MedGuide will be given to you by the pharmacist with each prescription and refill. Be sure to read this information carefully each time. Talk to your pediatrician regarding the use of this medicine in children. Special care may be needed. Overdosage: If you think you have taken too much of this medicine contact a poison control center or emergency room at once. NOTE: This medicine is only for you. Do not share this medicine with others. What if I miss a dose? If you miss a dose, take it as soon as you can. If it is almost time for your next dose, take only that dose. Do not take double or extra doses. What may interact with this medicine? Do not take this medicine with any of the following medications: -cisapride -droperidol -terfenadine -tizanidine This medicine may also interact with the following medications: -antacids -birth control pills -caffeine -cyclosporin -didanosine (ddI) buffered tablets or powder -medicines for diabetes -medicines for inflammation like ibuprofen, naproxen -methotrexate -multivitamins -omeprazole -phenytoin -probenecid -sucralfate -theophylline -warfarin This list may not describe all possible interactions. Give your health care provider a list of all the medicines, herbs, non-prescription drugs, or dietary supplements you use. Also tell them if you smoke, drink alcohol, or use illegal drugs. Some items may interact with your medicine. What should I watch for  while using this medicine? Tell your doctor or health care professional if your symptoms do not improve. Do not treat diarrhea with over the counter products. Contact your doctor if you have diarrhea that lasts more than 2 days or if it is severe and watery. You may get drowsy or dizzy. Do not drive, use machinery, or do anything that needs mental alertness until you know how this medicine affects you. Do not stand or sit up quickly, especially if you are an older patient. This reduces the risk of dizzy or fainting spells. This medicine can make you more sensitive to the sun. Keep out of the sun. If you cannot avoid being in the sun, wear protective clothing and use sunscreen. Do not use sun lamps or tanning beds/booths. Avoid antacids, aluminum, calcium, iron, magnesium, and zinc products for 6 hours before and 2 hours after taking a dose of this medicine. What side effects may I notice from receiving this medicine? Side effects that you should report to your doctor or health care professional as soon as possible: -allergic reactions like skin rash or hives, swelling of the face, lips, or tongue -anxious -confusion -depressed mood -diarrhea -fast, irregular heartbeat -hallucination, loss of contact with reality -joint, muscle, or tendon  pain or swelling -pain, tingling, numbness in the hands or feet -suicidal thoughts or other mood changes -sunburn -unusually weak or tired Side effects that usually do not require medical attention (report to your doctor or health care professional if they continue or are bothersome): -dry mouth -headache -nausea -trouble sleeping This list may not describe all possible side effects. Call your doctor for medical advice about side effects. You may report side effects to FDA at 1-800-FDA-1088. Where should I keep my medicine? Keep out of the reach of children. Store at room temperature below 30 degrees C (86 degrees F). Keep container tightly closed. Throw  away any unused medicine after the expiration date. NOTE: This sheet is a summary. It may not cover all possible information. If you have questions about this medicine, talk to your doctor, pharmacist, or health care provider.    2016, [employer]vier/[employer] Standard. (2015-05-28 12:57:02)  Metronidazole tablets or capsules What is this medicine? METRONIDAZOLE (me troe NI da zole) is an antiinfective. It is used to treat certain kinds of bacterial and protozoal infections. It will not work for colds, flu, or other viral infections. This medicine may be used for other purposes; ask your health care provider or pharmacist if you have questions. What should I tell my health care provider before I take this medicine? They need to know if you have any of these conditions: -anemia or other blood disorders -disease of the nervous system -fungal or yeast infection -if you drink alcohol containing drinks -liver disease -seizures -an unusual or allergic reaction to metronidazole, or other medicines, foods, dyes, or preservatives -pregnant or trying to get pregnant -breast-feeding How should I use this medicine? Take this medicine by mouth with a full glass of water. Follow the directions on the prescription label. Take your medicine at regular intervals. Do not take your medicine more often than directed. Take all of your medicine as directed even if you think you are better. Do not skip doses or stop your medicine early. Talk to your pediatrician regarding the use of this medicine in children. Special care may be needed. Overdosage: If you think you have taken too much of this medicine contact a poison control center or emergency room at once. NOTE: This medicine is only for you. Do not share this medicine with others. What if I miss a dose? If you miss a dose, take it as soon as you can. If it is almost time for your next dose, take only that dose. Do not take double or extra doses. What may interact with  this medicine? Do not take this medicine with any of the following medications: -alcohol or any product that contains alcohol -amprenavir oral solution -cisapride -disulfiram -dofetilide -dronedarone -paclitaxel injection -pimozide -ritonavir oral solution -sertraline oral solution -sulfamethoxazole-trimethoprim injection -thioridazine -ziprasidone This medicine may also interact with the following medications: -birth control pills -cimetidine -lithium -other medicines that prolong the QT interval (cause an abnormal heart rhythm) -phenobarbital -phenytoin -warfarin This list may not describe all possible interactions. Give your health care provider a list of all the medicines, herbs, non-prescription drugs, or dietary supplements you use. Also tell them if you smoke, drink alcohol, or use illegal drugs. Some items may interact with your medicine. What should I watch for while using this medicine? Tell your doctor or health care professional if your symptoms do not improve or if they get worse. You may get drowsy or dizzy. Do not drive, use machinery, or do anything that needs mental alertness until  you know how this medicine affects you. Do not stand or sit up quickly, especially if you are an older patient. This reduces the risk of dizzy or fainting spells. Avoid alcoholic drinks while you are taking this medicine and for three days afterward. Alcohol may make you feel dizzy, sick, or flushed. If you are being treated for a sexually transmitted disease, avoid sexual contact until you have finished your treatment. Your sexual partner may also need treatment. What side effects may I notice from receiving this medicine? Side effects that you should report to your doctor or health care professional as soon as possible: -allergic reactions like skin rash or hives, swelling of the face, lips, or tongue -confusion, clumsiness -difficulty speaking -discolored or sore  mouth -dizziness -fever, infection -numbness, tingling, pain or weakness in the hands or feet -trouble passing urine or change in the amount of urine -redness, blistering, peeling or loosening of the skin, including inside the mouth -seizures -unusually weak or tired -vaginal irritation, dryness, or discharge Side effects that usually do not require medical attention (report to your doctor or health care professional if they continue or are bothersome): -diarrhea -headache -irritability -metallic taste -nausea -stomach pain or cramps -trouble sleeping This list may not describe all possible side effects. Call your doctor for medical advice about side effects. You may report side effects to FDA at 1-800-FDA-1088. Where should I keep my medicine? Keep out of the reach of children. Store at room temperature below 25 degrees C (77 degrees F). Protect from light. Keep container tightly closed. Throw away any unused medicine after the expiration date. NOTE: This sheet is a summary. It may not cover all possible information. If you have questions about this medicine, talk to your doctor, pharmacist, or health care provider.    2016, [employer]vier/Gold Standard. (2013-05-24 14:08:39)

## 2016-02-28 NOTE — Progress Notes (Signed)
[name] to be D/C'd Home per MD order.  Discussed prescriptions and follow up appointments with the patient. Prescriptions given to patient, medication list explained in detail. Pt verbalized understanding.    Medication List    TAKE these medications        acetaminophen 500 MG tablet  Commonly known as:  TYLENOL  Take 500-1,000 mg by mouth every 6 (six) hours as needed for moderate pain.     albuterol 108 (90 Base) MCG/ACT inhaler  Commonly known as:  PROVENTIL HFA;VENTOLIN HFA  Inhale 1-2 puffs into the lungs every 6 (six) hours as needed for wheezing.     cetirizine 10 MG tablet  Commonly known as:  ZYRTEC  Take 10 mg by mouth at bedtime as needed for allergies.     cholecalciferol 400 units Tabs tablet  Commonly known as:  VITAMIN D  Take 400 Units by mouth daily.     ciprofloxacin 500 MG tablet  Commonly known as:  CIPRO  Take 1 tablet (500 mg total) by mouth 2 (two) times daily.     Coral Calcium 1000 (390 Ca) MG Tabs  Take 1 tablet by mouth daily.     ferrous sulfate 325 (65 FE) MG tablet  Take 1 tablet (325 mg total) by mouth 3 (three) times daily with meals.     folic acid 1 MG tablet  Commonly known as:  FOLVITE  Take 1 mg by mouth daily.     furosemide 40 MG tablet  Commonly known as:  LASIX  Take 1 tablet (40 mg total) by mouth daily.     HYDROcodone-acetaminophen 5-325 MG tablet  Commonly known as:  NORCO/VICODIN  Take 1 tablet by mouth every 6 (six) hours as needed for moderate pain.     KENALOG 40 MG/ML injection  Generic drug:  triamcinolone acetonide  Inject 40 mg into the muscle every 3 (three) months. For knee pain     levothyroxine 100 MCG tablet  Commonly known as:  SYNTHROID, LEVOTHROID  Take 1 tablet (100 mcg total) by mouth daily at 6 (six) AM.     losartan 50 MG tablet  Commonly known as:  COZAAR  TAKE 1 TABLET (50 MG TOTAL) BY MOUTH DAILY.     methotrexate 50 MG/2ML injection  Inject 50 mg into the skin every Friday.      metoprolol succinate 25 MG 24 hr tablet  Commonly known as:  TOPROL-XL  TAKE 1 TABLET (25 MG TOTAL) BY MOUTH DAILY.     metroNIDAZOLE 500 MG tablet  Commonly known as:  FLAGYL  Take 1 tablet (500 mg total) by mouth 3 (three) times daily.     NON FORMULARY  2 liter of oxygen     ondansetron 4 MG tablet  Commonly known as:  ZOFRAN  Take 1 tablet (4 mg total) by mouth every 6 (six) hours as needed for nausea.     predniSONE 10 MG tablet  Commonly known as:  DELTASONE  Take 2 tablets (20 mg total) by mouth at bedtime.     triamcinolone ointment 0.1 %  Commonly known as:  KENALOG  Apply 1 application topically 2 (two) times daily. Once or twice weekly as needed for break out of Sarcoidosis        Filed Vitals:   02/28/16 0512 02/28/16 0834  BP: 145/75 127/101  Pulse: 69 80  Temp: 97.8 F (36.6 C) 98.4 F (36.9 C)  Resp: 22 20    Skin clean, dry  and intact without evidence of skin break down, no evidence of skin tears noted. IV catheter discontinued intact. Site without signs and symptoms of complications. Dressing and pressure applied. Pt denies pain at this time. No complaints noted.  An After Visit Summary was printed and given to the patient. Patient escorted via [place], and D/C home via private auto.  Carole Civil RN, [facility] [address] Phone [phone]

## 2016-02-29 LAB — GLUCOSE, CAPILLARY: Glucose-Capillary: 155 mg/dL — ABNORMAL HIGH (ref 65–99)

## 2016-03-09 DIAGNOSIS — M064 Inflammatory polyarthropathy: Secondary | ICD-10-CM | POA: Diagnosis not present

## 2016-05-19 DIAGNOSIS — Z79899 Other long term (current) drug therapy: Secondary | ICD-10-CM | POA: Diagnosis not present

## 2016-05-19 DIAGNOSIS — M064 Inflammatory polyarthropathy: Secondary | ICD-10-CM | POA: Diagnosis not present

## 2016-05-19 DIAGNOSIS — M1711 Unilateral primary osteoarthritis, right knee: Secondary | ICD-10-CM | POA: Diagnosis not present

## 2016-05-19 DIAGNOSIS — R0602 Shortness of breath: Secondary | ICD-10-CM | POA: Diagnosis not present

## 2016-05-19 DIAGNOSIS — D860 Sarcoidosis of lung: Secondary | ICD-10-CM | POA: Diagnosis not present

## 2016-05-19 DIAGNOSIS — M1712 Unilateral primary osteoarthritis, left knee: Secondary | ICD-10-CM | POA: Diagnosis not present

## 2016-05-19 DIAGNOSIS — E662 Morbid (severe) obesity with alveolar hypoventilation: Secondary | ICD-10-CM | POA: Diagnosis not present

## 2016-05-19 DIAGNOSIS — M159 Polyosteoarthritis, unspecified: Secondary | ICD-10-CM | POA: Diagnosis not present

## 2016-05-19 DIAGNOSIS — R768 Other specified abnormal immunological findings in serum: Secondary | ICD-10-CM | POA: Diagnosis not present

## 2016-06-09 ENCOUNTER — Encounter: Payer: Self-pay | Admitting: Pulmonary Disease

## 2016-06-10 ENCOUNTER — Ambulatory Visit: Payer: Medicare Other | Admitting: Adult Health

## 2016-06-14 ENCOUNTER — Ambulatory Visit: Payer: Medicare Other | Admitting: Adult Health

## 2016-06-15 ENCOUNTER — Inpatient Hospital Stay (HOSPITAL_COMMUNITY)
Admission: EM | Admit: 2016-06-15 | Discharge: 2016-06-19 | DRG: 811 | Disposition: A | Discharge status: Home / Self Care | Payer: Medicare Other | Attending: Internal Medicine | Admitting: Internal Medicine

## 2016-06-15 ENCOUNTER — Encounter (HOSPITAL_COMMUNITY): Payer: Self-pay | Admitting: Emergency Medicine

## 2016-06-15 ENCOUNTER — Emergency Department (HOSPITAL_COMMUNITY): Payer: Medicare Other

## 2016-06-15 DIAGNOSIS — I10 Essential (primary) hypertension: Secondary | ICD-10-CM | POA: Diagnosis present

## 2016-06-15 DIAGNOSIS — I5033 Acute on chronic diastolic (congestive) heart failure: Secondary | ICD-10-CM | POA: Diagnosis present

## 2016-06-15 DIAGNOSIS — R0600 Dyspnea, unspecified: Secondary | ICD-10-CM | POA: Diagnosis not present

## 2016-06-15 DIAGNOSIS — I110 Hypertensive heart disease with heart failure: Secondary | ICD-10-CM | POA: Diagnosis not present

## 2016-06-15 DIAGNOSIS — D649 Anemia, unspecified: Secondary | ICD-10-CM | POA: Diagnosis present

## 2016-06-15 DIAGNOSIS — D6489 Other specified anemias: Secondary | ICD-10-CM | POA: Diagnosis not present

## 2016-06-15 DIAGNOSIS — E039 Hypothyroidism, unspecified: Secondary | ICD-10-CM | POA: Diagnosis present

## 2016-06-15 DIAGNOSIS — Z6841 Body Mass Index (BMI) 40.0 and over, adult: Secondary | ICD-10-CM

## 2016-06-15 DIAGNOSIS — D869 Sarcoidosis, unspecified: Secondary | ICD-10-CM | POA: Diagnosis present

## 2016-06-15 DIAGNOSIS — Z9981 Dependence on supplemental oxygen: Secondary | ICD-10-CM

## 2016-06-15 DIAGNOSIS — D500 Iron deficiency anemia secondary to blood loss (chronic): Principal | ICD-10-CM | POA: Diagnosis present

## 2016-06-15 DIAGNOSIS — Z7952 Long term (current) use of systemic steroids: Secondary | ICD-10-CM

## 2016-06-15 DIAGNOSIS — J449 Chronic obstructive pulmonary disease, unspecified: Secondary | ICD-10-CM | POA: Diagnosis not present

## 2016-06-15 DIAGNOSIS — G4733 Obstructive sleep apnea (adult) (pediatric): Secondary | ICD-10-CM | POA: Diagnosis present

## 2016-06-15 DIAGNOSIS — Z91048 Other nonmedicinal substance allergy status: Secondary | ICD-10-CM

## 2016-06-15 DIAGNOSIS — Z79899 Other long term (current) drug therapy: Secondary | ICD-10-CM

## 2016-06-15 DIAGNOSIS — R0602 Shortness of breath: Secondary | ICD-10-CM | POA: Diagnosis not present

## 2016-06-15 HISTORY — DX: Heart failure, unspecified: I50.9

## 2016-06-15 NOTE — ED Triage Notes (Signed)
Pt states she has been becoming more short of breath over the past 4 days  Pt states she gets very short of breath just walking 5 feet and it takes her longer to get her breath when she rests  Pt states with the shortness of breath she has been getting some tightness in her chest and back  Pt states last time she was here her hgb was low and she had similar sxs then

## 2016-06-15 NOTE — ED Provider Notes (Addendum)
[facility] DEPT Provider Note   CSN: [id number] Arrival date & time: 06/15/16  1919  By signing my name below, I, Jasmyn B. Alexander, attest that this documentation has been prepared under the direction and in the presence of Shanon Rosser, MD. Electronically Signed: Tedra Coupe. Sheppard Coil, ED Scribe. 06/15/16. 11:13 PM.  History   Chief Complaint Chief Complaint  Patient presents with  . Shortness of Breath    HPI HPI Comments: STEFANY MENDIOLA is a 50 y.o. female with PMhx of sarcoidosis, COPD, morbid obesity, GERD and HTN who presents to the Emergency Department complaining of gradually worsening, shortness of breath x 4 days. Symptoms are moderate to severe and worse with ambulation. The dyspnea is accompanied by chest tightness. Her symptoms are similar to those when she has been symptomatically anemic in the past. She is taking prednisone for sarcoidosis. Pulmonologist is Dr. Elsworth Soho. She notes that she has heavy menstrual cycles, with her last one ending 2 days ago. Denies any nausea, vomiting, diarrhea, diaphoresis or chills.   She is on home oxygen at 2 liters by nasal cannula for her sarcoidosis.  The history is provided by the patient. No language interpreter was used.   Past Medical History:  Diagnosis Date  . Anemia   . Arthritis    hands, shoulders, no meds  . Chronic hyperventilation syndrome    w/ obesity tx with albuterol inhaler and oxygen 2L  . COPD (chronic obstructive pulmonary disease) ([place])    uses oxygen 2 L  . Gallstones   . GERD (gastroesophageal reflux disease)    diet controlled - no meds  . H/O hiatal hernia   . Hypertension   . Hypothyroidism   . Kidney stones   . Morbid obesity ([place])   . Pneumonia    hoispitalized in 08/2011  . Sarcoidosis ([place])   . Seasonal allergies   . Shortness of breath   . Sleep apnea    uses CPAP machine     Patient Active Problem List   Diagnosis Date Noted  . Microcytic anemia 02/27/2016  . Abdominal pain  02/27/2016  . Chronic diastolic CHF (congestive heart failure) ([place]) 02/27/2016  . Hyperlipidemia 02/24/2016  . Chronic respiratory failure ([place]) 09/21/2015  . Absolute anemia   . Anxiety 04/14/2015  . Hypoxemia   . Congestive heart failure ([place])   . Symptomatic anemia 01/15/2015  . Hypothyroidism 01/15/2015  . Morbid obesity ([place]) 01/15/2015  . Fibroids 12/09/2013  . Hyperglycemia 09/16/2013  . Left knee pain 09/09/2012  . Sarcoidosis ([place]) 09/09/2012  . Iron deficiency anemia due to chronic blood loss 09/09/2012  . Hypertension 02/25/2011  . Obesity hypoventilation syndrome ([place]) 02/14/2011  . OSA (obstructive sleep apnea) 02/10/2011    Past Surgical History:  Procedure Laterality Date  . [place]   x 1  . CHOLECYSTECTOMY  2000  . HYSTEROSCOPY W/D&C  12/27/2011   Procedure: DILATATION AND CURETTAGE /HYSTEROSCOPY;  Surgeon: Maeola Sarah. Landry Mellow, MD;  Location: [facility];  Service: Gynecology;;  . I and D of abcess  05/2011  . LUNG BIOPSY    . uterine abletion      OB History    No data available       Home Medications    Prior to Admission medications   Medication Sig Start Date End Date Taking? Authorizing Provider  acetaminophen (TYLENOL) 500 MG tablet Take 500-1,000 mg by mouth every 6 (six) hours as needed for moderate pain.   Yes Historical Provider, MD  albuterol (PROVENTIL HFA;VENTOLIN HFA) 108 (90 Base) MCG/ACT inhaler Inhale 1-2 puffs into the lungs every 6 (six) hours as needed for wheezing. 02/08/16  Yes Rigoberto Noel, MD  cetirizine (ZYRTEC) 10 MG tablet Take 10 mg by mouth at bedtime as needed for allergies.   Yes Historical Provider, MD  cholecalciferol (VITAMIN D) 400 UNITS TABS tablet Take 400 Units by mouth daily.   Yes Historical Provider, MD  Coral Calcium 1000 (390 CA) MG TABS Take 1 tablet by mouth daily.   Yes Historical Provider, MD  ferrous sulfate 325 (65 FE) MG tablet Take 1 tablet (325 mg total) by mouth 3 (three) times daily with meals.  08/31/15  Yes [name], NP  folic acid (FOLVITE) 1 MG tablet Take 1 mg by mouth daily. 01/11/16  Yes Historical Provider, MD  furosemide (LASIX) 40 MG tablet Take 1 tablet (40 mg total) by mouth daily. 01/19/15  Yes Orson Eva, MD  HYDROcodone-acetaminophen (NORCO/VICODIN) 5-325 MG per tablet Take 1 tablet by mouth every 6 (six) hours as needed for moderate pain.   Yes Historical Provider, MD  levothyroxine (SYNTHROID, LEVOTHROID) 100 MCG tablet Take 1 tablet (100 mcg total) by mouth daily at 6 (six) AM. 02/24/16  Yes [name], NP  losartan (COZAAR) 50 MG tablet TAKE 1 TABLET (50 MG TOTAL) BY MOUTH DAILY. 08/20/15  Yes [name], NP  methotrexate 50 MG/2ML injection Inject 50 mg into the skin every Friday. 12/13/15  Yes Historical Provider, MD  metoprolol succinate (TOPROL-XL) 25 MG 24 hr tablet TAKE 1 TABLET (25 MG TOTAL) BY MOUTH DAILY. 08/20/15  Yes [name], NP  NON FORMULARY 2 liter of oxygen   Yes Historical Provider, MD  predniSONE (DELTASONE) 10 MG tablet Take 2 tablets (20 mg total) by mouth at bedtime. 07/11/15  Yes Modena Jansky, MD  triamcinolone acetonide (KENALOG) 40 MG/ML injection Inject 40 mg into the muscle every 3 (three) months. For knee pain   Yes Historical Provider, MD  triamcinolone ointment (KENALOG) 0.1 % Apply 1 application topically 2 (two) times daily. Once or twice weekly as needed for break out of Sarcoidosis   Yes Historical Provider, MD    Family History Family History  Problem Relation Age of Onset  . Diabetes Father   . Diabetes Brother   . Deep vein thrombosis Mother   . Aneurysm Sister     d/o brain aneurysm    Social History Social History  Substance Use Topics  . Smoking status: Never Smoker  . Smokeless tobacco: Never Used  . Alcohol use Yes     Comment: occasionally     Allergies   Review of patient's allergies indicates no known allergies.   Review of Systems Review of Systems 10 Systems reviewed and all are negative  for acute change except as noted in the HPI.  Physical Exam Updated Vital Signs BP 160/62   Pulse 93   Temp 100.2 F (37.9 C)   Resp (!) 34   LMP 06/09/2016   SpO2 99%   Physical Exam General: Well-developed, morbidly obese female in no acute distress; appearance consistent with age of record HENT: normocephalic; atraumatic Eyes: pupils equal, round and reactive to light; extraocular muscles intact; pale conjunctivae Neck: supple Heart: regular rate and rhythm Lungs: clear to auscultation bilaterally Abdomen: soft; obese; nontender; bowel sounds present Extremities: No deformity; full range of motion; pulses normal; chronic appearing edema of the lower legs Neurologic: Awake, alert and oriented; motor function intact in all extremities  and symmetric; no facial droop Skin: Warm and dry Psychiatric: Normal mood and affect  ED Treatments / Results   Nursing notes and vitals signs, including pulse oximetry, reviewed.  Summary of this visit's results, reviewed by myself:  Labs:  Results for orders placed or performed during the hospital encounter of 06/15/16 (from the past 24 hour(s))  CBC with Differential/Platelet     Status: Abnormal   Collection Time: 06/16/16 12:00 AM  Result Value Ref Range   WBC 11.8 (H) 4.0 - 10.5 K/uL   RBC 4.31 3.87 - 5.11 MIL/uL   Hemoglobin 7.6 (L) 12.0 - 15.0 g/dL   HCT 29.1 (L) 36.0 - 46.0 %   MCV 67.5 (L) 78.0 - 100.0 fL   MCH 17.6 (L) 26.0 - 34.0 pg   MCHC 26.1 (L) 30.0 - 36.0 g/dL   RDW 26.4 (H) 11.5 - 15.5 %   Platelets 242 150 - 400 K/uL   Neutrophils Relative % 87 %   Lymphocytes Relative 6 %   Monocytes Relative 7 %   Eosinophils Relative 0 %   Basophils Relative 0 %   Neutro Abs 10.3 (H) 1.7 - 7.7 K/uL   Lymphs Abs 0.7 0.7 - 4.0 K/uL   Monocytes Absolute 0.8 0.1 - 1.0 K/uL   Eosinophils Absolute 0.0 0.0 - 0.7 K/uL   Basophils Absolute 0.0 0.0 - 0.1 K/uL   RBC Morphology POLYCHROMASIA PRESENT   Brain natriuretic peptide      Status: None   Collection Time: 06/16/16 12:00 AM  Result Value Ref Range   B Natriuretic Peptide 62.0 0.0 - 100.0 pg/mL  I-stat troponin, ED     Status: None   Collection Time: 06/16/16 12:09 AM  Result Value Ref Range   Troponin i, poc 0.00 0.00 - 0.08 ng/mL   Comment 3          I-stat chem 8, ed     Status: Abnormal   Collection Time: 06/16/16 12:11 AM  Result Value Ref Range   Sodium 141 135 - 145 mmol/L   Potassium 4.1 3.5 - 5.1 mmol/L   Chloride 100 (L) 101 - 111 mmol/L   BUN 15 6 - 20 mg/dL   Creatinine, Ser 0.70 0.44 - 1.00 mg/dL   Glucose, Bld 107 (H) 65 - 99 mg/dL   Calcium, Ion 1.02 (L) 1.13 - 1.30 mmol/L   TCO2 33 0 - 100 mmol/L   Hemoglobin 10.2 (L) 12.0 - 15.0 g/dL   HCT 30.0 (L) 36.0 - 46.0 %  Urinalysis, Routine w reflex microscopic (not at [facility])     Status: Abnormal   Collection Time: 06/16/16  1:00 AM  Result Value Ref Range   Color, Urine YELLOW YELLOW   APPearance CLOUDY (A) CLEAR   Specific Gravity, Urine 1.021 1.005 - 1.030   pH 6.0 5.0 - 8.0   Glucose, UA NEGATIVE NEGATIVE mg/dL   Hgb urine dipstick LARGE (A) NEGATIVE   Bilirubin Urine NEGATIVE NEGATIVE   Ketones, ur NEGATIVE NEGATIVE mg/dL   Protein, ur NEGATIVE NEGATIVE mg/dL   Nitrite NEGATIVE NEGATIVE   Leukocytes, UA NEGATIVE NEGATIVE  Pregnancy, urine     Status: None   Collection Time: 06/16/16  1:00 AM  Result Value Ref Range   Preg Test, Ur NEGATIVE NEGATIVE  Urine microscopic-add on     Status: Abnormal   Collection Time: 06/16/16  1:00 AM  Result Value Ref Range   Squamous Epithelial / LPF 0-5 (A) NONE SEEN   WBC, UA  NONE SEEN 0 - 5 WBC/hpf   RBC / HPF TOO NUMEROUS TO COUNT 0 - 5 RBC/hpf   Bacteria, UA RARE (A) NONE SEEN   Crystals URIC ACID CRYSTALS (A) NEGATIVE   Urine-Other MUCOUS PRESENT     Imaging Studies: Dg Chest 2 View  Result Date: 06/15/2016 CLINICAL DATA:  Acute onset of worsening shortness of breath. Initial encounter. EXAM: CHEST  2 VIEW COMPARISON:  Chest  radiograph performed 02/26/2016 FINDINGS: Vascular congestion is noted. Increased interstitial markings raise concern for pulmonary edema. No pleural effusion or pneumothorax is seen. The cardiomediastinal silhouette is borderline enlarged. No acute osseous abnormalities are identified. IMPRESSION: Vascular congestion and borderline cardiomegaly. Increased interstitial markings raise concern for pulmonary edema. Electronically Signed   By: Garald Balding M.D.   On: 06/15/2016 23:43     EKG Interpretation  Date/Time:  Wednesday June 15 2016 19:30:38 EDT Ventricular Rate:  114 PR Interval:    QRS Duration: 86 QT Interval:  337 QTC Calculation: 465 R Axis:   93 Text Interpretation:  Sinus tachycardia Probable left atrial enlargement Borderline right axis deviation SINCE LAST TRACING HEART RATE HAS INCREASED Confirmed by Winfred Leeds  MD, Inocente Salles [phone]) on 06/15/2016 7:41:53 PM      Dr. Maudie Mercury, hospitalist, to admit.  Procedures (including critical care time)   Final Clinical Impressions(s) / ED Diagnoses   Final diagnoses:  Symptomatic anemia  Dyspnea  Anemia due to blood loss, chronic    I personally performed the services described in this documentation, which was scribed in my presence. The recorded information has been reviewed and is accurate.    Shanon Rosser, MD 06/16/16 [id number]    Shanon Rosser, MD 06/16/16 [phone]

## 2016-06-15 NOTE — ED Notes (Signed)
Pt in xray

## 2016-06-15 NOTE — ED Notes (Signed)
Attempted lab draw x 2 but unsuccessful. 

## 2016-06-16 ENCOUNTER — Encounter (HOSPITAL_COMMUNITY): Payer: Self-pay | Admitting: Internal Medicine

## 2016-06-16 DIAGNOSIS — D649 Anemia, unspecified: Secondary | ICD-10-CM

## 2016-06-16 LAB — CBC
HCT: 29.4 % — ABNORMAL LOW (ref 36.0–46.0)
HEMOGLOBIN: 7.6 g/dL — AB (ref 12.0–15.0)
MCH: 17.5 pg — ABNORMAL LOW (ref 26.0–34.0)
MCHC: 25.9 g/dL — AB (ref 30.0–36.0)
MCV: 67.7 fL — ABNORMAL LOW (ref 78.0–100.0)
Platelets: 224 10*3/uL (ref 150–400)
RBC: 4.34 MIL/uL (ref 3.87–5.11)
RDW: 26.3 % — ABNORMAL HIGH (ref 11.5–15.5)
WBC: 8.6 10*3/uL (ref 4.0–10.5)

## 2016-06-16 LAB — I-STAT CHEM 8, ED
BUN: 15 mg/dL (ref 6–20)
CREATININE: 0.7 mg/dL (ref 0.44–1.00)
Calcium, Ion: 1.02 mmol/L — ABNORMAL LOW (ref 1.13–1.30)
Chloride: 100 mmol/L — ABNORMAL LOW (ref 101–111)
GLUCOSE: 107 mg/dL — AB (ref 65–99)
HCT: 30 % — ABNORMAL LOW (ref 36.0–46.0)
HEMOGLOBIN: 10.2 g/dL — AB (ref 12.0–15.0)
POTASSIUM: 4.1 mmol/L (ref 3.5–5.1)
Sodium: 141 mmol/L (ref 135–145)
TCO2: 33 mmol/L (ref 0–100)

## 2016-06-16 LAB — CBC WITH DIFFERENTIAL/PLATELET
BASOS ABS: 0 10*3/uL (ref 0.0–0.1)
BASOS PCT: 0 %
Eosinophils Absolute: 0 10*3/uL (ref 0.0–0.7)
Eosinophils Relative: 0 %
HEMATOCRIT: 29.1 % — AB (ref 36.0–46.0)
HEMOGLOBIN: 7.6 g/dL — AB (ref 12.0–15.0)
LYMPHS ABS: 0.7 10*3/uL (ref 0.7–4.0)
Lymphocytes Relative: 6 %
MCH: 17.6 pg — AB (ref 26.0–34.0)
MCHC: 26.1 g/dL — AB (ref 30.0–36.0)
MCV: 67.5 fL — ABNORMAL LOW (ref 78.0–100.0)
MONOS PCT: 7 %
Monocytes Absolute: 0.8 10*3/uL (ref 0.1–1.0)
NEUTROS ABS: 10.3 10*3/uL — AB (ref 1.7–7.7)
Neutrophils Relative %: 87 %
Platelets: 242 10*3/uL (ref 150–400)
RBC: 4.31 MIL/uL (ref 3.87–5.11)
RDW: 26.4 % — AB (ref 11.5–15.5)
WBC: 11.8 10*3/uL — ABNORMAL HIGH (ref 4.0–10.5)

## 2016-06-16 LAB — URINE MICROSCOPIC-ADD ON: WBC UA: NONE SEEN WBC/hpf (ref 0–5)

## 2016-06-16 LAB — URINALYSIS, ROUTINE W REFLEX MICROSCOPIC
BILIRUBIN URINE: NEGATIVE
GLUCOSE, UA: NEGATIVE mg/dL
KETONES UR: NEGATIVE mg/dL
LEUKOCYTES UA: NEGATIVE
Nitrite: NEGATIVE
PH: 6 (ref 5.0–8.0)
Protein, ur: NEGATIVE mg/dL
Specific Gravity, Urine: 1.021 (ref 1.005–1.030)

## 2016-06-16 LAB — IRON AND TIBC
IRON: 15 ug/dL — AB (ref 28–170)
Saturation Ratios: 4 % — ABNORMAL LOW (ref 10.4–31.8)
TIBC: 377 ug/dL (ref 250–450)
UIBC: 362 ug/dL

## 2016-06-16 LAB — VITAMIN B12: VITAMIN B 12: 224 pg/mL (ref 180–914)

## 2016-06-16 LAB — I-STAT TROPONIN, ED: Troponin i, poc: 0 ng/mL (ref 0.00–0.08)

## 2016-06-16 LAB — PREGNANCY, URINE: Preg Test, Ur: NEGATIVE

## 2016-06-16 LAB — HEMOGLOBIN AND HEMATOCRIT, BLOOD
HCT: 33.6 % — ABNORMAL LOW (ref 36.0–46.0)
HEMOGLOBIN: 8.6 g/dL — AB (ref 12.0–15.0)

## 2016-06-16 LAB — FERRITIN: Ferritin: 10 ng/mL — ABNORMAL LOW (ref 11–307)

## 2016-06-16 LAB — BRAIN NATRIURETIC PEPTIDE: B Natriuretic Peptide: 62 pg/mL (ref 0.0–100.0)

## 2016-06-16 LAB — PREPARE RBC (CROSSMATCH)

## 2016-06-16 MED ORDER — METOPROLOL SUCCINATE ER 25 MG PO TB24
25.0000 mg | ORAL_TABLET | Freq: Every day | ORAL | Status: DC
  Administered 2016-06-16 – 2016-06-19 (×4): 25 mg via ORAL
  Filled 2016-06-16 (×4): qty 1

## 2016-06-16 MED ORDER — ACETAMINOPHEN 325 MG PO TABS: 650.0000 mg | ORAL_TABLET | Freq: Four times a day (QID) | ORAL | Status: DC | PRN

## 2016-06-16 MED ORDER — ALBUTEROL SULFATE (2.5 MG/3ML) 0.083% IN NEBU: 2.5000 mg | INHALATION_SOLUTION | Freq: Four times a day (QID) | RESPIRATORY_TRACT | Status: DC | PRN

## 2016-06-16 MED ORDER — FERROUS SULFATE 325 (65 FE) MG PO TABS
325.0000 mg | ORAL_TABLET | Freq: Three times a day (TID) | ORAL | Status: DC
  Administered 2016-06-16 – 2016-06-19 (×8): 325 mg via ORAL
  Filled 2016-06-16 (×8): qty 1

## 2016-06-16 MED ORDER — CETYLPYRIDINIUM CHLORIDE 0.05 % MT LIQD
7.0000 mL | Freq: Two times a day (BID) | OROMUCOSAL | Status: DC
  Administered 2016-06-16 – 2016-06-18 (×5): 7 mL via OROMUCOSAL

## 2016-06-16 MED ORDER — CHLORHEXIDINE GLUCONATE 0.12 % MT SOLN
15.0000 mL | Freq: Two times a day (BID) | OROMUCOSAL | Status: DC
  Administered 2016-06-16 – 2016-06-19 (×5): 15 mL via OROMUCOSAL
  Filled 2016-06-16 (×7): qty 15

## 2016-06-16 MED ORDER — CHOLECALCIFEROL 10 MCG (400 UNIT) PO TABS
400.0000 [IU] | ORAL_TABLET | Freq: Every day | ORAL | Status: DC
  Administered 2016-06-16 – 2016-06-18 (×3): 400 [IU] via ORAL
  Filled 2016-06-16 (×4): qty 1

## 2016-06-16 MED ORDER — FUROSEMIDE 40 MG PO TABS: 40.0000 mg | ORAL_TABLET | Freq: Every day | ORAL | Status: DC

## 2016-06-16 MED ORDER — LEVOTHYROXINE SODIUM 100 MCG PO TABS
100.0000 ug | ORAL_TABLET | Freq: Every day | ORAL | Status: DC
  Administered 2016-06-17 – 2016-06-19 (×3): 100 ug via ORAL
  Filled 2016-06-16 (×3): qty 1

## 2016-06-16 MED ORDER — METHOTREXATE SODIUM CHEMO INJECTION 50 MG/2ML: 50.0000 mg | INTRAMUSCULAR | Status: DC

## 2016-06-16 MED ORDER — FOLIC ACID 1 MG PO TABS
1.0000 mg | ORAL_TABLET | Freq: Every day | ORAL | Status: DC
  Administered 2016-06-16 – 2016-06-19 (×4): 1 mg via ORAL
  Filled 2016-06-16 (×4): qty 1

## 2016-06-16 MED ORDER — FUROSEMIDE 10 MG/ML IJ SOLN: 20.0000 mg | Freq: Once | INTRAMUSCULAR | Status: DC

## 2016-06-16 MED ORDER — LOSARTAN POTASSIUM 50 MG PO TABS
50.0000 mg | ORAL_TABLET | Freq: Every day | ORAL | Status: DC
  Administered 2016-06-16 – 2016-06-19 (×4): 50 mg via ORAL
  Filled 2016-06-16 (×4): qty 1

## 2016-06-16 MED ORDER — HYDROCODONE-ACETAMINOPHEN 5-325 MG PO TABS: 1.0000 | ORAL_TABLET | Freq: Four times a day (QID) | ORAL | Status: DC | PRN

## 2016-06-16 MED ORDER — FUROSEMIDE 10 MG/ML IJ SOLN
40.0000 mg | Freq: Every day | INTRAMUSCULAR | Status: DC
  Administered 2016-06-16 – 2016-06-17 (×2): 40 mg via INTRAVENOUS
  Filled 2016-06-16 (×2): qty 4

## 2016-06-16 MED ORDER — SODIUM CHLORIDE 0.9% FLUSH
3.0000 mL | Freq: Two times a day (BID) | INTRAVENOUS | Status: DC
  Administered 2016-06-16 – 2016-06-18 (×4): 3 mL via INTRAVENOUS

## 2016-06-16 MED ORDER — SODIUM CHLORIDE 0.9% FLUSH
3.0000 mL | Freq: Two times a day (BID) | INTRAVENOUS | Status: DC
  Administered 2016-06-16 – 2016-06-18 (×3): 3 mL via INTRAVENOUS

## 2016-06-16 MED ORDER — ACETAMINOPHEN 650 MG RE SUPP: 650.0000 mg | Freq: Four times a day (QID) | RECTAL | Status: DC | PRN

## 2016-06-16 MED ORDER — PREDNISONE 20 MG PO TABS
20.0000 mg | ORAL_TABLET | Freq: Every day | ORAL | Status: DC
  Administered 2016-06-16 – 2016-06-18 (×3): 20 mg via ORAL
  Filled 2016-06-16 (×3): qty 1

## 2016-06-16 MED ORDER — SODIUM CHLORIDE 0.9 % IV SOLN: 250.0000 mL | INTRAVENOUS | Status: DC | PRN

## 2016-06-16 MED ORDER — SODIUM CHLORIDE 0.9% FLUSH: 3.0000 mL | INTRAVENOUS | Status: DC | PRN

## 2016-06-16 MED ORDER — SODIUM CHLORIDE 0.9 % IV SOLN
Freq: Once | INTRAVENOUS | Status: AC
  Administered 2016-06-16: 14:00:00 via INTRAVENOUS

## 2016-06-16 MED ORDER — LORATADINE 10 MG PO TABS
10.0000 mg | ORAL_TABLET | Freq: Every day | ORAL | Status: DC
  Administered 2016-06-16 – 2016-06-19 (×4): 10 mg via ORAL
  Filled 2016-06-16 (×4): qty 1

## 2016-06-16 NOTE — ED Notes (Signed)
Pt can go to floor at 11:08

## 2016-06-16 NOTE — Progress Notes (Signed)
[facility] PLAN OF CARE NOTE Patient: [name] [id number]   PCP: [name], NP DOB: 10-18-1966   DOA: 06/15/2016   DOS: 06/16/2016    Patient was admitted by my colleague Dr.Kim earlier on 06/16/2016. I have reviewed the H&P as well as assessment and plan and agree with the same. Important changes in the plan are listed below.  Plan of care: Principal Problem:   Symptomatic anemia Follow-up CBC after transfusion. Patient appears to be chronically anemic due to menorrhagia. Also has symptoms of CHF. We will consider giving her IV Lasix.  Author: Berle Mull, MD [facility] Pager: [phone] 06/16/2016 7:25 PM   If 7PM-7AM, please contact night-coverage at [email or web address], password [facility]

## 2016-06-16 NOTE — Progress Notes (Signed)
Patient refused assessment of sacral area/buttocks by this RN and Othella Boyer, RN.

## 2016-06-16 NOTE — H&P (Signed)
[facility] H&P   Patient Demographics:    [name], is a 50 y.o. female  MRN: [id number]   DOB - 06/15/1966  Admit Date - 06/15/2016  Outpatient Primary MD for the patient is [name], NP  Referring MD/NP/PA: [place]  Outpatient Specialists:   Patient coming from: home  Chief Complaint  Patient presents with  . Shortness of Breath      HPI:    [name]  is a 50 y.o. female, w/ hx of anemia, currently having her period c/o dyspnea which she relates to worsening anemia.  She therefore presented to ED today and was found to have anemia.  Hgb = 7.6.  Pt denies brbpr, hematuria, hemoptysis  ED requested obs admission for transfusion. Pt will be transfused 1 unit prbc.      Review of systems:    In addition to the HPI above,  No Fever-chills, No Headache, No changes with Vision or hearing, No problems swallowing food or Liquids, No Chest pain, Cough or Shortness of Breath, No Abdominal pain, No Nausea or Vommitting, Bowel movements are regular, No Blood in stool or Urine, No dysuria, No new skin rashes or bruises, No new joints pains-aches,  No new weakness, tingling, numbness in any extremity, No recent weight gain or loss, No polyuria, polydypsia or polyphagia, No significant Mental Stressors.  A full 10 point Review of Systems was done, except as stated above, all other Review of Systems were negative.   With Past History of the following :    Past Medical History:  Diagnosis Date  . Anemia   . Arthritis    hands, shoulders, no meds  . CHF (congestive heart failure) ([facility])    EF60-65%  . Chronic hyperventilation syndrome    w/ obesity tx with albuterol inhaler and oxygen 2L  . COPD (chronic obstructive pulmonary disease) ([place])    uses oxygen 2 L  . Gallstones   . GERD (gastroesophageal reflux disease)    diet controlled - no meds  .  H/O hiatal hernia   . Hypertension   . Hypothyroidism   . Kidney stones   . Morbid obesity ([place])   . Pneumonia    hoispitalized in 08/2011  . Sarcoidosis ([place])   . Seasonal allergies   . Shortness of breath   . Sleep apnea    uses CPAP machine       Past Surgical History:  Procedure Laterality Date  . [place]   x 1  . CHOLECYSTECTOMY  2000  . HYSTEROSCOPY W/D&C  12/27/2011   Procedure: DILATATION AND CURETTAGE /HYSTEROSCOPY;  Surgeon: Maeola Sarah. Landry Mellow, MD;  Location: [facility];  Service: Gynecology;;  . I and D of abcess  05/2011  . LUNG BIOPSY    . uterine abletion        Social History:     Social History  Substance  Use Topics  . Smoking status: Never Smoker  . Smokeless tobacco: Never Used  . Alcohol use Yes     Comment: occasionally     Lives - at home  Mobility - ambulates     Family History :     Family History  Problem Relation Age of Onset  . Diabetes Father   . Diabetes Brother   . Deep vein thrombosis Mother   . Aneurysm Sister     d/o brain aneurysm      Home Medications:   Prior to Admission medications   Medication Sig Start Date End Date Taking? Authorizing Provider  acetaminophen (TYLENOL) 500 MG tablet Take 500-1,000 mg by mouth every 6 (six) hours as needed for moderate pain.   Yes Historical Provider, MD  albuterol (PROVENTIL HFA;VENTOLIN HFA) 108 (90 Base) MCG/ACT inhaler Inhale 1-2 puffs into the lungs every 6 (six) hours as needed for wheezing. 02/08/16  Yes Rigoberto Noel, MD  cetirizine (ZYRTEC) 10 MG tablet Take 10 mg by mouth at bedtime as needed for allergies.   Yes Historical Provider, MD  cholecalciferol (VITAMIN D) 400 UNITS TABS tablet Take 400 Units by mouth daily.   Yes Historical Provider, MD  Coral Calcium 1000 (390 CA) MG TABS Take 1 tablet by mouth daily.   Yes Historical Provider, MD  ferrous sulfate 325 (65 FE) MG tablet Take 1 tablet (325 mg total) by mouth 3 (three) times daily with meals. 08/31/15  Yes [name], NP  folic acid (FOLVITE) 1 MG tablet Take 1 mg by mouth daily. 01/11/16  Yes Historical Provider, MD  furosemide (LASIX) 40 MG tablet Take 1 tablet (40 mg total) by mouth daily. 01/19/15  Yes Orson Eva, MD  HYDROcodone-acetaminophen (NORCO/VICODIN) 5-325 MG per tablet Take 1 tablet by mouth every 6 (six) hours as needed for moderate pain.   Yes Historical Provider, MD  levothyroxine (SYNTHROID, LEVOTHROID) 100 MCG tablet Take 1 tablet (100 mcg total) by mouth daily at 6 (six) AM. 02/24/16  Yes [name], NP  losartan (COZAAR) 50 MG tablet TAKE 1 TABLET (50 MG TOTAL) BY MOUTH DAILY. 08/20/15  Yes [name], NP  methotrexate 50 MG/2ML injection Inject 50 mg into the skin every Friday. 12/13/15  Yes Historical Provider, MD  metoprolol succinate (TOPROL-XL) 25 MG 24 hr tablet TAKE 1 TABLET (25 MG TOTAL) BY MOUTH DAILY. 08/20/15  Yes [name], NP  NON FORMULARY 2 liter of oxygen   Yes Historical Provider, MD  predniSONE (DELTASONE) 10 MG tablet Take 2 tablets (20 mg total) by mouth at bedtime. 07/11/15  Yes Modena Jansky, MD  triamcinolone acetonide (KENALOG) 40 MG/ML injection Inject 40 mg into the muscle every 3 (three) months. For knee pain   Yes Historical Provider, MD  triamcinolone ointment (KENALOG) 0.1 % Apply 1 application topically 2 (two) times daily. Once or twice weekly as needed for break out of Sarcoidosis   Yes Historical Provider, MD     Allergies:    No Known Allergies   Physical Exam:   Vitals  Blood pressure 135/86, pulse 84, temperature 100.2 F (37.9 C), resp. rate 22, last menstrual period 06/09/2016, SpO2 95 %.   1. General  lying in bed in NAD,   2. Normal affect and insight, Not Suicidal or Homicidal, Awake Alert, Oriented X 3.  3. No F.N deficits, ALL C.Nerves Intact, Strength 5/5 all 4 extremities, Sensation intact all 4 extremities, Plantars down going.  4. Ears and Eyes appear Normal,  Conjunctivae clear, PERRLA. Moist Oral Mucosa.  5.  Supple Neck, No JVD, No cervical lymphadenopathy appriciated, No Carotid Bruits.  6. Symmetrical Chest wall movement, Good air movement bilaterally, CTAB.  7. RRR, No Gallops, Rubs or Murmurs, No Parasternal Heave.  8. Morbid obeisity Positive Bowel Sounds, Abdomen Soft, No tenderness, No organomegaly appriciated,No rebound -guarding or rigidity.  9.  No Cyanosis, Normal Skin Turgor, No Skin Rash or Bruise.  10. Good muscle tone,  joints appear normal , no effusions, Normal ROM.  11. No Palpable Lymph Nodes in Neck or Axillae    Data Review:    CBC  Recent Labs Lab 06/16/16 0000 06/16/16 0011  WBC 11.8*  --   HGB 7.6* 10.2*  HCT 29.1* 30.0*  PLT 242  --   MCV 67.5*  --   MCH 17.6*  --   MCHC 26.1*  --   RDW 26.4*  --   LYMPHSABS 0.7  --   MONOABS 0.8  --   EOSABS 0.0  --   BASOSABS 0.0  --    ------------------------------------------------------------------------------------------------------------------  Chemistries   Recent Labs Lab 06/16/16 0011  NA 141  K 4.1  CL 100*  GLUCOSE 107*  BUN 15  CREATININE 0.70   ------------------------------------------------------------------------------------------------------------------ CrCl cannot be calculated (Unknown ideal weight.). ------------------------------------------------------------------------------------------------------------------ No results for input(s): TSH, T4TOTAL, T3FREE, THYROIDAB in the last 72 hours.  Invalid input(s): FREET3  Coagulation profile No results for input(s): INR, PROTIME in the last 168 hours. ------------------------------------------------------------------------------------------------------------------- No results for input(s): DDIMER in the last 72 hours. -------------------------------------------------------------------------------------------------------------------  Cardiac Enzymes No results for input(s): CKMB, TROPONINI, MYOGLOBIN in the last 168  hours.  Invalid input(s): CK ------------------------------------------------------------------------------------------------------------------    Component Value Date/Time   BNP 62.0 06/16/2016 0000     ---------------------------------------------------------------------------------------------------------------  Urinalysis    Component Value Date/Time   COLORURINE YELLOW 06/16/2016 0100   APPEARANCEUR CLOUDY (A) 06/16/2016 0100   LABSPEC 1.021 06/16/2016 0100   PHURINE 6.0 06/16/2016 0100   GLUCOSEU NEGATIVE 06/16/2016 0100   HGBUR LARGE (A) 06/16/2016 0100   BILIRUBINUR NEGATIVE 06/16/2016 0100   KETONESUR NEGATIVE 06/16/2016 0100   PROTEINUR NEGATIVE 06/16/2016 0100   UROBILINOGEN 0.2 09/10/2012 0106   NITRITE NEGATIVE 06/16/2016 0100   LEUKOCYTESUR NEGATIVE 06/16/2016 0100    ----------------------------------------------------------------------------------------------------------------   Imaging Results:    Dg Chest 2 View  Result Date: 06/15/2016 CLINICAL DATA:  Acute onset of worsening shortness of breath. Initial encounter. EXAM: CHEST  2 VIEW COMPARISON:  Chest radiograph performed 02/26/2016 FINDINGS: Vascular congestion is noted. Increased interstitial markings raise concern for pulmonary edema. No pleural effusion or pneumothorax is seen. The cardiomediastinal silhouette is borderline enlarged. No acute osseous abnormalities are identified. IMPRESSION: Vascular congestion and borderline cardiomegaly. Increased interstitial markings raise concern for pulmonary edema. Electronically Signed   By: Garald Balding M.D.   On: 06/15/2016 23:43      Assessment & Plan:    Principal Problem:   Symptomatic anemia    1. Symptomatic Anemia ? Secondary to period Transfuse 1 unit prbc  Repeat cbc after transfusion   DVT Prophylaxis SCDs  AM Labs Ordered, also please review Full Orders  Family Communication: Admission, patients condition and plan of care including  tests being ordered have been discussed with the patient  who indicate understanding and agree with the plan and Code Status.  Code Status FULL CODE  Likely DC to   Condition GUARDED   Consults called:   Admission status: observation  Time spent in minutes : 45  minutes   Jani Gravel M.D on 06/16/2016 at 2:45 AM  Between 7am to 7pm - Pager - [phone]  After 7pm go to [email or web address] - password [facility]  [facility] - Office  [phone]

## 2016-06-16 NOTE — ED Notes (Signed)
Attempted to call report to [place], RN not available at this time for report

## 2016-06-17 DIAGNOSIS — D649 Anemia, unspecified: Secondary | ICD-10-CM | POA: Diagnosis not present

## 2016-06-17 DIAGNOSIS — I110 Hypertensive heart disease with heart failure: Secondary | ICD-10-CM | POA: Diagnosis present

## 2016-06-17 DIAGNOSIS — D500 Iron deficiency anemia secondary to blood loss (chronic): Secondary | ICD-10-CM | POA: Diagnosis present

## 2016-06-17 DIAGNOSIS — Z79899 Other long term (current) drug therapy: Secondary | ICD-10-CM | POA: Diagnosis not present

## 2016-06-17 DIAGNOSIS — Z6841 Body Mass Index (BMI) 40.0 and over, adult: Secondary | ICD-10-CM | POA: Diagnosis not present

## 2016-06-17 DIAGNOSIS — R0600 Dyspnea, unspecified: Secondary | ICD-10-CM | POA: Diagnosis not present

## 2016-06-17 DIAGNOSIS — D869 Sarcoidosis, unspecified: Secondary | ICD-10-CM | POA: Diagnosis present

## 2016-06-17 DIAGNOSIS — J449 Chronic obstructive pulmonary disease, unspecified: Secondary | ICD-10-CM | POA: Diagnosis present

## 2016-06-17 DIAGNOSIS — I5033 Acute on chronic diastolic (congestive) heart failure: Secondary | ICD-10-CM

## 2016-06-17 DIAGNOSIS — Z7952 Long term (current) use of systemic steroids: Secondary | ICD-10-CM | POA: Diagnosis not present

## 2016-06-17 DIAGNOSIS — Z9981 Dependence on supplemental oxygen: Secondary | ICD-10-CM | POA: Diagnosis not present

## 2016-06-17 DIAGNOSIS — E039 Hypothyroidism, unspecified: Secondary | ICD-10-CM | POA: Diagnosis present

## 2016-06-17 DIAGNOSIS — G4733 Obstructive sleep apnea (adult) (pediatric): Secondary | ICD-10-CM | POA: Diagnosis present

## 2016-06-17 DIAGNOSIS — Z91048 Other nonmedicinal substance allergy status: Secondary | ICD-10-CM | POA: Diagnosis not present

## 2016-06-17 LAB — COMPREHENSIVE METABOLIC PANEL
ALBUMIN: 3.7 g/dL (ref 3.5–5.0)
ALT: 12 U/L — AB (ref 14–54)
AST: 31 U/L (ref 15–41)
Alkaline Phosphatase: 34 U/L — ABNORMAL LOW (ref 38–126)
Anion gap: 12 (ref 5–15)
BUN: 12 mg/dL (ref 6–20)
CHLORIDE: 102 mmol/L (ref 101–111)
CO2: 25 mmol/L (ref 22–32)
CREATININE: 0.74 mg/dL (ref 0.44–1.00)
Calcium: 8.5 mg/dL — ABNORMAL LOW (ref 8.9–10.3)
GFR calc Af Amer: >60 mL/min (ref >60)
Glucose, Bld: 124 mg/dL — ABNORMAL HIGH (ref 65–99)
POTASSIUM: 4.8 mmol/L (ref 3.5–5.1)
SODIUM: 139 mmol/L (ref 135–145)
Total Bilirubin: 0.7 mg/dL (ref 0.3–1.2)
Total Protein: 7.3 g/dL (ref 6.5–8.1)

## 2016-06-17 LAB — CBC WITH DIFFERENTIAL/PLATELET
BASOS ABS: 0 10*3/uL (ref 0.0–0.1)
Basophils Relative: 0 %
EOS PCT: 0 %
Eosinophils Absolute: 0 10*3/uL (ref 0.0–0.7)
HEMATOCRIT: 31.1 % — AB (ref 36.0–46.0)
HEMOGLOBIN: 8.2 g/dL — AB (ref 12.0–15.0)
LYMPHS ABS: 0.7 10*3/uL (ref 0.7–4.0)
LYMPHS PCT: 6 %
MCH: 18.1 pg — ABNORMAL LOW (ref 26.0–34.0)
MCHC: 26.4 g/dL — AB (ref 30.0–36.0)
MCV: 68.7 fL — ABNORMAL LOW (ref 78.0–100.0)
MONOS PCT: 4 %
Monocytes Absolute: 0.5 10*3/uL (ref 0.1–1.0)
NEUTROS ABS: 10.5 10*3/uL — AB (ref 1.7–7.7)
Neutrophils Relative %: 90 %
Platelets: 234 10*3/uL (ref 150–400)
RBC: 4.53 MIL/uL (ref 3.87–5.11)
RDW: 26.2 % — AB (ref 11.5–15.5)
WBC: 11.7 10*3/uL — AB (ref 4.0–10.5)

## 2016-06-17 LAB — TYPE AND SCREEN
ABO/RH(D): A POS
Antibody Screen: NEGATIVE
Unit division: 0

## 2016-06-17 LAB — MAGNESIUM: MAGNESIUM: 1.6 mg/dL — AB (ref 1.7–2.4)

## 2016-06-17 MED ORDER — FUROSEMIDE 10 MG/ML IJ SOLN
40.0000 mg | Freq: Two times a day (BID) | INTRAMUSCULAR | Status: DC
  Administered 2016-06-17 – 2016-06-18 (×3): 40 mg via INTRAVENOUS
  Filled 2016-06-17 (×4): qty 4

## 2016-06-17 MED ORDER — VITAMIN B-12 1000 MCG PO TABS
1000.0000 ug | ORAL_TABLET | Freq: Every day | ORAL | Status: DC
  Administered 2016-06-17 – 2016-06-19 (×3): 1000 ug via ORAL
  Filled 2016-06-17 (×3): qty 1

## 2016-06-17 MED ORDER — MAGNESIUM SULFATE 2 GM/50ML IV SOLN
2.0000 g | Freq: Once | INTRAVENOUS | Status: AC
  Administered 2016-06-17: 2 g via INTRAVENOUS
  Filled 2016-06-17: qty 50

## 2016-06-17 MED ORDER — SODIUM CHLORIDE 0.9 % IV SOLN
510.0000 mg | Freq: Once | INTRAVENOUS | Status: AC
  Administered 2016-06-17: 510 mg via INTRAVENOUS
  Filled 2016-06-17: qty 17

## 2016-06-17 NOTE — Progress Notes (Signed)
[facility] Progress Note  Patient: [name] [id number]   PCP: [name], NP DOB: 08/31/1966   DOA: 06/15/2016   DOS: 06/17/2016   Date of Service: the patient was seen and examined on 06/17/2016  Subjective: Patient continues to complain of shortness of breath without any improvement. No chest pain or abdominal pain. No fever no chills. No cough. Nutrition: Tolerating oral diet  Brief hospital course: Pt. with PMH of OSA, HTN, sarcoidosis, diastolic dysfunction, menorrhagia, chronic anemia of iron deficiency; admitted on 06/15/2016, with complaint of shortness of breath, was found to have symptomatic anemia as well as acute on chronic CHF. Currently further plan is continue IV diuresis.  Assessment and Plan: 1. Symptomatic anemia Status post 2 units of PRBC. Despite improvement in patient's H&H the patient's symptoms has not improved. Iron level significantly low. I will give with the patient IV iron. Patient would benefit from outpatient IV iron therapy as well. Continue folic acid, and vitamin B-12. Patient will benefit from follow-up with OB/GYN to discuss regarding hysterectomy for menorrhagia.  2. Acute on chronic diastolic dysfunction. Patient symptoms are not improving despite adequate improvement and IV hydration. Next and the patient is roughly 20pounds higher than her baseline. At present I would continue using IV Lasix and increase the frequency to twice a day. Monitor ins and outs. Daily weight.  3. OSA. Continuing C Pap  4. Sarcoidosis. Continuing prednisone. Next on holding methotrexate in the setting of anemia.  5. Essential hypertension. Continuing home medication.  6. Hypothyroidism. Continuing Synthroid.  Pain management: When necessary Tylenol Activity: Consulted physical therapy Bowel regimen: last BM 06/17/2016 Diet: Cardiac diet DVT Prophylaxis: Mechanical compression  Advance goals of care discussion: Full code  Family  Communication: no family was present at bedside, at the time of interview.   Disposition:  Discharge to home. Expected discharge date: 06/18/2016, pending improvement in shortness of breath Consultants: none Procedures: none  Antibiotics: Anti-infectives    None        Intake/Output Summary (Last 24 hours) at 06/17/16 1642 Last data filed at 06/17/16 1000  Gross per 24 hour  Intake              425 ml  Output             2600 ml  Net            -2175 ml   Filed Weights   06/16/16 1208 06/17/16 0428  Weight: (!) 227.1 kg (500 lb 9.6 oz) (!) 227.4 kg (501 lb 6.4 oz)    Objective: Physical Exam: Vitals:   06/16/16 2115 06/17/16 0226 06/17/16 0428 06/17/16 1418  BP: (!) 142/80 126/61 (!) 143/79 134/64  Pulse: 93 86 82 83  Resp: 16 16 16 16   Temp: 98.7 F (37.1 C) 98.2 F (36.8 C) 98 F (36.7 C) 99.3 F (37.4 C)  TempSrc: Oral Oral Oral Oral  SpO2: 100% 93% 92% 97%  Weight:   (!) 227.4 kg (501 lb 6.4 oz)   Height:        General: Alert, Awake and Oriented to Time, Place and Person. Appear in mild distress Eyes: PERRL, Conjunctiva normal ENT: Oral Mucosa clear moist. Neck: difficult to assess JVD, no Abnormal Mass Or lumps Cardiovascular: S1 and S2 Present, no Murmur, Respiratory: Bilateral Air entry equal and Decreased, Clear to Auscultation, no Crackles, no wheezes Abdomen: Bowel Sound present, Soft and no tenderness Skin: no redness, no Rash  Extremities: bilateral Pedal edema, no calf tenderness  Neurologic: Grossly no focal neuro deficit. Bilaterally Equal motor strength  Data Reviewed: CBC:  Recent Labs Lab 06/16/16 0000 06/16/16 0011 06/16/16 1126 06/16/16 1924 06/17/16 0740  WBC 11.8*  --  8.6  --  11.7*  NEUTROABS 10.3*  --   --   --  10.5*  HGB 7.6* 10.2* 7.6* 8.6* 8.2*  HCT 29.1* 30.0* 29.4* 33.6* 31.1*  MCV 67.5*  --  67.7*  --  68.7*  PLT 242  --  224  --  [id number]   Basic Metabolic Panel:  Recent Labs Lab 06/16/16 0011 06/17/16 0533  NA  141 139  K 4.1 4.8  CL 100* 102  CO2  --  25  GLUCOSE 107* 124*  BUN 15 12  CREATININE 0.70 0.74  CALCIUM  --  8.5*  MG  --  1.6*    Liver Function Tests:  Recent Labs Lab 06/17/16 0533  AST 31  ALT 12*  ALKPHOS 34*  BILITOT 0.7  PROT 7.3  ALBUMIN 3.7   No results for input(s): LIPASE, AMYLASE in the last 168 hours. No results for input(s): AMMONIA in the last 168 hours. Coagulation Profile: No results for input(s): INR, PROTIME in the last 168 hours. Cardiac Enzymes: No results for input(s): CKTOTAL, CKMB, CKMBINDEX, TROPONINI in the last 168 hours. BNP (last 3 results) No results for input(s): PROBNP in the last 8760 hours.  CBG: No results for input(s): GLUCAP in the last 168 hours.  Studies: No results found.   Scheduled Meds: . antiseptic oral rinse  7 mL Mouth Rinse q12n4p  . chlorhexidine  15 mL Mouth Rinse BID  . cholecalciferol  400 Units Oral Daily  . ferrous sulfate  325 mg Oral TID WC  . folic acid  1 mg Oral Daily  . furosemide  40 mg Intravenous BID  . levothyroxine  100 mcg Oral QAC breakfast  . loratadine  10 mg Oral Daily  . losartan  50 mg Oral Daily  . metoprolol succinate  25 mg Oral Daily  . predniSONE  20 mg Oral QHS  . sodium chloride flush  3 mL Intravenous Q12H  . sodium chloride flush  3 mL Intravenous Q12H  . vitamin B-12  1,000 mcg Oral Daily   Continuous Infusions:  PRN Meds: sodium chloride, acetaminophen **OR** acetaminophen, albuterol, HYDROcodone-acetaminophen, sodium chloride flush  Time spent: 30 minutes  Author: Berle Mull, MD [facility] Pager: [phone] 06/17/2016 4:42 PM  If 7PM-7AM, please contact night-coverage at [email or web address], password [email or web address]

## 2016-06-17 NOTE — Progress Notes (Signed)
Pt refused to wear our CPAP tonight.  States that it is too loud and she can't sleep with it on.  States that her son is bringing hers from home tomorrow and wishes to wait until then.  RT will continue to monitor as needed.

## 2016-06-17 NOTE — Progress Notes (Signed)
Pt's son was supposed to bring in Pt's CPAP machine but forgot.  RT offered Pt a hospital provided CPAP but she declined stating that it was too noisy.  Pt to notify RT if she changes her mind, RT to monitor and assess as needed.

## 2016-06-18 LAB — CBC
HEMATOCRIT: 31 % — AB (ref 36.0–46.0)
HEMOGLOBIN: 8.1 g/dL — AB (ref 12.0–15.0)
MCH: 18 pg — AB (ref 26.0–34.0)
MCHC: 26.1 g/dL — AB (ref 30.0–36.0)
MCV: 69 fL — ABNORMAL LOW (ref 78.0–100.0)
Platelets: 267 10*3/uL (ref 150–400)
RBC: 4.49 MIL/uL (ref 3.87–5.11)
RDW: 26.5 % — AB (ref 11.5–15.5)
WBC: 10.9 10*3/uL — ABNORMAL HIGH (ref 4.0–10.5)

## 2016-06-18 LAB — BASIC METABOLIC PANEL
Anion gap: 7 (ref 5–15)
BUN: 14 mg/dL (ref 6–20)
CALCIUM: 8.5 mg/dL — AB (ref 8.9–10.3)
CHLORIDE: 98 mmol/L — AB (ref 101–111)
CO2: 34 mmol/L — AB (ref 22–32)
CREATININE: 0.59 mg/dL (ref 0.44–1.00)
GFR calc non Af Amer: >60 mL/min (ref >60)
Glucose, Bld: 137 mg/dL — ABNORMAL HIGH (ref 65–99)
Potassium: 4 mmol/L (ref 3.5–5.1)
SODIUM: 139 mmol/L (ref 135–145)

## 2016-06-18 LAB — MAGNESIUM: Magnesium: 1.9 mg/dL (ref 1.7–2.4)

## 2016-06-18 NOTE — Progress Notes (Signed)
[facility] Progress Note  Patient: [name] [id number]   PCP: [name], NP DOB: 04-04-1966   DOA: 06/15/2016   DOS: 06/18/2016   Date of Service: the patient was seen and examined on 06/18/2016  Subjective: Patient is mentioned that her shortness of breath is improving. No chest pain and abdominal pain. Leg swelling is also improving. Nutrition: Tolerating oral diet  Brief hospital course: Pt. with PMH of OSA, HTN, sarcoidosis, diastolic dysfunction, menorrhagia, chronic anemia of iron deficiency; admitted on 06/15/2016, with complaint of shortness of breath, was found to have symptomatic anemia as well as acute on chronic CHF. Currently further plan is continue IV diuresis.  Assessment and Plan: 1. Symptomatic anemia Status post 2 units of PRBC. Despite improvement in patient's H&H the patient's symptoms has not improved. Iron level significantly low. I will give with the patient IV iron. Patient would benefit from outpatient IV iron therapy as well. Continue folic acid, and vitamin B-12. Patient will benefit from follow-up with OB/GYN to discuss regarding hysterectomy for menorrhagia.  2. Acute on chronic diastolic dysfunction. Patient symptoms are not improving despite adequate improvement and IV hydration. Next and the patient is roughly 20pounds higher than her baseline. At present I would continue using IV Lasix twice a day. Monitor ins and outs. Daily weight.  3. OSA. Continuing C Pap  4. Sarcoidosis. Continuing prednisone. Next on holding methotrexate in the setting of anemia.  5. Essential hypertension. Continuing home medication.  6. Hypothyroidism. Continuing Synthroid.  Pain management: When necessary Tylenol Activity: Consulted physical therapy Bowel regimen: last BM 06/18/2016 Diet: Cardiac diet DVT Prophylaxis: Mechanical compression  Advance goals of care discussion: Full code  Family Communication: no family was present at  bedside, at the time of interview.   Disposition:  Discharge to home. Expected discharge date: 06/19/2016, pending improvement in shortness of breath Consultants: none Procedures: none  Antibiotics: Anti-infectives    None        Intake/Output Summary (Last 24 hours) at 06/18/16 1819 Last data filed at 06/18/16 1345  Gross per 24 hour  Intake              600 ml  Output             3300 ml  Net            -2700 ml   Filed Weights   06/16/16 1208 06/17/16 0428 06/18/16 0641  Weight: (!) 227.1 kg (500 lb 9.6 oz) (!) 227.4 kg (501 lb 6.4 oz) (!) 226.1 kg (498 lb 8 oz)    Objective: Physical Exam: Vitals:   06/17/16 2202 06/18/16 0148 06/18/16 0641 06/18/16 1537  BP: 127/64 (!) 129/53 130/71 120/87  Pulse: 82 79 81 88  Resp: 18 18 18 18   Temp: 98.2 F (36.8 C) 98.5 F (36.9 C) 98.5 F (36.9 C) 97.8 F (36.6 C)  TempSrc: Oral Oral Oral Oral  SpO2: 98% 98% 99% 100%  Weight:   (!) 226.1 kg (498 lb 8 oz)   Height:        General: Alert, Awake and Oriented to Time, Place and Person. Appear in mild distress Eyes: PERRL, Conjunctiva normal ENT: Oral Mucosa clear moist. Neck: difficult to assess JVD, no Abnormal Mass Or lumps Cardiovascular: S1 and S2 Present, no Murmur, Respiratory: Bilateral Air entry equal and Decreased, Clear to Auscultation, no Crackles, no wheezes Abdomen: Bowel Sound present, Soft and no tenderness Skin: no redness, no Rash  Extremities: Improving bilateral Pedal edema, no  calf tenderness Neurologic: Grossly no focal neuro deficit. Bilaterally Equal motor strength  Data Reviewed: CBC:  Recent Labs Lab 06/16/16 0000 06/16/16 0011 06/16/16 1126 06/16/16 1924 06/17/16 0740 06/18/16 0519  WBC 11.8*  --  8.6  --  11.7* 10.9*  NEUTROABS 10.3*  --   --   --  10.5*  --   HGB 7.6* 10.2* 7.6* 8.6* 8.2* 8.1*  HCT 29.1* 30.0* 29.4* 33.6* 31.1* 31.0*  MCV 67.5*  --  67.7*  --  68.7* 69.0*  PLT 242  --  224  --  234 [id number]   Basic Metabolic  Panel:  Recent Labs Lab 06/16/16 0011 06/17/16 0533 06/18/16 0519  NA 141 139 139  K 4.1 4.8 4.0  CL 100* 102 98*  CO2  --  25 34*  GLUCOSE 107* 124* 137*  BUN 15 12 14   CREATININE 0.70 0.74 0.59  CALCIUM  --  8.5* 8.5*  MG  --  1.6* 1.9    Liver Function Tests:  Recent Labs Lab 06/17/16 0533  AST 31  ALT 12*  ALKPHOS 34*  BILITOT 0.7  PROT 7.3  ALBUMIN 3.7   No results for input(s): LIPASE, AMYLASE in the last 168 hours. No results for input(s): AMMONIA in the last 168 hours. Coagulation Profile: No results for input(s): INR, PROTIME in the last 168 hours. Cardiac Enzymes: No results for input(s): CKTOTAL, CKMB, CKMBINDEX, TROPONINI in the last 168 hours. BNP (last 3 results) No results for input(s): PROBNP in the last 8760 hours.  CBG: No results for input(s): GLUCAP in the last 168 hours.  Studies: No results found.   Scheduled Meds: . antiseptic oral rinse  7 mL Mouth Rinse q12n4p  . chlorhexidine  15 mL Mouth Rinse BID  . cholecalciferol  400 Units Oral Daily  . ferrous sulfate  325 mg Oral TID WC  . folic acid  1 mg Oral Daily  . furosemide  40 mg Intravenous BID  . levothyroxine  100 mcg Oral QAC breakfast  . loratadine  10 mg Oral Daily  . losartan  50 mg Oral Daily  . metoprolol succinate  25 mg Oral Daily  . predniSONE  20 mg Oral QHS  . sodium chloride flush  3 mL Intravenous Q12H  . sodium chloride flush  3 mL Intravenous Q12H  . vitamin B-12  1,000 mcg Oral Daily   Continuous Infusions:  PRN Meds: sodium chloride, acetaminophen **OR** acetaminophen, albuterol, HYDROcodone-acetaminophen, sodium chloride flush  Time spent: 30 minutes  Author: Berle Mull, MD [facility] Pager: [phone] 06/18/2016 6:19 PM  If 7PM-7AM, please contact night-coverage at [email or web address], password [email or web address]

## 2016-06-18 NOTE — Progress Notes (Signed)
Patients IV access dislodged, leaking, no longer patent, IV removed. Patient refusing additional IV access to be placed. Notified Dr. Posey Pronto.

## 2016-06-19 LAB — BASIC METABOLIC PANEL
Anion gap: 8 (ref 5–15)
BUN: 13 mg/dL (ref 6–20)
CO2: 32 mmol/L (ref 22–32)
CREATININE: 0.59 mg/dL (ref 0.44–1.00)
Calcium: 8.4 mg/dL — ABNORMAL LOW (ref 8.9–10.3)
Chloride: 100 mmol/L — ABNORMAL LOW (ref 101–111)
GFR calc Af Amer: >60 mL/min (ref >60)
GLUCOSE: 122 mg/dL — AB (ref 65–99)
Potassium: 4.3 mmol/L (ref 3.5–5.1)
SODIUM: 140 mmol/L (ref 135–145)

## 2016-06-19 LAB — MAGNESIUM: MAGNESIUM: 1.8 mg/dL (ref 1.7–2.4)

## 2016-06-19 MED ORDER — FUROSEMIDE 20 MG PO TABS: 60.0000 mg | ORAL_TABLET | Freq: Two times a day (BID) | ORAL | 0 refills | Status: DC

## 2016-06-19 MED ORDER — FUROSEMIDE 40 MG PO TABS: 60.0000 mg | ORAL_TABLET | Freq: Two times a day (BID) | ORAL | Status: DC

## 2016-06-19 MED ORDER — CYANOCOBALAMIN 1000 MCG PO TABS: 1000.0000 ug | ORAL_TABLET | Freq: Every day | ORAL | 0 refills | Status: AC

## 2016-06-19 NOTE — Progress Notes (Signed)
Discharge instructions explained to pt, she states she understands them. Next doses of medication are written on discharge instructions. Pt ambulating to bathroom and back to chair with O2 on 2L via [place]. Son picking up pt and taking her home. Discharged via wheelchair

## 2016-06-20 ENCOUNTER — Telehealth: Payer: Self-pay | Admitting: Pulmonary Disease

## 2016-06-20 DIAGNOSIS — J9611 Chronic respiratory failure with hypoxia: Secondary | ICD-10-CM

## 2016-06-20 DIAGNOSIS — D869 Sarcoidosis, unspecified: Secondary | ICD-10-CM

## 2016-06-20 NOTE — Telephone Encounter (Signed)
Please have her come in either this afternoon or tomorrow to have her BMP drawn early. Make the lab result to the attention of both TP and Dr. Elsworth Soho so that we can assess to ensure intact renal function.

## 2016-06-20 NOTE — Telephone Encounter (Signed)
Called spoke with pt. She reports she was released from the hospital yesterday. She reports her lasix was to 120 mg daily. She reports she is not urinating like she should. She is using the bathroom about once after she takes the lasix. She reports she was told she needed her kidney function repeated at her OV w/ TP on Wednesday. She called PCP but they are closed today reason she called our office. RA is on night float. Will forward to DOD for recs. Please advise Dr. Lamonte Sakai thanks

## 2016-06-20 NOTE — Telephone Encounter (Signed)
I have placed the order under TP. Pt is going to have the lab drawn tomorrow. Nothing further needed

## 2016-06-20 NOTE — Telephone Encounter (Signed)
Patient returned call, CB is [phone].

## 2016-06-20 NOTE — Telephone Encounter (Signed)
lmtcb X1 for pt  

## 2016-06-21 ENCOUNTER — Encounter: Payer: Self-pay | Admitting: Adult Health

## 2016-06-21 ENCOUNTER — Ambulatory Visit (INDEPENDENT_AMBULATORY_CARE_PROVIDER_SITE_OTHER): Payer: Medicare Other | Admitting: Adult Health

## 2016-06-21 VITALS — BP 162/88 | Temp 98.6°F | Ht 66.0 in | Wt >= 6400 oz

## 2016-06-21 DIAGNOSIS — I10 Essential (primary) hypertension: Secondary | ICD-10-CM | POA: Diagnosis not present

## 2016-06-21 DIAGNOSIS — Z5189 Encounter for other specified aftercare: Secondary | ICD-10-CM | POA: Diagnosis not present

## 2016-06-21 DIAGNOSIS — R0602 Shortness of breath: Secondary | ICD-10-CM | POA: Diagnosis not present

## 2016-06-21 DIAGNOSIS — D649 Anemia, unspecified: Secondary | ICD-10-CM | POA: Diagnosis not present

## 2016-06-21 DIAGNOSIS — Z09 Encounter for follow-up examination after completed treatment for conditions other than malignant neoplasm: Secondary | ICD-10-CM

## 2016-06-21 LAB — BASIC METABOLIC PANEL
BUN: 12 mg/dL (ref 6–23)
CALCIUM: 8.9 mg/dL (ref 8.4–10.5)
CHLORIDE: 98 meq/L (ref 96–112)
CO2: 35 meq/L — AB (ref 19–32)
Creatinine, Ser: 0.69 mg/dL (ref 0.40–1.20)
GFR: 115.48 mL/min (ref >60.00)
GLUCOSE: 108 mg/dL — AB (ref 70–99)
POTASSIUM: 4.1 meq/L (ref 3.5–5.1)
SODIUM: 141 meq/L (ref 135–145)

## 2016-06-21 LAB — CBC
HEMATOCRIT: 33.2 % — AB (ref 36.0–46.0)
Hemoglobin: 9.6 g/dL — ABNORMAL LOW (ref 12.0–15.0)
MCHC: 29 g/dL — ABNORMAL LOW (ref 30.0–36.0)
MCV: 68.2 fl — ABNORMAL LOW (ref 78.0–100.0)
Platelets: 250 10*3/uL (ref 150.0–400.0)
RBC: 4.86 Mil/uL (ref 3.87–5.11)
RDW: 30.5 % — ABNORMAL HIGH (ref 11.5–15.5)
WBC: 10.8 10*3/uL — AB (ref 4.0–10.5)

## 2016-06-21 LAB — FOLATE RBC
FOLATE, HEMOLYSATE: 542.5 ng/mL
Folate, RBC: 1852 ng/mL (ref >498)
Hematocrit: 29.3 % — ABNORMAL LOW (ref 34.0–46.6)

## 2016-06-21 NOTE — Discharge Summary (Signed)
[facility] Discharge Summary   Patient: [name] [id number]   PCP: [name], NP DOB: 1966/09/11   Date of admission: 06/15/2016   Date of discharge: 06/19/2016     Discharge Diagnoses:  Principal Problem:   Symptomatic anemia Active Problems:   OSA (obstructive sleep apnea)   Hypertension   Sarcoidosis ([place])   Diastolic dysfunction with acute on chronic heart failure ([place])   Admitted From: Home Disposition:  Home  Recommendations for Outpatient Follow-up:  1. Follow-up with PCP in one week   Follow-up Information    [name], NP. Schedule an appointment as soon as possible for a visit in 1 week(s).   Specialty:  Family Medicine Why:  1) get CBC and BMP done, 2) get referral to hematology for IV iron, 3) get referral for GYN for excessive menstruation.  Contact information: [place] [phone]          Diet recommendation: Cardiac diet  Activity: The patient is advised to gradually reintroduce usual activities.  Discharge Condition: good  Code Status: Full code  History of present illness: As per the H and P dictated on admission, "[name]  is a 50 y.o. female, w/ hx of anemia, currently having her period c/o dyspnea which she relates to worsening anemia.  She therefore presented to ED today and was found to have anemia.  Hgb = 7.6.  Pt denies brbpr, hematuria, hemoptysis  ED requested obs admission for transfusion. Pt will be transfused 1 unit prbc.  "  Hospital Course:  Summary of her active problems in the hospital is as following. 1. Symptomatic anemia Status post 2 units of PRBC. Despite improvement in patient's H&H the patient's symptoms has not improved. Iron level significantly low. Patient was given 1 IV iron. Patient would benefit from outpatient IV iron therapy as well. Continue folic acid, and vitamin B-12. Patient will benefit from follow-up with OB/GYN to discuss  regarding hysterectomy for menorrhagia.  2. Acute on chronic diastolic dysfunction. Patient symptoms are not improving despite adequate improvement and IV hydration. Next and the patient is roughly 20pounds higher than her baseline. At present I would continue using high-dose Lasix on discharge and can follow-up with PCP and recheck BMP to adjust her Lasix dose in future. Monitor ins and outs. Daily weight.  3. OSA. Continuing C Pap  4. Sarcoidosis. Continuing prednisone.   5. Essential hypertension. Continuing home medication.  6. Hypothyroidism. Continuing Synthroid.   All other chronic medical condition were stable during the hospitalization.  Patient was ambulatory without any assistance. On the day of the discharge the patient's vitals were stable, and no other acute medical condition were reported by patient. the patient was felt safe to be discharge at home with family.  Procedures and Results:  PRBC transfusion   Consultations:  none  DISCHARGE MEDICATION: Discharge Medication List as of 06/19/2016 10:26 AM    START taking these medications   Details  vitamin B-12 1000 MCG tablet Take 1 tablet (1,000 mcg total) by mouth daily., Starting Sun 06/19/2016, Normal      CONTINUE these medications which have CHANGED   Details  furosemide (LASIX) 20 MG tablet Take 3 tablets (60 mg total) by mouth 2 (two) times daily., Starting Sun 06/19/2016, Normal      CONTINUE these medications which have NOT CHANGED   Details  acetaminophen (TYLENOL) 500 MG tablet Take 500-1,000 mg by mouth every 6 (six) hours as needed for moderate pain., Until  Discontinued, Historical Med    albuterol (PROVENTIL HFA;VENTOLIN HFA) 108 (90 Base) MCG/ACT inhaler Inhale 1-2 puffs into the lungs every 6 (six) hours as needed for wheezing., Starting 02/08/2016, Until Discontinued, Normal    cetirizine (ZYRTEC) 10 MG tablet Take 10 mg by mouth at bedtime as needed for allergies., Until Discontinued,  Historical Med    cholecalciferol (VITAMIN D) 400 UNITS TABS tablet Take 400 Units by mouth daily., Until Discontinued, Historical Med    Coral Calcium 1000 (390 CA) MG TABS Take 1 tablet by mouth daily., Until Discontinued, Historical Med    ferrous sulfate 325 (65 FE) MG tablet Take 1 tablet (325 mg total) by mouth 3 (three) times daily with meals., Starting 08/31/2015, Until Discontinued, Normal    folic acid (FOLVITE) 1 MG tablet Take 1 mg by mouth daily., Starting 01/11/2016, Until Discontinued, Historical Med    levothyroxine (SYNTHROID, LEVOTHROID) 100 MCG tablet Take 1 tablet (100 mcg total) by mouth daily at 6 (six) AM., Starting 02/24/2016, Until Discontinued, Normal    losartan (COZAAR) 50 MG tablet TAKE 1 TABLET (50 MG TOTAL) BY MOUTH DAILY., Normal    methotrexate 50 MG/2ML injection Inject 50 mg into the skin every Friday., Starting 12/13/2015, Until Discontinued, Historical Med    metoprolol succinate (TOPROL-XL) 25 MG 24 hr tablet TAKE 1 TABLET (25 MG TOTAL) BY MOUTH DAILY., Normal    NON FORMULARY 2 liter of oxygen, Until Discontinued, Historical Med    triamcinolone acetonide (KENALOG) 40 MG/ML injection Inject 40 mg into the muscle every 3 (three) months. For knee pain, Until Discontinued, Historical Med    triamcinolone ointment (KENALOG) 0.1 % Apply 1 application topically 2 (two) times daily. Once or twice weekly as needed for break out of Sarcoidosis, Until Discontinued, Historical Med    HYDROcodone-acetaminophen (NORCO/VICODIN) 5-325 MG per tablet Take 1 tablet by mouth every 6 (six) hours as needed for moderate pain., Until Discontinued, Historical Med    predniSONE (DELTASONE) 10 MG tablet Take 2 tablets (20 mg total) by mouth at bedtime., Starting 07/11/2015, Until Discontinued, No Print      STOP taking these medications     ciprofloxacin (CIPRO) 500 MG tablet      metroNIDAZOLE (FLAGYL) 500 MG tablet      ondansetron (ZOFRAN) 4 MG tablet       oxyCODONE-acetaminophen (PERCOCET) 10-325 MG tablet        No Known Allergies Discharge Instructions    Diet - low sodium heart healthy    Complete by:  As directed   Discharge instructions    Complete by:  As directed   It is important that you read following instructions as well as go over your medication list with RN to help you understand your care after this hospitalization.  Discharge Instructions: Please follow-up with PCP in one week  Please request your primary care physician to go over all Hospital Tests and Procedure/Radiological results at the follow up,  Please get all Hospital records sent to your PCP by signing hospital release before you go home.   Do not take more than prescribed Pain, Sleep and Anxiety Medications. You were cared for by a hospitalist during your hospital stay. If you have any questions about your discharge medications or the care you received while you were in the hospital after you are discharged, you can call the unit and ask to speak with the hospitalist on call if the hospitalist that took care of you is not available.  Once you are  discharged, your primary care physician will handle any further medical issues. Please note that NO REFILLS for any discharge medications will be authorized once you are discharged, as it is imperative that you return to your primary care physician (or establish a relationship with a primary care physician if you do not have one) for your aftercare needs so that they can reassess your need for medications and monitor your lab values. You Must read complete instructions/literature along with all the possible adverse reactions/side effects for all the Medicines you take and that have been prescribed to you. Take any new Medicines after you have completely understood and accept all the possible adverse reactions/side effects. Wear Seat belts while driving. If you have smoked or chewed Tobacco in the last 2 yrs please stop smoking  and/or stop any Recreational drug use.   Increase activity slowly    Complete by:  As directed     Discharge Exam: Filed Weights   06/17/16 0428 06/18/16 0641 06/19/16 0837  Weight: (!) 227.4 kg (501 lb 6.4 oz) (!) 226.1 kg (498 lb 8 oz) (!) 224.8 kg (495 lb 8 oz)   Vitals:   06/19/16 0609 06/19/16 0916  BP: 134/73 134/73  Pulse: 75 75  Resp: 20   Temp: 98.2 F (36.8 C)    General: Appear in no distress, no Rash; Oral Mucosa moist Cardiovascular: S1 and S2 Present, no Murmur, difficult to assess JVD Respiratory: Bilateral Air entry present and Clear to Auscultation, no Crackles, no wheezes Abdomen: Bowel Sound present, Soft and no tenderness Extremities: bilateral Pedal edema, no calf tenderness Neurology: Grossly no focal neuro deficit.  The results of significant diagnostics from this hospitalization (including imaging, microbiology, ancillary and laboratory) are listed below for reference.    Significant Diagnostic Studies: Dg Chest 2 View  Result Date: 06/15/2016 CLINICAL DATA:  Acute onset of worsening shortness of breath. Initial encounter. EXAM: CHEST  2 VIEW COMPARISON:  Chest radiograph performed 02/26/2016 FINDINGS: Vascular congestion is noted. Increased interstitial markings raise concern for pulmonary edema. No pleural effusion or pneumothorax is seen. The cardiomediastinal silhouette is borderline enlarged. No acute osseous abnormalities are identified. IMPRESSION: Vascular congestion and borderline cardiomegaly. Increased interstitial markings raise concern for pulmonary edema. Electronically Signed   By: Garald Balding M.D.   On: 06/15/2016 23:43    Microbiology: No results found for this or any previous visit (from the past 240 hour(s)).   Labs: CBC:  Recent Labs Lab 06/16/16 0000  06/16/16 1126 06/16/16 1924 06/17/16 0740 06/18/16 0519 06/21/16 0810  WBC 11.8*  --  8.6  --  11.7* 10.9* 10.8*  NEUTROABS 10.3*  --   --   --  10.5*  --   --   HGB 7.6*  <  > 7.6* 8.6* 8.2* 8.1* 9.6*  HCT 29.1*  < > 29.4* 33.6* 31.1* 31.0* 33.2*  MCV 67.5*  --  67.7*  --  68.7* 69.0* 68.2 Repeated and verified X2.*  PLT 242  --  224  --  234 267 250.0  < > = values in this interval not displayed. Basic Metabolic Panel:  Recent Labs Lab 06/16/16 0011 06/17/16 0533 06/18/16 0519 06/19/16 0525 06/21/16 0810  NA 141 139 139 140 141  K 4.1 4.8 4.0 4.3 4.1  CL 100* 102 98* 100* 98  CO2  --  25 34* 32 35*  GLUCOSE 107* 124* 137* 122* 108*  BUN 15 12 14 13 12   CREATININE 0.70 0.74 0.59 0.59 0.69  CALCIUM  --  8.5* 8.5* 8.4* 8.9  MG  --  1.6* 1.9 1.8  --    Liver Function Tests:  Recent Labs Lab 06/17/16 0533  AST 31  ALT 12*  ALKPHOS 34*  BILITOT 0.7  PROT 7.3  ALBUMIN 3.7   No results for input(s): LIPASE, AMYLASE in the last 168 hours. No results for input(s): AMMONIA in the last 168 hours. Cardiac Enzymes: No results for input(s): CKTOTAL, CKMB, CKMBINDEX, TROPONINI in the last 168 hours. BNP (last 3 results)  Recent Labs  02/26/16 2205 06/16/16 0000  BNP 42.9 62.0   CBG: No results for input(s): GLUCAP in the last 168 hours. Time spent: 30 minutes  Signed:  Berle Mull  [facility] 06/19/2016 , 6:31 PM

## 2016-06-21 NOTE — Patient Instructions (Signed)
It was great seeing you today! And I am glad you are feeling better.   I will get you referred by to Dr. Polly Cobia and the blood doctors  I will follow up with you regarding your blood work

## 2016-06-21 NOTE — Progress Notes (Signed)
Subjective:    Patient ID: [name], female    DOB: [birth year], 50 y.o.   MRN: [id number]  HPI  50 year old female who  has a past medical history of Anemia; Arthritis; CHF (congestive heart failure) ([place]); Chronic hyperventilation syndrome; COPD (chronic obstructive pulmonary disease) (HCC); Gallstones; GERD (gastroesophageal reflux disease); H/O hiatal hernia; Hypertension; Hypothyroidism; Kidney stones; Morbid obesity ([place]); Pneumonia; Sarcoidosis ([place]); Seasonal allergies; Shortness of breath; and Sleep apnea.  presents to the office today for hospital follow up. She was admitted on 06/15/2016 and discharged on 06/19/2016. She originally presented from home to the ER with a worsening period of dyspnea relating to anemia. On initial evaluation her Hgb was 7.6.   She was transfused with two units of PRBC's and had IV iron.   She was also 20 pounds over weight, likely due to fluid retention/ acute on chronic CHF. She had IV diureses.   It was recommended that she follow up with OB/GYN to discuss hysterectomy for menorrhagia as well as follow up with hemoc.   Upon discharge she was started on 120 mg lasix. She feels as though this is not working well for her and has not been urinating as much as she thinks she should, per patient she is only using the restroom once after she takes her medication.   [facility] She is also supposed to be see by [facility]  Today in the office she reports that " I am glad to be home and I am feeling better. My lungs still suck, but I am out of the hospital." She reports no fevers, increased SOB from her baseline or CP.    Review of Systems  Constitutional: Positive for activity change.  HENT: Negative.   Respiratory: Positive for shortness of breath. Negative for apnea, cough and wheezing.   Cardiovascular: Positive for leg swelling. Negative for chest pain and palpitations.  Gastrointestinal:  Negative.   Genitourinary: Positive for decreased urine volume.  Musculoskeletal: Positive for arthralgias.  Skin: Negative.   Neurological: Negative.   All other systems reviewed and are negative.  Past Medical History:  Diagnosis Date  . Anemia   . Arthritis    hands, shoulders, no meds  . CHF (congestive heart failure) (HCC)    EF60-65%  . Chronic hyperventilation syndrome    w/ obesity tx with albuterol inhaler and oxygen 2L  . COPD (chronic obstructive pulmonary disease) ([place])    uses oxygen 2 L  . Gallstones   . GERD (gastroesophageal reflux disease)    diet controlled - no meds  . H/O hiatal hernia   . Hypertension   . Hypothyroidism   . Kidney stones   . Morbid obesity ([place])   . Pneumonia    hoispitalized in 08/2011  . Sarcoidosis ([place])   . Seasonal allergies   . Shortness of breath   . Sleep apnea    uses CPAP machine     Social History   Social History  . Marital status: Single    Spouse name: N/A  . Number of children: N/A  . Years of education: N/A   Occupational History  . unemployeed    Social History Main Topics  . Smoking status: Never Smoker  . Smokeless tobacco: Never Used  . Alcohol use Yes     Comment: occasionally  . Drug use: No  . Sexual activity: No   Other Topics Concern  . Not on  file   Social History Narrative   Is not currently working.    Divorced for eight or nine years   Has one son who lives around here.        Past Surgical History:  Procedure Laterality Date  . [place]   x 1  . CHOLECYSTECTOMY  2000  . HYSTEROSCOPY W/D&C  12/27/2011   Procedure: DILATATION AND CURETTAGE /HYSTEROSCOPY;  Surgeon: Maeola Sarah. Landry Mellow, MD;  Location: [facility];  Service: Gynecology;;  . I and D of abcess  05/2011  . LUNG BIOPSY    . uterine abletion      Family History  Problem Relation Age of Onset  . Diabetes Father   . Diabetes Brother   . Deep vein thrombosis Mother   . Aneurysm Sister     d/o brain aneurysm     No Known Allergies  Current Outpatient Prescriptions on File Prior to Visit  Medication Sig Dispense Refill  . acetaminophen (TYLENOL) 500 MG tablet Take 500-1,000 mg by mouth every 6 (six) hours as needed for moderate pain.    Marland Kitchen albuterol (PROVENTIL HFA;VENTOLIN HFA) 108 (90 Base) MCG/ACT inhaler Inhale 1-2 puffs into the lungs every 6 (six) hours as needed for wheezing. 1 Inhaler 0  . cetirizine (ZYRTEC) 10 MG tablet Take 10 mg by mouth at bedtime as needed for allergies.    . cholecalciferol (VITAMIN D) 400 UNITS TABS tablet Take 400 Units by mouth daily.    . Coral Calcium 1000 (390 CA) MG TABS Take 1 tablet by mouth daily.    . ferrous sulfate 325 (65 FE) MG tablet Take 1 tablet (325 mg total) by mouth 3 (three) times daily with meals. [id number] tablet 11  . folic acid (FOLVITE) 1 MG tablet Take 1 mg by mouth daily.  3  . furosemide (LASIX) 20 MG tablet Take 3 tablets (60 mg total) by mouth 2 (two) times daily. 60 tablet 0  . levothyroxine (SYNTHROID, LEVOTHROID) 100 MCG tablet Take 1 tablet (100 mcg total) by mouth daily at 6 (six) AM. 30 tablet 4  . losartan (COZAAR) 50 MG tablet TAKE 1 TABLET (50 MG TOTAL) BY MOUTH DAILY. 30 tablet 5  . methotrexate 50 MG/2ML injection Inject 50 mg into the skin every Friday.  3  . metoprolol succinate (TOPROL-XL) 25 MG 24 hr tablet TAKE 1 TABLET (25 MG TOTAL) BY MOUTH DAILY. 30 tablet 5  . NON FORMULARY 2 liter of oxygen    . triamcinolone acetonide (KENALOG) 40 MG/ML injection Inject 40 mg into the muscle every 3 (three) months. For knee pain    . triamcinolone ointment (KENALOG) 0.1 % Apply 1 application topically 2 (two) times daily. Once or twice weekly as needed for break out of Sarcoidosis    . vitamin B-12 1000 MCG tablet Take 1 tablet (1,000 mcg total) by mouth daily. 30 tablet 0   No current facility-administered medications on file prior to visit.     BP (!) 162/88   Temp 98.6 F (37 C) (Oral)   Ht 5\' 6"  (1.676 m)   Wt (!) 501 lb 6.4 oz  (227.4 kg)   LMP 06/09/2016   BMI 80.93 kg/m       Objective:   Physical Exam  Constitutional: She is oriented to person, place, and time. She appears well-developed and well-nourished. No distress.  Morbidly obese  Cardiovascular: Normal rate, regular rhythm, normal heart sounds and intact distal pulses.  Exam reveals no gallop and no  friction rub.   No murmur heard. Pulmonary/Chest: Effort normal and breath sounds normal. No respiratory distress. She has no wheezes. She has no rales. She exhibits no tenderness.  On continuous oxygen   Musculoskeletal: Normal range of motion.  Neurological: She is alert and oriented to person, place, and time. She has normal reflexes.  Skin: Skin is warm and dry. No rash noted. She is not diaphoretic. No erythema. No pallor.  Psychiatric: She has a normal mood and affect. Her behavior is normal. Judgment and thought content normal.  Vitals reviewed.     Assessment & Plan:   1. Hospital discharge follow-up - Appears to be back to baseline for the patient.  - She really needs to lose weight. She continues to not want to talk about treatments for this issue.  - CBC - Basic metabolic panel  2. SOB (shortness of breath) - CBC - Basic metabolic panel   3. Symptomatic anemia - Continue with OTC iron - She would like to see hematology before deciding on IV iron supplement - CBC - Basic metabolic panel - Ambulatory referral to Obstetrics / Gynecology - Ambulatory referral to Hematology  4. Essential hypertension - Did not take her medications before visit today - CBC - Basic metabolic panel  [name], NP

## 2016-06-22 ENCOUNTER — Encounter: Payer: Self-pay | Admitting: Adult Health

## 2016-06-22 ENCOUNTER — Ambulatory Visit (INDEPENDENT_AMBULATORY_CARE_PROVIDER_SITE_OTHER): Payer: Medicare Other | Admitting: Adult Health

## 2016-06-22 DIAGNOSIS — D869 Sarcoidosis, unspecified: Secondary | ICD-10-CM

## 2016-06-22 DIAGNOSIS — G4733 Obstructive sleep apnea (adult) (pediatric): Secondary | ICD-10-CM | POA: Diagnosis not present

## 2016-06-22 DIAGNOSIS — I5032 Chronic diastolic (congestive) heart failure: Secondary | ICD-10-CM | POA: Diagnosis not present

## 2016-06-22 NOTE — Addendum Note (Signed)
Addended by: Osa Craver on: 06/22/2016 10:28 AM   Modules accepted: Orders

## 2016-06-22 NOTE — Assessment & Plan Note (Signed)
Try to go back to prednisone 10mg  daily  follow up 6 weeks

## 2016-06-22 NOTE — Assessment & Plan Note (Signed)
Need to get started back on CPAP ASAP Order to [facility]  Patient Instructions  Decrease prednisone 10mg  daily .  Remain on Lasix 40mg  Twice daily  .  Low salt diet  Legs elevated  Remains on Oxygen 2l/m .  Restart CPAP At bedtime  .  Order to [facility] for CPAP repair ASAP  follow up Dr. Elsworth Soho  In 6 -8 weeks and As needed

## 2016-06-22 NOTE — Progress Notes (Signed)
Subjective:    Patient ID: [name], female    DOB: October 31, 1966, 50 y.o.   MRN: [id number]  HPI [name]   50 yo morbidly obese woman with OSA & BL infiltrates & hilar and mediastinal lymphadenopathy , presumed sarcoid (although TBBX neg ) with skin & joint involvement  Significant tests/ events:  First seen in hospital 01/26/11  for acute dyspnea and hypoxia found to have OHS and OSA with recs for continuous O2 ( 2 L/m at rest & 4L/m on walking)and Nocturnal CPAP  Echo- mild LVH , EF 55-60% . VQ scan intermed prob , doppler neg. CT chest  neg for PE.  CPAP titration 03/2011 (wt 479 lbs) -CPAP 7 cm to stop snoring, No desaturation on O2 2 L/min   09/2011 - Hosp adm for hemoptysis & BL infiltrates on CT,p-anca pos 1.80, gbm neg, ANA neg, ESR 42  Labs c/w fe def anemia, UA pos blood (was having periods) >> rpt UA no RBCs  Underwent endometrial ablation for menorrhagia -unsuccessful  08/2012 admitted for atypical chest pain, and shortness of breath.  CT chest was negative for PE, notable, hilar and mediastinal lymphadenopathy and cardiomegaly  C-ANCA 1:40, ANA 1:320 , homogenous  ACE level 21   10/02/2012 TBBX >> no granulomas, BAL neg afb, fungal  12/2012 -Was seen by Dr. [name] at [facility]. repeat labs>> neg pANCA and cANCA ,ESR 48 and neg ANA , low ACE level    CT chest 12/2013 >decreased hilar /mediastinal adenopathy-(done 1 week after steroids started ) >>>Pred decreased to 34m daily   CT chest 04/24/14 - Scattered pulmonary opacities with ground-glass densities, clustered reticulonodular densities and interstitial thickening  06/12/14  Patient reports previous biopsy of, arm, rash, by dermatology was consistent with sun exposure dermatitis.   10/2014 PFTs-FVC 55%, DLCO 44%   03/09/15 increased joint pain and chest tightness - on prednisone 118mevery other day - Increased to 1040maily , today she took 20 mg because she was having severe  joint pains in the morning  Adm 12/2014 for acute resp failure secondary to CHF and PNA superimposed upon underlying OSA/OHS, sarcoidosis, and anemia -required 2 units PRBC for hemoglobin 6.7 CT angiogram chest negative for pulmonary embolus-Revealed scattered  opacities bilateral  She has been on and off prednisone since 12/2013-started for skin rash and joint pains  -more compliant with O2  >steroid burst and rhuematology referral .   Labs showed ANA remains positive with titer 1:1280.  Has seen Rheumatology in past (~2103) for possible Sarcoid/pulmonary infiltrates  and positive ANA /c-ANCA  referred back to rheumatology and has office visit in July 2016    06/22/2016 Follow up : Presumed Sarcoid /OSA /OHS  Returns for 4  month  follow up .  Pt has sarcoid and polyarthriits (?seronegative RA vs Sarcoid joint involvement .  Rheumatology started her on Methotrexate  Joint pain is better. On chronic prednisone 48m62mily . Increased last week during hospital stay. Now on prednisone 20mg69mly. No flare of cough .    On  CPAP At bedtime  .Says her machine is broken, humidifier compartment melted and machine will not turn on. Off for 10 days.  Remains on O2 2l/m act  and At bedtime w/ CPAP  Patient denies any hemoptysis, chest pain, orthopnea, PND,. Wt is up 8 lbs , now 501lbs.   Lasix was increased in hospital to 60mg 56me daily  . Says urination went down so she changed  to 27m Twice daily  And that helped. Legs swelling is about her normal.  Breathing is improving but still get winded with minimal activity.    Recently admitted for symptomatic anemia r/t excessive menstruation. Required 2 u PRBC.  Has been referred to Oncology and Hematology .   Son had gastric sleeve in Nov and has lost 200lbs .   Past Medical History:  Diagnosis Date  . Anemia   . Arthritis    hands, shoulders, no meds  . CHF (congestive heart failure) (HCC)    EF60-65%  . Chronic hyperventilation  syndrome    w/ obesity tx with albuterol inhaler and oxygen 2L  . COPD [place] disease) [place]    uses oxygen 2 L  . Gallstones   . GERD (gastroesophageal reflux disease)    diet controlled - no meds  . H/O hiatal hernia   . Hypertension   . Hypothyroidism   . Kidney stones   . [place]bid [place] ([place]   . Pneumonia    hoispitalized in 08/2011  . [place]   . Seasonal allergies   . Shortness of breath   . Sleep apnea    uses CPAP machine    Current Outpatient Prescriptions on File Prior to Visit  Medication Sig Dispense Refill  . acetaminophen (TYLENOL) 500 MG tablet Take 500-1,000 mg by mouth every 6 (six) hours as needed for moderate pain.    .Marland Kitchenalbuterol (PROVENTIL HFA;VENTOLIN HFA) 108 (90 Base) MCG/ACT inhaler Inhale 1-2 puffs into the lungs every 6 (six) hours as needed for wheezing. 1 Inhaler 0  . cetirizine (ZYRTEC) 10 MG tablet Take 10 mg by mouth at bedtime as needed for allergies.    . cholecalciferol (VITAMIN D) 400 UNITS TABS tablet Take 400 Units by mouth daily.    . Coral Calcium 1000 (390 CA) MG TABS Take 1 tablet by mouth daily.    . ferrous sulfate 325 (65 FE) MG tablet Take 1 tablet (325 mg total) by mouth 3 (three) times daily with meals. 2270tablet 11  . folic acid (FOLVITE) 1 MG tablet Take 1 mg by mouth daily.  3  . furosemide (LASIX) 20 MG tablet Take 3 tablets (60 mg total) by mouth 2 (two) times daily. (Patient taking differently: Take 20 mg by mouth 2 (two) times daily. ) 60 tablet 0  . levothyroxine (SYNTHROID, LEVOTHROID) 100 MCG tablet Take 1 tablet (100 mcg total) by mouth daily at 6 (six) AM. 30 tablet 4  . losartan (COZAAR) 50 MG tablet TAKE 1 TABLET (50 MG TOTAL) BY MOUTH DAILY. 30 tablet 5  . methotrexate 50 MG/2ML injection Inject 50 mg into the skin every Friday.  3  . metoprolol succinate (TOPROL-XL) 25 MG 24 hr tablet TAKE 1 TABLET (25 MG TOTAL) BY MOUTH DAILY. 30 tablet 5  . NON FORMULARY 2 liter of oxygen    .  triamcinolone acetonide (KENALOG) 40 MG/ML injection Inject 40 mg into the muscle every 3 (three) months. For knee pain    . triamcinolone ointment (KENALOG) 0.1 % Apply 1 application topically 2 (two) times daily. Once or twice weekly as needed for break out of Sarcoidosis    . vitamin B-12 1000 MCG tablet Take 1 tablet (1,000 mcg total) by mouth daily. 30 tablet 0   No current facility-administered medications on file prior to visit.      Review of Systems neg for any significant sore throat, dysphagia, itching, sneezing, nasal congestion or excess/ purulent secretions,  fever, chills, sweats, unintended wt loss, pleuritic or exertional cp, hempoptysis, orthopnea pnd or change in chronic leg swelling. Also denies presyncope, palpitations, heartburn, abdominal pain, nausea, vomiting, diarrhea or change in bowel or urinary habits, dysuria,hematuria, rash, arthralgias, visual complaints, headache, numbness weakness or ataxia.     Objective:   Physical Exam Vitals:   06/22/16 0922  BP: 132/74  Pulse: 92  Temp: 98.7 F (37.1 C)  TempSrc: Oral  SpO2: 95%  Weight: (!) 501 lb (227.3 kg)  Height: 5' 6"  (1.676 m)    Gen. Pleasant, morbidly obese, in no distress, normal affect ENT - no lesions, no post nasal drip, class 2-3 airway Neck: No JVD, no thyromegaly, no carotid bruits Lungs: no use of accessory muscles, no dullness to percussion, decreased without rales or rhonchi  Cardiovascular: Rhythm regular, heart sounds  normal, no murmurs or gallops, chronic 1- + peripheral edema VI changes  Abdomen: soft and non-tender, no hepatosplenomegaly, BS normal. Morbidly obese Musculoskeletal: No deformities, no cyanosis or clubbing Neuro:  alert, non focal, no tremors  Delrae Hagey NP-C  [facility]  06/22/2016

## 2016-06-22 NOTE — Assessment & Plan Note (Signed)
Does not appear fluid overloaded.  Cont on lasix 40mg  Twice daily    Plan  Patient Instructions  Decrease prednisone 10mg  daily .  Remain on Lasix 40mg  Twice daily  .  Low salt diet  Legs elevated  Remains on Oxygen 2l/m .  Restart CPAP At bedtime  .  Order to [facility] for CPAP repair ASAP  follow up Dr. Elsworth Soho  In 6 -8 weeks and As needed

## 2016-06-22 NOTE — Patient Instructions (Addendum)
Decrease prednisone 10mg  daily .  Remain on Lasix 40mg  Twice daily  .  Low salt diet  Legs elevated  Remains on Oxygen 2l/m .  Restart CPAP At bedtime  .  Order to [facility] for CPAP repair ASAP  follow up Dr. Elsworth Soho  In 6 -8 weeks and As needed

## 2016-06-23 ENCOUNTER — Encounter: Payer: Self-pay | Admitting: Hematology

## 2016-06-23 ENCOUNTER — Ambulatory Visit (HOSPITAL_BASED_OUTPATIENT_CLINIC_OR_DEPARTMENT_OTHER): Payer: Medicare Other | Admitting: Hematology

## 2016-06-23 ENCOUNTER — Telehealth: Payer: Self-pay | Admitting: Adult Health

## 2016-06-23 VITALS — BP 158/85 | HR 114 | Temp 99.1°F | Resp 18 | Ht 66.0 in | Wt >= 6400 oz

## 2016-06-23 DIAGNOSIS — G4733 Obstructive sleep apnea (adult) (pediatric): Secondary | ICD-10-CM

## 2016-06-23 DIAGNOSIS — D500 Iron deficiency anemia secondary to blood loss (chronic): Secondary | ICD-10-CM

## 2016-06-23 DIAGNOSIS — I10 Essential (primary) hypertension: Secondary | ICD-10-CM

## 2016-06-23 NOTE — Telephone Encounter (Signed)
TP is out of the office. Pt is needing an order for cpap replacement. Need settings on the order. I do not see any CPAP settings in last note. Please advise Dr. Elsworth Soho thanks

## 2016-06-23 NOTE — Progress Notes (Signed)
[place]  [phone] [phone] [place] consult Note   Patient Care Team: [name], NP as PCP - General (Family Medicine) Wonda Horner, MD (Gastroenterology) Rigoberto Noel, MD as Consulting Physician (Pulmonary Disease) 06/23/2016  CHIEF COMPLAINTS/PURPOSE OF CONSULTATION:  Anemia   HISTORY OF PRESENTING ILLNESS:  [name] 50 y.o. female with past medical history of sarcoidosis, on chronic continuous oxygen 2 L/min, morbid obesity, congestive heart failure, here because of her anemia. She was referred by her PCP.   She has had anemia since she was in her 45's. She has very heavy menstrual period, the last about 7 days, she has to change a pad 5-6 times a day, and she contributes her anemia to her heavy period. She underwent endometrial ablation once in the past, which did not work. She would like to have a hysterectomy, which has not been offered by her gynecologist. She has been taking iron pill one or twice a day, but not very compliant. Her anemia has been getting worse lately, she was admitted to hospital twice for severe anemia in the past year, and received blood transfusion and IV feraheme in 12/2014 and 06/17/2016. She has been following with her PCP for her anemia.   She denies recent chest pain on exertion, shortness of breath on minimal exertion, pre-syncopal episodes, or palpitations. She had not noticed any other recent bleeding such as epistaxis, hematuria or hematochezia The patient denies over the counter NSAID ingestion. She is not on antiplatelets agents. She had no prior history or diagnosis of cancer. Her age appropriate screening programs are up-to-date. She denies any pica and eats a variety of diet. She never donated blood or received blood transfusion  MEDICAL HISTORY:  Past Medical History:  Diagnosis Date  . Anemia   . Arthritis    hands, shoulders, no meds  . CHF (congestive heart failure) (HCC)      EF60-65%  . Chronic hyperventilation syndrome    w/ obesity tx with albuterol inhaler and oxygen 2L  . COPD (chronic obstructive pulmonary disease) ([place])    uses oxygen 2 L  . Gallstones   . GERD (gastroesophageal reflux disease)    diet controlled - no meds  . H/O hiatal hernia   . Hypertension   . Hypothyroidism   . Kidney stones   . Morbid obesity ([place])   . Pneumonia    hoispitalized in 08/2011  . Sarcoidosis ([place])   . Seasonal allergies   . Shortness of breath   . Sleep apnea    uses CPAP machine     SURGICAL HISTORY: Past Surgical History:  Procedure Laterality Date  . [place]   x 1  . CHOLECYSTECTOMY  2000  . HYSTEROSCOPY W/D&C  12/27/2011   Procedure: DILATATION AND CURETTAGE /HYSTEROSCOPY;  Surgeon: Maeola Sarah. Landry Mellow, MD;  Location: [facility];  Service: Gynecology;;  . I and D of abcess  05/2011  . LUNG BIOPSY    . uterine abletion      SOCIAL HISTORY: Social History   Social History  . Marital status: Single    Spouse name: N/A  . Number of children: N/A  . Years of education: N/A   Occupational History  . unemployeed    Social History Main Topics  . Smoking status: Never Smoker  . Smokeless tobacco: Never Used  . Alcohol use Yes     Comment: occasionally  . Drug use: No  . Sexual activity: No  Other Topics Concern  . Not on file   Social History Narrative   Is not currently working.    Divorced for eight or nine years   Has one son who lives around here.        FAMILY HISTORY: Family History  Problem Relation Age of Onset  . Diabetes Father   . Cancer Father 15    colon cancer   . Diabetes Brother   . Deep vein thrombosis Mother   . Aneurysm Sister     d/o brain aneurysm    ALLERGIES:  has No Known Allergies.  MEDICATIONS:  Current Outpatient Prescriptions  Medication Sig Dispense Refill  . acetaminophen (TYLENOL) 500 MG tablet Take 500-1,000 mg by mouth every 6 (six) hours as needed for moderate pain.    Marland Kitchen  albuterol (PROVENTIL HFA;VENTOLIN HFA) 108 (90 Base) MCG/ACT inhaler Inhale 1-2 puffs into the lungs every 6 (six) hours as needed for wheezing. 1 Inhaler 0  . cetirizine (ZYRTEC) 10 MG tablet Take 10 mg by mouth at bedtime as needed for allergies.    . cholecalciferol (VITAMIN D) 400 UNITS TABS tablet Take 400 Units by mouth daily.    . [place] Calcium 1000 (390 CA) MG TABS Take 1 tablet by mouth daily.    . ferrous sulfate 325 (65 FE) MG tablet Take 1 tablet (325 mg total) by mouth 3 (three) times daily with meals. [id number] tablet 11  . folic acid (FOLVITE) 1 MG tablet Take 1 mg by mouth daily.  3  . furosemide (LASIX) 20 MG tablet Take 3 tablets (60 mg total) by mouth 2 (two) times daily. (Patient taking differently: Take 20 mg by mouth 2 (two) times daily. ) 60 tablet 0  . levothyroxine (SYNTHROID, LEVOTHROID) 100 MCG tablet Take 1 tablet (100 mcg total) by mouth daily at 6 (six) AM. 30 tablet 4  . losartan (COZAAR) 50 MG tablet TAKE 1 TABLET (50 MG TOTAL) BY MOUTH DAILY. 30 tablet 5  . methotrexate 50 MG/2ML injection Inject 50 mg into the skin every Friday.  3  . metoprolol succinate (TOPROL-XL) 25 MG 24 hr tablet TAKE 1 TABLET (25 MG TOTAL) BY MOUTH DAILY. 30 tablet 5  . NON FORMULARY 2 liter of oxygen    . triamcinolone acetonide (KENALOG) 40 MG/ML injection Inject 40 mg into the muscle every 3 (three) months. For knee pain    . triamcinolone ointment (KENALOG) 0.1 % Apply 1 application topically 2 (two) times daily. Once or twice weekly as needed for break out of Sarcoidosis    . vitamin B-12 1000 MCG tablet Take 1 tablet (1,000 mcg total) by mouth daily. 30 tablet 0   No current facility-administered medications for this visit.     REVIEW OF SYSTEMS:   Constitutional: Denies fevers, chills or abnormal night sweats Eyes: Denies blurriness of vision, double vision or watery eyes Ears, nose, mouth, throat, and face: Denies mucositis or sore throat Respiratory: Denies cough, dyspnea or  wheezes Cardiovascular: Denies palpitation, chest discomfort or lower extremity swelling Gastrointestinal:  Denies nausea, heartburn or change in bowel habits Skin: Denies abnormal skin rashes Lymphatics: Denies new lymphadenopathy or easy bruising Neurological:Denies numbness, tingling or new weaknesses Behavioral/Psych: Mood is stable, no new changes  All other systems were reviewed with the patient and are negative.  PHYSICAL EXAMINATION: ECOG PERFORMANCE STATUS: 1 - Symptomatic but completely ambulatory  Vitals:   06/23/16 1458  BP: (!) 158/85  Pulse: (!) 114  Resp: 18  Temp: 99.1 F (  37.3 C)   Filed Weights   06/23/16 1458  Weight: (!) 481 lb 14.4 oz (218.6 kg)    GENERAL:alert, no distress and comfortable SKIN: skin color, texture, turgor are normal, no rashes or significant lesions EYES: normal, conjunctiva are pink and non-injected, sclera clear OROPHARYNX:no exudate, no erythema and lips, buccal mucosa, and tongue normal  NECK: supple, thyroid normal size, non-tender, without nodularity LYMPH:  no palpable lymphadenopathy in the cervical, axillary or inguinal LUNGS: clear to auscultation and percussion with normal breathing effort HEART: regular rate & rhythm and no murmurs and no lower extremity edema ABDOMEN:abdomen soft, non-tender and normal bowel sounds Musculoskeletal:no cyanosis of digits and no clubbing  PSYCH: alert & oriented x 3 with fluent speech NEURO: no focal motor/sensory deficits  LABORATORY DATA:  I have reviewed the data as listed CBC Latest Ref Rng & Units 06/21/2016 06/18/2016 06/17/2016  WBC 4.0 - 10.5 K/uL 10.8(H) 10.9(H) 11.7(H)  Hemoglobin 12.0 - 15.0 g/dL 9.6(L) 8.1(L) 8.2(L)  Hematocrit 36.0 - 46.0 % 33.2(L) 31.0(L) 31.1(L)  Platelets 150.0 - 400.0 K/uL 250.0 267 234    CMP Latest Ref Rng & Units 06/21/2016 06/19/2016 06/18/2016  Glucose 70 - 99 mg/dL 108(H) 122(H) 137(H)  BUN 6 - 23 mg/dL 12 13 14   Creatinine 0.40 - 1.20 mg/dL 0.69 0.59  0.59  Sodium 135 - 145 mEq/L 141 140 139  Potassium 3.5 - 5.1 mEq/L 4.1 4.3 4.0  Chloride 96 - 112 mEq/L 98 100(L) 98(L)  CO2 19 - 32 mEq/L 35(H) 32 34(H)  Calcium 8.4 - 10.5 mg/dL 8.9 8.4(L) 8.5(L)  Total Protein 6.5 - 8.1 g/dL - - -  Total Bilirubin 0.3 - 1.2 mg/dL - - -  Alkaline Phos 38 - 126 U/L - - -  AST 15 - 41 U/L - - -  ALT 14 - 54 U/L - - -     RADIOGRAPHIC STUDIES: I have personally reviewed the radiological images as listed and agreed with the findings in the report. Dg Chest 2 View  Result Date: 06/15/2016 CLINICAL DATA:  Acute onset of worsening shortness of breath. Initial encounter. EXAM: CHEST  2 VIEW COMPARISON:  Chest radiograph performed 02/26/2016 FINDINGS: Vascular congestion is noted. Increased interstitial markings raise concern for pulmonary edema. No pleural effusion or pneumothorax is seen. The cardiomediastinal silhouette is borderline enlarged. No acute osseous abnormalities are identified. IMPRESSION: Vascular congestion and borderline cardiomegaly. Increased interstitial markings raise concern for pulmonary edema. Electronically Signed   By: Garald Balding M.D.   On: 06/15/2016 23:43    ASSESSMENT & PLAN:  50 year old premenopause female, presented with chronic anemia, heavy menstrual periods, and worsening anemia lately, and required blood hospitalization and transfusions.  1. Iron deficient anemia from chronic blood loss -His multiple iron study has showed very low level of ferritin, serum iron level and transferrin saturation, her MCV is low, consistent with iron deficient anemia. -Her iron deficiency is likely from her menorrhagia -She is not very compliant with oral iron supplement, and only received 2 doses of IV Feraheme in the past year, last dose one week ago, I recommend him to have a second dose next week in our cancer center. -I recommend her to check her CBC and ferritin every 4 weeks, consider IV Feraheme if ferritin less than 50 -If her  anemia does not resolve after IV Feraheme, I may consider other anemia workup.  2. Hypertension, pulmonary sarcomatosis, congestive heart failure morbid obesity -She'll continue follow-up with her primary care physician and other  doctors   Plan  -IV Feraheme next week -Repeat CBC and ferritin every 4 weeks. She will call us the day after her lab work, to see if she needs IV Feraheme (if ferritin<50) or blood (if Hb<8.0)  -I'll see her back in 9 weeks with a feraheme infusion appointment   All questions were answered. The patient knows to call the clinic with any problems, questions or concerns. I spent 40 minutes counseling the patient face to face. The total time spent in the appointment was 55 minutes and more than 50% was on counseling.     Truitt Merle, MD 06/23/16 5:18 PM

## 2016-06-24 NOTE — Telephone Encounter (Signed)
CPAP 7 cm

## 2016-06-24 NOTE — Telephone Encounter (Signed)
[email or web address] for jason from [facility] to ask about order for cpap.

## 2016-06-24 NOTE — Telephone Encounter (Signed)
Called spoke with [place] at [facility]. He states that the patient is actually due for a new CPAP and is requesting a new order to be placed. I explained to him that the order will be placed today. He voiced understanding and had no further questions. Order has been placed. Nothing further needed.

## 2016-06-29 ENCOUNTER — Telehealth: Payer: Self-pay | Admitting: Hematology

## 2016-06-29 NOTE — Telephone Encounter (Signed)
appt made per 8/24 LOS

## 2016-06-30 ENCOUNTER — Other Ambulatory Visit: Payer: Self-pay | Admitting: Adult Health

## 2016-06-30 ENCOUNTER — Telehealth: Payer: Self-pay | Admitting: Adult Health

## 2016-06-30 DIAGNOSIS — N939 Abnormal uterine and vaginal bleeding, unspecified: Secondary | ICD-10-CM

## 2016-06-30 NOTE — Telephone Encounter (Signed)
Please advise 

## 2016-06-30 NOTE — Telephone Encounter (Signed)
Pt states she needs a new referral to an oncologist that deals with gynecology issues. (heavy periods)  Pt does not want to go back to dr Polly Cobia, because there office is in [facility] and does not even have a wheelchair that will accommodate her. Pt states she has not seen Dr Polly Cobia if over 4 years. Pt was there today and does not want to be seen by them.

## 2016-07-01 ENCOUNTER — Ambulatory Visit (HOSPITAL_BASED_OUTPATIENT_CLINIC_OR_DEPARTMENT_OTHER): Payer: Medicare Other

## 2016-07-01 DIAGNOSIS — D500 Iron deficiency anemia secondary to blood loss (chronic): Secondary | ICD-10-CM | POA: Diagnosis present

## 2016-07-01 MED ORDER — FERUMOXYTOL INJECTION 510 MG/17 ML
510.0000 mg | Freq: Once | INTRAVENOUS | Status: AC
  Administered 2016-07-01: 510 mg via INTRAVENOUS
  Filled 2016-07-01: qty 17

## 2016-07-01 MED ORDER — SODIUM CHLORIDE 0.9 % IV SOLN
Freq: Once | INTRAVENOUS | Status: AC
  Administered 2016-07-01: 09:00:00 via INTRAVENOUS

## 2016-07-01 NOTE — Patient Instructions (Signed)

## 2016-07-08 ENCOUNTER — Ambulatory Visit: Payer: Medicare Other | Admitting: Obstetrics & Gynecology

## 2016-07-08 ENCOUNTER — Telehealth: Payer: Self-pay | Admitting: Adult Health

## 2016-07-08 NOTE — Telephone Encounter (Signed)
PT no longer want to see dr Polly Cobia. Pt would like to see dr Marko Plume at [facility] due to abnormal cells on   papsmears

## 2016-07-11 NOTE — Telephone Encounter (Signed)
Left message notifying patient.

## 2016-07-11 NOTE — Telephone Encounter (Signed)
She has been referred to [place] [facility]

## 2016-07-19 ENCOUNTER — Ambulatory Visit: Payer: Medicare Other | Admitting: Adult Health

## 2016-07-21 ENCOUNTER — Other Ambulatory Visit (HOSPITAL_BASED_OUTPATIENT_CLINIC_OR_DEPARTMENT_OTHER): Payer: Medicare Other

## 2016-07-21 DIAGNOSIS — D500 Iron deficiency anemia secondary to blood loss (chronic): Secondary | ICD-10-CM

## 2016-07-21 LAB — CBC & DIFF AND RETIC
BASO%: 0.5 % (ref 0.0–2.0)
Basophils Absolute: 0 10*3/uL (ref 0.0–0.1)
EOS%: 0.1 % (ref 0.0–7.0)
Eosinophils Absolute: 0 10*3/uL (ref 0.0–0.5)
HCT: 37.8 % (ref 34.8–46.6)
HGB: 11.5 g/dL — ABNORMAL LOW (ref 11.6–15.9)
IMMATURE RETIC FRACT: 11.2 % — AB (ref 1.60–10.00)
LYMPH%: 6.6 % — AB (ref 14.0–49.7)
MCH: 23.4 pg — AB (ref 25.1–34.0)
MCHC: 30.4 g/dL — AB (ref 31.5–36.0)
MCV: 77 fL — AB (ref 79.5–101.0)
MONO#: 0.5 10*3/uL (ref 0.1–0.9)
MONO%: 6.6 % (ref 0.0–14.0)
NEUT%: 86.2 % — ABNORMAL HIGH (ref 38.4–76.8)
NEUTROS ABS: 6.9 10*3/uL — AB (ref 1.5–6.5)
Platelets: 253 10*3/uL (ref 145–400)
RBC: 4.91 10*6/uL (ref 3.70–5.45)
RDW: 32.9 % — ABNORMAL HIGH (ref 11.2–14.5)
Retic %: 0.82 % (ref 0.70–2.10)
Retic Ct Abs: 40.26 10*3/uL (ref 33.70–90.70)
WBC: 8 10*3/uL (ref 3.9–10.3)
lymph#: 0.5 10*3/uL — ABNORMAL LOW (ref 0.9–3.3)

## 2016-07-21 LAB — TECHNOLOGIST REVIEW

## 2016-07-21 LAB — IRON AND TIBC
%SAT: 13 % — ABNORMAL LOW (ref 21–57)
Iron: 35 ug/dL — ABNORMAL LOW (ref 41–142)
TIBC: 263 ug/dL (ref 236–444)
UIBC: 228 ug/dL (ref 120–384)

## 2016-07-21 LAB — FERRITIN: FERRITIN: 87 ng/mL (ref 9–269)

## 2016-07-27 ENCOUNTER — Encounter: Payer: Self-pay | Admitting: Gynecologic Oncology

## 2016-07-27 ENCOUNTER — Ambulatory Visit: Payer: Medicare Other | Attending: Gynecologic Oncology | Admitting: Gynecologic Oncology

## 2016-07-27 ENCOUNTER — Telehealth: Payer: Self-pay

## 2016-07-27 VITALS — BP 129/76 | HR 89 | Temp 98.4°F | Resp 18 | Wt >= 6400 oz

## 2016-07-27 DIAGNOSIS — E039 Hypothyroidism, unspecified: Secondary | ICD-10-CM | POA: Diagnosis not present

## 2016-07-27 DIAGNOSIS — F458 Other somatoform disorders: Secondary | ICD-10-CM | POA: Diagnosis not present

## 2016-07-27 DIAGNOSIS — K219 Gastro-esophageal reflux disease without esophagitis: Secondary | ICD-10-CM | POA: Diagnosis not present

## 2016-07-27 DIAGNOSIS — D869 Sarcoidosis, unspecified: Secondary | ICD-10-CM | POA: Diagnosis not present

## 2016-07-27 DIAGNOSIS — Z9049 Acquired absence of other specified parts of digestive tract: Secondary | ICD-10-CM | POA: Diagnosis not present

## 2016-07-27 DIAGNOSIS — E662 Morbid (severe) obesity with alveolar hypoventilation: Secondary | ICD-10-CM | POA: Diagnosis not present

## 2016-07-27 DIAGNOSIS — Z9889 Other specified postprocedural states: Secondary | ICD-10-CM | POA: Insufficient documentation

## 2016-07-27 DIAGNOSIS — Z87442 Personal history of urinary calculi: Secondary | ICD-10-CM | POA: Insufficient documentation

## 2016-07-27 DIAGNOSIS — I509 Heart failure, unspecified: Secondary | ICD-10-CM | POA: Diagnosis not present

## 2016-07-27 DIAGNOSIS — G4733 Obstructive sleep apnea (adult) (pediatric): Secondary | ICD-10-CM | POA: Insufficient documentation

## 2016-07-27 DIAGNOSIS — J449 Chronic obstructive pulmonary disease, unspecified: Secondary | ICD-10-CM | POA: Insufficient documentation

## 2016-07-27 DIAGNOSIS — I110 Hypertensive heart disease with heart failure: Secondary | ICD-10-CM | POA: Diagnosis not present

## 2016-07-27 DIAGNOSIS — N938 Other specified abnormal uterine and vaginal bleeding: Secondary | ICD-10-CM | POA: Diagnosis not present

## 2016-07-27 DIAGNOSIS — R0602 Shortness of breath: Secondary | ICD-10-CM | POA: Insufficient documentation

## 2016-07-27 DIAGNOSIS — N939 Abnormal uterine and vaginal bleeding, unspecified: Secondary | ICD-10-CM

## 2016-07-27 MED ORDER — LEVONORGESTREL 20 MCG/24HR IU IUD: 1.0000 | INTRAUTERINE_SYSTEM | Freq: Once | INTRAUTERINE | 0 refills | Status: DC

## 2016-07-27 MED FILL — MIRENA SYSTEM: 20 | 1 days supply | Qty: 1 | Fill #0

## 2016-07-27 NOTE — Telephone Encounter (Signed)
Patient contacted and updated with date and time of surgery with Dr Everitt Amber , October 10 , 2017 at 08:45.  Patient states she has been contacted already with a date and time for her Pre-Op appointmentThe patient was also updated with her co-pay of $3.00 for the IUD required for surgery and that she needs to go to [place]. Patient states understanding , denies further questions at this time.

## 2016-07-27 NOTE — Progress Notes (Signed)
GYNECOLOGIC ONCOLOGY NEW PATIENT CONSULTATION  Date of Service: 07/27/16 Referring Provider: Christophe Louis, MD Consulting Provider: Marcello Fennel. Carlis Abbott, MD  HISTORY OF PRESENT ILLNESS: [name] is a 50 y.o. woman who is seen in consultation at the request of Dr. Landry Mellow for evaluation of AUB in the setting of morbid obesity (BMI 80) and multiple medical comorbidities.  Ms. Voller has super morbid obesity resulting in chronic hyperventilation syndrome, OSA, GERD, and CHF. Her last EF was 60%. She requires 2L of oxygen at baseline for dyspnea and desaturations with walking. She also has a history of sarcoidosis. She has had abnormal uterine bleeding since her 67s. She has previously had a D&C with predominantly endocervical glands in 2013 and an endometrial ablation in 2013. She reports that this "did not take" and she continued to bleed since that time. She only bleeds with her menstrual cycles which can be very heavy. She has required transfusion x2 (April and August) and IV iron for her bleeding. She is non-compliant with PO iron. She strongly desires hysterectomy. She reports her most recent period was considerably lighter than prior. Her mother and sisters all went through menopause in their 20s. She denies dysplasia. Her last pap was 2013.   PAST MEDICAL HISTORY: Past Medical History:  Diagnosis Date  . Anemia   . Arthritis    hands, shoulders, no meds  . CHF (congestive heart failure) (HCC)    EF60-65%  . Chronic hyperventilation syndrome    w/ obesity tx with albuterol inhaler and oxygen 2L  . COPD (chronic obstructive pulmonary disease) ([place])    uses oxygen 2 L  . Gallstones   . GERD (gastroesophageal reflux disease)    diet controlled - no meds  . H/O hiatal hernia   . Hypertension   . Hypothyroidism   . Kidney stones   . Morbid obesity ([place])   . Pneumonia    hoispitalized in 08/2011  . Sarcoidosis ([place])   . Seasonal allergies   . Shortness of breath   . Sleep apnea    uses CPAP machine     PAST SURGICAL HISTORY: Past Surgical History:  Procedure Laterality Date  . [place]   x 1  . CHOLECYSTECTOMY  2000  . HYSTEROSCOPY W/D&C  12/27/2011   Procedure: DILATATION AND CURETTAGE /HYSTEROSCOPY;  Surgeon: Maeola Sarah. Landry Mellow, MD;  Location: [facility];  Service: Gynecology;;  . I and D of abcess  05/2011  . LUNG BIOPSY    . uterine abletion      OB/GYN HISTORY: G1P1, CS x1 (son) Age at menopause: n/a Hx of HRT: denies Hx of STDs: denies Last pap: 2013 History of abnormal pap smears: denies  SCREENING STUDIES:  Last colonoscopy: never  MEDICATIONS:  Current Outpatient Prescriptions:  .  acetaminophen (TYLENOL) 500 MG tablet, Take 500-1,000 mg by mouth every 6 (six) hours as needed for moderate pain., Disp: , Rfl:  .  albuterol (PROVENTIL HFA;VENTOLIN HFA) 108 (90 Base) MCG/ACT inhaler, Inhale 1-2 puffs into the lungs every 6 (six) hours as needed for wheezing., Disp: 1 Inhaler, Rfl: 0 .  cetirizine (ZYRTEC) 10 MG tablet, Take 10 mg by mouth at bedtime as needed for allergies., Disp: , Rfl:  .  cholecalciferol (VITAMIN D) 400 UNITS TABS tablet, Take 400 Units by mouth daily., Disp: , Rfl:  .  Coral Calcium 1000 (390 CA) MG TABS, Take 1 tablet by mouth daily., Disp: , Rfl:  .  ferrous sulfate 325 (65 FE) MG tablet,  Take 1 tablet (325 mg total) by mouth 3 (three) times daily with meals., Disp: 270 tablet, Rfl: 11 .  folic acid (FOLVITE) 1 MG tablet, Take 1 mg by mouth daily., Disp: , Rfl: 3 .  furosemide (LASIX) 20 MG tablet, Take 3 tablets (60 mg total) by mouth 2 (two) times daily. (Patient taking differently: Take 20 mg by mouth 2 (two) times daily. ), Disp: 60 tablet, Rfl: 0 .  levothyroxine (SYNTHROID, LEVOTHROID) 100 MCG tablet, Take 1 tablet (100 mcg total) by mouth daily at 6 (six) AM., Disp: 30 tablet, Rfl: 4 .  losartan (COZAAR) 50 MG tablet, TAKE 1 TABLET (50 MG TOTAL) BY MOUTH DAILY., Disp: 30 tablet, Rfl: 5 .  methotrexate 50 MG/2ML  injection, Inject 50 mg into the skin every Friday., Disp: , Rfl: 3 .  metoprolol succinate (TOPROL-XL) 25 MG 24 hr tablet, TAKE 1 TABLET (25 MG TOTAL) BY MOUTH DAILY., Disp: 30 tablet, Rfl: 5 .  NON FORMULARY, 2 liter of oxygen, Disp: , Rfl:  .  triamcinolone acetonide (KENALOG) 40 MG/ML injection, Inject 40 mg into the muscle every 3 (three) months. For knee pain, Disp: , Rfl:  .  triamcinolone ointment (KENALOG) 0.1 %, Apply 1 application topically 2 (two) times daily. Once or twice weekly as needed for break out of Sarcoidosis, Disp: , Rfl:  .  vitamin B-12 1000 MCG tablet, Take 1 tablet (1,000 mcg total) by mouth daily., Disp: 30 tablet, Rfl: 0  ALLERGIES: No Known Allergies  FAMILY HISTORY: Family History  Problem Relation Age of Onset  . Diabetes Father   . Cancer Father 67    colon cancer   . Diabetes Brother   . Deep vein thrombosis Mother   . Aneurysm Sister     d/o brain aneurysm    SOCIAL HISTORY: Social History   Social History  . Marital status: Single    Spouse name: N/A  . Number of children: N/A  . Years of education: N/A   Occupational History  . unemployeed    Social History Main Topics  . Smoking status: Never Smoker  . Smokeless tobacco: Never Used  . Alcohol use Yes     Comment: occasionally  . Drug use: No  . Sexual activity: No   Other Topics Concern  . Not on file   Social History Narrative   Is not currently working.    Divorced for eight or nine years   Has one son who lives around here.        REVIEW OF SYSTEMS: Complete 10-system review is negative except for the following: as per HPI.  PHYSICAL EXAM: BP 129/76 (BP Location: Left Arm, Patient Position: Sitting) Comment: right wrist  Pulse 89   Temp 98.4 F (36.9 C) (Oral)   Resp 18   Wt (!) 498 lb (225.9 kg)   SpO2 100%   BMI 80.38 kg/m  General: Alert, oriented, no acute distress. HEENT: Normocephalic, atraumatic.  Sclera anicteric, posterior oropharynx clear.  Normal  dentition. Chest: Clear to auscultation bilaterally. Cardiovascular: Regular rate and rhythm, no murmurs, rubs, or gallops. Abdomen: Obese. Normoactive bowel sounds.  Soft, nondistended, nontender to palpation.  No masses or hepatosplenomegaly appreciated.  No evidence of hernia.  No palpable fluid wave.   Extremities: Grossly normal range of motion.  Warm, well perfused.   Skin: No rashes or lesions. GU: External genitalia without lesions.  On speculum exam, cervix unable to be visualized due to obesity and retraction behind the symphysis. Attempted  endometrial biopsy blindly but unable to pass pipelle through internal os.  Bimanual exam reveals small cervix tucked behind the symphysis without ability to palpate organs due to habitus.   LABORATORY AND RADIOLOGIC DATA: Outside medical records were reviewed to synthesize the above history, along with the history and physical obtained during the visit.  Outside laboratory, pathology, and imaging reports were reviewed, with pertinent results below.    D&C path 2013: Diagnosis Endometrium, curettage GLANDULAR FRAGMENTS WITH SLIGHT ATYPIA, ABUNDANT BLOOD AND BENIGN SQUAMOUS FRAGMENTS. SEE MICROSCOPIC DESCRIPTION. Microscopic Comment The specimen consists predominantly of blood with admixed glandular fragments, most of which have features of endocervical type glands. There are focal areas with slight cytologic atypia and reactive atypia is favored. There are also benign glandular fragments. While several of the glandular fragments may be of endometrial origin, they are too limited for endometrial diagnosis.   ASSESSMENT AND PLAN: CRYSTL LILLARD is a 50 y.o. woman with AUB, BMI 80, and home oxygen use at baseline.  The patient is at high risk for endometrial hyperplasia or malignancy. Her exam is limited due to habitus and surgical history (ablation and cesarean). She needs endometrial sampling and would benefit from IUD placement at that  time. I am unable to sample her in the office despite considerable time trying today. She is consented for D&C with IUD placement in the OR on 10/12. This can be performed under sedation with local. She will see anesthesia preop given her size and medical comorbidities.   A copy of this note was sent to the patient's referring provider. I appreciate the opportunity to be involved in the care of this patient.  Marcello Fennel. Carlis Abbott, MD

## 2016-07-27 NOTE — Patient Instructions (Addendum)
Preparing for your Surgery  Plan for surgery on October 10 or 12 , 2017 with Dr. Everitt Amber at the [facility]. You will be scheduled for a Dilation and Curettage with IUD placement.  Pre-operative Testing -You will receive a phone call from presurgical RN at [facility] to discuss instructions for your procedure.  -We will reach out to your insurance about getting the Mirena IUD for you and we will let you know.  Levonorgestrel intrauterine device (IUD) What is this medicine? LEVONORGESTREL IUD (LEE voe nor jes trel) is a contraceptive (birth control) device. The device is placed inside the uterus by a healthcare professional. It is used to prevent pregnancy and can also be used to treat heavy bleeding that occurs during your period. Depending on the device, it can be used for 3 to 5 years. This medicine may be used for other purposes; ask your health care provider or pharmacist if you have questions. What should I tell my health care provider before I take this medicine? They need to know if you have any of these conditions: -abnormal Pap smear -cancer of the breast, uterus, or cervix -diabetes -endometritis -genital or pelvic infection now or in the past -have more than one sexual partner or your partner has more than one partner -heart disease -history of an ectopic or tubal pregnancy -immune system problems -IUD in place -liver disease or tumor -problems with blood clots or take blood-thinners -use intravenous drugs -uterus of unusual shape -vaginal bleeding that has not been explained -an unusual or allergic reaction to levonorgestrel, other hormones, silicone, or polyethylene, medicines, foods, dyes, or preservatives -pregnant or trying to get pregnant -breast-feeding How should I use this medicine? This device is placed inside the uterus by a health care professional. Talk to your pediatrician regarding the use of this  medicine in children. Special care may be needed. Overdosage: If you think you have taken too much of this medicine contact a poison control center or emergency room at once. NOTE: This medicine is only for you. Do not share this medicine with others. What if I miss a dose? This does not apply. What may interact with this medicine? Do not take this medicine with any of the following medications: -amprenavir -bosentan -fosamprenavir This medicine may also interact with the following medications: -aprepitant -barbiturate medicines for inducing sleep or treating seizures -bexarotene -griseofulvin -medicines to treat seizures like carbamazepine, ethotoin, felbamate, oxcarbazepine, phenytoin, topiramate -modafinil -pioglitazone -rifabutin -rifampin -rifapentine -some medicines to treat HIV infection like atazanavir, indinavir, lopinavir, nelfinavir, tipranavir, ritonavir -St. John's wort -warfarin This list may not describe all possible interactions. Give your health care provider a list of all the medicines, herbs, non-prescription drugs, or dietary supplements you use. Also tell them if you smoke, drink alcohol, or use illegal drugs. Some items may interact with your medicine. What should I watch for while using this medicine? Visit your doctor or health care professional for regular check ups. See your doctor if you or your partner has sexual contact with others, becomes HIV positive, or gets a sexual transmitted disease. This product does not protect you against HIV infection (AIDS) or other sexually transmitted diseases. You can check the placement of the IUD yourself by reaching up to the top of your vagina with clean fingers to feel the threads. Do not pull on the threads. It is a good habit to check placement after each menstrual period. Call your doctor right away if you feel more  of the IUD than just the threads or if you cannot feel the threads at all. The IUD may come out by  itself. You may become pregnant if the device comes out. If you notice that the IUD has come out use a backup birth control method like condoms and call your health care provider. Using tampons will not change the position of the IUD and are okay to use during your period. What side effects may I notice from receiving this medicine? Side effects that you should report to your doctor or health care professional as soon as possible: -allergic reactions like skin rash, itching or hives, swelling of the face, lips, or tongue -fever, flu-like symptoms -genital sores -high blood pressure -no menstrual period for 6 weeks during use -pain, swelling, warmth in the leg -pelvic pain or tenderness -severe or sudden headache -signs of pregnancy -stomach cramping -sudden shortness of breath -trouble with balance, talking, or walking -unusual vaginal bleeding, discharge -yellowing of the eyes or skin Side effects that usually do not require medical attention (report to your doctor or health care professional if they continue or are bothersome): -acne -breast pain -change in sex drive or performance -changes in weight -cramping, dizziness, or faintness while the device is being inserted -headache -irregular menstrual bleeding within first 3 to 6 months of use -nausea This list may not describe all possible side effects. Call your doctor for medical advice about side effects. You may report side effects to FDA at 1-800-FDA-1088. Where should I keep my medicine? This does not apply. NOTE: This sheet is a summary. It may not cover all possible information. If you have questions about this medicine, talk to your doctor, pharmacist, or health care provider.    2016, Elsevier/Gold Standard. (2011-11-17 13:54:04)  Dilation and Curettage or Vacuum Curettage Dilation and curettage (D&C) and vacuum curettage are minor procedures. A D&C involves stretching (dilation) the cervix and scraping (curettage) the  inside lining of the womb (uterus). During a D&C, tissue is gently scraped from the inside lining of the uterus. During a vacuum curettage, the lining and tissue in the uterus are removed with the use of gentle suction.  Curettage may be performed to either diagnose or treat a problem. As a diagnostic procedure, curettage is performed to examine tissues from the uterus. A diagnostic curettage may be performed for the following symptoms:   Irregular bleeding in the uterus.   Bleeding with the development of clots.   Spotting between menstrual periods.   Prolonged menstrual periods.   Bleeding after menopause.   No menstrual period (amenorrhea).   A change in size and shape of the uterus.  As a treatment procedure, curettage may be performed for the following reasons:   Removal of an IUD (intrauterine device).   Removal of retained placenta after giving birth. Retained placenta can cause an infection or bleeding severe enough to require transfusions.   Abortion.   Miscarriage.   Removal of polyps inside the uterus.   Removal of uncommon types of noncancerous lumps (fibroids).  LET [facility] CARE PROVIDER KNOW ABOUT:   Any allergies you have.   All medicines you are taking, including vitamins, herbs, eye drops, creams, and over-the-counter medicines.   Previous problems you or members of your family have had with the use of anesthetics.   Any blood disorders you have.   Previous surgeries you have had.   Medical conditions you have. RISKS AND COMPLICATIONS  Generally, this is a safe procedure. However, as  with any procedure, complications can occur. Possible complications include:  Excessive bleeding.   Infection of the uterus.   Damage to the cervix.   Development of scar tissue (adhesions) inside the uterus, later causing abnormal amounts of menstrual bleeding.   Complications from the general anesthetic, if a general anesthetic is used.    Putting a hole (perforation) in the uterus. This is rare.  BEFORE THE PROCEDURE   Eat and drink before the procedure only as directed by your health care provider.   Arrange for someone to take you home.  PROCEDURE  This procedure usually takes about 15-30 minutes.  You will be given one of the following:  A medicine that numbs the area in and around the cervix (local anesthetic).   A medicine to make you sleep through the procedure (general anesthetic).  You will lie on your back with your legs in stirrups.   A warm metal or plastic instrument (speculum) will be placed in your vagina to keep it open and to allow the health care provider to see the cervix.  There are two ways in which your cervix can be softened and dilated. These include:   Taking a medicine.   Having thin rods (laminaria) inserted into your cervix.   A curved tool (curette) will be used to scrape cells from the inside lining of the uterus. In some cases, gentle suction is applied with the curette. The curette will then be removed.  AFTER THE PROCEDURE   You will rest in the recovery area until you are stable and are ready to go home.   You may feel sick to your stomach (nauseous) or throw up (vomit) if you were given a general anesthetic.   You may have a sore throat if a tube was placed in your throat during general anesthesia.   You may have light cramping and bleeding. This may last for 2 days to 2 weeks after the procedure.   Your uterus needs to make a new lining after the procedure. This may make your next period late.   This information is not intended to replace advice given to you by your health care provider. Make sure you discuss any questions you have with your health care provider.   Document Released: 10/17/2005 Document Revised: 06/19/2013 Document Reviewed: 05/16/2013 Elsevier Interactive Patient Education [employer].

## 2016-07-28 ENCOUNTER — Encounter (HOSPITAL_COMMUNITY): Payer: Self-pay

## 2016-07-28 ENCOUNTER — Encounter (HOSPITAL_COMMUNITY)
Admission: RE | Admit: 2016-07-28 | Discharge: 2016-07-28 | Disposition: A | Discharge status: Home / Self Care | Payer: Medicare Other | Source: Ambulatory Visit | Attending: Gynecologic Oncology | Admitting: Gynecologic Oncology

## 2016-07-28 ENCOUNTER — Ambulatory Visit (HOSPITAL_COMMUNITY)
Admission: RE | Admit: 2016-07-28 | Discharge: 2016-07-28 | Disposition: A | Discharge status: Home / Self Care | Payer: Medicare Other | Source: Ambulatory Visit | Attending: Anesthesiology | Admitting: Anesthesiology

## 2016-07-28 DIAGNOSIS — Z01818 Encounter for other preprocedural examination: Secondary | ICD-10-CM | POA: Diagnosis not present

## 2016-07-28 DIAGNOSIS — N939 Abnormal uterine and vaginal bleeding, unspecified: Secondary | ICD-10-CM | POA: Diagnosis not present

## 2016-07-28 DIAGNOSIS — Z01812 Encounter for preprocedural laboratory examination: Secondary | ICD-10-CM | POA: Insufficient documentation

## 2016-07-28 DIAGNOSIS — I10 Essential (primary) hypertension: Secondary | ICD-10-CM | POA: Diagnosis not present

## 2016-07-28 LAB — CBC
HEMATOCRIT: 41.4 % (ref 36.0–46.0)
Hemoglobin: 12.1 g/dL (ref 12.0–15.0)
MCH: 24.2 pg — ABNORMAL LOW (ref 26.0–34.0)
MCHC: 29.2 g/dL — AB (ref 30.0–36.0)
MCV: 82.6 fL (ref 78.0–100.0)
Platelets: 221 10*3/uL (ref 150–400)
RBC: 5.01 MIL/uL (ref 3.87–5.11)
RDW: 27.6 % — AB (ref 11.5–15.5)
WBC: 6 10*3/uL (ref 4.0–10.5)

## 2016-07-28 LAB — BASIC METABOLIC PANEL
Anion gap: 8 (ref 5–15)
BUN: 15 mg/dL (ref 6–20)
CHLORIDE: 98 mmol/L — AB (ref 101–111)
CO2: 32 mmol/L (ref 22–32)
Calcium: 9.2 mg/dL (ref 8.9–10.3)
Creatinine, Ser: 0.72 mg/dL (ref 0.44–1.00)
GFR calc Af Amer: >60 mL/min (ref >60)
GFR calc non Af Amer: >60 mL/min (ref >60)
GLUCOSE: 126 mg/dL — AB (ref 65–99)
POTASSIUM: 4.2 mmol/L (ref 3.5–5.1)
Sodium: 138 mmol/L (ref 135–145)

## 2016-07-28 LAB — HCG, SERUM, QUALITATIVE: Preg, Serum: NEGATIVE

## 2016-07-28 NOTE — Patient Instructions (Signed)
[name]  07/28/2016   Your procedure is scheduled on: 08/09/2016    Report to [facility]  Entrance take [place]  elevators to 3rd floor to  [place] at   [place] AM.  Call this number if you have problems the morning of surgery [phone]   Remember: ONLY 1 PERSON MAY GO WITH YOU TO SHORT STAY TO GET  READY MORNING OF YOUR SURGERY.  Do not eat food or drink liquids :After Midnight.     Take these medicines the morning of surgery with A SIP OF WATER: Albuterol Inhaler if needed and brin, Hydrocodone if needed, Synthroid, metoprolol ( toprol)                                You may not have any metal on your body including hair pins and              piercings  Do not wear jewelry, make-up, lotions, powders or perfumes, deodorant             Do not wear nail polish.  Do not shave  48 hours prior to surgery.     Do not bring valuables to the hospital. [name].  Contacts, dentures or bridgework may not be worn into surgery.       Patients discharged the day of surgery will not be allowed to drive home.  Name and phone number of your driver:son   Special Instructions: N/A              Please read over the following fact sheets you were given: _____________________________________________________________________             [facility] - Preparing for Surgery Before surgery, you can play an important role.  Because skin is not sterile, your skin needs to be as free of germs as possible.  You can reduce the number of germs on your skin by washing with CHG (chlorahexidine gluconate) soap before surgery.  CHG is an antiseptic cleaner which kills germs and bonds with the skin to continue killing germs even after washing. Please DO NOT use if you have an allergy to CHG or antibacterial soaps.  If your skin becomes reddened/irritated stop using the CHG and inform your nurse when you arrive at  Short Stay. Do not shave (including legs and underarms) for at least 48 hours prior to the first CHG shower.  You may shave your face/neck. Please follow these instructions carefully:  1.  Shower with CHG Soap the night before surgery and the  morning of Surgery.  2.  If you choose to wash your hair, wash your hair first as usual with your  normal  shampoo.  3.  After you shampoo, rinse your hair and body thoroughly to remove the  shampoo.                           4.  Use CHG as you would any other liquid soap.  You can apply chg directly  to the skin and wash                       Gently with  a scrungie or clean washcloth.  5.  Apply the CHG Soap to your body ONLY FROM THE NECK DOWN.   Do not use on face/ open                           Wound or open sores. Avoid contact with eyes, ears mouth and genitals (private parts).                       Wash face,  Genitals (private parts) with your normal soap.             6.  Wash thoroughly, paying special attention to the area where your surgery  will be performed.  7.  Thoroughly rinse your body with warm water from the neck down.  8.  DO NOT shower/wash with your normal soap after using and rinsing off  the CHG Soap.                9.  Pat yourself dry with a clean towel.            10.  Wear clean pajamas.            11.  Place clean sheets on your bed the night of your first shower and do not  sleep with pets. Day of Surgery : Do not apply any lotions/deodorants the morning of surgery.  Please wear clean clothes to the hospital/surgery center.  FAILURE TO FOLLOW THESE INSTRUCTIONS MAY RESULT IN THE CANCELLATION OF YOUR SURGERY PATIENT SIGNATURE_________________________________  NURSE SIGNATURE__________________________________  ________________________________________________________________________

## 2016-07-28 NOTE — Progress Notes (Signed)
Serum preg done due to difficulty in getting on commode.

## 2016-08-09 ENCOUNTER — Other Ambulatory Visit: Payer: Self-pay | Admitting: Gynecologic Oncology

## 2016-08-09 ENCOUNTER — Ambulatory Visit (HOSPITAL_COMMUNITY): Payer: Medicare Other | Admitting: Registered Nurse

## 2016-08-09 ENCOUNTER — Ambulatory Visit (HOSPITAL_COMMUNITY)
Admission: RE | Admit: 2016-08-09 | Discharge: 2016-08-09 | Disposition: A | Discharge status: Home / Self Care | Payer: Medicare Other | Source: Ambulatory Visit | Attending: Gynecologic Oncology | Admitting: Gynecologic Oncology

## 2016-08-09 ENCOUNTER — Encounter (HOSPITAL_COMMUNITY): Payer: Self-pay | Admitting: Gynecologic Oncology

## 2016-08-09 ENCOUNTER — Encounter (HOSPITAL_COMMUNITY)
Admission: RE | Disposition: A | Discharge status: Home / Self Care | Payer: Self-pay | Source: Ambulatory Visit | Attending: Gynecologic Oncology

## 2016-08-09 DIAGNOSIS — G4730 Sleep apnea, unspecified: Secondary | ICD-10-CM | POA: Diagnosis not present

## 2016-08-09 DIAGNOSIS — D649 Anemia, unspecified: Secondary | ICD-10-CM | POA: Diagnosis not present

## 2016-08-09 DIAGNOSIS — J449 Chronic obstructive pulmonary disease, unspecified: Secondary | ICD-10-CM | POA: Insufficient documentation

## 2016-08-09 DIAGNOSIS — Y92530 Ambulatory surgery center as the place of occurrence of the external cause: Secondary | ICD-10-CM | POA: Diagnosis not present

## 2016-08-09 DIAGNOSIS — K219 Gastro-esophageal reflux disease without esophagitis: Secondary | ICD-10-CM | POA: Insufficient documentation

## 2016-08-09 DIAGNOSIS — N858 Other specified noninflammatory disorders of uterus: Secondary | ICD-10-CM | POA: Diagnosis not present

## 2016-08-09 DIAGNOSIS — I509 Heart failure, unspecified: Secondary | ICD-10-CM | POA: Diagnosis not present

## 2016-08-09 DIAGNOSIS — Z975 Presence of (intrauterine) contraceptive device: Secondary | ICD-10-CM

## 2016-08-09 DIAGNOSIS — F458 Other somatoform disorders: Secondary | ICD-10-CM | POA: Diagnosis not present

## 2016-08-09 DIAGNOSIS — M159 Polyosteoarthritis, unspecified: Secondary | ICD-10-CM | POA: Diagnosis not present

## 2016-08-09 DIAGNOSIS — I110 Hypertensive heart disease with heart failure: Secondary | ICD-10-CM | POA: Insufficient documentation

## 2016-08-09 DIAGNOSIS — S30814A Abrasion of vagina and vulva, initial encounter: Secondary | ICD-10-CM | POA: Insufficient documentation

## 2016-08-09 DIAGNOSIS — Z9989 Dependence on other enabling machines and devices: Secondary | ICD-10-CM | POA: Insufficient documentation

## 2016-08-09 DIAGNOSIS — Z79899 Other long term (current) drug therapy: Secondary | ICD-10-CM | POA: Diagnosis present

## 2016-08-09 DIAGNOSIS — Z6841 Body Mass Index (BMI) 40.0 and over, adult: Secondary | ICD-10-CM | POA: Diagnosis not present

## 2016-08-09 DIAGNOSIS — D869 Sarcoidosis, unspecified: Secondary | ICD-10-CM | POA: Diagnosis not present

## 2016-08-09 DIAGNOSIS — N939 Abnormal uterine and vaginal bleeding, unspecified: Secondary | ICD-10-CM

## 2016-08-09 DIAGNOSIS — X58XXXA Exposure to other specified factors, initial encounter: Secondary | ICD-10-CM | POA: Diagnosis not present

## 2016-08-09 DIAGNOSIS — E039 Hypothyroidism, unspecified: Secondary | ICD-10-CM | POA: Diagnosis not present

## 2016-08-09 DIAGNOSIS — G4733 Obstructive sleep apnea (adult) (pediatric): Secondary | ICD-10-CM | POA: Diagnosis not present

## 2016-08-09 DIAGNOSIS — Z9114 Patient's other noncompliance with medication regimen: Secondary | ICD-10-CM | POA: Diagnosis not present

## 2016-08-09 DIAGNOSIS — Z9981 Dependence on supplemental oxygen: Secondary | ICD-10-CM | POA: Diagnosis not present

## 2016-08-09 DIAGNOSIS — I10 Essential (primary) hypertension: Secondary | ICD-10-CM | POA: Diagnosis not present

## 2016-08-09 HISTORY — PX: DILATION AND CURETTAGE OF UTERUS: SHX78

## 2016-08-09 HISTORY — PX: INTRAUTERINE DEVICE (IUD) INSERTION: SHX5877

## 2016-08-09 SURGERY — DILATION AND CURETTAGE
Anesthesia: Monitor Anesthesia Care

## 2016-08-09 MED ORDER — KETAMINE HCL 10 MG/ML IJ SOLN
INTRAMUSCULAR | Status: DC | PRN
  Administered 2016-08-09: 10 mg via INTRAVENOUS
  Administered 2016-08-09 (×3): 20 mg via INTRAVENOUS
  Administered 2016-08-09 (×2): 10 mg via INTRAVENOUS
  Administered 2016-08-09: 20 mg via INTRAVENOUS
  Administered 2016-08-09: 10 mg via INTRAVENOUS
  Administered 2016-08-09 (×2): 20 mg via INTRAVENOUS
  Administered 2016-08-09 (×2): 10 mg via INTRAVENOUS
  Administered 2016-08-09: 20 mg via INTRAVENOUS

## 2016-08-09 MED ORDER — FENTANYL CITRATE (PF) 100 MCG/2ML IJ SOLN
INTRAMUSCULAR | Status: AC
  Filled 2016-08-09: qty 2

## 2016-08-09 MED ORDER — DEXAMETHASONE SODIUM PHOSPHATE 10 MG/ML IJ SOLN
INTRAMUSCULAR | Status: AC
  Filled 2016-08-09: qty 1

## 2016-08-09 MED ORDER — LIDOCAINE HCL 1 % IJ SOLN
INTRAMUSCULAR | Status: AC
Start: 2016-08-09 — End: 2016-08-09
  Filled 2016-08-09: qty 20

## 2016-08-09 MED ORDER — LIDOCAINE HCL 1 % IJ SOLN
INTRAMUSCULAR | Status: DC | PRN
  Administered 2016-08-09: 9 mL

## 2016-08-09 MED ORDER — LIDOCAINE 2% (20 MG/ML) 5 ML SYRINGE
INTRAMUSCULAR | Status: AC
  Filled 2016-08-09: qty 5

## 2016-08-09 MED ORDER — KETAMINE HCL 10 MG/ML IJ SOLN
INTRAMUSCULAR | Status: AC
  Filled 2016-08-09: qty 1

## 2016-08-09 MED ORDER — 0.9 % SODIUM CHLORIDE (POUR BTL) OPTIME
TOPICAL | Status: DC | PRN
  Administered 2016-08-09: 1000 mL

## 2016-08-09 MED ORDER — ONDANSETRON HCL 4 MG/2ML IJ SOLN
INTRAMUSCULAR | Status: DC | PRN
Start: 2016-08-09 — End: 2016-08-09
  Administered 2016-08-09: 4 mg via INTRAVENOUS

## 2016-08-09 MED ORDER — PROPOFOL 10 MG/ML IV BOLUS
INTRAVENOUS | Status: AC
  Filled 2016-08-09: qty 40

## 2016-08-09 MED ORDER — MIDAZOLAM HCL 5 MG/5ML IJ SOLN
INTRAMUSCULAR | Status: DC | PRN
  Administered 2016-08-09 (×2): 1 mg via INTRAVENOUS

## 2016-08-09 MED ORDER — IBUPROFEN 800 MG PO TABS: 800.0000 mg | ORAL_TABLET | Freq: Three times a day (TID) | ORAL | 0 refills | Status: DC | PRN

## 2016-08-09 MED ORDER — DEXAMETHASONE SODIUM PHOSPHATE 10 MG/ML IJ SOLN
INTRAMUSCULAR | Status: DC | PRN
  Administered 2016-08-09: 8 mg via INTRAVENOUS

## 2016-08-09 MED ORDER — ONDANSETRON HCL 4 MG/2ML IJ SOLN
INTRAMUSCULAR | Status: AC
  Filled 2016-08-09: qty 2

## 2016-08-09 MED ORDER — FENTANYL CITRATE (PF) 100 MCG/2ML IJ SOLN: 25.0000 ug | INTRAMUSCULAR | Status: DC | PRN

## 2016-08-09 MED ORDER — PROPOFOL 10 MG/ML IV BOLUS
INTRAVENOUS | Status: DC | PRN
  Administered 2016-08-09 (×10): 20 mg via INTRAVENOUS

## 2016-08-09 MED ORDER — MIDAZOLAM HCL 2 MG/2ML IJ SOLN
INTRAMUSCULAR | Status: AC
  Filled 2016-08-09: qty 2

## 2016-08-09 MED ORDER — ALBUTEROL SULFATE HFA 108 (90 BASE) MCG/ACT IN AERS
INHALATION_SPRAY | RESPIRATORY_TRACT | Status: AC
Start: 2016-08-09 — End: 2016-08-09
  Filled 2016-08-09: qty 6.7

## 2016-08-09 MED ORDER — ALBUTEROL SULFATE HFA 108 (90 BASE) MCG/ACT IN AERS
INHALATION_SPRAY | RESPIRATORY_TRACT | Status: DC | PRN
  Administered 2016-08-09: 2 via RESPIRATORY_TRACT

## 2016-08-09 MED ORDER — LACTATED RINGERS IV SOLN
INTRAVENOUS | Status: DC
Start: 2016-08-09 — End: 2016-08-09

## 2016-08-09 MED ORDER — LIDOCAINE 2% (20 MG/ML) 5 ML SYRINGE
INTRAMUSCULAR | Status: DC | PRN
  Administered 2016-08-09: 40 mg via INTRAVENOUS

## 2016-08-09 MED ORDER — LACTATED RINGERS IV SOLN
INTRAVENOUS | Status: DC
  Administered 2016-08-09: 1000 mL via INTRAVENOUS

## 2016-08-09 SURGICAL SUPPLY — 23 items
CATH ROBINSON RED A/P 16FR (CATHETERS) ×2 IMPLANT
COVER SURGICAL LIGHT HANDLE (MISCELLANEOUS) ×2 IMPLANT
DRAPE SHEET LG 3/4 BI-LAMINATE (DRAPES) ×3 IMPLANT
DRSG TELFA 3X8 NADH (GAUZE/BANDAGES/DRESSINGS) ×2 IMPLANT
GAUZE SPONGE 4X4 16PLY XRAY LF (GAUZE/BANDAGES/DRESSINGS) ×2 IMPLANT
GLOVE BIO SURGEON STRL SZ 6 (GLOVE) ×4 IMPLANT
GOWN STRL REUS W/TWL LRG LVL3 (GOWN DISPOSABLE) ×4 IMPLANT
KIT BASIN OR (CUSTOM PROCEDURE TRAY) ×2 IMPLANT
LUBRICANT JELLY K Y 4OZ (MISCELLANEOUS) ×2 IMPLANT
NS IRRIG 1000ML POUR BTL (IV SOLUTION) ×2 IMPLANT
PACK LITHOTOMY IV (CUSTOM PROCEDURE TRAY) ×2 IMPLANT
PAD DRESSING TELFA 3X8 NADH (GAUZE/BANDAGES/DRESSINGS) ×1 IMPLANT
PAD OB MATERNITY 4.3X12.25 (PERSONAL CARE ITEMS) ×2 IMPLANT
SLEEVE SURGEON STRL (DRAPES) ×1 IMPLANT
SUT VIC AB 0 CT1 27 (SUTURE) ×2
SUT VIC AB 0 CT1 27XBRD ANTBC (SUTURE) ×1 IMPLANT
SUT VIC AB 2-0 SH 27 (SUTURE) ×2
SUT VIC AB 2-0 SH 27X BRD (SUTURE) IMPLANT
SYR CONTROL 10ML LL (SYRINGE) ×1 IMPLANT
TOWEL OR 17X26 10 PK STRL BLUE (TOWEL DISPOSABLE) ×2 IMPLANT
UNDERPAD 30X30 (UNDERPADS AND DIAPERS) ×2 IMPLANT
UNDERPAD 30X30 INCONTINENT (UNDERPADS AND DIAPERS) ×2 IMPLANT
YANKAUER SUCT BULB TIP 10FT TU (MISCELLANEOUS) ×2 IMPLANT

## 2016-08-09 NOTE — Op Note (Addendum)
PATIENT: [name] DATE: 08/09/16   Preop Diagnosis: extreme morbid obesity (BMI 80), abnormal uterine bleeding  Postoperative Diagnosis: same  Surgery: D&C, Mirena IUD placement (Lot # [id number]) (22 modifier for extreme obesity requiring additional time, OR personnel and equipment)   Surgeons:  Donaciano Eva, MD Assistant: none  Anesthesia: MAC + local  Estimated blood loss: <50 ml  IVF:  236ml   Urine output: 50 ml   Complications: None   Pathology: endometrial biopsy  Operative findings: unable to visualize cervix due to extreme morbid obesity. Palpably normal cervix. Uterus sounded to 12cm. Old blood evacuated from endometrial canal.   Procedure: The patient was identified in the preoperative holding area. Informed consent was signed on the chart. Patient was seen history was reviewed and exam was performed.   The patient was then taken to the operating room and placed in the supine position with SCD hose on. MAC anesthesia was then induced without difficulty. She was then placed in the dorsolithotomy position. Additional OR personnel were required to assist with patient positioning due to extreme obesity. The time to position the patient was increased by 100% due to extreme obesity requiring additional assistance, equipment (eg bed extenders) and padding to be placed. The perineum was prepped with Betadine. The vagina was prepped with Betadine. The patient was then draped after the prep was dried. An in and out catheterization to empty the bladder was performed under sterile conditions.  Timeout was performed the patient, procedure, antibiotic, allergy, and length of procedure.   The weighted speculum was placed in the posterior vagina however the cervix was not able to be visualized due to extreme vaginal length and vaginal side-wall encroachment from adiposity. Additional OR personnel were required to scrub the case to provide retraction of the thighs  and labia. The cervix was blindly grasped with a tenaculum. The uterine sound was inserted into the palpable cervical os to 12cm depth. The pratts dilators were gently passed through the cervical os to dilate the cervix to 103mm. This was done blindly due to extreme obesity preventing visualiztion. An endometrial pipelle was passed twice to scrape and curette the endometrial lining for sampling.   The Mirena IUD was taken from its pack. (Lot # [id number]).  Blindly, using digital guidance, the device was passed through the dilated cervix and deployed at a 10cm depth.  The strings were cut at 5cm.   Bleeding was noted from the right periurethral tissues due to a 2cm abrasion from retraction. This was an unavoidable tissue effect due to stretching of the periurethral tissues during manipulation and not a complication.  2-0 vicryl suture was placed in figure of 8 fashion to create hemostasis. The vagina was irrigated.  All instrument, suture, laparotomy, Ray-Tec, and needle counts were correct x2. The patient tolerated the procedure well and was taken recovery room in stable condition. This is Everitt Amber dictating an operative note on [employer].  Donaciano Eva, MD

## 2016-08-09 NOTE — H&P (View-Only) (Signed)
GYNECOLOGIC ONCOLOGY NEW PATIENT CONSULTATION  Date of Service: 07/27/16 Referring Provider: Christophe Louis, MD Consulting Provider: Marcello Fennel. Carlis Abbott, MD  HISTORY OF PRESENT ILLNESS: [name] is a 50 y.o. woman who is seen in consultation at the request of Dr. Landry Mellow for evaluation of AUB in the setting of morbid obesity (BMI 80) and multiple medical comorbidities.  Ms. Brodrick has super morbid obesity resulting in chronic hyperventilation syndrome, OSA, GERD, and CHF. Her last EF was 60%. She requires 2L of oxygen at baseline for dyspnea and desaturations with walking. She also has a history of sarcoidosis. She has had abnormal uterine bleeding since her 58s. She has previously had a D&C with predominantly endocervical glands in 2013 and an endometrial ablation in 2013. She reports that this "did not take" and she continued to bleed since that time. She only bleeds with her menstrual cycles which can be very heavy. She has required transfusion x2 (April and August) and IV iron for her bleeding. She is non-compliant with PO iron. She strongly desires hysterectomy. She reports her most recent period was considerably lighter than prior. Her mother and sisters all went through menopause in their 78s. She denies dysplasia. Her last pap was 2013.   PAST MEDICAL HISTORY: Past Medical History:  Diagnosis Date  . Anemia   . Arthritis    hands, shoulders, no meds  . CHF (congestive heart failure) (HCC)    EF60-65%  . Chronic hyperventilation syndrome    w/ obesity tx with albuterol inhaler and oxygen 2L  . COPD (chronic obstructive pulmonary disease) ([place])    uses oxygen 2 L  . Gallstones   . GERD (gastroesophageal reflux disease)    diet controlled - no meds  . H/O hiatal hernia   . Hypertension   . Hypothyroidism   . Kidney stones   . Morbid obesity ([place])   . Pneumonia    hoispitalized in 08/2011  . Sarcoidosis ([place])   . Seasonal allergies   . Shortness of breath   . Sleep apnea    uses CPAP machine     PAST SURGICAL HISTORY: Past Surgical History:  Procedure Laterality Date  . [place]   x 1  . CHOLECYSTECTOMY  2000  . HYSTEROSCOPY W/D&C  12/27/2011   Procedure: DILATATION AND CURETTAGE /HYSTEROSCOPY;  Surgeon: Maeola Sarah. Landry Mellow, MD;  Location: [facility];  Service: Gynecology;;  . I and D of abcess  05/2011  . LUNG BIOPSY    . uterine abletion      OB/GYN HISTORY: G1P1, CS x1 (son) Age at menopause: n/a Hx of HRT: denies Hx of STDs: denies Last pap: 2013 History of abnormal pap smears: denies  SCREENING STUDIES:  Last colonoscopy: never  MEDICATIONS:  Current Outpatient Prescriptions:  .  acetaminophen (TYLENOL) 500 MG tablet, Take 500-1,000 mg by mouth every 6 (six) hours as needed for moderate pain., Disp: , Rfl:  .  albuterol (PROVENTIL HFA;VENTOLIN HFA) 108 (90 Base) MCG/ACT inhaler, Inhale 1-2 puffs into the lungs every 6 (six) hours as needed for wheezing., Disp: 1 Inhaler, Rfl: 0 .  cetirizine (ZYRTEC) 10 MG tablet, Take 10 mg by mouth at bedtime as needed for allergies., Disp: , Rfl:  .  cholecalciferol (VITAMIN D) 400 UNITS TABS tablet, Take 400 Units by mouth daily., Disp: , Rfl:  .  Coral Calcium 1000 (390 CA) MG TABS, Take 1 tablet by mouth daily., Disp: , Rfl:  .  ferrous sulfate 325 (65 FE) MG tablet,  Take 1 tablet (325 mg total) by mouth 3 (three) times daily with meals., Disp: 270 tablet, Rfl: 11 .  folic acid (FOLVITE) 1 MG tablet, Take 1 mg by mouth daily., Disp: , Rfl: 3 .  furosemide (LASIX) 20 MG tablet, Take 3 tablets (60 mg total) by mouth 2 (two) times daily. (Patient taking differently: Take 20 mg by mouth 2 (two) times daily. ), Disp: 60 tablet, Rfl: 0 .  levothyroxine (SYNTHROID, LEVOTHROID) 100 MCG tablet, Take 1 tablet (100 mcg total) by mouth daily at 6 (six) AM., Disp: 30 tablet, Rfl: 4 .  losartan (COZAAR) 50 MG tablet, TAKE 1 TABLET (50 MG TOTAL) BY MOUTH DAILY., Disp: 30 tablet, Rfl: 5 .  methotrexate 50 MG/2ML  injection, Inject 50 mg into the skin every Friday., Disp: , Rfl: 3 .  metoprolol succinate (TOPROL-XL) 25 MG 24 hr tablet, TAKE 1 TABLET (25 MG TOTAL) BY MOUTH DAILY., Disp: 30 tablet, Rfl: 5 .  NON FORMULARY, 2 liter of oxygen, Disp: , Rfl:  .  triamcinolone acetonide (KENALOG) 40 MG/ML injection, Inject 40 mg into the muscle every 3 (three) months. For knee pain, Disp: , Rfl:  .  triamcinolone ointment (KENALOG) 0.1 %, Apply 1 application topically 2 (two) times daily. Once or twice weekly as needed for break out of Sarcoidosis, Disp: , Rfl:  .  vitamin B-12 1000 MCG tablet, Take 1 tablet (1,000 mcg total) by mouth daily., Disp: 30 tablet, Rfl: 0  ALLERGIES: No Known Allergies  FAMILY HISTORY: Family History  Problem Relation Age of Onset  . Diabetes Father   . Cancer Father 67    colon cancer   . Diabetes Brother   . Deep vein thrombosis Mother   . Aneurysm Sister     d/o brain aneurysm    SOCIAL HISTORY: Social History   Social History  . Marital status: Single    Spouse name: N/A  . Number of children: N/A  . Years of education: N/A   Occupational History  . unemployeed    Social History Main Topics  . Smoking status: Never Smoker  . Smokeless tobacco: Never Used  . Alcohol use Yes     Comment: occasionally  . Drug use: No  . Sexual activity: No   Other Topics Concern  . Not on file   Social History Narrative   Is not currently working.    Divorced for eight or nine years   Has one son who lives around here.        REVIEW OF SYSTEMS: Complete 10-system review is negative except for the following: as per HPI.  PHYSICAL EXAM: BP 129/76 (BP Location: Left Arm, Patient Position: Sitting) Comment: right wrist  Pulse 89   Temp 98.4 F (36.9 C) (Oral)   Resp 18   Wt (!) 498 lb (225.9 kg)   SpO2 100%   BMI 80.38 kg/m  General: Alert, oriented, no acute distress. HEENT: Normocephalic, atraumatic.  Sclera anicteric, posterior oropharynx clear.  Normal  dentition. Chest: Clear to auscultation bilaterally. Cardiovascular: Regular rate and rhythm, no murmurs, rubs, or gallops. Abdomen: Obese. Normoactive bowel sounds.  Soft, nondistended, nontender to palpation.  No masses or hepatosplenomegaly appreciated.  No evidence of hernia.  No palpable fluid wave.   Extremities: Grossly normal range of motion.  Warm, well perfused.   Skin: No rashes or lesions. GU: External genitalia without lesions.  On speculum exam, cervix unable to be visualized due to obesity and retraction behind the symphysis. Attempted  endometrial biopsy blindly but unable to pass pipelle through internal os.  Bimanual exam reveals small cervix tucked behind the symphysis without ability to palpate organs due to habitus.   LABORATORY AND RADIOLOGIC DATA: Outside medical records were reviewed to synthesize the above history, along with the history and physical obtained during the visit.  Outside laboratory, pathology, and imaging reports were reviewed, with pertinent results below.    D&C path 2013: Diagnosis Endometrium, curettage GLANDULAR FRAGMENTS WITH SLIGHT ATYPIA, ABUNDANT BLOOD AND BENIGN SQUAMOUS FRAGMENTS. SEE MICROSCOPIC DESCRIPTION. Microscopic Comment The specimen consists predominantly of blood with admixed glandular fragments, most of which have features of endocervical type glands. There are focal areas with slight cytologic atypia and reactive atypia is favored. There are also benign glandular fragments. While several of the glandular fragments may be of endometrial origin, they are too limited for endometrial diagnosis.   ASSESSMENT AND PLAN: JSSICA MELESIO is a 50 y.o. woman with AUB, BMI 80, and home oxygen use at baseline.  The patient is at high risk for endometrial hyperplasia or malignancy. Her exam is limited due to habitus and surgical history (ablation and cesarean). She needs endometrial sampling and would benefit from IUD placement at that  time. I am unable to sample her in the office despite considerable time trying today. She is consented for D&C with IUD placement in the OR on 10/12. This can be performed under sedation with local. She will see anesthesia preop given her size and medical comorbidities.   A copy of this note was sent to the patient's referring provider. I appreciate the opportunity to be involved in the care of this patient.  Marcello Fennel. Carlis Abbott, MD

## 2016-08-09 NOTE — Transfer of Care (Signed)
Immediate Anesthesia Transfer of Care Note  Patient: [name]  Procedure(s) Performed: Procedure(s): DILATATION AND CURETTAGE (N/A) INTRAUTERINE DEVICE (IUD) INSERTION (N/A)  Patient Location: PACU  Anesthesia Type:MAC  Level of Consciousness:  sedated, patient cooperative and responds to stimulation  Airway & Oxygen Therapy:Patient Spontanous Breathing and Patient connected to face mask oxgen  Post-op Assessment:  Report given to PACU RN and Post -op Vital signs reviewed and stable  Post vital signs:  Reviewed and stable  Last Vitals:  Vitals:   08/09/16 0720  BP: (!) 179/93  Pulse: 95  Temp: 123XX123 C    Complications: No apparent anesthesia complications

## 2016-08-09 NOTE — Discharge Instructions (Signed)
Dilation and Curettage or Vacuum Curettage, Care After Refer to this sheet in the next few weeks. These instructions provide you with information on caring for yourself after your procedure. Your health care provider may also give you more specific instructions. Your treatment has been planned according to current medical practices, but problems sometimes occur. Call your health care provider if you have any problems or questions after your procedure. WHAT TO EXPECT AFTER THE PROCEDURE After your procedure, it is typical to have light cramping and bleeding. This may last for 2 days to 2 weeks after the procedure. HOME CARE INSTRUCTIONS   Do not drive for 24 hours.  Wait 1 week before returning to strenuous activities.  Take your temperature 2 times a day for 4 days and write it down. Provide these temperatures to your health care provider if you develop a fever.  Avoid long periods of standing.  Avoid heavy lifting, pushing, or pulling. Do not lift anything heavier than 10 pounds (4.5 kg).  Limit stair climbing to once or twice a day.  Take rest periods often.  You may resume your usual diet.  Drink enough fluids to keep your urine clear or pale yellow.  Your usual bowel function should return. If you have constipation, you may: ? Take a mild laxative with permission from your health care provider. ? Add fruit and bran to your diet. ? Drink more fluids.  Take showers instead of baths until your health care provider gives you permission to take baths.  Do not go swimming or use a hot tub until your health care provider approves.  Try to have someone with you or available to you the first 24-48 hours, especially if you were given a general anesthetic.  Do not douche, use tampons, or have sex (intercourse) for 2 weeks after the procedure.  Only take over-the-counter or prescription medicines as directed by your health care provider.  Follow up with your health care provider as  directed. SEEK MEDICAL CARE IF:   You have increasing cramps or pain that is not relieved with medicine.  You have abdominal pain that does not seem to be related to the same area of earlier cramping and pain.  You have bad smelling vaginal discharge.  You have a rash.  You are having problems with any medicine. SEEK IMMEDIATE MEDICAL CARE IF:   You have bleeding that is heavier than a normal menstrual period.  You have a fever.  You have chest pain.  You have shortness of breath.  You feel dizzy or feel like fainting.  You pass out.  You have pain in your shoulder strap area.  You have heavy vaginal bleeding with or without blood clots. MAKE SURE YOU:   Understand these instructions.  Will watch your condition.  Will get help right away if you are not doing well or get worse.  This information is not intended to replace advice given to you by your health care provider. Make sure you discuss any questions you have with your health care provider.  Document Released: 10/14/2000 Document Revised: 10/22/2013 Document Reviewed: 05/16/2013 Elsevier Interactive Patient Education 2016 [employer].   Contact Dr Anora Schwenke's office at [phone] with questions.

## 2016-08-09 NOTE — Interval H&P Note (Signed)
History and Physical Interval Note:  08/09/2016 7:15 AM  [name]  has presented today for surgery, with the diagnosis of abnormal uterine bleeding  The various methods of treatment have been discussed with the patient and family. After consideration of risks, benefits and other options for treatment, the patient has consented to  Procedure(s): DILATATION AND CURETTAGE (N/A) INTRAUTERINE DEVICE (IUD) INSERTION (N/A) as a surgical intervention .  The patient's history has been reviewed, patient examined, no change in status, stable for surgery.  I have reviewed the patient's chart and labs.  Questions were answered to the patient's satisfaction.     Donaciano Eva

## 2016-08-09 NOTE — Anesthesia Preprocedure Evaluation (Addendum)
Anesthesia Evaluation  Patient identified by MRN, date of birth, ID band Patient awake    Reviewed: Allergy & Precautions, NPO status , Patient's Chart, lab work & pertinent test results, reviewed documented beta blocker date and time   Airway Mallampati: III  TM Distance: >3 FB Neck ROM: Full    Dental  (+) Teeth Intact, Dental Advisory Given   Pulmonary sleep apnea and Continuous Positive Airway Pressure Ventilation , COPD,  COPD inhaler and oxygen dependent,     + decreased breath sounds      Cardiovascular hypertension, Pt. on medications and Pt. on home beta blockers +CHF   Rhythm:Regular Rate:Normal     Neuro/Psych PSYCHIATRIC DISORDERS Anxiety negative neurological ROS     GI/Hepatic Neg liver ROS, hiatal hernia, GERD  ,  Endo/Other  Hypothyroidism   Renal/GU   negative genitourinary   Musculoskeletal  (+) Arthritis , Osteoarthritis,    Abdominal   Peds negative pediatric ROS (+)  Hematology   Anesthesia Other Findings   Reproductive/Obstetrics                            Lab Results  Component Value Date   WBC 6.0 07/28/2016   HGB 12.1 07/28/2016   HCT 41.4 07/28/2016   MCV 82.6 07/28/2016   PLT 221 07/28/2016   Lab Results  Component Value Date   CREATININE 0.72 07/28/2016   BUN 15 07/28/2016   NA 138 07/28/2016   K 4.2 07/28/2016   CL 98 (L) 07/28/2016   CO2 32 07/28/2016   Lab Results  Component Value Date   INR 1.06 07/08/2015   INR 1.13 09/09/2012   INR 1.07 08/23/2011   EKG: sinus tachycardia.  Echo 2016 - Left ventricle: The cavity size was normal. Wall thickness was increased in a pattern of mild LVH. Systolic function was normal. The estimated ejection fraction was in the range of 60% to 65%.  Anesthesia Physical Anesthesia Plan  ASA: III  Anesthesia Plan: MAC   Post-op Pain Management:    Induction: Intravenous  Airway Management Planned:  Mask  Additional Equipment:   Intra-op Plan:   Post-operative Plan:   Informed Consent: I have reviewed the patients History and Physical, chart, labs and discussed the procedure including the risks, benefits and alternatives for the proposed anesthesia with the patient or authorized representative who has indicated his/her understanding and acceptance.     Plan Discussed with: CRNA  Anesthesia Plan Comments: (Risk of anesthesia discussed in detail. I explained the risk of general anesthesia and the probability of prolonged ventilator support due to her pulmonary status and oxygen requirements. I discussed the possibility of spinal anesthetic similar to her last procedure and she politely declined due to prolonged numbness in 2013. Her last option of minimal sedation with paracervical block by Dr. Denman George was offered, she would like to proceed with this option. She understands the likelihood of discomfort during the procedure and is ok with proceeding.  )      Anesthesia Quick Evaluation

## 2016-08-09 NOTE — Anesthesia Postprocedure Evaluation (Signed)
Anesthesia Post Note  Patient: [name]  Procedure(s) Performed: Procedure(s) (LRB): DILATATION AND CURETTAGE (N/A) INTRAUTERINE DEVICE (IUD) INSERTION (N/A)  Patient location during evaluation: PACU Anesthesia Type: General Level of consciousness: awake and alert Pain management: pain level controlled Vital Signs Assessment: post-procedure vital signs reviewed and stable Respiratory status: spontaneous breathing, nonlabored ventilation, respiratory function stable and patient connected to nasal cannula oxygen Cardiovascular status: blood pressure returned to baseline and stable Postop Assessment: no signs of nausea or vomiting Anesthetic complications: no    Last Vitals:  Vitals:   08/09/16 1042 08/09/16 1139  BP: (!) 153/62 (!) 165/75  Pulse: 84 88  Resp: 18 18  Temp: 36.8 C 36.8 C    Last Pain:  Vitals:   08/09/16 1139  TempSrc: Oral  PainSc:                  Effie Berkshire

## 2016-08-12 ENCOUNTER — Telehealth: Payer: Self-pay | Admitting: *Deleted

## 2016-08-12 ENCOUNTER — Telehealth: Payer: Self-pay | Admitting: Gynecologic Oncology

## 2016-08-12 NOTE — Telephone Encounter (Signed)
Patient to be contacted by our office with her follow up appt with Dr. Carlis Abbott and to review appt for [place] on the 31.

## 2016-08-12 NOTE — Telephone Encounter (Signed)
Called and notified patient of scheduled appointment on 09/07/16 with Dr. Gillian Scarce. Pt also has a U/S appointment on 08/30/2016. Pt agreed with time and dates of appointments.

## 2016-08-18 ENCOUNTER — Ambulatory Visit (INDEPENDENT_AMBULATORY_CARE_PROVIDER_SITE_OTHER): Payer: Medicare Other | Admitting: Orthopaedic Surgery

## 2016-08-18 ENCOUNTER — Other Ambulatory Visit (HOSPITAL_BASED_OUTPATIENT_CLINIC_OR_DEPARTMENT_OTHER): Payer: Medicare Other

## 2016-08-18 DIAGNOSIS — M1712 Unilateral primary osteoarthritis, left knee: Secondary | ICD-10-CM | POA: Diagnosis not present

## 2016-08-18 DIAGNOSIS — M1711 Unilateral primary osteoarthritis, right knee: Secondary | ICD-10-CM | POA: Diagnosis not present

## 2016-08-18 DIAGNOSIS — D500 Iron deficiency anemia secondary to blood loss (chronic): Secondary | ICD-10-CM

## 2016-08-18 LAB — CBC & DIFF AND RETIC
BASO%: 0.1 % (ref 0.0–2.0)
BASOS ABS: 0 10*3/uL (ref 0.0–0.1)
EOS%: 0.3 % (ref 0.0–7.0)
Eosinophils Absolute: 0 10*3/uL (ref 0.0–0.5)
HEMATOCRIT: 37.4 % (ref 34.8–46.6)
HGB: 11.1 g/dL — ABNORMAL LOW (ref 11.6–15.9)
Immature Retic Fract: 6.9 % (ref 1.60–10.00)
LYMPH%: 8 % — ABNORMAL LOW (ref 14.0–49.7)
MCH: 24.6 pg — AB (ref 25.1–34.0)
MCHC: 29.7 g/dL — AB (ref 31.5–36.0)
MCV: 82.7 fL (ref 79.5–101.0)
MONO#: 0.7 10*3/uL (ref 0.1–0.9)
MONO%: 7.5 % (ref 0.0–14.0)
NEUT#: 7.8 10*3/uL — ABNORMAL HIGH (ref 1.5–6.5)
NEUT%: 84.1 % — AB (ref 38.4–76.8)
PLATELETS: 219 10*3/uL (ref 145–400)
RBC: 4.52 10*6/uL (ref 3.70–5.45)
RDW: 22.9 % — AB (ref 11.2–14.5)
RETIC %: 0.99 % (ref 0.70–2.10)
Retic Ct Abs: 44.75 10*3/uL (ref 33.70–90.70)
WBC: 9.3 10*3/uL (ref 3.9–10.3)
lymph#: 0.7 10*3/uL — ABNORMAL LOW (ref 0.9–3.3)

## 2016-08-18 LAB — FERRITIN: Ferritin: 30 ng/ml (ref 9–269)

## 2016-08-19 DIAGNOSIS — D500 Iron deficiency anemia secondary to blood loss (chronic): Secondary | ICD-10-CM | POA: Diagnosis not present

## 2016-08-19 DIAGNOSIS — Z79899 Other long term (current) drug therapy: Secondary | ICD-10-CM | POA: Diagnosis not present

## 2016-08-19 DIAGNOSIS — D860 Sarcoidosis of lung: Secondary | ICD-10-CM | POA: Diagnosis not present

## 2016-08-19 DIAGNOSIS — M064 Inflammatory polyarthropathy: Secondary | ICD-10-CM | POA: Diagnosis not present

## 2016-08-19 DIAGNOSIS — E662 Morbid (severe) obesity with alveolar hypoventilation: Secondary | ICD-10-CM | POA: Diagnosis not present

## 2016-08-19 DIAGNOSIS — Z23 Encounter for immunization: Secondary | ICD-10-CM | POA: Diagnosis not present

## 2016-08-19 DIAGNOSIS — M159 Polyosteoarthritis, unspecified: Secondary | ICD-10-CM | POA: Diagnosis not present

## 2016-08-25 ENCOUNTER — Ambulatory Visit (HOSPITAL_BASED_OUTPATIENT_CLINIC_OR_DEPARTMENT_OTHER): Payer: Medicare Other | Admitting: Hematology

## 2016-08-25 ENCOUNTER — Ambulatory Visit (HOSPITAL_BASED_OUTPATIENT_CLINIC_OR_DEPARTMENT_OTHER): Payer: Medicare Other

## 2016-08-25 VITALS — BP 144/86 | HR 77 | Temp 98.3°F | Resp 18

## 2016-08-25 VITALS — BP 158/88 | HR 77 | Temp 98.3°F | Resp 17 | Ht 66.0 in | Wt >= 6400 oz

## 2016-08-25 DIAGNOSIS — D500 Iron deficiency anemia secondary to blood loss (chronic): Secondary | ICD-10-CM

## 2016-08-25 DIAGNOSIS — N920 Excessive and frequent menstruation with regular cycle: Secondary | ICD-10-CM

## 2016-08-25 MED ORDER — SODIUM CHLORIDE 0.9 % IV SOLN
Freq: Once | INTRAVENOUS | Status: AC
  Administered 2016-08-25: 12:00:00 via INTRAVENOUS

## 2016-08-25 MED ORDER — SODIUM CHLORIDE 0.9 % IV SOLN
510.0000 mg | Freq: Once | INTRAVENOUS | Status: AC
  Administered 2016-08-25: 510 mg via INTRAVENOUS
  Filled 2016-08-25: qty 17

## 2016-08-25 NOTE — Patient Instructions (Signed)

## 2016-08-25 NOTE — Progress Notes (Signed)
[place]  Telephone:([phone] Fax:(336) [phone]  Clinic Follow Up Note   Patient Care Team: [name], NP as PCP - General (Family Medicine) Wonda Horner, MD (Gastroenterology) Rigoberto Noel, MD as Consulting Physician (Pulmonary Disease) 08/25/2016  CHIEF COMPLAINTS:  Follow up anemia   HISTORY OF PRESENTING ILLNESS (06/23/2016):  [name] 50 y.o. female with past medical history of sarcoidosis, on chronic continuous oxygen 2 L/min, morbid obesity, congestive heart failure, here because of her anemia. She was referred by her PCP.   She has had anemia since she was in her 89's. She has very heavy menstrual period, the last about 7 days, she has to change a pad 5-6 times a day, and she contributes her anemia to her heavy period. She underwent endometrial ablation once in the past, which did not work. She would like to have a hysterectomy, which has not been offered by her gynecologist. She has been taking iron pill one or twice a day, but not very compliant. Her anemia has been getting worse lately, she was admitted to hospital twice for severe anemia in the past year, and received blood transfusion and IV feraheme in 12/2014 and 06/17/2016. She has been following with her PCP for her anemia.   She denies recent chest pain on exertion, shortness of breath on minimal exertion, pre-syncopal episodes, or palpitations. She had not noticed any other recent bleeding such as epistaxis, hematuria or hematochezia The patient denies over the counter NSAID ingestion. She is not on antiplatelets agents. She had no prior history or diagnosis of cancer. Her age appropriate screening programs are up-to-date. She denies any pica and eats a variety of diet. She never donated blood or received blood transfusion  CURRENT THERAPY: iv Feraheme 510mg  as needed, received on 01/16/2015, 06/17/2016, 07/01/2016  INTERIM HISTORY: She returns for follow up. She underwent I&D and  IUD palcement on 08/10/16 and tolerated the procedure well, she has not had menstrual period since then. She otherwise is clinically stable, remains to be on oxygen, mild fatigue and overall stable, no other new symptoms.   MEDICAL HISTORY:  Past Medical History:  Diagnosis Date  . Anemia   . Arthritis    hands, shoulders, no meds  . CHF (congestive heart failure) (HCC)    EF60-65%  . Chronic hyperventilation syndrome    w/ obesity tx with albuterol inhaler and oxygen 2L  . COPD (chronic obstructive pulmonary disease) ([place])    uses oxygen 2 L  . Gallstones   . GERD (gastroesophageal reflux disease)    diet controlled - no meds  . H/O hiatal hernia   . Hypertension   . Hypothyroidism   . Kidney stones   . Morbid obesity ([place])   . Pneumonia    hoispitalized in 08/2011  . Sarcoidosis ([place])   . Seasonal allergies   . Shortness of breath    with exertion   . Sleep apnea    uses CPAP machine     SURGICAL HISTORY: Past Surgical History:  Procedure Laterality Date  . [place]   x 1  . CHOLECYSTECTOMY  2000  . DILATION AND CURETTAGE OF UTERUS N/A 08/09/2016   Procedure: DILATATION AND CURETTAGE;  Surgeon: Everitt Amber, MD;  Location: [facility];  Service: Gynecology;  Laterality: N/A;  . HYSTEROSCOPY W/D&C  12/27/2011   Procedure: DILATATION AND CURETTAGE /HYSTEROSCOPY;  Surgeon: Maeola Sarah. Landry Mellow, MD;  Location: [facility];  Service: Gynecology;;  . I and D  of abcess  05/2011  . INTRAUTERINE DEVICE (IUD) INSERTION N/A 08/09/2016   Procedure: INTRAUTERINE DEVICE (IUD) INSERTION;  Surgeon: Everitt Amber, MD;  Location: [facility];  Service: Gynecology;  Laterality: N/A;  . LUNG BIOPSY    . uterine abletion      SOCIAL HISTORY: Social History   Social History  . Marital status: Single    Spouse name: N/A  . Number of children: N/A  . Years of education: N/A   Occupational History  . unemployeed    Social History Main Topics  . Smoking status: Never Smoker  . Smokeless  tobacco: Never Used  . Alcohol use Yes     Comment: occasionally  . Drug use: No  . Sexual activity: No   Other Topics Concern  . Not on file   Social History Narrative   Is not currently working.    Divorced for eight or nine years   Has one son who lives around here.        FAMILY HISTORY: Family History  Problem Relation Age of Onset  . Diabetes Father   . Cancer Father 47    colon cancer   . Diabetes Brother   . Deep vein thrombosis Mother   . Aneurysm Sister     d/o brain aneurysm    ALLERGIES:  has No Known Allergies.  MEDICATIONS:  Current Outpatient Prescriptions  Medication Sig Dispense Refill  . albuterol (PROVENTIL HFA;VENTOLIN HFA) 108 (90 Base) MCG/ACT inhaler Inhale 1-2 puffs into the lungs every 6 (six) hours as needed for wheezing. 1 Inhaler 0  . cetirizine (ZYRTEC) 10 MG tablet Take 10 mg by mouth at bedtime as needed for allergies.    . cholecalciferol (VITAMIN D) 400 UNITS TABS tablet Take 400 Units by mouth daily.    Marland Kitchen Coral Calcium 1000 (390 CA) MG TABS Take 1,000 mg by mouth daily.     . ferrous sulfate 325 (65 FE) MG tablet Take 1 tablet (325 mg total) by mouth 3 (three) times daily with meals. [id number] tablet 11  . folic acid (FOLVITE) 1 MG tablet Take 1 mg by mouth daily.  3  . furosemide (LASIX) 20 MG tablet Take 3 tablets (60 mg total) by mouth 2 (two) times daily. (Patient taking differently: Take 60 mg by mouth daily. ) 60 tablet 0  . HYDROcodone-acetaminophen (NORCO) 10-325 MG tablet Take 1 tablet by mouth every 6 (six) hours as needed (pain).    Marland Kitchen ibuprofen (ADVIL,MOTRIN) 800 MG tablet Take 1 tablet (800 mg total) by mouth every 8 (eight) hours as needed. 30 tablet 0  . levonorgestrel (MIRENA) 20 MCG/24HR IUD 1 Intra Uterine Device (1 each total) by Intrauterine route once. 1 each 0  . levothyroxine (SYNTHROID, LEVOTHROID) 100 MCG tablet Take 1 tablet (100 mcg total) by mouth daily at 6 (six) AM. 30 tablet 4  . losartan (COZAAR) 50 MG tablet TAKE  1 TABLET (50 MG TOTAL) BY MOUTH DAILY. 30 tablet 5  . methotrexate 50 MG/2ML injection Inject 15 mg into the skin every Friday. Injects 0.74ml.  3  . metoprolol succinate (TOPROL-XL) 25 MG 24 hr tablet TAKE 1 TABLET (25 MG TOTAL) BY MOUTH DAILY. 30 tablet 5  . NON FORMULARY 2 liter of oxygen    . predniSONE (DELTASONE) 10 MG tablet Take 10 mg by mouth daily.  3  . triamcinolone acetonide (KENALOG) 40 MG/ML injection Inject 40 mg into the muscle every 3 (three) months. For knee pain    .  triamcinolone ointment (KENALOG) 0.1 % Apply 1 application topically See admin instructions. Applies twice a day as needed for break out of Sarcoidosis    . vitamin B-12 1000 MCG tablet Take 1 tablet (1,000 mcg total) by mouth daily. 30 tablet 0   No current facility-administered medications for this visit.     REVIEW OF SYSTEMS:   Constitutional: Denies fevers, chills or abnormal night sweats Eyes: Denies blurriness of vision, double vision or watery eyes Ears, nose, mouth, throat, and face: Denies mucositis or sore throat Respiratory: Denies cough, dyspnea or wheezes Cardiovascular: Denies palpitation, chest discomfort or lower extremity swelling Gastrointestinal:  Denies nausea, heartburn or change in bowel habits Skin: Denies abnormal skin rashes Lymphatics: Denies new lymphadenopathy or easy bruising Neurological:Denies numbness, tingling or new weaknesses Behavioral/Psych: Mood is stable, no new changes  All other systems were reviewed with the patient and are negative.  PHYSICAL EXAMINATION: ECOG PERFORMANCE STATUS: 1 - Symptomatic but completely ambulatory  Vitals:   08/25/16 1102  BP: (!) 158/88  Pulse: 77  Resp: 17  Temp: 98.3 F (36.8 C)   Filed Weights   08/25/16 1102  Weight: (!) 495 lb (224.5 kg)    GENERAL:alert, no distress and comfortable, Morbidly obese female, on nasal cannula oxygen SKIN: skin color, texture, turgor are normal, no rashes or significant lesions EYES:  normal, conjunctiva are pink and non-injected, sclera clear OROPHARYNX:no exudate, no erythema and lips, buccal mucosa, and tongue normal  NECK: supple, thyroid normal size, non-tender, without nodularity LYMPH:  no palpable lymphadenopathy in the cervical, axillary or inguinal LUNGS: clear to auscultation and percussion with normal breathing effort HEART: regular rate & rhythm and no murmurs and no lower extremity edema ABDOMEN:abdomen soft, non-tender and normal bowel sounds Musculoskeletal:no cyanosis of digits and no clubbing  PSYCH: alert & oriented x 3 with fluent speech NEURO: no focal motor/sensory deficits  LABORATORY DATA:  I have reviewed the data as listed CBC Latest Ref Rng & Units 08/18/2016 07/28/2016 07/21/2016  WBC 3.9 - 10.3 10e3/uL 9.3 6.0 8.0  Hemoglobin 11.6 - 15.9 g/dL 11.1(L) 12.1 11.5(L)  Hematocrit 34.8 - 46.6 % 37.4 41.4 37.8  Platelets 145 - 400 10e3/uL 219 221 253    CMP Latest Ref Rng & Units 07/28/2016 06/21/2016 06/19/2016  Glucose 65 - 99 mg/dL 126(H) 108(H) 122(H)  BUN 6 - 20 mg/dL 15 12 13   Creatinine 0.44 - 1.00 mg/dL 0.72 0.69 0.59  Sodium 135 - 145 mmol/L 138 141 140  Potassium 3.5 - 5.1 mmol/L 4.2 4.1 4.3  Chloride 101 - 111 mmol/L 98(L) 98 100(L)  CO2 22 - 32 mmol/L 32 35(H) 32  Calcium 8.9 - 10.3 mg/dL 9.2 8.9 8.4(L)  Total Protein 6.5 - 8.1 g/dL - - -  Total Bilirubin 0.3 - 1.2 mg/dL - - -  Alkaline Phos 38 - 126 U/L - - -  AST 15 - 41 U/L - - -  ALT 14 - 54 U/L - - -   Results for ANOUSH, PLAGMAN (MRN [id number]) as of 08/25/2016 06:47  Ref. Range 06/16/2016 07:49 07/21/2016 11:08 08/18/2016 11:57  Iron Latest Ref Range: 41 - 142 ug/dL 15 (L) 35 (L)   UIBC Latest Ref Range: 120 - 384 ug/dL 362 228   TIBC Latest Ref Range: 236 - 444 ug/dL 377 263   %SAT Latest Ref Range: 21 - 57 %  13 (L)   Saturation Ratios Latest Ref Range: 10.4 - 31.8 % 4 (L)    Ferritin Latest  Ref Range: 9 - 269 ng/ml 10 (L) 87 30  Folate, Hemolysate Latest Ref  Range: Not Estab. ng/mL 542.5    Folate, RBC Latest Ref Range: >498 ng/mL 1,852      RADIOGRAPHIC STUDIES: I have personally reviewed the radiological images as listed and agreed with the findings in the report. Dg Chest 2 View  Result Date: 07/28/2016 CLINICAL DATA:  Preop.  History of sarcoidosis.  Hypertension. EXAM: CHEST  2 VIEW COMPARISON:  06/15/2016 FINDINGS: The heart is mildly enlarged. Prominence of pulmonary markings is accentuated by patient body habitus. There are no focal consolidations or pleural effusions. No definite pulmonary edema. IMPRESSION: 1. Stable cardiomegaly. 2. Prominent interstitial markings are probably stable. No focal acute pulmonary abnormality. Electronically Signed   By: Nolon Nations M.D.   On: 07/28/2016 09:01    ASSESSMENT & PLAN:  50 year old premenopause female, presented with chronic anemia, heavy menstrual periods, and worsening anemia lately, and required hospitalization and blood transfusions.  1. Iron deficient anemia from chronic blood loss -Her multiple iron study has showed very low level of ferritin, serum iron level and transferrin saturation, her MCV is low, consistent with iron deficient anemia. -Her iron deficiency is likely from her menorrhagia -She is not very compliant with oral iron supplement, responded well to IV iron -Her anemia resolved after IV iron, but her hemoglobin dropped again, along with a dropping of ferritin to 30 last week -I recommend her to have IV iron today and next week -She is status post I&D and IT placement, her menstrual period will be much lighter or stop, and I anticipate her iron deficiency would also much improved  2. Hypertension, pulmonary sarcomatosis, congestive heart failure morbid obesity -She'll continue follow-up with her primary care physician and other doctors   Plan  -IV Feraheme today and next week -Repeat CBC and ferritin in 2 months. She will call us the day after her lab work, to see if  she needs IV Feraheme (if ferritin<50) or blood (if Hb<8.0)  -I'll see her back in 4 months with lab one week before   All questions were answered. The patient knows to call the clinic with any problems, questions or concerns. I spent 15 minutes counseling the patient face to face. The total time spent in the appointment was 20 minutes and more than 50% was on counseling.     Truitt Merle, MD 08/25/16

## 2016-08-28 ENCOUNTER — Encounter: Payer: Self-pay | Admitting: Hematology

## 2016-08-30 ENCOUNTER — Ambulatory Visit (HOSPITAL_COMMUNITY): Payer: Medicare Other

## 2016-08-31 ENCOUNTER — Ambulatory Visit (HOSPITAL_COMMUNITY)
Admission: RE | Admit: 2016-08-31 | Discharge: 2016-08-31 | Disposition: A | Discharge status: Home / Self Care | Payer: Medicare Other | Source: Ambulatory Visit | Attending: Gynecologic Oncology | Admitting: Gynecologic Oncology

## 2016-08-31 DIAGNOSIS — Z975 Presence of (intrauterine) contraceptive device: Secondary | ICD-10-CM | POA: Diagnosis not present

## 2016-08-31 DIAGNOSIS — N852 Hypertrophy of uterus: Secondary | ICD-10-CM | POA: Insufficient documentation

## 2016-08-31 DIAGNOSIS — Z30430 Encounter for insertion of intrauterine contraceptive device: Secondary | ICD-10-CM | POA: Diagnosis not present

## 2016-09-05 ENCOUNTER — Ambulatory Visit: Payer: Medicare Other

## 2016-09-06 ENCOUNTER — Telehealth: Payer: Self-pay | Admitting: Hematology

## 2016-09-06 NOTE — Progress Notes (Signed)
GYNECOLOGIC ONCOLOGY RETURN VISIT  09/07/16  REASON FOR VISIT: Follow up  ASSESSMENT AND PLAN: [name] is a 50 y.o. woman with abnormal uterine bleeding who presents for follow up after D&C with IUD placement.  She is without complaint. No acute postop issues. Her pathology was reviewed. Discussed the use of Mirena IUD for 5 years for endometrial protection. She will need to establish care with a benign gynecologist moving forward and she is given names of gynecologists in the area. She will follow up in our clinic PRN  Gillian Scarce, MD  HPI: [name] is a 50 y.o. woman with abnormal bleeding who presents for postop follow up.  TREATMENT HISTORY: Presented for AUB and had failed office sampling. Underwent D&C with IUD on 10/12 with Dr. Denman George.  INTERVAL HISTORY: Since surgery she has had some intermittent bleeding. She underwent an ultrasound showing the IUD In the lower uterus and no other mass or concern. She is overall doing well. No complaints. On her baseline home oxygen. No new chest pain or SOB. She is currently having very light bleeding.  PAST MEDICAL/SURGICAL HX: Past Medical History:  Diagnosis Date  . Anemia   . Arthritis    hands, shoulders, no meds  . CHF (congestive heart failure) (HCC)    EF60-65%  . Chronic hyperventilation syndrome    w/ obesity tx with albuterol inhaler and oxygen 2L  . COPD (chronic obstructive pulmonary disease) ([place])    uses oxygen 2 L  . Gallstones   . GERD (gastroesophageal reflux disease)    diet controlled - no meds  . H/O hiatal hernia   . Hypertension   . Hypothyroidism   . Kidney stones   . Morbid obesity ([place])   . Pneumonia    hoispitalized in 08/2011  . Sarcoidosis ([place])   . Seasonal allergies   . Shortness of breath    with exertion   . Sleep apnea    uses CPAP machine     Past Surgical History:  Procedure Laterality Date  . [place]   x 1  . CHOLECYSTECTOMY  2000  .  DILATION AND CURETTAGE OF UTERUS N/A 08/09/2016   Procedure: DILATATION AND CURETTAGE;  Surgeon: Everitt Amber, MD;  Location: [facility];  Service: Gynecology;  Laterality: N/A;  . HYSTEROSCOPY W/D&C  12/27/2011   Procedure: DILATATION AND CURETTAGE /HYSTEROSCOPY;  Surgeon: Maeola Sarah. Landry Mellow, MD;  Location: [facility];  Service: Gynecology;;  . I and D of abcess  05/2011  . INTRAUTERINE DEVICE (IUD) INSERTION N/A 08/09/2016   Procedure: INTRAUTERINE DEVICE (IUD) INSERTION;  Surgeon: Everitt Amber, MD;  Location: [facility];  Service: Gynecology;  Laterality: N/A;  . LUNG BIOPSY    . uterine abletion      Family History  Problem Relation Age of Onset  . Diabetes Father   . Cancer Father 59    colon cancer   . Diabetes Brother   . Deep vein thrombosis Mother   . Aneurysm Sister     d/o brain aneurysm    Social History   Social History  . Marital status: Single    Spouse name: N/A  . Number of children: N/A  . Years of education: N/A   Occupational History  . unemployeed    Social History Main Topics  . Smoking status: Never Smoker  . Smokeless tobacco: Never Used  . Alcohol use Yes     Comment: occasionally  . Drug use: No  . Sexual  activity: No   Other Topics Concern  . None   Social History Narrative   Is not currently working.    Divorced for eight or nine years   Has one son who lives around here.        CURRENT MEDICATIONS:  Current Outpatient Prescriptions:  .  albuterol (PROVENTIL HFA;VENTOLIN HFA) 108 (90 Base) MCG/ACT inhaler, Inhale 1-2 puffs into the lungs every 6 (six) hours as needed for wheezing., Disp: 1 Inhaler, Rfl: 0 .  cetirizine (ZYRTEC) 10 MG tablet, Take 10 mg by mouth at bedtime as needed for allergies., Disp: , Rfl:  .  cholecalciferol (VITAMIN D) 400 UNITS TABS tablet, Take 400 Units by mouth daily., Disp: , Rfl:  .  Coral Calcium 1000 (390 CA) MG TABS, Take 1,000 mg by mouth daily. , Disp: , Rfl:  .  ferrous sulfate 325 (65 FE) MG tablet, Take 1 tablet (325  mg total) by mouth 3 (three) times daily with meals., Disp: 270 tablet, Rfl: 11 .  folic acid (FOLVITE) 1 MG tablet, Take 1 mg by mouth daily., Disp: , Rfl: 3 .  furosemide (LASIX) 20 MG tablet, Take 3 tablets (60 mg total) by mouth 2 (two) times daily. (Patient taking differently: Take 60 mg by mouth daily. ), Disp: 60 tablet, Rfl: 0 .  HYDROcodone-acetaminophen (NORCO) 10-325 MG tablet, Take 1 tablet by mouth every 6 (six) hours as needed (pain)., Disp: , Rfl:  .  levothyroxine (SYNTHROID, LEVOTHROID) 100 MCG tablet, Take 1 tablet (100 mcg total) by mouth daily at 6 (six) AM., Disp: 30 tablet, Rfl: 4 .  losartan (COZAAR) 50 MG tablet, TAKE 1 TABLET (50 MG TOTAL) BY MOUTH DAILY., Disp: 30 tablet, Rfl: 5 .  methotrexate 50 MG/2ML injection, Inject 15 mg into the skin every Friday. Injects 0.35ml., Disp: , Rfl: 3 .  metoprolol succinate (TOPROL-XL) 25 MG 24 hr tablet, TAKE 1 TABLET (25 MG TOTAL) BY MOUTH DAILY., Disp: 30 tablet, Rfl: 5 .  NON FORMULARY, 2 liter of oxygen, Disp: , Rfl:  .  predniSONE (DELTASONE) 10 MG tablet, Take 10 mg by mouth daily., Disp: , Rfl: 3 .  triamcinolone acetonide (KENALOG) 40 MG/ML injection, Inject 40 mg into the muscle every 3 (three) months. For knee pain, Disp: , Rfl:  .  triamcinolone ointment (KENALOG) 0.1 %, Apply 1 application topically See admin instructions. Applies twice a day as needed for break out of Sarcoidosis, Disp: , Rfl:  .  vitamin B-12 1000 MCG tablet, Take 1 tablet (1,000 mcg total) by mouth daily., Disp: 30 tablet, Rfl: 0 .  ibuprofen (ADVIL,MOTRIN) 800 MG tablet, Take 1 tablet (800 mg total) by mouth every 8 (eight) hours as needed. (Patient not taking: Reported on 09/07/2016), Disp: 30 tablet, Rfl: 0 .  levonorgestrel (MIRENA) 20 MCG/24HR IUD, 1 Intra Uterine Device (1 each total) by Intrauterine route once., Disp: 1 each, Rfl: 0  REVIEW OF SYSTEMS: Complete 10-system review is negative except as above in Interval History.  PHYSICAL EXAM: BP  (!) 148/73 (BP Location: Left Wrist, Patient Position: Sitting)   Pulse 80   Temp 98.7 F (37.1 C) (Oral)   Resp 17   Ht 5\' 6"  (1.676 m)   Wt (!) 490 lb 1.6 oz (222.3 kg)   SpO2 99%   BMI 79.10 kg/m  General: Alert, oriented, no acute distress. HEENT: Posterior oropharynx clear, sclera anicteric. Chest: Clear to auscultation bilaterally.   Cardiovascular: Regular rate and rhythm, no murmurs. Abdomen: Obese, soft, nontender.  Extremities: Grossly normal range of motion.  Warm, well perfused.  Skin: No rashes or lesions noted. GU: Patient declines as she is menstruating.  LABORATORY AND RADIOLOGIC STUDIES: Diagnosis Endometrium, biopsy - DEGENERATING SECRETORY-TYPE ENDOMETRIUM. - THERE IS NO EVIDENCE OF HYPERPLASIA OR MALIGNANCY. - SEE COMMENT. Microscopic Comment This pattern can be associated with menses, irregular shedding or breakthrough bleeding associated with hormonal therapy. Clinical correlation.

## 2016-09-06 NOTE — Telephone Encounter (Signed)
Spoke with patient re appointment for 11/8 and the following week. Patient will get new schedule 11/8.

## 2016-09-07 ENCOUNTER — Ambulatory Visit: Payer: Medicare Other | Attending: Gynecologic Oncology | Admitting: Gynecologic Oncology

## 2016-09-07 ENCOUNTER — Encounter: Payer: Self-pay | Admitting: Gynecologic Oncology

## 2016-09-07 ENCOUNTER — Ambulatory Visit (HOSPITAL_BASED_OUTPATIENT_CLINIC_OR_DEPARTMENT_OTHER): Payer: Medicare Other

## 2016-09-07 VITALS — BP 151/68 | HR 70 | Temp 98.4°F

## 2016-09-07 VITALS — BP 148/73 | HR 80 | Temp 98.7°F | Resp 17 | Ht 66.0 in | Wt >= 6400 oz

## 2016-09-07 DIAGNOSIS — N920 Excessive and frequent menstruation with regular cycle: Secondary | ICD-10-CM

## 2016-09-07 DIAGNOSIS — I509 Heart failure, unspecified: Secondary | ICD-10-CM | POA: Insufficient documentation

## 2016-09-07 DIAGNOSIS — F458 Other somatoform disorders: Secondary | ICD-10-CM | POA: Diagnosis not present

## 2016-09-07 DIAGNOSIS — Z87442 Personal history of urinary calculi: Secondary | ICD-10-CM | POA: Diagnosis not present

## 2016-09-07 DIAGNOSIS — K219 Gastro-esophageal reflux disease without esophagitis: Secondary | ICD-10-CM | POA: Insufficient documentation

## 2016-09-07 DIAGNOSIS — J449 Chronic obstructive pulmonary disease, unspecified: Secondary | ICD-10-CM | POA: Insufficient documentation

## 2016-09-07 DIAGNOSIS — E039 Hypothyroidism, unspecified: Secondary | ICD-10-CM | POA: Diagnosis not present

## 2016-09-07 DIAGNOSIS — Z975 Presence of (intrauterine) contraceptive device: Secondary | ICD-10-CM | POA: Insufficient documentation

## 2016-09-07 DIAGNOSIS — Z833 Family history of diabetes mellitus: Secondary | ICD-10-CM | POA: Insufficient documentation

## 2016-09-07 DIAGNOSIS — N939 Abnormal uterine and vaginal bleeding, unspecified: Secondary | ICD-10-CM | POA: Diagnosis not present

## 2016-09-07 DIAGNOSIS — I110 Hypertensive heart disease with heart failure: Secondary | ICD-10-CM | POA: Diagnosis not present

## 2016-09-07 DIAGNOSIS — Z9981 Dependence on supplemental oxygen: Secondary | ICD-10-CM | POA: Diagnosis not present

## 2016-09-07 DIAGNOSIS — Z6841 Body Mass Index (BMI) 40.0 and over, adult: Secondary | ICD-10-CM | POA: Diagnosis not present

## 2016-09-07 DIAGNOSIS — Z9889 Other specified postprocedural states: Secondary | ICD-10-CM | POA: Insufficient documentation

## 2016-09-07 DIAGNOSIS — D500 Iron deficiency anemia secondary to blood loss (chronic): Secondary | ICD-10-CM | POA: Diagnosis not present

## 2016-09-07 DIAGNOSIS — Z800 Family history of malignant neoplasm of digestive organs: Secondary | ICD-10-CM | POA: Insufficient documentation

## 2016-09-07 DIAGNOSIS — M1990 Unspecified osteoarthritis, unspecified site: Secondary | ICD-10-CM | POA: Insufficient documentation

## 2016-09-07 DIAGNOSIS — Z9049 Acquired absence of other specified parts of digestive tract: Secondary | ICD-10-CM | POA: Insufficient documentation

## 2016-09-07 DIAGNOSIS — D869 Sarcoidosis, unspecified: Secondary | ICD-10-CM | POA: Diagnosis not present

## 2016-09-07 DIAGNOSIS — D649 Anemia, unspecified: Secondary | ICD-10-CM | POA: Diagnosis not present

## 2016-09-07 DIAGNOSIS — G4730 Sleep apnea, unspecified: Secondary | ICD-10-CM | POA: Diagnosis not present

## 2016-09-07 MED ORDER — SODIUM CHLORIDE 0.9 % IV SOLN
510.0000 mg | Freq: Once | INTRAVENOUS | Status: AC
  Administered 2016-09-07: 510 mg via INTRAVENOUS
  Filled 2016-09-07: qty 17

## 2016-09-07 MED ORDER — SODIUM CHLORIDE 0.9 % IV SOLN
Freq: Once | INTRAVENOUS | Status: AC
  Administered 2016-09-07: 13:00:00 via INTRAVENOUS

## 2016-09-07 NOTE — Patient Instructions (Signed)

## 2016-09-07 NOTE — Patient Instructions (Signed)
Plan to follow up with a Gynecologist in one year for follow up.  Recommendations include: Dr. Uvaldo Rising at [facility] at [phone]. Dr. Bobbye Charleston at [phone].

## 2016-09-12 ENCOUNTER — Ambulatory Visit: Payer: Medicare Other

## 2016-09-14 ENCOUNTER — Other Ambulatory Visit: Payer: Self-pay | Admitting: Hematology

## 2016-09-14 ENCOUNTER — Ambulatory Visit (HOSPITAL_BASED_OUTPATIENT_CLINIC_OR_DEPARTMENT_OTHER): Payer: Medicare Other

## 2016-09-14 ENCOUNTER — Ambulatory Visit: Payer: Medicare Other

## 2016-09-14 VITALS — BP 144/63 | HR 72 | Temp 98.5°F

## 2016-09-14 DIAGNOSIS — D500 Iron deficiency anemia secondary to blood loss (chronic): Secondary | ICD-10-CM

## 2016-09-14 DIAGNOSIS — N920 Excessive and frequent menstruation with regular cycle: Secondary | ICD-10-CM | POA: Diagnosis present

## 2016-09-14 LAB — CBC WITH DIFFERENTIAL/PLATELET
BASO%: 0.2 % (ref 0.0–2.0)
Basophils Absolute: 0 10*3/uL (ref 0.0–0.1)
EOS ABS: 0 10*3/uL (ref 0.0–0.5)
EOS%: 0.2 % (ref 0.0–7.0)
HEMATOCRIT: 41.3 % (ref 34.8–46.6)
HGB: 12.6 g/dL (ref 11.6–15.9)
LYMPH%: 8.8 % — AB (ref 14.0–49.7)
MCH: 25.7 pg (ref 25.1–34.0)
MCHC: 30.5 g/dL — AB (ref 31.5–36.0)
MCV: 84.1 fL (ref 79.5–101.0)
MONO#: 0.5 10*3/uL (ref 0.1–0.9)
MONO%: 7.6 % (ref 0.0–14.0)
NEUT%: 83.2 % — ABNORMAL HIGH (ref 38.4–76.8)
NEUTROS ABS: 5.3 10*3/uL (ref 1.5–6.5)
NRBC: 0 % (ref 0–0)
PLATELETS: 236 10*3/uL (ref 145–400)
RBC: 4.91 10*6/uL (ref 3.70–5.45)
RDW: 20.3 % — AB (ref 11.2–14.5)
WBC: 6.3 10*3/uL (ref 3.9–10.3)
lymph#: 0.6 10*3/uL — ABNORMAL LOW (ref 0.9–3.3)

## 2016-09-14 LAB — FERRITIN: Ferritin: 231 ng/ml (ref 9–269)

## 2016-09-14 NOTE — Patient Instructions (Signed)
No need for feraheme infusion today

## 2016-09-14 NOTE — Progress Notes (Signed)
Per Dr. Burr Medico pt does not need Ferritin infusion today, pt to have CBC and Ferratin labs drawn and no need for pt to wait for results, Lab aware. Pt aware and verbalizes understanding.

## 2016-10-06 ENCOUNTER — Telehealth: Payer: Self-pay | Admitting: *Deleted

## 2016-10-06 NOTE — Telephone Encounter (Signed)
Pt called stating that pt could see lab results done 09/14/16 at this clinic from [place].  Pt requested lab results to be faxed to her rheumatologist Dr. Berna Bue - att:  Judeen Hammans.  Pt stated she did not want duplicate labs to be done by Dr. Lenna Gilford.  Faxed lab results to [facility] as per pt's request.

## 2016-10-13 ENCOUNTER — Telehealth: Payer: Self-pay

## 2016-10-13 NOTE — Telephone Encounter (Signed)
Pt called to report that during her current period she passed a large clot and noted the IUD was present. She did report a severe amt of craming during the episode. Currently having normal amt of bleeding with her period. RN scheduled f/u appointment. Will discuss with Melissa Cross,NP.

## 2016-10-20 ENCOUNTER — Telehealth: Payer: Self-pay | Admitting: Hematology

## 2016-10-20 NOTE — Telephone Encounter (Signed)
Called patient regarding  Rescheduling her lab appointments, per message received from [place]. Left message on voice mail for patient to call to reschedule her appointment,per day she will have transportation. 10/20/16

## 2016-10-21 ENCOUNTER — Other Ambulatory Visit: Payer: Medicare Other

## 2016-10-25 ENCOUNTER — Telehealth: Payer: Self-pay

## 2016-10-25 NOTE — Telephone Encounter (Signed)
Per Dr Denman George, pt needs to follow up with her standard GYN. Pt verbalized understanding.

## 2016-11-16 ENCOUNTER — Ambulatory Visit (INDEPENDENT_AMBULATORY_CARE_PROVIDER_SITE_OTHER): Payer: Medicare Other | Admitting: Orthopaedic Surgery

## 2016-11-16 ENCOUNTER — Other Ambulatory Visit: Payer: Medicare Other

## 2016-11-21 ENCOUNTER — Encounter (INDEPENDENT_AMBULATORY_CARE_PROVIDER_SITE_OTHER): Payer: Self-pay | Admitting: Physician Assistant

## 2016-11-21 ENCOUNTER — Other Ambulatory Visit (HOSPITAL_BASED_OUTPATIENT_CLINIC_OR_DEPARTMENT_OTHER): Payer: Medicare Other

## 2016-11-21 ENCOUNTER — Ambulatory Visit (INDEPENDENT_AMBULATORY_CARE_PROVIDER_SITE_OTHER): Payer: Medicare Other | Admitting: Physician Assistant

## 2016-11-21 DIAGNOSIS — D500 Iron deficiency anemia secondary to blood loss (chronic): Secondary | ICD-10-CM | POA: Diagnosis not present

## 2016-11-21 DIAGNOSIS — D860 Sarcoidosis of lung: Secondary | ICD-10-CM | POA: Diagnosis not present

## 2016-11-21 DIAGNOSIS — E662 Morbid (severe) obesity with alveolar hypoventilation: Secondary | ICD-10-CM | POA: Diagnosis not present

## 2016-11-21 DIAGNOSIS — N920 Excessive and frequent menstruation with regular cycle: Secondary | ICD-10-CM

## 2016-11-21 DIAGNOSIS — Z79899 Other long term (current) drug therapy: Secondary | ICD-10-CM | POA: Diagnosis not present

## 2016-11-21 DIAGNOSIS — M064 Inflammatory polyarthropathy: Secondary | ICD-10-CM | POA: Diagnosis not present

## 2016-11-21 DIAGNOSIS — M159 Polyosteoarthritis, unspecified: Secondary | ICD-10-CM | POA: Diagnosis not present

## 2016-11-21 DIAGNOSIS — Z6841 Body Mass Index (BMI) 40.0 and over, adult: Secondary | ICD-10-CM | POA: Diagnosis not present

## 2016-11-21 DIAGNOSIS — R21 Rash and other nonspecific skin eruption: Secondary | ICD-10-CM | POA: Diagnosis not present

## 2016-11-21 DIAGNOSIS — M170 Bilateral primary osteoarthritis of knee: Secondary | ICD-10-CM | POA: Diagnosis not present

## 2016-11-21 LAB — FERRITIN: Ferritin: 55 ng/ml (ref 9–269)

## 2016-11-21 LAB — CBC & DIFF AND RETIC
BASO%: 0 % (ref 0.0–2.0)
Basophils Absolute: 0 10*3/uL (ref 0.0–0.1)
EOS ABS: 0 10*3/uL (ref 0.0–0.5)
EOS%: 0.1 % (ref 0.0–7.0)
HEMATOCRIT: 39.3 % (ref 34.8–46.6)
HEMOGLOBIN: 12.3 g/dL (ref 11.6–15.9)
IMMATURE RETIC FRACT: 4.6 % (ref 1.60–10.00)
LYMPH%: 8 % — AB (ref 14.0–49.7)
MCH: 26.7 pg (ref 25.1–34.0)
MCHC: 31.3 g/dL — ABNORMAL LOW (ref 31.5–36.0)
MCV: 85.4 fL (ref 79.5–101.0)
MONO#: 0.6 10*3/uL (ref 0.1–0.9)
MONO%: 7.9 % (ref 0.0–14.0)
NEUT%: 84 % — ABNORMAL HIGH (ref 38.4–76.8)
NEUTROS ABS: 5.9 10*3/uL (ref 1.5–6.5)
PLATELETS: 204 10*3/uL (ref 145–400)
RBC: 4.6 10*6/uL (ref 3.70–5.45)
RDW: 17.8 % — ABNORMAL HIGH (ref 11.2–14.5)
Retic %: 1.16 % (ref 0.70–2.10)
Retic Ct Abs: 53.36 10*3/uL (ref 33.70–90.70)
WBC: 7 10*3/uL (ref 3.9–10.3)
lymph#: 0.6 10*3/uL — ABNORMAL LOW (ref 0.9–3.3)

## 2016-11-21 LAB — TECHNOLOGIST REVIEW

## 2016-11-21 LAB — IRON AND TIBC
%SAT: 15 % — AB (ref 21–57)
Iron: 46 ug/dL (ref 41–142)
TIBC: 299 ug/dL (ref 236–444)
UIBC: 253 ug/dL (ref 120–384)

## 2016-11-21 MED ORDER — METHYLPREDNISOLONE ACETATE 40 MG/ML IJ SUSP
40.0000 mg | INTRAMUSCULAR | Status: AC | PRN
  Administered 2016-11-21: 40 mg via INTRA_ARTICULAR

## 2016-11-21 MED ORDER — LIDOCAINE HCL 1 % IJ SOLN
3.0000 mL | INTRAMUSCULAR | Status: AC | PRN
  Administered 2016-11-21: 3 mL

## 2016-11-21 MED ORDER — HYDROCODONE-ACETAMINOPHEN 10-325 MG PO TABS: 1.0000 | ORAL_TABLET | Freq: Four times a day (QID) | ORAL | 0 refills | Status: DC | PRN

## 2016-11-21 NOTE — Progress Notes (Signed)
Office Visit Note   Patient: [name]           Date of Birth: 12-03-1965           MRN: [id number] Visit Date: 11/21/2016              Requested by: [name], NP [address], [place] [address] PCP: [name], NP   Assessment & Plan: Visit Diagnosis:  1. Primary osteoarthritis of both knees     Plan:Follow-up in 3 months for bilateral knee injections. Spoke with her about pain medications and she may need to get these from her rheumatologist. Explained to her that we can only give her 5 days worth narcotics.  Follow-Up Instructions: Return in about 3 months (around 02/19/2017).   Orders:  No orders of the defined types were placed in this encounter.  Meds ordered this encounter  Medications  . HYDROcodone-acetaminophen (NORCO) 10-325 MG tablet    Sig: Take 1 tablet by mouth every 6 (six) hours as needed (pain).    Dispense:  30 tablet    Refill:  0      Procedures: Large Joint Inj Date/Time: 11/21/2016 4:55 PM Performed by: Pete Pelt Authorized by: Pete Pelt   Consent Given by:  Patient Indications:  Pain Location:  Knee Site:  L knee Needle Size:  22 G Approach:  Anterolateral Ultrasound Guidance: No   Fluoroscopic Guidance: No   Medications:  40 mg methylPREDNISolone acetate 40 MG/ML; 3 mL lidocaine 1 % Aspiration Attempted: No   Patient tolerance:  Patient tolerated the procedure well with no immediate complications Large Joint Inj Date/Time: 11/21/2016 4:55 PM Performed by: Pete Pelt Authorized by: Pete Pelt   Consent Given by:  Patient Indications:  Pain Location:  Knee Site:  R knee Needle Size:  22 G Approach:  Anterolateral Ultrasound Guidance: No   Fluoroscopic Guidance: No   Medications:  40 mg methylPREDNISolone acetate 40 MG/ML; 3 mL lidocaine 1 % Aspiration Attempted: No   Patient tolerance:  Patient tolerated the procedure well with no immediate  complications     Clinical Data: No additional findings.   Subjective: Chief Complaint  Patient presents with  . Right Knee - Follow-up  . Left Knee - Follow-up  . Follow-up    HPI  States that the last injection in October did help in about a week ago that her pain returned in both knees. He is asking for refill on pain medications.  Review of Systems   Objective: Vital Signs: There were no vitals taken for this visit.  Physical Exam  Ortho Exam Bilateral knees without abnormal warmth erythema. Overall good range of motion Specialty Comments:  No specialty comments available.  Imaging: No results found.   PMFS History: Patient Active Problem List   Diagnosis Date Noted  . Abnormal uterine bleeding 08/09/2016  . Diastolic dysfunction with acute on chronic heart failure ([place]) 06/17/2016  . Microcytic anemia 02/27/2016  . Abdominal pain 02/27/2016  . Chronic diastolic CHF (congestive heart failure) ([place]) 02/27/2016  . Hyperlipidemia 02/24/2016  . Chronic respiratory failure ([place]) 09/21/2015  . Absolute anemia   . Anxiety 04/14/2015  . Hypoxemia   . Congestive heart failure ([place])   . Symptomatic anemia 01/15/2015  . Hypothyroidism 01/15/2015  . Morbid obesity ([place]) 01/15/2015  . Fibroids 12/09/2013  . Hyperglycemia 09/16/2013  . Left knee pain 09/09/2012  . Sarcoidosis ([place]) 09/09/2012  . Iron deficiency anemia due to chronic blood  loss 09/09/2012  . Hypertension 02/25/2011  . Obesity hypoventilation syndrome ([place]) 02/14/2011  . OSA (obstructive sleep apnea) 02/10/2011   Past Medical History:  Diagnosis Date  . Anemia   . Arthritis    hands, shoulders, no meds  . CHF (congestive heart failure) (H[facility])    EF60-65%  . Chronic hyperventilation syndrome    w/ obesity tx with albuterol inhaler and oxygen 2L  . COPD (chronic obstructive pulmonary disease) ([place])    uses oxygen 2 L  . Gallstones   . GERD (gastroesophageal reflux disease)    diet  controlled - no meds  . H/O hiatal hernia   . Hypertension   . Hypothyroidism   . Kidney stones   . Morbid obesity ([place])   . Pneumonia    hoispitalized in 08/2011  . Sarcoidosis ([place])   . Seasonal allergies   . Shortness of breath    with exertion   . Sleep apnea    uses CPAP machine     Family History  Problem Relation Age of Onset  . Diabetes Father   . Cancer Father 57    colon cancer   . Diabetes Brother   . Deep vein thrombosis Mother   . Aneurysm Sister     d/o brain aneurysm    Past Surgical History:  Procedure Laterality Date  . [place]   x 1  . CHOLECYSTECTOMY  2000  . DILATION AND CURETTAGE OF UTERUS N/A 08/09/2016   Procedure: DILATATION AND CURETTAGE;  Surgeon: Everitt Amber, MD;  Location: [facility];  Service: Gynecology;  Laterality: N/A;  . HYSTEROSCOPY W/D&C  12/27/2011   Procedure: DILATATION AND CURETTAGE /HYSTEROSCOPY;  Surgeon: Maeola Sarah. Landry Mellow, MD;  Location: [facility];  Service: Gynecology;;  . I and D of abcess  05/2011  . INTRAUTERINE DEVICE (IUD) INSERTION N/A 08/09/2016   Procedure: INTRAUTERINE DEVICE (IUD) INSERTION;  Surgeon: Everitt Amber, MD;  Location: [facility];  Service: Gynecology;  Laterality: N/A;  . LUNG BIOPSY    . uterine abletion     Social History   Occupational History  . unemployeed    Social History Main Topics  . Smoking status: Never Smoker  . Smokeless tobacco: Never Used  . Alcohol use Yes     Comment: occasionally  . Drug use: No  . Sexual activity: No

## 2016-11-22 ENCOUNTER — Telehealth: Payer: Self-pay | Admitting: *Deleted

## 2016-11-22 NOTE — Telephone Encounter (Signed)
-----   Message from Truitt Merle, MD sent at 11/22/2016  8:11 AM EST ----- Please call pt and let her know the lab result. Her iron level is fine this time, no need iv feraheme for now.   Thanks  Truitt Merle  11/22/2016

## 2016-11-22 NOTE — Telephone Encounter (Signed)
Spoke with pt and informed pt that iron level is fine this time, no need for IV Feraheme for now as per Dr. Burr Medico.  Pt voiced understanding.  Confirmed appts for Feb as scheduled.

## 2016-11-23 ENCOUNTER — Ambulatory Visit: Payer: Medicare Other | Admitting: Gynecologic Oncology

## 2016-12-16 ENCOUNTER — Telehealth: Payer: Self-pay | Admitting: Hematology

## 2016-12-16 ENCOUNTER — Other Ambulatory Visit (HOSPITAL_BASED_OUTPATIENT_CLINIC_OR_DEPARTMENT_OTHER): Payer: Medicare Other

## 2016-12-16 DIAGNOSIS — D500 Iron deficiency anemia secondary to blood loss (chronic): Secondary | ICD-10-CM | POA: Diagnosis not present

## 2016-12-16 DIAGNOSIS — N920 Excessive and frequent menstruation with regular cycle: Secondary | ICD-10-CM | POA: Diagnosis present

## 2016-12-16 LAB — CBC & DIFF AND RETIC
BASO%: 0.1 % (ref 0.0–2.0)
Basophils Absolute: 0 10*3/uL (ref 0.0–0.1)
EOS%: 0.2 % (ref 0.0–7.0)
Eosinophils Absolute: 0 10*3/uL (ref 0.0–0.5)
HCT: 37.3 % (ref 34.8–46.6)
HEMOGLOBIN: 11.5 g/dL — AB (ref 11.6–15.9)
IMMATURE RETIC FRACT: 7.6 % (ref 1.60–10.00)
LYMPH#: 0.8 10*3/uL — AB (ref 0.9–3.3)
LYMPH%: 9.3 % — ABNORMAL LOW (ref 14.0–49.7)
MCH: 26.5 pg (ref 25.1–34.0)
MCHC: 30.8 g/dL — ABNORMAL LOW (ref 31.5–36.0)
MCV: 85.9 fL (ref 79.5–101.0)
MONO#: 0.6 10*3/uL (ref 0.1–0.9)
MONO%: 7.7 % (ref 0.0–14.0)
NEUT#: 6.7 10*3/uL — ABNORMAL HIGH (ref 1.5–6.5)
NEUT%: 82.7 % — AB (ref 38.4–76.8)
PLATELETS: 225 10*3/uL (ref 145–400)
RBC: 4.34 10*6/uL (ref 3.70–5.45)
RDW: 17.2 % — ABNORMAL HIGH (ref 11.2–14.5)
RETIC CT ABS: 81.59 10*3/uL (ref 33.70–90.70)
Retic %: 1.88 % (ref 0.70–2.10)
WBC: 8.2 10*3/uL (ref 3.9–10.3)

## 2016-12-16 LAB — FERRITIN: FERRITIN: 31 ng/mL (ref 9–269)

## 2016-12-16 LAB — TECHNOLOGIST REVIEW

## 2016-12-16 NOTE — Telephone Encounter (Signed)
Patient called to have follow up appointment rescheduled.

## 2016-12-20 ENCOUNTER — Telehealth: Payer: Self-pay | Admitting: *Deleted

## 2016-12-20 NOTE — Telephone Encounter (Signed)
-----   Message from Truitt Merle, MD sent at 12/19/2016  1:24 PM EST ----- Please let pt know her lab result (mild anemia, ferritin below goal of 50) and schedule her for feraheme after her OV on 2/26. Thanks  Truitt Merle  12/19/2016

## 2016-12-20 NOTE — Telephone Encounter (Signed)
Telephone call to patient- discussed lab results and confirmed appointment on 2/26 for both OV and infusion.

## 2016-12-23 ENCOUNTER — Ambulatory Visit: Payer: Medicare Other | Admitting: Hematology

## 2016-12-23 NOTE — Progress Notes (Signed)
[facility]  Telephone:([phone] Fax:([phone]) [phone]  Clinic Follow Up Note   Patient Care Team: [name], NP as PCP - General (Family Medicine) Wonda Horner, MD (Gastroenterology) Rigoberto Noel, MD as Consulting Physician (Pulmonary Disease) 12/26/2016  CHIEF COMPLAINTS:  Follow up anemia   HISTORY OF PRESENTING ILLNESS (06/23/2016):  [name] 51 y.o. female with past medical history of sarcoidosis, on chronic continuous oxygen 2 L/min, morbid obesity, congestive heart failure, here because of her anemia. She was referred by her PCP.   She has had anemia since she was in her 15's. She has very heavy menstrual period, the last about 7 days, she has to change a pad 5-6 times a day, and she contributes her anemia to her heavy period. She underwent endometrial ablation once in the past, which did not work. She would like to have a hysterectomy, which has not been offered by her gynecologist. She has been taking iron pill one or twice a day, but not very compliant. Her anemia has been getting worse lately, she was admitted to hospital twice for severe anemia in the past year, and received blood transfusion and IV feraheme in 12/2014 and 06/17/2016. She has been following with her PCP for her anemia.   She denies recent chest pain on exertion, shortness of breath on minimal exertion, pre-syncopal episodes, or palpitations. She had not noticed any other recent bleeding such as epistaxis, hematuria or hematochezia The patient denies over the counter NSAID ingestion. She is not on antiplatelets agents. She had no prior history or diagnosis of cancer. Her age appropriate screening programs are up-to-date. She denies any pica and eats a variety of diet. She never donated blood or received blood transfusion  CURRENT THERAPY: iv Feraheme 510mg  as needed, received on 01/16/2015, 06/17/2016, 07/01/2016  INTERIM HISTORY: She returns for follow up. She has been doing  well. Her iron has been low lately. She says it is because she had an IUD put in place, but it came out after a couple of months. Now she is having very heavy periods again. Her mother didn't go through menopause until she was 107. Both of her sisters didn't go through menopause until they were 39. Denies any other concerns.   MEDICAL HISTORY:  Past Medical History:  Diagnosis Date  . Anemia   . Arthritis    hands, shoulders, no meds  . CHF (congestive heart failure) (H[facility])    EF60-65%  . Chronic hyperventilation syndrome    w/ obesity tx with albuterol inhaler and oxygen 2L  . COPD (chronic obstructive pulmonary disease) ([place])    uses oxygen 2 L  . Gallstones   . GERD (gastroesophageal reflux disease)    diet controlled - no meds  . H/O hiatal hernia   . Hypertension   . Hypothyroidism   . Kidney stones   . Morbid obesity ([place])   . Pneumonia    hoispitalized in 08/2011  . Sarcoidosis ([place])   . Seasonal allergies   . Shortness of breath    with exertion   . Sleep apnea    uses CPAP machine     SURGICAL HISTORY: Past Surgical History:  Procedure Laterality Date  . [place]   x 1  . CHOLECYSTECTOMY  2000  . DILATION AND CURETTAGE OF UTERUS N/A 08/09/2016   Procedure: DILATATION AND CURETTAGE;  Surgeon: Everitt Amber, MD;  Location: [facility];  Service: Gynecology;  Laterality: N/A;  . HYSTEROSCOPY W/D&C  12/27/2011   Procedure: DILATATION AND CURETTAGE /HYSTEROSCOPY;  Surgeon: Maeola Sarah. Landry Mellow, MD;  Location: [facility];  Service: Gynecology;;  . I and D of abcess  05/2011  . INTRAUTERINE DEVICE (IUD) INSERTION N/A 08/09/2016   Procedure: INTRAUTERINE DEVICE (IUD) INSERTION;  Surgeon: Everitt Amber, MD;  Location: [facility];  Service: Gynecology;  Laterality: N/A;  . LUNG BIOPSY    . uterine abletion      SOCIAL HISTORY: Social History   Social History  . Marital status: Single    Spouse name: N/A  . Number of children: N/A  . Years of education: N/A   Occupational  History  . unemployeed    Social History Main Topics  . Smoking status: Never Smoker  . Smokeless tobacco: Never Used  . Alcohol use Yes     Comment: occasionally  . Drug use: No  . Sexual activity: No   Other Topics Concern  . Not on file   Social History Narrative   Is not currently working.    Divorced for eight or nine years   Has one son who lives around here.        FAMILY HISTORY: Family History  Problem Relation Age of Onset  . Diabetes Father   . Cancer Father 101    colon cancer   . Diabetes Brother   . Deep vein thrombosis Mother   . Aneurysm Sister     d/o brain aneurysm    ALLERGIES:  has No Known Allergies.  MEDICATIONS:  Current Outpatient Prescriptions  Medication Sig Dispense Refill  . albuterol (PROVENTIL HFA;VENTOLIN HFA) 108 (90 Base) MCG/ACT inhaler Inhale 1-2 puffs into the lungs every 6 (six) hours as needed for wheezing. 1 Inhaler 0  . cetirizine (ZYRTEC) 10 MG tablet Take 10 mg by mouth at bedtime as needed for allergies.    . cholecalciferol (VITAMIN D) 400 UNITS TABS tablet Take 400 Units by mouth daily.    Marland Kitchen Coral Calcium 1000 (390 CA) MG TABS Take 1,000 mg by mouth daily.     . ferrous sulfate 325 (65 FE) MG tablet Take 1 tablet (325 mg total) by mouth 3 (three) times daily with meals. [id number] tablet 11  . folic acid (FOLVITE) 1 MG tablet Take 1 mg by mouth daily.  3  . furosemide (LASIX) 20 MG tablet Take 3 tablets (60 mg total) by mouth 2 (two) times daily. (Patient taking differently: Take 60 mg by mouth daily. ) 60 tablet 0  . HYDROcodone-acetaminophen (NORCO) 10-325 MG tablet Take 1 tablet by mouth every 6 (six) hours as needed (pain). 30 tablet 0  . ibuprofen (ADVIL,MOTRIN) 800 MG tablet Take 1 tablet (800 mg total) by mouth every 8 (eight) hours as needed. 30 tablet 0  . levothyroxine (SYNTHROID, LEVOTHROID) 100 MCG tablet Take 1 tablet (100 mcg total) by mouth daily at 6 (six) AM. 30 tablet 4  . losartan (COZAAR) 50 MG tablet TAKE 1  TABLET (50 MG TOTAL) BY MOUTH DAILY. 30 tablet 5  . methotrexate 50 MG/2ML injection Inject 15 mg into the skin every Friday. Injects 0.61ml.  3  . metoprolol succinate (TOPROL-XL) 25 MG 24 hr tablet TAKE 1 TABLET (25 MG TOTAL) BY MOUTH DAILY. 30 tablet 5  . NON FORMULARY 2 liter of oxygen    . predniSONE (DELTASONE) 10 MG tablet Take 10 mg by mouth daily.  3  . triamcinolone acetonide (KENALOG) 40 MG/ML injection Inject 40 mg into the muscle every 3 (three) months. For  knee pain    . triamcinolone ointment (KENALOG) 0.1 % Apply 1 application topically See admin instructions. Applies twice a day as needed for break out of Sarcoidosis    . vitamin B-12 1000 MCG tablet Take 1 tablet (1,000 mcg total) by mouth daily. 30 tablet 0  . levonorgestrel (MIRENA) 20 MCG/24HR IUD 1 Intra Uterine Device (1 each total) by Intrauterine route once. 1 each 0   No current facility-administered medications for this visit.    Facility-Administered Medications Ordered in Other Visits  Medication Dose Route Frequency Provider Last Rate Last Dose  . ferumoxytol (FERAHEME) 510 mg in sodium chloride 0.9 % 100 mL IVPB  510 mg Intravenous Once Truitt Merle, MD   510 mg at 12/26/16 1524    REVIEW OF SYSTEMS:   Constitutional: Denies fevers, chills or abnormal night sweats Eyes: Denies blurriness of vision, double vision or watery eyes Ears, nose, mouth, throat, and face: Denies mucositis or sore throat Respiratory: Denies cough, dyspnea or wheezes Cardiovascular: Denies palpitation, chest discomfort or lower extremity swelling Gastrointestinal:  Denies nausea, heartburn or change in bowel habits Skin: Denies abnormal skin rashes Lymphatics: Denies new lymphadenopathy or easy bruising Neurological:Denies numbness, tingling or new weaknesses Behavioral/Psych: Mood is stable, no new changes  All other systems were reviewed with the patient and are negative.  PHYSICAL EXAMINATION: ECOG PERFORMANCE STATUS: 1 - Symptomatic  but completely ambulatory  Vitals:   12/26/16 1426  BP: (!) 147/88  Pulse: 81  Resp: 19  Temp: 98.3 F (36.8 C)   Filed Weights   12/26/16 1429  Weight: (!) 489 lb (221.8 kg)   GENERAL:alert, no distress and comfortable, Morbidly obese female, on nasal cannula oxygen SKIN: skin color, texture, turgor are normal, no rashes or significant lesions EYES: normal, conjunctiva are pink and non-injected, sclera clear OROPHARYNX:no exudate, no erythema and lips, buccal mucosa, and tongue normal  NECK: supple, thyroid normal size, non-tender, without nodularity LYMPH:  no palpable lymphadenopathy in the cervical, axillary or inguinal LUNGS: clear to auscultation and percussion with normal breathing effort HEART: regular rate & rhythm and no murmurs and no lower extremity edema ABDOMEN:abdomen soft, non-tender and normal bowel sounds Musculoskeletal:no cyanosis of digits and no clubbing  PSYCH: alert & oriented x 3 with fluent speech NEURO: no focal motor/sensory deficits  LABORATORY DATA:  I have reviewed the data as listed CBC Latest Ref Rng & Units 12/16/2016 11/21/2016 09/14/2016  WBC 3.9 - 10.3 10e3/uL 8.2 7.0 6.3  Hemoglobin 11.6 - 15.9 g/dL 11.5(L) 12.3 12.6  Hematocrit 34.8 - 46.6 % 37.3 39.3 41.3  Platelets 145 - 400 10e3/uL 225 204 236    CMP Latest Ref Rng & Units 07/28/2016 06/21/2016 06/19/2016  Glucose 65 - 99 mg/dL 126(H) 108(H) 122(H)  BUN 6 - 20 mg/dL 15 12 13   Creatinine 0.44 - 1.00 mg/dL 0.72 0.69 0.59  Sodium 135 - 145 mmol/L 138 141 140  Potassium 3.5 - 5.1 mmol/L 4.2 4.1 4.3  Chloride 101 - 111 mmol/L 98(L) 98 100(L)  CO2 22 - 32 mmol/L 32 35(H) 32  Calcium 8.9 - 10.3 mg/dL 9.2 8.9 8.4(L)  Total Protein 6.5 - 8.1 g/dL - - -  Total Bilirubin 0.3 - 1.2 mg/dL - - -  Alkaline Phos 38 - 126 U/L - - -  AST 15 - 41 U/L - - -  ALT 14 - 54 U/L - - -    RADIOGRAPHIC STUDIES: I have personally reviewed the radiological images as listed and agreed with  the findings in  the report. No results found.  ASSESSMENT & PLAN:  51 y.o. premenopause female, presented with chronic anemia, heavy menstrual periods, and worsening anemia lately, and required hospitalization and blood transfusions.  1. Iron deficient anemia from Menorrhagia -Her multiple iron study has showed very low level of ferritin, serum iron level and transferrin saturation, her MCV is low, consistent with iron deficient anemia. -Her iron deficiency is likely from her menorrhagia -She is not very compliant with oral iron supplement, responded well to IV iron -Her anemia resolved after IV iron, but her hemoglobin dropped again, along with a dropping of ferritin to 30 last week -I recommend her to have IV iron today and next week -She is status post I&D and IUD placement, which really slowed down her period. However, her IUD came out after 2 months, so she has started having heavy periods again. She is going to follow up with her gynecologist to see what her next step would be. Dr. Denman George previously put in the IUD.  -Will give IV Feraheme today and next week -We'll follow up with her CBC and iron study closely, every 4 weeks -Labs today pending  2. Hypertension, pulmonary sarcomatosis, congestive heart failure morbid obesity -She'll continue follow-up with her primary care physician and other doctors   Plan  -IV Feraheme today and next week  -Labs today pending. -Repeat CBC and ferritin in 1 month due to her menorrhagia. She will call us the day after her lab work, to see if she needs IV Feraheme (if ferritin<50) or blood (if Hb<8.0)  -I'll see her back in 3 months with lab   All questions were answered. The patient knows to call the clinic with any problems, questions or concerns.  I spent 15 minutes counseling the patient face to face. The total time spent in the appointment was 20 minutes and more than 50% was on counseling.  This document serves as a record of services personally performed by  Truitt Merle, MD. It was created on her behalf by Martinique Casey, a trained medical scribe. The creation of this record is based on the scribe's personal observations and the provider's statements to them. This document has been checked and approved by the attending provider.  I have reviewed the above documentation for accuracy and completeness and I agree with the above.   Truitt Merle, MD 12/26/16

## 2016-12-26 ENCOUNTER — Ambulatory Visit (HOSPITAL_BASED_OUTPATIENT_CLINIC_OR_DEPARTMENT_OTHER): Payer: Medicare Other

## 2016-12-26 ENCOUNTER — Ambulatory Visit (HOSPITAL_BASED_OUTPATIENT_CLINIC_OR_DEPARTMENT_OTHER): Payer: Medicare Other | Admitting: Hematology

## 2016-12-26 ENCOUNTER — Encounter: Payer: Self-pay | Admitting: Hematology

## 2016-12-26 VITALS — BP 147/88 | HR 81 | Temp 98.3°F | Resp 19 | Ht 66.0 in | Wt >= 6400 oz

## 2016-12-26 VITALS — BP 173/89 | HR 74 | Temp 98.2°F | Resp 20

## 2016-12-26 DIAGNOSIS — N920 Excessive and frequent menstruation with regular cycle: Secondary | ICD-10-CM

## 2016-12-26 DIAGNOSIS — D500 Iron deficiency anemia secondary to blood loss (chronic): Secondary | ICD-10-CM

## 2016-12-26 DIAGNOSIS — I10 Essential (primary) hypertension: Secondary | ICD-10-CM

## 2016-12-26 DIAGNOSIS — D869 Sarcoidosis, unspecified: Secondary | ICD-10-CM

## 2016-12-26 MED ORDER — FERUMOXYTOL INJECTION 510 MG/17 ML
510.0000 mg | Freq: Once | INTRAVENOUS | Status: AC
  Administered 2016-12-26: 510 mg via INTRAVENOUS
  Filled 2016-12-26: qty 17

## 2016-12-26 MED ORDER — SODIUM CHLORIDE 0.9 % IV SOLN
Freq: Once | INTRAVENOUS | Status: AC
  Administered 2016-12-26: 15:00:00 via INTRAVENOUS

## 2016-12-26 NOTE — Patient Instructions (Signed)
Ferumoxytol injection What is this medicine? FERUMOXYTOL is an iron complex. Iron is used to make healthy red blood cells, which carry oxygen and nutrients throughout the body. This medicine is used to treat iron deficiency anemia in people with chronic kidney disease. COMMON BRAND NAME(S): Feraheme What should I tell my health care provider before I take this medicine? They need to know if you have any of these conditions: -anemia not caused by low iron levels -high levels of iron in the blood -magnetic resonance imaging (MRI) test scheduled -an unusual or allergic reaction to iron, other medicines, foods, dyes, or preservatives -pregnant or trying to get pregnant -breast-feeding How should I use this medicine? This medicine is for injection into a vein. It is given by a health care professional in a hospital or clinic setting. Talk to your pediatrician regarding the use of this medicine in children. Special care may be needed. What if I miss a dose? It is important not to miss your dose. Call your doctor or health care professional if you are unable to keep an appointment. What may interact with this medicine? This medicine may interact with the following medications: -other iron products What should I watch for while using this medicine? Visit your doctor or healthcare professional regularly. Tell your doctor or healthcare professional if your symptoms do not start to get better or if they get worse. You may need blood work done while you are taking this medicine. You may need to follow a special diet. Talk to your doctor. Foods that contain iron include: whole grains/cereals, dried fruits, beans, or peas, leafy green vegetables, and organ meats (liver, kidney). What side effects may I notice from receiving this medicine? Side effects that you should report to your doctor or health care professional as soon as possible: -allergic reactions like skin rash, itching or hives, swelling of the  face, lips, or tongue -breathing problems -changes in blood pressure -feeling faint or lightheaded, falls -fever or chills -flushing, sweating, or hot feelings -swelling of the ankles or feet Side effects that usually do not require medical attention (report to your doctor or health care professional if they continue or are bothersome): -diarrhea -headache -nausea, vomiting -stomach pain Where should I keep my medicine? This drug is given in a hospital or clinic and will not be stored at home.  2017 Elsevier/Gold Standard (2015-11-19 12:41:49)  

## 2016-12-29 DIAGNOSIS — J189 Pneumonia, unspecified organism: Secondary | ICD-10-CM | POA: Diagnosis not present

## 2016-12-30 DIAGNOSIS — J189 Pneumonia, unspecified organism: Secondary | ICD-10-CM | POA: Diagnosis not present

## 2016-12-31 DIAGNOSIS — J189 Pneumonia, unspecified organism: Secondary | ICD-10-CM | POA: Diagnosis not present

## 2017-01-01 DIAGNOSIS — J189 Pneumonia, unspecified organism: Secondary | ICD-10-CM | POA: Diagnosis not present

## 2017-01-02 ENCOUNTER — Ambulatory Visit (HOSPITAL_BASED_OUTPATIENT_CLINIC_OR_DEPARTMENT_OTHER): Payer: Medicare HMO

## 2017-01-02 ENCOUNTER — Ambulatory Visit (INDEPENDENT_AMBULATORY_CARE_PROVIDER_SITE_OTHER): Payer: Medicare HMO | Admitting: Pulmonary Disease

## 2017-01-02 ENCOUNTER — Encounter: Payer: Self-pay | Admitting: Pulmonary Disease

## 2017-01-02 VITALS — BP 141/59 | HR 86 | Temp 98.2°F | Resp 18

## 2017-01-02 DIAGNOSIS — N920 Excessive and frequent menstruation with regular cycle: Secondary | ICD-10-CM

## 2017-01-02 DIAGNOSIS — J9611 Chronic respiratory failure with hypoxia: Secondary | ICD-10-CM

## 2017-01-02 DIAGNOSIS — D500 Iron deficiency anemia secondary to blood loss (chronic): Secondary | ICD-10-CM

## 2017-01-02 DIAGNOSIS — J189 Pneumonia, unspecified organism: Secondary | ICD-10-CM | POA: Diagnosis not present

## 2017-01-02 DIAGNOSIS — G4733 Obstructive sleep apnea (adult) (pediatric): Secondary | ICD-10-CM

## 2017-01-02 DIAGNOSIS — D869 Sarcoidosis, unspecified: Secondary | ICD-10-CM

## 2017-01-02 MED ORDER — SODIUM CHLORIDE 0.9 % IV SOLN
INTRAVENOUS | Status: DC
  Administered 2017-01-02: 15:00:00 via INTRAVENOUS

## 2017-01-02 MED ORDER — SODIUM CHLORIDE 0.9 % IV SOLN
510.0000 mg | Freq: Once | INTRAVENOUS | Status: AC
  Administered 2017-01-02: 510 mg via INTRAVENOUS
  Filled 2017-01-02: qty 17

## 2017-01-02 NOTE — Assessment & Plan Note (Signed)
Continue on 2 L of oxygen during exertion. Okay to stay off oxygen at rest. Her dyspnea is multifactorial and related to morbid obesity and some degree of edema-difficult to delineate the contribution of sarcoidosis to this

## 2017-01-02 NOTE — Progress Notes (Signed)
Subjective:    Patient ID: [name], female    DOB: August 28, 1966, 51 y.o.   MRN: [id number]  HPI  [name]   51 yo morbidly obese woman with OSA & BL infiltrates & hilar and mediastinal lymphadenopathy , presumed sarcoid (although TBBX neg ) with skin & joint involvement She also has inflammatory arthritis with positive ANA and follows with rheumatology [name])  01/02/2017  Chief Complaint  Patient presents with  . Follow-up    Last seen in 2017. Breathing has been about the same since last visit.    80mFU She is maintained on 20 mg of injectable methotrexate by Dr. HTrudie ReedUnfortunately has not been able to decrease prednisone and remains on 10 mg for the last 2 years, whenever she tries to taper she complains of joint pains in skin rash and increased dyspnea. Skin lesions have been biopsied in the past and shown to be exposure keratitis rather than sarcoidosis.  She also has persistent menorrhagia and anemia, last hemoglobin was 11.5 in February 2018. She is now on IV iron injections. She is unable to find a GYN would would perform a hysterectomy. She had an IUD placed which fell out    Significant tests/ events:  First seen in hospital 12/2010  for acute dyspnea and hypoxia found to have OHS and OSA with recs for continuous O2 ( 2 L/m at rest & 4L/m on walking)and Nocturnal CPAP  Echo- mild LVH , EF 55-60% . VQ scan intermed prob , doppler neg. CT chest  neg for PE.  CPAP titration 03/2011 (wt 479 lbs) -CPAP 7 cm to stop snoring, No desaturation on O2 2 L/min   09/2011 - Hosp adm for hemoptysis & BL infiltrates on CT,p-anca pos 1.80, gbm neg, ANA neg, ESR 42  Labs c/w fe def anemia, UA pos blood (was having periods) >> rpt UA no RBCs  Underwent endometrial ablation for menorrhagia -unsuccessful  08/2012  C-ANCA 1:40, ANA 1:320 , homogenous  ACE level 21   10/02/2012 TBBX >> no granulomas, BAL neg afb, fungal  12/2012 -[facility] repeat labs>> neg pANCA and cANCA ,ESR 48 and neg ANA , low ACE level    CT chest 12/2013 >decreased hilar /mediastinal adenopathy-(done 1 week after steroids started ) >>>Pred decreased to 538mdaily    06/12/14  Patient reports previous biopsy of, arm, rash, by dermatology was consistent with sun exposure dermatitis.   10/2014 PFTs-FVC 55%, DLCO 44%  Adm 12/2014 for acute resp failure secondary to CHF and PNA superimposed upon underlying OSA/OHS, sarcoidosis, and anemia -required 2 units PRBC for hemoglobin 6.7 CT angiogram chest negative for pulmonary embolus-Revealed scattered  opacities bilateral   Review of Systems neg for any significant sore throat, dysphagia, itching, sneezing, nasal congestion or excess/ purulent secretions, fever, chills, sweats, unintended wt loss, pleuritic or exertional cp, hempoptysis, orthopnea pnd or change in chronic leg swelling. Also denies presyncope, palpitations, heartburn, abdominal pain, nausea, vomiting, diarrhea or change in bowel or urinary habits, dysuria,hematuria, rash, arthralgias, visual complaints, headache, numbness weakness or ataxia.     Objective:   Physical Exam Gen. Pleasant, morbid obese, in no distress, normal affect ENT - no lesions, no post nasal drip, class 2-3 airway Neck: No JVD, no thyromegaly, no carotid bruits Lungs: no use of accessory muscles, no dullness to percussion, decreased without rales or rhonchi  Cardiovascular: Rhythm regular, heart sounds  normal, no murmurs or gallops, no  peripheral edema Abdomen: soft and non-tender, no hepatosplenomegaly, BS normal. Musculoskeletal: No deformities, no cyanosis or clubbing Neuro:  alert, non focal, no tremors        Assessment & Plan:

## 2017-01-02 NOTE — Patient Instructions (Signed)
CT chest without contrast Schedule spirometry with DLCO testing prior to next visit  Drop prednisone to 5 mg on Monday/Wednesday/Friday Take 10 mg all other days  Stay on methotrexate-and discussed with Dr. [name] about increasing dose

## 2017-01-02 NOTE — Assessment & Plan Note (Signed)
CT chest without contrast Schedule spirometry with DLCO testing prior to next visit  Drop prednisone to 5 mg on Monday/Wednesday/Friday Take 10 mg all other days  Stay on methotrexate-and discussed with Dr. [name] about increasing dose  Unfortunately we have not been able to decreased dose of prednisone in spite of her going on methotrexate

## 2017-01-02 NOTE — Patient Instructions (Signed)
Ferumoxytol injection What is this medicine? FERUMOXYTOL is an iron complex. Iron is used to make healthy red blood cells, which carry oxygen and nutrients throughout the body. This medicine is used to treat iron deficiency anemia in people with chronic kidney disease. COMMON BRAND NAME(S): Feraheme What should I tell my health care provider before I take this medicine? They need to know if you have any of these conditions: -anemia not caused by low iron levels -high levels of iron in the blood -magnetic resonance imaging (MRI) test scheduled -an unusual or allergic reaction to iron, other medicines, foods, dyes, or preservatives -pregnant or trying to get pregnant -breast-feeding How should I use this medicine? This medicine is for injection into a vein. It is given by a health care professional in a hospital or clinic setting. Talk to your pediatrician regarding the use of this medicine in children. Special care may be needed. What if I miss a dose? It is important not to miss your dose. Call your doctor or health care professional if you are unable to keep an appointment. What may interact with this medicine? This medicine may interact with the following medications: -other iron products What should I watch for while using this medicine? Visit your doctor or healthcare professional regularly. Tell your doctor or healthcare professional if your symptoms do not start to get better or if they get worse. You may need blood work done while you are taking this medicine. You may need to follow a special diet. Talk to your doctor. Foods that contain iron include: whole grains/cereals, dried fruits, beans, or peas, leafy green vegetables, and organ meats (liver, kidney). What side effects may I notice from receiving this medicine? Side effects that you should report to your doctor or health care professional as soon as possible: -allergic reactions like skin rash, itching or hives, swelling of the  face, lips, or tongue -breathing problems -changes in blood pressure -feeling faint or lightheaded, falls -fever or chills -flushing, sweating, or hot feelings -swelling of the ankles or feet Side effects that usually do not require medical attention (report to your doctor or health care professional if they continue or are bothersome): -diarrhea -headache -nausea, vomiting -stomach pain Where should I keep my medicine? This drug is given in a hospital or clinic and will not be stored at home.  2017 Elsevier/Gold Standard (2015-11-19 12:41:49)  

## 2017-01-02 NOTE — Assessment & Plan Note (Signed)
Continue CPAP for 2 L of oxygen blended in  Weight loss encouraged, compliance with goal of at least 4-6 hrs every night is the expectation. Advised against medications with sedative side effects Cautioned against driving when sleepy - understanding that sleepiness will vary on a day to day basis

## 2017-01-03 DIAGNOSIS — J189 Pneumonia, unspecified organism: Secondary | ICD-10-CM | POA: Diagnosis not present

## 2017-01-04 DIAGNOSIS — J189 Pneumonia, unspecified organism: Secondary | ICD-10-CM | POA: Diagnosis not present

## 2017-01-05 DIAGNOSIS — J189 Pneumonia, unspecified organism: Secondary | ICD-10-CM | POA: Diagnosis not present

## 2017-01-06 DIAGNOSIS — J189 Pneumonia, unspecified organism: Secondary | ICD-10-CM | POA: Diagnosis not present

## 2017-01-07 DIAGNOSIS — J189 Pneumonia, unspecified organism: Secondary | ICD-10-CM | POA: Diagnosis not present

## 2017-01-08 DIAGNOSIS — J189 Pneumonia, unspecified organism: Secondary | ICD-10-CM | POA: Diagnosis not present

## 2017-01-09 DIAGNOSIS — J189 Pneumonia, unspecified organism: Secondary | ICD-10-CM | POA: Diagnosis not present

## 2017-01-10 DIAGNOSIS — J189 Pneumonia, unspecified organism: Secondary | ICD-10-CM | POA: Diagnosis not present

## 2017-01-11 DIAGNOSIS — J189 Pneumonia, unspecified organism: Secondary | ICD-10-CM | POA: Diagnosis not present

## 2017-01-12 ENCOUNTER — Telehealth: Payer: Self-pay | Admitting: Hematology

## 2017-01-12 DIAGNOSIS — J189 Pneumonia, unspecified organism: Secondary | ICD-10-CM | POA: Diagnosis not present

## 2017-01-12 NOTE — Telephone Encounter (Signed)
Spoke with patient and confirmed all appointment

## 2017-01-13 DIAGNOSIS — J189 Pneumonia, unspecified organism: Secondary | ICD-10-CM | POA: Diagnosis not present

## 2017-01-14 DIAGNOSIS — J189 Pneumonia, unspecified organism: Secondary | ICD-10-CM | POA: Diagnosis not present

## 2017-01-15 DIAGNOSIS — J189 Pneumonia, unspecified organism: Secondary | ICD-10-CM | POA: Diagnosis not present

## 2017-01-16 DIAGNOSIS — J189 Pneumonia, unspecified organism: Secondary | ICD-10-CM | POA: Diagnosis not present

## 2017-01-17 DIAGNOSIS — J189 Pneumonia, unspecified organism: Secondary | ICD-10-CM | POA: Diagnosis not present

## 2017-01-18 DIAGNOSIS — J189 Pneumonia, unspecified organism: Secondary | ICD-10-CM | POA: Diagnosis not present

## 2017-01-19 DIAGNOSIS — J189 Pneumonia, unspecified organism: Secondary | ICD-10-CM | POA: Diagnosis not present

## 2017-01-20 DIAGNOSIS — J189 Pneumonia, unspecified organism: Secondary | ICD-10-CM | POA: Diagnosis not present

## 2017-01-21 DIAGNOSIS — J189 Pneumonia, unspecified organism: Secondary | ICD-10-CM | POA: Diagnosis not present

## 2017-01-22 DIAGNOSIS — J189 Pneumonia, unspecified organism: Secondary | ICD-10-CM | POA: Diagnosis not present

## 2017-01-23 ENCOUNTER — Other Ambulatory Visit: Payer: Medicare Other

## 2017-01-23 ENCOUNTER — Other Ambulatory Visit: Payer: Self-pay | Admitting: Adult Health

## 2017-01-23 DIAGNOSIS — J189 Pneumonia, unspecified organism: Secondary | ICD-10-CM | POA: Diagnosis not present

## 2017-01-24 DIAGNOSIS — J189 Pneumonia, unspecified organism: Secondary | ICD-10-CM | POA: Diagnosis not present

## 2017-01-24 NOTE — Telephone Encounter (Signed)
Ok to refill for 6 months 

## 2017-01-25 ENCOUNTER — Other Ambulatory Visit (HOSPITAL_BASED_OUTPATIENT_CLINIC_OR_DEPARTMENT_OTHER): Payer: Medicare HMO

## 2017-01-25 DIAGNOSIS — N920 Excessive and frequent menstruation with regular cycle: Secondary | ICD-10-CM

## 2017-01-25 DIAGNOSIS — D500 Iron deficiency anemia secondary to blood loss (chronic): Secondary | ICD-10-CM

## 2017-01-25 DIAGNOSIS — J189 Pneumonia, unspecified organism: Secondary | ICD-10-CM | POA: Diagnosis not present

## 2017-01-25 LAB — CBC & DIFF AND RETIC
BASO%: 0 % (ref 0.0–2.0)
Basophils Absolute: 0 10*3/uL (ref 0.0–0.1)
EOS%: 0.2 % (ref 0.0–7.0)
Eosinophils Absolute: 0 10*3/uL (ref 0.0–0.5)
HEMATOCRIT: 40.8 % (ref 34.8–46.6)
HGB: 12.8 g/dL (ref 11.6–15.9)
Immature Retic Fract: 6.6 % (ref 1.60–10.00)
LYMPH%: 8.7 % — AB (ref 14.0–49.7)
MCH: 27.1 pg (ref 25.1–34.0)
MCHC: 31.4 g/dL — AB (ref 31.5–36.0)
MCV: 86.3 fL (ref 79.5–101.0)
MONO#: 0.5 10*3/uL (ref 0.1–0.9)
MONO%: 8.2 % (ref 0.0–14.0)
NEUT%: 82.9 % — AB (ref 38.4–76.8)
NEUTROS ABS: 4.7 10*3/uL (ref 1.5–6.5)
PLATELETS: 179 10*3/uL (ref 145–400)
RBC: 4.73 10*6/uL (ref 3.70–5.45)
RDW: 19.1 % — ABNORMAL HIGH (ref 11.2–14.5)
RETIC CT ABS: 69.53 10*3/uL (ref 33.70–90.70)
Retic %: 1.47 % (ref 0.70–2.10)
WBC: 5.7 10*3/uL (ref 3.9–10.3)
lymph#: 0.5 10*3/uL — ABNORMAL LOW (ref 0.9–3.3)
nRBC: 0 % (ref 0–0)

## 2017-01-25 LAB — FERRITIN: Ferritin: 106 ng/ml (ref 9–269)

## 2017-01-26 DIAGNOSIS — J189 Pneumonia, unspecified organism: Secondary | ICD-10-CM | POA: Diagnosis not present

## 2017-01-27 DIAGNOSIS — J189 Pneumonia, unspecified organism: Secondary | ICD-10-CM | POA: Diagnosis not present

## 2017-01-28 DIAGNOSIS — J189 Pneumonia, unspecified organism: Secondary | ICD-10-CM | POA: Diagnosis not present

## 2017-01-29 DIAGNOSIS — J189 Pneumonia, unspecified organism: Secondary | ICD-10-CM | POA: Diagnosis not present

## 2017-01-30 DIAGNOSIS — J189 Pneumonia, unspecified organism: Secondary | ICD-10-CM | POA: Diagnosis not present

## 2017-01-31 DIAGNOSIS — J189 Pneumonia, unspecified organism: Secondary | ICD-10-CM | POA: Diagnosis not present

## 2017-02-01 DIAGNOSIS — J189 Pneumonia, unspecified organism: Secondary | ICD-10-CM | POA: Diagnosis not present

## 2017-02-02 DIAGNOSIS — J189 Pneumonia, unspecified organism: Secondary | ICD-10-CM | POA: Diagnosis not present

## 2017-02-03 DIAGNOSIS — J189 Pneumonia, unspecified organism: Secondary | ICD-10-CM | POA: Diagnosis not present

## 2017-02-04 DIAGNOSIS — J189 Pneumonia, unspecified organism: Secondary | ICD-10-CM | POA: Diagnosis not present

## 2017-02-05 DIAGNOSIS — J189 Pneumonia, unspecified organism: Secondary | ICD-10-CM | POA: Diagnosis not present

## 2017-02-06 DIAGNOSIS — J189 Pneumonia, unspecified organism: Secondary | ICD-10-CM | POA: Diagnosis not present

## 2017-02-07 DIAGNOSIS — J189 Pneumonia, unspecified organism: Secondary | ICD-10-CM | POA: Diagnosis not present

## 2017-02-08 DIAGNOSIS — J189 Pneumonia, unspecified organism: Secondary | ICD-10-CM | POA: Diagnosis not present

## 2017-02-08 DIAGNOSIS — G4733 Obstructive sleep apnea (adult) (pediatric): Secondary | ICD-10-CM | POA: Diagnosis not present

## 2017-02-08 DIAGNOSIS — E669 Obesity, unspecified: Secondary | ICD-10-CM | POA: Diagnosis not present

## 2017-02-09 DIAGNOSIS — J189 Pneumonia, unspecified organism: Secondary | ICD-10-CM | POA: Diagnosis not present

## 2017-02-10 DIAGNOSIS — J189 Pneumonia, unspecified organism: Secondary | ICD-10-CM | POA: Diagnosis not present

## 2017-02-11 ENCOUNTER — Inpatient Hospital Stay (HOSPITAL_COMMUNITY)
Admission: EM | Admit: 2017-02-11 | Discharge: 2017-02-16 | DRG: 190 | Disposition: A | Discharge status: Home Health | Payer: Medicare HMO | Attending: Internal Medicine | Admitting: Internal Medicine

## 2017-02-11 ENCOUNTER — Emergency Department (HOSPITAL_COMMUNITY): Payer: Medicare HMO

## 2017-02-11 ENCOUNTER — Encounter (HOSPITAL_COMMUNITY): Payer: Self-pay

## 2017-02-11 DIAGNOSIS — R739 Hyperglycemia, unspecified: Secondary | ICD-10-CM | POA: Diagnosis not present

## 2017-02-11 DIAGNOSIS — J9621 Acute and chronic respiratory failure with hypoxia: Secondary | ICD-10-CM | POA: Diagnosis not present

## 2017-02-11 DIAGNOSIS — Z975 Presence of (intrauterine) contraceptive device: Secondary | ICD-10-CM

## 2017-02-11 DIAGNOSIS — Z833 Family history of diabetes mellitus: Secondary | ICD-10-CM

## 2017-02-11 DIAGNOSIS — I10 Essential (primary) hypertension: Secondary | ICD-10-CM | POA: Diagnosis not present

## 2017-02-11 DIAGNOSIS — Z6841 Body Mass Index (BMI) 40.0 and over, adult: Secondary | ICD-10-CM

## 2017-02-11 DIAGNOSIS — E038 Other specified hypothyroidism: Secondary | ICD-10-CM | POA: Diagnosis not present

## 2017-02-11 DIAGNOSIS — I509 Heart failure, unspecified: Secondary | ICD-10-CM | POA: Diagnosis not present

## 2017-02-11 DIAGNOSIS — K219 Gastro-esophageal reflux disease without esophagitis: Secondary | ICD-10-CM | POA: Diagnosis present

## 2017-02-11 DIAGNOSIS — D869 Sarcoidosis, unspecified: Secondary | ICD-10-CM | POA: Diagnosis present

## 2017-02-11 DIAGNOSIS — K449 Diaphragmatic hernia without obstruction or gangrene: Secondary | ICD-10-CM | POA: Diagnosis present

## 2017-02-11 DIAGNOSIS — I5033 Acute on chronic diastolic (congestive) heart failure: Secondary | ICD-10-CM | POA: Diagnosis not present

## 2017-02-11 DIAGNOSIS — J441 Chronic obstructive pulmonary disease with (acute) exacerbation: Secondary | ICD-10-CM | POA: Diagnosis present

## 2017-02-11 DIAGNOSIS — Z9981 Dependence on supplemental oxygen: Secondary | ICD-10-CM

## 2017-02-11 DIAGNOSIS — Z7952 Long term (current) use of systemic steroids: Secondary | ICD-10-CM

## 2017-02-11 DIAGNOSIS — I517 Cardiomegaly: Secondary | ICD-10-CM | POA: Diagnosis not present

## 2017-02-11 DIAGNOSIS — K8020 Calculus of gallbladder without cholecystitis without obstruction: Secondary | ICD-10-CM | POA: Diagnosis present

## 2017-02-11 DIAGNOSIS — R0602 Shortness of breath: Secondary | ICD-10-CM | POA: Diagnosis not present

## 2017-02-11 DIAGNOSIS — Z713 Dietary counseling and surveillance: Secondary | ICD-10-CM

## 2017-02-11 DIAGNOSIS — Z87442 Personal history of urinary calculi: Secondary | ICD-10-CM

## 2017-02-11 DIAGNOSIS — R0902 Hypoxemia: Secondary | ICD-10-CM | POA: Diagnosis not present

## 2017-02-11 DIAGNOSIS — I272 Pulmonary hypertension, unspecified: Secondary | ICD-10-CM | POA: Diagnosis present

## 2017-02-11 DIAGNOSIS — J189 Pneumonia, unspecified organism: Secondary | ICD-10-CM | POA: Diagnosis not present

## 2017-02-11 DIAGNOSIS — I110 Hypertensive heart disease with heart failure: Secondary | ICD-10-CM | POA: Diagnosis present

## 2017-02-11 DIAGNOSIS — G4733 Obstructive sleep apnea (adult) (pediatric): Secondary | ICD-10-CM | POA: Diagnosis present

## 2017-02-11 DIAGNOSIS — Z8701 Personal history of pneumonia (recurrent): Secondary | ICD-10-CM

## 2017-02-11 DIAGNOSIS — J9811 Atelectasis: Secondary | ICD-10-CM | POA: Diagnosis not present

## 2017-02-11 DIAGNOSIS — E039 Hypothyroidism, unspecified: Secondary | ICD-10-CM | POA: Diagnosis present

## 2017-02-11 DIAGNOSIS — Z9049 Acquired absence of other specified parts of digestive tract: Secondary | ICD-10-CM

## 2017-02-11 DIAGNOSIS — Z79899 Other long term (current) drug therapy: Secondary | ICD-10-CM

## 2017-02-11 LAB — CBC WITH DIFFERENTIAL/PLATELET
Basophils Absolute: 0 10*3/uL (ref 0.0–0.1)
Basophils Relative: 0 %
Eosinophils Absolute: 0.2 10*3/uL (ref 0.0–0.7)
Eosinophils Relative: 2 %
HCT: 36.7 % (ref 36.0–46.0)
Hemoglobin: 11.4 g/dL — ABNORMAL LOW (ref 12.0–15.0)
Lymphocytes Relative: 9 %
Lymphs Abs: 0.5 10*3/uL — ABNORMAL LOW (ref 0.7–4.0)
MCH: 26.9 pg (ref 26.0–34.0)
MCHC: 31.1 g/dL (ref 30.0–36.0)
MCV: 86.6 fL (ref 78.0–100.0)
Monocytes Absolute: 0.3 10*3/uL (ref 0.1–1.0)
Monocytes Relative: 5 %
Neutro Abs: 5.2 10*3/uL (ref 1.7–7.7)
Neutrophils Relative %: 84 %
Platelets: 182 10*3/uL (ref 150–400)
RBC: 4.24 MIL/uL (ref 3.87–5.11)
RDW: 18.8 % — ABNORMAL HIGH (ref 11.5–15.5)
WBC: 6.2 10*3/uL (ref 4.0–10.5)

## 2017-02-11 LAB — BRAIN NATRIURETIC PEPTIDE: B Natriuretic Peptide: 16.9 pg/mL (ref 0.0–100.0)

## 2017-02-11 LAB — BASIC METABOLIC PANEL
Anion gap: 7 (ref 5–15)
BUN: 9 mg/dL (ref 6–20)
CO2: 31 mmol/L (ref 22–32)
Calcium: 8.9 mg/dL (ref 8.9–10.3)
Chloride: 102 mmol/L (ref 101–111)
Creatinine, Ser: 0.64 mg/dL (ref 0.44–1.00)
GFR calc Af Amer: >60 mL/min (ref >60)
GFR calc non Af Amer: >60 mL/min (ref >60)
Glucose, Bld: 108 mg/dL — ABNORMAL HIGH (ref 65–99)
Potassium: 3.8 mmol/L (ref 3.5–5.1)
Sodium: 140 mmol/L (ref 135–145)

## 2017-02-11 LAB — TROPONIN I: Troponin I: <0.03 ng/mL (ref <0.03)

## 2017-02-11 MED ORDER — IPRATROPIUM-ALBUTEROL 0.5-2.5 (3) MG/3ML IN SOLN
3.0000 mL | Freq: Once | RESPIRATORY_TRACT | Status: AC
  Administered 2017-02-12: 3 mL via RESPIRATORY_TRACT
  Filled 2017-02-11: qty 3

## 2017-02-11 MED ORDER — METHYLPREDNISOLONE SODIUM SUCC 125 MG IJ SOLR
80.0000 mg | Freq: Once | INTRAMUSCULAR | Status: AC
  Administered 2017-02-12: 80 mg via INTRAVENOUS
  Filled 2017-02-11: qty 2

## 2017-02-11 MED ORDER — POTASSIUM CHLORIDE CRYS ER 20 MEQ PO TBCR
40.0000 meq | EXTENDED_RELEASE_TABLET | Freq: Once | ORAL | Status: AC
  Administered 2017-02-12: 40 meq via ORAL
  Filled 2017-02-11: qty 2

## 2017-02-11 MED ORDER — FUROSEMIDE 10 MG/ML IJ SOLN
80.0000 mg | Freq: Once | INTRAMUSCULAR | Status: AC
  Administered 2017-02-12: 80 mg via INTRAVENOUS
  Filled 2017-02-11: qty 8

## 2017-02-11 NOTE — ED Notes (Signed)
Pt refused bariatric bed.

## 2017-02-11 NOTE — ED Triage Notes (Signed)
Shortness of breath for a couple of days no cough voiced no fever voiced states didn't take lasix or blood pressure medication today due to when she gets up and walks around increased shortness of breath able to speak in full sentences no pain voiced.

## 2017-02-11 NOTE — ED Notes (Signed)
Bed: RESA Expected date:  Expected time:  Means of arrival:  Comments: EMS- shortness of breath  

## 2017-02-11 NOTE — ED Provider Notes (Signed)
[name]   CSN: [id number] Arrival date & time: 02/11/17  2200     History   Chief Complaint Chief Complaint  Patient presents with  . Shortness of Breath    HPI [name] is a 51 y.o. female.  HPI   56yF with dyspnea. Baseline dyspnea, but progressively worsening and acutely worse this afternoon so called EMS. Multiple medical problems including copd, sarcoidosis, CHF, anemia, morbid obesity. She is chronically on 2L via [place]. She denies fever or chills. Denies acute pain. No cough. Doesn't weigh herself regularly. She questions wether this may be worsening anemia, HF or "sarcoid flare."   Past Medical History:  Diagnosis Date  . Anemia   . Arthritis    hands, shoulders, no meds  . CHF (congestive heart failure) (HCC)    EF60-65%  . Chronic hyperventilation syndrome    w/ obesity tx with albuterol inhaler and oxygen 2L  . COPD (chronic obstructive pulmonary disease) ([place])    uses oxygen 2 L  . Gallstones   . GERD (gastroesophageal reflux disease)    diet controlled - no meds  . H/O hiatal hernia   . Hypertension   . Hypothyroidism   . Kidney stones   . Morbid obesity ([place])   . Pneumonia    hoispitalized in 08/2011  . Sarcoidosis   . Seasonal allergies   . Shortness of breath    with exertion   . Sleep apnea    uses CPAP machine     Patient Active Problem List   Diagnosis Date Noted  . Abnormal uterine bleeding 08/09/2016  . Diastolic dysfunction with acute on chronic heart failure ([place]) 06/17/2016  . Microcytic anemia 02/27/2016  . Abdominal pain 02/27/2016  . Chronic diastolic CHF (congestive heart failure) ([place]) 02/27/2016  . Hyperlipidemia 02/24/2016  . Chronic respiratory failure ([place]) 09/21/2015  . Absolute anemia   . Anxiety 04/14/2015  . Hypoxemia   . Congestive heart failure ([place])   . Symptomatic anemia 01/15/2015  . Hypothyroidism 01/15/2015  . Morbid obesity ([place]) 01/15/2015  . Fibroids 12/09/2013  .  Hyperglycemia 09/16/2013  . Left knee pain 09/09/2012  . Sarcoidosis 09/09/2012  . Iron deficiency anemia due to chronic blood loss 09/09/2012  . Hypertension 02/25/2011  . Obesity hypoventilation syndrome ([place]) 02/14/2011  . OSA (obstructive sleep apnea) 02/10/2011    Past Surgical History:  Procedure Laterality Date  . [place]   x 1  . CHOLECYSTECTOMY  2000  . DILATION AND CURETTAGE OF UTERUS N/A 08/09/2016   Procedure: DILATATION AND CURETTAGE;  Surgeon: Everitt Amber, MD;  Location: [facility];  Service: Gynecology;  Laterality: N/A;  . HYSTEROSCOPY W/D&C  12/27/2011   Procedure: DILATATION AND CURETTAGE /HYSTEROSCOPY;  Surgeon: Maeola Sarah. Landry Mellow, MD;  Location: [facility];  Service: Gynecology;;  . I and D of abcess  05/2011  . INTRAUTERINE DEVICE (IUD) INSERTION N/A 08/09/2016   Procedure: INTRAUTERINE DEVICE (IUD) INSERTION;  Surgeon: Everitt Amber, MD;  Location: [facility];  Service: Gynecology;  Laterality: N/A;  . LUNG BIOPSY    . uterine abletion      OB History    No data available       Home Medications    Prior to Admission medications   Medication Sig Start Date End Date Taking? Authorizing Provider  albuterol (PROVENTIL HFA;VENTOLIN HFA) 108 (90 Base) MCG/ACT inhaler Inhale 1-2 puffs into the lungs every 6 (six) hours as needed for wheezing. 02/08/16   Kara Mead  V, MD  cetirizine (ZYRTEC) 10 MG tablet Take 10 mg by mouth at bedtime as needed for allergies.    Historical Provider, MD  cholecalciferol (VITAMIN D) 400 UNITS TABS tablet Take 400 Units by mouth daily.    Historical Provider, MD  Coral Calcium 1000 (390 CA) MG TABS Take 1,000 mg by mouth daily.     Historical Provider, MD  ferrous sulfate 325 (65 FE) MG tablet Take 1 tablet (325 mg total) by mouth 3 (three) times daily with meals. 08/31/15   [name], NP  folic acid (FOLVITE) 1 MG tablet Take 1 mg by mouth daily. 01/11/16   Historical Provider, MD  furosemide (LASIX) 20 MG tablet Take 3 tablets (60 mg  total) by mouth 2 (two) times daily. Patient taking differently: Take 60 mg by mouth daily.  06/19/16   Lavina Hamman, MD  HYDROcodone-acetaminophen (NORCO) 10-325 MG tablet Take 1 tablet by mouth every 6 (six) hours as needed (pain). 11/21/16   Pete Pelt, PA-C  ibuprofen (ADVIL,MOTRIN) 800 MG tablet Take 1 tablet (800 mg total) by mouth every 8 (eight) hours as needed. 08/09/16   Everitt Amber, MD  levonorgestrel (MIRENA) 20 MCG/24HR IUD 1 Intra Uterine Device (1 each total) by Intrauterine route once. 07/27/16 07/27/16  Melissa D Cross, NP  levothyroxine (SYNTHROID, LEVOTHROID) 100 MCG tablet TAKE 1 TABLET (100 MCG TOTAL) BY MOUTH DAILY AT 6 (SIX) AM. 01/24/17   [name], NP  losartan (COZAAR) 50 MG tablet TAKE 1 TABLET (50 MG TOTAL) BY MOUTH DAILY. 08/20/15   [name], NP  methotrexate 50 MG/2ML injection Inject 15 mg into the skin every Friday. Injects 0.57ml. 12/13/15   Historical Provider, MD  metoprolol succinate (TOPROL-XL) 25 MG 24 hr tablet TAKE 1 TABLET (25 MG TOTAL) BY MOUTH DAILY. 08/20/15   [name], NP  NON FORMULARY 2 liter of oxygen    Historical Provider, MD  predniSONE (DELTASONE) 10 MG tablet Take 10 mg by mouth daily. 07/15/16   Historical Provider, MD  triamcinolone acetonide (KENALOG) 40 MG/ML injection Inject 40 mg into the muscle every 3 (three) months. For knee pain    Historical Provider, MD  triamcinolone ointment (KENALOG) 0.1 % Apply 1 application topically See admin instructions. Applies twice a day as needed for break out of Sarcoidosis    Historical Provider, MD  vitamin B-12 1000 MCG tablet Take 1 tablet (1,000 mcg total) by mouth daily. 06/19/16   Lavina Hamman, MD    Family History Family History  Problem Relation Age of Onset  . Diabetes Father   . Cancer Father 1    colon cancer   . Diabetes Brother   . Deep vein thrombosis Mother   . Aneurysm Sister     d/o brain aneurysm    Social History Social History  Substance Use Topics  .  Smoking status: Never Smoker  . Smokeless tobacco: Never Used  . Alcohol use Yes     Comment: occasionally     Allergies   Patient has no known allergies.   Review of Systems Review of Systems  All systems reviewed and negative, other than as noted in HPI.   Physical Exam Updated Vital Signs BP (!) 168/67 (BP Location: Left Arm)   Pulse 99   Temp 98.6 F (37 C) (Oral)   Resp (!) 24   Ht 5\' 6"  (1.676 m)   Wt (!) 505 lb (229.1 kg)   SpO2 93%   BMI 81.51 kg/m  Physical Exam  Constitutional: She appears well-developed and well-nourished. No distress.  HENT:  Head: Normocephalic and atraumatic.  Eyes: Conjunctivae are normal. Right eye exhibits no discharge. Left eye exhibits no discharge.  Neck: Neck supple.  Cardiovascular: Normal rate, regular rhythm and normal heart sounds.  Exam reveals no gallop and no friction rub.   No murmur heard. Pulmonary/Chest:  Tachypnea. Decreased breath sounds b/l. Faint expiratory wheezing.   Abdominal: Soft. She exhibits no distension. There is no tenderness.  Musculoskeletal: She exhibits no edema or tenderness.  Neurological: She is alert.  Skin: Skin is warm and dry.  Psychiatric: She has a normal mood and affect. Her behavior is normal. Thought content normal.  Nursing Macias and vitals reviewed.    ED Treatments / Results  Labs (all labs ordered are listed, but only abnormal results are displayed) Labs Reviewed  CBC WITH DIFFERENTIAL/PLATELET - Abnormal; Notable for the following:       Result Value   Hemoglobin 11.4 (*)    RDW 18.8 (*)    Lymphs Abs 0.5 (*)    All other components within normal limits  BASIC METABOLIC PANEL - Abnormal; Notable for the following:    Glucose, Bld 108 (*)    All other components within normal limits  TROPONIN I  BRAIN NATRIURETIC PEPTIDE    EKG  EKG Interpretation  Date/Time:  Saturday February 11 2017 23:18:46 EDT Ventricular Rate:  88 PR Interval:    QRS Duration: 88 QT  Interval:  372 QTC Calculation: 451 R Axis:   91 Text Interpretation:  Sinus rhythm Borderline right axis deviation Confirmed by Wilson Singer  MD, Eydie Wormley [id number]) on 02/11/2017 11:35:18 PM       Radiology Dg Chest Portable 1 View  Result Date: 02/11/2017 CLINICAL DATA:  Dyspnea EXAM: PORTABLE CHEST 1 VIEW COMPARISON:  Chest radiograph 07/28/2016 FINDINGS: Cardiac silhouette is enlarged. Prominent interstitial markings are unchanged. No pneumothorax or pleural effusion. No focal consolidation. IMPRESSION: Unchanged cardiomegaly without focal airspace disease. Electronically Signed   By: Ulyses Jarred M.D.   On: 02/11/2017 22:48    Procedures Procedures (including critical care time)  Medications Ordered in ED Medications - No data to display   Initial Impression / Assessment and Plan / ED Course  I have reviewed the triage vital signs and the nursing notes.  Pertinent labs & imaging results that were available during my care of the patient were reviewed by me and considered in my medical decision making (see chart for details).     50yF with dyspnea. Not significantly anemic. CXR w/o overt edema. Her body habitus limits exam. Perhaps some faint expiratory wheezing? Nebs and steroids. Seems atypical for ACS. On methotrexate. I doubt infectious though. CXR w/o infiltrate. Afebrile. No leukocytosis. No new cough.   Final Clinical Impressions(s) / ED Diagnoses   Final diagnoses:  Hypoxia    New Prescriptions New Prescriptions   No medications on file     Virgel Manifold, MD 02/23/17 1553

## 2017-02-12 ENCOUNTER — Encounter (HOSPITAL_COMMUNITY): Payer: Self-pay | Admitting: Internal Medicine

## 2017-02-12 DIAGNOSIS — Z9981 Dependence on supplemental oxygen: Secondary | ICD-10-CM | POA: Diagnosis not present

## 2017-02-12 DIAGNOSIS — Z79899 Other long term (current) drug therapy: Secondary | ICD-10-CM | POA: Diagnosis not present

## 2017-02-12 DIAGNOSIS — Z7952 Long term (current) use of systemic steroids: Secondary | ICD-10-CM | POA: Diagnosis not present

## 2017-02-12 DIAGNOSIS — K219 Gastro-esophageal reflux disease without esophagitis: Secondary | ICD-10-CM | POA: Diagnosis present

## 2017-02-12 DIAGNOSIS — E039 Hypothyroidism, unspecified: Secondary | ICD-10-CM | POA: Diagnosis present

## 2017-02-12 DIAGNOSIS — Z833 Family history of diabetes mellitus: Secondary | ICD-10-CM | POA: Diagnosis not present

## 2017-02-12 DIAGNOSIS — J441 Chronic obstructive pulmonary disease with (acute) exacerbation: Principal | ICD-10-CM

## 2017-02-12 DIAGNOSIS — I5033 Acute on chronic diastolic (congestive) heart failure: Secondary | ICD-10-CM | POA: Diagnosis not present

## 2017-02-12 DIAGNOSIS — Z975 Presence of (intrauterine) contraceptive device: Secondary | ICD-10-CM | POA: Diagnosis not present

## 2017-02-12 DIAGNOSIS — E038 Other specified hypothyroidism: Secondary | ICD-10-CM | POA: Diagnosis not present

## 2017-02-12 DIAGNOSIS — R739 Hyperglycemia, unspecified: Secondary | ICD-10-CM | POA: Diagnosis not present

## 2017-02-12 DIAGNOSIS — I110 Hypertensive heart disease with heart failure: Secondary | ICD-10-CM | POA: Diagnosis present

## 2017-02-12 DIAGNOSIS — J9811 Atelectasis: Secondary | ICD-10-CM | POA: Diagnosis not present

## 2017-02-12 DIAGNOSIS — G4733 Obstructive sleep apnea (adult) (pediatric): Secondary | ICD-10-CM | POA: Diagnosis not present

## 2017-02-12 DIAGNOSIS — K8020 Calculus of gallbladder without cholecystitis without obstruction: Secondary | ICD-10-CM | POA: Diagnosis present

## 2017-02-12 DIAGNOSIS — Z6841 Body Mass Index (BMI) 40.0 and over, adult: Secondary | ICD-10-CM | POA: Diagnosis not present

## 2017-02-12 DIAGNOSIS — Z87442 Personal history of urinary calculi: Secondary | ICD-10-CM | POA: Diagnosis not present

## 2017-02-12 DIAGNOSIS — I272 Pulmonary hypertension, unspecified: Secondary | ICD-10-CM | POA: Diagnosis present

## 2017-02-12 DIAGNOSIS — J9621 Acute and chronic respiratory failure with hypoxia: Secondary | ICD-10-CM | POA: Diagnosis not present

## 2017-02-12 DIAGNOSIS — Z9049 Acquired absence of other specified parts of digestive tract: Secondary | ICD-10-CM | POA: Diagnosis not present

## 2017-02-12 DIAGNOSIS — Z8701 Personal history of pneumonia (recurrent): Secondary | ICD-10-CM | POA: Diagnosis not present

## 2017-02-12 DIAGNOSIS — R0902 Hypoxemia: Secondary | ICD-10-CM | POA: Diagnosis not present

## 2017-02-12 DIAGNOSIS — I509 Heart failure, unspecified: Secondary | ICD-10-CM | POA: Diagnosis not present

## 2017-02-12 DIAGNOSIS — K449 Diaphragmatic hernia without obstruction or gangrene: Secondary | ICD-10-CM | POA: Diagnosis present

## 2017-02-12 DIAGNOSIS — I10 Essential (primary) hypertension: Secondary | ICD-10-CM | POA: Diagnosis not present

## 2017-02-12 DIAGNOSIS — D869 Sarcoidosis, unspecified: Secondary | ICD-10-CM | POA: Diagnosis not present

## 2017-02-12 DIAGNOSIS — Z713 Dietary counseling and surveillance: Secondary | ICD-10-CM | POA: Diagnosis not present

## 2017-02-12 LAB — CBC WITH DIFFERENTIAL/PLATELET
BASOS ABS: 0 10*3/uL (ref 0.0–0.1)
Basophils Relative: 0 %
EOS ABS: 0 10*3/uL (ref 0.0–0.7)
EOS PCT: 0 %
HCT: 40.7 % (ref 36.0–46.0)
Hemoglobin: 12.6 g/dL (ref 12.0–15.0)
LYMPHS PCT: 3 %
Lymphs Abs: 0.3 10*3/uL — ABNORMAL LOW (ref 0.7–4.0)
MCH: 26.4 pg (ref 26.0–34.0)
MCHC: 31 g/dL (ref 30.0–36.0)
MCV: 85.3 fL (ref 78.0–100.0)
MONO ABS: 0.1 10*3/uL (ref 0.1–1.0)
Monocytes Relative: 1 %
Neutro Abs: 8.4 10*3/uL — ABNORMAL HIGH (ref 1.7–7.7)
Neutrophils Relative %: 96 %
PLATELETS: 278 10*3/uL (ref 150–400)
RBC: 4.77 MIL/uL (ref 3.87–5.11)
RDW: 18.7 % — AB (ref 11.5–15.5)
WBC: 8.7 10*3/uL (ref 4.0–10.5)

## 2017-02-12 LAB — COMPREHENSIVE METABOLIC PANEL
ALT: 22 U/L (ref 14–54)
AST: 25 U/L (ref 15–41)
Albumin: 4 g/dL (ref 3.5–5.0)
Alkaline Phosphatase: 38 U/L (ref 38–126)
Anion gap: 11 (ref 5–15)
BUN: 9 mg/dL (ref 6–20)
CALCIUM: 9.1 mg/dL (ref 8.9–10.3)
CHLORIDE: 98 mmol/L — AB (ref 101–111)
CO2: 30 mmol/L (ref 22–32)
Creatinine, Ser: 0.62 mg/dL (ref 0.44–1.00)
Glucose, Bld: 156 mg/dL — ABNORMAL HIGH (ref 65–99)
POTASSIUM: 3.9 mmol/L (ref 3.5–5.1)
SODIUM: 139 mmol/L (ref 135–145)
TOTAL PROTEIN: 8 g/dL (ref 6.5–8.1)
Total Bilirubin: 0.8 mg/dL (ref 0.3–1.2)

## 2017-02-12 LAB — GLUCOSE, CAPILLARY
GLUCOSE-CAPILLARY: 163 mg/dL — AB (ref 65–99)
Glucose-Capillary: 126 mg/dL — ABNORMAL HIGH (ref 65–99)
Glucose-Capillary: 181 mg/dL — ABNORMAL HIGH (ref 65–99)
Glucose-Capillary: 167 mg/dL — ABNORMAL HIGH (ref 65–99)
Glucose-Capillary: 227 mg/dL — ABNORMAL HIGH (ref 65–99)

## 2017-02-12 LAB — MAGNESIUM: MAGNESIUM: 1.3 mg/dL — AB (ref 1.7–2.4)

## 2017-02-12 LAB — PHOSPHORUS: PHOSPHORUS: 3.3 mg/dL (ref 2.5–4.6)

## 2017-02-12 MED ORDER — SODIUM CHLORIDE 0.9% FLUSH
3.0000 mL | Freq: Two times a day (BID) | INTRAVENOUS | Status: DC
  Administered 2017-02-12 – 2017-02-16 (×10): 3 mL via INTRAVENOUS

## 2017-02-12 MED ORDER — DEXTROSE 5 % IV SOLN
500.0000 mg | INTRAVENOUS | Status: DC
  Administered 2017-02-12 – 2017-02-14 (×3): 500 mg via INTRAVENOUS
  Filled 2017-02-12 (×4): qty 500

## 2017-02-12 MED ORDER — ENOXAPARIN SODIUM 40 MG/0.4ML ~~LOC~~ SOLN
40.0000 mg | SUBCUTANEOUS | Status: DC
  Administered 2017-02-12: 40 mg via SUBCUTANEOUS
  Filled 2017-02-12: qty 0.4

## 2017-02-12 MED ORDER — FAMOTIDINE 20 MG PO TABS
40.0000 mg | ORAL_TABLET | Freq: Two times a day (BID) | ORAL | Status: DC
  Administered 2017-02-12 – 2017-02-16 (×10): 40 mg via ORAL
  Filled 2017-02-12 (×12): qty 2

## 2017-02-12 MED ORDER — FUROSEMIDE 40 MG PO TABS
60.0000 mg | ORAL_TABLET | Freq: Every day | ORAL | Status: DC
  Administered 2017-02-12: 60 mg via ORAL
  Filled 2017-02-12: qty 1

## 2017-02-12 MED ORDER — MAGNESIUM SULFATE 4 GM/100ML IV SOLN
4.0000 g | Freq: Once | INTRAVENOUS | Status: AC
  Administered 2017-02-12: 4 g via INTRAVENOUS
  Filled 2017-02-12 (×2): qty 100

## 2017-02-12 MED ORDER — POTASSIUM CHLORIDE CRYS ER 20 MEQ PO TBCR
40.0000 meq | EXTENDED_RELEASE_TABLET | Freq: Every day | ORAL | Status: DC
  Administered 2017-02-12 – 2017-02-16 (×5): 40 meq via ORAL
  Filled 2017-02-12 (×5): qty 2

## 2017-02-12 MED ORDER — METHYLPREDNISOLONE SODIUM SUCC 125 MG IJ SOLR
80.0000 mg | Freq: Four times a day (QID) | INTRAMUSCULAR | Status: DC
  Administered 2017-02-12 – 2017-02-15 (×15): 80 mg via INTRAVENOUS
  Filled 2017-02-12 (×15): qty 2

## 2017-02-12 MED ORDER — CLONIDINE HCL 0.1 MG PO TABS
0.2000 mg | ORAL_TABLET | Freq: Once | ORAL | Status: AC | PRN
  Administered 2017-02-15: 0.2 mg via ORAL
  Filled 2017-02-12: qty 2

## 2017-02-12 MED ORDER — DEXTROSE 5 % IV SOLN
1.0000 g | INTRAVENOUS | Status: DC
  Administered 2017-02-12 – 2017-02-14 (×3): 1 g via INTRAVENOUS
  Filled 2017-02-12 (×4): qty 10

## 2017-02-12 MED ORDER — VITAMIN B-12 1000 MCG PO TABS
1000.0000 ug | ORAL_TABLET | Freq: Every day | ORAL | Status: DC
  Administered 2017-02-12 – 2017-02-16 (×5): 1000 ug via ORAL
  Filled 2017-02-12 (×5): qty 1

## 2017-02-12 MED ORDER — INSULIN ASPART 100 UNIT/ML ~~LOC~~ SOLN
0.0000 [IU] | Freq: Three times a day (TID) | SUBCUTANEOUS | Status: DC
  Administered 2017-02-12 (×3): 4 [IU] via SUBCUTANEOUS
  Administered 2017-02-13: 2 [IU] via SUBCUTANEOUS
  Administered 2017-02-13: 7 [IU] via SUBCUTANEOUS
  Administered 2017-02-13 – 2017-02-14 (×2): 4 [IU] via SUBCUTANEOUS
  Administered 2017-02-14: 3 [IU] via SUBCUTANEOUS
  Administered 2017-02-14 – 2017-02-15 (×3): 4 [IU] via SUBCUTANEOUS
  Administered 2017-02-15: 7 [IU] via SUBCUTANEOUS
  Administered 2017-02-16 (×2): 4 [IU] via SUBCUTANEOUS

## 2017-02-12 MED ORDER — GUAIFENESIN ER 600 MG PO TB12
1200.0000 mg | ORAL_TABLET | Freq: Two times a day (BID) | ORAL | Status: DC
  Administered 2017-02-12 – 2017-02-16 (×9): 1200 mg via ORAL
  Filled 2017-02-12 (×9): qty 2

## 2017-02-12 MED ORDER — IPRATROPIUM-ALBUTEROL 0.5-2.5 (3) MG/3ML IN SOLN
3.0000 mL | Freq: Four times a day (QID) | RESPIRATORY_TRACT | Status: DC
Start: 2017-02-12 — End: 2017-02-12
  Administered 2017-02-12: 3 mL via RESPIRATORY_TRACT
  Filled 2017-02-12: qty 3

## 2017-02-12 MED ORDER — METOPROLOL SUCCINATE ER 25 MG PO TB24
25.0000 mg | ORAL_TABLET | Freq: Every day | ORAL | Status: DC
Start: 2017-02-12 — End: 2017-02-16
  Administered 2017-02-12 – 2017-02-16 (×5): 25 mg via ORAL
  Filled 2017-02-12 (×5): qty 1

## 2017-02-12 MED ORDER — FERROUS SULFATE 325 (65 FE) MG PO TABS
325.0000 mg | ORAL_TABLET | Freq: Three times a day (TID) | ORAL | Status: DC
  Administered 2017-02-12 – 2017-02-16 (×13): 325 mg via ORAL
  Filled 2017-02-12 (×13): qty 1

## 2017-02-12 MED ORDER — TRIAMCINOLONE ACETONIDE 0.1 % EX OINT
1.0000 "application " | TOPICAL_OINTMENT | Freq: Two times a day (BID) | CUTANEOUS | Status: DC | PRN
  Filled 2017-02-12 (×2): qty 15

## 2017-02-12 MED ORDER — LORATADINE 10 MG PO TABS
10.0000 mg | ORAL_TABLET | Freq: Every day | ORAL | Status: DC
  Administered 2017-02-12 – 2017-02-16 (×5): 10 mg via ORAL
  Filled 2017-02-12 (×5): qty 1

## 2017-02-12 MED ORDER — FUROSEMIDE 10 MG/ML IJ SOLN
80.0000 mg | Freq: Two times a day (BID) | INTRAMUSCULAR | Status: DC
  Administered 2017-02-12 – 2017-02-16 (×9): 80 mg via INTRAVENOUS
  Filled 2017-02-12 (×9): qty 8

## 2017-02-12 MED ORDER — LEVOTHYROXINE SODIUM 100 MCG PO TABS
100.0000 ug | ORAL_TABLET | Freq: Every day | ORAL | Status: DC
  Administered 2017-02-12 – 2017-02-16 (×5): 100 ug via ORAL
  Filled 2017-02-12 (×5): qty 1

## 2017-02-12 MED ORDER — HYDROCODONE-ACETAMINOPHEN 10-325 MG PO TABS
1.0000 | ORAL_TABLET | Freq: Four times a day (QID) | ORAL | Status: DC | PRN
  Administered 2017-02-12 – 2017-02-15 (×4): 1 via ORAL
  Filled 2017-02-12 (×4): qty 1

## 2017-02-12 MED ORDER — ENOXAPARIN SODIUM 100 MG/ML ~~LOC~~ SOLN
100.0000 mg | SUBCUTANEOUS | Status: DC
  Administered 2017-02-13 – 2017-02-16 (×4): 100 mg via SUBCUTANEOUS
  Filled 2017-02-12 (×5): qty 1

## 2017-02-12 MED ORDER — LOSARTAN POTASSIUM 50 MG PO TABS
50.0000 mg | ORAL_TABLET | Freq: Every day | ORAL | Status: DC
  Administered 2017-02-12: 50 mg via ORAL
  Filled 2017-02-12: qty 1

## 2017-02-12 MED ORDER — IPRATROPIUM-ALBUTEROL 0.5-2.5 (3) MG/3ML IN SOLN
3.0000 mL | Freq: Three times a day (TID) | RESPIRATORY_TRACT | Status: DC
Start: 2017-02-12 — End: 2017-02-15
  Administered 2017-02-12 – 2017-02-14 (×9): 3 mL via RESPIRATORY_TRACT
  Filled 2017-02-12 (×9): qty 3

## 2017-02-12 MED ORDER — FOLIC ACID 1 MG PO TABS
1.0000 mg | ORAL_TABLET | Freq: Every day | ORAL | Status: DC
  Administered 2017-02-12 – 2017-02-16 (×5): 1 mg via ORAL
  Filled 2017-02-12 (×5): qty 1

## 2017-02-12 NOTE — Progress Notes (Signed)
Patient had a 6 beat run of vtach. VSS. Pt asymptomatic. NP on call notified

## 2017-02-12 NOTE — ED Notes (Signed)
Patient brought a bedside commode. Patient states she can use it herself

## 2017-02-12 NOTE — ED Notes (Signed)
Unit called to get patient weighed before approving bed.

## 2017-02-12 NOTE — ED Notes (Signed)
Waiting on [facility] to come to talk to patient

## 2017-02-12 NOTE — Progress Notes (Signed)
PROGRESS NOTE  [name]  WCH:[id number] DOB: 1966-10-30 DOA: 02/11/2017 PCP: [name], NP Outpatient Specialists:  Subjective: Seeing sitting at bedside, denies any fever or chills. Still very short of breath especially with getting up, has orthopnea.  Brief Narrative:  [name] is a 51 y.o. female with medical history significant of anemia, osteoarthritis, diastolic CHF, COPD on home oxygen, sarcoidosis, GERD/hiatal hernia, cholelithiasis, hypertension, hypothyroidism, urolithiasis, morbid obesity history of pneumonia in 2012, sleep apnea on CPAP is coming to the emergency department with a history of 2-3 days of progressively worse shortness of breath and wheezing. She complains of orthopnea and lower extremity edema. She denies productive cough, fever, chills, chest pain, palpitations, dizziness, diaphoresis, abdominal pain, nausea, emesis, diarrhea, constipation, melena, dysuria, hematuria or frequency.  Assessment & Plan:   Principal Problem:   COPD exacerbation ([place]) Active Problems:   OSA (obstructive sleep apnea)   Hypertension   Sarcoidosis   Hyperglycemia   Hypothyroidism   Chronic diastolic CHF (congestive heart failure) (HCC)   GERD (gastroesophageal reflux disease)  This is a no charge note, patient seen earlier today by my colleague Dr. Olevia Bowens. Patient came in to the hospital with SOB, wheezing and cough. Appears to have acute COPD exacerbation and acute on chronic diastolic CHF.  Acute COPD exacerbation ([place]) Admitted to telemetry, continued to have wheezing, SOB and cough. Started on Solu-Medrol, add antibiotics Continue supportive management with bronchodilators, mucolytics, antitussives and oxygen as needed.  Acute on chronic diastolic CHF (congestive heart failure) (HCC) Appears to have volume overload, clinically she is very orthopneic. Start IV furosemide, follow weight daily, follow intake/output. Hold valsartan and continue  metoprolol.  Sarcoidosis Hold prednisone while on IV methylprednisolone in the hospital. Continue weekly methotrexate injections at home. Continue Kenalog cream for skin manifestations.  OSA (obstructive sleep apnea) Continue CPAP at bedtime.  Hypertension Hyperglycemia Carbohydrate modified diet. CBG monitoring with regular insulin sliding scale in the hospital.  Hypothyroidism Continue levothyroxine 100 g by mouth daily. Monitor TSH as an outpatient.  GERD (gastroesophageal reflux disease) Famotidine 40 mg by mouth twice a day.    DVT prophylaxis:  Code Status: Full Code Family Communication:  Disposition Plan:  Diet: Diet heart healthy/carb modified Room service appropriate? Yes; Fluid consistency: Thin  Consultants:   None  Procedures:   None  Antimicrobials:    None  Objective: Vitals:   02/12/17 0146 02/12/17 0205 02/12/17 0303 02/12/17 0750  BP:  (!) 173/93    Pulse:  (!) 102  94  Resp:  (!) 24 20 18   Temp:  99.1 F (37.3 C)    TempSrc:  Oral    SpO2: 100% 93%  94%  Weight:  (!) 228.6 kg (503 lb 15.5 oz)    Height:  5\' 6"  (1.676 m)      Intake/Output Summary (Last 24 hours) at 02/12/17 1124 Last data filed at 02/12/17 0617  Gross per 24 hour  Intake                0 ml  Output             1000 ml  Net            -1000 ml   Filed Weights   02/11/17 2212 02/12/17 0050 02/12/17 0205  Weight: (!) 229.1 kg (505 lb) (!) 228.7 kg (504 lb 1.6 oz) (!) 228.6 kg (503 lb 15.5 oz)    Examination: General exam: Appears calm and comfortable  Respiratory system: Clear to  auscultation. Respiratory effort normal. Cardiovascular system: S1 & S2 heard, RRR. No JVD, murmurs, rubs, gallops or clicks. No pedal edema. Gastrointestinal system: Abdomen is nondistended, soft and nontender. No organomegaly or masses felt. Normal bowel sounds heard. Central nervous system: Alert and oriented. No focal neurological deficits. Extremities: Symmetric 5 x 5  power. Skin: No rashes, lesions or ulcers Psychiatry: Judgement and insight appear normal. Mood & affect appropriate.   Data Reviewed: I have personally reviewed following labs and imaging studies  CBC:  Recent Labs Lab 02/11/17 2249 02/12/17 0734  WBC 6.2 8.7  NEUTROABS 5.2 8.4*  HGB 11.4* 12.6  HCT 36.7 40.7  MCV 86.6 85.3  PLT 182 127   Basic Metabolic Panel:  Recent Labs Lab 02/11/17 2249 02/12/17 0130 02/12/17 0734  NA 140  --  139  K 3.8  --  3.9  CL 102  --  98*  CO2 31  --  30  GLUCOSE 108*  --  156*  BUN 9  --  9  CREATININE 0.64  --  0.62  CALCIUM 8.9  --  9.1  MG  --  1.3*  --   PHOS  --  3.3  --    GFR: Estimated Creatinine Clearance: 166.8 mL/min (by C-G formula based on SCr of 0.62 mg/dL). Liver Function Tests:  Recent Labs Lab 02/12/17 0734  AST 25  ALT 22  ALKPHOS 38  BILITOT 0.8  PROT 8.0  ALBUMIN 4.0   No results for input(s): LIPASE, AMYLASE in the last 168 hours. No results for input(s): AMMONIA in the last 168 hours. Coagulation Profile: No results for input(s): INR, PROTIME in the last 168 hours. Cardiac Enzymes:  Recent Labs Lab 02/11/17 2249  TROPONINI <0.03   BNP (last 3 results) No results for input(s): PROBNP in the last 8760 hours. HbA1C: No results for input(s): HGBA1C in the last 72 hours. CBG:  Recent Labs Lab 02/12/17 0223 02/12/17 0905  GLUCAP 126* 163*   Lipid Profile: No results for input(s): CHOL, HDL, LDLCALC, TRIG, CHOLHDL, LDLDIRECT in the last 72 hours. Thyroid Function Tests: No results for input(s): TSH, T4TOTAL, FREET4, T3FREE, THYROIDAB in the last 72 hours. Anemia Panel: No results for input(s): VITAMINB12, FOLATE, FERRITIN, TIBC, IRON, RETICCTPCT in the last 72 hours. Urine analysis:    Component Value Date/Time   COLORURINE YELLOW 06/16/2016 0100   APPEARANCEUR CLOUDY (A) 06/16/2016 0100   LABSPEC 1.021 06/16/2016 0100   PHURINE 6.0 06/16/2016 0100   GLUCOSEU NEGATIVE 06/16/2016 0100     HGBUR LARGE (A) 06/16/2016 0100   BILIRUBINUR NEGATIVE 06/16/2016 0100   KETONESUR NEGATIVE 06/16/2016 0100   PROTEINUR NEGATIVE 06/16/2016 0100   UROBILINOGEN 0.2 09/10/2012 0106   NITRITE NEGATIVE 06/16/2016 0100   LEUKOCYTESUR NEGATIVE 06/16/2016 0100   Sepsis Labs: @LABRCNTIP (procalcitonin:4,lacticidven:4)  )No results found for this or any previous visit (from the past 240 hour(s)).   Invalid input(s): PROCALCITONIN, LACTICACIDVEN   Radiology Studies: Dg Chest Portable 1 View  Result Date: 02/11/2017 CLINICAL DATA:  Dyspnea EXAM: PORTABLE CHEST 1 VIEW COMPARISON:  Chest radiograph 07/28/2016 FINDINGS: Cardiac silhouette is enlarged. Prominent interstitial markings are unchanged. No pneumothorax or pleural effusion. No focal consolidation. IMPRESSION: Unchanged cardiomegaly without focal airspace disease. Electronically Signed   By: Ulyses Jarred M.D.   On: 02/11/2017 22:48        Scheduled Meds: . enoxaparin (LOVENOX) injection  40 mg Subcutaneous Q24H  . famotidine  40 mg Oral BID  . ferrous sulfate  325 mg  Oral TID WC  . folic acid  1 mg Oral Daily  . furosemide  60 mg Oral Daily  . insulin aspart  0-20 Units Subcutaneous TID WC  . ipratropium-albuterol  3 mL Nebulization TID  . levothyroxine  100 mcg Oral Q0600  . loratadine  10 mg Oral Daily  . losartan  50 mg Oral Daily  . methylPREDNISolone (SOLU-MEDROL) injection  80 mg Intravenous Q6H  . metoprolol succinate  25 mg Oral Daily  . sodium chloride flush  3 mL Intravenous Q12H  . cyanocobalamin  1,000 mcg Oral Daily   Continuous Infusions:   LOS: 0 days    Time spent: 35 minutes    Avry Monteleone A, MD [facility] Pager [phone]  If 7PM-7AM, please contact night-coverage [email or web address] Password [facility] 02/12/2017, 11:24 AM

## 2017-02-12 NOTE — Progress Notes (Signed)
Pt did not want a [place] bed with air mattress. She was given the old [place] bed w/o air mattress. Pt refused to change into hospital gown or have skin assessed.

## 2017-02-12 NOTE — ED Notes (Signed)
Called unit to see status of the bed and charge nurse was waiting on bed to get to the floor.

## 2017-02-12 NOTE — H&P (Signed)
History and Physical    [name] FWY:[id number] DOB: 05-19-1966 DOA: 02/11/2017  PCP: [name], NP   Patient coming from: Home.  I have personally briefly reviewed patient's old medical records in [place]  Chief Complaint: Shortness of breath.  HPI: [name] is a 51 y.o. female with medical history significant of anemia, osteoarthritis, diastolic CHF, COPD on home oxygen, sarcoidosis, GERD/hiatal hernia, cholelithiasis, hypertension, hypothyroidism, urolithiasis, morbid obesity history of pneumonia in 2012, sleep apnea on CPAP is coming to the emergency department with a history of 2-3 days of progressively worse shortness of breath and wheezing. She complains of orthopnea and lower extremity edema. She denies productive cough, fever, chills, chest pain, palpitations, dizziness, diaphoresis, abdominal pain, nausea, emesis, diarrhea, constipation, melena, dysuria, hematuria or frequency.  ED Course: The patient received supplemental oxygen, bronchodilators and furosemide 80 mg IVP with oral potassium supplementation in the emergency department reporting relief of her symptoms. EKG was sinus rhythm with borderline right axis deviation and repolarization abnormality. BNP and troponin were within normal limits. WBC 6.2, hemoglobin 11.4 g/dL and platelets 182. Her sodium 140, potassium 3.8, chloride 102, bicarbonate 31 mmol/L. BUN was 9, creatinine 0.64 and glucose 108 mg/dL. Her chest radiograph did not show any acute abnormalities. Please see full radiology report for further detail.  Review of Systems: As per HPI otherwise 10 point review of systems negative.    Past Medical History:  Diagnosis Date  . Anemia   . Arthritis    hands, shoulders, no meds  . CHF (congestive heart failure) (HCC)    EF60-65%  . Chronic hyperventilation syndrome    w/ obesity tx with albuterol inhaler and oxygen 2L  . COPD (chronic obstructive pulmonary disease) ([place])    uses oxygen 2 L  . Gallstones   . GERD (gastroesophageal reflux disease)    diet controlled - no meds  . H/O hiatal hernia   . Hypertension   . Hypothyroidism   . Kidney stones   . Morbid obesity ([place])   . Pneumonia    hoispitalized in 08/2011  . Sarcoidosis   . Seasonal allergies   . Shortness of breath    with exertion   . Sleep apnea    uses CPAP machine     Past Surgical History:  Procedure Laterality Date  . [place]   x 1  . CHOLECYSTECTOMY  2000  . DILATION AND CURETTAGE OF UTERUS N/A 08/09/2016   Procedure: DILATATION AND CURETTAGE;  Surgeon: Everitt Amber, MD;  Location: [facility];  Service: Gynecology;  Laterality: N/A;  . HYSTEROSCOPY W/D&C  12/27/2011   Procedure: DILATATION AND CURETTAGE /HYSTEROSCOPY;  Surgeon: Maeola Sarah. Landry Mellow, MD;  Location: [facility];  Service: Gynecology;;  . I and D of abcess  05/2011  . INTRAUTERINE DEVICE (IUD) INSERTION N/A 08/09/2016   Procedure: INTRAUTERINE DEVICE (IUD) INSERTION;  Surgeon: Everitt Amber, MD;  Location: [facility];  Service: Gynecology;  Laterality: N/A;  . LUNG BIOPSY    . uterine abletion       reports that she has never smoked. She has never used smokeless tobacco. She reports that she drinks alcohol. She reports that she does not use drugs.  No Known Allergies  Family History  Problem Relation Age of Onset  . Diabetes Father   . Cancer Father 11    colon cancer   . Diabetes Brother   . Deep vein thrombosis Mother   . Aneurysm Sister  d/o brain aneurysm    Prior to Admission medications   Medication Sig Start Date End Date Taking? Authorizing Provider  albuterol (PROVENTIL HFA;VENTOLIN HFA) 108 (90 Base) MCG/ACT inhaler Inhale 1-2 puffs into the lungs every 6 (six) hours as needed for wheezing. 02/08/16  Yes Rigoberto Noel, MD  cetirizine (ZYRTEC) 10 MG tablet Take 10 mg by mouth at bedtime as needed for allergies.   Yes Historical Provider, MD  cholecalciferol (VITAMIN D) 400 UNITS TABS tablet Take 400 Units  by mouth daily.   Yes Historical Provider, MD  Coral Calcium 1000 (390 CA) MG TABS Take 1,000 mg by mouth daily.    Yes Historical Provider, MD  ferrous sulfate 325 (65 FE) MG tablet Take 1 tablet (325 mg total) by mouth 3 (three) times daily with meals. 08/31/15  Yes [name], NP  folic acid (FOLVITE) 1 MG tablet Take 1 mg by mouth daily. 01/11/16  Yes Historical Provider, MD  furosemide (LASIX) 20 MG tablet Take 3 tablets (60 mg total) by mouth 2 (two) times daily. Patient taking differently: Take 60 mg by mouth daily.  06/19/16  Yes Lavina Hamman, MD  HYDROcodone-acetaminophen (NORCO) 10-325 MG tablet Take 1 tablet by mouth every 6 (six) hours as needed (pain). 11/21/16  Yes Pete Pelt, PA-C  levothyroxine (SYNTHROID, LEVOTHROID) 100 MCG tablet TAKE 1 TABLET (100 MCG TOTAL) BY MOUTH DAILY AT 6 (SIX) AM. 01/24/17  Yes [name], NP  losartan (COZAAR) 50 MG tablet TAKE 1 TABLET (50 MG TOTAL) BY MOUTH DAILY. 08/20/15  Yes [name], NP  methotrexate 50 MG/2ML injection Inject 15 mg into the skin every Friday. Injects 0.20ml. 12/13/15  Yes Historical Provider, MD  metoprolol succinate (TOPROL-XL) 25 MG 24 hr tablet TAKE 1 TABLET (25 MG TOTAL) BY MOUTH DAILY. 08/20/15  Yes [name], NP  predniSONE (DELTASONE) 10 MG tablet Take 10 mg by mouth daily. 07/15/16  Yes Historical Provider, MD  triamcinolone acetonide (KENALOG) 40 MG/ML injection Inject 40 mg into the muscle every 3 (three) months. For knee pain   Yes Historical Provider, MD  triamcinolone ointment (KENALOG) 0.1 % Apply 1 application topically See admin instructions. Applies twice a day as needed for break out of Sarcoidosis   Yes Historical Provider, MD  vitamin B-12 1000 MCG tablet Take 1 tablet (1,000 mcg total) by mouth daily. 06/19/16  Yes Lavina Hamman, MD  ibuprofen (ADVIL,MOTRIN) 800 MG tablet Take 1 tablet (800 mg total) by mouth every 8 (eight) hours as needed. Patient not taking: Reported on 02/12/2017 08/09/16    Everitt Amber, MD  levonorgestrel (MIRENA) 20 MCG/24HR IUD 1 Intra Uterine Device (1 each total) by Intrauterine route once. 07/27/16 07/27/16  Dorothyann Gibbs, NP  NON FORMULARY 2 liter of oxygen    Historical Provider, MD    Physical Exam:  Constitutional: NAD, calm, comfortable Vitals:   02/11/17 2330 02/12/17 0000 02/12/17 0030 02/12/17 0050  BP: (!) 171/72 (!) 165/73 (!) 175/74   Pulse: 86 87 96   Resp: (!) 22 (!) 21 (!) 25   Temp:      TempSrc:      SpO2: 97% 95% 99%   Weight:    (!) 228.7 kg (504 lb 1.6 oz)  Height:       Eyes: PERRL, lids and conjunctivae normal ENMT: Mucous membranes are moist. Posterior pharynx clear of any exudate or lesions. Neck: normal, supple, no masses, no thyromegaly Respiratory: Decreased breath sounds on both lung fields with  bilateral wheezing. No rhonchi or crackles. No accessory muscle use.  Cardiovascular: Regular rate and rhythm, no murmurs / rubs / gallops. No extremity edema. 2+ pedal pulses. No carotid bruits.  Abdomen: Obese, soft, no tenderness, no masses palpated. No hepatosplenomegaly. Bowel sounds positive.  Musculoskeletal: no clubbing / cyanosis.  Good ROM, no contractures. Normal muscle tone.  Skin: Positive every trailer made lesions in lower extremities. Neurologic: CN 2-12 grossly intact. Sensation intact, DTR normal. Strength 5/5 in all 4.  Psychiatric: Normal judgment and insight. Alert and oriented x 3. Normal mood.    Labs on Admission: I have personally reviewed following labs and imaging studies  CBC:  Recent Labs Lab 02/11/17 2249  WBC 6.2  NEUTROABS 5.2  HGB 11.4*  HCT 36.7  MCV 86.6  PLT 952   Basic Metabolic Panel:  Recent Labs Lab 02/11/17 2249  NA 140  K 3.8  CL 102  CO2 31  GLUCOSE 108*  BUN 9  CREATININE 0.64  CALCIUM 8.9   GFR: Estimated Creatinine Clearance: 166.9 mL/min (by C-G formula based on SCr of 0.64 mg/dL). Liver Function Tests: No results for input(s): AST, ALT, ALKPHOS, BILITOT,  PROT, ALBUMIN in the last 168 hours. No results for input(s): LIPASE, AMYLASE in the last 168 hours. No results for input(s): AMMONIA in the last 168 hours. Coagulation Profile: No results for input(s): INR, PROTIME in the last 168 hours. Cardiac Enzymes:  Recent Labs Lab 02/11/17 2249  TROPONINI <0.03   BNP (last 3 results) No results for input(s): PROBNP in the last 8760 hours. HbA1C: No results for input(s): HGBA1C in the last 72 hours. CBG: No results for input(s): GLUCAP in the last 168 hours. Lipid Profile: No results for input(s): CHOL, HDL, LDLCALC, TRIG, CHOLHDL, LDLDIRECT in the last 72 hours. Thyroid Function Tests: No results for input(s): TSH, T4TOTAL, FREET4, T3FREE, THYROIDAB in the last 72 hours. Anemia Panel: No results for input(s): VITAMINB12, FOLATE, FERRITIN, TIBC, IRON, RETICCTPCT in the last 72 hours. Urine analysis:    Component Value Date/Time   COLORURINE YELLOW 06/16/2016 0100   APPEARANCEUR CLOUDY (A) 06/16/2016 0100   LABSPEC 1.021 06/16/2016 0100   PHURINE 6.0 06/16/2016 0100   GLUCOSEU NEGATIVE 06/16/2016 0100   HGBUR LARGE (A) 06/16/2016 0100   BILIRUBINUR NEGATIVE 06/16/2016 0100   KETONESUR NEGATIVE 06/16/2016 0100   PROTEINUR NEGATIVE 06/16/2016 0100   UROBILINOGEN 0.2 09/10/2012 0106   NITRITE NEGATIVE 06/16/2016 0100   LEUKOCYTESUR NEGATIVE 06/16/2016 0100    Radiological Exams on Admission: Dg Chest Portable 1 View  Result Date: 02/11/2017 CLINICAL DATA:  Dyspnea EXAM: PORTABLE CHEST 1 VIEW COMPARISON:  Chest radiograph 07/28/2016 FINDINGS: Cardiac silhouette is enlarged. Prominent interstitial markings are unchanged. No pneumothorax or pleural effusion. No focal consolidation. IMPRESSION: Unchanged cardiomegaly without focal airspace disease. Electronically Signed   By: Ulyses Jarred M.D.   On: 02/11/2017 22:48   Echocardiogram 01/16/2015 ------------------------------------------------------------------- LV EF: 60% -   65%  ------------------------------------------------------------------- Indications:   Dyspnea 786.09.  ------------------------------------------------------------------- History:  PMH: GERD. Anemia. Sarcoidosis. Dyspnea. Congestive heart failure. Congestive heart failure. Chronic obstructive pulmonary disease. Risk factors: Hypertension.  ------------------------------------------------------------------- Study Conclusions  - Left ventricle: The cavity size was normal. Wall thickness was increased in a pattern of mild LVH. Systolic function was normal. The estimated ejection fraction was in the range of 60% to 65%.  EKG: Independently reviewed.  Vent. rate 86 BPM PR interval * ms QRS duration 94 ms QT/QTc 381/456 ms P-R-T axes 68 90 1 Sinus rhythm  Borderline right axis deviation Borderline repolarization abnormality  Assessment/Plan Principal Problem:   COPD exacerbation (HCC) Admit to telemetry/observation. Continue supplemental oxygen. Continue scheduled and as needed bronchodilators. Methylprednisolone 80 mg IVP every 6 hours. Magnesium sulfate 4 g IVPB  Active Problems:   Sarcoidosis Hold prednisone while on IV methylprednisolone in the hospital. Continue weekly methotrexate injections at home. Continue bronchodilators as needed. Continue Kenalog cream for skin manifestations.    OSA (obstructive sleep apnea) Continue CPAP at bedtime.    Hypertension   Hyperglycemia Carbohydrate modified diet. CBG monitoring with regular insulin sliding scale in the hospital.    Hypothyroidism Continue levothyroxine 100 g by mouth daily. Monitor TSH as an outpatient.    Chronic diastolic CHF (congestive heart failure) (HCC) BNP was within normal limits. Continue furosemide 60 mg by mouth daily Continue losartan 50 mg by mouth daily. Continue metoprolol ER 25 mg by mouth daily. Monitor intake and output.    GERD (gastroesophageal reflux  disease) Famotidine 40 mg by mouth twice a day.    DVT prophylaxis: Lovenox SQ. Code Status: Full code. Family Communication:  Disposition Plan: Admit for COPD exacerbation treatment and blood pressure control. Consults called:  Admission status: Observation/telemetry.   Reubin Milan MD [facility] Pager [phone].  If 7PM-7AM, please contact night-coverage [email or web address] Password [email or web address]  02/12/2017, 1:22 AM

## 2017-02-13 DIAGNOSIS — D869 Sarcoidosis, unspecified: Secondary | ICD-10-CM

## 2017-02-13 DIAGNOSIS — I5033 Acute on chronic diastolic (congestive) heart failure: Secondary | ICD-10-CM

## 2017-02-13 DIAGNOSIS — E038 Other specified hypothyroidism: Secondary | ICD-10-CM

## 2017-02-13 DIAGNOSIS — R739 Hyperglycemia, unspecified: Secondary | ICD-10-CM

## 2017-02-13 DIAGNOSIS — G4733 Obstructive sleep apnea (adult) (pediatric): Secondary | ICD-10-CM

## 2017-02-13 DIAGNOSIS — I10 Essential (primary) hypertension: Secondary | ICD-10-CM

## 2017-02-13 LAB — CBC
HCT: 36.3 % (ref 36.0–46.0)
Hemoglobin: 11.7 g/dL — ABNORMAL LOW (ref 12.0–15.0)
MCH: 26.9 pg (ref 26.0–34.0)
MCHC: 32.2 g/dL (ref 30.0–36.0)
MCV: 83.4 fL (ref 78.0–100.0)
PLATELETS: 216 10*3/uL (ref 150–400)
RBC: 4.35 MIL/uL (ref 3.87–5.11)
RDW: 18.8 % — AB (ref 11.5–15.5)
WBC: 10.4 10*3/uL (ref 4.0–10.5)

## 2017-02-13 LAB — RENAL FUNCTION PANEL
ALBUMIN: 3.9 g/dL (ref 3.5–5.0)
Anion gap: 10 (ref 5–15)
BUN: 18 mg/dL (ref 6–20)
CO2: 32 mmol/L (ref 22–32)
CREATININE: 0.88 mg/dL (ref 0.44–1.00)
Calcium: 8.8 mg/dL — ABNORMAL LOW (ref 8.9–10.3)
Chloride: 96 mmol/L — ABNORMAL LOW (ref 101–111)
GFR calc Af Amer: >60 mL/min (ref >60)
Glucose, Bld: 176 mg/dL — ABNORMAL HIGH (ref 65–99)
PHOSPHORUS: 3.8 mg/dL (ref 2.5–4.6)
Potassium: 4.2 mmol/L (ref 3.5–5.1)
Sodium: 138 mmol/L (ref 135–145)

## 2017-02-13 LAB — GLUCOSE, CAPILLARY
GLUCOSE-CAPILLARY: 207 mg/dL — AB (ref 65–99)
GLUCOSE-CAPILLARY: 194 mg/dL — AB (ref 65–99)
Glucose-Capillary: 148 mg/dL — ABNORMAL HIGH (ref 65–99)
Glucose-Capillary: 200 mg/dL — ABNORMAL HIGH (ref 65–99)

## 2017-02-13 NOTE — Progress Notes (Signed)
Nutrition Brief Note  RD consulted via COPD gold protocol.  Pt with stable weight, ranging from 481-493 lb since August 2017. Pt currently eating 100% of meals. Last night consumed 100% of pot roast, dressing, sweet potato casserole and a roll (563 kcal, 27g protein).   Wt Readings from Last 15 Encounters:  02/13/17 (!) 493 lb 1.6 oz (223.7 kg)  01/02/17 (!) 494 lb 6.4 oz (224.3 kg)  12/26/16 (!) 489 lb (221.8 kg)  09/07/16 (!) 490 lb 1.6 oz (222.3 kg)  08/25/16 (!) 495 lb (224.5 kg)  08/09/16 (!) 485 lb (220 kg)  07/28/16 (!) 485 lb (220 kg)  07/27/16 (!) 498 lb (225.9 kg)  06/23/16 (!) 481 lb 14.4 oz (218.6 kg)  06/22/16 (!) 501 lb (227.3 kg)  06/21/16 (!) 501 lb 6.4 oz (227.4 kg)  06/19/16 (!) 495 lb 8 oz (224.8 kg)  02/28/16 (!) 469 lb 9.3 oz (213 kg)  02/24/16 (!) 493 lb 11.2 oz (223.9 kg)  02/08/16 (!) 497 lb (225.4 kg)    Body mass index is 79.59 kg/m. Patient meets criteria for morbid obesity based on current BMI.   Current diet order is HH/CHO modified, patient is consuming approximately 100% of meals at this time. Labs and medications reviewed.   No nutrition interventions warranted at this time. If nutrition issues arise, please consult RD.   Clayton Bibles, MS, RD, LDN Pager: [phone] After Hours Pager: [phone]

## 2017-02-13 NOTE — Progress Notes (Addendum)
PROGRESS NOTE  [name]  ONG:[id number] DOB: 1966/05/02 DOA: 02/11/2017 PCP: [name], NP Outpatient Specialists:  Subjective: Feels better, still has orthopnea and shortness of breath. Continue current diuretics. Ambulate in hallway.  Brief Narrative:  [name] is a 51 y.o. female with medical history significant of anemia, osteoarthritis, diastolic CHF, COPD on home oxygen, sarcoidosis, GERD/hiatal hernia, cholelithiasis, hypertension, hypothyroidism, urolithiasis, morbid obesity history of pneumonia in 2012, sleep apnea on CPAP is coming to the emergency department with a history of 2-3 days of progressively worse shortness of breath and wheezing. She complains of orthopnea and lower extremity edema. She denies productive cough, fever, chills, chest pain, palpitations, dizziness, diaphoresis, abdominal pain, nausea, emesis, diarrhea, constipation, melena, dysuria, hematuria or frequency.  Assessment & Plan:   Principal Problem:   COPD exacerbation ([place]) Active Problems:   OSA (obstructive sleep apnea)   Hypertension   Sarcoidosis   Hyperglycemia   Hypothyroidism   Acute on chronic diastolic CHF (congestive heart failure) (HCC)   GERD (gastroesophageal reflux disease)    Acute COPD exacerbation ([place]) Admitted to telemetry, continued to have wheezing, SOB and cough. Started on Solu-Medrol, add antibiotics Continue supportive management with bronchodilators, mucolytics, antitussives and oxygen as needed.  Acute on chronic diastolic CHF (congestive heart failure) (HCC) Appears to have volume overload, clinically she is very orthopneic. Start IV furosemide, follow weight daily, follow intake/output. Hold valsartan and continue metoprolol. Had 4.6 L urine output yesterday, continue diuresis.  Sarcoidosis Hold prednisone while on IV methylprednisolone in the hospital. Continue weekly methotrexate injections at home. Continue Kenalog cream for skin  manifestations.  OSA (obstructive sleep apnea) Continue CPAP at bedtime.  Hypertension Hyperglycemia Carbohydrate modified diet. Likely steroids induced hyperglycemia, check CBGs. CBG monitoring with regular insulin sliding scale in the hospital.  Hypothyroidism Continue levothyroxine 100 g by mouth daily. Monitor TSH as an outpatient.  GERD (gastroesophageal reflux disease) Famotidine 40 mg by mouth twice a day.  Morbid obesity -BMI of 81.7  Hypomagnesemia -Magnesium of 1.3, replete with IV supplements  DVT prophylaxis: Lovenox Code Status: Full Code Family Communication:  Disposition Plan:  Diet: Diet heart healthy/carb modified Room service appropriate? Yes; Fluid consistency: Thin  Consultants:   None  Procedures:   None  Antimicrobials:    None  Objective: Vitals:   02/12/17 2311 02/13/17 0500 02/13/17 0703 02/13/17 0938  BP:  (!) 152/78    Pulse:  61    Resp: 20 20    Temp:  97.6 F (36.4 C)    TempSrc:  Oral    SpO2:  100%  98%  Weight:   (!) 223.7 kg (493 lb 1.6 oz)   Height:        Intake/Output Summary (Last 24 hours) at 02/13/17 1218 Last data filed at 02/13/17 0700  Gross per 24 hour  Intake             1140 ml  Output             3750 ml  Net            -2610 ml   Filed Weights   02/12/17 0050 02/12/17 0205 02/13/17 0703  Weight: (!) 228.7 kg (504 lb 1.6 oz) (!) 228.6 kg (503 lb 15.5 oz) (!) 223.7 kg (493 lb 1.6 oz)    Examination: General exam: Appears calm and comfortable  Respiratory system: Clear to auscultation. Respiratory effort normal. Cardiovascular system: S1 & S2 heard, RRR. No JVD, murmurs, rubs, gallops or clicks. No  pedal edema. Gastrointestinal system: Abdomen is nondistended, soft and nontender. No organomegaly or masses felt. Normal bowel sounds heard. Central nervous system: Alert and oriented. No focal neurological deficits. Extremities: Symmetric 5 x 5 power. Skin: No rashes, lesions or  ulcers Psychiatry: Judgement and insight appear normal. Mood & affect appropriate.   Data Reviewed: I have personally reviewed following labs and imaging studies  CBC:  Recent Labs Lab 02/11/17 2249 02/12/17 0734 02/13/17 0508  WBC 6.2 8.7 10.4  NEUTROABS 5.2 8.4*  --   HGB 11.4* 12.6 11.7*  HCT 36.7 40.7 36.3  MCV 86.6 85.3 83.4  PLT 182 278 846   Basic Metabolic Panel:  Recent Labs Lab 02/11/17 2249 02/12/17 0130 02/12/17 0734 02/13/17 0508  NA 140  --  139 138  K 3.8  --  3.9 4.2  CL 102  --  98* 96*  CO2 31  --  30 32  GLUCOSE 108*  --  156* 176*  BUN 9  --  9 18  CREATININE 0.64  --  0.62 0.88  CALCIUM 8.9  --  9.1 8.8*  MG  --  1.3*  --   --   PHOS  --  3.3  --  3.8   GFR: Estimated Creatinine Clearance: 149.4 mL/min (by C-G formula based on SCr of 0.88 mg/dL). Liver Function Tests:  Recent Labs Lab 02/12/17 0734 02/13/17 0508  AST 25  --   ALT 22  --   ALKPHOS 38  --   BILITOT 0.8  --   PROT 8.0  --   ALBUMIN 4.0 3.9   No results for input(s): LIPASE, AMYLASE in the last 168 hours. No results for input(s): AMMONIA in the last 168 hours. Coagulation Profile: No results for input(s): INR, PROTIME in the last 168 hours. Cardiac Enzymes:  Recent Labs Lab 02/11/17 2249  TROPONINI <0.03   BNP (last 3 results) No results for input(s): PROBNP in the last 8760 hours. HbA1C: No results for input(s): HGBA1C in the last 72 hours. CBG:  Recent Labs Lab 02/12/17 1152 02/12/17 1643 02/12/17 2023 02/13/17 0738 02/13/17 1200  GLUCAP 181* 167* 227* 148* 207*   Lipid Profile: No results for input(s): CHOL, HDL, LDLCALC, TRIG, CHOLHDL, LDLDIRECT in the last 72 hours. Thyroid Function Tests: No results for input(s): TSH, T4TOTAL, FREET4, T3FREE, THYROIDAB in the last 72 hours. Anemia Panel: No results for input(s): VITAMINB12, FOLATE, FERRITIN, TIBC, IRON, RETICCTPCT in the last 72 hours. Urine analysis:    Component Value Date/Time    COLORURINE YELLOW 06/16/2016 0100   APPEARANCEUR CLOUDY (A) 06/16/2016 0100   LABSPEC 1.021 06/16/2016 0100   PHURINE 6.0 06/16/2016 0100   GLUCOSEU NEGATIVE 06/16/2016 0100   HGBUR LARGE (A) 06/16/2016 0100   BILIRUBINUR NEGATIVE 06/16/2016 0100   KETONESUR NEGATIVE 06/16/2016 0100   PROTEINUR NEGATIVE 06/16/2016 0100   UROBILINOGEN 0.2 09/10/2012 0106   NITRITE NEGATIVE 06/16/2016 0100   LEUKOCYTESUR NEGATIVE 06/16/2016 0100   Sepsis Labs: @LABRCNTIP (procalcitonin:4,lacticidven:4)  )No results found for this or any previous visit (from the past 240 hour(s)).   Invalid input(s): PROCALCITONIN, LACTICACIDVEN   Radiology Studies: Dg Chest Portable 1 View  Result Date: 02/11/2017 CLINICAL DATA:  Dyspnea EXAM: PORTABLE CHEST 1 VIEW COMPARISON:  Chest radiograph 07/28/2016 FINDINGS: Cardiac silhouette is enlarged. Prominent interstitial markings are unchanged. No pneumothorax or pleural effusion. No focal consolidation. IMPRESSION: Unchanged cardiomegaly without focal airspace disease. Electronically Signed   By: Ulyses Jarred M.D.   On: 02/11/2017 22:48  Scheduled Meds: . azithromycin  500 mg Intravenous Q24H  . cefTRIAXone (ROCEPHIN)  IV  1 g Intravenous Q24H  . enoxaparin (LOVENOX) injection  100 mg Subcutaneous Q24H  . famotidine  40 mg Oral BID  . ferrous sulfate  325 mg Oral TID WC  . folic acid  1 mg Oral Daily  . furosemide  80 mg Intravenous BID  . guaiFENesin  1,200 mg Oral BID  . insulin aspart  0-20 Units Subcutaneous TID WC  . ipratropium-albuterol  3 mL Nebulization TID  . levothyroxine  100 mcg Oral Q0600  . loratadine  10 mg Oral Daily  . methylPREDNISolone (SOLU-MEDROL) injection  80 mg Intravenous Q6H  . metoprolol succinate  25 mg Oral Daily  . potassium chloride  40 mEq Oral Daily  . sodium chloride flush  3 mL Intravenous Q12H  . cyanocobalamin  1,000 mcg Oral Daily   Continuous Infusions:   LOS: 1 day    Time spent: 35  minutes    Cameryn Schum A, MD [facility] Pager [phone]  If 7PM-7AM, please contact night-coverage [email or web address] Password [facility] 02/13/2017, 12:18 PM

## 2017-02-14 ENCOUNTER — Inpatient Hospital Stay (HOSPITAL_COMMUNITY): Payer: Medicare HMO

## 2017-02-14 ENCOUNTER — Telehealth: Payer: Self-pay | Admitting: Pulmonary Disease

## 2017-02-14 DIAGNOSIS — R0902 Hypoxemia: Secondary | ICD-10-CM

## 2017-02-14 LAB — RENAL FUNCTION PANEL
Albumin: 3.9 g/dL (ref 3.5–5.0)
Anion gap: 10 (ref 5–15)
BUN: 27 mg/dL — AB (ref 6–20)
CALCIUM: 8.7 mg/dL — AB (ref 8.9–10.3)
CHLORIDE: 96 mmol/L — AB (ref 101–111)
CO2: 32 mmol/L (ref 22–32)
CREATININE: 0.77 mg/dL (ref 0.44–1.00)
GFR calc Af Amer: >60 mL/min (ref >60)
Glucose, Bld: 176 mg/dL — ABNORMAL HIGH (ref 65–99)
Phosphorus: 3.8 mg/dL (ref 2.5–4.6)
Potassium: 4.1 mmol/L (ref 3.5–5.1)
SODIUM: 138 mmol/L (ref 135–145)

## 2017-02-14 LAB — MAGNESIUM: Magnesium: 1.8 mg/dL (ref 1.7–2.4)

## 2017-02-14 LAB — GLUCOSE, CAPILLARY
GLUCOSE-CAPILLARY: 181 mg/dL — AB (ref 65–99)
GLUCOSE-CAPILLARY: 166 mg/dL — AB (ref 65–99)
GLUCOSE-CAPILLARY: 240 mg/dL — AB (ref 65–99)
Glucose-Capillary: 139 mg/dL — ABNORMAL HIGH (ref 65–99)

## 2017-02-14 LAB — HIV ANTIBODY (ROUTINE TESTING W REFLEX): HIV Screen 4th Generation wRfx: NONREACTIVE

## 2017-02-14 MED ORDER — PERFLUTREN LIPID MICROSPHERE
1.0000 mL | INTRAVENOUS | Status: DC | PRN
  Filled 2017-02-14: qty 10

## 2017-02-14 NOTE — Telephone Encounter (Signed)
Dr. Elsworth Soho  Please Advise-  Pt is currently admitted in [facility]. They went ahead and did the CT scan while the pt is currently admitted. Pt wanted to know can she get her PFT while she is already in the hospital or did you want her to wait till closer to her follow up appointment which is in June

## 2017-02-14 NOTE — Telephone Encounter (Signed)
Called and spoke with pt and she is aware of RA recs.  Nothing further is needed.  

## 2017-02-14 NOTE — Telephone Encounter (Signed)
Prefer to wait until June -prefer to get PFTs when not sick

## 2017-02-14 NOTE — Progress Notes (Signed)
   02/14/17 1200  Clinical Encounter Type  Visited With Patient  Visit Type Other (Comment) (advance directive)  Advance Directives (For Healthcare)  Does Patient Have a Medical Advance Directive? Yes  Does patient want to make changes to medical advance directive? No - Patient declined  Type of Paramedic of [place];Living will  Copy of [place] in Chart? Yes  Copy of Living Will in Chart? Yes      Chaplain provided education and support.  Completed advance directive with pt.   Pt signed and notarized with two witnesses.   Copy of Advance Directive in chart and copy with medical records to be scanned into EPIC.  Pt with original and copies.     [facility] Chaplain Jerene Pitch, MDiv  24h page [phone]

## 2017-02-14 NOTE — Progress Notes (Signed)
PROGRESS NOTE  [name]  HDQ:[id number] DOB: 1966/08/28 DOA: 02/11/2017 PCP: [name], NP Outpatient Specialists:  Subjective: Complaining about feeling much worse since yesterday, oxygen saturation dropped to low 90s on 2 L. Feeling more short of breath than yesterday, asked to see her pulmonologist. Complaining about chest pain, increased with coughing, it is reproducible, likely secondary to cough.  Brief Narrative:  [name] is a 51 y.o. female with medical history significant of anemia, osteoarthritis, diastolic CHF, COPD on home oxygen, sarcoidosis, GERD/hiatal hernia, cholelithiasis, hypertension, hypothyroidism, urolithiasis, morbid obesity history of pneumonia in 2012, sleep apnea on CPAP is coming to the emergency department with a history of 2-3 days of progressively worse shortness of breath and wheezing. She complains of orthopnea and lower extremity edema. She denies productive cough, fever, chills, chest pain, palpitations, dizziness, diaphoresis, abdominal pain, nausea, emesis, diarrhea, constipation, melena, dysuria, hematuria or frequency.  Assessment & Plan:   Principal Problem:   COPD exacerbation ([place]) Active Problems:   OSA (obstructive sleep apnea)   Hypertension   Sarcoidosis   Hyperglycemia   Hypothyroidism   Acute on chronic diastolic CHF (congestive heart failure) (HCC)   GERD (gastroesophageal reflux disease)    Acute COPD exacerbation ([place]) Admitted to telemetry, continued to have wheezing, SOB and cough. Started on Solu-Medrol, add antibiotics Continue supportive management with bronchodilators, mucolytics, antitussives and oxygen as needed. Although she has less wheezing but she does not feel better.  Acute on chronic diastolic CHF (congestive heart failure) (HCC) Appears to have volume overload, clinically she is very orthopneic. Start IV furosemide, follow weight daily, follow intake/output. Hold valsartan and  continue metoprolol. 2.4 L of urine yesterday, total is 6.6, continue diuresis.  Sarcoidosis Hold prednisone while on IV methylprednisolone in the hospital. Continue weekly methotrexate injections at home. Continue Kenalog cream for skin manifestations.  OSA (obstructive sleep apnea) Continue CPAP at bedtime.  Hypertension Hyperglycemia Carbohydrate modified diet. Likely steroids induced hyperglycemia. CBG monitoring with regular insulin sliding scale in the hospital.  Hypothyroidism Continue levothyroxine 100 g by mouth daily. Monitor TSH as an outpatient.  GERD (gastroesophageal reflux disease) Famotidine 40 mg by mouth twice a day.  Morbid obesity -BMI of 81.7  Hypomagnesemia -Magnesium of 1.3, replaced with IV supplements.  DVT prophylaxis: Lovenox Code Status: Full Code Family Communication:  Disposition Plan:  Diet: Diet heart healthy/carb modified Room service appropriate? Yes; Fluid consistency: Thin  Consultants:   None  Procedures:   None  Antimicrobials:    None  Objective: Vitals:   02/13/17 1505 02/13/17 2157 02/14/17 0629 02/14/17 0827  BP:  (!) 159/71 (!) 151/77   Pulse:  71 72   Resp:  20 20   Temp:  98.3 F (36.8 C) 98.6 F (37 C)   TempSrc:  Oral Oral   SpO2: 95% 99% 98% 91%  Weight:   (!) 223.2 kg (492 lb)   Height:        Intake/Output Summary (Last 24 hours) at 02/14/17 1153 Last data filed at 02/14/17 0948  Gross per 24 hour  Intake              600 ml  Output             3300 ml  Net            -2700 ml   Filed Weights   02/12/17 0205 02/13/17 0703 02/14/17 0629  Weight: (!) 228.6 kg (503 lb 15.5 oz) (!) 223.7 kg (493 lb 1.6 oz) Marland Kitchen)  223.2 kg (492 lb)    Examination: General exam: Appears calm and comfortable  Respiratory system: Clear to auscultation. Respiratory effort normal. Cardiovascular system: S1 & S2 heard, RRR. No JVD, murmurs, rubs, gallops or clicks. No pedal edema. Gastrointestinal system: Abdomen  is nondistended, soft and nontender. No organomegaly or masses felt. Normal bowel sounds heard. Central nervous system: Alert and oriented. No focal neurological deficits. Extremities: Symmetric 5 x 5 power. Skin: No rashes, lesions or ulcers Psychiatry: Judgement and insight appear normal. Mood & affect appropriate.   Data Reviewed: I have personally reviewed following labs and imaging studies  CBC:  Recent Labs Lab 02/11/17 2249 02/12/17 0734 02/13/17 0508  WBC 6.2 8.7 10.4  NEUTROABS 5.2 8.4*  --   HGB 11.4* 12.6 11.7*  HCT 36.7 40.7 36.3  MCV 86.6 85.3 83.4  PLT 182 278 536   Basic Metabolic Panel:  Recent Labs Lab 02/11/17 2249 02/12/17 0130 02/12/17 0734 02/13/17 0508 02/14/17 0425  NA 140  --  139 138 138  K 3.8  --  3.9 4.2 4.1  CL 102  --  98* 96* 96*  CO2 31  --  30 32 32  GLUCOSE 108*  --  156* 176* 176*  BUN 9  --  9 18 27*  CREATININE 0.64  --  0.62 0.88 0.77  CALCIUM 8.9  --  9.1 8.8* 8.7*  MG  --  1.3*  --   --  1.8  PHOS  --  3.3  --  3.8 3.8   GFR: Estimated Creatinine Clearance: 164 mL/min (by C-G formula based on SCr of 0.77 mg/dL). Liver Function Tests:  Recent Labs Lab 02/12/17 0734 02/13/17 0508 02/14/17 0425  AST 25  --   --   ALT 22  --   --   ALKPHOS 38  --   --   BILITOT 0.8  --   --   PROT 8.0  --   --   ALBUMIN 4.0 3.9 3.9   No results for input(s): LIPASE, AMYLASE in the last 168 hours. No results for input(s): AMMONIA in the last 168 hours. Coagulation Profile: No results for input(s): INR, PROTIME in the last 168 hours. Cardiac Enzymes:  Recent Labs Lab 02/11/17 2249  TROPONINI <0.03   BNP (last 3 results) No results for input(s): PROBNP in the last 8760 hours. HbA1C: No results for input(s): HGBA1C in the last 72 hours. CBG:  Recent Labs Lab 02/13/17 0738 02/13/17 1200 02/13/17 1735 02/13/17 2155 02/14/17 0750  GLUCAP 148* 207* 194* 200* 139*   Lipid Profile: No results for input(s): CHOL, HDL,  LDLCALC, TRIG, CHOLHDL, LDLDIRECT in the last 72 hours. Thyroid Function Tests: No results for input(s): TSH, T4TOTAL, FREET4, T3FREE, THYROIDAB in the last 72 hours. Anemia Panel: No results for input(s): VITAMINB12, FOLATE, FERRITIN, TIBC, IRON, RETICCTPCT in the last 72 hours. Urine analysis:    Component Value Date/Time   COLORURINE YELLOW 06/16/2016 0100   APPEARANCEUR CLOUDY (A) 06/16/2016 0100   LABSPEC 1.021 06/16/2016 0100   PHURINE 6.0 06/16/2016 0100   GLUCOSEU NEGATIVE 06/16/2016 0100   HGBUR LARGE (A) 06/16/2016 0100   BILIRUBINUR NEGATIVE 06/16/2016 0100   KETONESUR NEGATIVE 06/16/2016 0100   PROTEINUR NEGATIVE 06/16/2016 0100   UROBILINOGEN 0.2 09/10/2012 0106   NITRITE NEGATIVE 06/16/2016 0100   LEUKOCYTESUR NEGATIVE 06/16/2016 0100   Sepsis Labs: @LABRCNTIP (procalcitonin:4,lacticidven:4)  )No results found for this or any previous visit (from the past 240 hour(s)).   Invalid input(s): PROCALCITONIN, LACTICACIDVEN  Radiology Studies: Ct Chest Wo Contrast  Result Date: 02/14/2017 CLINICAL DATA:  Hypoxia, history of sarcoidosis EXAM: CT CHEST WITHOUT CONTRAST TECHNIQUE: Multidetector CT imaging of the chest was performed following the standard protocol without IV contrast. COMPARISON:  CT chest 01/15/2015 FINDINGS: Cardiovascular: Cardiomegaly is noted. There are artifacts from patient's large body habitus. Mild atherosclerotic calcifications of thoracic aorta. No pericardial effusion. No aortic aneurysm. Mediastinum/Nodes: Central airways are patent. A precarinal lymph node measures 9 mm in short-axis not pathologic by size criteria. No hilar adenopathy is noted on this unenhanced scan. Visualized esophagus is unremarkable. Lungs/Pleura: Images of the lung parenchyma shows no infiltrate or pulmonary edema. No pleural thickening or pleural plaques. No focal consolidation. No pneumothorax. No bronchiectasis. No fibrotic changes. There is some linear atelectasis in right  upper lobe. Minimal linear atelectasis noted in left upper lobe posteriorly axial image 59. Upper Abdomen: The visualized upper abdomen shows no adrenal gland mass. The patient is status post cholecystectomy. Visualized unenhanced liver and spleen is unremarkable. Musculoskeletal: No destructive bony lesions are noted. Sagittal images of the spine shows degenerative changes thoracic spine. IMPRESSION: 1. No infiltrate or pulmonary edema. Bilateral upper lobe linear atelectasis right greater than left. There are artifacts from patient's large body habitus. 2. Mild atherosclerotic calcifications of thoracic aorta. Cardiomegaly is noted. No evidence of aortic aneurysm. 3. No mediastinal hematoma or adenopathy.  Patent central airway. 4. Mild degenerative changes thoracic spine. Electronically Signed   By: Lahoma Crocker M.D.   On: 02/14/2017 10:17        Scheduled Meds: . enoxaparin (LOVENOX) injection  100 mg Subcutaneous Q24H  . famotidine  40 mg Oral BID  . ferrous sulfate  325 mg Oral TID WC  . folic acid  1 mg Oral Daily  . furosemide  80 mg Intravenous BID  . guaiFENesin  1,200 mg Oral BID  . insulin aspart  0-20 Units Subcutaneous TID WC  . ipratropium-albuterol  3 mL Nebulization TID  . levothyroxine  100 mcg Oral Q0600  . loratadine  10 mg Oral Daily  . methylPREDNISolone (SOLU-MEDROL) injection  80 mg Intravenous Q6H  . metoprolol succinate  25 mg Oral Daily  . potassium chloride  40 mEq Oral Daily  . sodium chloride flush  3 mL Intravenous Q12H  . cyanocobalamin  1,000 mcg Oral Daily   Continuous Infusions: . azithromycin    . cefTRIAXone (ROCEPHIN)  IV       LOS: 2 days    Time spent: 35 minutes    Erla Bacchi A, MD [facility] Pager [phone]  If 7PM-7AM, please contact night-coverage [email or web address] Password [facility] 02/14/2017, 11:53 AM

## 2017-02-14 NOTE — Progress Notes (Signed)
RT placed patient on CPAP. Sterile water added to water chamber for humidification. Patient setting is auto 5-20 cmH2O. 2 liters oxygen bleed into tubing.Patient tolerating well.

## 2017-02-15 ENCOUNTER — Encounter (HOSPITAL_COMMUNITY): Payer: Self-pay

## 2017-02-15 ENCOUNTER — Inpatient Hospital Stay (HOSPITAL_COMMUNITY): Payer: Medicare HMO

## 2017-02-15 DIAGNOSIS — J9621 Acute and chronic respiratory failure with hypoxia: Secondary | ICD-10-CM

## 2017-02-15 DIAGNOSIS — I509 Heart failure, unspecified: Secondary | ICD-10-CM

## 2017-02-15 DIAGNOSIS — K219 Gastro-esophageal reflux disease without esophagitis: Secondary | ICD-10-CM

## 2017-02-15 LAB — RENAL FUNCTION PANEL
ALBUMIN: 3.9 g/dL (ref 3.5–5.0)
ANION GAP: 8 (ref 5–15)
BUN: 32 mg/dL — ABNORMAL HIGH (ref 6–20)
CHLORIDE: 97 mmol/L — AB (ref 101–111)
CO2: 36 mmol/L — ABNORMAL HIGH (ref 22–32)
Calcium: 8.6 mg/dL — ABNORMAL LOW (ref 8.9–10.3)
Creatinine, Ser: 0.8 mg/dL (ref 0.44–1.00)
GFR calc Af Amer: >60 mL/min (ref >60)
GLUCOSE: 175 mg/dL — AB (ref 65–99)
PHOSPHORUS: 3.9 mg/dL (ref 2.5–4.6)
POTASSIUM: 4.1 mmol/L (ref 3.5–5.1)
Sodium: 141 mmol/L (ref 135–145)

## 2017-02-15 LAB — GLUCOSE, CAPILLARY
GLUCOSE-CAPILLARY: 162 mg/dL — AB (ref 65–99)
GLUCOSE-CAPILLARY: 205 mg/dL — AB (ref 65–99)
GLUCOSE-CAPILLARY: 227 mg/dL — AB (ref 65–99)
Glucose-Capillary: 178 mg/dL — ABNORMAL HIGH (ref 65–99)

## 2017-02-15 LAB — ECHOCARDIOGRAM COMPLETE
Height: 66 in
WEIGHTICAEL: 7814.4 [oz_av]

## 2017-02-15 MED ORDER — IPRATROPIUM-ALBUTEROL 0.5-2.5 (3) MG/3ML IN SOLN
3.0000 mL | Freq: Four times a day (QID) | RESPIRATORY_TRACT | Status: DC
Start: 2017-02-15 — End: 2017-02-16
  Administered 2017-02-15 – 2017-02-16 (×5): 3 mL via RESPIRATORY_TRACT
  Filled 2017-02-15 (×6): qty 3

## 2017-02-15 MED ORDER — DOXYCYCLINE HYCLATE 100 MG PO TABS
100.0000 mg | ORAL_TABLET | Freq: Two times a day (BID) | ORAL | Status: DC
  Administered 2017-02-15 – 2017-02-16 (×3): 100 mg via ORAL
  Filled 2017-02-15 (×3): qty 1

## 2017-02-15 MED ORDER — HYDRALAZINE HCL 20 MG/ML IJ SOLN: 10.0000 mg | Freq: Three times a day (TID) | INTRAMUSCULAR | Status: DC | PRN

## 2017-02-15 MED ORDER — PERFLUTREN LIPID MICROSPHERE
INTRAVENOUS | Status: AC
  Filled 2017-02-15: qty 10

## 2017-02-15 MED ORDER — PREDNISONE 20 MG PO TABS
60.0000 mg | ORAL_TABLET | Freq: Every day | ORAL | Status: DC
  Administered 2017-02-16: 60 mg via ORAL
  Filled 2017-02-15: qty 3

## 2017-02-15 MED ORDER — PERFLUTREN LIPID MICROSPHERE
1.0000 mL | INTRAVENOUS | Status: AC | PRN
  Administered 2017-02-15: 2 mL via INTRAVENOUS

## 2017-02-15 MED ORDER — BUDESONIDE 0.5 MG/2ML IN SUSP
0.5000 mg | Freq: Two times a day (BID) | RESPIRATORY_TRACT | Status: DC
Start: 2017-02-15 — End: 2017-02-16
  Administered 2017-02-15 – 2017-02-16 (×3): 0.5 mg via RESPIRATORY_TRACT
  Filled 2017-02-15 (×3): qty 2

## 2017-02-15 NOTE — Progress Notes (Signed)
  Echocardiogram 2D Echocardiogram with Definity has been performed.  Tresa Res 02/15/2017, 1:54 PM

## 2017-02-15 NOTE — Progress Notes (Signed)
PROGRESS NOTE  [name]  CWC:[id number] DOB: 01/03/1966 DOA: 02/11/2017 PCP: [name], NP   Outpatient Specialists: Dr. Elsworth Soho (pulmonologist)  Subjective: Overall feeling better; even SOB with minimal exertion and complaining of intermittent cough. using O2 supplementation, slightly higher than at baseline. Afebrile, denies CP, no nausea, no vomiting.  Brief Narrative:  [name] is a 51 y.o. female with medical history significant of anemia, osteoarthritis, diastolic CHF, COPD on home oxygen, sarcoidosis, GERD/hiatal hernia, cholelithiasis, hypertension, hypothyroidism, urolithiasis, morbid obesity history of pneumonia in 2012, sleep apnea on CPAP is coming to the emergency department with a history of 2-3 days of progressively worse shortness of breath and wheezing. She complains of orthopnea and lower extremity edema. She denies productive cough, fever, chills, chest pain, palpitations, dizziness, diaphoresis, abdominal pain, nausea, emesis, diarrhea, constipation, melena, dysuria, hematuria or frequency.  Assessment & Plan:   Principal Problem:   COPD exacerbation ([place]) Active Problems:   OSA (obstructive sleep apnea)   Hypertension   Sarcoidosis   Hyperglycemia   Hypothyroidism   Acute on chronic diastolic CHF (congestive heart failure) (HCC)   GERD (gastroesophageal reflux disease)   Acute on chronic resp failure: due to COPD exacerbation (HCC) and CHF exacerbation. -Continued to have wheezing, SOB and intermittent cough. Symptoms much improved. -minimal end exp wheezing now; good air movement -chronically on 2L of Oxygen supplementation -weaned to baseline -will start steroids tapering, added pulmicort, flutter valve and will continue duoneb -will follow clinical response  Acute on chronic diastolic CHF (congestive heart failure) (HCC) -continue low sodium diet -follow strict I's and O's -will continue IV lasix -follow daily weight -Echo  with preserved EF -still with fine crackles and positive JVD on exam.  Sarcoidosis -will initiate steroids tapering -resume methotrexate at discharge -continue kenalog   OSA (obstructive sleep apnea) -Will continue CPAP  Hypertension -BP rising -most likely with use of steroids -will use PRN hydralazine -continue lasix and metoprolol -resume losartan once acute CHF resolved  Hyperglycemia: no prior hx of diabetes -most likely induced by steroids -will monitor CBG's and continue SSI  Hypothyroidism -will continue synthroid  GERD (gastroesophageal reflux disease) -will continue famotidine BID -no nausea, vomiting or dyspepsia reported  Morbid obesity -Body mass index is 78.83 kg/m. -low calorie diet and increase physical activity discussed with patient   Hypomagnesemia -Mg level WNL after repletion -will monitor trend   DVT prophylaxis: Lovenox Code Status: Full Code Family Communication: no family at bedside Disposition Plan: home when medically stable; hopefully in the next 24-48 hours. Will adjust breathing treatment, start prednisone therapy and give another 24 hours of IV lasix. Diet: Diet heart healthy/carb modified Room service appropriate? Yes; Fluid consistency: Thin  Consultants:   None  Procedures:   2-D echo - Left ventricle: The cavity size was severely dilated. There was   moderate concentric hypertrophy. Systolic function was normal.   The estimated ejection fraction was in the range of 60% to 65%.   Wall motion was normal; there were no regional wall motion   abnormalities. Left ventricular diastolic function parameters   were normal. - Pulmonary arteries: PA peak pressure: 40 mm Hg (S).  Impressions: - The right ventricular systolic pressure was increased consistent   with mild pulmonary hypertension.  Antimicrobials:    None  Objective: Vitals:   02/15/17 1546 02/15/17 2050 02/15/17 2103 02/15/17 2104  BP:  (!) 193/80      Pulse:  75    Resp:  20    Temp:  98.2 F (36.8 C)    TempSrc:  Oral    SpO2: 99% 97% 95% 95%  Weight:      Height:        Intake/Output Summary (Last 24 hours) at 02/15/17 2245 Last data filed at 02/15/17 1900  Gross per 24 hour  Intake              720 ml  Output             3030 ml  Net            -2310 ml   Filed Weights   02/13/17 0703 02/14/17 0629 02/15/17 2841  Weight: (!) 223.7 kg (493 lb 1.6 oz) (!) 223.2 kg (492 lb) (!) 221.5 kg (488 lb 6.4 oz)    Examination: General exam: in no acute distress; denies CP and sitting up on bedside chair. Wearing Oxygen supplementation. feeling better overall, even still describing DOE and decrease capacity. Respiratory system: no using accessory muscles; positive end exp wheezing, fine bibasilar crackles. Cardiovascular system: no rubs, no gallops, positive JVD (even difficult to assess due to body habitus); no murmurs. Gastrointestinal system: obese, normal BS, no tenderness, no guarding  Central nervous system: AAOX3, no focal neurologic deficit appreciated. Extremities: with positive edema (2++ bilaterally); no cyanosis, normal range of motion  Skin: no petechiae, no rash or open ulcers Psychiatry: mood is appropriate. Normal judgement and insight.  Data Reviewed: I have personally reviewed following labs and imaging studies  CBC:  Recent Labs Lab 02/11/17 2249 02/12/17 0734 02/13/17 0508  WBC 6.2 8.7 10.4  NEUTROABS 5.2 8.4*  --   HGB 11.4* 12.6 11.7*  HCT 36.7 40.7 36.3  MCV 86.6 85.3 83.4  PLT 182 278 324   Basic Metabolic Panel:  Recent Labs Lab 02/11/17 2249 02/12/17 0130 02/12/17 0734 02/13/17 0508 02/14/17 0425 02/15/17 0602  NA 140  --  139 138 138 141  K 3.8  --  3.9 4.2 4.1 4.1  CL 102  --  98* 96* 96* 97*  CO2 31  --  30 32 32 36*  GLUCOSE 108*  --  156* 176* 176* 175*  BUN 9  --  9 18 27* 32*  CREATININE 0.64  --  0.62 0.88 0.77 0.80  CALCIUM 8.9  --  9.1 8.8* 8.7* 8.6*  MG  --  1.3*  --    --  1.8  --   PHOS  --  3.3  --  3.8 3.8 3.9   GFR: Estimated Creatinine Clearance: 163.1 mL/min (by C-G formula based on SCr of 0.8 mg/dL).   Liver Function Tests:  Recent Labs Lab 02/12/17 0734 02/13/17 0508 02/14/17 0425 02/15/17 0602  AST 25  --   --   --   ALT 22  --   --   --   ALKPHOS 38  --   --   --   BILITOT 0.8  --   --   --   PROT 8.0  --   --   --   ALBUMIN 4.0 3.9 3.9 3.9   Cardiac Enzymes:  Recent Labs Lab 02/11/17 2249  TROPONINI <0.03   CBG:  Recent Labs Lab 02/14/17 2049 02/15/17 0736 02/15/17 1258 02/15/17 1627 02/15/17 2047  GLUCAP 240* 162* 178* 205* 227*    Urine analysis:    Component Value Date/Time   COLORURINE YELLOW 06/16/2016 0100   APPEARANCEUR CLOUDY (A) 06/16/2016 0100   LABSPEC 1.021 06/16/2016 0100   PHURINE 6.0  06/16/2016 0100   GLUCOSEU NEGATIVE 06/16/2016 0100   HGBUR LARGE (A) 06/16/2016 0100   BILIRUBINUR NEGATIVE 06/16/2016 0100   KETONESUR NEGATIVE 06/16/2016 0100   PROTEINUR NEGATIVE 06/16/2016 0100   UROBILINOGEN 0.2 09/10/2012 0106   NITRITE NEGATIVE 06/16/2016 0100   LEUKOCYTESUR NEGATIVE 06/16/2016 0100    Radiology Studies: Ct Chest Wo Contrast  Result Date: 02/14/2017 CLINICAL DATA:  Hypoxia, history of sarcoidosis EXAM: CT CHEST WITHOUT CONTRAST TECHNIQUE: Multidetector CT imaging of the chest was performed following the standard protocol without IV contrast. COMPARISON:  CT chest 01/15/2015 FINDINGS: Cardiovascular: Cardiomegaly is noted. There are artifacts from patient's large body habitus. Mild atherosclerotic calcifications of thoracic aorta. No pericardial effusion. No aortic aneurysm. Mediastinum/Nodes: Central airways are patent. A precarinal lymph node measures 9 mm in short-axis not pathologic by size criteria. No hilar adenopathy is noted on this unenhanced scan. Visualized esophagus is unremarkable. Lungs/Pleura: Images of the lung parenchyma shows no infiltrate or pulmonary edema. No pleural  thickening or pleural plaques. No focal consolidation. No pneumothorax. No bronchiectasis. No fibrotic changes. There is some linear atelectasis in right upper lobe. Minimal linear atelectasis noted in left upper lobe posteriorly axial image 59. Upper Abdomen: The visualized upper abdomen shows no adrenal gland mass. The patient is status post cholecystectomy. Visualized unenhanced liver and spleen is unremarkable. Musculoskeletal: No destructive bony lesions are noted. Sagittal images of the spine shows degenerative changes thoracic spine. IMPRESSION: 1. No infiltrate or pulmonary edema. Bilateral upper lobe linear atelectasis right greater than left. There are artifacts from patient's large body habitus. 2. Mild atherosclerotic calcifications of thoracic aorta. Cardiomegaly is noted. No evidence of aortic aneurysm. 3. No mediastinal hematoma or adenopathy.  Patent central airway. 4. Mild degenerative changes thoracic spine. Electronically Signed   By: Lahoma Crocker M.D.   On: 02/14/2017 10:17   Scheduled Meds: . budesonide (PULMICORT) nebulizer solution  0.5 mg Nebulization BID  . doxycycline  100 mg Oral Q12H  . enoxaparin (LOVENOX) injection  100 mg Subcutaneous Q24H  . famotidine  40 mg Oral BID  . ferrous sulfate  325 mg Oral TID WC  . folic acid  1 mg Oral Daily  . furosemide  80 mg Intravenous BID  . guaiFENesin  1,200 mg Oral BID  . insulin aspart  0-20 Units Subcutaneous TID WC  . ipratropium-albuterol  3 mL Nebulization QID  . levothyroxine  100 mcg Oral Q0600  . loratadine  10 mg Oral Daily  . metoprolol succinate  25 mg Oral Daily  . potassium chloride  40 mEq Oral Daily  . [START ON 02/16/2017] predniSONE  60 mg Oral Q breakfast  . sodium chloride flush  3 mL Intravenous Q12H  . cyanocobalamin  1,000 mcg Oral Daily   Continuous Infusions:    LOS: 3 days    Time spent: 25 minutes    Barton Dubois, MD [facility] Pager [phone]  If 7PM-7AM, please contact  night-coverage [email or web address] Password [email or web address] 02/15/2017, 10:45 PM

## 2017-02-16 LAB — RENAL FUNCTION PANEL
ANION GAP: 11 (ref 5–15)
Albumin: 3.7 g/dL (ref 3.5–5.0)
BUN: 31 mg/dL — AB (ref 6–20)
CO2: 33 mmol/L — ABNORMAL HIGH (ref 22–32)
Calcium: 8.3 mg/dL — ABNORMAL LOW (ref 8.9–10.3)
Chloride: 96 mmol/L — ABNORMAL LOW (ref 101–111)
Creatinine, Ser: 0.77 mg/dL (ref 0.44–1.00)
Glucose, Bld: 188 mg/dL — ABNORMAL HIGH (ref 65–99)
PHOSPHORUS: 3.3 mg/dL (ref 2.5–4.6)
POTASSIUM: 4.3 mmol/L (ref 3.5–5.1)
Sodium: 140 mmol/L (ref 135–145)

## 2017-02-16 LAB — GLUCOSE, CAPILLARY
GLUCOSE-CAPILLARY: 158 mg/dL — AB (ref 65–99)
GLUCOSE-CAPILLARY: 180 mg/dL — AB (ref 65–99)
Glucose-Capillary: 196 mg/dL — ABNORMAL HIGH (ref 65–99)

## 2017-02-16 LAB — BASIC METABOLIC PANEL
Anion gap: 10 (ref 5–15)
BUN: 32 mg/dL — AB (ref 6–20)
CHLORIDE: 96 mmol/L — AB (ref 101–111)
CO2: 34 mmol/L — ABNORMAL HIGH (ref 22–32)
CREATININE: 0.83 mg/dL (ref 0.44–1.00)
Calcium: 8.4 mg/dL — ABNORMAL LOW (ref 8.9–10.3)
GFR calc Af Amer: >60 mL/min (ref >60)
GLUCOSE: 191 mg/dL — AB (ref 65–99)
Potassium: 4.3 mmol/L (ref 3.5–5.1)
SODIUM: 140 mmol/L (ref 135–145)

## 2017-02-16 MED ORDER — POTASSIUM CHLORIDE CRYS ER 20 MEQ PO TBCR: 20.0000 meq | EXTENDED_RELEASE_TABLET | Freq: Every day | ORAL | 0 refills | Status: DC

## 2017-02-16 MED ORDER — FUROSEMIDE 20 MG PO TABS: 60.0000 mg | ORAL_TABLET | Freq: Two times a day (BID) | ORAL | 1 refills | Status: DC

## 2017-02-16 MED ORDER — DOXYCYCLINE HYCLATE 100 MG PO TABS: 100.0000 mg | ORAL_TABLET | Freq: Two times a day (BID) | ORAL | 0 refills | Status: DC

## 2017-02-16 MED ORDER — FAMOTIDINE 40 MG PO TABS: 40.0000 mg | ORAL_TABLET | Freq: Two times a day (BID) | ORAL | 0 refills | Status: DC

## 2017-02-16 MED ORDER — PREDNISONE 10 MG PO TABS: ORAL_TABLET | ORAL | 0 refills | Status: DC

## 2017-02-16 NOTE — Progress Notes (Signed)
Pt selected [facility] for HHPT.  Referral given to in house rep. 

## 2017-02-16 NOTE — Discharge Summary (Signed)
Physician Discharge Summary  [name] FMB:[id number] DOB: May 13, 1966 DOA: 02/11/2017  PCP: [name], NP  Admit date: 02/11/2017 Discharge date: 02/16/2017  Time spent: 35 minutes  Recommendations for Outpatient Follow-up:  1. Repeat BMET and Mg level to follow electrolytes and renal function  2. Follow Thyroid panel and adjust medications as needed  3. Reassess BP and adjust medications as needed    Discharge Diagnoses:  Principal Problem:   COPD exacerbation ([place]) Active Problems:   OSA (obstructive sleep apnea)   Hypertension   Sarcoidosis   Hyperglycemia   Hypothyroidism   Acute on chronic diastolic CHF (congestive heart failure) (HCC)   GERD (gastroesophageal reflux disease)   Discharge Condition: stable and improved. Discharge home with [facility] services and instructions to follow up with PCP in 10 days and with her pulmonologist as previously scheduled.   Diet recommendation: low calorie diet, low sodium diet   Filed Weights   02/14/17 0629 02/15/17 0638 02/16/17 0506  Weight: (!) 223.2 kg (492 lb) (!) 221.5 kg (488 lb 6.4 oz) (!) 220.5 kg (486 lb 1.8 oz)    History of present illness:  As per H&P written by Dr. Olevia Bowens 51 y.o. female with medical history significant of anemia, osteoarthritis, diastolic CHF, COPD on home oxygen, sarcoidosis, GERD/hiatal hernia, cholelithiasis, hypertension, hypothyroidism, urolithiasis, morbid obesity history of pneumonia in 2012, sleep apnea on CPAP is coming to the emergency department with a history of 2-3 days of progressively worse shortness of breath and wheezing. She complains of orthopnea and lower extremity edema. She denies productive cough, fever, chills, chest pain, palpitations, dizziness, diaphoresis, abdominal pain, nausea, emesis, diarrhea, constipation, melena, dysuria, hematuria or frequency.  ED Course: The patient received supplemental oxygen, bronchodilators and furosemide 80 mg IVP with oral potassium  supplementation in the emergency department reporting relief of her symptoms. EKG was sinus rhythm with borderline right axis deviation and repolarization abnormality. BNP and troponin were within normal limits. WBC 6.2, hemoglobin 11.4 g/dL and platelets 182. Her sodium 140, potassium 3.8, chloride 102, bicarbonate 31 mmol/L. BUN was 9, creatinine 0.64 and glucose 108 mg/dL. Her chest radiograph did not show any acute abnormalities. Please see full radiology report for further detail.  Hospital Course:  Acute on chronic resp failure: due to COPD exacerbation (HCC) and CHF exacerbation. -good air movement and no wheezing at discharge; good air movement -chronically on 2L of Oxygen supplementation and at baseline at discharge -will discharge on steroids tapering, continue flutter valve and will continue home duoneb -will need follow up with Pulmonary service for repeat PFT's  Acute on chronic diastolic CHF (congestive heart failure) ([place]) -continue low sodium diet -follow daily weight -will discharge on lasix (60mg  BID, dose adjusted) -Echo with preserved EF -no crackles on exam; still with LE swelling (but this is chronic, and most likely associated with her obesity)  Sarcoidosis -will discharge on steroids tapering -resume methotrexate at discharge (base on prior schedule medication usage) -continue kenalog   OSA (obstructive sleep apnea) and OHS -Will continue CPAP QHS -continue daily oxygen supplementaiton -advise about weight loss  Hypertension -BP slightly elevated -most likely with use of steroids -continue lasix and metoprolol -will resume losartan  Hyperglycemia: no prior hx of diabetes -most likely induced by steroids -advise to follow low carbohydrates diet -will need close follow up of CBG's as an outpatient.  Hypothyroidism -will continue synthroid -outpatient TSH follow up  GERD (gastroesophageal reflux disease) -will continue famotidine BID -no nausea,  vomiting or dyspepsia reported  Morbid  obesity -Body mass index is 78.83 kg/m. -low calorie diet and increase physical activity discussed with patient   Hypomagnesemia -Mg level WNL after repletion -will recommend outpatient blood work to monitor trend   Procedures:  2-D echo - Left ventricle: The cavity size was severely dilated. There was moderate concentric hypertrophy. Systolic function was normal. The estimated ejection fraction was in the range of 60% to 65%. Wall motion was normal; there were no regional wall motion abnormalities. Left ventricular diastolic function parameters were normal. - Pulmonary arteries: PA peak pressure: 40 mm Hg (S).  Impressions: - The right ventricular systolic pressure was increased consistent with mild pulmonary hypertension.  Consultations:  None   Discharge Exam: Vitals:   02/16/17 0506 02/16/17 1609  BP: (!) 150/72 (!) 153/68  Pulse: 62 65  Resp: 20 18  Temp: 97.7 F (36.5 C) 98.4 F (36.9 C)   General exam:morbidly obese, in no acute distress; denies CP and sitting up on bedside chair. Wearing Oxygen supplementation (2L, home chronic range). feeling much better overall, even still describing some DOE and decrease physical capacity. Respiratory system: no using accessory muscles; no wheezing, no crackles and good air movement. Cardiovascular system: no rubs, no gallops, no JVD; no murmurs. Gastrointestinal system: obese, normal BS, no tenderness, no guarding  Central nervous system: AAOX3, no focal neurologic deficit appreciated. Extremities: with positive edema (2++ bilaterally); no cyanosis, normal range of motion. Patient chronic swelling.  Skin: no petechiae, no rash or open ulcers Psychiatry: mood is appropriate. Normal judgement and insight.   Discharge Instructions   Discharge Instructions    (HEART FAILURE PATIENTS) Call MD:  Anytime you have any of the following symptoms: 1) 3 pound weight gain in  24 hours or 5 pounds in 1 week 2) shortness of breath, with or without a dry hacking cough 3) swelling in the hands, feet or stomach 4) if you have to sleep on extra pillows at night in order to breathe.    Complete by:  As directed    Diet - low sodium heart healthy    Complete by:  As directed    Discharge instructions    Complete by:  As directed    Follow low sodium diet (less than 2 gram daily) Take medications as prescribed  Please follow up with pulmonologist as scheduled  Please arrange follow up with PCP in 10 days Maintain adequate hydration and use CPAP every night     Current Discharge Medication List    START taking these medications   Details  doxycycline (VIBRA-TABS) 100 MG tablet Take 1 tablet (100 mg total) by mouth every 12 (twelve) hours. Qty: 12 tablet, Refills: 0    famotidine (PEPCID) 40 MG tablet Take 1 tablet (40 mg total) by mouth 2 (two) times daily. Qty: 60 tablet, Refills: 0    potassium chloride SA (K-DUR,KLOR-CON) 20 MEQ tablet Take 1 tablet (20 mEq total) by mouth daily. Qty: 30 tablet, Refills: 0      CONTINUE these medications which have CHANGED   Details  furosemide (LASIX) 20 MG tablet Take 3 tablets (60 mg total) by mouth 2 (two) times daily. Qty: 180 tablet, Refills: 1    predniSONE (DELTASONE) 10 MG tablet Take 6 tablets by mouth daily X 1 day; take 4 tablets daily X 2 days; take 2 tablets by mouth daily x 3 days; then resume 10 mg daily. Qty: 20 tablet, Refills: 0      CONTINUE these medications which have NOT CHANGED  Details  albuterol (PROVENTIL HFA;VENTOLIN HFA) 108 (90 Base) MCG/ACT inhaler Inhale 1-2 puffs into the lungs every 6 (six) hours as needed for wheezing. Qty: 1 Inhaler, Refills: 0    cetirizine (ZYRTEC) 10 MG tablet Take 10 mg by mouth at bedtime as needed for allergies.    cholecalciferol (VITAMIN D) 400 UNITS TABS tablet Take 400 Units by mouth daily.    Coral Calcium 1000 (390 CA) MG TABS Take 1,000 mg by mouth  daily.     ferrous sulfate 325 (65 FE) MG tablet Take 1 tablet (325 mg total) by mouth 3 (three) times daily with meals. Qty: 270 tablet, Refills: 11    folic acid (FOLVITE) 1 MG tablet Take 1 mg by mouth daily. Refills: 3    HYDROcodone-acetaminophen (NORCO) 10-325 MG tablet Take 1 tablet by mouth every 6 (six) hours as needed (pain). Qty: 30 tablet, Refills: 0    levothyroxine (SYNTHROID, LEVOTHROID) 100 MCG tablet TAKE 1 TABLET (100 MCG TOTAL) BY MOUTH DAILY AT 6 (SIX) AM. Qty: 90 tablet, Refills: 1    losartan (COZAAR) 50 MG tablet TAKE 1 TABLET (50 MG TOTAL) BY MOUTH DAILY. Qty: 30 tablet, Refills: 5    methotrexate 50 MG/2ML injection Inject 15 mg into the skin every Friday. Injects 0.38ml. Refills: 3    metoprolol succinate (TOPROL-XL) 25 MG 24 hr tablet TAKE 1 TABLET (25 MG TOTAL) BY MOUTH DAILY. Qty: 30 tablet, Refills: 5    triamcinolone acetonide (KENALOG) 40 MG/ML injection Inject 40 mg into the muscle every 3 (three) months. For knee pain    triamcinolone ointment (KENALOG) 0.1 % Apply 1 application topically See admin instructions. Applies twice a day as needed for break out of Sarcoidosis   Associated Diagnoses: Sarcoidosis    vitamin B-12 1000 MCG tablet Take 1 tablet (1,000 mcg total) by mouth daily. Qty: 30 tablet, Refills: 0    levonorgestrel (MIRENA) 20 MCG/24HR IUD 1 Intra Uterine Device (1 each total) by Intrauterine route once. Qty: 1 each, Refills: 0   Associated Diagnoses: Abnormal uterine bleeding; Morbid obesity, unspecified obesity type ([place])    NON FORMULARY 2 liter of oxygen      STOP taking these medications     ibuprofen (ADVIL,MOTRIN) 800 MG tablet        No Known Allergies Follow-up Information    [name], NP. Schedule an appointment as soon as possible for a visit in 10 day(s).   Specialty:  Family Medicine Contact information: [address] [place] [address] [phone]           The results of  significant diagnostics from this hospitalization (including imaging, microbiology, ancillary and laboratory) are listed below for reference.    Significant Diagnostic Studies: Ct Chest Wo Contrast  Result Date: 02/14/2017 CLINICAL DATA:  Hypoxia, history of sarcoidosis EXAM: CT CHEST WITHOUT CONTRAST TECHNIQUE: Multidetector CT imaging of the chest was performed following the standard protocol without IV contrast. COMPARISON:  CT chest 01/15/2015 FINDINGS: Cardiovascular: Cardiomegaly is noted. There are artifacts from patient's large body habitus. Mild atherosclerotic calcifications of thoracic aorta. No pericardial effusion. No aortic aneurysm. Mediastinum/Nodes: Central airways are patent. A precarinal lymph node measures 9 mm in short-axis not pathologic by size criteria. No hilar adenopathy is noted on this unenhanced scan. Visualized esophagus is unremarkable. Lungs/Pleura: Images of the lung parenchyma shows no infiltrate or pulmonary edema. No pleural thickening or pleural plaques. No focal consolidation. No pneumothorax. No bronchiectasis. No fibrotic changes. There is some linear atelectasis  in right upper lobe. Minimal linear atelectasis noted in left upper lobe posteriorly axial image 59. Upper Abdomen: The visualized upper abdomen shows no adrenal gland mass. The patient is status post cholecystectomy. Visualized unenhanced liver and spleen is unremarkable. Musculoskeletal: No destructive bony lesions are noted. Sagittal images of the spine shows degenerative changes thoracic spine. IMPRESSION: 1. No infiltrate or pulmonary edema. Bilateral upper lobe linear atelectasis right greater than left. There are artifacts from patient's large body habitus. 2. Mild atherosclerotic calcifications of thoracic aorta. Cardiomegaly is noted. No evidence of aortic aneurysm. 3. No mediastinal hematoma or adenopathy.  Patent central airway. 4. Mild degenerative changes thoracic spine. Electronically Signed   By:  Lahoma Crocker M.D.   On: 02/14/2017 10:17   Dg Chest Portable 1 View  Result Date: 02/11/2017 CLINICAL DATA:  Dyspnea EXAM: PORTABLE CHEST 1 VIEW COMPARISON:  Chest radiograph 07/28/2016 FINDINGS: Cardiac silhouette is enlarged. Prominent interstitial markings are unchanged. No pneumothorax or pleural effusion. No focal consolidation. IMPRESSION: Unchanged cardiomegaly without focal airspace disease. Electronically Signed   By: Ulyses Jarred M.D.   On: 02/11/2017 22:48   Labs: Basic Metabolic Panel:  Recent Labs Lab 02/12/17 0130 02/12/17 0734 02/13/17 0508 02/14/17 0425 02/15/17 0602 02/16/17 0511  NA  --  139 138 138 141 140  140  K  --  3.9 4.2 4.1 4.1 4.3  4.3  CL  --  98* 96* 96* 97* 96*  96*  CO2  --  30 32 32 36* 33*  34*  GLUCOSE  --  156* 176* 176* 175* 188*  191*  BUN  --  9 18 27* 32* 31*  32*  CREATININE  --  0.62 0.88 0.77 0.80 0.77  0.83  CALCIUM  --  9.1 8.8* 8.7* 8.6* 8.3*  8.4*  MG 1.3*  --   --  1.8  --   --   PHOS 3.3  --  3.8 3.8 3.9 3.3   Liver Function Tests:  Recent Labs Lab 02/12/17 0734 02/13/17 0508 02/14/17 0425 02/15/17 0602 02/16/17 0511  AST 25  --   --   --   --   ALT 22  --   --   --   --   ALKPHOS 38  --   --   --   --   BILITOT 0.8  --   --   --   --   PROT 8.0  --   --   --   --   ALBUMIN 4.0 3.9 3.9 3.9 3.7   CBC:  Recent Labs Lab 02/11/17 2249 02/12/17 0734 02/13/17 0508  WBC 6.2 8.7 10.4  NEUTROABS 5.2 8.4*  --   HGB 11.4* 12.6 11.7*  HCT 36.7 40.7 36.3  MCV 86.6 85.3 83.4  PLT 182 278 216   Cardiac Enzymes:  Recent Labs Lab 02/11/17 2249  TROPONINI <0.03   BNP: BNP (last 3 results)  Recent Labs  02/26/16 2205 06/16/16 0000 02/11/17 2249  BNP 42.9 62.0 16.9   CBG:  Recent Labs Lab 02/15/17 1258 02/15/17 1627 02/15/17 2047 02/16/17 0816 02/16/17 1310  GLUCAP 178* 205* 227* 158* 196*    Signed:  Barton Dubois MD.  [facility] 02/16/2017, 4:20 PM

## 2017-02-17 DIAGNOSIS — J189 Pneumonia, unspecified organism: Secondary | ICD-10-CM | POA: Diagnosis not present

## 2017-02-18 DIAGNOSIS — J189 Pneumonia, unspecified organism: Secondary | ICD-10-CM | POA: Diagnosis not present

## 2017-02-19 DIAGNOSIS — J189 Pneumonia, unspecified organism: Secondary | ICD-10-CM | POA: Diagnosis not present

## 2017-02-20 ENCOUNTER — Other Ambulatory Visit (HOSPITAL_BASED_OUTPATIENT_CLINIC_OR_DEPARTMENT_OTHER): Payer: Medicare HMO

## 2017-02-20 ENCOUNTER — Ambulatory Visit (INDEPENDENT_AMBULATORY_CARE_PROVIDER_SITE_OTHER): Payer: Medicare HMO | Admitting: Orthopaedic Surgery

## 2017-02-20 DIAGNOSIS — M25561 Pain in right knee: Secondary | ICD-10-CM

## 2017-02-20 DIAGNOSIS — G894 Chronic pain syndrome: Secondary | ICD-10-CM | POA: Diagnosis not present

## 2017-02-20 DIAGNOSIS — M1712 Unilateral primary osteoarthritis, left knee: Secondary | ICD-10-CM | POA: Diagnosis not present

## 2017-02-20 DIAGNOSIS — N920 Excessive and frequent menstruation with regular cycle: Secondary | ICD-10-CM

## 2017-02-20 DIAGNOSIS — M1711 Unilateral primary osteoarthritis, right knee: Secondary | ICD-10-CM | POA: Insufficient documentation

## 2017-02-20 DIAGNOSIS — Z79899 Other long term (current) drug therapy: Secondary | ICD-10-CM | POA: Diagnosis not present

## 2017-02-20 DIAGNOSIS — M064 Inflammatory polyarthropathy: Secondary | ICD-10-CM | POA: Diagnosis not present

## 2017-02-20 DIAGNOSIS — G8929 Other chronic pain: Secondary | ICD-10-CM

## 2017-02-20 DIAGNOSIS — J189 Pneumonia, unspecified organism: Secondary | ICD-10-CM | POA: Diagnosis not present

## 2017-02-20 DIAGNOSIS — Z6841 Body Mass Index (BMI) 40.0 and over, adult: Secondary | ICD-10-CM | POA: Diagnosis not present

## 2017-02-20 DIAGNOSIS — M25562 Pain in left knee: Secondary | ICD-10-CM | POA: Diagnosis not present

## 2017-02-20 DIAGNOSIS — M159 Polyosteoarthritis, unspecified: Secondary | ICD-10-CM | POA: Diagnosis not present

## 2017-02-20 DIAGNOSIS — D500 Iron deficiency anemia secondary to blood loss (chronic): Secondary | ICD-10-CM

## 2017-02-20 DIAGNOSIS — E662 Morbid (severe) obesity with alveolar hypoventilation: Secondary | ICD-10-CM | POA: Diagnosis not present

## 2017-02-20 DIAGNOSIS — D860 Sarcoidosis of lung: Secondary | ICD-10-CM | POA: Diagnosis not present

## 2017-02-20 DIAGNOSIS — R21 Rash and other nonspecific skin eruption: Secondary | ICD-10-CM | POA: Diagnosis not present

## 2017-02-20 LAB — CBC & DIFF AND RETIC
BASO%: 0.1 % (ref 0.0–2.0)
Basophils Absolute: 0 10*3/uL (ref 0.0–0.1)
EOS%: 1.1 % (ref 0.0–7.0)
Eosinophils Absolute: 0.2 10*3/uL (ref 0.0–0.5)
HCT: 41.6 % (ref 34.8–46.6)
HGB: 12.9 g/dL (ref 11.6–15.9)
IMMATURE RETIC FRACT: 5.8 % (ref 1.60–10.00)
LYMPH%: 4.6 % — AB (ref 14.0–49.7)
MCH: 26.5 pg (ref 25.1–34.0)
MCHC: 31 g/dL — AB (ref 31.5–36.0)
MCV: 85.4 fL (ref 79.5–101.0)
MONO#: 1.2 10*3/uL — AB (ref 0.1–0.9)
MONO%: 7.7 % (ref 0.0–14.0)
NEUT%: 86.5 % — AB (ref 38.4–76.8)
NEUTROS ABS: 13.9 10*3/uL — AB (ref 1.5–6.5)
PLATELETS: 228 10*3/uL (ref 145–400)
RBC: 4.87 10*6/uL (ref 3.70–5.45)
RDW: 19.4 % — ABNORMAL HIGH (ref 11.2–14.5)
Retic %: 1 % (ref 0.70–2.10)
Retic Ct Abs: 48.7 10*3/uL (ref 33.70–90.70)
WBC: 16 10*3/uL — AB (ref 3.9–10.3)
lymph#: 0.7 10*3/uL — ABNORMAL LOW (ref 0.9–3.3)

## 2017-02-20 LAB — FERRITIN: FERRITIN: 69 ng/mL (ref 9–269)

## 2017-02-20 LAB — IRON AND TIBC
%SAT: 15 % — ABNORMAL LOW (ref 21–57)
Iron: 43 ug/dL (ref 41–142)
TIBC: 280 ug/dL (ref 236–444)
UIBC: 237 ug/dL (ref 120–384)

## 2017-02-20 LAB — TECHNOLOGIST REVIEW

## 2017-02-20 MED ORDER — LIDOCAINE HCL 1 % IJ SOLN
3.0000 mL | INTRAMUSCULAR | Status: AC | PRN
  Administered 2017-02-20: 3 mL

## 2017-02-20 MED ORDER — METHYLPREDNISOLONE ACETATE 40 MG/ML IJ SUSP
40.0000 mg | INTRAMUSCULAR | Status: AC | PRN
  Administered 2017-02-20: 40 mg via INTRA_ARTICULAR

## 2017-02-20 NOTE — Progress Notes (Signed)
Office Visit Note   Patient: [name]           Date of Birth: 15-Feb-1966           MRN: [id number] Visit Date: 02/20/2017              Requested by: Kim Peng, NP [address], [place] [address] PCP: Kim Peng, NP   Assessment & Plan: Visit Diagnoses:  1. Unilateral primary osteoarthritis, left knee   2. Unilateral primary osteoarthritis, right knee   3. Chronic pain of left knee   4. Chronic pain of right knee     Plan: She tolerated the steroid injections in both knees well. We'll repeat these again in 3 months.  Follow-Up Instructions: Return in about 3 months (around 05/22/2017).   Orders:  Orders Placed This Encounter  Procedures  . Large Joint Injection/Arthrocentesis  . Large Joint Injection/Arthrocentesis   No orders of the defined types were placed in this encounter.     Procedures: Large Joint Inj Date/Time: 02/20/2017 8:48 PM Performed by: [name] Authorized by: [name]   Location:  Knee Site:  R knee Ultrasound Guidance: No   Fluoroscopic Guidance: No   Arthrogram: No   Medications:  3 mL lidocaine 1 %; 40 mg methylPREDNISolone acetate 40 MG/ML Large Joint Inj Date/Time: 02/20/2017 8:48 PM Performed by: [name] Authorized by: [name]   Location:  Knee Site:  L knee Ultrasound Guidance: No   Fluoroscopic Guidance: No   Arthrogram: No   Medications:  3 mL lidocaine 1 %; 40 mg methylPREDNISolone acetate 40 MG/ML     Clinical Data: No additional findings.   Subjective: No chief complaint on file. The patient is well-known to me. She is a morbidly obese 51 year old with diabetes and significant lung issues on chronic oxygen. She has severe osteoarthritis and pain in both her knees. The only treatment that I can offer her is steroid injections. She comes in about every 3 months for steroid injections. Her pain is daily. It is detrimental effect  direct is daily living, her quality of life, and her mobility. Injections help temporize things. Again she is not a candidate for surgery at all given her obesity combined with significant medical issues. She has been recently hospitalized for breathing issues and is only just recently been discharged. Her knee pain is slowly been worsening.  HPI  Review of Systems She does report chronic shortness of breath but denies any chest pain, fever, chills, nausea, vomiting.  Objective: Vital Signs: LMP 02/07/2017   Physical Exam She is alert and oriented 3 and in no acute distress today. Ortho Exam Examination of both knee show significant varus malalignment. There is patellofemoral crepitation and limited range of motion secondary to pain. There is a large soft tissue envelope around both knees due to her obesity. Specialty Comments:  No specialty comments available.  Imaging: No results found. I re-reviewed past x-rays of her knees show the severity of the arthritic changes in both knees.  PMFS History: Patient Active Problem List   Diagnosis Date Noted  . Unilateral primary osteoarthritis, left knee 02/20/2017  . Unilateral primary osteoarthritis, right knee 02/20/2017  . Chronic pain of right knee 02/20/2017  . COPD exacerbation ([place]) 02/12/2017  . GERD (gastroesophageal reflux disease) 02/12/2017  . Abnormal uterine bleeding 08/09/2016  . Diastolic dysfunction with acute on chronic heart failure ([place]) 06/17/2016  . Microcytic anemia 02/27/2016  . Abdominal pain 02/27/2016  .  Chronic diastolic CHF (congestive heart failure) ([place]) 02/27/2016  . Hyperlipidemia 02/24/2016  . Chronic respiratory failure ([place]) 09/21/2015  . Absolute anemia   . Anxiety 04/14/2015  . Acute on chronic diastolic CHF (congestive heart failure) ([place]) 01/18/2015  . Hypoxemia   . Congestive heart failure ([place])   . Symptomatic anemia 01/15/2015  . Hypothyroidism 01/15/2015  . Morbid obesity ([place])  01/15/2015  . Fibroids 12/09/2013  . Hyperglycemia 09/16/2013  . Left knee pain 09/09/2012  . Sarcoidosis 09/09/2012  . Iron deficiency anemia due to chronic blood loss 09/09/2012  . Hypertension 02/25/2011  . Obesity hypoventilation syndrome ([place]) 02/14/2011  . OSA (obstructive sleep apnea) 02/10/2011   Past Medical History:  Diagnosis Date  . Anemia   . Arthritis    hands, shoulders, no meds  . CHF (congestive heart failure) (HCC)    EF60-65%  . Chronic hyperventilation syndrome    w/ obesity tx with albuterol inhaler and oxygen 2L  . COPD (chronic obstructive pulmonary disease) ([place])    uses oxygen 2 L  . Gallstones   . GERD (gastroesophageal reflux disease)    diet controlled - no meds  . H/O hiatal hernia   . Hypertension   . Hypothyroidism   . Kidney stones   . Morbid obesity ([place])   . Pneumonia    hoispitalized in 08/2011  . Sarcoidosis   . Seasonal allergies   . Shortness of breath    with exertion   . Sleep apnea    uses CPAP machine     Family History  Problem Relation Age of Onset  . Diabetes Father   . Cancer Father 86    colon cancer   . Diabetes Brother   . Deep vein thrombosis Mother   . Aneurysm Sister     d/o brain aneurysm    Past Surgical History:  Procedure Laterality Date  . [place]   x 1  . CHOLECYSTECTOMY  2000  . DILATION AND CURETTAGE OF UTERUS N/A 08/09/2016   Procedure: DILATATION AND CURETTAGE;  Surgeon: Everitt Amber, MD;  Location: [facility];  Service: Gynecology;  Laterality: N/A;  . HYSTEROSCOPY W/D&C  12/27/2011   Procedure: DILATATION AND CURETTAGE /HYSTEROSCOPY;  Surgeon: Maeola Sarah. Landry Mellow, MD;  Location: [facility];  Service: Gynecology;;  . I and D of abcess  05/2011  . INTRAUTERINE DEVICE (IUD) INSERTION N/A 08/09/2016   Procedure: INTRAUTERINE DEVICE (IUD) INSERTION;  Surgeon: Everitt Amber, MD;  Location: [facility];  Service: Gynecology;  Laterality: N/A;  . LUNG BIOPSY    . uterine abletion     Social History    Occupational History  . unemployeed    Social History Main Topics  . Smoking status: Never Smoker  . Smokeless tobacco: Never Used  . Alcohol use Yes     Comment: occasionally  . Drug use: No  . Sexual activity: No

## 2017-02-21 ENCOUNTER — Telehealth: Payer: Self-pay | Admitting: *Deleted

## 2017-02-21 DIAGNOSIS — J189 Pneumonia, unspecified organism: Secondary | ICD-10-CM | POA: Diagnosis not present

## 2017-02-21 NOTE — Telephone Encounter (Signed)
Called pt & informed of good iron levels & no need for IV iron for now.  Informed on next appts.  Pt expressed understanding.

## 2017-02-21 NOTE — Telephone Encounter (Signed)
-----   Message from Truitt Merle, MD sent at 02/21/2017  8:59 AM EDT ----- Please call pt and let her know the lab results. Her anemia has resolved since last iv iron, iron level adequate, no need for more iv iron for now, lab and f/u in May.  Truitt Merle

## 2017-02-22 DIAGNOSIS — J189 Pneumonia, unspecified organism: Secondary | ICD-10-CM | POA: Diagnosis not present

## 2017-02-23 DIAGNOSIS — J189 Pneumonia, unspecified organism: Secondary | ICD-10-CM | POA: Diagnosis not present

## 2017-02-24 DIAGNOSIS — J189 Pneumonia, unspecified organism: Secondary | ICD-10-CM | POA: Diagnosis not present

## 2017-02-25 DIAGNOSIS — J189 Pneumonia, unspecified organism: Secondary | ICD-10-CM | POA: Diagnosis not present

## 2017-02-26 DIAGNOSIS — J189 Pneumonia, unspecified organism: Secondary | ICD-10-CM | POA: Diagnosis not present

## 2017-03-02 ENCOUNTER — Ambulatory Visit (INDEPENDENT_AMBULATORY_CARE_PROVIDER_SITE_OTHER): Payer: Medicare HMO | Admitting: Adult Health

## 2017-03-02 VITALS — BP 172/80 | Temp 98.6°F | Wt >= 6400 oz

## 2017-03-02 DIAGNOSIS — E785 Hyperlipidemia, unspecified: Secondary | ICD-10-CM | POA: Diagnosis not present

## 2017-03-02 DIAGNOSIS — Z0000 Encounter for general adult medical examination without abnormal findings: Secondary | ICD-10-CM

## 2017-03-02 DIAGNOSIS — E038 Other specified hypothyroidism: Secondary | ICD-10-CM | POA: Diagnosis not present

## 2017-03-02 DIAGNOSIS — I10 Essential (primary) hypertension: Secondary | ICD-10-CM | POA: Diagnosis not present

## 2017-03-02 DIAGNOSIS — E559 Vitamin D deficiency, unspecified: Secondary | ICD-10-CM

## 2017-03-02 DIAGNOSIS — R739 Hyperglycemia, unspecified: Secondary | ICD-10-CM | POA: Diagnosis not present

## 2017-03-02 LAB — CBC WITH DIFFERENTIAL/PLATELET
BASOS ABS: 0 10*3/uL (ref 0.0–0.1)
BASOS PCT: 0.5 % (ref 0.0–3.0)
EOS ABS: 0.1 10*3/uL (ref 0.0–0.7)
Eosinophils Relative: 1.2 % (ref 0.0–5.0)
HEMATOCRIT: 42 % (ref 36.0–46.0)
Hemoglobin: 13.4 g/dL (ref 12.0–15.0)
LYMPHS PCT: 9.9 % — AB (ref 12.0–46.0)
Lymphs Abs: 1 10*3/uL (ref 0.7–4.0)
MCHC: 31.9 g/dL (ref 30.0–36.0)
MCV: 85.2 fl (ref 78.0–100.0)
MONOS PCT: 6.9 % (ref 3.0–12.0)
Monocytes Absolute: 0.7 10*3/uL (ref 0.1–1.0)
NEUTROS ABS: 8 10*3/uL — AB (ref 1.4–7.7)
Neutrophils Relative %: 81.5 % — ABNORMAL HIGH (ref 43.0–77.0)
Platelets: 194 10*3/uL (ref 150.0–400.0)
RBC: 4.93 Mil/uL (ref 3.87–5.11)
RDW: 22.2 % — AB (ref 11.5–15.5)
WBC: 9.8 10*3/uL (ref 4.0–10.5)

## 2017-03-02 LAB — BASIC METABOLIC PANEL
BUN: 17 mg/dL (ref 6–23)
CO2: 33 meq/L — AB (ref 19–32)
Calcium: 9.2 mg/dL (ref 8.4–10.5)
Chloride: 102 mEq/L (ref 96–112)
Creatinine, Ser: 0.74 mg/dL (ref 0.40–1.20)
GFR: 106.23 mL/min (ref >60.00)
Glucose, Bld: 107 mg/dL — ABNORMAL HIGH (ref 70–99)
POTASSIUM: 4.2 meq/L (ref 3.5–5.1)
SODIUM: 141 meq/L (ref 135–145)

## 2017-03-02 LAB — HEPATIC FUNCTION PANEL
ALK PHOS: 45 U/L (ref 39–117)
ALT: 9 U/L (ref 0–35)
AST: 6 U/L (ref 0–37)
Albumin: 3.9 g/dL (ref 3.5–5.2)
BILIRUBIN DIRECT: 0.1 mg/dL (ref 0.0–0.3)
TOTAL PROTEIN: 6.9 g/dL (ref 6.0–8.3)
Total Bilirubin: 0.5 mg/dL (ref 0.2–1.2)

## 2017-03-02 LAB — MAGNESIUM: MAGNESIUM: 1.8 mg/dL (ref 1.5–2.5)

## 2017-03-02 LAB — HEMOGLOBIN A1C: Hgb A1c MFr Bld: 6 % (ref 4.6–6.5)

## 2017-03-02 LAB — LIPID PANEL
CHOL/HDL RATIO: 4
Cholesterol: 261 mg/dL — ABNORMAL HIGH (ref 0–200)
HDL: 63.2 mg/dL (ref >39.00)
LDL CALC: 172 mg/dL — AB (ref 0–99)
NonHDL: 197.61
Triglycerides: 130 mg/dL (ref 0.0–149.0)
VLDL: 26 mg/dL (ref 0.0–40.0)

## 2017-03-02 LAB — TSH: TSH: 7.01 u[IU]/mL — AB (ref 0.35–4.50)

## 2017-03-02 LAB — VITAMIN D 25 HYDROXY (VIT D DEFICIENCY, FRACTURES): VITD: 12.27 ng/mL — AB (ref 30.00–100.00)

## 2017-03-02 MED ORDER — METOPROLOL SUCCINATE ER 25 MG PO TB24: ORAL_TABLET | ORAL | 11 refills | Status: DC

## 2017-03-02 MED ORDER — LOSARTAN POTASSIUM 50 MG PO TABS: 50.0000 mg | ORAL_TABLET | Freq: Every day | ORAL | 11 refills | Status: DC

## 2017-03-02 NOTE — Progress Notes (Signed)
Subjective:    Patient ID: [name], female    DOB: [birth year], 51 y.o.   MRN: [id number]  HPI  Patient presents for yearly preventative medicine examination. She is a pleasant 51 year old female who  has a past medical history of Anemia; Arthritis; CHF (congestive heart failure) ([place]); Chronic hyperventilation syndrome; COPD (chronic obstructive pulmonary disease) (HCC); Gallstones; GERD (gastroesophageal reflux disease); H/O hiatal hernia; Hypertension; Hypothyroidism; Kidney stones; Morbid obesity ([place]); Pneumonia; Sarcoidosis; Seasonal allergies; Shortness of breath; and Sleep apnea.  All immunizations and health maintenance protocols were reviewed with the patient and needed orders were placed.  Appropriate screening laboratory values were ordered for the patient including screening of hyperlipidemia, renal function and hepatic function.  Medication reconciliation,  past medical history, social history, problem list and allergies were reviewed in detail with the patient  Goals were established with regard to weight loss, exercise, and  diet in compliance with medications.   Wt Readings from Last 3 Encounters:  03/02/17 (!) 480 lb 9.6 oz (218 kg)  02/16/17 (!) 486 lb 1.8 oz (220.5 kg)  01/02/17 (!) 494 lb 6.4 oz (224.3 kg)    End of life planning was discussed. She has an advanced directive and living will  Interval history includes a recent hospitalization on 02/16/2017 for hypoxia, 08/09/2016 for IUD placement ( that she reports has come out two months after being placed) and 05/2016 for symptomatic anemia. [name] reports that she feels " perfectly fine" since being discharged from the hospital this last time. She is not having to wear her supplemental oxygen during the day.   She also feels as though the IV iron infusions are helping her with fatigue.   She refuses colonoscopy despite knowing her risks. She cancelled her mammogram but plans on rescheduling  this.   She would like to get am electric scooter so that she can be outside more often. She finds it hard to walk more than 100 feet before she becomes short of breath. She sees Dr. Elsworth Soho for presumed Sarcoidosis   She takes Cozaar 50 mg and Metoprolol 25 XR mg daily for hypertension    Review of Systems  Constitutional: Negative.   HENT: Negative.   Eyes: Negative.   Respiratory: Positive for shortness of breath.   Cardiovascular: Negative.   Gastrointestinal: Negative.   Endocrine: Negative.   Genitourinary: Negative.   Musculoskeletal: Negative.   Skin: Negative.   Allergic/Immunologic: Negative.   Neurological: Negative.   Hematological: Negative.   Psychiatric/Behavioral: Negative.   All other systems reviewed and are negative.  Past Medical History:  Diagnosis Date  . Anemia   . Arthritis    hands, shoulders, no meds  . CHF (congestive heart failure) (HCC)    EF60-65%  . Chronic hyperventilation syndrome    w/ obesity tx with albuterol inhaler and oxygen 2L  . COPD (chronic obstructive pulmonary disease) ([place])    uses oxygen 2 L  . Gallstones   . GERD (gastroesophageal reflux disease)    diet controlled - no meds  . H/O hiatal hernia   . Hypertension   . Hypothyroidism   . Kidney stones   . Morbid obesity ([place])   . Pneumonia    hoispitalized in 08/2011  . Sarcoidosis   . Seasonal allergies   . Shortness of breath    with exertion   . Sleep apnea    uses CPAP machine     Social History   Social History  . Marital  status: Single    Spouse name: N/A  . Number of children: N/A  . Years of education: N/A   Occupational History  . unemployeed    Social History Main Topics  . Smoking status: Never Smoker  . Smokeless tobacco: Never Used  . Alcohol use Yes     Comment: occasionally  . Drug use: No  . Sexual activity: No   Other Topics Concern  . Not on file   Social History Narrative   Is not currently working.    Divorced for eight or nine  years   Has one son who lives around here.        Past Surgical History:  Procedure Laterality Date  . [place]   x 1  . CHOLECYSTECTOMY  2000  . DILATION AND CURETTAGE OF UTERUS N/A 08/09/2016   Procedure: DILATATION AND CURETTAGE;  Surgeon: Everitt Amber, MD;  Location: [facility];  Service: Gynecology;  Laterality: N/A;  . HYSTEROSCOPY W/D&C  12/27/2011   Procedure: DILATATION AND CURETTAGE /HYSTEROSCOPY;  Surgeon: Maeola Sarah. Landry Mellow, MD;  Location: [facility];  Service: Gynecology;;  . I and D of abcess  05/2011  . INTRAUTERINE DEVICE (IUD) INSERTION N/A 08/09/2016   Procedure: INTRAUTERINE DEVICE (IUD) INSERTION;  Surgeon: Everitt Amber, MD;  Location: [facility];  Service: Gynecology;  Laterality: N/A;  . LUNG BIOPSY    . uterine abletion      Family History  Problem Relation Age of Onset  . Diabetes Father   . Cancer Father 25    colon cancer   . Diabetes Brother   . Deep vein thrombosis Mother   . Aneurysm Sister     d/o brain aneurysm    No Known Allergies  Current Outpatient Prescriptions on File Prior to Visit  Medication Sig Dispense Refill  . albuterol (PROVENTIL HFA;VENTOLIN HFA) 108 (90 Base) MCG/ACT inhaler Inhale 1-2 puffs into the lungs every 6 (six) hours as needed for wheezing. 1 Inhaler 0  . cetirizine (ZYRTEC) 10 MG tablet Take 10 mg by mouth at bedtime as needed for allergies.    . cholecalciferol (VITAMIN D) 400 UNITS TABS tablet Take 400 Units by mouth daily.    Marland Kitchen Coral Calcium 1000 (390 CA) MG TABS Take 1,000 mg by mouth daily.     Marland Kitchen doxycycline (VIBRA-TABS) 100 MG tablet Take 1 tablet (100 mg total) by mouth every 12 (twelve) hours. 12 tablet 0  . famotidine (PEPCID) 40 MG tablet Take 1 tablet (40 mg total) by mouth 2 (two) times daily. 60 tablet 0  . ferrous sulfate 325 (65 FE) MG tablet Take 1 tablet (325 mg total) by mouth 3 (three) times daily with meals. 778 tablet 11  . folic acid (FOLVITE) 1 MG tablet Take 1 mg by mouth daily.  3  . furosemide (LASIX)  20 MG tablet Take 3 tablets (60 mg total) by mouth 2 (two) times daily. 180 tablet 1  . HYDROcodone-acetaminophen (NORCO) 10-325 MG tablet Take 1 tablet by mouth every 6 (six) hours as needed (pain). 30 tablet 0  . levonorgestrel (MIRENA) 20 MCG/24HR IUD 1 Intra Uterine Device (1 each total) by Intrauterine route once. 1 each 0  . levothyroxine (SYNTHROID, LEVOTHROID) 100 MCG tablet TAKE 1 TABLET (100 MCG TOTAL) BY MOUTH DAILY AT 6 (SIX) AM. 90 tablet 1  . losartan (COZAAR) 50 MG tablet TAKE 1 TABLET (50 MG TOTAL) BY MOUTH DAILY. 30 tablet 5  . methotrexate 50 MG/2ML injection Inject 15 mg  into the skin every Friday. Injects 0.85ml.  3  . metoprolol succinate (TOPROL-XL) 25 MG 24 hr tablet TAKE 1 TABLET (25 MG TOTAL) BY MOUTH DAILY. 30 tablet 5  . NON FORMULARY 2 liter of oxygen    . potassium chloride SA (K-DUR,KLOR-CON) 20 MEQ tablet Take 1 tablet (20 mEq total) by mouth daily. 30 tablet 0  . predniSONE (DELTASONE) 10 MG tablet Take 6 tablets by mouth daily X 1 day; take 4 tablets daily X 2 days; take 2 tablets by mouth daily x 3 days; then resume 10 mg daily. 20 tablet 0  . triamcinolone acetonide (KENALOG) 40 MG/ML injection Inject 40 mg into the muscle every 3 (three) months. For knee pain    . triamcinolone ointment (KENALOG) 0.1 % Apply 1 application topically See admin instructions. Applies twice a day as needed for break out of Sarcoidosis    . vitamin B-12 1000 MCG tablet Take 1 tablet (1,000 mcg total) by mouth daily. 30 tablet 0   No current facility-administered medications on file prior to visit.     BP (!) 172/80 (BP Location: Left Arm, Patient Position: Sitting, Cuff Size: Normal)   Temp 98.6 F (37 C) (Oral)   Wt (!) 480 lb 9.6 oz (218 kg)   LMP 02/07/2017   BMI 77.57 kg/m       Objective:   Physical Exam  Constitutional: She is oriented to person, place, and time. She appears well-developed and well-nourished. No distress.  Morbidly obese   HENT:  Head: Normocephalic  and atraumatic.  Right Ear: External ear normal.  Left Ear: External ear normal.  Nose: Nose normal.  Mouth/Throat: Oropharynx is clear and moist.  Eyes: Conjunctivae are normal. Pupils are equal, round, and reactive to light. Right eye exhibits no discharge. Left eye exhibits no discharge. No scleral icterus.  Neck: Normal range of motion. Neck supple. No thyromegaly present.  Cardiovascular: Normal rate, regular rhythm, normal heart sounds and intact distal pulses.  Exam reveals no gallop and no friction rub.   No murmur heard. Pulmonary/Chest: Effort normal and breath sounds normal. No respiratory distress. She has no wheezes. She has no rales. She exhibits no tenderness.  Abdominal: Soft. Bowel sounds are normal. She exhibits no distension and no mass. There is no tenderness. There is no rebound and no guarding.  Genitourinary:  Genitourinary Comments: Deferred  Lymphadenopathy:    She has no cervical adenopathy.  Neurological: She is alert and oriented to person, place, and time. She has normal reflexes. She displays normal reflexes. No cranial nerve deficit. Coordination normal.  Skin: Skin is warm and dry. No rash noted. She is not diaphoretic. No erythema. No pallor.  Psychiatric: She has a normal mood and affect. Her behavior is normal. Judgment and thought content normal.  Nursing note and vitals reviewed.     Assessment & Plan:  1. Routine general medical examination at a health care facility - Basic metabolic panel - CBC with Differential/Platelet - Hepatic function panel - Hemoglobin A1c - Lipid panel - TSH - Magnesium  2. Essential hypertension - Not controlled during today's visit. She reported not taking her medications before the appointment.  - I would like her to monitor her blood pressure at home and inform me of readings. Will titrate medication as needed - Basic metabolic panel - CBC with Differential/Platelet - Hepatic function panel - Hemoglobin A1c -  Lipid panel - TSH - Magnesium - losartan (COZAAR) 50 MG tablet; Take 1 tablet (50 mg  total) by mouth daily.  Dispense: 30 tablet; Refill: 11 - metoprolol succinate (TOPROL-XL) 25 MG 24 hr tablet; TAKE 1 TABLET (25 MG TOTAL) BY MOUTH DAILY.  Dispense: 30 tablet; Refill: 11  3. Other specified hypothyroidism  - Basic metabolic panel - CBC with Differential/Platelet - Hepatic function panel - Hemoglobin A1c - Lipid panel - TSH - Magnesium - Consider increasing Synthroid  4. Hyperlipidemia, unspecified hyperlipidemia type  - Basic metabolic panel - CBC with Differential/Platelet - Hepatic function panel - Hemoglobin A1c - Lipid panel - TSH - Magnesium  5. Hyperglycemia  - Basic metabolic panel - CBC with Differential/Platelet - Hepatic function panel - Hemoglobin A1c - Lipid panel - TSH - Magnesium  6. Vitamin D deficiency  - Vitamin D, 25-hydroxy  7. Morbid obesity ([place]) - Encouraged to lose weight   [name], NP

## 2017-03-17 NOTE — Progress Notes (Signed)
[facility]  [phone] [phone]) [phone]  Clinic Follow Up Note   Patient Care Team: [name], NP as PCP - General (Family Medicine) Wonda Horner, MD (Gastroenterology) Rigoberto Noel, MD as Consulting Physician (Pulmonary Disease) 03/20/2017  CHIEF COMPLAINTS:  Follow up anemia   HISTORY OF PRESENTING ILLNESS (06/23/2016):  [name] 51 y.o. female with past medical history of sarcoidosis, on chronic continuous oxygen 2 L/min, morbid obesity, congestive heart failure, here because of her anemia. She was referred by her PCP.   She has had anemia since she was in her 51's. She has very heavy menstrual period, the last about 7 days, she has to change a pad 5-6 times a day, and she contributes her anemia to her heavy period. She underwent endometrial ablation once in the past, which did not work. She would like to have a hysterectomy, which has not been offered by her gynecologist. She has been taking iron pill one or twice a day, but not very compliant. Her anemia has been getting worse lately, she was admitted to hospital twice for severe anemia in the past year, and received blood transfusion and IV feraheme in 12/2014 and 06/17/2016. She has been following with her PCP for her anemia.   She denies recent chest pain on exertion, shortness of breath on minimal exertion, pre-syncopal episodes, or palpitations. She had not noticed any other recent bleeding such as epistaxis, hematuria or hematochezia The patient denies over the counter NSAID ingestion. She is not on antiplatelets agents. She had no prior history or diagnosis of cancer. Her age appropriate screening programs are up-to-date. She denies any pica and eats a variety of diet. She never donated blood or received blood transfusion  CURRENT THERAPY: iv Feraheme 510mg  as needed, received on 01/16/2015, 06/17/2016, 07/01/2016  INTERIM HISTORY:  [name] returns for follow up.  She presents to the clinic today reporting things are well. She was in the hospital last month for breathing issue. She has oxygen at home and she has to sleep with it every night and during the day as needed. Her IUD came out in October and put in August and her period is much lighter. It last 7 days once a month.     MEDICAL HISTORY:  Past Medical History:  Diagnosis Date  . Anemia   . Arthritis    hands, shoulders, no meds  . CHF (congestive heart failure) (H[facility])    EF60-65%  . Chronic hyperventilation syndrome    w/ obesity tx with albuterol inhaler and oxygen 2L  . COPD (chronic obstructive pulmonary disease) ([place])    uses oxygen 2 L  . Gallstones   . GERD (gastroesophageal reflux disease)    diet controlled - no meds  . H/O hiatal hernia   . Hypertension   . Hypothyroidism   . Kidney stones   . Morbid obesity ([place])   . Pneumonia    hoispitalized in 08/2011  . Sarcoidosis   . Seasonal allergies   . Shortness of breath    with exertion   . Sleep apnea    uses CPAP machine     SURGICAL HISTORY: Past Surgical History:  Procedure Laterality Date  . [place]   x 1  . CHOLECYSTECTOMY  2000  . DILATION AND CURETTAGE OF UTERUS N/A 08/09/2016   Procedure: DILATATION AND CURETTAGE;  Surgeon: Everitt Amber, MD;  Location: [facility];  Service: Gynecology;  Laterality: N/A;  . HYSTEROSCOPY W/D&C  12/27/2011   Procedure: DILATATION AND CURETTAGE /HYSTEROSCOPY;  Surgeon: Maeola Sarah. Landry Mellow, MD;  Location: [facility];  Service: Gynecology;;  . I and D of abcess  05/2011  . INTRAUTERINE DEVICE (IUD) INSERTION N/A 08/09/2016   Procedure: INTRAUTERINE DEVICE (IUD) INSERTION;  Surgeon: Everitt Amber, MD;  Location: [facility];  Service: Gynecology;  Laterality: N/A;  . LUNG BIOPSY    . uterine abletion      SOCIAL HISTORY: Social History   Social History  . Marital status: Single    Spouse name: N/A  . Number of children: N/A  . Years of education: N/A   Occupational History  .  unemployeed    Social History Main Topics  . Smoking status: Never Smoker  . Smokeless tobacco: Never Used  . Alcohol use Yes     Comment: occasionally  . Drug use: No  . Sexual activity: No   Other Topics Concern  . Not on file   Social History Narrative   Is not currently working.    Divorced for eight or nine years   Has one son who lives around here.        FAMILY HISTORY: Family History  Problem Relation Age of Onset  . Diabetes Father   . Cancer Father 61       colon cancer   . Diabetes Brother   . Deep vein thrombosis Mother   . Aneurysm Sister        d/o brain aneurysm    ALLERGIES:  has No Known Allergies.  MEDICATIONS:  Current Outpatient Prescriptions  Medication Sig Dispense Refill  . albuterol (PROVENTIL HFA;VENTOLIN HFA) 108 (90 Base) MCG/ACT inhaler Inhale 1-2 puffs into the lungs every 6 (six) hours as needed for wheezing. 1 Inhaler 0  . cetirizine (ZYRTEC) 10 MG tablet Take 10 mg by mouth at bedtime as needed for allergies.    . cholecalciferol (VITAMIN D) 400 UNITS TABS tablet Take 400 Units by mouth daily.    Marland Kitchen Coral Calcium 1000 (390 CA) MG TABS Take 1,000 mg by mouth daily.     Marland Kitchen doxycycline (VIBRA-TABS) 100 MG tablet Take 1 tablet (100 mg total) by mouth every 12 (twelve) hours. 12 tablet 0  . famotidine (PEPCID) 40 MG tablet Take 1 tablet (40 mg total) by mouth 2 (two) times daily. 60 tablet 0  . ferrous sulfate 325 (65 FE) MG tablet Take 1 tablet (325 mg total) by mouth 3 (three) times daily with meals. 341 tablet 11  . folic acid (FOLVITE) 1 MG tablet Take 1 mg by mouth daily.  3  . furosemide (LASIX) 20 MG tablet Take 3 tablets (60 mg total) by mouth 2 (two) times daily. 180 tablet 1  . HYDROcodone-acetaminophen (NORCO) 10-325 MG tablet Take 1 tablet by mouth every 6 (six) hours as needed (pain). 30 tablet 0  . levothyroxine (SYNTHROID, LEVOTHROID) 100 MCG tablet TAKE 1 TABLET (100 MCG TOTAL) BY MOUTH DAILY AT 6 (SIX) AM. 90 tablet 1  .  losartan (COZAAR) 50 MG tablet Take 1 tablet (50 mg total) by mouth daily. 30 tablet 11  . methotrexate 50 MG/2ML injection Inject 15 mg into the skin every Friday. Injects 0.21ml.  3  . metoprolol succinate (TOPROL-XL) 25 MG 24 hr tablet TAKE 1 TABLET (25 MG TOTAL) BY MOUTH DAILY. 30 tablet 11  . NON FORMULARY 2 liter of oxygen    . potassium chloride SA (K-DUR,KLOR-CON) 20 MEQ tablet Take 1 tablet (20 mEq total) by mouth daily.  30 tablet 0  . predniSONE (DELTASONE) 10 MG tablet Take 6 tablets by mouth daily X 1 day; take 4 tablets daily X 2 days; take 2 tablets by mouth daily x 3 days; then resume 10 mg daily. 20 tablet 0  . triamcinolone acetonide (KENALOG) 40 MG/ML injection Inject 40 mg into the muscle every 3 (three) months. For knee pain    . triamcinolone ointment (KENALOG) 0.1 % Apply 1 application topically See admin instructions. Applies twice a day as needed for break out of Sarcoidosis    . vitamin B-12 1000 MCG tablet Take 1 tablet (1,000 mcg total) by mouth daily. 30 tablet 0   No current facility-administered medications for this visit.     REVIEW OF SYSTEMS:   Constitutional: Denies fevers, chills or abnormal night sweats Eyes: Denies blurriness of vision, double vision or watery eyes Ears, nose, mouth, throat, and face: Denies mucositis or sore throat Respiratory: Denies cough, dyspnea or wheezes Cardiovascular: Denies palpitation, chest discomfort or lower extremity swelling Gastrointestinal:  Denies nausea, heartburn or change in bowel habits Skin: Denies abnormal skin rashes Lymphatics: Denies new lymphadenopathy or easy bruising Reproductive: (+) lighter period Neurological:Denies numbness, tingling or new weaknesses Behavioral/Psych: Mood is stable, no new changes  All other systems were reviewed with the patient and are negative.  PHYSICAL EXAMINATION: ECOG PERFORMANCE STATUS: 1 - Symptomatic but completely ambulatory  Vitals:   03/20/17 1210  BP: (!) 155/93    Pulse: 74  Resp: 16  Temp: 98.7 F (37.1 C)   Filed Weights   03/20/17 1210  Weight: (!) 492 lb 8 oz (223.4 kg)    03/20/17: W: 492.5 T: 98/7  O: 92   P: 74 BP: 155/93  GENERAL:alert, no distress and comfortable, Morbidly obese female, on nasal cannula oxygen SKIN: skin color, texture, turgor are normal, no rashes or significant lesions EYES: normal, conjunctiva are pink and non-injected, sclera clear OROPHARYNX:no exudate, no erythema and lips, buccal mucosa, and tongue normal  NECK: supple, thyroid normal size, non-tender, without nodularity LYMPH:  no palpable lymphadenopathy in the cervical, axillary or inguinal LUNGS: clear to auscultation and percussion with normal breathing effort HEART: regular rate & rhythm and no murmurs and no lower extremity edema ABDOMEN:abdomen soft, non-tender and normal bowel sounds Musculoskeletal:no cyanosis of digits and no clubbing  PSYCH: alert & oriented x 3 with fluent speech NEURO: no focal motor/sensory deficits  LABORATORY DATA:  I have reviewed the data as listed CBC Latest Ref Rng & Units 03/20/2017 03/02/2017 02/20/2017  WBC 3.9 - 10.3 10e3/uL 12.7(H) 9.8 16.0(H)  Hemoglobin 11.6 - 15.9 g/dL 13.5 13.4 12.9  Hematocrit 34.8 - 46.6 % 42.4 42.0 41.6  Platelets 145 - 400 10e3/uL 268 194.0 228    CMP Latest Ref Rng & Units 03/02/2017 02/16/2017 02/16/2017  Glucose 70 - 99 mg/dL 107(H) 188(H) 191(H)  BUN 6 - 23 mg/dL 17 31(H) 32(H)  Creatinine 0.40 - 1.20 mg/dL 0.74 0.77 0.83  Sodium 135 - 145 mEq/L 141 140 140  Potassium 3.5 - 5.1 mEq/L 4.2 4.3 4.3  Chloride 96 - 112 mEq/L 102 96(L) 96(L)  CO2 19 - 32 mEq/L 33(H) 33(H) 34(H)  Calcium 8.4 - 10.5 mg/dL 9.2 8.3(L) 8.4(L)  Total Protein 6.0 - 8.3 g/dL 6.9 - -  Total Bilirubin 0.2 - 1.2 mg/dL 0.5 - -  Alkaline Phos 39 - 117 U/L 45 - -  AST 0 - 37 U/L 6 - -  ALT 0 - 35 U/L 9 - -  Results for AZHAR, YOGI (MRN [id number]) as of 03/17/2017 17:58  Ref. Range 12/16/2016 07:57 01/25/2017  13:33 02/20/2017 12:54  Iron Latest Ref Range: 41 - 142 ug/dL   43  UIBC Latest Ref Range: 120 - 384 ug/dL   237  TIBC Latest Ref Range: 236 - 444 ug/dL   280  %SAT Latest Ref Range: 21 - 57 %   15 (L)  Ferritin Latest Ref Range: 9 - 269 ng/ml 31 106 69  03/20/17 PENDING  RADIOGRAPHIC STUDIES: I have personally reviewed the radiological images as listed and agreed with the findings in the report. No results found.    CT Chest WO Contrast 02/14/17 IMPRESSION: 1. No infiltrate or pulmonary edema. Bilateral upper lobe linear atelectasis right greater than left. There are artifacts from patient's large body habitus. 2. Mild atherosclerotic calcifications of thoracic aorta. Cardiomegaly is noted. No evidence of aortic aneurysm. 3. No mediastinal hematoma or adenopathy.  Patent central airway. 4. Mild degenerative changes thoracic spine.  ASSESSMENT & PLAN:  51 y.o. premenopause female, presented with chronic anemia, heavy menstrual periods, and worsening anemia lately, and required hospitalization and blood transfusions.  1. Iron deficient anemia from Menorrhagia -Her multiple iron study has showed very low level of ferritin, serum iron level and transferrin saturation, her MCV is low, consistent with iron deficient anemia. -Her iron deficiency is likely from her menorrhagia -She is not very compliant with oral iron supplement, responded well to IV iron -she has tried IUD, but did not help much and it was removed, her menorrhagia has improved some since then  -Labs reviewed and her H/H has been normal lately.  -Ferritin is pending, will consider IV iron if ferritin less than 50 -Will do lab every 6 weeks -Continue oral iron  2. Hypertension, pulmonary sarcomatosis, congestive heart failure morbid obesity -She'll continue follow-up with her primary care physician and other doctors  --She was admitted to the hospital for Hypoxia 4/14-4/19 with SOB and wheezing --She was admitted to the  hospital for Hypoxia 4/14-4/19  Plan  -f/u in 6 months -labs every 6 weeks X4 -Labs today pending, we'll set up Feraheme infusion if ferritin less than 50  All questions were answered. The patient knows to call the clinic with any problems, questions or concerns.  I spent 15 minutes counseling the patient face to face. The total time spent in the appointment was 20 minutes and more than 50% was on counseling.  This document serves as a record of services personally performed by Truitt Merle, MD. It was created on her behalf by Joslyn Devon, a trained medical scribe. The creation of this record is based on the scribe's personal observations and the provider's statements to them. This document has been checked and approved by the attending provider.   I have reviewed the above documentation for accuracy and completeness and I agree with the above.   Truitt Merle, MD 03/20/2017

## 2017-03-20 ENCOUNTER — Other Ambulatory Visit (HOSPITAL_BASED_OUTPATIENT_CLINIC_OR_DEPARTMENT_OTHER): Payer: Medicare HMO

## 2017-03-20 ENCOUNTER — Ambulatory Visit (HOSPITAL_BASED_OUTPATIENT_CLINIC_OR_DEPARTMENT_OTHER): Payer: Medicare HMO | Admitting: Hematology

## 2017-03-20 VITALS — BP 155/93 | HR 74 | Temp 98.7°F | Resp 16 | Ht 66.0 in | Wt >= 6400 oz

## 2017-03-20 DIAGNOSIS — D500 Iron deficiency anemia secondary to blood loss (chronic): Secondary | ICD-10-CM

## 2017-03-20 DIAGNOSIS — D508 Other iron deficiency anemias: Secondary | ICD-10-CM

## 2017-03-20 DIAGNOSIS — N920 Excessive and frequent menstruation with regular cycle: Secondary | ICD-10-CM | POA: Diagnosis not present

## 2017-03-20 LAB — CBC & DIFF AND RETIC
BASO%: 0.1 % (ref 0.0–2.0)
BASOS ABS: 0 10*3/uL (ref 0.0–0.1)
EOS%: 0.1 % (ref 0.0–7.0)
Eosinophils Absolute: 0 10*3/uL (ref 0.0–0.5)
HEMATOCRIT: 42.4 % (ref 34.8–46.6)
HEMOGLOBIN: 13.5 g/dL (ref 11.6–15.9)
Immature Retic Fract: 6.6 % (ref 1.60–10.00)
LYMPH%: 5.7 % — AB (ref 14.0–49.7)
MCH: 26.9 pg (ref 25.1–34.0)
MCHC: 31.8 g/dL (ref 31.5–36.0)
MCV: 84.5 fL (ref 79.5–101.0)
MONO#: 0.9 10*3/uL (ref 0.1–0.9)
MONO%: 6.9 % (ref 0.0–14.0)
NEUT#: 11.1 10*3/uL — ABNORMAL HIGH (ref 1.5–6.5)
NEUT%: 87.2 % — AB (ref 38.4–76.8)
PLATELETS: 268 10*3/uL (ref 145–400)
RBC: 5.02 10*6/uL (ref 3.70–5.45)
RDW: 19.3 % — AB (ref 11.2–14.5)
Retic %: 1.22 % (ref 0.70–2.10)
Retic Ct Abs: 61.24 10*3/uL (ref 33.70–90.70)
WBC: 12.7 10*3/uL — ABNORMAL HIGH (ref 3.9–10.3)
lymph#: 0.7 10*3/uL — ABNORMAL LOW (ref 0.9–3.3)
nRBC: 0 % (ref 0–0)

## 2017-03-20 LAB — FERRITIN: Ferritin: 53 ng/ml (ref 9–269)

## 2017-03-21 ENCOUNTER — Encounter: Payer: Self-pay | Admitting: Hematology

## 2017-03-23 ENCOUNTER — Telehealth: Payer: Self-pay | Admitting: Hematology

## 2017-03-23 NOTE — Telephone Encounter (Signed)
Patient bypassed scheduling area on 03/20/17. Appointments scheduled per 03/20/17 los. Patient was mailed an appointment letter and schedule, per 03/20/17 los.

## 2017-04-04 ENCOUNTER — Inpatient Hospital Stay: Admission: RE | Admit: 2017-04-04 | Payer: Medicare HMO | Source: Ambulatory Visit

## 2017-04-17 ENCOUNTER — Ambulatory Visit: Payer: Medicare Other | Admitting: Pulmonary Disease

## 2017-04-17 ENCOUNTER — Ambulatory Visit: Payer: Medicare HMO | Admitting: Adult Health

## 2017-05-01 ENCOUNTER — Ambulatory Visit: Payer: Medicare HMO

## 2017-05-01 ENCOUNTER — Telehealth: Payer: Self-pay | Admitting: Hematology

## 2017-05-01 ENCOUNTER — Other Ambulatory Visit: Payer: Medicare HMO

## 2017-05-01 NOTE — Telephone Encounter (Signed)
sw pt to confirm r/s infusion appt to 7/5 at 1330 per chrg Rn 7/2

## 2017-05-04 ENCOUNTER — Other Ambulatory Visit (HOSPITAL_BASED_OUTPATIENT_CLINIC_OR_DEPARTMENT_OTHER): Payer: Medicare HMO

## 2017-05-04 ENCOUNTER — Ambulatory Visit (HOSPITAL_BASED_OUTPATIENT_CLINIC_OR_DEPARTMENT_OTHER): Payer: Medicare HMO

## 2017-05-04 VITALS — BP 150/70 | HR 90 | Temp 98.9°F | Resp 18

## 2017-05-04 DIAGNOSIS — D500 Iron deficiency anemia secondary to blood loss (chronic): Secondary | ICD-10-CM

## 2017-05-04 DIAGNOSIS — N920 Excessive and frequent menstruation with regular cycle: Secondary | ICD-10-CM

## 2017-05-04 LAB — CBC & DIFF AND RETIC
BASO%: 0.5 % (ref 0.0–2.0)
BASOS ABS: 0 10*3/uL (ref 0.0–0.1)
EOS%: 3.3 % (ref 0.0–7.0)
Eosinophils Absolute: 0.3 10*3/uL (ref 0.0–0.5)
HEMATOCRIT: 34.6 % — AB (ref 34.8–46.6)
HGB: 10.8 g/dL — ABNORMAL LOW (ref 11.6–15.9)
Immature Retic Fract: 12.6 % — ABNORMAL HIGH (ref 1.60–10.00)
LYMPH%: 12.5 % — AB (ref 14.0–49.7)
MCH: 25 pg — AB (ref 25.1–34.0)
MCHC: 31.1 g/dL — AB (ref 31.5–36.0)
MCV: 80.2 fL (ref 79.5–101.0)
MONO#: 0.9 10*3/uL (ref 0.1–0.9)
MONO%: 10.2 % (ref 0.0–14.0)
NEUT#: 6.1 10*3/uL (ref 1.5–6.5)
NEUT%: 73.5 % (ref 38.4–76.8)
PLATELETS: 280 10*3/uL (ref 145–400)
RBC: 4.32 10*6/uL (ref 3.70–5.45)
RDW: 21.4 % — ABNORMAL HIGH (ref 11.2–14.5)
Retic %: 2.6 % — ABNORMAL HIGH (ref 0.70–2.10)
Retic Ct Abs: 112.32 10*3/uL — ABNORMAL HIGH (ref 33.70–90.70)
WBC: 8.3 10*3/uL (ref 3.9–10.3)
lymph#: 1 10*3/uL (ref 0.9–3.3)

## 2017-05-04 LAB — IRON AND TIBC
%SAT: 5 % — ABNORMAL LOW (ref 21–57)
Iron: 17 ug/dL — ABNORMAL LOW (ref 41–142)
TIBC: 324 ug/dL (ref 236–444)
UIBC: 307 ug/dL (ref 120–384)

## 2017-05-04 LAB — FERRITIN: Ferritin: 26 ng/ml (ref 9–269)

## 2017-05-04 MED ORDER — SODIUM CHLORIDE 0.9 % IV SOLN
510.0000 mg | Freq: Once | INTRAVENOUS | Status: AC
  Administered 2017-05-04: 510 mg via INTRAVENOUS
  Filled 2017-05-04: qty 17

## 2017-05-04 MED ORDER — SODIUM CHLORIDE 0.9 % IV SOLN
INTRAVENOUS | Status: DC
  Administered 2017-05-04: 15:00:00 via INTRAVENOUS

## 2017-05-04 NOTE — Patient Instructions (Signed)
Ferumoxytol injection What is this medicine? FERUMOXYTOL is an iron complex. Iron is used to make healthy red blood cells, which carry oxygen and nutrients throughout the body. This medicine is used to treat iron deficiency anemia in people with chronic kidney disease. COMMON BRAND NAME(S): Feraheme What should I tell my health care provider before I take this medicine? They need to know if you have any of these conditions: -anemia not caused by low iron levels -high levels of iron in the blood -magnetic resonance imaging (MRI) test scheduled -an unusual or allergic reaction to iron, other medicines, foods, dyes, or preservatives -pregnant or trying to get pregnant -breast-feeding How should I use this medicine? This medicine is for injection into a vein. It is given by a health care professional in a hospital or clinic setting. Talk to your pediatrician regarding the use of this medicine in children. Special care may be needed. What if I miss a dose? It is important not to miss your dose. Call your doctor or health care professional if you are unable to keep an appointment. What may interact with this medicine? This medicine may interact with the following medications: -other iron products What should I watch for while using this medicine? Visit your doctor or healthcare professional regularly. Tell your doctor or healthcare professional if your symptoms do not start to get better or if they get worse. You may need blood work done while you are taking this medicine. You may need to follow a special diet. Talk to your doctor. Foods that contain iron include: whole grains/cereals, dried fruits, beans, or peas, leafy green vegetables, and organ meats (liver, kidney). What side effects may I notice from receiving this medicine? Side effects that you should report to your doctor or health care professional as soon as possible: -allergic reactions like skin rash, itching or hives, swelling of the  face, lips, or tongue -breathing problems -changes in blood pressure -feeling faint or lightheaded, falls -fever or chills -flushing, sweating, or hot feelings -swelling of the ankles or feet Side effects that usually do not require medical attention (report to your doctor or health care professional if they continue or are bothersome): -diarrhea -headache -nausea, vomiting -stomach pain Where should I keep my medicine? This drug is given in a hospital or clinic and will not be stored at home.  2017 Elsevier/Gold Standard (2015-11-19 12:41:49)  

## 2017-05-11 ENCOUNTER — Ambulatory Visit (HOSPITAL_BASED_OUTPATIENT_CLINIC_OR_DEPARTMENT_OTHER): Payer: Medicare HMO

## 2017-05-11 VITALS — BP 139/73 | HR 90 | Temp 98.2°F | Resp 20 | Ht 66.0 in

## 2017-05-11 DIAGNOSIS — N920 Excessive and frequent menstruation with regular cycle: Secondary | ICD-10-CM | POA: Diagnosis not present

## 2017-05-11 DIAGNOSIS — D500 Iron deficiency anemia secondary to blood loss (chronic): Secondary | ICD-10-CM | POA: Diagnosis not present

## 2017-05-11 MED ORDER — SODIUM CHLORIDE 0.9 % IV SOLN
INTRAVENOUS | Status: DC
  Administered 2017-05-11: 09:00:00 via INTRAVENOUS

## 2017-05-11 MED ORDER — SODIUM CHLORIDE 0.9 % IV SOLN
510.0000 mg | Freq: Once | INTRAVENOUS | Status: AC
  Administered 2017-05-11: 510 mg via INTRAVENOUS
  Filled 2017-05-11: qty 17

## 2017-05-11 NOTE — Patient Instructions (Signed)

## 2017-05-17 ENCOUNTER — Telehealth: Payer: Self-pay | Admitting: *Deleted

## 2017-05-17 NOTE — Telephone Encounter (Signed)
Pt has appt for Feraheme on  Thursday 05/18/17.   Pt already received 2 doses of Feraheme  7/5 and  05/11/17.  Spoke with pt, and informed pt that she will not need to come in on 05/18/17.   Pt voiced understanding. Appt cancelled for 05/18/17.

## 2017-05-18 ENCOUNTER — Ambulatory Visit: Payer: Medicare HMO

## 2017-05-23 ENCOUNTER — Encounter (INDEPENDENT_AMBULATORY_CARE_PROVIDER_SITE_OTHER): Payer: Self-pay | Admitting: Orthopaedic Surgery

## 2017-05-23 ENCOUNTER — Ambulatory Visit (INDEPENDENT_AMBULATORY_CARE_PROVIDER_SITE_OTHER): Payer: Medicare HMO | Admitting: Orthopaedic Surgery

## 2017-05-23 VITALS — Ht 66.0 in | Wt >= 6400 oz

## 2017-05-23 DIAGNOSIS — M1711 Unilateral primary osteoarthritis, right knee: Secondary | ICD-10-CM | POA: Diagnosis not present

## 2017-05-23 DIAGNOSIS — Z79899 Other long term (current) drug therapy: Secondary | ICD-10-CM | POA: Diagnosis not present

## 2017-05-23 DIAGNOSIS — E662 Morbid (severe) obesity with alveolar hypoventilation: Secondary | ICD-10-CM | POA: Diagnosis not present

## 2017-05-23 DIAGNOSIS — M159 Polyosteoarthritis, unspecified: Secondary | ICD-10-CM | POA: Diagnosis not present

## 2017-05-23 DIAGNOSIS — M1712 Unilateral primary osteoarthritis, left knee: Secondary | ICD-10-CM

## 2017-05-23 DIAGNOSIS — Z6841 Body Mass Index (BMI) 40.0 and over, adult: Secondary | ICD-10-CM | POA: Diagnosis not present

## 2017-05-23 DIAGNOSIS — G894 Chronic pain syndrome: Secondary | ICD-10-CM | POA: Diagnosis not present

## 2017-05-23 DIAGNOSIS — R21 Rash and other nonspecific skin eruption: Secondary | ICD-10-CM | POA: Diagnosis not present

## 2017-05-23 DIAGNOSIS — D500 Iron deficiency anemia secondary to blood loss (chronic): Secondary | ICD-10-CM | POA: Diagnosis not present

## 2017-05-23 DIAGNOSIS — M064 Inflammatory polyarthropathy: Secondary | ICD-10-CM | POA: Diagnosis not present

## 2017-05-23 DIAGNOSIS — D860 Sarcoidosis of lung: Secondary | ICD-10-CM | POA: Diagnosis not present

## 2017-05-23 MED ORDER — LIDOCAINE HCL 1 % IJ SOLN
3.0000 mL | INTRAMUSCULAR | Status: AC | PRN
  Administered 2017-05-23: 3 mL

## 2017-05-23 MED ORDER — METHYLPREDNISOLONE ACETATE 40 MG/ML IJ SUSP
40.0000 mg | INTRAMUSCULAR | Status: AC | PRN
  Administered 2017-05-23: 40 mg via INTRA_ARTICULAR

## 2017-05-23 NOTE — Progress Notes (Signed)
Office Visit Note   Patient: [name]           Date of Birth: [birth year]           MRN: [id number] Visit Date: 05/23/2017              Requested by: [name], NP [place], [place] [address] PCP: [name], NP   Assessment & Plan: Visit Diagnoses:  1. Unilateral primary osteoarthritis, left knee   2. Unilateral primary osteoarthritis, right knee     Plan: We talked again about steroid injections in the risk and benefits of these. She is agreeable to proceed with bilateral knee steroid injections that she tolerated this well. We'll see her back in 3 months for repeat injections.  Follow-Up Instructions: Return in about 3 months (around 08/23/2017).   Orders:  Orders Placed This Encounter  Procedures  . Large Joint Injection/Arthrocentesis  . Large Joint Injection/Arthrocentesis   No orders of the defined types were placed in this encounter.     Procedures: Large Joint Inj Date/Time: 05/23/2017 8:17 AM Performed by: Mcarthur Rossetti Authorized by: Jean Rosenthal Y   Location:  Knee Site:  R knee Ultrasound Guidance: No   Fluoroscopic Guidance: No   Arthrogram: No   Medications:  3 mL lidocaine 1 %; 40 mg methylPREDNISolone acetate 40 MG/ML Large Joint Inj Date/Time: 05/23/2017 8:17 AM Performed by: Mcarthur Rossetti Authorized by: Mcarthur Rossetti   Location:  Knee Site:  L knee Ultrasound Guidance: No   Fluoroscopic Guidance: No   Arthrogram: No   Medications:  3 mL lidocaine 1 %; 40 mg methylPREDNISolone acetate 40 MG/ML     Clinical Data: No additional findings.   Subjective: Chief Complaint  Patient presents with  . Left Knee - Follow-up    S/p bilateral knee injections 02/20/17  . Right Knee - Follow-up  The patient's well-known to me. She has chronic bilateral knee pain and chronic severe osteophytes in both her knees. She is morbidly obese and is on chronic oxygen therapy as  well. She is in excess of 500 pounds. She comes in about every 3 months for steroid injections in her knees. She like to have them again today. She has not had significant change in her medical status recently. She understands fully the risks and benefits of these injections as well.  HPI  Review of Systems She does report some slight shortness of breath but she is on home portable oxygen. She denies any chest pain currently. She denies any fever, chills, nausea, vomiting.  Objective: Vital Signs: Ht 5\' 6"  (1.676 m)   Wt (!) 492 lb (223.2 kg)   BMI 79.41 kg/m   Physical Exam She is alert and oriented 3 and in no acute distress Ortho Exam Examination of both knee show varus malalignment with good range of motion but definitely painful May millimeter joint line of both knees. There is significant patellofemoral crepitation as well. There is a large soft tissue envelope around both knees. Specialty Comments:  No specialty comments available.  Imaging: No results found.   PMFS History: Patient Active Problem List   Diagnosis Date Noted  . Unilateral primary osteoarthritis, left knee 02/20/2017  . Unilateral primary osteoarthritis, right knee 02/20/2017  . Chronic pain of right knee 02/20/2017  . COPD exacerbation ([facility]) 02/12/2017  . GERD (gastroesophageal reflux disease) 02/12/2017  . Abnormal uterine bleeding 08/09/2016  . Diastolic dysfunction with acute on chronic heart failure ([place]) 06/17/2016  .  Microcytic anemia 02/27/2016  . Abdominal pain 02/27/2016  . Chronic diastolic CHF (congestive heart failure) ([facility]) 02/27/2016  . Hyperlipidemia 02/24/2016  . Chronic respiratory failure ([place]) 09/21/2015  . Absolute anemia   . Anxiety 04/14/2015  . Acute on chronic diastolic CHF (congestive heart failure) ([place]) 01/18/2015  . Hypoxemia   . Congestive heart failure ([place])   . Symptomatic anemia 01/15/2015  . Hypothyroidism 01/15/2015  . Morbid obesity ([place]) 01/15/2015  . Fibroids  12/09/2013  . Hyperglycemia 09/16/2013  . Left knee pain 09/09/2012  . Sarcoidosis 09/09/2012  . Iron deficiency anemia due to chronic blood loss 09/09/2012  . Hypertension 02/25/2011  . Obesity hypoventilation syndrome ([facility]) 02/14/2011  . OSA (obstructive sleep apnea) 02/10/2011   Past Medical History:  Diagnosis Date  . Anemia   . Arthritis    hands, shoulders, no meds  . CHF (congestive heart failure) ([facility])    EF60-65%  . Chronic hyperventilation syndrome    w/ obesity tx with albuterol inhaler and oxygen 2L  . COPD (chronic obstructive pulmonary disease) ([facility])    uses oxygen 2 L  . Gallstones   . GERD (gastroesophageal reflux disease)    diet controlled - no meds  . H/O hiatal hernia   . Hypertension   . Hypothyroidism   . Kidney stones   . Morbid obesity ([place] [place])   . Pneumonia    hoispitalized in 08/2011  . Sarcoidosis   . Seasonal allergies   . Shortness of breath    with exertion   . Sleep apnea    uses CPAP machine     Family History  Problem Relation Age of Onset  . Diabetes Father   . Cancer Father 17       colon cancer   . Diabetes Brother   . Deep vein thrombosis Mother   . Aneurysm Sister        d/o brain aneurysm    Past Surgical History:  Procedure Laterality Date  . [place]   x 1  . CHOLECYSTECTOMY  2000  . DILATION AND CURETTAGE OF UTERUS N/A 08/09/2016   Procedure: DILATATION AND CURETTAGE;  Surgeon: Everitt Amber, MD;  Location: [facility];  Service: Gynecology;  Laterality: N/A;  . HYSTEROSCOPY W/D&C  12/27/2011   Procedure: DILATATION AND CURETTAGE /HYSTEROSCOPY;  Surgeon: Maeola Sarah. Landry Mellow, MD;  Location: [facility];  Service: Gynecology;;  . I and D of abcess  05/2011  . INTRAUTERINE DEVICE (IUD) INSERTION N/A 08/09/2016   Procedure: INTRAUTERINE DEVICE (IUD) INSERTION;  Surgeon: Everitt Amber, MD;  Location: [facility];  Service: Gynecology;  Laterality: N/A;  . LUNG BIOPSY    . uterine abletion     Social History   Occupational History  .  unemployeed    Social History Main Topics  . Smoking status: Never Smoker  . Smokeless tobacco: Never Used  . Alcohol use Yes     Comment: occasionally  . Drug use: No  . Sexual activity: No

## 2017-05-29 DIAGNOSIS — I110 Hypertensive heart disease with heart failure: Secondary | ICD-10-CM | POA: Diagnosis not present

## 2017-05-29 DIAGNOSIS — D869 Sarcoidosis, unspecified: Secondary | ICD-10-CM | POA: Diagnosis not present

## 2017-05-29 DIAGNOSIS — I509 Heart failure, unspecified: Secondary | ICD-10-CM | POA: Diagnosis not present

## 2017-05-29 DIAGNOSIS — E039 Hypothyroidism, unspecified: Secondary | ICD-10-CM | POA: Diagnosis not present

## 2017-05-29 DIAGNOSIS — M059 Rheumatoid arthritis with rheumatoid factor, unspecified: Secondary | ICD-10-CM | POA: Diagnosis not present

## 2017-05-29 DIAGNOSIS — Z0000 Encounter for general adult medical examination without abnormal findings: Secondary | ICD-10-CM | POA: Diagnosis not present

## 2017-05-29 DIAGNOSIS — D509 Iron deficiency anemia, unspecified: Secondary | ICD-10-CM | POA: Diagnosis not present

## 2017-05-29 DIAGNOSIS — Z79899 Other long term (current) drug therapy: Secondary | ICD-10-CM | POA: Diagnosis not present

## 2017-05-29 DIAGNOSIS — J449 Chronic obstructive pulmonary disease, unspecified: Secondary | ICD-10-CM | POA: Diagnosis not present

## 2017-05-29 DIAGNOSIS — Z7952 Long term (current) use of systemic steroids: Secondary | ICD-10-CM | POA: Diagnosis not present

## 2017-06-12 ENCOUNTER — Other Ambulatory Visit: Payer: Medicare HMO

## 2017-06-14 ENCOUNTER — Other Ambulatory Visit (HOSPITAL_BASED_OUTPATIENT_CLINIC_OR_DEPARTMENT_OTHER): Payer: Medicare HMO

## 2017-06-14 DIAGNOSIS — D500 Iron deficiency anemia secondary to blood loss (chronic): Secondary | ICD-10-CM | POA: Diagnosis not present

## 2017-06-14 LAB — CBC & DIFF AND RETIC
BASO%: 0 % (ref 0.0–2.0)
BASOS ABS: 0 10*3/uL (ref 0.0–0.1)
EOS ABS: 0 10*3/uL (ref 0.0–0.5)
EOS%: 0 % (ref 0.0–7.0)
HEMATOCRIT: 41.5 % (ref 34.8–46.6)
HGB: 12.9 g/dL (ref 11.6–15.9)
Immature Retic Fract: 2.5 % (ref 1.60–10.00)
LYMPH#: 0.4 10*3/uL — AB (ref 0.9–3.3)
LYMPH%: 4.7 % — ABNORMAL LOW (ref 14.0–49.7)
MCH: 26.3 pg (ref 25.1–34.0)
MCHC: 31.1 g/dL — AB (ref 31.5–36.0)
MCV: 84.5 fL (ref 79.5–101.0)
MONO#: 0.2 10*3/uL (ref 0.1–0.9)
MONO%: 2.3 % (ref 0.0–14.0)
NEUT%: 93 % — ABNORMAL HIGH (ref 38.4–76.8)
NEUTROS ABS: 6.9 10*3/uL — AB (ref 1.5–6.5)
Platelets: 228 10*3/uL (ref 145–400)
RBC: 4.91 10*6/uL (ref 3.70–5.45)
RDW: 21.4 % — AB (ref 11.2–14.5)
RETIC %: 0.93 % (ref 0.70–2.10)
Retic Ct Abs: 45.66 10*3/uL (ref 33.70–90.70)
WBC: 7.5 10*3/uL (ref 3.9–10.3)

## 2017-06-14 LAB — FERRITIN: FERRITIN: 90 ng/mL (ref 9–269)

## 2017-06-23 ENCOUNTER — Telehealth: Payer: Self-pay | Admitting: Emergency Medicine

## 2017-06-23 NOTE — Telephone Encounter (Signed)
Called patient per Dr.Feng to inform her of Iron levels being WNL. No need for iron at this time, will continue lab monitoring this patient. Patient verbalized understanding.

## 2017-07-21 ENCOUNTER — Encounter: Payer: Self-pay | Admitting: Adult Health

## 2017-07-22 ENCOUNTER — Telehealth: Payer: Self-pay | Admitting: Adult Health

## 2017-07-24 ENCOUNTER — Other Ambulatory Visit: Payer: Medicare HMO

## 2017-07-26 ENCOUNTER — Other Ambulatory Visit: Payer: Self-pay | Admitting: Hematology

## 2017-07-26 NOTE — Telephone Encounter (Signed)
Pt needs a follow up in 1-2 months with [facility] for BP check.  Please help her to get on the schedule.  Medications sent to the pharmacy for 90 days.

## 2017-07-26 NOTE — Telephone Encounter (Signed)
Sent to the pharmacy by e-scribe for 90 days. 

## 2017-07-26 NOTE — Telephone Encounter (Signed)
Ok to send in new prescriptions. Have her follow up after she starts them

## 2017-07-26 NOTE — Telephone Encounter (Signed)
Pt never picked up the losartan (COZAAR) 50 MG tablet  OR the metoprolol succinate (TOPROL-XL) 25 MG 24 hr tablet That was sent back in May 2018  From [facility] out[facility] pharmacy. Pt does not use [facility] and cannot remember ever using them.  Pt has been taking he old medicine and went to get refilled, but [facility] Can you res[facility] to   [id number] [place] [place] [facility] [address]

## 2017-07-26 NOTE — Telephone Encounter (Signed)
Should pt come in for bp check?  Last was 172/80 in May.  Please advise.  Thanks!!

## 2017-07-27 ENCOUNTER — Other Ambulatory Visit (HOSPITAL_BASED_OUTPATIENT_CLINIC_OR_DEPARTMENT_OTHER): Payer: Medicare HMO

## 2017-07-27 DIAGNOSIS — N920 Excessive and frequent menstruation with regular cycle: Secondary | ICD-10-CM

## 2017-07-27 DIAGNOSIS — D500 Iron deficiency anemia secondary to blood loss (chronic): Secondary | ICD-10-CM

## 2017-07-27 LAB — CBC & DIFF AND RETIC
BASO%: 0.1 % (ref 0.0–2.0)
BASOS ABS: 0 10*3/uL (ref 0.0–0.1)
EOS%: 0 % (ref 0.0–7.0)
Eosinophils Absolute: 0 10*3/uL (ref 0.0–0.5)
HEMATOCRIT: 37 % (ref 34.8–46.6)
HGB: 11.1 g/dL — ABNORMAL LOW (ref 11.6–15.9)
Immature Retic Fract: 18 % — ABNORMAL HIGH (ref 1.60–10.00)
LYMPH%: 3.9 % — ABNORMAL LOW (ref 14.0–49.7)
MCH: 25.5 pg (ref 25.1–34.0)
MCHC: 30 g/dL — AB (ref 31.5–36.0)
MCV: 84.9 fL (ref 79.5–101.0)
MONO#: 0.4 10*3/uL (ref 0.1–0.9)
MONO%: 3.7 % (ref 0.0–14.0)
NEUT#: 9.4 10*3/uL — ABNORMAL HIGH (ref 1.5–6.5)
NEUT%: 92.3 % — ABNORMAL HIGH (ref 38.4–76.8)
Platelets: 222 10*3/uL (ref 145–400)
RBC: 4.36 10*6/uL (ref 3.70–5.45)
RDW: 19.6 % — AB (ref 11.2–14.5)
RETIC %: 1.47 % (ref 0.70–2.10)
Retic Ct Abs: 64.09 10*3/uL (ref 33.70–90.70)
WBC: 10.1 10*3/uL (ref 3.9–10.3)
lymph#: 0.4 10*3/uL — ABNORMAL LOW (ref 0.9–3.3)

## 2017-07-27 LAB — FERRITIN: Ferritin: 42 ng/ml (ref 9–269)

## 2017-07-27 LAB — IRON AND TIBC
%SAT: 8 % — ABNORMAL LOW (ref 21–57)
IRON: 24 ug/dL — AB (ref 41–142)
TIBC: 319 ug/dL (ref 236–444)
UIBC: 294 ug/dL (ref 120–384)

## 2017-07-27 LAB — TECHNOLOGIST REVIEW

## 2017-08-03 ENCOUNTER — Other Ambulatory Visit: Payer: Self-pay | Admitting: Adult Health

## 2017-08-04 ENCOUNTER — Telehealth: Payer: Self-pay | Admitting: *Deleted

## 2017-08-04 ENCOUNTER — Other Ambulatory Visit: Payer: Self-pay | Admitting: Family Medicine

## 2017-08-04 DIAGNOSIS — E038 Other specified hypothyroidism: Secondary | ICD-10-CM

## 2017-08-04 NOTE — Telephone Encounter (Signed)
Should pt have lab for TSH?  See last lab result.

## 2017-08-04 NOTE — Telephone Encounter (Signed)
Pt notified of iron results and need for iron infusions per Dr Ernestina Penna note. Message to scheduler.  D Sturdivant RN

## 2017-08-04 NOTE — Telephone Encounter (Signed)
She can have 30 days. I would like her to come in for a repeat lab to make sure we are on the right dose

## 2017-08-04 NOTE — Telephone Encounter (Signed)
-----   Message from Truitt Merle, MD sent at 08/04/2017  8:00 AM EDT ----- Please let pt know her iron was low, and I will schedule her iv feraheme X2 in the next few weeks. Thanks   Truitt Merle  08/04/2017

## 2017-08-04 NOTE — Telephone Encounter (Signed)
Sent to the pharmacy by e-scribe for 30 days.  Pt scheduled for lab work on 08/23/17.

## 2017-08-07 ENCOUNTER — Telehealth: Payer: Self-pay | Admitting: Hematology

## 2017-08-07 NOTE — Telephone Encounter (Signed)
Scheduled appt per 10/5 sch message - patient is aware of appt time and date

## 2017-08-10 DIAGNOSIS — E669 Obesity, unspecified: Secondary | ICD-10-CM | POA: Diagnosis not present

## 2017-08-10 DIAGNOSIS — G4733 Obstructive sleep apnea (adult) (pediatric): Secondary | ICD-10-CM | POA: Diagnosis not present

## 2017-08-15 ENCOUNTER — Telehealth: Payer: Self-pay | Admitting: Hematology

## 2017-08-15 NOTE — Telephone Encounter (Signed)
Patient called in concerned that her appointment was canceled because she received a mychart message.

## 2017-08-16 ENCOUNTER — Ambulatory Visit (HOSPITAL_BASED_OUTPATIENT_CLINIC_OR_DEPARTMENT_OTHER): Payer: Medicare HMO

## 2017-08-16 VITALS — BP 156/72 | HR 79 | Temp 98.1°F | Resp 20

## 2017-08-16 DIAGNOSIS — D500 Iron deficiency anemia secondary to blood loss (chronic): Secondary | ICD-10-CM

## 2017-08-16 DIAGNOSIS — N920 Excessive and frequent menstruation with regular cycle: Secondary | ICD-10-CM

## 2017-08-16 DIAGNOSIS — D508 Other iron deficiency anemias: Secondary | ICD-10-CM | POA: Diagnosis not present

## 2017-08-16 MED ORDER — SODIUM CHLORIDE 0.9 % IV SOLN
510.0000 mg | Freq: Once | INTRAVENOUS | Status: AC
  Administered 2017-08-16: 510 mg via INTRAVENOUS
  Filled 2017-08-16: qty 17

## 2017-08-16 NOTE — Patient Instructions (Signed)

## 2017-08-18 ENCOUNTER — Ambulatory Visit: Payer: Medicare HMO

## 2017-08-23 ENCOUNTER — Other Ambulatory Visit: Payer: Medicare HMO

## 2017-08-23 ENCOUNTER — Ambulatory Visit (HOSPITAL_BASED_OUTPATIENT_CLINIC_OR_DEPARTMENT_OTHER): Payer: Medicare HMO

## 2017-08-23 ENCOUNTER — Ambulatory Visit (INDEPENDENT_AMBULATORY_CARE_PROVIDER_SITE_OTHER): Payer: Medicare HMO | Admitting: Orthopaedic Surgery

## 2017-08-23 VITALS — BP 153/66 | HR 67 | Temp 97.9°F | Resp 20

## 2017-08-23 DIAGNOSIS — D500 Iron deficiency anemia secondary to blood loss (chronic): Secondary | ICD-10-CM

## 2017-08-23 DIAGNOSIS — M064 Inflammatory polyarthropathy: Secondary | ICD-10-CM | POA: Diagnosis not present

## 2017-08-23 DIAGNOSIS — M1711 Unilateral primary osteoarthritis, right knee: Secondary | ICD-10-CM

## 2017-08-23 DIAGNOSIS — Z79899 Other long term (current) drug therapy: Secondary | ICD-10-CM | POA: Diagnosis not present

## 2017-08-23 DIAGNOSIS — M1712 Unilateral primary osteoarthritis, left knee: Secondary | ICD-10-CM

## 2017-08-23 DIAGNOSIS — D860 Sarcoidosis of lung: Secondary | ICD-10-CM | POA: Diagnosis not present

## 2017-08-23 DIAGNOSIS — Z6841 Body Mass Index (BMI) 40.0 and over, adult: Secondary | ICD-10-CM | POA: Diagnosis not present

## 2017-08-23 DIAGNOSIS — N920 Excessive and frequent menstruation with regular cycle: Secondary | ICD-10-CM

## 2017-08-23 DIAGNOSIS — E039 Hypothyroidism, unspecified: Secondary | ICD-10-CM | POA: Diagnosis not present

## 2017-08-23 DIAGNOSIS — M159 Polyosteoarthritis, unspecified: Secondary | ICD-10-CM | POA: Diagnosis not present

## 2017-08-23 DIAGNOSIS — R0602 Shortness of breath: Secondary | ICD-10-CM | POA: Diagnosis not present

## 2017-08-23 MED ORDER — LIDOCAINE HCL 1 % IJ SOLN
3.0000 mL | INTRAMUSCULAR | Status: AC | PRN
  Administered 2017-08-23: 3 mL

## 2017-08-23 MED ORDER — METHYLPREDNISOLONE ACETATE 40 MG/ML IJ SUSP
40.0000 mg | INTRAMUSCULAR | Status: AC | PRN
  Administered 2017-08-23: 40 mg via INTRA_ARTICULAR

## 2017-08-23 MED ORDER — SODIUM CHLORIDE 0.9 % IV SOLN
Freq: Once | INTRAVENOUS | Status: AC
  Administered 2017-08-23: 14:00:00 via INTRAVENOUS

## 2017-08-23 MED ORDER — SODIUM CHLORIDE 0.9 % IV SOLN
510.0000 mg | Freq: Once | INTRAVENOUS | Status: AC
  Administered 2017-08-23: 510 mg via INTRAVENOUS
  Filled 2017-08-23: qty 17

## 2017-08-23 NOTE — Patient Instructions (Signed)

## 2017-08-23 NOTE — Progress Notes (Signed)
Office Visit Note   Patient: [name]           Date of Birth: 1966-06-13           MRN: [id number] Visit Date: 08/23/2017              Requested by: [name], NP [place], [place] [address] PCP: [name], NP   Assessment & Plan: Visit Diagnoses:  1. Unilateral primary osteoarthritis, left knee   2. Unilateral primary osteoarthritis, right knee     Plan: She understands fully that these injections are only thing we can provide for her.  She tolerated them well.  She understands the risk of injections.  We will see her back in 3 months to repeat these.  Follow-Up Instructions: Return in about 3 months (around 11/23/2017).   Orders:  Orders Placed This Encounter  Procedures  . Large Joint Injection/Arthrocentesis  . Large Joint Injection/Arthrocentesis   No orders of the defined types were placed in this encounter.     Procedures: Large Joint Inj Date/Time: 08/23/2017 8:50 AM Performed by: Mcarthur Rossetti Authorized by: Jean Rosenthal Y   Location:  Knee Site:  R knee Ultrasound Guidance: No   Fluoroscopic Guidance: No   Arthrogram: No   Medications:  3 mL lidocaine 1 %; 40 mg methylPREDNISolone acetate 40 MG/ML Large Joint Inj Date/Time: 08/23/2017 8:50 AM Performed by: Mcarthur Rossetti Authorized by: Mcarthur Rossetti   Location:  Knee Site:  L knee Ultrasound Guidance: No   Fluoroscopic Guidance: No   Arthrogram: No   Medications:  3 mL lidocaine 1 %; 40 mg methylPREDNISolone acetate 40 MG/ML     Clinical Data: No additional findings.   Subjective: No chief complaint on file. The the patient is a morbidly obese 52 year old well-known to me.  She weighs well over 400 pounds to close to 500 pounds.  She comes in for regular steroid shots in her knees due to severe end-stage arthritis and the pain she is having.  At this time that there is injections again.  She is on chronic oxygen  support as well.  She has had no changes acutely in her medical status.  HPI  Review of Systems She currently denies any chest pain, fever, chills, nausea, vomiting.  Objective: Vital Signs: There were no vitals taken for this visit.  Physical Exam She is alert and oriented x3 and in no acute distress Ortho Exam Examination of both knees show significant varus malalignment and patellofemoral crepitation. Specialty Comments:  No specialty comments available.  Imaging: No results found. There is known end-stage arthritis of bilateral knees with varus deformities and complete loss of joint space.  She again understands fully the these injections only thing we can provide for her.  PMFS History: Patient Active Problem List   Diagnosis Date Noted  . Unilateral primary osteoarthritis, left knee 02/20/2017  . Unilateral primary osteoarthritis, right knee 02/20/2017  . Chronic pain of right knee 02/20/2017  . COPD exacerbation ([place]) 02/12/2017  . GERD (gastroesophageal reflux disease) 02/12/2017  . Abnormal uterine bleeding 08/09/2016  . Diastolic dysfunction with acute on chronic heart failure ([place]) 06/17/2016  . Microcytic anemia 02/27/2016  . Abdominal pain 02/27/2016  . Chronic diastolic CHF (congestive heart failure) ([place]) 02/27/2016  . Hyperlipidemia 02/24/2016  . Chronic respiratory failure ([place]) 09/21/2015  . Absolute anemia   . Anxiety 04/14/2015  . Acute on chronic diastolic CHF (congestive heart failure) ([place]) 01/18/2015  . Hypoxemia   .  Congestive heart failure ([place])   . Symptomatic anemia 01/15/2015  . Hypothyroidism 01/15/2015  . Morbid obesity ([place]) 01/15/2015  . Fibroids 12/09/2013  . Hyperglycemia 09/16/2013  . Left knee pain 09/09/2012  . Sarcoidosis 09/09/2012  . Iron deficiency anemia due to chronic blood loss 09/09/2012  . Hypertension 02/25/2011  . Obesity hypoventilation syndrome ([place]) 02/14/2011  . OSA (obstructive sleep apnea) 02/10/2011   Past  Medical History:  Diagnosis Date  . Anemia   . Arthritis    hands, shoulders, no meds  . CHF (congestive heart failure) (H[facility])    EF60-65%  . Chronic hyperventilation syndrome    w/ obesity tx with albuterol inhaler and oxygen 2L  . COPD (chronic obstructive pulmonary disease) ([place])    uses oxygen 2 L  . Gallstones   . GERD (gastroesophageal reflux disease)    diet controlled - no meds  . H/O hiatal hernia   . Hypertension   . Hypothyroidism   . Kidney stones   . Morbid obesity ([place])   . Pneumonia    hoispitalized in 08/2011  . Sarcoidosis   . Seasonal allergies   . Shortness of breath    with exertion   . Sleep apnea    uses CPAP machine     Family History  Problem Relation Age of Onset  . Diabetes Father   . Cancer Father 79       colon cancer   . Diabetes Brother   . Deep vein thrombosis Mother   . Aneurysm Sister        d/o brain aneurysm    Past Surgical History:  Procedure Laterality Date  . [place]   x 1  . CHOLECYSTECTOMY  2000  . DILATION AND CURETTAGE OF UTERUS N/A 08/09/2016   Procedure: DILATATION AND CURETTAGE;  Surgeon: Everitt Amber, MD;  [facility];  Service: Gynecology;  Laterality: N/A;  . HYSTEROSCOPY W/D&C  12/27/2011   Procedure: DILATATION AND CURETTAGE /HYSTEROSCOPY;  Surgeon: Maeola Sarah. Landry Mellow, MD;  Location: [facility];  Service: Gynecology;;  . I and D of abcess  05/2011  . INTRAUTERINE DEVICE (IUD) INSERTION N/A 08/09/2016   Procedure: INTRAUTERINE DEVICE (IUD) INSERTION;  Surgeon: Everitt Amber, MD;  [facility];  Service: Gynecology;  Laterality: N/A;  . LUNG BIOPSY    . uterine abletion     Social History   Occupational History  . unemployeed    Social History Main Topics  . Smoking status: Never Smoker  . Smokeless tobacco: Never Used  . Alcohol use Yes     Comment: occasionally  . Drug use: No  . Sexual activity: No

## 2017-08-25 ENCOUNTER — Ambulatory Visit: Payer: Medicare HMO

## 2017-08-30 ENCOUNTER — Ambulatory Visit: Payer: Medicare HMO | Admitting: Pulmonary Disease

## 2017-08-30 NOTE — Progress Notes (Signed)
[place]  [phone] [phone]) [phone]  Clinic Follow Up Note   Patient Care Team: [name], NP as PCP - General (Family Medicine) Wonda Horner, MD (Gastroenterology) Rigoberto Noel, MD as Consulting Physician (Pulmonary Disease) 09/04/2017  CHIEF COMPLAINTS:  Follow up anemia   HISTORY OF PRESENTING ILLNESS (06/23/2016):  [name] 51 y.o. female with past medical history of sarcoidosis, on chronic continuous oxygen 2 L/min, morbid obesity, congestive heart failure, here because of her anemia. She was referred by her PCP.   She has had anemia since she was in her 24's. She has very heavy menstrual period, the last about 7 days, she has to change a pad 5-6 times a day, and she contributes her anemia to her heavy period. She underwent endometrial ablation once in the past, which did not work. She would like to have a hysterectomy, which has not been offered by her gynecologist. She has been taking iron pill one or twice a day, but not very compliant. Her anemia has been getting worse lately, she was admitted to hospital twice for severe anemia in the past year, and received blood transfusion and IV Feraheme in 12/2014 and 06/17/2016. She has been following with her PCP for her anemia.   She denies recent chest pain on exertion, shortness of breath on minimal exertion, pre-syncopal episodes, or palpitations. She had not noticed any other recent bleeding such as epistaxis, hematuria or hematochezia The patient denies over the counter NSAID ingestion. She is not on antiplatelets agents. She had no prior history or diagnosis of cancer. Her age appropriate screening programs are up-to-date. She denies any pica and eats a variety of diet. She never donated blood or received blood transfusion  CURRENT THERAPY: iv Feraheme 510mg  as needed, received on 01/16/2015, 06/17/2016, 07/01/2016, 05/04/17, 05/11/17, 08/16/17, 08/23/17  INTERIM HISTORY:    [name] returns for follow up. She presents to the clinic today noting she had IV iron last month and after infusion she had a bruise left on her left lower arm. She was stuck 5-6 times. She feels better after IV iron and she came without her oxygen. She denies any abnormal bleeding but she still has menorrhagia. She plans to see her GYN later this month. She tried a IUD but came out 3 months later. She had an ablation but this did not work. She has not had any oral Birth control before. She tried oral iron but this made her nauseated. She is fine to continue with IV iron. She would like to get flu shot here today.    MEDICAL HISTORY:  Past Medical History:  Diagnosis Date  . Anemia   . Arthritis    hands, shoulders, no meds  . CHF (congestive heart failure) (HCC)    EF60-65%  . Chronic hyperventilation syndrome    w/ obesity tx with albuterol inhaler and oxygen 2L  . COPD (chronic obstructive pulmonary disease) ([place])    uses oxygen 2 L  . Gallstones   . GERD (gastroesophageal reflux disease)    diet controlled - no meds  . H/O hiatal hernia   . Hypertension   . Hypothyroidism   . Kidney stones   . Morbid obesity ([place])   . Pneumonia    hoispitalized in 08/2011  . Sarcoidosis   . Seasonal allergies   . Shortness of breath    with exertion   . Sleep apnea    uses CPAP machine     SURGICAL  HISTORY: Past Surgical History:  Procedure Laterality Date  . [place]   x 1  . CHOLECYSTECTOMY  2000  . I and D of abcess  05/2011  . LUNG BIOPSY    . uterine abletion      SOCIAL HISTORY: Social History   Socioeconomic History  . Marital status: Single    Spouse name: Not on file  . Number of children: Not on file  . Years of education: Not on file  . Highest education level: Not on file  Social Needs  . Financial resource strain: Not on file  . Food insecurity - worry: Not on file  . Food insecurity - inability: Not on file  . Transportation  needs - medical: Not on file  . Transportation needs - non-medical: Not on file  Occupational History  . Occupation: unemployeed  Tobacco Use  . Smoking status: Never Smoker  . Smokeless tobacco: Never Used  Substance and Sexual Activity  . Alcohol use: Yes    Comment: occasionally  . Drug use: No  . Sexual activity: No    Birth control/protection: None  Other Topics Concern  . Not on file  Social History Narrative   Is not currently working.    Divorced for eight or nine years   Has one son who lives around here.     FAMILY HISTORY: Family History  Problem Relation Age of Onset  . Diabetes Father   . Cancer Father 30       colon cancer   . Diabetes Brother   . Deep vein thrombosis Mother   . Aneurysm Sister        d/o brain aneurysm    ALLERGIES:  has No Known Allergies.  MEDICATIONS:  Current Outpatient Medications  Medication Sig Dispense Refill  . albuterol (PROVENTIL HFA;VENTOLIN HFA) 108 (90 Base) MCG/ACT inhaler Inhale 1-2 puffs into the lungs every 6 (six) hours as needed for wheezing. 1 Inhaler 0  . B-D INSULIN SYRINGE 1CC/26G 26G X 1/2" 1 ML MISC AS DIRECTED USE TO INJECT METHOTREXATE ONCE EVERY WEEK 90 DAYS  1  . cetirizine (ZYRTEC) 10 MG tablet Take 10 mg by mouth at bedtime as needed for allergies.    . cholecalciferol (VITAMIN D) 400 UNITS TABS tablet Take 400 Units by mouth daily.    Marland Kitchen Coral Calcium 1000 (390 CA) MG TABS Take 1,000 mg by mouth daily.     . famotidine (PEPCID) 40 MG tablet Take 1 tablet (40 mg total) by mouth 2 (two) times daily. 60 tablet 0  . folic acid (FOLVITE) 1 MG tablet Take 1 mg by mouth daily.  3  . furosemide (LASIX) 20 MG tablet Take 3 tablets (60 mg total) by mouth 2 (two) times daily. 180 tablet 1  . HYDROcodone-acetaminophen (NORCO) 10-325 MG tablet Take 1 tablet by mouth every 6 (six) hours as needed (pain). 30 tablet 0  . levothyroxine (SYNTHROID, LEVOTHROID) 100 MCG tablet TAKE 1 TABLET (100 MCG TOTAL) BY MOUTH DAILY AT 6  (SIX) AM. 30 tablet 0  . losartan (COZAAR) 50 MG tablet TAKE 1 TABLET (50 MG TOTAL) BY MOUTH DAILY. 90 tablet 0  . methotrexate 50 MG/2ML injection Inject 15 mg into the skin every Friday. Injects 0.23ml.  3  . metoprolol succinate (TOPROL-XL) 25 MG 24 hr tablet TAKE 1 TABLET (25 MG TOTAL) BY MOUTH DAILY. 90 tablet 0  . NON FORMULARY 2 liter of oxygen    . predniSONE (DELTASONE) 10 MG tablet  Take 6 tablets by mouth daily X 1 day; take 4 tablets daily X 2 days; take 2 tablets by mouth daily x 3 days; then resume 10 mg daily. 20 tablet 0  . triamcinolone acetonide (KENALOG) 40 MG/ML injection Inject 40 mg into the muscle every 3 (three) months. For knee pain    . triamcinolone ointment (KENALOG) 0.1 % Apply 1 application topically See admin instructions. Applies twice a day as needed for break out of Sarcoidosis    . vitamin B-12 1000 MCG tablet Take 1 tablet (1,000 mcg total) by mouth daily. 30 tablet 0   No current facility-administered medications for this visit.     REVIEW OF SYSTEMS:   Constitutional: Denies fevers, chills or abnormal night sweats Eyes: Denies blurriness of vision, double vision or watery eyes Ears, nose, mouth, throat, and face: Denies mucositis or sore throat Respiratory: Denies cough, dyspnea or wheezes Cardiovascular: Denies palpitation, chest discomfort or lower extremity swelling Gastrointestinal:  Denies nausea, heartburn or change in bowel habits Skin: Denies abnormal skin rashes Lymphatics: Denies new lymphadenopathy or easy bruising Reproductive: (+) heavy periods  Neurological:Denies numbness, tingling or new weaknesses Behavioral/Psych: Mood is stable, no new changes  All other systems were reviewed with the patient and are negative.  PHYSICAL EXAMINATION: ECOG PERFORMANCE STATUS: 1 - Symptomatic but completely ambulatory  Vitals:   09/04/17 1028  BP: (!) 153/94  Pulse: 77  Resp: 19  Temp: 98.3 F (36.8 C)  SpO2: 94%   Filed Weights   09/04/17  1028  Weight: (!) 493 lb 4.8 oz (223.8 kg)     GENERAL:alert, no distress and comfortable, Morbidly obese female, on nasal cannula oxygen SKIN: skin color, texture, turgor are normal, no rashes or significant lesions EYES: normal, conjunctiva are pink and non-injected, sclera clear OROPHARYNX:no exudate, no erythema and lips, buccal mucosa, and tongue normal  NECK: supple, thyroid normal size, non-tender, without nodularity LYMPH:  no palpable lymphadenopathy in the cervical, axillary or inguinal LUNGS: clear to auscultation and percussion with normal breathing effort HEART: regular rate & rhythm and no murmurs and no lower extremity edema ABDOMEN:abdomen soft, non-tender and normal bowel sounds Musculoskeletal:no cyanosis of digits and no clubbing  PSYCH: alert & oriented x 3 with fluent speech NEURO: no focal motor/sensory deficits  LABORATORY DATA:  I have reviewed the data as listed CBC Latest Ref Rng & Units 09/04/2017 07/27/2017 06/14/2017  WBC 3.9 - 10.3 10e3/uL 11.3(H) 10.1 7.5  Hemoglobin 11.6 - 15.9 g/dL 12.2 11.1(L) 12.9  Hematocrit 34.8 - 46.6 % 39.9 37.0 41.5  Platelets 145 - 400 10e3/uL 248 222 228    CMP Latest Ref Rng & Units 03/02/2017 02/16/2017 02/16/2017  Glucose 70 - 99 mg/dL 107(H) 188(H) 191(H)  BUN 6 - 23 mg/dL 17 31(H) 32(H)  Creatinine 0.40 - 1.20 mg/dL 0.74 0.77 0.83  Sodium 135 - 145 mEq/L 141 140 140  Potassium 3.5 - 5.1 mEq/L 4.2 4.3 4.3  Chloride 96 - 112 mEq/L 102 96(L) 96(L)  CO2 19 - 32 mEq/L 33(H) 33(H) 34(H)  Calcium 8.4 - 10.5 mg/dL 9.2 8.3(L) 8.4(L)  Total Protein 6.0 - 8.3 g/dL 6.9 - -  Total Bilirubin 0.2 - 1.2 mg/dL 0.5 - -  Alkaline Phos 39 - 117 U/L 45 - -  AST 0 - 37 U/L 6 - -  ALT 0 - 35 U/L 9 - -    Results for AIRYONNA, FRANKLYN (MRN [id number]) as of 08/30/2017 16:53  Ref. Range 05/04/2017 13:56 06/14/2017 09:05  07/27/2017 09:04  Iron Latest Ref Range: 41 - 142 ug/dL 17 (L)  24 (L)  UIBC Latest Ref Range: 120 - 384 ug/dL 307  294   TIBC Latest Ref Range: 236 - 444 ug/dL 324  319  %SAT Latest Ref Range: 21 - 57 % 5 (L)  8 (L)  Ferritin Latest Ref Range: 9 - 269 ng/ml 26 90 42  PENDING 09/04/17   RADIOGRAPHIC STUDIES: I have personally reviewed the radiological images as listed and agreed with the findings in the report. No results found.    CT Chest WO Contrast 02/14/17 IMPRESSION: 1. No infiltrate or pulmonary edema. Bilateral upper lobe linear atelectasis right greater than left. There are artifacts from patient's large body habitus. 2. Mild atherosclerotic calcifications of thoracic aorta. Cardiomegaly is noted. No evidence of aortic aneurysm. 3. No mediastinal hematoma or adenopathy.  Patent central airway. 4. Mild degenerative changes thoracic spine.  ASSESSMENT & PLAN:  51 y.o. premenopause female, presented with chronic anemia, heavy menstrual periods, and worsening anemia lately, and required hospitalization and blood transfusions.  1. Iron deficient anemia from Menorrhagia -Her multiple iron study has showed very low level of ferritin, serum iron level and transferrin saturation, her MCV is low, consistent with iron deficient anemia. -Her iron deficiency is likely from her menorrhagia -She is not very compliant with oral iron supplement, responded well to IV iron -she has tried IUD, but did not help much and it was removed, her menorrhagia has improved some since then  -She has required IV iron every 2-4 months this year. She has tolerated them well. Oral Iron makes her nauseated so she plans to only proceed with IV iron.   -I discussed her anemia is mos likely related to her Menorrhagia, she will see GYN later this month and discuss ways to control this.  -Her Hg today is normal at 12.2, her Ferritin is pending, will consider IV iron if ferritin less than 50 -Will do lab every 6 weeks -F/u in 6 months  -I offered her the flu shot today, she opted to receive it.    2. Hypertension, pulmonary  sarcomatosis, congestive heart failure morbid obesity -She'll continue follow-up with her primary care physician and other doctors  --She was admitted to the hospital for Hypoxia 4/14-4/19 with SOB and wheezing --She was admitted to the hospital for Hypoxia 4/14-4/19  Plan  -Flu shot today -Lab every 6 weeks x4 -F/u in 6 months    All questions were answered. The patient knows to call the clinic with any problems, questions or concerns.  I spent 15 minutes counseling the patient face to face. The total time spent in the appointment was 20 minutes and more than 50% was on counseling.  This document serves as a record of services personally performed by Truitt Merle, MD. It was created on her behalf by Joslyn Devon, a trained medical scribe. The creation of this record is based on the scribe's personal observations and the provider's statements to them.   I have reviewed the above documentation for accuracy and completeness and I agree with the above.   Truitt Merle, MD 09/04/2017

## 2017-08-31 ENCOUNTER — Ambulatory Visit: Payer: Medicare HMO | Admitting: Pulmonary Disease

## 2017-08-31 NOTE — Telephone Encounter (Signed)
Left message for pt to call back  °

## 2017-08-31 NOTE — Telephone Encounter (Signed)
Pt has been sch for 09-13-17

## 2017-09-01 DIAGNOSIS — G4733 Obstructive sleep apnea (adult) (pediatric): Secondary | ICD-10-CM | POA: Diagnosis not present

## 2017-09-04 ENCOUNTER — Encounter: Payer: Self-pay | Admitting: Hematology

## 2017-09-04 ENCOUNTER — Other Ambulatory Visit (HOSPITAL_BASED_OUTPATIENT_CLINIC_OR_DEPARTMENT_OTHER): Payer: Medicare HMO

## 2017-09-04 ENCOUNTER — Ambulatory Visit (HOSPITAL_BASED_OUTPATIENT_CLINIC_OR_DEPARTMENT_OTHER): Payer: Medicare HMO | Admitting: Hematology

## 2017-09-04 VITALS — BP 153/94 | HR 77 | Temp 98.3°F | Resp 19 | Ht 66.0 in | Wt >= 6400 oz

## 2017-09-04 DIAGNOSIS — N920 Excessive and frequent menstruation with regular cycle: Secondary | ICD-10-CM

## 2017-09-04 DIAGNOSIS — D500 Iron deficiency anemia secondary to blood loss (chronic): Secondary | ICD-10-CM | POA: Diagnosis not present

## 2017-09-04 DIAGNOSIS — I10 Essential (primary) hypertension: Secondary | ICD-10-CM | POA: Diagnosis not present

## 2017-09-04 DIAGNOSIS — I509 Heart failure, unspecified: Secondary | ICD-10-CM | POA: Diagnosis not present

## 2017-09-04 DIAGNOSIS — Z23 Encounter for immunization: Secondary | ICD-10-CM | POA: Diagnosis not present

## 2017-09-04 LAB — CBC & DIFF AND RETIC
BASO%: 0.1 % (ref 0.0–2.0)
Basophils Absolute: 0 10*3/uL (ref 0.0–0.1)
EOS%: 0.1 % (ref 0.0–7.0)
Eosinophils Absolute: 0 10*3/uL (ref 0.0–0.5)
HCT: 39.9 % (ref 34.8–46.6)
HGB: 12.2 g/dL (ref 11.6–15.9)
Immature Retic Fract: 2.5 % (ref 1.60–10.00)
LYMPH#: 0.5 10*3/uL — AB (ref 0.9–3.3)
LYMPH%: 4.5 % — AB (ref 14.0–49.7)
MCH: 26.2 pg (ref 25.1–34.0)
MCHC: 30.6 g/dL — AB (ref 31.5–36.0)
MCV: 85.6 fL (ref 79.5–101.0)
MONO#: 0.8 10*3/uL (ref 0.1–0.9)
MONO%: 6.6 % (ref 0.0–14.0)
NEUT#: 10 10*3/uL — ABNORMAL HIGH (ref 1.5–6.5)
NEUT%: 88.7 % — AB (ref 38.4–76.8)
PLATELETS: 248 10*3/uL (ref 145–400)
RBC: 4.66 10*6/uL (ref 3.70–5.45)
RDW: 22.2 % — ABNORMAL HIGH (ref 11.2–14.5)
Retic %: 2.21 % — ABNORMAL HIGH (ref 0.70–2.10)
Retic Ct Abs: 102.99 10*3/uL — ABNORMAL HIGH (ref 33.70–90.70)
WBC: 11.3 10*3/uL — ABNORMAL HIGH (ref 3.9–10.3)

## 2017-09-04 LAB — FERRITIN: Ferritin: 110 ng/ml (ref 9–269)

## 2017-09-04 MED ORDER — INFLUENZA VAC SPLIT QUAD 0.5 ML IM SUSY
0.5000 mL | PREFILLED_SYRINGE | Freq: Once | INTRAMUSCULAR | Status: AC
  Administered 2017-09-04: 0.5 mL via INTRAMUSCULAR
  Filled 2017-09-04: qty 0.5

## 2017-09-05 ENCOUNTER — Telehealth: Payer: Self-pay | Admitting: Hematology

## 2017-09-05 NOTE — Telephone Encounter (Signed)
Spoke to patient regarding appointment updates per 11/6 los.

## 2017-09-13 ENCOUNTER — Ambulatory Visit (INDEPENDENT_AMBULATORY_CARE_PROVIDER_SITE_OTHER): Payer: Medicare HMO | Admitting: Pulmonary Disease

## 2017-09-13 ENCOUNTER — Encounter: Payer: Self-pay | Admitting: Adult Health

## 2017-09-13 ENCOUNTER — Ambulatory Visit (INDEPENDENT_AMBULATORY_CARE_PROVIDER_SITE_OTHER): Payer: Medicare HMO | Admitting: Adult Health

## 2017-09-13 ENCOUNTER — Encounter: Payer: Self-pay | Admitting: Pulmonary Disease

## 2017-09-13 VITALS — BP 180/100 | Temp 98.3°F

## 2017-09-13 DIAGNOSIS — I10 Essential (primary) hypertension: Secondary | ICD-10-CM | POA: Diagnosis not present

## 2017-09-13 DIAGNOSIS — I5032 Chronic diastolic (congestive) heart failure: Secondary | ICD-10-CM | POA: Diagnosis not present

## 2017-09-13 DIAGNOSIS — G4733 Obstructive sleep apnea (adult) (pediatric): Secondary | ICD-10-CM

## 2017-09-13 DIAGNOSIS — D869 Sarcoidosis, unspecified: Secondary | ICD-10-CM

## 2017-09-13 DIAGNOSIS — J9611 Chronic respiratory failure with hypoxia: Secondary | ICD-10-CM

## 2017-09-13 MED ORDER — LOSARTAN POTASSIUM 100 MG PO TABS: 100.0000 mg | ORAL_TABLET | Freq: Every day | ORAL | 1 refills | Status: DC

## 2017-09-13 NOTE — Progress Notes (Signed)
Subjective:    Patient ID: [name], female    DOB: Feb 22, 1966, 51 y.o.   MRN: [id number]  HPI  51 yo morbidly obese woman with OSA & BL infiltrates & hilar and mediastinal lymphadenopathy , presumed sarcoid (although TBBX neg ) with skin & joint involvement She also has inflammatory arthritis with positive ANA and follows with rheumatology [name])   She is maintained on 20 mg of injectable methotrexate by Dr. [name] And fortunately she remains on low-dose prednisone.  She is taking this sporadically, now takes 10 mg every 3-4 days she has not had a recurrence of skin rash.. Recent blood work reviewed which shows ferritin of 110 and hemoglobin of 12.2.  In the past when she was anemic due to menorrhagia her breathing was worse. CT chest from 01/2017 was reviewed which does not show parenchymal involvement or lymphadenopathy   She reports compliance with her oxygen and with he iPAP machine  Significant tests/ events:  12/2010 for acute dyspnea and hypoxia found to have OHS and OSA with recs for continuous O2 ( 2 L/m at rest &4L/m on walking)and Nocturnal CPAP  Echo- mild LVH , EF 55-60% . VQ scan intermed prob , doppler neg. CT chest neg for PE.  CPAP titration 03/2011 (wt 479 lbs) -CPAP 7 cm to stop snoring, No desaturation on O2 2 L/min   09/2011 - Hosp adm for hemoptysis &BL infiltrates on CT,p-anca pos 1.80, gbm neg, ANA neg, ESR 42  Labs c/w fe def anemia,  Underwent endometrial ablation for menorrhagia -unsuccessful  10/02/2012 TBBX >>no granulomas, BAL neg afb, fungal  12/2012 -Was seen by Dr. [name] at [facility]. repeat labs>>neg pANCA and cANCA ,ESR 48 and neg ANA , low ACE level   06/12/14  Patient reports previous biopsy of, arm, rash, by dermatology was consistent with sun exposure dermatitis.   10/2014 PFTs-FVC 55%, DLCO 44%  Adm 12/2014 for acute resp failure secondary to CHF and PNA superimposed upon underlying OSA/OHS,  sarcoidosis, and anemia -required 2 units PRBC for hemoglobin 6.7 CT angiogram chest negative for pulmonary embolus-Revealed scattered opacities bilateral   Past Medical History:  Diagnosis Date  . Anemia   . Arthritis    hands, shoulders, no meds  . CHF (congestive heart failure) (HCC)    EF60-65%  . Chronic hyperventilation syndrome    w/ obesity tx with albuterol inhaler and oxygen 2L  . COPD (chronic obstructive pulmonary disease) ([place])    uses oxygen 2 L  . Gallstones   . GERD (gastroesophageal reflux disease)    diet controlled - no meds  . H/O hiatal hernia   . Hypertension   . Hypothyroidism   . Kidney stones   . Morbid obesity ([place])   . Pneumonia    hoispitalized in 08/2011  . Sarcoidosis   . Seasonal allergies   . Shortness of breath    with exertion   . Sleep apnea    uses CPAP machine      Review of Systems neg for any significant sore throat, dysphagia, itching, sneezing, nasal congestion or excess/ purulent secretions, fever, chills, sweats, unintended wt loss, pleuritic or exertional cp, hempoptysis, orthopnea pnd or change in chronic leg swelling. Also denies presyncope, palpitations, heartburn, abdominal pain, nausea, vomiting, diarrhea or change in bowel or urinary habits, dysuria,hematuria, rash, arthralgias, visual complaints, headache, numbness weakness or ataxia.     Objective:   Physical Exam   Gen. Pleasant, obese, in no distress, normal affect ENT -  no lesions, no post nasal drip, class 2-3 airway Neck: No JVD, no thyromegaly, no carotid bruits Lungs: no use of accessory muscles, no dullness to percussion, decreased without rales or rhonchi  Cardiovascular: Rhythm regular, heart sounds  normal, no murmurs or gallops, 2+  peripheral edema Abdomen: soft and non-tender, no hepatosplenomegaly, BS normal. Musculoskeletal: No deformities, no cyanosis or clubbing Neuro:  alert, non focal, no tremors        Assessment & Plan:

## 2017-09-13 NOTE — Assessment & Plan Note (Addendum)
I would question whether your sarcoidosis is still active.  Difficult to monitor disease activity unclear if arthritis is more related to osteoarthritis now rather than sarcoid. CT scan from 01/2017 looks good.  Decrease prednisone to 10 mg every Monday/Wednesday/Friday. Starting Jan first, if feeling well, decrease prednisone to 5 mg on these days  Can stay on same doses of methotrexate.  Goal would be to eventually taper off prednisone completely

## 2017-09-13 NOTE — Assessment & Plan Note (Signed)
  We will check download from her CPAP machine  Weight loss encouraged, compliance with goal of at least 4-6 hrs every night is the expectation. Advised against medications with sedative side effects Cautioned against driving when sleepy - understanding that sleepiness will vary on a day to day basis

## 2017-09-13 NOTE — Assessment & Plan Note (Signed)
Continue oxygen 2 L on exertion

## 2017-09-13 NOTE — Patient Instructions (Signed)
  I would question whether your sarcoidosis is still active. CT scan from 01/2017 looks good.  Decrease prednisone to 10 mg every Monday/Wednesday/Friday. Starting Jan first, if feeling well, decrease prednisone to 5 mg on these days  Can stay on same doses of methotrexate.  We will check download from her CPAP machine

## 2017-09-13 NOTE — Assessment & Plan Note (Signed)
Continue Lasix daily-emphasized compliance

## 2017-09-13 NOTE — Progress Notes (Signed)
Subjective:    Patient ID: [name], female    DOB: 02-Apr-1966, 51 y.o.   MRN: [id number]  HPI  51 year old female who  has a past medical history of Anemia, Arthritis, CHF (congestive heart failure) ([place]), Chronic hyperventilation syndrome, COPD (chronic obstructive pulmonary disease) ([place]), Gallstones, GERD (gastroesophageal reflux disease), H/O hiatal hernia, Hypertension, Hypothyroidism, Kidney stones, Morbid obesity ([place]), Pneumonia, Sarcoidosis, Seasonal allergies, Shortness of breath, and Sleep apnea.  She presents to the office today for follow up regarding hypertension. She reports that she did not take her medication this morning because " I had to get ready to come here and I just forgot." She is currently maintained on on Cozaar 50 mg and Toprol 25 mg daily. In the office today on first blood pressure check her BP was 200/100 and 180/100 on second check. Due to her body habitus, getting an accurate BP is difficult.   She does endorse a a frontal headache but does not have any dizziness.   BP Readings from Last 3 Encounters:  09/13/17 (!) 180/100  09/04/17 (!) 153/94  08/23/17 (!) 153/66     Review of Systems See HPI   Past Medical History:  Diagnosis Date  . Anemia   . Arthritis    hands, shoulders, no meds  . CHF (congestive heart failure) (H[facility])    EF60-65%  . Chronic hyperventilation syndrome    w/ obesity tx with albuterol inhaler and oxygen 2L  . COPD (chronic obstructive pulmonary disease) ([place])    uses oxygen 2 L  . Gallstones   . GERD (gastroesophageal reflux disease)    diet controlled - no meds  . H/O hiatal hernia   . Hypertension   . Hypothyroidism   . Kidney stones   . Morbid obesity ([place])   . Pneumonia    hoispitalized in 08/2011  . Sarcoidosis   . Seasonal allergies   . Shortness of breath    with exertion   . Sleep apnea    uses CPAP machine     Social History   Socioeconomic History  . Marital status: Single    Spouse  name: Not on file  . Number of children: Not on file  . Years of education: Not on file  . Highest education level: Not on file  Social Needs  . Financial resource strain: Not on file  . Food insecurity - worry: Not on file  . Food insecurity - inability: Not on file  . Transportation needs - medical: Not on file  . Transportation needs - non-medical: Not on file  Occupational History  . Occupation: unemployeed  Tobacco Use  . Smoking status: Never Smoker  . Smokeless tobacco: Never Used  Substance and Sexual Activity  . Alcohol use: Yes    Comment: occasionally  . Drug use: No  . Sexual activity: No    Birth control/protection: None  Other Topics Concern  . Not on file  Social History Narrative   Is not currently working.    Divorced for eight or nine years   Has one son who lives around here.     Past Surgical History:  Procedure Laterality Date  . [place]   x 1  . CHOLECYSTECTOMY  2000  . I and D of abcess  05/2011  . LUNG BIOPSY    . uterine abletion      Family History  Problem Relation Age of Onset  . Diabetes Father   . Cancer  Father 42       colon cancer   . Diabetes Brother   . Deep vein thrombosis Mother   . Aneurysm Sister        d/o brain aneurysm    No Known Allergies  Current Outpatient Medications on File Prior to Visit  Medication Sig Dispense Refill  . albuterol (PROVENTIL HFA;VENTOLIN HFA) 108 (90 Base) MCG/ACT inhaler Inhale 1-2 puffs into the lungs every 6 (six) hours as needed for wheezing. 1 Inhaler 0  . B-D INSULIN SYRINGE 1CC/26G 26G X 1/2" 1 ML MISC AS DIRECTED USE TO INJECT METHOTREXATE ONCE EVERY WEEK 90 DAYS  1  . cetirizine (ZYRTEC) 10 MG tablet Take 10 mg by mouth at bedtime as needed for allergies.    . cholecalciferol (VITAMIN D) 400 UNITS TABS tablet Take 400 Units by mouth daily.    Marland Kitchen [place] Calcium 1000 (390 CA) MG TABS Take 1,000 mg by mouth daily.     . famotidine (PEPCID) 40 MG tablet Take 1 tablet (40 mg  total) by mouth 2 (two) times daily. 60 tablet 0  . folic acid (FOLVITE) 1 MG tablet Take 1 mg by mouth daily.  3  . furosemide (LASIX) 20 MG tablet Take 3 tablets (60 mg total) by mouth 2 (two) times daily. 180 tablet 1  . HYDROcodone-acetaminophen (NORCO) 10-325 MG tablet Take 1 tablet by mouth every 6 (six) hours as needed (pain). 30 tablet 0  . levothyroxine (SYNTHROID, LEVOTHROID) 100 MCG tablet TAKE 1 TABLET (100 MCG TOTAL) BY MOUTH DAILY AT 6 (SIX) AM. 30 tablet 0  . methotrexate 50 MG/2ML injection Inject 15 mg into the skin every Friday. Injects 0.87ml.  3  . metoprolol succinate (TOPROL-XL) 25 MG 24 hr tablet TAKE 1 TABLET (25 MG TOTAL) BY MOUTH DAILY. 90 tablet 0  . NON FORMULARY 2 liter of oxygen    . predniSONE (DELTASONE) 10 MG tablet Take 6 tablets by mouth daily X 1 day; take 4 tablets daily X 2 days; take 2 tablets by mouth daily x 3 days; then resume 10 mg daily. 20 tablet 0  . triamcinolone acetonide (KENALOG) 40 MG/ML injection Inject 40 mg into the muscle every 3 (three) months. For knee pain    . triamcinolone ointment (KENALOG) 0.1 % Apply 1 application topically See admin instructions. Applies twice a day as needed for break out of Sarcoidosis    . vitamin B-12 1000 MCG tablet Take 1 tablet (1,000 mcg total) by mouth daily. 30 tablet 0   No current facility-administered medications on file prior to visit.     BP (!) 180/100   Temp 98.3 F (36.8 C) (Oral)       Objective:   Physical Exam  Constitutional: She is oriented to person, place, and time. She appears well-developed and well-nourished. No distress.  Morbidly obese    Eyes: Conjunctivae and EOM are normal. Pupils are equal, round, and reactive to light.  Cardiovascular: Normal rate, regular rhythm, normal heart sounds and intact distal pulses. Exam reveals no gallop and no friction rub.  No murmur heard. Pulmonary/Chest: Effort normal and breath sounds normal. No respiratory distress. She has no wheezes. She  has no rales. She exhibits no tenderness.  Neurological: She is alert and oriented to person, place, and time.  Skin: Skin is warm and dry. No rash noted. She is not diaphoretic. No erythema. No pallor.  Psychiatric: She has a normal mood and affect. Her behavior is normal. Judgment and thought content  normal.  Nursing note and vitals reviewed.     Assessment & Plan:  1. Essential hypertension - There is concern on my behalf that she often forgets to take her medication. I have increased her Losartan from 50 mg to 100 mg. I am going to have her go home and take her medications immediately following this appointment.  - She had the choice of catapres 0.1 mg in the office today, but she chose to go home and take her meds  - I would like her to follow up in 1 week or sooner if needed - Needs to go to the ER with headache or stroke like symptoms   [name], NP

## 2017-09-27 ENCOUNTER — Other Ambulatory Visit: Payer: Self-pay | Admitting: Adult Health

## 2017-09-27 NOTE — Telephone Encounter (Signed)
I will need that lab drawn to make sure we are on the right dose

## 2017-09-27 NOTE — Telephone Encounter (Signed)
Sent to the pharmacy by e-scribe. 

## 2017-09-27 NOTE — Telephone Encounter (Signed)
She can have 30 days but there will be no more refills until a new TSH is drawn

## 2017-09-27 NOTE — Telephone Encounter (Signed)
Last TSH on 03/02/17 was abnormal.  Pt did not complete follow up TSH (ordered in Epic).  Please advise.

## 2017-09-27 NOTE — Telephone Encounter (Signed)
Spoke to the pt.  She does not plan to come see any more doctors or do lab work until after the first of the year.  Pt refused to make an appointment.  Please advise.

## 2017-10-16 ENCOUNTER — Other Ambulatory Visit: Payer: Medicare HMO

## 2017-10-18 ENCOUNTER — Other Ambulatory Visit (HOSPITAL_BASED_OUTPATIENT_CLINIC_OR_DEPARTMENT_OTHER): Payer: Medicare HMO

## 2017-10-18 DIAGNOSIS — D500 Iron deficiency anemia secondary to blood loss (chronic): Secondary | ICD-10-CM

## 2017-10-18 DIAGNOSIS — N920 Excessive and frequent menstruation with regular cycle: Secondary | ICD-10-CM | POA: Diagnosis not present

## 2017-10-18 LAB — CBC & DIFF AND RETIC
BASO%: 0.1 % (ref 0.0–2.0)
BASOS ABS: 0 10*3/uL (ref 0.0–0.1)
EOS ABS: 0 10*3/uL (ref 0.0–0.5)
EOS%: 0 % (ref 0.0–7.0)
HEMATOCRIT: 39 % (ref 34.8–46.6)
HEMOGLOBIN: 12 g/dL (ref 11.6–15.9)
Immature Retic Fract: 7.8 % (ref 1.60–10.00)
LYMPH#: 0.4 10*3/uL — AB (ref 0.9–3.3)
LYMPH%: 5.6 % — ABNORMAL LOW (ref 14.0–49.7)
MCH: 26.3 pg (ref 25.1–34.0)
MCHC: 30.8 g/dL — ABNORMAL LOW (ref 31.5–36.0)
MCV: 85.3 fL (ref 79.5–101.0)
MONO#: 0.1 10*3/uL (ref 0.1–0.9)
MONO%: 1.9 % (ref 0.0–14.0)
NEUT#: 6.5 10*3/uL (ref 1.5–6.5)
NEUT%: 92.4 % — AB (ref 38.4–76.8)
NRBC: 0 % (ref 0–0)
Platelets: 210 10*3/uL (ref 145–400)
RBC: 4.57 10*6/uL (ref 3.70–5.45)
RDW: 19.8 % — AB (ref 11.2–14.5)
RETIC %: 1.43 % (ref 0.70–2.10)
RETIC CT ABS: 65.35 10*3/uL (ref 33.70–90.70)
WBC: 7 10*3/uL (ref 3.9–10.3)

## 2017-10-18 LAB — IRON AND TIBC
%SAT: 6 % — ABNORMAL LOW (ref 21–57)
IRON: 20 ug/dL — AB (ref 41–142)
TIBC: 303 ug/dL (ref 236–444)
UIBC: 283 ug/dL (ref 120–384)

## 2017-10-18 LAB — FERRITIN: Ferritin: 33 ng/ml (ref 9–269)

## 2017-10-18 LAB — TECHNOLOGIST REVIEW

## 2017-10-30 ENCOUNTER — Telehealth: Payer: Self-pay | Admitting: *Deleted

## 2017-10-30 NOTE — Telephone Encounter (Signed)
-----   Message from Truitt Merle, MD sent at 10/28/2017  1:36 PM EST ----- Please let pt know the lab result, ferritin is less than goal of 50, I will schedule one iv feraheme, thanks  Truitt Merle  10/28/2017

## 2017-10-30 NOTE — Telephone Encounter (Signed)
TCT patient regarding labs done on 10/18/18.  Spoke with pt and informed her that her iron level was low. Pt stated she had seen results on [place] and was expecting a call from Dr. Ernestina Penna office.  Advised her that Dr. Burr Medico will have a fereheme infusion set up and to expect a call from scheduling department.  Pt voiced understanding. No other questions or concerns.

## 2017-11-01 ENCOUNTER — Telehealth: Payer: Self-pay | Admitting: Hematology

## 2017-11-01 NOTE — Telephone Encounter (Signed)
Scheduled appt per 12/29 sch message - patient is aware of appt date and time.

## 2017-11-03 ENCOUNTER — Ambulatory Visit (HOSPITAL_BASED_OUTPATIENT_CLINIC_OR_DEPARTMENT_OTHER): Payer: Medicare HMO

## 2017-11-03 ENCOUNTER — Telehealth: Payer: Self-pay | Admitting: *Deleted

## 2017-11-03 ENCOUNTER — Telehealth: Payer: Self-pay | Admitting: Pulmonary Disease

## 2017-11-03 VITALS — BP 155/61 | HR 81 | Temp 98.8°F | Resp 18

## 2017-11-03 DIAGNOSIS — D500 Iron deficiency anemia secondary to blood loss (chronic): Secondary | ICD-10-CM | POA: Diagnosis not present

## 2017-11-03 DIAGNOSIS — N920 Excessive and frequent menstruation with regular cycle: Secondary | ICD-10-CM

## 2017-11-03 MED ORDER — FERUMOXYTOL INJECTION 510 MG/17 ML
510.0000 mg | Freq: Once | INTRAVENOUS | Status: AC
  Administered 2017-11-03: 510 mg via INTRAVENOUS
  Filled 2017-11-03: qty 17

## 2017-11-03 NOTE — Telephone Encounter (Signed)
Spoke with patient regarding not breathing well Pt walked roughly 45ft and O2 drops in 70's, recovers in 2-3 minutes Pt is on 2 liters O2 con't; pt is having breathing problems last 5 days Advised pt to seek ED immediately Pt advised she is having iron infusion at [facility],  Afterwards pt is going down to the ER at [facility]  Nothing further needed

## 2017-11-03 NOTE — Patient Instructions (Signed)

## 2017-11-03 NOTE — Telephone Encounter (Signed)
Received vm call from pt stating that her Oxygen level is dropping to 70 % when she walks to the [place].  She feels that she needs to get her iron today.  Returned call to pt after discussing with Dr Burr Medico & informed pt that this is probably not due to her iron level since she was not anemic on last visit & is more likely related to her COPD & she should see her PCP.  Pt expressed understanding but scheduler has already scheduled her for iron today.

## 2017-11-08 ENCOUNTER — Ambulatory Visit: Payer: Medicare HMO

## 2017-11-09 ENCOUNTER — Inpatient Hospital Stay: Payer: Medicare HMO | Attending: Hematology

## 2017-11-09 VITALS — BP 173/71 | HR 77 | Temp 98.6°F | Resp 18

## 2017-11-09 DIAGNOSIS — N924 Excessive bleeding in the premenopausal period: Secondary | ICD-10-CM | POA: Insufficient documentation

## 2017-11-09 DIAGNOSIS — Z79899 Other long term (current) drug therapy: Secondary | ICD-10-CM | POA: Insufficient documentation

## 2017-11-09 DIAGNOSIS — D500 Iron deficiency anemia secondary to blood loss (chronic): Secondary | ICD-10-CM | POA: Diagnosis present

## 2017-11-09 MED ORDER — FERUMOXYTOL INJECTION 510 MG/17 ML
510.0000 mg | Freq: Once | INTRAVENOUS | Status: AC
  Administered 2017-11-09: 510 mg via INTRAVENOUS
  Filled 2017-11-09: qty 17

## 2017-11-09 NOTE — Patient Instructions (Signed)

## 2017-11-14 DIAGNOSIS — R69 Illness, unspecified: Secondary | ICD-10-CM | POA: Diagnosis not present

## 2017-11-20 ENCOUNTER — Other Ambulatory Visit: Payer: Self-pay | Admitting: Adult Health

## 2017-11-22 ENCOUNTER — Ambulatory Visit (INDEPENDENT_AMBULATORY_CARE_PROVIDER_SITE_OTHER): Payer: Medicare HMO | Admitting: Orthopaedic Surgery

## 2017-11-22 NOTE — Telephone Encounter (Signed)
Sent to the pharmacy for 30 days.  90 day rx request denied.  Message sent to the pharmacy.

## 2017-11-22 NOTE — Telephone Encounter (Signed)
Ok to refill for 30 days. Needs follow up for more refills

## 2017-11-22 NOTE — Telephone Encounter (Signed)
Pt has not followed up

## 2017-11-27 ENCOUNTER — Other Ambulatory Visit: Payer: Medicare HMO

## 2017-11-29 ENCOUNTER — Ambulatory Visit (INDEPENDENT_AMBULATORY_CARE_PROVIDER_SITE_OTHER): Payer: Medicare HMO | Admitting: Orthopaedic Surgery

## 2017-11-29 ENCOUNTER — Ambulatory Visit (INDEPENDENT_AMBULATORY_CARE_PROVIDER_SITE_OTHER): Payer: Medicare HMO | Admitting: Physician Assistant

## 2017-11-29 ENCOUNTER — Inpatient Hospital Stay: Payer: Medicare HMO

## 2017-11-29 DIAGNOSIS — D500 Iron deficiency anemia secondary to blood loss (chronic): Secondary | ICD-10-CM | POA: Diagnosis not present

## 2017-11-29 DIAGNOSIS — N924 Excessive bleeding in the premenopausal period: Secondary | ICD-10-CM | POA: Diagnosis not present

## 2017-11-29 DIAGNOSIS — Z79899 Other long term (current) drug therapy: Secondary | ICD-10-CM | POA: Diagnosis not present

## 2017-11-29 LAB — CBC WITH DIFFERENTIAL (CANCER CENTER ONLY)
Basophils Absolute: 0 K/uL (ref 0.0–0.1)
Basophils Relative: 0 %
Eosinophils Absolute: 0 K/uL (ref 0.0–0.5)
Eosinophils Relative: 0 %
HCT: 41.4 % (ref 34.8–46.6)
Hemoglobin: 12.7 g/dL (ref 11.6–15.9)
Lymphocytes Relative: 6 %
Lymphs Abs: 0.4 K/uL — ABNORMAL LOW (ref 0.9–3.3)
MCH: 26.8 pg (ref 25.1–34.0)
MCHC: 30.7 g/dL — ABNORMAL LOW (ref 31.5–36.0)
MCV: 87.5 fL (ref 79.5–101.0)
Monocytes Absolute: 0.2 K/uL (ref 0.1–0.9)
Monocytes Relative: 3 %
Neutro Abs: 6.3 K/uL (ref 1.5–6.5)
Neutrophils Relative %: 91 %
Platelet Count: 228 K/uL (ref 145–400)
RBC: 4.73 MIL/uL (ref 3.70–5.45)
RDW: 20.6 % — ABNORMAL HIGH (ref 11.2–16.1)
WBC Count: 6.9 K/uL (ref 3.9–10.3)

## 2017-11-29 LAB — FERRITIN: Ferritin: 99 ng/mL (ref 9–269)

## 2017-11-29 LAB — RETICULOCYTES
RBC.: 4.73 MIL/uL (ref 3.70–5.45)
Retic Count, Absolute: 108.8 10*3/uL — ABNORMAL HIGH (ref 33.7–90.7)
Retic Ct Pct: 2.3 % — ABNORMAL HIGH (ref 0.7–2.1)

## 2017-12-07 ENCOUNTER — Telehealth: Payer: Self-pay | Admitting: *Deleted

## 2017-12-07 NOTE — Telephone Encounter (Signed)
Called pt and left message on voice mail re: iron level adequate, no anemia, no need for IV iron for now as per Dr. Ernestina Penna instructions.

## 2017-12-07 NOTE — Telephone Encounter (Signed)
-----   Message from Truitt Merle, MD sent at 12/02/2017  3:33 PM EST ----- Please let pt know her iron level is adequate, no anemia, no need for iv iron for now. Thanks  Truitt Merle  12/02/2017

## 2017-12-14 ENCOUNTER — Ambulatory Visit (INDEPENDENT_AMBULATORY_CARE_PROVIDER_SITE_OTHER): Payer: Medicare HMO | Admitting: Physician Assistant

## 2017-12-14 ENCOUNTER — Ambulatory Visit (INDEPENDENT_AMBULATORY_CARE_PROVIDER_SITE_OTHER): Payer: Medicare HMO | Admitting: Orthopaedic Surgery

## 2017-12-14 DIAGNOSIS — M159 Polyosteoarthritis, unspecified: Secondary | ICD-10-CM | POA: Diagnosis not present

## 2017-12-14 DIAGNOSIS — Z79899 Other long term (current) drug therapy: Secondary | ICD-10-CM | POA: Diagnosis not present

## 2017-12-14 DIAGNOSIS — Z6841 Body Mass Index (BMI) 40.0 and over, adult: Secondary | ICD-10-CM | POA: Diagnosis not present

## 2017-12-14 DIAGNOSIS — D500 Iron deficiency anemia secondary to blood loss (chronic): Secondary | ICD-10-CM | POA: Diagnosis not present

## 2017-12-14 DIAGNOSIS — M064 Inflammatory polyarthropathy: Secondary | ICD-10-CM | POA: Diagnosis not present

## 2017-12-14 DIAGNOSIS — E039 Hypothyroidism, unspecified: Secondary | ICD-10-CM | POA: Diagnosis not present

## 2017-12-14 DIAGNOSIS — R0602 Shortness of breath: Secondary | ICD-10-CM | POA: Diagnosis not present

## 2017-12-14 DIAGNOSIS — D860 Sarcoidosis of lung: Secondary | ICD-10-CM | POA: Diagnosis not present

## 2017-12-15 ENCOUNTER — Ambulatory Visit: Payer: Medicare HMO | Admitting: Adult Health

## 2017-12-20 ENCOUNTER — Emergency Department (HOSPITAL_COMMUNITY)
Admission: EM | Admit: 2017-12-20 | Discharge: 2017-12-20 | Discharge status: Against Medical Advice | Payer: Medicare HMO | Attending: Emergency Medicine | Admitting: Emergency Medicine

## 2017-12-20 ENCOUNTER — Ambulatory Visit: Payer: Self-pay

## 2017-12-20 DIAGNOSIS — Z5321 Procedure and treatment not carried out due to patient leaving prior to being seen by health care provider: Secondary | ICD-10-CM | POA: Diagnosis not present

## 2017-12-20 DIAGNOSIS — R0602 Shortness of breath: Secondary | ICD-10-CM | POA: Diagnosis present

## 2017-12-20 NOTE — Telephone Encounter (Signed)
Patient called and stated "I'm sitting here in the emergency room at [facility]. I have been here for hours and now my oxygen has run out. I am here because of CP and SOB." I asked can she let someone there know she is out of oxygen, she says "I've already told someone 1 hour ago that I was out and nobody came to take me back. I am uncomfortable and I am getting ready to go home to get on my own oxygen when my son pulls the car up. I will just call and make an appointment to see my doctor." I asked her to hold on while I call to the nurse desk, she agreed. I called and spoke with a nurse who informed me that it's documented the patient was offered oxygen and she refused it and that she's been there for 2 hours. I asked if she could send someone out there with a tank and I would encourage the patient to stay. I spoke back to the patient and informed her that someone would be bringing her an oxygen tank and advised her to stay to be evaluated for her CP and SOB, she says "I can't sit here uncomfortable any longer. My son is here with the car and I'm going home."

## 2017-12-20 NOTE — ED Notes (Addendum)
This Probation officer offered an oxygen tank to the pt while she waits because she is on oxygen at home pt refused and stated to this writer that she was uncomfortable and was going to leave and didn't want to wait any longer

## 2017-12-21 NOTE — Telephone Encounter (Signed)
Noted. Thanks.

## 2017-12-21 NOTE — Telephone Encounter (Signed)
FYI

## 2017-12-22 ENCOUNTER — Encounter: Payer: Self-pay | Admitting: Adult Health

## 2018-01-08 ENCOUNTER — Other Ambulatory Visit: Payer: Medicare HMO

## 2018-01-08 ENCOUNTER — Other Ambulatory Visit: Payer: Self-pay | Admitting: Pulmonary Disease

## 2018-01-10 ENCOUNTER — Other Ambulatory Visit: Payer: Medicare HMO

## 2018-01-10 ENCOUNTER — Inpatient Hospital Stay (HOSPITAL_COMMUNITY)
Admission: EM | Admit: 2018-01-10 | Discharge: 2018-01-17 | DRG: 291 | Disposition: A | Discharge status: Home / Self Care | Payer: Medicare HMO | Attending: Internal Medicine | Admitting: Internal Medicine

## 2018-01-10 ENCOUNTER — Other Ambulatory Visit: Payer: Self-pay

## 2018-01-10 ENCOUNTER — Encounter (HOSPITAL_COMMUNITY): Payer: Self-pay | Admitting: Emergency Medicine

## 2018-01-10 ENCOUNTER — Emergency Department (HOSPITAL_COMMUNITY): Payer: Medicare HMO

## 2018-01-10 DIAGNOSIS — I110 Hypertensive heart disease with heart failure: Secondary | ICD-10-CM | POA: Diagnosis present

## 2018-01-10 DIAGNOSIS — T501X6A Underdosing of loop [high-ceiling] diuretics, initial encounter: Secondary | ICD-10-CM | POA: Diagnosis present

## 2018-01-10 DIAGNOSIS — D899 Disorder involving the immune mechanism, unspecified: Secondary | ICD-10-CM | POA: Diagnosis present

## 2018-01-10 DIAGNOSIS — D860 Sarcoidosis of lung: Secondary | ICD-10-CM | POA: Diagnosis present

## 2018-01-10 DIAGNOSIS — E039 Hypothyroidism, unspecified: Secondary | ICD-10-CM | POA: Diagnosis present

## 2018-01-10 DIAGNOSIS — D869 Sarcoidosis, unspecified: Secondary | ICD-10-CM | POA: Diagnosis present

## 2018-01-10 DIAGNOSIS — T380X6A Underdosing of glucocorticoids and synthetic analogues, initial encounter: Secondary | ICD-10-CM | POA: Diagnosis present

## 2018-01-10 DIAGNOSIS — J9610 Chronic respiratory failure, unspecified whether with hypoxia or hypercapnia: Secondary | ICD-10-CM

## 2018-01-10 DIAGNOSIS — J9621 Acute and chronic respiratory failure with hypoxia: Secondary | ICD-10-CM | POA: Diagnosis present

## 2018-01-10 DIAGNOSIS — Z6841 Body Mass Index (BMI) 40.0 and over, adult: Secondary | ICD-10-CM | POA: Diagnosis not present

## 2018-01-10 DIAGNOSIS — I498 Other specified cardiac arrhythmias: Secondary | ICD-10-CM | POA: Diagnosis present

## 2018-01-10 DIAGNOSIS — J90 Pleural effusion, not elsewhere classified: Secondary | ICD-10-CM | POA: Diagnosis not present

## 2018-01-10 DIAGNOSIS — I509 Heart failure, unspecified: Secondary | ICD-10-CM | POA: Diagnosis not present

## 2018-01-10 DIAGNOSIS — D649 Anemia, unspecified: Secondary | ICD-10-CM | POA: Diagnosis present

## 2018-01-10 DIAGNOSIS — Z9989 Dependence on other enabling machines and devices: Secondary | ICD-10-CM

## 2018-01-10 DIAGNOSIS — G4733 Obstructive sleep apnea (adult) (pediatric): Secondary | ICD-10-CM | POA: Diagnosis present

## 2018-01-10 DIAGNOSIS — Z975 Presence of (intrauterine) contraceptive device: Secondary | ICD-10-CM

## 2018-01-10 DIAGNOSIS — M19042 Primary osteoarthritis, left hand: Secondary | ICD-10-CM | POA: Diagnosis present

## 2018-01-10 DIAGNOSIS — E876 Hypokalemia: Secondary | ICD-10-CM | POA: Diagnosis not present

## 2018-01-10 DIAGNOSIS — I5033 Acute on chronic diastolic (congestive) heart failure: Secondary | ICD-10-CM | POA: Diagnosis present

## 2018-01-10 DIAGNOSIS — J449 Chronic obstructive pulmonary disease, unspecified: Secondary | ICD-10-CM | POA: Diagnosis present

## 2018-01-10 DIAGNOSIS — Z9981 Dependence on supplemental oxygen: Secondary | ICD-10-CM

## 2018-01-10 DIAGNOSIS — J9611 Chronic respiratory failure with hypoxia: Secondary | ICD-10-CM | POA: Diagnosis not present

## 2018-01-10 DIAGNOSIS — D500 Iron deficiency anemia secondary to blood loss (chronic): Secondary | ICD-10-CM | POA: Diagnosis present

## 2018-01-10 DIAGNOSIS — R0602 Shortness of breath: Secondary | ICD-10-CM | POA: Diagnosis not present

## 2018-01-10 DIAGNOSIS — I472 Ventricular tachycardia: Secondary | ICD-10-CM | POA: Diagnosis not present

## 2018-01-10 DIAGNOSIS — M19012 Primary osteoarthritis, left shoulder: Secondary | ICD-10-CM | POA: Diagnosis present

## 2018-01-10 DIAGNOSIS — R0682 Tachypnea, not elsewhere classified: Secondary | ICD-10-CM | POA: Diagnosis not present

## 2018-01-10 DIAGNOSIS — M19041 Primary osteoarthritis, right hand: Secondary | ICD-10-CM | POA: Diagnosis present

## 2018-01-10 DIAGNOSIS — E038 Other specified hypothyroidism: Secondary | ICD-10-CM | POA: Diagnosis not present

## 2018-01-10 DIAGNOSIS — Z9111 Patient's noncompliance with dietary regimen: Secondary | ICD-10-CM

## 2018-01-10 DIAGNOSIS — I5030 Unspecified diastolic (congestive) heart failure: Secondary | ICD-10-CM

## 2018-01-10 DIAGNOSIS — E662 Morbid (severe) obesity with alveolar hypoventilation: Secondary | ICD-10-CM | POA: Diagnosis present

## 2018-01-10 DIAGNOSIS — I10 Essential (primary) hypertension: Secondary | ICD-10-CM | POA: Diagnosis present

## 2018-01-10 DIAGNOSIS — E875 Hyperkalemia: Secondary | ICD-10-CM | POA: Diagnosis present

## 2018-01-10 DIAGNOSIS — M19011 Primary osteoarthritis, right shoulder: Secondary | ICD-10-CM | POA: Diagnosis present

## 2018-01-10 DIAGNOSIS — K219 Gastro-esophageal reflux disease without esophagitis: Secondary | ICD-10-CM | POA: Diagnosis not present

## 2018-01-10 DIAGNOSIS — Z79899 Other long term (current) drug therapy: Secondary | ICD-10-CM

## 2018-01-10 DIAGNOSIS — F419 Anxiety disorder, unspecified: Secondary | ICD-10-CM | POA: Diagnosis present

## 2018-01-10 DIAGNOSIS — R69 Illness, unspecified: Secondary | ICD-10-CM | POA: Diagnosis not present

## 2018-01-10 DIAGNOSIS — Z7952 Long term (current) use of systemic steroids: Secondary | ICD-10-CM

## 2018-01-10 LAB — CBC WITH DIFFERENTIAL/PLATELET
BASOS PCT: 0 %
Basophils Absolute: 0 10*3/uL (ref 0.0–0.1)
EOS ABS: 0.3 10*3/uL (ref 0.0–0.7)
Eosinophils Relative: 6 %
HEMATOCRIT: 38.2 % (ref 36.0–46.0)
HEMOGLOBIN: 11.7 g/dL — AB (ref 12.0–15.0)
Lymphocytes Relative: 18 %
Lymphs Abs: 1 10*3/uL (ref 0.7–4.0)
MCH: 26.1 pg (ref 26.0–34.0)
MCHC: 30.6 g/dL (ref 30.0–36.0)
MCV: 85.3 fL (ref 78.0–100.0)
MONOS PCT: 11 %
Monocytes Absolute: 0.6 10*3/uL (ref 0.1–1.0)
NEUTROS ABS: 3.6 10*3/uL (ref 1.7–7.7)
NEUTROS PCT: 65 %
Platelets: 194 10*3/uL (ref 150–400)
RBC: 4.48 MIL/uL (ref 3.87–5.11)
RDW: 18.8 % — ABNORMAL HIGH (ref 11.5–15.5)
WBC: 5.5 10*3/uL (ref 4.0–10.5)

## 2018-01-10 LAB — I-STAT CHEM 8, ED
BUN: 11 mg/dL (ref 6–20)
CHLORIDE: 100 mmol/L — AB (ref 101–111)
CREATININE: 0.7 mg/dL (ref 0.44–1.00)
Calcium, Ion: 1.03 mmol/L — ABNORMAL LOW (ref 1.15–1.40)
Glucose, Bld: 106 mg/dL — ABNORMAL HIGH (ref 65–99)
HEMATOCRIT: 38 % (ref 36.0–46.0)
Hemoglobin: 12.9 g/dL (ref 12.0–15.0)
POTASSIUM: 5.3 mmol/L — AB (ref 3.5–5.1)
SODIUM: 142 mmol/L (ref 135–145)
TCO2: 34 mmol/L — AB (ref 22–32)

## 2018-01-10 LAB — POTASSIUM: Potassium: 3.8 mmol/L (ref 3.5–5.1)

## 2018-01-10 LAB — TROPONIN I

## 2018-01-10 LAB — I-STAT TROPONIN, ED: Troponin i, poc: 0 ng/mL (ref 0.00–0.08)

## 2018-01-10 MED ORDER — FUROSEMIDE 10 MG/ML IJ SOLN
60.0000 mg | Freq: Two times a day (BID) | INTRAMUSCULAR | Status: DC
  Administered 2018-01-10 – 2018-01-13 (×6): 60 mg via INTRAVENOUS
  Filled 2018-01-10 (×7): qty 6

## 2018-01-10 MED ORDER — ONDANSETRON HCL 4 MG/2ML IJ SOLN: 4.0000 mg | Freq: Four times a day (QID) | INTRAMUSCULAR | Status: DC | PRN

## 2018-01-10 MED ORDER — FAMOTIDINE 20 MG PO TABS
40.0000 mg | ORAL_TABLET | Freq: Two times a day (BID) | ORAL | Status: DC
  Administered 2018-01-10 – 2018-01-17 (×14): 40 mg via ORAL
  Filled 2018-01-10 (×15): qty 2

## 2018-01-10 MED ORDER — LORATADINE 10 MG PO TABS
10.0000 mg | ORAL_TABLET | Freq: Every day | ORAL | Status: DC
  Administered 2018-01-10 – 2018-01-17 (×8): 10 mg via ORAL
  Filled 2018-01-10 (×8): qty 1

## 2018-01-10 MED ORDER — SODIUM CHLORIDE 0.9% FLUSH
3.0000 mL | Freq: Two times a day (BID) | INTRAVENOUS | Status: DC
  Administered 2018-01-10 – 2018-01-16 (×13): 3 mL via INTRAVENOUS

## 2018-01-10 MED ORDER — SODIUM CHLORIDE 0.9% FLUSH: 3.0000 mL | INTRAVENOUS | Status: DC | PRN

## 2018-01-10 MED ORDER — ALBUTEROL SULFATE HFA 108 (90 BASE) MCG/ACT IN AERS: 1.0000 | INHALATION_SPRAY | RESPIRATORY_TRACT | Status: DC | PRN

## 2018-01-10 MED ORDER — FUROSEMIDE 10 MG/ML IJ SOLN
40.0000 mg | Freq: Once | INTRAMUSCULAR | Status: AC
  Administered 2018-01-10: 40 mg via INTRAVENOUS
  Filled 2018-01-10: qty 4

## 2018-01-10 MED ORDER — VITAMIN B-12 1000 MCG PO TABS
1000.0000 ug | ORAL_TABLET | Freq: Every day | ORAL | Status: DC
  Administered 2018-01-10 – 2018-01-17 (×8): 1000 ug via ORAL
  Filled 2018-01-10 (×8): qty 1

## 2018-01-10 MED ORDER — LEVOTHYROXINE SODIUM 100 MCG PO TABS
100.0000 ug | ORAL_TABLET | Freq: Every day | ORAL | Status: DC
  Administered 2018-01-11 – 2018-01-17 (×7): 100 ug via ORAL
  Filled 2018-01-10 (×7): qty 1

## 2018-01-10 MED ORDER — SODIUM CHLORIDE 0.9 % IV SOLN: 250.0000 mL | INTRAVENOUS | Status: DC | PRN

## 2018-01-10 MED ORDER — ACETAMINOPHEN 325 MG PO TABS: 650.0000 mg | ORAL_TABLET | ORAL | Status: DC | PRN

## 2018-01-10 MED ORDER — METOPROLOL SUCCINATE ER 25 MG PO TB24
25.0000 mg | ORAL_TABLET | Freq: Every day | ORAL | Status: DC
  Administered 2018-01-10 – 2018-01-17 (×8): 25 mg via ORAL
  Filled 2018-01-10 (×8): qty 1

## 2018-01-10 MED ORDER — HYDROCODONE-ACETAMINOPHEN 10-325 MG PO TABS
1.0000 | ORAL_TABLET | Freq: Four times a day (QID) | ORAL | Status: DC | PRN
  Administered 2018-01-12 – 2018-01-16 (×3): 1 via ORAL
  Filled 2018-01-10 (×5): qty 1

## 2018-01-10 MED ORDER — PREDNISONE 20 MG PO TABS
40.0000 mg | ORAL_TABLET | Freq: Every day | ORAL | Status: AC
  Administered 2018-01-10 – 2018-01-12 (×3): 40 mg via ORAL
  Filled 2018-01-10 (×3): qty 2

## 2018-01-10 MED ORDER — ENOXAPARIN SODIUM 60 MG/0.6ML ~~LOC~~ SOLN
55.0000 mg | Freq: Two times a day (BID) | SUBCUTANEOUS | Status: DC
  Administered 2018-01-10 – 2018-01-17 (×13): 55 mg via SUBCUTANEOUS
  Filled 2018-01-10 (×14): qty 0.6

## 2018-01-10 MED ORDER — ALBUTEROL SULFATE (2.5 MG/3ML) 0.083% IN NEBU: 2.5000 mg | INHALATION_SOLUTION | RESPIRATORY_TRACT | Status: DC | PRN

## 2018-01-10 MED ORDER — LOSARTAN POTASSIUM 50 MG PO TABS
100.0000 mg | ORAL_TABLET | Freq: Every day | ORAL | Status: DC
  Administered 2018-01-10 – 2018-01-17 (×8): 100 mg via ORAL
  Filled 2018-01-10 (×8): qty 2

## 2018-01-10 MED ORDER — PREDNISONE 10 MG PO TABS
10.0000 mg | ORAL_TABLET | Freq: Every day | ORAL | Status: DC
  Administered 2018-01-13 – 2018-01-17 (×5): 10 mg via ORAL
  Filled 2018-01-10 (×6): qty 1

## 2018-01-10 MED ORDER — FOLIC ACID 1 MG PO TABS
1.0000 mg | ORAL_TABLET | Freq: Every day | ORAL | Status: DC
  Administered 2018-01-10 – 2018-01-17 (×8): 1 mg via ORAL
  Filled 2018-01-10 (×8): qty 1

## 2018-01-10 NOTE — ED Notes (Signed)
Pt ambulated with no oxygen to the restroom. While pt. Walked from the bed to the restroom pt. Oxygen level went from 99% to 79%. Pt was placed on 2 liters of oxygen while sitting down. Nurse aware.

## 2018-01-10 NOTE — ED Provider Notes (Signed)
[facility] DEPT Provider Note   CSN: [id number] Arrival date & time: 01/10/18  0505     History   Chief Complaint Chief Complaint  Patient presents with  . Shortness of Breath    HPI [name] is a 52 y.o. female.  HPI  Patient is a 52 year old female with morbid obesity, CHF with EF of 65% and history of COPD.  Patient uses 2 L of oxygen at home.  She reports that walking or how she is being a little more shortness of breath than usual.  She reports she been measuring her oxygen and is been dipping down into the high 70s.  But will then improve once she rests.    Past Medical History:  Diagnosis Date  . Anemia   . Arthritis    hands, shoulders, no meds  . CHF (congestive heart failure) (HCC)    EF60-65%  . Chronic hyperventilation syndrome    w/ obesity tx with albuterol inhaler and oxygen 2L  . COPD (chronic obstructive pulmonary disease) ([place])    uses oxygen 2 L  . Gallstones   . GERD (gastroesophageal reflux disease)    diet controlled - no meds  . H/O hiatal hernia   . Hypertension   . Hypothyroidism   . Kidney stones   . Morbid obesity ([place])   . Pneumonia    hoispitalized in 08/2011  . Sarcoidosis   . Seasonal allergies   . Shortness of breath    with exertion   . Sleep apnea    uses CPAP machine     Patient Active Problem List   Diagnosis Date Noted  . Unilateral primary osteoarthritis, left knee 02/20/2017  . Unilateral primary osteoarthritis, right knee 02/20/2017  . Chronic pain of right knee 02/20/2017  . COPD exacerbation ([place]) 02/12/2017  . GERD (gastroesophageal reflux disease) 02/12/2017  . Abnormal uterine bleeding 08/09/2016  . Diastolic dysfunction with acute on chronic heart failure ([place]) 06/17/2016  . Microcytic anemia 02/27/2016  . Abdominal pain 02/27/2016  . Chronic diastolic CHF (congestive heart failure) ([place]) 02/27/2016  . Hyperlipidemia 02/24/2016  . Chronic respiratory failure  ([place]) 09/21/2015  . Absolute anemia   . Anxiety 04/14/2015  . Acute on chronic diastolic CHF (congestive heart failure) ([place]) 01/18/2015  . Hypoxemia   . Congestive heart failure ([place])   . Symptomatic anemia 01/15/2015  . Hypothyroidism 01/15/2015  . Morbid obesity ([place]) 01/15/2015  . Fibroids 12/09/2013  . Hyperglycemia 09/16/2013  . Left knee pain 09/09/2012  . Sarcoidosis 09/09/2012  . Iron deficiency anemia due to chronic blood loss 09/09/2012  . Hypertension 02/25/2011  . Obesity hypoventilation syndrome ([place]) 02/14/2011  . OSA (obstructive sleep apnea) 02/10/2011    Past Surgical History:  Procedure Laterality Date  . [place]   x 1  . CHOLECYSTECTOMY  2000  . DILATION AND CURETTAGE OF UTERUS N/A 08/09/2016   Procedure: DILATATION AND CURETTAGE;  Surgeon: Everitt Amber, MD;  Location: [facility];  Service: Gynecology;  Laterality: N/A;  . HYSTEROSCOPY W/D&C  12/27/2011   Procedure: DILATATION AND CURETTAGE /HYSTEROSCOPY;  Surgeon: Maeola Sarah. Landry Mellow, MD;  Location: [facility];  Service: Gynecology;;  . I and D of abcess  05/2011  . INTRAUTERINE DEVICE (IUD) INSERTION N/A 08/09/2016   Procedure: INTRAUTERINE DEVICE (IUD) INSERTION;  Surgeon: Everitt Amber, MD;  Location: [facility];  Service: Gynecology;  Laterality: N/A;  . LUNG BIOPSY    . uterine abletion      OB  History    No data available       Home Medications    Prior to Admission medications   Medication Sig Start Date End Date Taking? Authorizing Provider  albuterol (PROAIR HFA) 108 (90 Base) MCG/ACT inhaler Inhale 1-2 puffs into the lungs every 6 hours as needed for wheezing. 01/08/18  Yes Rigoberto Noel, MD  cetirizine (ZYRTEC) 10 MG tablet Take 10 mg by mouth at bedtime as needed for allergies.   Yes [provider]  cholecalciferol (VITAMIN D) 400 UNITS TABS tablet Take 400 Units by mouth daily.   Yes [provider]  [place] Calcium 1000 (390 CA) MG TABS Take 1,000 mg by mouth daily.    Yes  [provider]  famotidine (PEPCID) 40 MG tablet Take 1 tablet (40 mg total) by mouth 2 (two) times daily. 02/16/17  Yes Barton Dubois, MD  folic acid (FOLVITE) 1 MG tablet Take 1 mg by mouth daily. 01/11/16  Yes [provider]  furosemide (LASIX) 20 MG tablet Take 3 tablets (60 mg total) by mouth 2 (two) times daily. 02/16/17  Yes Barton Dubois, MD  HYDROcodone-acetaminophen [facility]) 10-325 MG tablet Take 1 tablet by mouth every 6 (six) hours as needed (pain). 11/21/16  Yes Pete Pelt, PA-C  levothyroxine (SYNTHROID, LEVOTHROID) 100 MCG tablet TAKE 1 TABLET (100 MCG TOTAL) BY MOUTH DAILY AT 6 (SIX) AM. 09/27/17  Yes Nafziger, Tommi Rumps, NP  losartan (COZAAR) 100 MG tablet Take 1 tablet (100 mg total) daily by mouth. 09/13/17  Yes Nafziger, Tommi Rumps, NP  methotrexate 50 MG/2ML injection Inject 15 mg into the skin every Friday. Injects 0.71ml. 12/13/15  Yes [provider]  metoprolol succinate (TOPROL-XL) 25 MG 24 hr tablet TAKE 1 TABLET BY MOUTH EVERY DAY 11/22/17  Yes Nafziger, Tommi Rumps, NP  NON FORMULARY 2 liter of oxygen   Yes [provider]  predniSONE (DELTASONE) 10 MG tablet Take 6 tablets by mouth daily X 1 day; take 4 tablets daily X 2 days; take 2 tablets by mouth daily x 3 days; then resume 10 mg daily. Patient taking differently: Take 10 mg by mouth daily.  02/16/17  Yes Barton Dubois, MD  triamcinolone acetonide (KENALOG) 40 MG/ML injection Inject 40 mg into the muscle every 3 (three) months. For knee pain   Yes [provider]  triamcinolone ointment (KENALOG) 0.1 % Apply 1 application topically See admin instructions. Applies twice a day as needed for break out of Sarcoidosis   Yes [provider]  vitamin B-12 1000 MCG tablet Take 1 tablet (1,000 mcg total) by mouth daily. 06/19/16  Yes Lavina Hamman, MD    Family History Family History  Problem Relation Age of Onset  . Diabetes Father   . Cancer Father 25       colon cancer   .  Diabetes Brother   . Deep vein thrombosis Mother   . Aneurysm Sister        d/o brain aneurysm    Social History Social History   Tobacco Use  . Smoking status: Never Smoker  . Smokeless tobacco: Never Used  Substance Use Topics  . Alcohol use: Yes    Comment: occasionally  . Drug use: No     Allergies   Patient has no known allergies.   Review of Systems Review of Systems  Constitutional: Negative for activity change.  Respiratory: Positive for shortness of breath.   Cardiovascular: Negative for chest pain.  Gastrointestinal: Negative for abdominal pain.  All other systems reviewed and are negative.    Physical Exam Updated Vital Signs BP (!) 171/95   Pulse 81   Temp 97.7 F (36.5 C) (Oral)   Resp (!) 22   Wt (!) 229.1 kg (505 lb)   LMP 12/14/2017   SpO2 100%   BMI 81.51 kg/m   Physical Exam  Constitutional: She is oriented to person, place, and time. She appears well-developed and well-nourished.  Mrobidly obese  HENT:  Head: Normocephalic and atraumatic.  Eyes: Right eye exhibits no discharge.  Cardiovascular: Normal rate, regular rhythm and normal heart sounds.  No murmur heard. Pulmonary/Chest: Effort normal and breath sounds normal. No accessory muscle usage. No tachypnea. No respiratory distress. She has no decreased breath sounds. She has no wheezes. She has no rales.  Sating 100 percent on 2L  Abdominal: Soft. She exhibits no distension. There is no tenderness.  Musculoskeletal:  Bilateral LE with chronic skin changes  Neurological: She is oriented to person, place, and time.  Skin: Skin is warm and dry. She is not diaphoretic.  Psychiatric: She has a normal mood and affect.  Nursing note and vitals reviewed.    ED Treatments / Results  Labs (all labs ordered are listed, but only abnormal results are displayed) Labs Reviewed  CBC WITH DIFFERENTIAL/PLATELET - Abnormal; Notable for the following components:      Result Value   Hemoglobin  11.7 (*)    RDW 18.8 (*)    All other components within normal limits  I-STAT CHEM 8, ED - Abnormal; Notable for the following components:   Potassium 5.3 (*)    Chloride 100 (*)    Glucose, Bld 106 (*)    Calcium, Ion 1.03 (*)    TCO2 34 (*)    All other components within normal limits  I-STAT TROPONIN, ED    EKG  EKG Interpretation  Date/Time:  Wednesday January 10 2018 05:09:52 EDT Ventricular Rate:  78 PR Interval:    QRS Duration: 94 QT Interval:  403 QTC Calculation: 459 R Axis:   84 Text Interpretation:  Sinus rhythm Confirmed by Randal Buba, April ([id number]) on 01/10/2018 5:27:09 AM Also confirmed by Randal Buba, April ([id number]), editor Philomena Doheny [phone])  on 01/10/2018 8:10:35 AM       Radiology Dg Chest 2 View  Result Date: 01/10/2018 CLINICAL DATA:  Chronic worsening shortness of breath. Personal history of sarcoidosis. EXAM: CHEST - 2 VIEW COMPARISON:  CT of the chest performed 02/14/2017, and chest radiograph performed 02/11/2017 FINDINGS: The lungs are well-aerated. Vascular congestion is noted. Relatively stable increased interstitial markings are again noted. There is no evidence of pleural effusion or pneumothorax. The heart is borderline normal in size. No acute osseous abnormalities are seen. IMPRESSION: Vascular congestion; relatively stable increased interstitial markings noted. Electronically Signed   By: Garald Balding M.D.   On: 01/10/2018 06:19    Procedures Procedures (including critical care time)  Medications Ordered in ED Medications  furosemide (LASIX) injection 40 mg (40 mg Intravenous Given 01/10/18 0823)     Initial Impression / Assessment and Plan / ED Course  I have reviewed the triage vital signs and the nursing notes.  Pertinent labs & imaging results that were available during my care of the patient were reviewed by me and considered in my medical decision making (see chart for details).     Patient is a 52 year old female with morbid obesity,  CHF with EF of 65% and history of COPD.  Patient uses 2 L  of oxygen at home.  She reports that walking or how she is being a little more shortness of breath than usual.  She reports she been measuring her oxygen and is been dipping down into the high 70s.  But will then improve once she rests.  10:44 AM Patient's chest x-ray shows mild increased vasculature.  Likely patient has mild amount of fluid overload.  We discussed how she can just increase her Lasix at home, we can give her a dose here.  Patient states "I usually like to get admitted when we have to do this".  I am unsure the patient really needs inpatient requirements at this time  .  We will ambulate patient to see if her oxygenation does indeed drop while using her home oxygen.  Otherwise given that she is only mildly fluid overloaded, is at her baseline while resting.  Patient has reassuring labs.  Patient has no tachypnea at rest.  10:44 AM Patient ambulatted.  The patient's oxygenation stop dropped down to 88%.  Had lengthy discussion with patient about risk and benefits of inpatient admission versus discharge.  The patient likely could be discharged home.  However patient reports that she is very symptomatic with walking and completely ADLs.  The patient is dyspneic, short of breath walking short distances.  Is unclear to me how much of this is her baseline versus off her baseline.  Patient reports this is very different than usual.  Given Lasix here and she does report that she is "only peed a little bit".  And still feels very symptomatic.  Will discuss with hospitalist. Final Clinical Impressions(s) / ED Diagnoses   Final diagnoses:  None    ED Discharge Orders    None       Mete Purdum, Fredia Sorrow, MD 01/10/18 1421

## 2018-01-10 NOTE — ED Notes (Signed)
Pt has been given a lunch tray at this time.

## 2018-01-10 NOTE — ED Notes (Addendum)
Pt back from X-ray.  

## 2018-01-10 NOTE — ED Notes (Signed)
ED TO INPATIENT HANDOFF REPORT  Name/Age/Gender [name] 52 y.o. female  Code Status Code Status History    Date Active Date Inactive Code Status Order ID Comments User Context   02/12/2017 01:39 02/16/2017 21:16 Full Code [id number]  Reubin Milan, MD Inpatient   06/16/2016 12:21 06/19/2016 14:24 Full Code [id number]  Jani Gravel, MD Inpatient   02/27/2016 04:52 02/28/2016 18:48 Full Code [id number]  Vianne Bulls, MD ED   07/08/2015 15:55 07/11/2015 17:10 Full Code [id number]  Modena Jansky, MD Inpatient   01/15/2015 17:09 01/18/2015 15:50 Full Code [id number]  Theressa Millard, MD Inpatient   09/09/2012 16:34 09/10/2012 12:48 Full Code [id number]  Angus Palms, RN Inpatient    Advance Directive Documentation     Most Recent Value  Type of Advance Directive  Healthcare Power of Attorney, Living will  Pre-existing out of facility DNR order (yellow form or pink MOST form)  No data  "MOST" Form in Place?  No data      Home/SNF/Other Home  Chief Complaint Shortness of Breath  Level of Care/Admitting Diagnosis ED Disposition    ED Disposition Condition Comment   Admit  Hospital Area: [facility] [[id number]]  Level of Care: Telemetry [5]  Admit to tele based on following criteria: Acute CHF  Diagnosis: CHF (congestive heart failure) ([place]) [539767]  Admitting Physician: Hosie Poisson [[id number]]  Attending Physician: Hosie Poisson [[id number]]  PT Class (Do Not Modify): Observation [104]  PT Acc Code (Do Not Modify): Observation [10022]       Medical History Past Medical History:  Diagnosis Date  . Anemia   . Arthritis    hands, shoulders, no meds  . CHF (congestive heart failure) (HCC)    EF60-65%  . Chronic hyperventilation syndrome    w/ obesity tx with albuterol inhaler and oxygen 2L  . COPD (chronic obstructive pulmonary disease) ([place])    uses oxygen 2 L  . Gallstones   . GERD (gastroesophageal reflux disease)    diet controlled - no  meds  . H/O hiatal hernia   . Hypertension   . Hypothyroidism   . Kidney stones   . Morbid obesity ([place])   . Pneumonia    hoispitalized in 08/2011  . Sarcoidosis   . Seasonal allergies   . Shortness of breath    with exertion   . Sleep apnea    uses CPAP machine     Allergies No Known Allergies  IV Location/Drains/Wounds Patient Lines/Drains/Airways Status   Active Line/Drains/Airways    Name:   Placement date:   Placement time:   Site:   Days:   Peripheral IV 01/10/18 Anterior;Distal;Left Arm   01/10/18    0625    Arm   less than 1   Incision (Closed) 08/09/16 Perineum Other (Comment)   08/09/16    1001     519          Labs/Imaging Results for orders placed or performed during the hospital encounter of 01/10/18 (from the past 48 hour(s))  CBC with Differential/Platelet     Status: Abnormal   Collection Time: 01/10/18  6:32 AM  Result Value Ref Range   WBC 5.5 4.0 - 10.5 K/uL   RBC 4.48 3.87 - 5.11 MIL/uL   Hemoglobin 11.7 (L) 12.0 - 15.0 g/dL   HCT 38.2 36.0 - 46.0 %   MCV 85.3 78.0 - 100.0 fL   MCH 26.1 26.0 - 34.0 pg   MCHC 30.6 30.0 -  36.0 g/dL   RDW 18.8 (H) 11.5 - 15.5 %   Platelets 194 150 - 400 K/uL   Neutrophils Relative % 65 %   Neutro Abs 3.6 1.7 - 7.7 K/uL   Lymphocytes Relative 18 %   Lymphs Abs 1.0 0.7 - 4.0 K/uL   Monocytes Relative 11 %   Monocytes Absolute 0.6 0.1 - 1.0 K/uL   Eosinophils Relative 6 %   Eosinophils Absolute 0.3 0.0 - 0.7 K/uL   Basophils Relative 0 %   Basophils Absolute 0.0 0.0 - 0.1 K/uL    Comment: Performed at [facility], [place] [address], [place], [place] [address]  I-stat troponin, ED     Status: None   Collection Time: 01/10/18  6:37 AM  Result Value Ref Range   Troponin i, poc 0.00 0.00 - 0.08 ng/mL   Comment 3            Comment: Due to the release kinetics of cTnI, a negative result within the first hours of the onset of symptoms does not rule out myocardial infarction with certainty. If  myocardial infarction is still suspected, repeat the test at appropriate intervals.   I-Stat Chem 8, ED     Status: Abnormal   Collection Time: 01/10/18  6:39 AM  Result Value Ref Range   Sodium 142 135 - 145 mmol/L   Potassium 5.3 (H) 3.5 - 5.1 mmol/L   Chloride 100 (L) 101 - 111 mmol/L   BUN 11 6 - 20 mg/dL   Creatinine, Ser 0.70 0.44 - 1.00 mg/dL   Glucose, Bld 106 (H) 65 - 99 mg/dL   Calcium, Ion 1.03 (L) 1.15 - 1.40 mmol/L   TCO2 34 (H) 22 - 32 mmol/L   Hemoglobin 12.9 12.0 - 15.0 g/dL   HCT 38.0 36.0 - 46.0 %  Troponin I     Status: None   Collection Time: 01/10/18 12:57 PM  Result Value Ref Range   Troponin I <0.03 <0.03 ng/mL    Comment: Performed at [facility], [place] [address], [place], [place] [address]   Dg Chest 2 View  Result Date: 01/10/2018 CLINICAL DATA:  Chronic worsening shortness of breath. Personal history of sarcoidosis. EXAM: CHEST - 2 VIEW COMPARISON:  CT of the chest performed 02/14/2017, and chest radiograph performed 02/11/2017 FINDINGS: The lungs are well-aerated. Vascular congestion is noted. Relatively stable increased interstitial markings are again noted. There is no evidence of pleural effusion or pneumothorax. The heart is borderline normal in size. No acute osseous abnormalities are seen. IMPRESSION: Vascular congestion; relatively stable increased interstitial markings noted. Electronically Signed   By: Garald Balding M.D.   On: 01/10/2018 06:19    Pending Labs Unresulted Labs (From admission, onward)   Start     Ordered   01/10/18 1900  Potassium  Once,   R     01/10/18 1246   01/10/18 1247  Troponin I  Now then every 6 hours,   R     01/10/18 1246   Signed and Held  Basic metabolic panel  Daily,   R     Signed and Held   Signed and Held  Creatinine, serum  (enoxaparin (LOVENOX)    CrCl >/= 30 ml/min)  Weekly,   R    Comments:  while on enoxaparin therapy    Signed and Held      Vitals/Pain Today's Vitals   01/10/18  1231 01/10/18 1500 01/10/18 1505 01/10/18 1530  BP: (!) 171/80 Marland Kitchen)  149/72 (!) 149/72 (!) 159/76  Pulse: 90 84 81 84  Resp: 18 (!) 21 20 (!) 21  Temp:      TempSrc:      SpO2: 97% 100% 100% 100%  Weight:        Isolation Precautions No active isolations  Medications Medications  furosemide (LASIX) injection 60 mg (not administered)  predniSONE (DELTASONE) tablet 40 mg (40 mg Oral Given 01/10/18 1449)  furosemide (LASIX) injection 40 mg (40 mg Intravenous Given 01/10/18 0823)    Mobility walks

## 2018-01-10 NOTE — Progress Notes (Signed)
PHARMACY CONSULT: Lovenox for VTE prophylaxis   Wt: 214kg BMI:  78.6 Scr:  0.7 CrCl >30 ml/hr  H/H: 12.9/38 Pltc: 194  A/P:  Due to morbid obesity, begin weight-adjusted lovenox 0.5mg /kg sq q24h (110mg  q24h but will divide into 55mg  q12h) Monitor CBC and renal function, adjust as needed  Peggyann Juba, PharmD, BCPS Pager: [phone] 01/10/2018 4:57 PM

## 2018-01-10 NOTE — H&P (Signed)
History and Physical    [name] WRU:[id number] DOB: 01/10/1966 DOA: 01/10/2018  PCP: [name], NP  Patient coming from: [facility].   I have personally briefly reviewed patient's old medical records in [place]  Chief Complaint: sob for 3 weeks.   HPI: [name] is a 52 y.o. female with medical history significant of anemia, diastolic heart failure , chronic respiratory failure on 2 lit [place], OHS, OSA, on CPAP, morbid obesity, GERD, hypertension, hypothyroidism comes in for worsening sob for the last 3 weeks, associated with dry cough, subjective fevers, chills and hypoxia on ambulation and worsening leg edema. She reports some chest discomfort, non radiating and associated with ambulation. Some nausea today, no vomiting, . She reports some loose bowel movements two days ago, which has resolved. No hematochezia, no hemoptysis, no syncope, no headache or dizziness, no sensory deficits. On arrival to ED, she was tachypneic, hypoxic with sats at 88% on 2 lit on ambulation. Pt reports on ambulation at home, her sats have dropped to 70%. She also reports non compliance to fluid and diet and reports missing her prednisone and lasix over the last 4 weeks. Her lab work revealed mild hyperkalemia , normal wbc count and baseline creatinine. poc troponin is negative. CXR shows some interstitial edema.  EKG does not show any ischemic changes. She was referred to medical service for admission for possible worsening heart failure.    Review of Systems: As per HPI otherwise 10 point review of systems negative.    Past Medical History:  Diagnosis Date  . Anemia   . Arthritis    hands, shoulders, no meds  . CHF (congestive heart failure) (HCC)    EF60-65%  . Chronic hyperventilation syndrome    w/ obesity tx with albuterol inhaler and oxygen 2L  . COPD (chronic obstructive pulmonary disease) ([place])    uses oxygen 2 L  . Gallstones   . GERD (gastroesophageal reflux disease)     diet controlled - no meds  . H/O hiatal hernia   . Hypertension   . Hypothyroidism   . Kidney stones   . Morbid obesity ([place])   . Pneumonia    hoispitalized in 08/2011  . Sarcoidosis   . Seasonal allergies   . Shortness of breath    with exertion   . Sleep apnea    uses CPAP machine     Past Surgical History:  Procedure Laterality Date  . [place]   x 1  . CHOLECYSTECTOMY  2000  . DILATION AND CURETTAGE OF UTERUS N/A 08/09/2016   Procedure: DILATATION AND CURETTAGE;  Surgeon: Everitt Amber, MD;  Location: [facility];  Service: Gynecology;  Laterality: N/A;  . HYSTEROSCOPY W/D&C  12/27/2011   Procedure: DILATATION AND CURETTAGE /HYSTEROSCOPY;  Surgeon: Maeola Sarah. Landry Mellow, MD;  Location: [facility];  Service: Gynecology;;  . I and D of abcess  05/2011  . INTRAUTERINE DEVICE (IUD) INSERTION N/A 08/09/2016   Procedure: INTRAUTERINE DEVICE (IUD) INSERTION;  Surgeon: Everitt Amber, MD;  Location: [facility];  Service: Gynecology;  Laterality: N/A;  . LUNG BIOPSY    . uterine abletion       reports that  has never smoked. she has never used smokeless tobacco. She reports that she drinks alcohol. She reports that she does not use drugs.  No Known Allergies  Family History  Problem Relation Age of Onset  . Diabetes Father   . Cancer Father 59  colon cancer   . Diabetes Brother   . Deep vein thrombosis Mother   . Aneurysm Sister        d/o brain aneurysm   Family history reviewed and not pertinent.   Prior to Admission medications   Medication Sig Start Date End Date Taking? Authorizing Provider  albuterol (PROAIR HFA) 108 (90 Base) MCG/ACT inhaler Inhale 1-2 puffs into the lungs every 6 hours as needed for wheezing. 01/08/18  Yes Rigoberto Noel, MD  cetirizine (ZYRTEC) 10 MG tablet Take 10 mg by mouth at bedtime as needed for allergies.   Yes [provider]  cholecalciferol (VITAMIN D) 400 UNITS TABS tablet Take 400 Units by mouth daily.   Yes [provider]   Coral Calcium 1000 (390 CA) MG TABS Take 1,000 mg by mouth daily.    Yes [provider]  famotidine (PEPCID) 40 MG tablet Take 1 tablet (40 mg total) by mouth 2 (two) times daily. 02/16/17  Yes Barton Dubois, MD  folic acid (FOLVITE) 1 MG tablet Take 1 mg by mouth daily. 01/11/16  Yes [provider]  furosemide (LASIX) 20 MG tablet Take 3 tablets (60 mg total) by mouth 2 (two) times daily. 02/16/17  Yes Barton Dubois, MD  HYDROcodone-acetaminophen [facility]) 10-325 MG tablet Take 1 tablet by mouth every 6 (six) hours as needed (pain). 11/21/16  Yes Pete Pelt, PA-C  levothyroxine (SYNTHROID, LEVOTHROID) 100 MCG tablet TAKE 1 TABLET (100 MCG TOTAL) BY MOUTH DAILY AT 6 (SIX) AM. 09/27/17  Yes Nafziger, Tommi Rumps, NP  losartan (COZAAR) 100 MG tablet Take 1 tablet (100 mg total) daily by mouth. 09/13/17  Yes Nafziger, Tommi Rumps, NP  methotrexate 50 MG/2ML injection Inject 15 mg into the skin every Friday. Injects 0.52ml. 12/13/15  Yes [provider]  metoprolol succinate (TOPROL-XL) 25 MG 24 hr tablet TAKE 1 TABLET BY MOUTH EVERY DAY 11/22/17  Yes Nafziger, Tommi Rumps, NP  NON FORMULARY 2 liter of oxygen   Yes [provider]  predniSONE (DELTASONE) 10 MG tablet Take 6 tablets by mouth daily X 1 day; take 4 tablets daily X 2 days; take 2 tablets by mouth daily x 3 days; then resume 10 mg daily. Patient taking differently: Take 10 mg by mouth daily.  02/16/17  Yes Barton Dubois, MD  triamcinolone acetonide (KENALOG) 40 MG/ML injection Inject 40 mg into the muscle every 3 (three) months. For knee pain   Yes [provider]  triamcinolone ointment (KENALOG) 0.1 % Apply 1 application topically See admin instructions. Applies twice a day as needed for break out of Sarcoidosis   Yes [provider]  vitamin B-12 1000 MCG tablet Take 1 tablet (1,000 mcg total) by mouth daily. 06/19/16  Yes Lavina Hamman, MD    Physical Exam: Vitals:   01/10/18 0722 01/10/18 0730  01/10/18 1215 01/10/18 1216  BP: (!) 161/86 (!) 171/95 (!) 149/94 (!) 149/94  Pulse: 84 81 85 88  Resp:    20  Temp:      TempSrc:      SpO2: 100% 100% 100% 99%  Weight:        Constitutional: morbidly obese, in mild distress from sob.  Vitals:   01/10/18 0722 01/10/18 0730 01/10/18 1215 01/10/18 1216  BP: (!) 161/86 (!) 171/95 (!) 149/94 (!) 149/94  Pulse: 84 81 85 88  Resp:    20  Temp:      TempSrc:      SpO2: 100% 100% 100% 99%  Weight:       Eyes: PERRL, lids and conjunctivae normal ENMT: Mucous membranes are moist. Posterior pharynx clear of any exudate or lesions.Normal dentition.  Neck: normal, supple, no masses, no thyromegaly Respiratory: clear to auscultation bilaterally, no wheezing, no crackles. Normal respiratory effort. No accessory muscle use.  Cardiovascular: Regular rate and rhythm, no murmurs / rubs / gallops. No extremity edema Abdomen: no tenderness, no masses palpated. No hepatosplenomegaly. Bowel sounds positive.  Musculoskeletal: lymphedema legs bilateral. Chronic. Pedal pulses cannot be appreciated due to body  Habitus.  Skin: no rashes, lesions, ulcers. No induration Neurologic: CN 2-12 grossly intact. Sensation intact, DTR normal. Strength 5/5 in all 4.  Psychiatric: anxious looking,  Alert and oriented x 3. Normal mood.    Labs on Admission: I have personally reviewed following labs and imaging studies  CBC: Recent Labs  Lab 01/10/18 0632 01/10/18 0639  WBC 5.5  --   NEUTROABS 3.6  --   HGB 11.7* 12.9  HCT 38.2 38.0  MCV 85.3  --   PLT 194  --    Basic Metabolic Panel: Recent Labs  Lab 01/10/18 0639  NA 142  K 5.3*  CL 100*  GLUCOSE 106*  BUN 11  CREATININE 0.70   GFR: Estimated Creatinine Clearance: 165.2 mL/min (by C-G formula based on SCr of 0.7 mg/dL). Liver Function Tests: No results for input(s): AST, ALT, ALKPHOS, BILITOT, PROT, ALBUMIN in the last 168 hours. No results for input(s): LIPASE, AMYLASE in the last 168  hours. No results for input(s): AMMONIA in the last 168 hours. Coagulation Profile: No results for input(s): INR, PROTIME in the last 168 hours. Cardiac Enzymes: No results for input(s): CKTOTAL, CKMB, CKMBINDEX, TROPONINI in the last 168 hours. BNP (last 3 results) No results for input(s): PROBNP in the last 8760 hours. HbA1C: No results for input(s): HGBA1C in the last 72 hours. CBG: No results for input(s): GLUCAP in the last 168 hours. Lipid Profile: No results for input(s): CHOL, HDL, LDLCALC, TRIG, CHOLHDL, LDLDIRECT in the last 72 hours. Thyroid Function Tests: No results for input(s): TSH, T4TOTAL, FREET4, T3FREE, THYROIDAB in the last 72 hours. Anemia Panel: No results for input(s): VITAMINB12, FOLATE, FERRITIN, TIBC, IRON, RETICCTPCT in the last 72 hours. Urine analysis:    Component Value Date/Time   COLORURINE YELLOW 06/16/2016 0100   APPEARANCEUR CLOUDY (A) 06/16/2016 0100   LABSPEC 1.021 06/16/2016 0100   PHURINE 6.0 06/16/2016 0100   GLUCOSEU NEGATIVE 06/16/2016 0100   HGBUR LARGE (A) 06/16/2016 0100   BILIRUBINUR NEGATIVE 06/16/2016 0100   KETONESUR NEGATIVE 06/16/2016 0100   PROTEINUR NEGATIVE 06/16/2016 0100   UROBILINOGEN 0.2 09/10/2012 0106   NITRITE NEGATIVE 06/16/2016 0100   LEUKOCYTESUR NEGATIVE 06/16/2016 0100    Radiological Exams on Admission: Dg Chest 2 View  Result Date: 01/10/2018 CLINICAL DATA:  Chronic worsening shortness of breath. Personal history of sarcoidosis. EXAM: CHEST - 2 VIEW COMPARISON:  CT of the chest performed 02/14/2017, and chest radiograph performed 02/11/2017 FINDINGS: The lungs are well-aerated. Vascular congestion is noted. Relatively stable increased interstitial markings are again noted. There is no evidence of pleural effusion or pneumothorax. The heart is borderline normal in size. No acute osseous abnormalities are seen. IMPRESSION: Vascular congestion; relatively stable increased interstitial markings noted. Electronically  Signed   By: Garald Balding M.D.   On: 01/10/2018 06:19    EKG: Independently reviewed. Sinus rhythm.   Assessment/Plan Active Problems:   OSA (obstructive sleep apnea)   Obesity hypoventilation syndrome ([place])  Hypertension   Sarcoidosis   Iron deficiency anemia due to chronic blood loss   Hypothyroidism   Acute on chronic diastolic CHF (congestive heart failure) (HCC)   Congestive heart failure (HCC)   Anxiety   Chronic respiratory failure (HCC)   Diastolic dysfunction with acute on chronic heart failure (HCC)   GERD (gastroesophageal reflux disease)   CHF (congestive heart failure) (HCC)  Acute on chronic respiratory failure with hypoxia secondary to acute on chronic diastolic heart failure.  Admit to telemetry. Start the patient on IV lasix 60 mg BID.  Resume rest of her home meds.  Echocardiogram to evaluate for worsening of diastolic function vs worsening of pulm hypertension.  Daily weights, and strict intake and output.  Counseling on compliance to meds.  Serial troponins. If echo is abn, please call cardiology for recommendations.     OSA on  CPAP at night, suspect non compliance .    Chronic respiratory failure on 2l it of [place] oxygen sec to OHS, OSA, sarcoidosis Follow up with Dr Elsworth Soho as outpatient.  Will increase her prednisone from 10 mg daily to 40 mg daily and taper quickly over the next week.     Hypothyroidism: Resume synthroid.    Hypertension:  On arrival, bp were high, better now, resume home meds and hydralazine prn added on.    Anxiety.  Prn ativan ordered.    Hyperkalemia:  Mild and asymptomatic.  Recheck potassium tonight, was given lasix earlier.     GERD: Stable.     DVT prophylaxis: lovenox.  Code Status: full code.  Family Communication: none at bedside.  Disposition Plan: pending resolution of heart failure.  Consults called: none.  Admission status: obs /tele.    Hosie Poisson MD [facility] Pager [phone]     If 7PM-7AM, please contact night-coverage [email or web address] Password [facility]  01/10/2018, 12:32 PM

## 2018-01-10 NOTE — ED Notes (Signed)
Pt. Ambulated with no assist out into the hallway with 2 liters of oxygen. While pt. walked out to the hallway the O2 was at 98%. Pt. Returned back to the room with the O2 level at 88%. Nurse aware.

## 2018-01-10 NOTE — ED Triage Notes (Signed)
Pt BIB EMS from home. Patient complains of exertional shortness of breath, otherwise patient is at her baseline. Patient on 2L [place] at home. Patient does not complain of worsening shortness of breath at rest, chest pain, or any other complaints.

## 2018-01-10 NOTE — ED Notes (Signed)
Bed: WA17 Expected date:  Expected time:  Means of arrival:  Comments: EMS 

## 2018-01-11 ENCOUNTER — Observation Stay (HOSPITAL_COMMUNITY): Payer: Medicare HMO

## 2018-01-11 DIAGNOSIS — G4733 Obstructive sleep apnea (adult) (pediatric): Secondary | ICD-10-CM | POA: Diagnosis not present

## 2018-01-11 DIAGNOSIS — I472 Ventricular tachycardia: Secondary | ICD-10-CM | POA: Diagnosis not present

## 2018-01-11 DIAGNOSIS — E875 Hyperkalemia: Secondary | ICD-10-CM | POA: Diagnosis not present

## 2018-01-11 DIAGNOSIS — I5030 Unspecified diastolic (congestive) heart failure: Secondary | ICD-10-CM

## 2018-01-11 DIAGNOSIS — M19012 Primary osteoarthritis, left shoulder: Secondary | ICD-10-CM | POA: Diagnosis present

## 2018-01-11 DIAGNOSIS — R69 Illness, unspecified: Secondary | ICD-10-CM | POA: Diagnosis not present

## 2018-01-11 DIAGNOSIS — E038 Other specified hypothyroidism: Secondary | ICD-10-CM | POA: Diagnosis not present

## 2018-01-11 DIAGNOSIS — D500 Iron deficiency anemia secondary to blood loss (chronic): Secondary | ICD-10-CM | POA: Diagnosis not present

## 2018-01-11 DIAGNOSIS — E039 Hypothyroidism, unspecified: Secondary | ICD-10-CM | POA: Diagnosis present

## 2018-01-11 DIAGNOSIS — M19042 Primary osteoarthritis, left hand: Secondary | ICD-10-CM | POA: Diagnosis present

## 2018-01-11 DIAGNOSIS — J90 Pleural effusion, not elsewhere classified: Secondary | ICD-10-CM | POA: Diagnosis not present

## 2018-01-11 DIAGNOSIS — D860 Sarcoidosis of lung: Secondary | ICD-10-CM | POA: Diagnosis present

## 2018-01-11 DIAGNOSIS — J9611 Chronic respiratory failure with hypoxia: Secondary | ICD-10-CM | POA: Diagnosis not present

## 2018-01-11 DIAGNOSIS — I509 Heart failure, unspecified: Secondary | ICD-10-CM | POA: Diagnosis not present

## 2018-01-11 DIAGNOSIS — E662 Morbid (severe) obesity with alveolar hypoventilation: Secondary | ICD-10-CM | POA: Diagnosis not present

## 2018-01-11 DIAGNOSIS — K219 Gastro-esophageal reflux disease without esophagitis: Secondary | ICD-10-CM | POA: Diagnosis not present

## 2018-01-11 DIAGNOSIS — D899 Disorder involving the immune mechanism, unspecified: Secondary | ICD-10-CM | POA: Diagnosis not present

## 2018-01-11 DIAGNOSIS — E876 Hypokalemia: Secondary | ICD-10-CM | POA: Diagnosis not present

## 2018-01-11 DIAGNOSIS — T380X6A Underdosing of glucocorticoids and synthetic analogues, initial encounter: Secondary | ICD-10-CM | POA: Diagnosis present

## 2018-01-11 DIAGNOSIS — Z6841 Body Mass Index (BMI) 40.0 and over, adult: Secondary | ICD-10-CM | POA: Diagnosis not present

## 2018-01-11 DIAGNOSIS — I110 Hypertensive heart disease with heart failure: Secondary | ICD-10-CM | POA: Diagnosis not present

## 2018-01-11 DIAGNOSIS — Z9981 Dependence on supplemental oxygen: Secondary | ICD-10-CM | POA: Diagnosis not present

## 2018-01-11 DIAGNOSIS — D649 Anemia, unspecified: Secondary | ICD-10-CM | POA: Diagnosis present

## 2018-01-11 DIAGNOSIS — F419 Anxiety disorder, unspecified: Secondary | ICD-10-CM | POA: Diagnosis present

## 2018-01-11 DIAGNOSIS — I498 Other specified cardiac arrhythmias: Secondary | ICD-10-CM | POA: Diagnosis present

## 2018-01-11 DIAGNOSIS — J9621 Acute and chronic respiratory failure with hypoxia: Secondary | ICD-10-CM | POA: Diagnosis not present

## 2018-01-11 DIAGNOSIS — T501X6A Underdosing of loop [high-ceiling] diuretics, initial encounter: Secondary | ICD-10-CM | POA: Diagnosis present

## 2018-01-11 DIAGNOSIS — J449 Chronic obstructive pulmonary disease, unspecified: Secondary | ICD-10-CM | POA: Diagnosis present

## 2018-01-11 DIAGNOSIS — I10 Essential (primary) hypertension: Secondary | ICD-10-CM | POA: Diagnosis not present

## 2018-01-11 DIAGNOSIS — R0602 Shortness of breath: Secondary | ICD-10-CM | POA: Diagnosis not present

## 2018-01-11 DIAGNOSIS — D869 Sarcoidosis, unspecified: Secondary | ICD-10-CM | POA: Diagnosis not present

## 2018-01-11 DIAGNOSIS — M19041 Primary osteoarthritis, right hand: Secondary | ICD-10-CM | POA: Diagnosis present

## 2018-01-11 DIAGNOSIS — I5033 Acute on chronic diastolic (congestive) heart failure: Secondary | ICD-10-CM | POA: Diagnosis not present

## 2018-01-11 LAB — CBC WITH DIFFERENTIAL/PLATELET
Basophils Absolute: 0 10*3/uL (ref 0.0–0.1)
Basophils Relative: 0 %
Eosinophils Absolute: 0 10*3/uL (ref 0.0–0.7)
Eosinophils Relative: 0 %
HEMATOCRIT: 39.5 % (ref 36.0–46.0)
HEMOGLOBIN: 12.2 g/dL (ref 12.0–15.0)
Lymphocytes Relative: 6 %
Lymphs Abs: 0.5 10*3/uL — ABNORMAL LOW (ref 0.7–4.0)
MCH: 26 pg (ref 26.0–34.0)
MCHC: 30.9 g/dL (ref 30.0–36.0)
MCV: 84 fL (ref 78.0–100.0)
MONOS PCT: 4 %
Monocytes Absolute: 0.4 10*3/uL (ref 0.1–1.0)
NEUTROS ABS: 8 10*3/uL — AB (ref 1.7–7.7)
NEUTROS PCT: 90 %
Platelets: 217 10*3/uL (ref 150–400)
RBC: 4.7 MIL/uL (ref 3.87–5.11)
RDW: 18.5 % — ABNORMAL HIGH (ref 11.5–15.5)
WBC: 8.9 10*3/uL (ref 4.0–10.5)

## 2018-01-11 LAB — BASIC METABOLIC PANEL
ANION GAP: 11 (ref 5–15)
BUN: 13 mg/dL (ref 6–20)
CHLORIDE: 103 mmol/L (ref 101–111)
CO2: 29 mmol/L (ref 22–32)
Calcium: 9.1 mg/dL (ref 8.9–10.3)
Creatinine, Ser: 0.83 mg/dL (ref 0.44–1.00)
GFR calc non Af Amer: >60 mL/min (ref >60)
Glucose, Bld: 139 mg/dL — ABNORMAL HIGH (ref 65–99)
POTASSIUM: 3.8 mmol/L (ref 3.5–5.1)
SODIUM: 143 mmol/L (ref 135–145)

## 2018-01-11 LAB — PHOSPHORUS: Phosphorus: 2.8 mg/dL (ref 2.5–4.6)

## 2018-01-11 LAB — ECHOCARDIOGRAM COMPLETE
Height: 65 in
WEIGHTICAEL: 7813.1 [oz_av]

## 2018-01-11 LAB — TROPONIN I: Troponin I: <0.03 ng/mL (ref <0.03)

## 2018-01-11 LAB — MAGNESIUM: MAGNESIUM: 1.4 mg/dL — AB (ref 1.7–2.4)

## 2018-01-11 MED ORDER — PERFLUTREN LIPID MICROSPHERE
1.0000 mL | INTRAVENOUS | Status: AC | PRN
  Administered 2018-01-11: 3 mL via INTRAVENOUS
  Filled 2018-01-11: qty 10

## 2018-01-11 MED ORDER — HYDRALAZINE HCL 20 MG/ML IJ SOLN: 10.0000 mg | Freq: Four times a day (QID) | INTRAMUSCULAR | Status: DC | PRN

## 2018-01-11 MED ORDER — MAGNESIUM SULFATE 2 GM/50ML IV SOLN
2.0000 g | Freq: Once | INTRAVENOUS | Status: AC
  Administered 2018-01-11: 2 g via INTRAVENOUS
  Filled 2018-01-11: qty 50

## 2018-01-11 MED ORDER — PERFLUTREN LIPID MICROSPHERE
INTRAVENOUS | Status: AC
  Filled 2018-01-11: qty 10

## 2018-01-11 NOTE — Progress Notes (Signed)
PROGRESS NOTE    [name]  VVO:[id number] DOB: October 09, 1966 DOA: 01/10/2018 PCP: [name], NP   Brief Narrative:  PHELAN GOERS is a 52 y.o. female with medical history significant of anemia, diastolic heart failure , chronic respiratory failure on 2 lit [place], OHS, OSA on CPAP, Morbid Obesity, GERD, Hypertension, Hypothyroidism, Sarcoidosis who comes in for worsening Shortness of Breath for the last 3 weeks, associated with dry cough, subjective fevers, chills and hypoxia on ambulation and worsening leg edema.   She reported some chest discomfort, non radiating and associated with ambulation. Some nausea today, no vomiting, . She reported some loose bowel movements two days ago, which has resolved. No hematochezia, no hemoptysis, no syncope, no headache or dizziness, no sensory deficits.   On arrival to ED, she was tachypneic, hypoxic with sats at 88% on 2 Liters on ambulation. Pt reports on ambulation at home, her sats have dropped to 70%. She also reports non compliance to fluid and diet and reports missing her prednisone and lasix over the last 4 weeks. CXR showed some interstitial edema.  EKG does not show any ischemic changes. She was referred to medical service for admission for possible worsening heart failure.   Assessment & Plan:   Active Problems:   OSA (obstructive sleep apnea)   Obesity hypoventilation syndrome (HCC)   Hypertension   Sarcoidosis   Iron deficiency anemia due to chronic blood loss   Hypothyroidism   Acute on chronic diastolic CHF (congestive heart failure) (HCC)   Congestive heart failure (HCC)   Anxiety   Chronic respiratory failure (HCC)   Diastolic dysfunction with acute on chronic heart failure (HCC)   GERD (gastroesophageal reflux disease)   CHF (congestive heart failure) (HCC)  Acute on Chronic Respiratory Failure with Hypoxia 2/2 to Acute on Chronic Diastolic Heart Failure.  -Admitted to Telemetry.  -C/w IV lasix 60 mg BID.    -C/w Metoprolol XL 25 mg po Daily and Losartan 100 mg po Daily  -Echocardiogram done and showed The estimated ejection fraction was in the range of 55% to 60%. Wall motion was normal; there were no regional wall motion abnormalities. The PA Pressure was 35 -C/w Daily Weights, and Strict Intake and Output.  -C/w Heart Healthy Diet with 1500 mL Fluid Restriction -Troponin were <0.03 x3 -Patient is -647 mL; Weight is down from 505 to 488 lbs -Continue to Monitor Volume Status Carefully -Check Ambulatory Pulse Oximetry Screen in AM   OSA on CPAP at Night  -Suspect non compliance -C/w CPAP for Tonight.   Chronic Rrespiratory Failure on 2 Liters of [place] oxygen 2/2 to OHS, OSA, Sarcoidosis -Follows up with Dr Elsworth Soho as outpatient.  -Will increase her Prednisone from 10 mg daily to 40 mg daily and taper quickly over the next week.  -C/w Albuterol Neb 2.5 mg Neb q4hprn Wheezing or SOB -Will need Ambulatory Pulse Oximetry Screening and will do in AM   Hypothyroidism -C/w Levothyroxine 100 mcg po Daily   Hypertension -C/w Losartan 100 mg po Daily and Metoprolol XL 25 mg po Daily -Add Hydralazine 10 mg IV q6hprn for SBP >180 or DBP>100  Hyperkalemia, improved -Improved. K+ went from 5.3 -> 3.8 -Continue to Monitor and Repeat CMP in AM   GERD -Stable. -C/w Famotidine 40 mg po BID  Sarcoidosis -As Above -C/w Prednisone  Hypomagnesemia -Mag Level was 1.4 -Replete with IV Mag Sulfate 2 grams -Continue to Monitor and Replete as Necessary -Repeat Mag Level in AM  DVT prophylaxis: Enoxaparin  55 mg q12h  Code Status: FULL CODE Family Communication: No family present at bedside Disposition Plan: Remain Inpatient for continued Diuresis and anticipate D/C within 24-48 hours if improved.  Consultants:   None   Procedures:  ECHOCARDIOGRAM ------------------------------------------------------------------- Study Conclusions  - Left ventricle: The cavity size was normal. Wall  thickness was   increased in a pattern of mild LVH. Systolic function was normal.   The estimated ejection fraction was in the range of 55% to 60%.   Wall motion was normal; there were no regional wall motion   abnormalities. - Aortic valve: Trileaflet; mildly calcified leaflets. - Right ventricle: The cavity size was mildly dilated. - Pulmonary arteries: Systolic pressure was mildly increased. PA   peak pressure: 35 mm Hg (S). - Pericardium, extracardiac: A trivial pericardial effusion was   identified.  Antimicrobials:  Anti-infectives (From admission, onward)   None     Subjective: Seen and examined at bedside and felt ok. States she hasn't been the most compliant with her diet. No Nausea or Vomiting. No lightheadedness or dizziness.   Objective: Vitals:   01/10/18 1530 01/10/18 1617 01/10/18 2123 01/11/18 0522  BP: (!) 159/76 (!) 169/82 (!) 153/63 (!) 155/60  Pulse: 84 97 91 86  Resp: (!) 21 20 20 20   Temp:  98.6 F (37 C) 99.2 F (37.3 C) 98.1 F (36.7 C)  TempSrc:  Oral Oral Oral  SpO2: 100% 97% 97% 97%  Weight:  (!) 214.5 kg (472 lb 14.4 oz)  (!) 221.5 kg (488 lb 5.1 oz)  Height:  5\' 5"  (1.651 m)      Intake/Output Summary (Last 24 hours) at 01/11/2018 0800 Last data filed at 01/11/2018 0524 Gross per 24 hour  Intake 3 ml  Output 650 ml  Net -647 ml   Filed Weights   01/10/18 0502 01/10/18 1617 01/11/18 0522  Weight: (!) 229.1 kg (505 lb) (!) 214.5 kg (472 lb 14.4 oz) (!) 221.5 kg (488 lb 5.1 oz)   Examination: Physical Exam:  Constitutional: WN/WD morbidly obese AAF in NAD and appears calm  Eyes: Lids and conjunctivae normal, sclerae anicteric  ENMT: External Ears, Nose appear normal. Grossly normal hearing. Mucous membranes are moist. Posterior pharynx clear of any exudate or lesions. Normal dentition.  Neck: Appears normal, supple, no cervical masses, normal ROM, no appreciable thyromegaly, difficult to determine JVD Respiratory: Diminished to  auscultation bilaterally, no wheezing, rales, rhonchi or crackles. Normal respiratory effort and patient is not tachypenic. No accessory muscle use.  Cardiovascular: RRR, no murmurs / rubs / gallops. S1 and S2 auscultated. Has Non pitting LE edema  Abdomen: Soft, non-tender, Distended due to body habitus. No masses palpated. No appreciable hepatosplenomegaly. Bowel sounds positive.  GU: Deferred. Musculoskeletal: No clubbing / cyanosis of digits/nails. No joint deformity upper and lower extremities.  Skin: No rashes, lesions, ulcers on a limited skin eval. No induration; Warm and dry.  Neurologic: CN 2-12 grossly intact with no focal deficits. Romberg sign and  cerebellar reflexes not assessed.  Psychiatric: Normal judgment and insight. Alert and oriented x 3. Normal mood and appropriate affect.   Data Reviewed: I have personally reviewed following labs and imaging studies  CBC: Recent Labs  Lab 01/10/18 0632 01/10/18 0639  WBC 5.5  --   NEUTROABS 3.6  --   HGB 11.7* 12.9  HCT 38.2 38.0  MCV 85.3  --   PLT 194  --    Basic Metabolic Panel: Recent Labs  Lab 01/10/18 0639 01/10/18 1922  01/11/18 0045  NA 142  --  143  K 5.3* 3.8 3.8  CL 100*  --  103  CO2  --   --  29  GLUCOSE 106*  --  139*  BUN 11  --  13  CREATININE 0.70  --  0.83  CALCIUM  --   --  9.1   GFR: Estimated Creatinine Clearance: 153.7 mL/min (by C-G formula based on SCr of 0.83 mg/dL). Liver Function Tests: No results for input(s): AST, ALT, ALKPHOS, BILITOT, PROT, ALBUMIN in the last 168 hours. No results for input(s): LIPASE, AMYLASE in the last 168 hours. No results for input(s): AMMONIA in the last 168 hours. Coagulation Profile: No results for input(s): INR, PROTIME in the last 168 hours. Cardiac Enzymes: Recent Labs  Lab 01/10/18 1257 01/10/18 1922 01/11/18 0045  TROPONINI <0.03 <0.03 <0.03   BNP (last 3 results) No results for input(s): PROBNP in the last 8760 hours. HbA1C: No results for  input(s): HGBA1C in the last 72 hours. CBG: No results for input(s): GLUCAP in the last 168 hours. Lipid Profile: No results for input(s): CHOL, HDL, LDLCALC, TRIG, CHOLHDL, LDLDIRECT in the last 72 hours. Thyroid Function Tests: No results for input(s): TSH, T4TOTAL, FREET4, T3FREE, THYROIDAB in the last 72 hours. Anemia Panel: No results for input(s): VITAMINB12, FOLATE, FERRITIN, TIBC, IRON, RETICCTPCT in the last 72 hours. Sepsis Labs: No results for input(s): PROCALCITON, LATICACIDVEN in the last 168 hours.  No results found for this or any previous visit (from the past 240 hour(s)).   Radiology Studies: Dg Chest 2 View  Result Date: 01/10/2018 CLINICAL DATA:  Chronic worsening shortness of breath. Personal history of sarcoidosis. EXAM: CHEST - 2 VIEW COMPARISON:  CT of the chest performed 02/14/2017, and chest radiograph performed 02/11/2017 FINDINGS: The lungs are well-aerated. Vascular congestion is noted. Relatively stable increased interstitial markings are again noted. There is no evidence of pleural effusion or pneumothorax. The heart is borderline normal in size. No acute osseous abnormalities are seen. IMPRESSION: Vascular congestion; relatively stable increased interstitial markings noted. Electronically Signed   By: Garald Balding M.D.   On: 01/10/2018 06:19   Scheduled Meds: . enoxaparin (LOVENOX) injection  55 mg Subcutaneous Q12H  . famotidine  40 mg Oral BID  . folic acid  1 mg Oral Daily  . furosemide  60 mg Intravenous BID  . levothyroxine  100 mcg Oral Q0600  . loratadine  10 mg Oral Daily  . losartan  100 mg Oral Daily  . metoprolol succinate  25 mg Oral Daily  . [START ON 01/13/2018] predniSONE  10 mg Oral Q breakfast  . predniSONE  40 mg Oral QAC breakfast  . sodium chloride flush  3 mL Intravenous Q12H  . cyanocobalamin  1,000 mcg Oral Daily   Continuous Infusions: . sodium chloride      LOS: 0 days   Kerney Elbe, DO [facility] Pager  [phone]  If 7PM-7AM, please contact night-coverage [email or web address] Password [email or web address] 01/11/2018, 8:00 AM

## 2018-01-11 NOTE — Consult Note (Signed)
   [facility] CM Inpatient Consult   01/11/2018  [name] [birth year] [id number]    Referral received from inpatient [facility] for [name] Management services.   Spoke with [name] at bedside to discuss and offer [name] Management program. She declines [facility] Care Management follow up. Denies having issues or concerns with transportation, medications, or with disease management, or community resources. Of note, her Primary Care Provider office ([place] [facility] [place]) is listed as doing their own transition of care call post hospital discharge.   Provided [facility] Care Management brochure and 24-hr nurse advice line magnet to contact in the future if needed.   Sent notification to inpatient RNCM to make aware [place] Management services were declined.    Marthenia Rolling, MSN-Ed, RN,BSN [facility] Liaison [phone]

## 2018-01-11 NOTE — Progress Notes (Signed)
  Echocardiogram 2D Echocardiogram with Definity has been performed.  Darlina Sicilian M 01/11/2018, 9:01 AM

## 2018-01-11 NOTE — Progress Notes (Signed)
PT Cancellation Note  Patient Details Name: CLEON THOMA MRN: [id number] DOB: Apr 16, 1966   Cancelled Treatment:    Reason Eval/Treat Not Completed: PT screened, no needs identified, will sign off(Pt reports she's ambulating independently, noted NT note from ED confirming she ambulated there without assitance, no PT indicated at this time. )   Philomena Doheny 01/11/2018, 11:53 AM [phone]

## 2018-01-11 NOTE — Progress Notes (Signed)
Pt stated that she did not like the hospital machine because she felt like she was suffocating. Pt also stated that she took of the machine last night and no longer wanted to use the machine.

## 2018-01-12 DIAGNOSIS — D500 Iron deficiency anemia secondary to blood loss (chronic): Secondary | ICD-10-CM

## 2018-01-12 DIAGNOSIS — F419 Anxiety disorder, unspecified: Secondary | ICD-10-CM

## 2018-01-12 LAB — CBC WITH DIFFERENTIAL/PLATELET
Basophils Absolute: 0 10*3/uL (ref 0.0–0.1)
Basophils Relative: 0 %
Eosinophils Absolute: 0 10*3/uL (ref 0.0–0.7)
Eosinophils Relative: 0 %
HEMATOCRIT: 40 % (ref 36.0–46.0)
HEMOGLOBIN: 12.3 g/dL (ref 12.0–15.0)
LYMPHS ABS: 1.6 10*3/uL (ref 0.7–4.0)
Lymphocytes Relative: 16 %
MCH: 26.3 pg (ref 26.0–34.0)
MCHC: 30.8 g/dL (ref 30.0–36.0)
MCV: 85.5 fL (ref 78.0–100.0)
Monocytes Absolute: 1 10*3/uL (ref 0.1–1.0)
Monocytes Relative: 10 %
NEUTROS ABS: 7.2 10*3/uL (ref 1.7–7.7)
NEUTROS PCT: 74 %
Platelets: 228 10*3/uL (ref 150–400)
RBC: 4.68 MIL/uL (ref 3.87–5.11)
RDW: 18.8 % — ABNORMAL HIGH (ref 11.5–15.5)
WBC: 9.8 10*3/uL (ref 4.0–10.5)

## 2018-01-12 LAB — BASIC METABOLIC PANEL
ANION GAP: 10 (ref 5–15)
BUN: 19 mg/dL (ref 6–20)
CHLORIDE: 100 mmol/L — AB (ref 101–111)
CO2: 31 mmol/L (ref 22–32)
Calcium: 8.8 mg/dL — ABNORMAL LOW (ref 8.9–10.3)
Creatinine, Ser: 0.84 mg/dL (ref 0.44–1.00)
GFR calc Af Amer: >60 mL/min (ref >60)
GLUCOSE: 131 mg/dL — AB (ref 65–99)
POTASSIUM: 4 mmol/L (ref 3.5–5.1)
Sodium: 141 mmol/L (ref 135–145)

## 2018-01-12 LAB — MAGNESIUM: Magnesium: 1.8 mg/dL (ref 1.7–2.4)

## 2018-01-12 LAB — PHOSPHORUS: Phosphorus: 3.7 mg/dL (ref 2.5–4.6)

## 2018-01-12 MED ORDER — METHOTREXATE SODIUM CHEMO INJECTION 50 MG/2ML
15.0000 mg | Freq: Once | INTRAMUSCULAR | Status: AC
  Administered 2018-01-12: 15 mg via SUBCUTANEOUS
  Filled 2018-01-12: qty 0.6

## 2018-01-12 NOTE — Progress Notes (Signed)
PROGRESS NOTE    [name]  ZOX:[id number] DOB: 06-Mar-1966 DOA: 01/10/2018 PCP: [name], NP   Brief Narrative:  [name] is a 52 y.o. female with medical history significant of anemia, diastolic heart failure , chronic respiratory failure on 2 lit [place], OHS, OSA on CPAP, Morbid Obesity, GERD, Hypertension, Hypothyroidism, Sarcoidosis who comes in for worsening Shortness of Breath for the last 3 weeks, associated with dry cough, subjective fevers, chills and hypoxia on ambulation and worsening leg edema.   She reported some chest discomfort, non radiating and associated with ambulation. Some nausea today, no vomiting, . She reported some loose bowel movements two days ago, which has resolved. No hematochezia, no hemoptysis, no syncope, no headache or dizziness, no sensory deficits.   On arrival to ED, she was tachypneic, hypoxic with sats at 88% on 2 Liters on ambulation. Pt reports on ambulation at home, her sats have dropped to 70%. She also reports non compliance to fluid and diet and reports missing her prednisone and lasix over the last 4 weeks. CXR showed some interstitial edema.  EKG did not show any ischemic changes. She was referred to medical service for admission and is currently being treated for Acute Decompensation of Chronic Diastolic CHF.   Assessment & Plan:   Active Problems:   OSA (obstructive sleep apnea)   Obesity hypoventilation syndrome (HCC)   Hypertension   Sarcoidosis   Iron deficiency anemia due to chronic blood loss   Hypothyroidism   Acute on chronic diastolic CHF (congestive heart failure) (HCC)   Congestive heart failure (HCC)   Anxiety   Chronic respiratory failure (HCC)   Diastolic dysfunction with acute on chronic heart failure (HCC)   GERD (gastroesophageal reflux disease)   CHF (congestive heart failure) (HCC)   Diastolic CHF ([place])  Acute on Chronic Respiratory Failure with Hypoxia 2/2 to Acute on Chronic Diastolic Heart  Failure.  -Admitted to Telemetry.  -C/w IV lasix 60 mg BID. Upon further discussion with the patient she did not take her 60 mg po Lasix BID as she should have and did not comply to a low salt diet  -C/w Metoprolol XL 25 mg po Daily and Losartan 100 mg po Daily  -Echocardiogram done and showed The estimated ejection fraction was in the range of 55% to 60%. Wall motion was normal; there were no regional wall motion abnormalities. The PA Pressure was 35 -C/w Daily Weights, and Strict Intake and Output.  -C/w Heart Healthy Diet with 1500 mL Fluid Restriction -Troponin were <0.03 x3 -Patient is -3.527 mL; Weight is down from 505 to 488 lbs -Continue to Monitor Volume Status Carefully -Checked Ambulatory Pulse Oximetry Screen this AM and she desaturated on Room Air to 77%; Repeat Ambulatory Pulse Oximetry Screening on 2 Liters showed she still desaturated to the low 80's.  -C/w Diuresis and repeat Ambulatory Pulse Oximetry Screen in AM -Repeat CXR in AM    OSA on CPAP at Night  -Suspect Noncompliance -C/w CPAP but per Respiratory note she did not like the [place] because it made her feel like she was "Suffocating."  Chronic Rrespiratory Failure on 2 Liters of [place] oxygen 2/2 to OHS, OSA, Sarcoidosis -Follows up with Dr Elsworth Soho as outpatient.  -Increased her Prednisone from 10 mg daily to 40 mg daily and taper quickly over the next week.  -C/w Albuterol Neb 2.5 mg Neb q4hprn Wheezing or SOB -Repeat Ambulatory Pulse Oximetry Screening and will do in AM  -Repeat CXR in AM  Hypothyroidism -Check TSH -C/w Levothyroxine 100 mcg po Daily   Hypertension -C/w Losartan 100 mg po Daily and Metoprolol XL 25 mg po Daily -Add Hydralazine 10 mg IV q6hprn for SBP >180 or DBP>100  Hyperkalemia, improved -Improved. K+ went from 5.3 -> 4.0 -Continue to Monitor and Repeat CMP in AM   GERD -Stable. -C/w Famotidine 40 mg po BID  Sarcoidosis -As Above -C/w Prednisone 40 mg po  Daily  Hypomagnesemia -Mag Level was 1.4 and improved to 1.8 -Replete with IV Mag Sulfate 2 grams yesterday  -Continue to Monitor and Replete as Necessary -Repeat Mag Level in AM  DVT prophylaxis: Enoxaparin 55 mg q12h  Code Status: FULL CODE Family Communication: No family present at bedside Disposition Plan: Remain Inpatient for continued Diuresis and anticipate D/C within 24-48 hours if improved.  Consultants:   None   Procedures:  ECHOCARDIOGRAM ------------------------------------------------------------------- Study Conclusions  - Left ventricle: The cavity size was normal. Wall thickness was   increased in a pattern of mild LVH. Systolic function was normal.   The estimated ejection fraction was in the range of 55% to 60%.   Wall motion was normal; there were no regional wall motion   abnormalities. - Aortic valve: Trileaflet; mildly calcified leaflets. - Right ventricle: The cavity size was mildly dilated. - Pulmonary arteries: Systolic pressure was mildly increased. PA   peak pressure: 35 mm Hg (S). - Pericardium, extracardiac: A trivial pericardial effusion was   identified.  Antimicrobials:  Anti-infectives (From admission, onward)   None     Subjective: Seen and examined at bedside felt ok. No nausea, vomiting, CP. SOB is stable. No lightheadedness or dizziness.   Objective: Vitals:   01/11/18 1329 01/11/18 2026 01/12/18 0657 01/12/18 1317  BP: 122/83 110/60 121/70 128/60  Pulse: 79 82 66 76  Resp:  20 20 18   Temp: 98 F (36.7 C) 99.5 F (37.5 C) 98.2 F (36.8 C) 98.5 F (36.9 C)  TempSrc: Oral Oral Oral Oral  SpO2: 98% 97% 100% 99%  Weight:   (!) 221.7 kg (488 lb 12.2 oz)   Height:        Intake/Output Summary (Last 24 hours) at 01/12/2018 1333 Last data filed at 01/12/2018 1319 Gross per 24 hour  Intake 320 ml  Output 3200 ml  Net -2880 ml   Filed Weights   01/10/18 1617 01/11/18 0522 01/12/18 0657  Weight: (!) 214.5 kg (472 lb 14.4  oz) (!) 221.5 kg (488 lb 5.1 oz) (!) 221.7 kg (488 lb 12.2 oz)   Examination: Physical Exam:  Constitutional: WN/WD morbidly obese AAF in NAD and appears calm sitting bedside  Eyes: Sclerae anicteric; Lids normal ENMT: External Ears and Nose appear normal Neck: Supple; Difficult to determine JVD Respiratory: Diminished but unlabored Breathing; No appreciable wheezing/rales/rhonchi but is wearing supplemental O2 via [place] Cardiovascular: Distant Heart Sounds but RRR; Has non-pitting LE edema Abdomen: Soft, NT, Distended due to body habitus; Bowel sounds present  GU: Deferred Musculoskeletal: No contractures; No cyanosis Skin: Warm and Dry; No appreciable Rashes or lesions on a limited skin eval  Neurologic: CN 2-12 grossly intact. No appreciable focal deficits Psychiatric: Normal mood and affect. Intact judgement and insight  Data Reviewed: I have personally reviewed following labs and imaging studies  CBC: Recent Labs  Lab 01/10/18 0632 01/10/18 0639 01/11/18 0045 01/12/18 0930  WBC 5.5  --  8.9 9.8  NEUTROABS 3.6  --  8.0* 7.2  HGB 11.7* 12.9 12.2 12.3  HCT 38.2 38.0 39.5  40.0  MCV 85.3  --  84.0 85.5  PLT 194  --  217 629   Basic Metabolic Panel: Recent Labs  Lab 01/10/18 0639 01/10/18 1922 01/11/18 0045 01/12/18 0426  NA 142  --  143 141  K 5.3* 3.8 3.8 4.0  CL 100*  --  103 100*  CO2  --   --  29 31  GLUCOSE 106*  --  139* 131*  BUN 11  --  13 19  CREATININE 0.70  --  0.83 0.84  CALCIUM  --   --  9.1 8.8*  MG  --   --  1.4* 1.8  PHOS  --   --  2.8 3.7   GFR: Estimated Creatinine Clearance: 152 mL/min (by C-G formula based on SCr of 0.84 mg/dL). Liver Function Tests: No results for input(s): AST, ALT, ALKPHOS, BILITOT, PROT, ALBUMIN in the last 168 hours. No results for input(s): LIPASE, AMYLASE in the last 168 hours. No results for input(s): AMMONIA in the last 168 hours. Coagulation Profile: No results for input(s): INR, PROTIME in the last 168  hours. Cardiac Enzymes: Recent Labs  Lab 01/10/18 1257 01/10/18 1922 01/11/18 0045  TROPONINI <0.03 <0.03 <0.03   BNP (last 3 results) No results for input(s): PROBNP in the last 8760 hours. HbA1C: No results for input(s): HGBA1C in the last 72 hours. CBG: No results for input(s): GLUCAP in the last 168 hours. Lipid Profile: No results for input(s): CHOL, HDL, LDLCALC, TRIG, CHOLHDL, LDLDIRECT in the last 72 hours. Thyroid Function Tests: No results for input(s): TSH, T4TOTAL, FREET4, T3FREE, THYROIDAB in the last 72 hours. Anemia Panel: No results for input(s): VITAMINB12, FOLATE, FERRITIN, TIBC, IRON, RETICCTPCT in the last 72 hours. Sepsis Labs: No results for input(s): PROCALCITON, LATICACIDVEN in the last 168 hours.  No results found for this or any previous visit (from the past 240 hour(s)).   Radiology Studies: No results found. Scheduled Meds: . enoxaparin (LOVENOX) injection  55 mg Subcutaneous Q12H  . famotidine  40 mg Oral BID  . folic acid  1 mg Oral Daily  . furosemide  60 mg Intravenous BID  . levothyroxine  100 mcg Oral Q0600  . loratadine  10 mg Oral Daily  . losartan  100 mg Oral Daily  . methotrexate  15 mg Subcutaneous Once  . metoprolol succinate  25 mg Oral Daily  . [START ON 01/13/2018] predniSONE  10 mg Oral Q breakfast  . sodium chloride flush  3 mL Intravenous Q12H  . cyanocobalamin  1,000 mcg Oral Daily   Continuous Infusions: . sodium chloride      LOS: 1 day   Kerney Elbe, DO [facility] Pager [phone]  If 7PM-7AM, please contact night-coverage [email or web address] Password [email or web address] 01/12/2018, 1:33 PM

## 2018-01-12 NOTE — Progress Notes (Signed)
Offered patient CPAP at this time which was declined.  Stated that if patient changed her mind that she could call.

## 2018-01-12 NOTE — Progress Notes (Signed)
SATURATION QUALIFICATIONS: (This note is used to comply with regulatory documentation for home oxygen)  Patient Saturations on Room Air at Rest = 85%  Patient Saturations on Room Air while Ambulating = 77%  Patient Saturations on 2 Liters of oxygen while Ambulating = 82%  Please briefly explain why patient needs home oxygen:

## 2018-01-13 ENCOUNTER — Inpatient Hospital Stay (HOSPITAL_COMMUNITY): Payer: Medicare HMO

## 2018-01-13 LAB — COMPREHENSIVE METABOLIC PANEL
ALK PHOS: 31 U/L — AB (ref 38–126)
ALT: 10 U/L — ABNORMAL LOW (ref 14–54)
ANION GAP: 9 (ref 5–15)
AST: 10 U/L — ABNORMAL LOW (ref 15–41)
Albumin: 3.9 g/dL (ref 3.5–5.0)
BILIRUBIN TOTAL: 0.4 mg/dL (ref 0.3–1.2)
BUN: 23 mg/dL — ABNORMAL HIGH (ref 6–20)
CALCIUM: 9.1 mg/dL (ref 8.9–10.3)
CO2: 36 mmol/L — AB (ref 22–32)
Chloride: 99 mmol/L — ABNORMAL LOW (ref 101–111)
Creatinine, Ser: 0.85 mg/dL (ref 0.44–1.00)
Glucose, Bld: 120 mg/dL — ABNORMAL HIGH (ref 65–99)
Potassium: 4.1 mmol/L (ref 3.5–5.1)
SODIUM: 144 mmol/L (ref 135–145)
TOTAL PROTEIN: 7.2 g/dL (ref 6.5–8.1)

## 2018-01-13 LAB — CBC WITH DIFFERENTIAL/PLATELET
Basophils Absolute: 0 10*3/uL (ref 0.0–0.1)
Basophils Relative: 0 %
EOS ABS: 0 10*3/uL (ref 0.0–0.7)
Eosinophils Relative: 0 %
HCT: 37.7 % (ref 36.0–46.0)
HEMOGLOBIN: 11.5 g/dL — AB (ref 12.0–15.0)
LYMPHS ABS: 1.1 10*3/uL (ref 0.7–4.0)
Lymphocytes Relative: 12 %
MCH: 25.6 pg — AB (ref 26.0–34.0)
MCHC: 30.5 g/dL (ref 30.0–36.0)
MCV: 83.8 fL (ref 78.0–100.0)
MONOS PCT: 12 %
Monocytes Absolute: 1.1 10*3/uL — ABNORMAL HIGH (ref 0.1–1.0)
Neutro Abs: 7 10*3/uL (ref 1.7–7.7)
Neutrophils Relative %: 76 %
Platelets: 195 10*3/uL (ref 150–400)
RBC: 4.5 MIL/uL (ref 3.87–5.11)
RDW: 18.5 % — ABNORMAL HIGH (ref 11.5–15.5)
WBC: 9.2 10*3/uL (ref 4.0–10.5)

## 2018-01-13 LAB — MAGNESIUM: MAGNESIUM: 1.9 mg/dL (ref 1.7–2.4)

## 2018-01-13 LAB — TSH: TSH: 1.992 u[IU]/mL (ref 0.350–4.500)

## 2018-01-13 LAB — PHOSPHORUS: PHOSPHORUS: 4.1 mg/dL (ref 2.5–4.6)

## 2018-01-13 MED ORDER — FUROSEMIDE 10 MG/ML IJ SOLN
20.0000 mg | Freq: Once | INTRAMUSCULAR | Status: AC
  Administered 2018-01-13: 20 mg via INTRAVENOUS
  Filled 2018-01-13: qty 2

## 2018-01-13 MED ORDER — FUROSEMIDE 10 MG/ML IJ SOLN
80.0000 mg | Freq: Two times a day (BID) | INTRAMUSCULAR | Status: DC
  Administered 2018-01-13 – 2018-01-17 (×8): 80 mg via INTRAVENOUS
  Filled 2018-01-13 (×8): qty 8

## 2018-01-13 NOTE — Progress Notes (Signed)
PHARMACY CONSULT: Lovenox for VTE prophylaxis   Wt: 220kg BMI:  78.6 Scr:  0.7 CrCl >30 ml/hr  CBC: stable  A/P:  Continue Lovenox 55mg  q12h for VTE ppx Monitor CBC and renal function, adjust as needed   Adrian Saran, PharmD, BCPS Pager [phone] 01/13/2018 9:56 AM

## 2018-01-13 NOTE — Progress Notes (Addendum)
PROGRESS NOTE    [name]  ONG:[id number] DOB: 27-Aug-1966 DOA: 01/10/2018 PCP: [name], NP   Brief Narrative:  [name] is a 52 y.o. female with medical history significant of anemia, diastolic heart failure , chronic respiratory failure on 2 lit [place], OHS, OSA on CPAP, Morbid Obesity, GERD, Hypertension, Hypothyroidism, Sarcoidosis who comes in for worsening Shortness of Breath for the last 3 weeks, associated with dry cough, subjective fevers, chills and hypoxia on ambulation and worsening leg edema.   She reported some chest discomfort, non radiating and associated with ambulation. Some nausea today, no vomiting, . She reported some loose bowel movements two days ago, which has resolved. No hematochezia, no hemoptysis, no syncope, no headache or dizziness, no sensory deficits.   On arrival to ED, she was tachypneic, hypoxic with sats at 88% on 2 Liters on ambulation. Pt reports on ambulation at home, her sats have dropped to 70%. She also reports non compliance to fluid and diet and reports missing her prednisone and lasix over the last 4 weeks. CXR showed some interstitial edema.  EKG did not show any ischemic changes. She was referred to medical service for admission and is currently being treated for Acute Decompensation of Chronic Diastolic CHF. IV Lasix increased from 60 mg BID to 80 mg BID today as patient still remains dyspneic with Ambulation.   Assessment & Plan:   Active Problems:   OSA (obstructive sleep apnea)   Obesity hypoventilation syndrome (HCC)   Hypertension   Sarcoidosis   Iron deficiency anemia due to chronic blood loss   Hypothyroidism   Acute on chronic diastolic CHF (congestive heart failure) (HCC)   Congestive heart failure (HCC)   Anxiety   Chronic respiratory failure (HCC)   Diastolic dysfunction with acute on chronic heart failure (HCC)   GERD (gastroesophageal reflux disease)   CHF (congestive heart failure) (HCC)    Diastolic CHF ([place])  Acute on Chronic Respiratory Failure with Hypoxia 2/2 to Acute on Chronic Diastolic Heart Failure.  -Admitted to Telemetry.  -Check BNP -Incraesed IV Lasix 60 mg BID to 80 mg BID. Upon further discussion with the patient she did not take her 60 mg po Lasix BID as she should have and did not comply to a low salt diet  -C/w Metoprolol XL 25 mg po Daily and Losartan 100 mg po Daily  -Echocardiogram done and showed The estimated ejection fraction was in the range of 55% to 60%. Wall motion was normal; there were no regional wall motion abnormalities. The PA Pressure was 35 -C/w Daily Weights, and Strict Intake and Output.  -C/w Heart Healthy Diet with 1500 mL Fluid Restriction -Troponin were <0.03 x3 -Patient is -6.464 mL; Weight is down from 505 to 486 lbs -Continue to Monitor Volume Status Carefully -Repeat Ambulatory Pulse Oximetry Screen this AM  -C/w Diuresis as above -Repeat CXR this AM showed Cardiomegaly and improved central venous congestion.  OSA on CPAP at Night  -Suspect Noncompliance -C/w CPAP while hospitalized but declined again   Chronic Rrespiratory Failure on 2 Liters of [place] oxygen 2/2 to OHS, OSA, Sarcoidosis -Follows up with Dr. Elsworth Soho as outpatient.  -Increased her Prednisone from 10 mg daily to 40 mg daily and taper quickly over the next week.  -C/w Albuterol Neb 2.5 mg Neb q4hprn Wheezing or SOB -Repeat Ambulatory Pulse Oximetry Screening this AM -Repeat CXR done this AM as above    Hypothyroidism -Checked TSH and was 1.992 -C/w Levothyroxine 100 mcg po Daily  Hypertension -C/w Losartan 100 mg po Daily and Metoprolol XL 25 mg po Daily -Add Hydralazine 10 mg IV q6hprn for SBP >180 or DBP>100  Hyperkalemia, improved -Improved. K+ went from 5.3 -> 4.1 -Continue to Monitor and Repeat CMP in AM   GERD -Stable. -C/w Famotidine 40 mg po BID  Sarcoidosis -As Above -C/w Prednisone 40 mg po Daily x3 days and then change back to 10 mg  po Daily   Hypomagnesemia -Mag Level was 1.4 and improved to 1.9 -Continue to Monitor and Replete as Necessary -Repeat Mag Level in AM  Normocytic Anemia -Patient's Hb/Hct went from 12.3/40.0 -> 11.5/37.7 -Has Hx of IDA -Check Anemia Panel in AM -Continue to Monitor for S/Sx of Bleeding -Repeat CBC in AM   DVT prophylaxis: Enoxaparin 55 mg q12h  Code Status: FULL CODE Family Communication: No family present at bedside Disposition Plan: Remain Inpatient for continued Diuresis today and pending clinical improvement; Anticipate D/C within 24-48 hours if improved.  Consultants:   None   Procedures:  ECHOCARDIOGRAM ------------------------------------------------------------------- Study Conclusions  - Left ventricle: The cavity size was normal. Wall thickness was   increased in a pattern of mild LVH. Systolic function was normal.   The estimated ejection fraction was in the range of 55% to 60%.   Wall motion was normal; there were no regional wall motion   abnormalities. - Aortic valve: Trileaflet; mildly calcified leaflets. - Right ventricle: The cavity size was mildly dilated. - Pulmonary arteries: Systolic pressure was mildly increased. PA   peak pressure: 35 mm Hg (S). - Pericardium, extracardiac: A trivial pericardial effusion was   identified.  Antimicrobials:  Anti-infectives (From admission, onward)   None     Subjective: Seen and examined and denied complaints but states she remained dyspneic on Ambulation. No lightheadedness or dizziness. No other concerns or complaints at this time.   Objective: Vitals:   01/12/18 0657 01/12/18 1317 01/12/18 2234 01/13/18 0651  BP: 121/70 128/60 140/68 (!) 144/64  Pulse: 66 76 66 60  Resp: 20 18 18 18   Temp: 98.2 F (36.8 C) 98.5 F (36.9 C) 97.8 F (36.6 C) 98 F (36.7 C)  TempSrc: Oral Oral Oral Oral  SpO2: 100% 99% 98% 100%  Weight: (!) 221.7 kg (488 lb 12.2 oz)   (!) 220.5 kg (486 lb 1.6 oz)  Height:         Intake/Output Summary (Last 24 hours) at 01/13/2018 1258 Last data filed at 01/13/2018 1200 Gross per 24 hour  Intake 383 ml  Output 3800 ml  Net -3417 ml   Filed Weights   01/11/18 0522 01/12/18 0657 01/13/18 0651  Weight: (!) 221.5 kg (488 lb 5.1 oz) (!) 221.7 kg (488 lb 12.2 oz) (!) 220.5 kg (486 lb 1.6 oz)   Examination: Physical Exam:  Constitutional: WN/WD morbidly obese pleasant AAF in NAD appears calm sitting bedside.  Eyes: Sclerae anicteric. Lids normal ENMT: External Ears and nose appear normal. MMM Neck: Supple; Very hard to tell if she has JVD due to body habitus Respiratory: Diminished breath sounds with unlabored breathing; Has no appreciable wheezing/rales/rhonchi. Continues to wear supplemental O2 Cardiovascular: Heart Sounds are distant but RRR; No appreciable m/r/g Abdomen: Soft, NT, Distended due to body habitus; Has non-pitting LE edema GU: Deferred Musculoskeletal: No contractures; No cyanosis Skin: Warm and Dry; No appreciable Rashes or lesions on a limited skin evaluation  Neurologic: CN 2-12 grossly intact. No appreciable focal deficits Psychiatric: Normal mood and affect. Intact judgement and insight.  Data  Reviewed: I have personally reviewed following labs and imaging studies  CBC: Recent Labs  Lab 01/10/18 0632 01/10/18 0639 01/11/18 0045 01/12/18 0930 01/13/18 0447  WBC 5.5  --  8.9 9.8 9.2  NEUTROABS 3.6  --  8.0* 7.2 7.0  HGB 11.7* 12.9 12.2 12.3 11.5*  HCT 38.2 38.0 39.5 40.0 37.7  MCV 85.3  --  84.0 85.5 83.8  PLT 194  --  217 228 301   Basic Metabolic Panel: Recent Labs  Lab 01/10/18 0639 01/10/18 1922 01/11/18 0045 01/12/18 0426 01/13/18 0447  NA 142  --  143 141 144  K 5.3* 3.8 3.8 4.0 4.1  CL 100*  --  103 100* 99*  CO2  --   --  29 31 36*  GLUCOSE 106*  --  139* 131* 120*  BUN 11  --  13 19 23*  CREATININE 0.70  --  0.83 0.84 0.85  CALCIUM  --   --  9.1 8.8* 9.1  MG  --   --  1.4* 1.8 1.9  PHOS  --   --  2.8 3.7  4.1   GFR: Estimated Creatinine Clearance: 149.6 mL/min (by C-G formula based on SCr of 0.85 mg/dL). Liver Function Tests: Recent Labs  Lab 01/13/18 0447  AST 10*  ALT 10*  ALKPHOS 31*  BILITOT 0.4  PROT 7.2  ALBUMIN 3.9   No results for input(s): LIPASE, AMYLASE in the last 168 hours. No results for input(s): AMMONIA in the last 168 hours. Coagulation Profile: No results for input(s): INR, PROTIME in the last 168 hours. Cardiac Enzymes: Recent Labs  Lab 01/10/18 1257 01/10/18 1922 01/11/18 0045  TROPONINI <0.03 <0.03 <0.03   BNP (last 3 results) No results for input(s): PROBNP in the last 8760 hours. HbA1C: No results for input(s): HGBA1C in the last 72 hours. CBG: No results for input(s): GLUCAP in the last 168 hours. Lipid Profile: No results for input(s): CHOL, HDL, LDLCALC, TRIG, CHOLHDL, LDLDIRECT in the last 72 hours. Thyroid Function Tests: Recent Labs    01/13/18 0447  TSH 1.992   Anemia Panel: No results for input(s): VITAMINB12, FOLATE, FERRITIN, TIBC, IRON, RETICCTPCT in the last 72 hours. Sepsis Labs: No results for input(s): PROCALCITON, LATICACIDVEN in the last 168 hours.  No results found for this or any previous visit (from the past 240 hour(s)).   Radiology Studies: Dg Chest Port 1 View  Result Date: 01/13/2018 CLINICAL DATA:  sob EXAM: PORTABLE CHEST 1 VIEW COMPARISON:  None. FINDINGS: Stable enlarged cardiac silhouette. Interval improvement in central venous congestion. Small RIGHT effusion. No pneumothorax. IMPRESSION: Cardiomegaly.  Improved central venous congestion. Electronically Signed   By: Suzy Bouchard M.D.   On: 01/13/2018 07:14   Scheduled Meds: . enoxaparin (LOVENOX) injection  55 mg Subcutaneous Q12H  . famotidine  40 mg Oral BID  . folic acid  1 mg Oral Daily  . furosemide  80 mg Intravenous BID  . levothyroxine  100 mcg Oral Q0600  . loratadine  10 mg Oral Daily  . losartan  100 mg Oral Daily  . metoprolol succinate   25 mg Oral Daily  . predniSONE  10 mg Oral Q breakfast  . sodium chloride flush  3 mL Intravenous Q12H  . cyanocobalamin  1,000 mcg Oral Daily   Continuous Infusions: . sodium chloride      LOS: 2 days   Kerney Elbe, DO [facility] Pager [phone]  If 7PM-7AM, please contact night-coverage [email or web address] Password [facility] 01/13/2018,  12:58 PM

## 2018-01-14 ENCOUNTER — Encounter: Payer: Self-pay | Admitting: Hematology

## 2018-01-14 DIAGNOSIS — R0602 Shortness of breath: Secondary | ICD-10-CM

## 2018-01-14 LAB — CBC WITH DIFFERENTIAL/PLATELET
Basophils Absolute: 0.1 10*3/uL (ref 0.0–0.1)
Basophils Relative: 1 %
EOS ABS: 0.2 10*3/uL (ref 0.0–0.7)
Eosinophils Relative: 3 %
HCT: 39.4 % (ref 36.0–46.0)
HEMOGLOBIN: 11.7 g/dL — AB (ref 12.0–15.0)
LYMPHS PCT: 33 %
Lymphs Abs: 1.8 10*3/uL (ref 0.7–4.0)
MCH: 25.3 pg — ABNORMAL LOW (ref 26.0–34.0)
MCHC: 29.7 g/dL — AB (ref 30.0–36.0)
MCV: 85.3 fL (ref 78.0–100.0)
Monocytes Absolute: 0.5 10*3/uL (ref 0.1–1.0)
Monocytes Relative: 10 %
NEUTROS ABS: 2.7 10*3/uL (ref 1.7–7.7)
Neutrophils Relative %: 53 %
Platelets: 181 10*3/uL (ref 150–400)
RBC: 4.62 MIL/uL (ref 3.87–5.11)
RDW: 18.7 % — AB (ref 11.5–15.5)
WBC: 5.3 10*3/uL (ref 4.0–10.5)

## 2018-01-14 LAB — COMPREHENSIVE METABOLIC PANEL
ALBUMIN: 3.8 g/dL (ref 3.5–5.0)
ALT: 13 U/L — ABNORMAL LOW (ref 14–54)
ANION GAP: 12 (ref 5–15)
AST: 15 U/L (ref 15–41)
Alkaline Phosphatase: 34 U/L — ABNORMAL LOW (ref 38–126)
BUN: 27 mg/dL — ABNORMAL HIGH (ref 6–20)
CO2: 33 mmol/L — AB (ref 22–32)
Calcium: 8.9 mg/dL (ref 8.9–10.3)
Chloride: 98 mmol/L — ABNORMAL LOW (ref 101–111)
Creatinine, Ser: 0.76 mg/dL (ref 0.44–1.00)
GFR calc Af Amer: >60 mL/min (ref >60)
GFR calc non Af Amer: >60 mL/min (ref >60)
GLUCOSE: 96 mg/dL (ref 65–99)
POTASSIUM: 3.4 mmol/L — AB (ref 3.5–5.1)
Sodium: 143 mmol/L (ref 135–145)
Total Bilirubin: 0.7 mg/dL (ref 0.3–1.2)
Total Protein: 7 g/dL (ref 6.5–8.1)

## 2018-01-14 LAB — RETICULOCYTES
RBC.: 4.62 MIL/uL (ref 3.87–5.11)
Retic Count, Absolute: 60.1 10*3/uL (ref 19.0–186.0)
Retic Ct Pct: 1.3 % (ref 0.4–3.1)

## 2018-01-14 LAB — IRON AND TIBC
IRON: 45 ug/dL (ref 28–170)
Saturation Ratios: 15 % (ref 10.4–31.8)
TIBC: 305 ug/dL (ref 250–450)
UIBC: 260 ug/dL

## 2018-01-14 LAB — VITAMIN B12: VITAMIN B 12: 387 pg/mL (ref 180–914)

## 2018-01-14 LAB — BRAIN NATRIURETIC PEPTIDE: B NATRIURETIC PEPTIDE 5: 11.1 pg/mL (ref 0.0–100.0)

## 2018-01-14 LAB — PHOSPHORUS: Phosphorus: 4.2 mg/dL (ref 2.5–4.6)

## 2018-01-14 LAB — FOLATE: FOLATE: 12 ng/mL (ref >5.9)

## 2018-01-14 LAB — MAGNESIUM: Magnesium: 1.8 mg/dL (ref 1.7–2.4)

## 2018-01-14 LAB — FERRITIN: Ferritin: 26 ng/mL (ref 11–307)

## 2018-01-14 MED ORDER — MAGNESIUM SULFATE 2 GM/50ML IV SOLN
2.0000 g | Freq: Once | INTRAVENOUS | Status: AC
  Administered 2018-01-14: 2 g via INTRAVENOUS
  Filled 2018-01-14: qty 50

## 2018-01-14 MED ORDER — POTASSIUM CHLORIDE CRYS ER 20 MEQ PO TBCR
40.0000 meq | EXTENDED_RELEASE_TABLET | Freq: Two times a day (BID) | ORAL | Status: DC
  Administered 2018-01-14 – 2018-01-17 (×7): 40 meq via ORAL
  Filled 2018-01-14 (×7): qty 2

## 2018-01-14 NOTE — Progress Notes (Signed)
SATURATION QUALIFICATIONS: (This note is used to comply with regulatory documentation for home oxygen)  Patient Saturations on Room Air at Rest = 99%  Patient Saturations on Room Air while Ambulating =   Patient Saturations on 2 Liters of oxygen while Ambulating = 84%  Please briefly explain why patient needs home oxygen:

## 2018-01-14 NOTE — Progress Notes (Signed)
RN informed by central monitoring of tele showing vfib/vtach. MD notified and ordered mag run x1, ekg performed. Pt currently stable, no chest pain.

## 2018-01-14 NOTE — Progress Notes (Signed)
PROGRESS NOTE    [name]  YQI:[id number] DOB: 1966-04-10 DOA: 01/10/2018 PCP: [name], NP   Brief Narrative:  [name] is a 52 y.o. female with medical history significant of anemia, diastolic heart failure , chronic respiratory failure on 2 lit [place], OHS, OSA on CPAP, Morbid Obesity, GERD, Hypertension, Hypothyroidism, Sarcoidosis who comes in for worsening Shortness of Breath for the last 3 weeks, associated with dry cough, subjective fevers, chills and hypoxia on ambulation and worsening leg edema.   She reported some chest discomfort, non radiating and associated with ambulation. Some nausea today, no vomiting, . She reported some loose bowel movements two days ago, which has resolved. No hematochezia, no hemoptysis, no syncope, no headache or dizziness, no sensory deficits.   On arrival to ED, she was tachypneic, hypoxic with sats at 88% on 2 Liters on ambulation. Pt reports on ambulation at home, her sats have dropped to 70%. She also reports non compliance to fluid and diet and reports missing her prednisone and lasix over the last 4 weeks. CXR showed some interstitial edema.  EKG did not show any ischemic changes. She was referred to medical service for admission and is currently being treated for Acute Decompensation of Chronic Diastolic CHF. IV Lasix increased from 60 mg BID to 80 mg BID yesterday as patient still remains dyspneic with Ambulation. She also started having some runs of VTACH. Will replete electrolytes and consult Cardiology in AM for further evaluation and recommendations of CHF and VTACH.   Assessment & Plan:   Active Problems:   OSA (obstructive sleep apnea)   Obesity hypoventilation syndrome (HCC)   Hypertension   Sarcoidosis   Iron deficiency anemia due to chronic blood loss   Hypothyroidism   Acute on chronic diastolic CHF (congestive heart failure) (HCC)   Congestive heart failure (HCC)   Anxiety   Chronic respiratory failure  (HCC)   Diastolic dysfunction with acute on chronic heart failure (HCC)   GERD (gastroesophageal reflux disease)   CHF (congestive heart failure) (HCC)   Diastolic CHF ([place])  Acute on Chronic Respiratory Failure with Hypoxia 2/2 to Acute on Chronic Diastolic Heart Failure.  -Admitted to Telemetry.  -Checked BNP and was 11.1 -Incraesed IV Lasix 60 mg BID to 80 mg BID yesterday. Upon further discussion with the patient she did not take her 60 mg po Lasix BID as she should have and did not comply to a low salt diet  -C/w Metoprolol XL 25 mg po Daily and Losartan 100 mg po Daily  -Echocardiogram done and showed The estimated ejection fraction was in the range of 55% to 60%. Wall motion was normal; there were no regional wall motion abnormalities. The PA Pressure was 35 -C/w Daily Weights, and Strict Intake and Output.  -C/w Heart Healthy Diet and 1500 mL Fluid Restriction reduced to 1200 mL -Troponin were <0.03 x3 -Patient is -10.911 mL; Weight is down from 505 to 485 lbs -Continue to Monitor Volume Status Carefully -Repeat Ambulatory Pulse Oximetry Screen this AM showed she desaturated on even 2 and 3 Liters  -C/w Diuresis as above -Repeat CXR yesterday AM showed Cardiomegaly and improved central venous congestion. -Will consult Cardiology for further evaluation and Recommendations   Non-sustained VTACH -Replete Electrolytes; (K+ was Low and Mag -Repeat EKG  -Will discuss with Cardiology when consulted for CHF  OSA on CPAP at Night  -Suspect Noncompliance -C/w CPAP while hospitalized but declined again   Chronic Rrespiratory Failure on 2 Liters of [place]  oxygen 2/2 to OHS, OSA, Sarcoidosis -Follows up with Dr. Elsworth Soho as outpatient.  -Increased her Prednisone from 10 mg daily to 40 mg daily and taper quickly over the next week.  -C/w Albuterol Neb 2.5 mg Neb q4hprn Wheezing or SOB -Repeat Ambulatory Pulse Oximetry Screening this AM -Repeat CXR done this AM as above     Hypothyroidism -Checked TSH and was 1.992 -C/w Levothyroxine 100 mcg po Daily   Hypertension -C/w Losartan 100 mg po Daily and Metoprolol XL 25 mg po Daily -Add Hydralazine 10 mg IV q6hprn for SBP >180 or DBP>100  Hyperkalemia, improved and -> Hypokalemia -Improved. K+ went from 5.3 -> 4.1 -> 3.4 -Replete -Continue to Monitor and Repeat CMP in AM   GERD -Stable. -C/w Famotidine 40 mg po BID  Sarcoidosis -As Above -C/w Prednisone 40 mg po Daily x3 days and then change back to 10 mg po Daily   Hypomagnesemia -Mag Level was 1.4 and improved to 1.8 -Continue to Monitor and Replete as Necessary -Repeat Mag Level in AM  Normocytic Anemia -Patient's Hb/Hct went from 12.3/40.0 -> 11.5/37.7 -> 11.7/39.4 -Has Hx of IDA -Anemia Panel showed Iron Level of 45, UIBC of 260, TIBC of 305, 15 Saturation Ratios, Ferritin of 26, Folate of 12.0, and Vitamin B12 of 387 -Continue to Monitor for S/Sx of Bleeding -Repeat CBC in AM   DVT prophylaxis: Enoxaparin 55 mg q12h  Code Status: FULL CODE Family Communication: No family present at bedside Disposition Plan: Remain Inpatient for continued Diuresis today and pending clinical improvement; Anticipate D/C within 24-48 hours if improved.  Consultants:   Will Consult Cardiology in AM    Procedures:  ECHOCARDIOGRAM ------------------------------------------------------------------- Study Conclusions  - Left ventricle: The cavity size was normal. Wall thickness was   increased in a pattern of mild LVH. Systolic function was normal.   The estimated ejection fraction was in the range of 55% to 60%.   Wall motion was normal; there were no regional wall motion   abnormalities. - Aortic valve: Trileaflet; mildly calcified leaflets. - Right ventricle: The cavity size was mildly dilated. - Pulmonary arteries: Systolic pressure was mildly increased. PA   peak pressure: 35 mm Hg (S). - Pericardium, extracardiac: A trivial pericardial  effusion was   identified.  Antimicrobials:  Anti-infectives (From admission, onward)   None     Subjective: Seen and examined and denied complaints but states she remained dyspneic on Ambulation. No lightheadedness or dizziness. No other concerns or complaints at this time.   Objective: Vitals:   01/14/18 0522 01/14/18 1113 01/14/18 1245 01/14/18 1318  BP: (!) 115/57   124/61  Pulse: 68   84  Resp: 18   18  Temp: 98.1 F (36.7 C)   99.1 F (37.3 C)  TempSrc: Oral   Oral  SpO2: 99% (!) 84% (!) 86% 98%  Weight: (!) 220 kg (485 lb 0.2 oz)     Height:        Intake/Output Summary (Last 24 hours) at 01/14/2018 2130 Last data filed at 01/14/2018 1947 Gross per 24 hour  Intake 653 ml  Output 4800 ml  Net -4147 ml   Filed Weights   01/13/18 0651 01/14/18 0500 01/14/18 0522  Weight: (!) 220.5 kg (486 lb 1.6 oz) (!) 219.1 kg (483 lb 0.5 oz) (!) 220 kg (485 lb 0.2 oz)   Examination: Physical Exam:  Constitutional: WN/WD morbidly obese AAF in NAD but remains dyspneic on ambulation and continues to desaturate  Eyes: Sclerae anicteric. Lids  normal ENMT: External Ears and nose appear normal. MMM Neck: Supple; Difficult to assess JVD Respiratory: Diminished breath sounds with unlabored breathing; No appreciable wheezing/rales/rhonchi. Continues to wear Supplemental O2 Cardiovascular: Distant heart sounds but appeared RRR; Non-pitting LE note Abdomen: Soft, NT, Distended due to body habitus GU: Deferred Musculoskeletal: No contractures; No cyanosis Skin: Warm and Dry; No appreciable rashes or lesions on a limited skin evaluation  Neurologic: CN 2-12 grossly intact. No appreciable focal deficits Psychiatric: Normal and pleasant mood and affect. Intact judgement and insight  Data Reviewed: I have personally reviewed following labs and imaging studies  CBC: Recent Labs  Lab 01/10/18 0632 01/10/18 0639 01/11/18 0045 01/12/18 0930 01/13/18 0447 01/14/18 0505  WBC 5.5  --   8.9 9.8 9.2 5.3  NEUTROABS 3.6  --  8.0* 7.2 7.0 2.7  HGB 11.7* 12.9 12.2 12.3 11.5* 11.7*  HCT 38.2 38.0 39.5 40.0 37.7 39.4  MCV 85.3  --  84.0 85.5 83.8 85.3  PLT 194  --  217 228 195 798   Basic Metabolic Panel: Recent Labs  Lab 01/10/18 0639 01/10/18 1922 01/11/18 0045 01/12/18 0426 01/13/18 0447 01/14/18 0505  NA 142  --  143 141 144 143  K 5.3* 3.8 3.8 4.0 4.1 3.4*  CL 100*  --  103 100* 99* 98*  CO2  --   --  29 31 36* 33*  GLUCOSE 106*  --  139* 131* 120* 96  BUN 11  --  13 19 23* 27*  CREATININE 0.70  --  0.83 0.84 0.85 0.76  CALCIUM  --   --  9.1 8.8* 9.1 8.9  MG  --   --  1.4* 1.8 1.9 1.8  PHOS  --   --  2.8 3.7 4.1 4.2   GFR: Estimated Creatinine Clearance: 158.7 mL/min (by C-G formula based on SCr of 0.76 mg/dL). Liver Function Tests: Recent Labs  Lab 01/13/18 0447 01/14/18 0505  AST 10* 15  ALT 10* 13*  ALKPHOS 31* 34*  BILITOT 0.4 0.7  PROT 7.2 7.0  ALBUMIN 3.9 3.8   No results for input(s): LIPASE, AMYLASE in the last 168 hours. No results for input(s): AMMONIA in the last 168 hours. Coagulation Profile: No results for input(s): INR, PROTIME in the last 168 hours. Cardiac Enzymes: Recent Labs  Lab 01/10/18 1257 01/10/18 1922 01/11/18 0045  TROPONINI <0.03 <0.03 <0.03   BNP (last 3 results) No results for input(s): PROBNP in the last 8760 hours. HbA1C: No results for input(s): HGBA1C in the last 72 hours. CBG: No results for input(s): GLUCAP in the last 168 hours. Lipid Profile: No results for input(s): CHOL, HDL, LDLCALC, TRIG, CHOLHDL, LDLDIRECT in the last 72 hours. Thyroid Function Tests: Recent Labs    01/13/18 0447  TSH 1.992   Anemia Panel: Recent Labs    01/14/18 0505 01/14/18 0511  VITAMINB12  --  387  FOLATE  --  12.0  FERRITIN  --  26  TIBC  --  305  IRON  --  45  RETICCTPCT 1.3  --    Sepsis Labs: No results for input(s): PROCALCITON, LATICACIDVEN in the last 168 hours.  No results found for this or any  previous visit (from the past 240 hour(s)).   Radiology Studies: Dg Chest Port 1 View  Result Date: 01/13/2018 CLINICAL DATA:  sob EXAM: PORTABLE CHEST 1 VIEW COMPARISON:  None. FINDINGS: Stable enlarged cardiac silhouette. Interval improvement in central venous congestion. Small RIGHT effusion. No pneumothorax. IMPRESSION: Cardiomegaly.  Improved central venous congestion. Electronically Signed   By: Suzy Bouchard M.D.   On: 01/13/2018 07:14   Scheduled Meds: . enoxaparin (LOVENOX) injection  55 mg Subcutaneous Q12H  . famotidine  40 mg Oral BID  . folic acid  1 mg Oral Daily  . furosemide  80 mg Intravenous BID  . levothyroxine  100 mcg Oral Q0600  . loratadine  10 mg Oral Daily  . losartan  100 mg Oral Daily  . metoprolol succinate  25 mg Oral Daily  . potassium chloride  40 mEq Oral BID  . predniSONE  10 mg Oral Q breakfast  . sodium chloride flush  3 mL Intravenous Q12H  . cyanocobalamin  1,000 mcg Oral Daily   Continuous Infusions: . sodium chloride      LOS: 3 days   Kerney Elbe, DO [facility] Pager [phone]  If 7PM-7AM, please contact night-coverage [email or web address] Password [email or web address] 01/14/2018, 9:30 PM

## 2018-01-14 NOTE — Plan of Care (Signed)
  Progressing Clinical Measurements: Ability to maintain clinical measurements within normal limits will improve 01/14/2018 1330 - Progressing by Mills Mitton, Scarlett Presto, RN Respiratory complications will improve 01/14/2018 1330 - Progressing by Clois Treanor, Scarlett Presto, RN Cardiovascular complication will be avoided 01/14/2018 1330 - Progressing by Chrys Landgrebe W, RN Activity: Capacity to carry out activities will improve 01/14/2018 1330 - Progressing by Kadie Balestrieri, Scarlett Presto, RN Cardiac: Ability to achieve and maintain adequate cardiopulmonary perfusion will improve 01/14/2018 1330 - Progressing by Darcel Frane, Scarlett Presto, RN   Adequate for Discharge Education: Knowledge of General Education information will improve 01/14/2018 1330 - Adequate for Discharge by Andree Heeg, Scarlett Presto, RN Health Behavior/Discharge Planning: Ability to manage health-related needs will improve 01/14/2018 1330 - Adequate for Discharge by Sudais Banghart, Scarlett Presto, RN Clinical Measurements: Will remain free from infection 01/14/2018 1330 - Adequate for Discharge by Viann Nielson, Scarlett Presto, RN Diagnostic test results will improve 01/14/2018 1330 - Adequate for Discharge by Phillip Maffei, Scarlett Presto, RN Nutrition: Adequate nutrition will be maintained 01/14/2018 1330 - Adequate for Discharge by Aashna Matson, Scarlett Presto, RN Coping: Level of anxiety will decrease 01/14/2018 1330 - Adequate for Discharge by Siena Poehler, Scarlett Presto, RN Elimination: Will not experience complications related to bowel motility 01/14/2018 1330 - Adequate for Discharge by Nitara Szczerba, Scarlett Presto, RN Will not experience complications related to urinary retention 01/14/2018 1330 - Adequate for Discharge by Manley Fason, Scarlett Presto, RN Pain Managment: General experience of comfort will improve 01/14/2018 1330 - Adequate for Discharge by Egor Fullilove, Scarlett Presto, RN Skin Integrity: Risk for impaired skin integrity will decrease 01/14/2018 1330 - Adequate for Discharge by Shoua Ulloa, Scarlett Presto, RN

## 2018-01-14 NOTE — Progress Notes (Signed)
SATURATION QUALIFICATIONS: (This note is used to comply with regulatory documentation for home oxygen)  Patient Saturations on Room Air at Rest = 98%  Patient Saturations on Room Air while Ambulating =  Patient Saturations on 3 Liters of oxygen while Ambulating = 86%  Please briefly explain why patient needs home oxygen:

## 2018-01-15 ENCOUNTER — Encounter (HOSPITAL_COMMUNITY): Payer: Self-pay | Admitting: Cardiology

## 2018-01-15 ENCOUNTER — Inpatient Hospital Stay (HOSPITAL_COMMUNITY): Payer: Medicare HMO

## 2018-01-15 LAB — CBC WITH DIFFERENTIAL/PLATELET
BASOS PCT: 0 %
Basophils Absolute: 0 10*3/uL (ref 0.0–0.1)
Eosinophils Absolute: 0.2 10*3/uL (ref 0.0–0.7)
Eosinophils Relative: 4 %
HEMATOCRIT: 38.6 % (ref 36.0–46.0)
HEMOGLOBIN: 11.8 g/dL — AB (ref 12.0–15.0)
Lymphocytes Relative: 20 %
Lymphs Abs: 1.3 10*3/uL (ref 0.7–4.0)
MCH: 25.8 pg — ABNORMAL LOW (ref 26.0–34.0)
MCHC: 30.6 g/dL (ref 30.0–36.0)
MCV: 84.3 fL (ref 78.0–100.0)
MONOS PCT: 12 %
Monocytes Absolute: 0.8 10*3/uL (ref 0.1–1.0)
NEUTROS ABS: 4.2 10*3/uL (ref 1.7–7.7)
Neutrophils Relative %: 64 %
Platelets: 182 10*3/uL (ref 150–400)
RBC: 4.58 MIL/uL (ref 3.87–5.11)
RDW: 18.6 % — ABNORMAL HIGH (ref 11.5–15.5)
WBC: 6.6 10*3/uL (ref 4.0–10.5)

## 2018-01-15 LAB — COMPREHENSIVE METABOLIC PANEL
ALBUMIN: 3.8 g/dL (ref 3.5–5.0)
ALK PHOS: 34 U/L — AB (ref 38–126)
ALT: 14 U/L (ref 14–54)
AST: 16 U/L (ref 15–41)
Anion gap: 10 (ref 5–15)
BILIRUBIN TOTAL: 0.6 mg/dL (ref 0.3–1.2)
BUN: 25 mg/dL — AB (ref 6–20)
CALCIUM: 8.9 mg/dL (ref 8.9–10.3)
CO2: 34 mmol/L — AB (ref 22–32)
Chloride: 99 mmol/L — ABNORMAL LOW (ref 101–111)
Creatinine, Ser: 0.74 mg/dL (ref 0.44–1.00)
GFR calc Af Amer: >60 mL/min (ref >60)
GFR calc non Af Amer: >60 mL/min (ref >60)
GLUCOSE: 107 mg/dL — AB (ref 65–99)
Potassium: 3.9 mmol/L (ref 3.5–5.1)
Sodium: 143 mmol/L (ref 135–145)
TOTAL PROTEIN: 7 g/dL (ref 6.5–8.1)

## 2018-01-15 LAB — GLUCOSE, CAPILLARY: Glucose-Capillary: 138 mg/dL — ABNORMAL HIGH (ref 65–99)

## 2018-01-15 LAB — PHOSPHORUS: Phosphorus: 3.6 mg/dL (ref 2.5–4.6)

## 2018-01-15 LAB — MAGNESIUM: Magnesium: 2.1 mg/dL (ref 1.7–2.4)

## 2018-01-15 MED ORDER — GUAIFENESIN ER 600 MG PO TB12
1200.0000 mg | ORAL_TABLET | Freq: Two times a day (BID) | ORAL | Status: DC
  Administered 2018-01-15 – 2018-01-17 (×5): 1200 mg via ORAL
  Filled 2018-01-15 (×5): qty 2

## 2018-01-15 MED ORDER — IOPAMIDOL (ISOVUE-370) INJECTION 76%
100.0000 mL | Freq: Once | INTRAVENOUS | Status: AC | PRN
  Administered 2018-01-15: 100 mL via INTRAVENOUS

## 2018-01-15 MED ORDER — IOPAMIDOL (ISOVUE-370) INJECTION 76%
INTRAVENOUS | Status: AC
  Administered 2018-01-15: 100 mL via INTRAVENOUS
  Filled 2018-01-15: qty 100

## 2018-01-15 NOTE — Care Management Important Message (Signed)
Important Message  Patient Details  Name: ANEESHA HOLLORAN MRN: [id number] Date of Birth: 26-Jan-1966   Medicare Important Message Given:  Yes    Kerin Salen 01/15/2018, 11:18 AMImportant Message  Patient Details  Name: ARMONI KLUDT MRN: [id number] Date of Birth: [birth year]   Medicare Important Message Given:  Yes    Kerin Salen 01/15/2018, 11:18 AM

## 2018-01-15 NOTE — Consult Note (Addendum)
Cardiology Consultation:   Patient ID: [name]; [id number]; [birth year]   Admit date: 01/10/2018 Date of Consult: 01/15/2018  Primary Care Provider: [name], Macias Primary Cardiologist: Kim Furbish, [name]    Patient Profile:   [name] is a 52 y.o. female with a hx of anemia, diastolic heart failure, chronic respiratory failure on 2 L home oxygen, obesity hypoventilation syndrome, OSA on CPAP, morbid obesity, GERD, hypertension, hypothyroidism, sarcoidosis was admitted to [facility] long hospital who is being seen today for the evaluation of CHF and Vtach at the request of Dr. Alfredia Macias.  History of Present Illness:   [name] was admitted to [facility] long hospital on 01/10/18 with a 3-week history of increased shortness of breath, associated dry cough, subjective fevers, chills and hypoxia on ambulation and worsening leg edema.  On presentation the patient was tachypneic, hypoxic with oxygen saturation of 88% on 2 L, chest x-ray showing interstitial edema, EKG without ischemic changes.  She was admitted for decompensated diastolic heart failure.  She has been diuresed with Lasix initially 60 mg twice daily and increased to 80 mg twice daily.  She has been having some runs of wide complex rhythm.  Cardiology was consulted for further evaluation and recommendations for management of CHF and what was thought by the primary team to be V. Tach.  The patient was hyperkalemic on presentation with a potassium of 5.3.  Magnesium was 1.4.  Electrolytes have been managed and currently potassium is 3.9 and magnesium 2.1.  BNP on 01/14/18 was 11.1.  Troponins have been negative x3.  On my evaluation that patient says that she has had progressive DOE with walking, increased lower extremity edema, increased wt of 15-18 lbs and decrease O2 sats into the 70's with walking. She felt that her breathing was taking longer to recover after walking than usual. She reports that she was taken less  lasix than ordered due to brisk urine output. SHe was ordered 40 mg bid, but was taking 20 mg bid. She states compliance with CPAP. She has her usual chest discomfort that she relates to pulmonary sarcoid, no change.   Patient was last seen in our office on 07/15/2015 by [name] at which time [name] noted dyspnea secondary to morbid obesity and obesity hypoventilation syndrome.  She had normal ejection fraction and normal BNP at the time.  He recommended continuing current maintenance medication for treatment of blood pressure and Lasix.  Possible right heart cath was discussed however [name] felt that her pulmonary pressures were likely elevated secondary to underlying body habitus and further knowledge would not change medical management.  Past Medical History:  Diagnosis Date  . Anemia   . Arthritis    hands, shoulders, no meds  . CHF (congestive heart failure) (HCC)    EF60-65%  . Chronic hyperventilation syndrome    w/ obesity tx with albuterol inhaler and oxygen 2L  . COPD (chronic obstructive pulmonary disease) ([place])    uses oxygen 2 L  . Gallstones   . GERD (gastroesophageal reflux disease)    diet controlled - no meds  . H/O hiatal hernia   . Hypertension   . Hypothyroidism   . Kidney stones   . Morbid obesity ([place])   . Pneumonia    hoispitalized in 08/2011  . Sarcoidosis   . Seasonal allergies   . Shortness of breath    with exertion   . Sleep apnea    uses CPAP machine     Past  Surgical History:  Procedure Laterality Date  . Man   x 1  . CHOLECYSTECTOMY  2000  . DILATION AND CURETTAGE OF UTERUS N/A 08/09/2016   Procedure: DILATATION AND CURETTAGE;  Surgeon: Kim Amber, [name];  Location: [facility];  Service: Gynecology;  Laterality: N/A;  . HYSTEROSCOPY W/D&C  12/27/2011   Procedure: DILATATION AND CURETTAGE /HYSTEROSCOPY;  Surgeon: Kim Sarah. Landry Mellow, [name];  Location: [facility] [facility];  Service: Gynecology;;  . I and D of abcess  05/2011  . INTRAUTERINE DEVICE  (IUD) INSERTION N/A 08/09/2016   Procedure: INTRAUTERINE DEVICE (IUD) INSERTION;  Surgeon: Kim Amber, [name];  Location: [facility];  Service: Gynecology;  Laterality: N/A;  . LUNG BIOPSY    . uterine abletion       Home Medications:  Prior to Admission medications   Medication Sig Start Date End Date Taking? Authorizing Provider  albuterol (PROAIR HFA) 108 (90 Base) MCG/ACT inhaler Inhale 1-2 puffs into the lungs every 6 hours as needed for wheezing. 01/08/18  Yes [name]  cetirizine (ZYRTEC) 10 MG tablet Take 10 mg by mouth at bedtime as needed for allergies.   Yes Provider, Historical, [name]  cholecalciferol (VITAMIN D) 400 UNITS TABS tablet Take 400 Units by mouth daily.   Yes Provider, Historical, [name]  Coral Calcium 1000 (390 CA) MG TABS Take 1,000 mg by mouth daily.    Yes Provider, Historical, [name]  famotidine (PEPCID) 40 MG tablet Take 1 tablet (40 mg total) by mouth 2 (two) times daily. 02/16/17  Yes Kim Dubois, [name]  folic acid (FOLVITE) 1 MG tablet Take 1 mg by mouth daily. 01/11/16  Yes Provider, Historical, [name]  furosemide (LASIX) 20 MG tablet Take 3 tablets (60 mg total) by mouth 2 (two) times daily. 02/16/17  Yes Kim Dubois, [name]  HYDROcodone-acetaminophen [facility]) 10-325 MG tablet Take 1 tablet by mouth every 6 (six) hours as needed (pain). 11/21/16  Yes Kim Pelt, PA-C  levothyroxine (SYNTHROID, LEVOTHROID) 100 MCG tablet TAKE 1 TABLET (100 MCG TOTAL) BY MOUTH DAILY AT 6 (SIX) AM. 09/27/17  Yes [name]  losartan (COZAAR) 100 MG tablet Take 1 tablet (100 mg total) daily by mouth. 09/13/17  Yes [name]  methotrexate 50 MG/2ML injection Inject 15 mg into the skin every Friday. Injects 0.64ml. 12/13/15  Yes Provider, Historical, [name]  metoprolol succinate (TOPROL-XL) 25 MG 24 hr tablet TAKE 1 TABLET BY MOUTH EVERY DAY 11/22/17  Yes [name]  NON FORMULARY 2 liter of oxygen   Yes Provider, Historical, [name]  predniSONE (DELTASONE) 10 MG tablet Take 6  tablets by mouth daily X 1 day; take 4 tablets daily X 2 days; take 2 tablets by mouth daily x 3 days; then resume 10 mg daily. Patient taking differently: Take 10 mg by mouth daily.  02/16/17  Yes Kim Dubois, [name]  triamcinolone acetonide (KENALOG) 40 MG/ML injection Inject 40 mg into the muscle every 3 (three) months. For knee pain   Yes Provider, Historical, [name]  triamcinolone ointment (KENALOG) 0.1 % Apply 1 application topically See admin instructions. Applies twice a day as needed for break out of Sarcoidosis   Yes Provider, Historical, [name]  vitamin B-12 1000 MCG tablet Take 1 tablet (1,000 mcg total) by mouth daily. 06/19/16  Yes Lavina Hamman, [name]    Inpatient Medications: Scheduled Meds: . enoxaparin (LOVENOX) injection  55 mg Subcutaneous Q12H  . famotidine  40 mg Oral BID  . folic acid  1  mg Oral Daily  . furosemide  80 mg Intravenous BID  . guaiFENesin  1,200 mg Oral BID  . levothyroxine  100 mcg Oral Q0600  . loratadine  10 mg Oral Daily  . losartan  100 mg Oral Daily  . metoprolol succinate  25 mg Oral Daily  . potassium chloride  40 mEq Oral BID  . predniSONE  10 mg Oral Q breakfast  . sodium chloride flush  3 mL Intravenous Q12H  . cyanocobalamin  1,000 mcg Oral Daily   Continuous Infusions: . sodium chloride     PRN Meds: sodium chloride, acetaminophen, albuterol, hydrALAZINE, HYDROcodone-acetaminophen, ondansetron (ZOFRAN) IV, sodium chloride flush  Allergies:   No Known Allergies  Social History:   Social History   Socioeconomic History  . Marital status: Single    Spouse name: Not on file  . Number of children: Not on file  . Years of education: Not on file  . Highest education level: Not on file  Social Needs  . Financial resource strain: Not on file  . Food insecurity - worry: Not on file  . Food insecurity - inability: Not on file  . Transportation needs - medical: Not on file  . Transportation needs - non-medical: Not on file  Occupational History    . Occupation: unemployeed  Tobacco Use  . Smoking status: Never Smoker  . Smokeless tobacco: Never Used  Substance and Sexual Activity  . Alcohol use: Yes    Comment: occasionally  . Drug use: No  . Sexual activity: No    Birth control/protection: None  Other Topics Concern  . Not on file  Social History Narrative   Is not currently working.    Divorced for eight or nine years   Has one son who lives around here.     Family History:    Family History  Problem Relation Age of Onset  . Diabetes Father   . Cancer Father 53       colon cancer   . Diabetes Brother   . Deep vein thrombosis Mother   . Aneurysm Sister        d/o brain aneurysm     ROS:  Please see the history of present illness.   All other ROS reviewed and negative.     Physical Exam/Data:   Vitals:   01/14/18 1318 01/14/18 2159 01/15/18 0609 01/15/18 0611  BP: 124/61 (!) 144/72 (!) 131/55   Pulse: 84 78 75   Resp: 18 16 16    Temp: 99.1 F (37.3 C) 98.1 F (36.7 C) 98 F (36.7 C)   TempSrc: Oral Oral Oral   SpO2: 98% 98% 96%   Weight:    (!) 473 lb 12.8 oz (214.9 kg)  Height:        Intake/Output Summary (Last 24 hours) at 01/15/2018 1100 Last data filed at 01/15/2018 0600 Gross per 24 hour  Intake 1010 ml  Output 5450 ml  Net -4440 ml   Filed Weights   01/14/18 0500 01/14/18 0522 01/15/18 0611  Weight: (!) 483 lb 0.5 oz (219.1 kg) (!) 485 lb 0.2 oz (220 kg) (!) 473 lb 12.8 oz (214.9 kg)   Body mass index is 78.84 kg/m.  General: Very obese female, in no acute distress HEENT: normal Lymph: no adenopathy Neck: no JVD Endocrine:  No thryomegaly Vascular: No carotid bruits; FA pulses 2+ bilaterally without bruits  Cardiac:  normal S1, S2; RRR; no murmur  Lungs:  clear to auscultation bilaterally, no wheezing, rhonchi  or rales  Abd: soft, nontender, no hepatomegaly  Ext: 1+ lower leg edema Musculoskeletal:  No deformities, BUE and BLE strength normal and equal Skin: warm and dry   Neuro:  CNs 2-12 intact, no focal abnormalities noted Psych:  Normal affect   EKG:  The EKG was personally reviewed and demonstrates:  NSR 73 bpm, no acute changes Telemetry:  Telemetry was personally reviewed and demonstrates:  Sinus rhythm in the 70's, no arrhythias  Relevant CV Studies:  Echocardiogram 01/11/18 Study Conclusions - Left ventricle: The cavity size was normal. Wall thickness was   increased in a pattern of mild LVH. Systolic function was normal.   The estimated ejection fraction was in the range of 55% to 60%.   Wall motion was normal; there were no regional wall motion   abnormalities. - Aortic valve: Trileaflet; mildly calcified leaflets. - Right ventricle: The cavity size was mildly dilated. - Pulmonary arteries: Systolic pressure was mildly increased. PA   peak pressure: 35 mm Hg (S). - Pericardium, extracardiac: A trivial pericardial effusion was   identified.   Laboratory Data:  Chemistry Recent Labs  Lab 01/13/18 0447 01/14/18 0505 01/15/18 0444  NA 144 143 143  K 4.1 3.4* 3.9  CL 99* 98* 99*  CO2 36* 33* 34*  GLUCOSE 120* 96 107*  BUN 23* 27* 25*  CREATININE 0.85 0.76 0.74  CALCIUM 9.1 8.9 8.9  GFRNONAA >60 >60 >60  GFRAA >60 >60 >60  ANIONGAP 9 12 10     Recent Labs  Lab 01/13/18 0447 01/14/18 0505 01/15/18 0444  PROT 7.2 7.0 7.0  ALBUMIN 3.9 3.8 3.8  AST 10* 15 16  ALT 10* 13* 14  ALKPHOS 31* 34* 34*  BILITOT 0.4 0.7 0.6   Hematology Recent Labs  Lab 01/13/18 0447 01/14/18 0505 01/15/18 0444  WBC 9.2 5.3 6.6  RBC 4.50 4.62  4.62 4.58  HGB 11.5* 11.7* 11.8*  HCT 37.7 39.4 38.6  MCV 83.8 85.3 84.3  MCH 25.6* 25.3* 25.8*  MCHC 30.5 29.7* 30.6  RDW 18.5* 18.7* 18.6*  PLT 195 181 182   Cardiac Enzymes Recent Labs  Lab 01/10/18 1257 01/10/18 1922 01/11/18 0045  TROPONINI <0.03 <0.03 <0.03    Recent Labs  Lab 01/10/18 0637  TROPIPOC 0.00    BNP Recent Labs  Lab 01/14/18 0511  BNP 11.1    DDimer No results  for input(s): DDIMER in the last 168 hours.  Radiology/Studies:  Dg Chest Port 1 View  Result Date: 01/15/2018 CLINICAL DATA:  Shortness of Breath EXAM: PORTABLE CHEST 1 VIEW COMPARISON:  January 13, 2018 FINDINGS: There is no edema or consolidation. There is cardiomegaly with pulmonary venous hypertension. No adenopathy. No bone lesions. IMPRESSION: Pulmonary vascular congestion. No edema or consolidation. Degree of cardiomegaly is stable. Electronically Signed   By: Lowella Grip III M.D.   On: 01/15/2018 08:27   Dg Chest Port 1 View  Result Date: 01/13/2018 CLINICAL DATA:  sob EXAM: PORTABLE CHEST 1 VIEW COMPARISON:  None. FINDINGS: Stable enlarged cardiac silhouette. Interval improvement in central venous congestion. Small RIGHT effusion. No pneumothorax. IMPRESSION: Cardiomegaly.  Improved central venous congestion. Electronically Signed   By: Suzy Bouchard M.D.   On: 01/13/2018 07:14    Assessment and Plan:   Acute on chronic diastolic heart failure -BNP is 11.1, however this is possibly under represented in setting of morbid obesity.  Troponins were negative x3 -Pt ordered 60 mg BID at home but admits that she was only taking 20  mg bid and sometimes only once per day. Likely needs the increased lasix dosing.  -Echocardiogram was done on 01/11/18 and showed mild LVH with normal LV systolic function, EF 09-32%, no regional wall motion abnormalities, mildly dilated RV, peak PA pressure 35 mmHg and trivial pericardial effusion -The patient is continued on her home metoprolol XL 25 mg daily and losartan 100 mg daily.  -She has been placed on 1500 mL fluid restriction now reduced to 1200 mL.  -Strict intake and output and daily weights have been ordered -Patient is being diuresed with IV lasix,  increased from 60 mg twice daily to 80 mg twice daily weight is down -Weights have been fluctuating, likely not very accurate given her size. -There has been documented 5.4 L of urine output in  the last 24 hours and fluid status is net -11.9 L since admission.  -Chest x-ray shows improved central venous congestion. -The patient seems to be improving on the current therapy and no further cardiac recommendations at this time.   Nonsustained V. Tach -The saved telemetry strips that I see appear are artifact as there are underlying narrow QRS complexes at the predicted intervals for the previous rhythm.   Acute on chronic respiratory failure -Followed by pulmonology medicine, Dr. Elsworth Soho as well as Sallee Provencal, her primary provider.  History of sarcoidosis on home oxygen 2 L per nasal cannula -Patient with obesity related hypoventilation syndrome and obstructive sleep apnea on CPAP with questionable compliance. Hypoxia with ambulation noted-on admission. -Per primary team patient is on steroids, nebulizer, increased oxygen  Hyperkalemia -Potassium 5.3 on admission, Magnesium was 1.4.  Electrolytes have been managed and currently potassium is 3.9 and magnesium 2.1.   Obesity Body mass index is 78.84 kg/m. Likely contributing to her current issues.   GERD -Stable on famotidine 40 mg p.o. twice daily  Sarcoidosis -As above  Normocytic anemia -Evaluation per primary team   For questions or updates, please contact [place] Please consult [email or web address] for contact info under Cardiology/STEMI.   Signed, Daune Perch, Macias  01/15/2018 11:00 AM   History and all data above reviewed.  Patient examined.  I agree with the findings as above.  The patient has been getting more SOB slowly for weeks.  She actually went to [facility] in [facility] but left before being seen.  She finally came in when her oxygen sats were consistently falling into the 80s even on her continuous O2.  She reported some sats of 70s.  She had increased leg edema.  She was not having chest pain.  Of note she reduced her Lasix despite the fluid gain because she is on the phone as a tax preparer and she did not have the  time to urinate frequently.  She eats about half of her meals out and she has her feet on the ground quite a bit.     The patient presents with acute on chronic diastolic HF.  The patient exam reveals COR:RRR  ,  Lungs: Decreased breath sounds  ,  Abd: Morbid obesity, Ext Severe edema  .  All available labs, radiology testing, previous records reviewed. Agree with documented assessment and plan. Wide Complex:  Artifact.  No Vtach.  No further work up.  Acute on chronic diastolic HF:  It looks like she is having a good response to current diuresis. I would continue this.  She needs higher dose diuretic at home.  She needs salt restriction.     [name]  1:56 PM  01/15/2018

## 2018-01-15 NOTE — Progress Notes (Signed)
PROGRESS NOTE    [name]  HWE:[id number] DOB: 10-Apr-1966 DOA: 01/10/2018 PCP: [name], NP   Brief Narrative:  [name] is a 52 y.o. female with medical history significant of anemia, diastolic heart failure , chronic respiratory failure on 2 lit [place], OHS, OSA on CPAP, Morbid Obesity, GERD, Hypertension, Hypothyroidism, Sarcoidosis who comes in for worsening Shortness of Breath for the last 3 weeks, associated with dry cough, subjective fevers, chills and hypoxia on ambulation and worsening leg edema.   She reported some chest discomfort, non radiating and associated with ambulation. Some nausea today, no vomiting, . She reported some loose bowel movements two days ago, which has resolved. No hematochezia, no hemoptysis, no syncope, no headache or dizziness, no sensory deficits.   On arrival to ED, she was tachypneic, hypoxic with sats at 88% on 2 Liters on ambulation. Pt reports on ambulation at home, her sats have dropped to 70%. She also reports non compliance to fluid and diet and reports missing her prednisone and lasix over the last 4 weeks. CXR showed some interstitial edema.  EKG did not show any ischemic changes. She was referred to medical service for admission and is currently being treated for Acute Decompensation of Chronic Diastolic CHF. IV Lasix increased from 60 mg BID to 80 mg BID yesterday as patient still remains dyspneic with Ambulation. She also started having some runs of VTACH. Will replete electrolytes and consult Cardiology in AM for further evaluation and recommendations of CHF and VTACH.   Cardiology evaluated and recommending continuing Diuresis at current dose and felt that patient's [facility] was not true [facility] and was artifact. Because patient remains dyspenic on exertion will obtain a CTA of Chest to evaluate Lungs and r/o PE.    Assessment & Plan:   Active Problems:   OSA (obstructive sleep apnea)   Obesity hypoventilation syndrome  (HCC)   Hypertension   Sarcoidosis   Iron deficiency anemia due to chronic blood loss   Hypothyroidism   Acute on chronic diastolic CHF (congestive heart failure) (HCC)   Congestive heart failure (HCC)   Anxiety   Chronic respiratory failure (HCC)   Diastolic dysfunction with acute on chronic heart failure (HCC)   GERD (gastroesophageal reflux disease)   CHF (congestive heart failure) (HCC)   Diastolic CHF ([place])  Acute on Chronic Respiratory Failure with Hypoxia 2/2 to Acute on Chronic Diastolic Heart Failure.  -Admitted to Telemetry.  -Checked BNP and was 11.1 -C/w IV Lasix 80 mg BID. Upon further discussion with the patient she did not take her 60 mg po Lasix BID as she should have and did not comply to a low salt diet  -C/w Metoprolol XL 25 mg po Daily and Losartan 100 mg po Daily  -Echocardiogram done and showed The estimated ejection fraction was in the range of 55% to 60%. Wall motion was normal; there were no regional wall motion abnormalities. The PA Pressure was 35 -C/w Daily Weights, and Strict Intake and Output.  -C/w Heart Healthy Diet and 1500 mL Fluid Restriction reduced to 1200 mL -Troponin were <0.03 x3 -Patient is -12.561 mL; Weight is down from 505 to 473 lbs but nursing states Weights may not be acurated  -Continue to Monitor Volume Status Carefully -Repeat Ambulatory Pulse Oximetry Screen yesterday AM showed she desaturated on even 2 and 3 Liters  -C/w Diuresis as above -Repeat CXR this AM showed Pulmonary vascular congestion. No edema or consolidation. Degree of cardiomegaly is stable. -Will obtain CTA of  Chest to evaluate Lungs and r/o PE -Will consult Cardiology for further evaluation and Recommendations   Non-sustained VTACH, Was not VTACH but Artifacat -Repleted Electrolyte -Repeat EKG showed NSR -Appreciated Cardiology Evaluation   OSA on CPAP at Night  -Suspect Noncompliance -C/w CPAP while hospitalized continually declines   Chronic Rrespiratory  Failure on 2 Liters of [place] oxygen 2/2 to OHS, OSA, Sarcoidosis -Follows up with Dr. Elsworth Soho as outpatient.  -Increased her Prednisone from 10 mg daily to 40 mg daily x3 days and and taper quickly over the next week.  -C/w Albuterol Neb 2.5 mg Neb q4hprn Wheezing or SOB -Repeat Ambulatory Pulse Oximetry Screening in AM -Repeat CXR done this AM as above   -Obtain CTA of Chest to evaluate Lungs and r/o PE given continued Desaturations on Ambulation   Hypothyroidism -Checked TSH and was 1.992 -C/w Levothyroxine 100 mcg po Daily   Hypertension -C/w Losartan 100 mg po Daily and Metoprolol XL 25 mg po Daily -Add Hydralazine 10 mg IV q6hprn for SBP >180 or DBP>100  Hyperkalemia, improved and -> Hypokalemia -Improved. K+ went from 5.3 -> 4.1 -> 3.4 -> 3.9 -Replete -Continue to Monitor and Repeat CMP in AM   GERD -Stable. -C/w Famotidine 40 mg po BID  Sarcoidosis -As Above -Given Prednisone 40 mg po Daily x3 days and then changed back to 10 mg po Daily   Hypomagnesemia -Mag Level was 1.4 and improved to 2.1 -Continue to Monitor and Replete as Necessary -Repeat Mag Level in AM  Normocytic Anemia -Patient's Hb/Hct went from 12.3/40.0 -> 11.5/37.7 -> 11.7/39.4 -> 11.8/38.6 -Has Hx of IDA -Anemia Panel showed Iron Level of 45, UIBC of 260, TIBC of 305, 15 Saturation Ratios, Ferritin of 26, Folate of 12.0, and Vitamin B12 of 387 -Continue to Monitor for S/Sx of Bleeding -Repeat CBC in AM   DVT prophylaxis: Enoxaparin 55 mg q12h  Code Status: FULL CODE Family Communication: No family present at bedside Disposition Plan: Remain Inpatient for continued Diuresis today and pending clinical improvement; Anticipate D/C within 24-48 hours if improved and cleared by Cardiology.  Consultants:   Cardiology Dr. Percival Spanish   Discussed Case with Pulmonary Dr. Chesley Mires   Procedures:  ECHOCARDIOGRAM ------------------------------------------------------------------- Study  Conclusions  - Left ventricle: The cavity size was normal. Wall thickness was   increased in a pattern of mild LVH. Systolic function was normal.   The estimated ejection fraction was in the range of 55% to 60%.   Wall motion was normal; there were no regional wall motion   abnormalities. - Aortic valve: Trileaflet; mildly calcified leaflets. - Right ventricle: The cavity size was mildly dilated. - Pulmonary arteries: Systolic pressure was mildly increased. PA   peak pressure: 35 mm Hg (S). - Pericardium, extracardiac: A trivial pericardial effusion was   identified.  Antimicrobials:  Anti-infectives (From admission, onward)   None     Subjective: Seen and examined and still felt ok and felt SOB when walking. Nursing reports she was weighed on a standing scale today and not in bed. No CP. No lightheadedness or dizziness.   Objective: Vitals:   01/14/18 1318 01/14/18 2159 01/15/18 0609 01/15/18 0611  BP: 124/61 (!) 144/72 (!) 131/55   Pulse: 84 78 75   Resp: 18 16 16    Temp: 99.1 F (37.3 C) 98.1 F (36.7 C) 98 F (36.7 C)   TempSrc: Oral Oral Oral   SpO2: 98% 98% 96%   Weight:    (!) 214.9 kg (473 lb 12.8 oz)  Height:        Intake/Output Summary (Last 24 hours) at 01/15/2018 1427 Last data filed at 01/15/2018 1300 Gross per 24 hour  Intake 1010 ml  Output 4250 ml  Net -3240 ml   Filed Weights   01/14/18 0500 01/14/18 0522 01/15/18 0611  Weight: (!) 219.1 kg (483 lb 0.5 oz) (!) 220 kg (485 lb 0.2 oz) (!) 214.9 kg (473 lb 12.8 oz)   Examination: Physical Exam:  Constitutional: WN/WD morbidly obese AAF sitting in chair bedside and appeared calm Eyes: Sclerae anicteric; Lids Normal ENMT: External Ears and nose appear normal Neck: Supple with no JVD Respiratory: Diminished breath sounds but unlabored breathing no appreciable wheezing/rales/rhonchi. Wearing supplemental O2 via [place] Cardiovascular: Distant Heart Sounds but appeared RRR: No appreciable m/r/g. Has Some  edema Abdomen: Soft, NT Distended due to body habitus. Bowel sounds present GU: Deferred Musculoskeletal: No contractures; No cyanosis Skin: Warm and Dry; No appreciable rashes or lesions on a limited skin eval Neurologic: CN 2-12 grossly intact; No appreciable focal deficits Psychiatric: Normal mood and affect. Intact judgement and insight  Data Reviewed: I have personally reviewed following labs and imaging studies  CBC: Recent Labs  Lab 01/11/18 0045 01/12/18 0930 01/13/18 0447 01/14/18 0505 01/15/18 0444  WBC 8.9 9.8 9.2 5.3 6.6  NEUTROABS 8.0* 7.2 7.0 2.7 4.2  HGB 12.2 12.3 11.5* 11.7* 11.8*  HCT 39.5 40.0 37.7 39.4 38.6  MCV 84.0 85.5 83.8 85.3 84.3  PLT 217 228 195 181 315   Basic Metabolic Panel: Recent Labs  Lab 01/11/18 0045 01/12/18 0426 01/13/18 0447 01/14/18 0505 01/15/18 0444  NA 143 141 144 143 143  K 3.8 4.0 4.1 3.4* 3.9  CL 103 100* 99* 98* 99*  CO2 29 31 36* 33* 34*  GLUCOSE 139* 131* 120* 96 107*  BUN 13 19 23* 27* 25*  CREATININE 0.83 0.84 0.85 0.76 0.74  CALCIUM 9.1 8.8* 9.1 8.9 8.9  MG 1.4* 1.8 1.9 1.8 2.1  PHOS 2.8 3.7 4.1 4.2 3.6   GFR: Estimated Creatinine Clearance: 156.1 mL/min (by C-G formula based on SCr of 0.74 mg/dL). Liver Function Tests: Recent Labs  Lab 01/13/18 0447 01/14/18 0505 01/15/18 0444  AST 10* 15 16  ALT 10* 13* 14  ALKPHOS 31* 34* 34*  BILITOT 0.4 0.7 0.6  PROT 7.2 7.0 7.0  ALBUMIN 3.9 3.8 3.8   No results for input(s): LIPASE, AMYLASE in the last 168 hours. No results for input(s): AMMONIA in the last 168 hours. Coagulation Profile: No results for input(s): INR, PROTIME in the last 168 hours. Cardiac Enzymes: Recent Labs  Lab 01/10/18 1257 01/10/18 1922 01/11/18 0045  TROPONINI <0.03 <0.03 <0.03   BNP (last 3 results) No results for input(s): PROBNP in the last 8760 hours. HbA1C: No results for input(s): HGBA1C in the last 72 hours. CBG: Recent Labs  Lab 01/15/18 1233  GLUCAP 138*   Lipid  Profile: No results for input(s): CHOL, HDL, LDLCALC, TRIG, CHOLHDL, LDLDIRECT in the last 72 hours. Thyroid Function Tests: Recent Labs    01/13/18 0447  TSH 1.992   Anemia Panel: Recent Labs    01/14/18 0505 01/14/18 0511  VITAMINB12  --  387  FOLATE  --  12.0  FERRITIN  --  26  TIBC  --  305  IRON  --  45  RETICCTPCT 1.3  --    Sepsis Labs: No results for input(s): PROCALCITON, LATICACIDVEN in the last 168 hours.  No results found for this or  any previous visit (from the past 240 hour(s)).   Radiology Studies: Dg Chest Port 1 View  Result Date: 01/15/2018 CLINICAL DATA:  Shortness of Breath EXAM: PORTABLE CHEST 1 VIEW COMPARISON:  January 13, 2018 FINDINGS: There is no edema or consolidation. There is cardiomegaly with pulmonary venous hypertension. No adenopathy. No bone lesions. IMPRESSION: Pulmonary vascular congestion. No edema or consolidation. Degree of cardiomegaly is stable. Electronically Signed   By: Lowella Grip III M.D.   On: 01/15/2018 08:27   Scheduled Meds: . enoxaparin (LOVENOX) injection  55 mg Subcutaneous Q12H  . famotidine  40 mg Oral BID  . folic acid  1 mg Oral Daily  . furosemide  80 mg Intravenous BID  . guaiFENesin  1,200 mg Oral BID  . levothyroxine  100 mcg Oral Q0600  . loratadine  10 mg Oral Daily  . losartan  100 mg Oral Daily  . metoprolol succinate  25 mg Oral Daily  . potassium chloride  40 mEq Oral BID  . predniSONE  10 mg Oral Q breakfast  . sodium chloride flush  3 mL Intravenous Q12H  . cyanocobalamin  1,000 mcg Oral Daily   Continuous Infusions: . sodium chloride      LOS: 4 days   Kerney Elbe, DO [facility] Pager [phone]  If 7PM-7AM, please contact night-coverage [email or web address] Password [facility] 01/15/2018, 2:27 PM

## 2018-01-16 ENCOUNTER — Encounter (HOSPITAL_COMMUNITY): Payer: Self-pay

## 2018-01-16 LAB — COMPREHENSIVE METABOLIC PANEL
ALBUMIN: 4 g/dL (ref 3.5–5.0)
ALT: 17 U/L (ref 14–54)
AST: 15 U/L (ref 15–41)
Alkaline Phosphatase: 34 U/L — ABNORMAL LOW (ref 38–126)
Anion gap: 12 (ref 5–15)
BUN: 26 mg/dL — AB (ref 6–20)
CHLORIDE: 96 mmol/L — AB (ref 101–111)
CO2: 34 mmol/L — ABNORMAL HIGH (ref 22–32)
Calcium: 9.5 mg/dL (ref 8.9–10.3)
Creatinine, Ser: 0.82 mg/dL (ref 0.44–1.00)
GFR calc Af Amer: >60 mL/min (ref >60)
GFR calc non Af Amer: >60 mL/min (ref >60)
GLUCOSE: 97 mg/dL (ref 65–99)
POTASSIUM: 4 mmol/L (ref 3.5–5.1)
SODIUM: 142 mmol/L (ref 135–145)
Total Bilirubin: 0.8 mg/dL (ref 0.3–1.2)
Total Protein: 7.1 g/dL (ref 6.5–8.1)

## 2018-01-16 LAB — CBC WITH DIFFERENTIAL/PLATELET
BASOS ABS: 0 10*3/uL (ref 0.0–0.1)
BASOS PCT: 0 %
EOS ABS: 0.2 10*3/uL (ref 0.0–0.7)
EOS PCT: 3 %
HCT: 39.4 % (ref 36.0–46.0)
Hemoglobin: 11.6 g/dL — ABNORMAL LOW (ref 12.0–15.0)
Lymphocytes Relative: 22 %
Lymphs Abs: 1.5 10*3/uL (ref 0.7–4.0)
MCH: 25.3 pg — ABNORMAL LOW (ref 26.0–34.0)
MCHC: 29.4 g/dL — AB (ref 30.0–36.0)
MCV: 85.8 fL (ref 78.0–100.0)
MONO ABS: 0.8 10*3/uL (ref 0.1–1.0)
Monocytes Relative: 11 %
NEUTROS ABS: 4.5 10*3/uL (ref 1.7–7.7)
Neutrophils Relative %: 64 %
PLATELETS: 184 10*3/uL (ref 150–400)
RBC: 4.59 MIL/uL (ref 3.87–5.11)
RDW: 19 % — AB (ref 11.5–15.5)
WBC: 7 10*3/uL (ref 4.0–10.5)

## 2018-01-16 LAB — PHOSPHORUS: Phosphorus: 3.7 mg/dL (ref 2.5–4.6)

## 2018-01-16 LAB — MAGNESIUM: Magnesium: 2 mg/dL (ref 1.7–2.4)

## 2018-01-16 NOTE — Progress Notes (Signed)
PROGRESS NOTE    [name]  PNT:[id number] DOB: Jul 08, 1966 DOA: 01/10/2018 PCP: [name], NP   Brief Narrative:  [name] is a 52 y.o. female with medical history significant of anemia, diastolic heart failure , chronic respiratory failure on 2 lit [place], OHS, OSA on CPAP, Morbid Obesity, GERD, Hypertension, Hypothyroidism, Sarcoidosis who comes in for worsening Shortness of Breath for the last 3 weeks, associated with dry cough, subjective fevers, chills and hypoxia on ambulation and worsening leg edema.   She reported some chest discomfort, non radiating and associated with ambulation. Some nausea today, no vomiting, . She reported some loose bowel movements two days ago, which has resolved. No hematochezia, no hemoptysis, no syncope, no headache or dizziness, no sensory deficits.   On arrival to ED, she was tachypneic, hypoxic with sats at 88% on 2 Liters on ambulation. Pt reports on ambulation at home, her sats have dropped to 70%. She also reports non compliance to fluid and diet and reports missing her prednisone and lasix over the last 4 weeks. CXR showed some interstitial edema.  EKG did not show any ischemic changes. She was referred to medical service for admission and is currently being treated for Acute Decompensation of Chronic Diastolic CHF. IV Lasix increased from 60 mg BID to 80 mg BID yesterday as patient still remains dyspneic with Ambulation. She also started having some runs of VTACH. Will replete electrolytes and consult Cardiology in AM for further evaluation and recommendations of CHF and VTACH.   Cardiology evaluated and recommending continuing Diuresis at current dose and felt that patient's [facility] was not true [facility] and was artifact. Because patient remains dyspenic on exertion obtained a CTA of Chest to evaluate Lungs and r/o PE. CTA showed No pulmonary emboli but did show Increased cardiomegaly with new pulmonary vascular congestion. There was  minimal haziness in the lower lobes posteriorly which could represent slight pulmonary edema. Cardiology recommending continuing IV Diuresis today as she remains Volume Overloaded.   Assessment & Plan:   Active Problems:   OSA (obstructive sleep apnea)   Obesity hypoventilation syndrome (HCC)   Hypertension   Sarcoidosis   Iron deficiency anemia due to chronic blood loss   Hypothyroidism   Acute on chronic diastolic CHF (congestive heart failure) (HCC)   Congestive heart failure (HCC)   Anxiety   Chronic respiratory failure (HCC)   Diastolic dysfunction with acute on chronic heart failure (HCC)   GERD (gastroesophageal reflux disease)   CHF (congestive heart failure) (HCC)   Diastolic CHF ([place])  Acute on Chronic Respiratory Failure with Hypoxia 2/2 to Acute on Chronic Diastolic Heart Failure.  -Admitted to Telemetry.  -Checked BNP and was 11.1 -C/w IV Lasix 80 mg BID. Upon further discussion with the patient she did not take her 60 mg po Lasix BID as she should have and did not comply to a low salt diet  -C/w Metoprolol XL 25 mg po Daily and Losartan 100 mg po Daily  -Echocardiogram done and showed The estimated ejection fraction was in the range of 55% to 60%. Wall motion was normal; there were no regional wall motion abnormalities. The PA Pressure was 35 -C/w Daily Weights, and Strict Intake and Output.  -C/w Heart Healthy Diet and 1500 mL Fluid Restriction reduced to 1200 mL -Troponin were <0.03 x3 -Patient is -15.631 mL; Weight is down from 505 to 471 lbs  -Continue to Monitor Volume Status Carefully -Repeat Ambulatory Pulse Oximetry Screen this AM showed she desaturated again  on 2 Liters  -C/w Diuresis as above -Repeat CXR this AM showed Pulmonary vascular congestion. No edema or consolidation. Degree of cardiomegaly is stable. -CTA of Chest to evaluate Lungs and r/o PE showed No pulmonary emboli but did show Increased cardiomegaly with new pulmonary vascular congestion. There  was minimal haziness in the lower lobes posteriorly which could represent slight pulmonary edema.  -Cardiology consulted  for further evaluation and Recommendations   Non-sustained VTACH, Was not VTACH but Artifacat -Repleted Electrolytes -Repeat EKG showed NSR -Appreciated Cardiology Evaluation and feel like it was artifact   OSA on CPAP at Night  -Suspect Noncompliance -C/w CPAP while hospitalized continually declines   Chronic Rrespiratory Failure on 2 Liters of [place] oxygen 2/2 to OHS, OSA, Sarcoidosis -Follows up with Dr. Elsworth Soho as outpatient.  -Increased her Prednisone from 10 mg daily to 40 mg daily x3 days and and taper quickly over the next week.  -C/w Albuterol Neb 2.5 mg Neb q4hprn Wheezing or SOB -Repeat Ambulatory Pulse Oximetry Screening in AM -Repeat CXR done this AM as above   -Obtain CTA of Chest to evaluate Lungs and r/o PE given continued Desaturations on Ambulation   Hypothyroidism -Checked TSH and was 1.992 -C/w Levothyroxine 100 mcg po Daily   Hypertension -C/w Losartan 100 mg po Daily and Metoprolol XL 25 mg po Daily -Add Hydralazine 10 mg IV q6hprn for SBP >180 or DBP>100  Hyperkalemia, improved and -> Hypokalemia -Improved. K+ went from 5.3 -> 4.1 -> 3.4 -> 4.0 -Replete -Continue to Monitor and Repeat CMP in AM   GERD -Stable. -C/w Famotidine 40 mg po BID  Sarcoidosis -As Above -Given Prednisone 40 mg po Daily x3 days and then changed back to 10 mg po Daily   Hypomagnesemia -Mag Level was 1.4 and improved to 2.0 -Continue to Monitor and Replete as Necessary -Repeat Mag Level in AM  Normocytic Anemia -Patient's Hb/Hct stable at 11.6/39.4 -Has Hx of IDA -Anemia Panel showed Iron Level of 45, UIBC of 260, TIBC of 305, 15 Saturation Ratios, Ferritin of 26, Folate of 12.0, and Vitamin B12 of 387 -Continue to Monitor for S/Sx of Bleeding -Repeat CBC in AM   DVT prophylaxis: Enoxaparin 55 mg q12h  Code Status: FULL CODE Family  Communication: No family present at bedside Disposition Plan: Remain Inpatient for continued Diuresis today and pending clinical improvement; Anticipate D/C within 24-48 hours if improved and cleared by Cardiology.  Consultants:   Cardiology Dr. Percival Spanish   Discussed Case with Pulmonary Dr. Chesley Mires   Procedures:  ECHOCARDIOGRAM ------------------------------------------------------------------- Study Conclusions  - Left ventricle: The cavity size was normal. Wall thickness was   increased in a pattern of mild LVH. Systolic function was normal.   The estimated ejection fraction was in the range of 55% to 60%.   Wall motion was normal; there were no regional wall motion   abnormalities. - Aortic valve: Trileaflet; mildly calcified leaflets. - Right ventricle: The cavity size was mildly dilated. - Pulmonary arteries: Systolic pressure was mildly increased. PA   peak pressure: 35 mm Hg (S). - Pericardium, extracardiac: A trivial pericardial effusion was   identified.  Antimicrobials:  Anti-infectives (From admission, onward)   None     Subjective: Seen and examined and was wanting to go home but remained dyspenic on ambulation. Cardiology felt she was still extremely volume overloaded. No CP or lightheadedness.   Objective: Vitals:   01/16/18 0611 01/16/18 1048 01/16/18 1049 01/16/18 1053  BP: 138/68     Pulse:  88 (!) 154 (!) 113 (!) 113  Resp: 20     Temp: 98.6 F (37 C)     TempSrc: Oral     SpO2: 96% (!) 86% 98%   Weight: (!) 213.7 kg (471 lb 3.2 oz)     Height:        Intake/Output Summary (Last 24 hours) at 01/16/2018 1231 Last data filed at 01/16/2018 1042 Gross per 24 hour  Intake 480 ml  Output 4450 ml  Net -3970 ml   Filed Weights   01/14/18 0522 01/15/18 0611 01/16/18 0611  Weight: (!) 220 kg (485 lb 0.2 oz) (!) 214.9 kg (473 lb 12.8 oz) (!) 213.7 kg (471 lb 3.2 oz)   Examination: Physical Exam:  Constitutional: WN/WD morbidly obese AAF in NAD  sitting and is calm  Eyes: Sclerae anicteric; Lids normal ENMT: External ears and Nose appear normal. MMM Neck: Supple. Difficult to assess JVD due to body habitus Respiratory: Diminished but unlabored wearing supplemental O2 via ; No appreciable wheezing/rales/rhonci Cardiovascular: Distant Heart sounds but appeared RRR; No appreciable m/r/g. Has some edema but non pitting Abdomen: Soft, NT, Distended due to body habitus; Bowel sounds present  GU: Deferred Musculoskeletal: No contractures; No cyanosis Skin: Warm and Dry; No appreciable rashes on a limited skin eval Neurologic: CN 2-12 grossly intact. No appreciable focal deficits Psychiatric: Normal mood and affect. Intact judgement and insight  Data Reviewed: I have personally reviewed following labs and imaging studies  CBC: Recent Labs  Lab 01/12/18 0930 01/13/18 0447 01/14/18 0505 01/15/18 0444 01/16/18 0436  WBC 9.8 9.2 5.3 6.6 7.0  NEUTROABS 7.2 7.0 2.7 4.2 4.5  HGB 12.3 11.5* 11.7* 11.8* 11.6*  HCT 40.0 37.7 39.4 38.6 39.4  MCV 85.5 83.8 85.3 84.3 85.8  PLT 228 195 181 182 258   Basic Metabolic Panel: Recent Labs  Lab 01/12/18 0426 01/13/18 0447 01/14/18 0505 01/15/18 0444 01/16/18 0436  NA 141 144 143 143 142  K 4.0 4.1 3.4* 3.9 4.0  CL 100* 99* 98* 99* 96*  CO2 31 36* 33* 34* 34*  GLUCOSE 131* 120* 96 107* 97  BUN 19 23* 27* 25* 26*  CREATININE 0.84 0.85 0.76 0.74 0.82  CALCIUM 8.8* 9.1 8.9 8.9 9.5  MG 1.8 1.9 1.8 2.1 2.0  PHOS 3.7 4.1 4.2 3.6 3.7   GFR: Estimated Creatinine Clearance: 151.7 mL/min (by C-G formula based on SCr of 0.82 mg/dL). Liver Function Tests: Recent Labs  Lab 01/13/18 0447 01/14/18 0505 01/15/18 0444 01/16/18 0436  AST 10* 15 16 15   ALT 10* 13* 14 17  ALKPHOS 31* 34* 34* 34*  BILITOT 0.4 0.7 0.6 0.8  PROT 7.2 7.0 7.0 7.1  ALBUMIN 3.9 3.8 3.8 4.0   No results for input(s): LIPASE, AMYLASE in the last 168 hours. No results for input(s): AMMONIA in the last 168  hours. Coagulation Profile: No results for input(s): INR, PROTIME in the last 168 hours. Cardiac Enzymes: Recent Labs  Lab 01/10/18 1257 01/10/18 1922 01/11/18 0045  TROPONINI <0.03 <0.03 <0.03   BNP (last 3 results) No results for input(s): PROBNP in the last 8760 hours. HbA1C: No results for input(s): HGBA1C in the last 72 hours. CBG: Recent Labs  Lab 01/15/18 1233  GLUCAP 138*   Lipid Profile: No results for input(s): CHOL, HDL, LDLCALC, TRIG, CHOLHDL, LDLDIRECT in the last 72 hours. Thyroid Function Tests: No results for input(s): TSH, T4TOTAL, FREET4, T3FREE, THYROIDAB in the last 72 hours. Anemia Panel: Recent Labs  01/14/18 0505 01/14/18 0511  VITAMINB12  --  387  FOLATE  --  12.0  FERRITIN  --  26  TIBC  --  305  IRON  --  45  RETICCTPCT 1.3  --    Sepsis Labs: No results for input(s): PROCALCITON, LATICACIDVEN in the last 168 hours.  No results found for this or any previous visit (from the past 240 hour(s)).   Radiology Studies: Ct Angio Chest Pe W Or Wo Contrast  Result Date: 01/15/2018 CLINICAL DATA:  Increasing shortness of breath. Nonproductive cough. Subjective fevers. Hypoxia. Increasing leg edema. History of sarcoidosis. EXAM: CT ANGIOGRAPHY CHEST WITH CONTRAST TECHNIQUE: Multidetector CT imaging of the chest was performed using the standard protocol during bolus administration of intravenous contrast. Multiplanar CT image reconstructions and MIPs were obtained to evaluate the vascular anatomy. CONTRAST:  100 cc Isovue 370 COMPARISON:  Chest x-ray dated 01/15/2018. CT scans of the chest dated 02/14/2017 and 01/15/2015 FINDINGS: Cardiovascular: No pulmonary emboli. Increased cardiomegaly since the prior study. Slight pulmonary vascular prominence as compared to the prior study. No pericardial effusion. Aortic atherosclerosis. Mediastinum/Nodes: Slight chronic diffuse prominence of the thyroid gland without focal nodules. No hilar or mediastinal  adenopathy. Lungs/Pleura: Slight scarring at the left lung base. Slight haziness in the lung bases which may represent minimal pulmonary edema. No effusions. Upper Abdomen: No acute abnormality. Musculoskeletal: No chest wall abnormality. No acute or significant osseous findings. Review of the MIP images confirms the above findings. IMPRESSION: 1. No pulmonary emboli. 2. Increased cardiomegaly with new pulmonary vascular congestion. 3. Minimal haziness in the lower lobes posteriorly which could represent slight pulmonary edema. Electronically Signed   By: Lorriane Shire M.D.   On: 01/15/2018 15:07   Dg Chest Port 1 View  Result Date: 01/15/2018 CLINICAL DATA:  Shortness of Breath EXAM: PORTABLE CHEST 1 VIEW COMPARISON:  January 13, 2018 FINDINGS: There is no edema or consolidation. There is cardiomegaly with pulmonary venous hypertension. No adenopathy. No bone lesions. IMPRESSION: Pulmonary vascular congestion. No edema or consolidation. Degree of cardiomegaly is stable. Electronically Signed   By: Lowella Grip III M.D.   On: 01/15/2018 08:27   Scheduled Meds: . enoxaparin (LOVENOX) injection  55 mg Subcutaneous Q12H  . famotidine  40 mg Oral BID  . folic acid  1 mg Oral Daily  . furosemide  80 mg Intravenous BID  . guaiFENesin  1,200 mg Oral BID  . levothyroxine  100 mcg Oral Q0600  . loratadine  10 mg Oral Daily  . losartan  100 mg Oral Daily  . metoprolol succinate  25 mg Oral Daily  . potassium chloride  40 mEq Oral BID  . predniSONE  10 mg Oral Q breakfast  . sodium chloride flush  3 mL Intravenous Q12H  . cyanocobalamin  1,000 mcg Oral Daily   Continuous Infusions: . sodium chloride      LOS: 5 days   Kerney Elbe, DO [facility] Pager [phone]  If 7PM-7AM, please contact night-coverage [email or web address] Password [facility] 01/16/2018, 12:31 PM

## 2018-01-16 NOTE — Progress Notes (Signed)
Patient ambulated with [place] 2L approximately 150 ft. Oxygen dropped to 86% on 2L and pt HR went from 100 to 154 with ambulation. After ambulating HR 113. O2 92. NP from cardiology notified. Will continue to monitor patient.

## 2018-01-16 NOTE — Progress Notes (Signed)
Progress Note  Patient Name: [name] Date of Encounter: 01/16/2018  Primary Cardiologist: Candee Furbish, MD   Subjective   Breathing has been slowly improving, not quite back to baseline per patient.  Lower extremity edema is much improved, near her baseline.  The patient took a shower this morning without difficulty per the nurse.  Inpatient Medications    Scheduled Meds: . enoxaparin (LOVENOX) injection  55 mg Subcutaneous Q12H  . famotidine  40 mg Oral BID  . folic acid  1 mg Oral Daily  . furosemide  80 mg Intravenous BID  . guaiFENesin  1,200 mg Oral BID  . levothyroxine  100 mcg Oral Q0600  . loratadine  10 mg Oral Daily  . losartan  100 mg Oral Daily  . metoprolol succinate  25 mg Oral Daily  . potassium chloride  40 mEq Oral BID  . predniSONE  10 mg Oral Q breakfast  . sodium chloride flush  3 mL Intravenous Q12H  . cyanocobalamin  1,000 mcg Oral Daily   Continuous Infusions: . sodium chloride     PRN Meds: sodium chloride, acetaminophen, albuterol, hydrALAZINE, HYDROcodone-acetaminophen, ondansetron (ZOFRAN) IV, sodium chloride flush   Vital Signs    Vitals:   01/15/18 0611 01/15/18 1540 01/15/18 2233 01/16/18 0611  BP:  (!) 153/75 131/68 138/68  Pulse:  82 78 88  Resp:  18 18 20   Temp:  98.1 F (36.7 C) 98 F (36.7 C) 98.6 F (37 C)  TempSrc:  Oral Oral Oral  SpO2:  100% 99% 96%  Weight: (!) 473 lb 12.8 oz (214.9 kg)   (!) 471 lb 3.2 oz (213.7 kg)  Height:        Intake/Output Summary (Last 24 hours) at 01/16/2018 0827 Last data filed at 01/16/2018 1610 Gross per 24 hour  Intake 720 ml  Output 3850 ml  Net -3130 ml   Filed Weights   01/14/18 0522 01/15/18 0611 01/16/18 0611  Weight: (!) 485 lb 0.2 oz (220 kg) (!) 473 lb 12.8 oz (214.9 kg) (!) 471 lb 3.2 oz (213.7 kg)    Telemetry    Sinus rhythm in the 70s with no ectopy- Personally Reviewed  ECG    No new tracings- Personally Reviewed  Physical Exam   GEN:  Obese female,  no acute distress.   Neck: Difficult to assess JVD due to body habitus Cardiac: RRR, no murmurs, rubs, or gallops.  Respiratory: Clear to auscultation bilaterally. GI: Soft, nontender, non-distended  MS: Trace-1+ lower extremity edema; No deformity. Neuro:  Nonfocal  Psych: Normal affect   Labs    Chemistry Recent Labs  Lab 01/14/18 0505 01/15/18 0444 01/16/18 0436  NA 143 143 142  K 3.4* 3.9 4.0  CL 98* 99* 96*  CO2 33* 34* 34*  GLUCOSE 96 107* 97  BUN 27* 25* 26*  CREATININE 0.76 0.74 0.82  CALCIUM 8.9 8.9 9.5  PROT 7.0 7.0 7.1  ALBUMIN 3.8 3.8 4.0  AST 15 16 15   ALT 13* 14 17  ALKPHOS 34* 34* 34*  BILITOT 0.7 0.6 0.8  GFRNONAA >60 >60 >60  GFRAA >60 >60 >60  ANIONGAP 12 10 12      Hematology Recent Labs  Lab 01/14/18 0505 01/15/18 0444 01/16/18 0436  WBC 5.3 6.6 7.0  RBC 4.62  4.62 4.58 4.59  HGB 11.7* 11.8* 11.6*  HCT 39.4 38.6 39.4  MCV 85.3 84.3 85.8  MCH 25.3* 25.8* 25.3*  MCHC 29.7* 30.6 29.4*  RDW 18.7* 18.6*  19.0*  PLT 181 182 184    Cardiac Enzymes Recent Labs  Lab 01/10/18 1257 01/10/18 1922 01/11/18 0045  TROPONINI <0.03 <0.03 <0.03    Recent Labs  Lab 01/10/18 0637  TROPIPOC 0.00     BNP Recent Labs  Lab 01/14/18 0511  BNP 11.1     DDimer No results for input(s): DDIMER in the last 168 hours.   Radiology    Ct Angio Chest Pe W Or Wo Contrast  Result Date: 01/15/2018 CLINICAL DATA:  Increasing shortness of breath. Nonproductive cough. Subjective fevers. Hypoxia. Increasing leg edema. History of sarcoidosis. EXAM: CT ANGIOGRAPHY CHEST WITH CONTRAST TECHNIQUE: Multidetector CT imaging of the chest was performed using the standard protocol during bolus administration of intravenous contrast. Multiplanar CT image reconstructions and MIPs were obtained to evaluate the vascular anatomy. CONTRAST:  100 cc Isovue 370 COMPARISON:  Chest x-ray dated 01/15/2018. CT scans of the chest dated 02/14/2017 and 01/15/2015 FINDINGS:  Cardiovascular: No pulmonary emboli. Increased cardiomegaly since the prior study. Slight pulmonary vascular prominence as compared to the prior study. No pericardial effusion. Aortic atherosclerosis. Mediastinum/Nodes: Slight chronic diffuse prominence of the thyroid gland without focal nodules. No hilar or mediastinal adenopathy. Lungs/Pleura: Slight scarring at the left lung base. Slight haziness in the lung bases which may represent minimal pulmonary edema. No effusions. Upper Abdomen: No acute abnormality. Musculoskeletal: No chest wall abnormality. No acute or significant osseous findings. Review of the MIP images confirms the above findings. IMPRESSION: 1. No pulmonary emboli. 2. Increased cardiomegaly with new pulmonary vascular congestion. 3. Minimal haziness in the lower lobes posteriorly which could represent slight pulmonary edema. Electronically Signed   By: Lorriane Shire M.D.   On: 01/15/2018 15:07   Dg Chest Port 1 View  Result Date: 01/15/2018 CLINICAL DATA:  Shortness of Breath EXAM: PORTABLE CHEST 1 VIEW COMPARISON:  January 13, 2018 FINDINGS: There is no edema or consolidation. There is cardiomegaly with pulmonary venous hypertension. No adenopathy. No bone lesions. IMPRESSION: Pulmonary vascular congestion. No edema or consolidation. Degree of cardiomegaly is stable. Electronically Signed   By: Lowella Grip III M.D.   On: 01/15/2018 08:27    Cardiac Studies   Echocardiogram 01/11/18 Study Conclusions - Left ventricle: The cavity size was normal. Wall thickness was increased in a pattern of mild LVH. Systolic function was normal. The estimated ejection fraction was in the range of 55% to 60%. Wall motion was normal; there were no regional wall motion abnormalities. - Aortic valve: Trileaflet; mildly calcified leaflets. - Right ventricle: The cavity size was mildly dilated. - Pulmonary arteries: Systolic pressure was mildly increased. PA peak pressure: 35 mm Hg  (S). - Pericardium, extracardiac: A trivial pericardial effusion was identified.   Patient Profile     52 y.o. female with a hx of anemia, diastolic heart failure, chronic respiratory failure on 2 L home oxygen, obesity hypoventilation syndrome, OSA on CPAP, morbid obesity, GERD, hypertension, hypothyroidism, sarcoidosis was admitted to [facility] who is being seen for the evaluation of CHF and Vtach. No Vtach- was artifact.   Assessment & Plan    Acute on chronic diastolic heart failure -BNP 11.1, however this is possibly under represented in setting of morbid obesity.  Troponins were negative x3 -Echo showed  mild LVH with normal LV systolic function, EF 34-74%, no regional wall motion abnormalities, mildly dilated RV, peak PA pressure 35 mmHg and trivial pericardial effusion -Diuresing well with lasix 80 mg IV BID. Wts are fluctuating and not representative  of the 3.8L UOP over last 24h and net negative 15L fluid balance since admission.  -SCr has not bumped yet.  Breathing has improved slowly but not quite back to baseline per the patient report although she did tolerate getting in the shower this morning. -CTA chest done yesterday negative for PE, increased cardiomegaly with new pulmonary vascular congestion. Will review with Dr. Oval Linsey.  -Pt ordered 60 mg BID at home but admits that she was only taking 20 mg bid and sometimes only once per day. Likely needs the increased lasix dosing.  -Continued on her home metoprolol XL 25 mg daily and losartan 100 mg daily.  -1200 mL fluid restriction. I had a long discussion on dietary sodium restriction with the patent.  -Strict intake and output and daily weights. -Patient is likely ready for discharge home pending review by Dr Oval Linsey.  Would continue Lasix 80 mg twice daily and encourage compliance.  Acute on chronic respiratory failure -Followed by pulmonology medicine, Dr. Elsworth Soho as well as Sallee Provencal, her primary provider.   History of sarcoidosis on home oxygen 2 L per nasal cannula -Patient with obesity related hypoventilation syndrome and obstructive sleep apnea on CPAP. -Per primary team patient is on steroids, nebulizer, increased oxygen  OSA -Pt states compliance with CPAP with >4 hrs of wear every night.    Hyperkalemia -Potassium 5.3 on admission, Magnesium was 1.4.  Electrolytes have been managed and currently potassium is 4.0 and magnesium 2.0.   Obesity -Body mass index is 78.84 kg/m. Likely contributing to her current issues.   GERD -Stable on famotidine 40 mg p.o. twice daily  Pulmonary Sarcoidosis -As above  Normocytic anemia -Evaluation per primary team    For questions or updates, please contact [place] Please consult [email or web address] for contact info under Cardiology/STEMI.      Signed, Daune Perch, NP  01/16/2018, 8:27 AM

## 2018-01-17 ENCOUNTER — Other Ambulatory Visit: Payer: Self-pay | Admitting: Physician Assistant

## 2018-01-17 DIAGNOSIS — Z6841 Body Mass Index (BMI) 40.0 and over, adult: Secondary | ICD-10-CM

## 2018-01-17 DIAGNOSIS — I5032 Chronic diastolic (congestive) heart failure: Secondary | ICD-10-CM

## 2018-01-17 DIAGNOSIS — J9621 Acute and chronic respiratory failure with hypoxia: Secondary | ICD-10-CM

## 2018-01-17 LAB — CREATININE, SERUM
CREATININE: 1.28 mg/dL — AB (ref 0.44–1.00)
GFR calc Af Amer: 55 mL/min — ABNORMAL LOW (ref >60)
GFR, EST NON AFRICAN AMERICAN: 47 mL/min — AB (ref >60)

## 2018-01-17 MED ORDER — POTASSIUM CHLORIDE CRYS ER 20 MEQ PO TBCR: 20.0000 meq | EXTENDED_RELEASE_TABLET | Freq: Two times a day (BID) | ORAL | Status: DC

## 2018-01-17 MED ORDER — PREDNISONE 10 MG PO TABS: 10.0000 mg | ORAL_TABLET | Freq: Every day | ORAL | 0 refills | Status: DC

## 2018-01-17 MED ORDER — FUROSEMIDE 40 MG PO TABS
60.0000 mg | ORAL_TABLET | Freq: Two times a day (BID) | ORAL | Status: DC
  Administered 2018-01-17: 60 mg via ORAL
  Filled 2018-01-17: qty 1

## 2018-01-17 MED ORDER — POTASSIUM CHLORIDE CRYS ER 20 MEQ PO TBCR: 20.0000 meq | EXTENDED_RELEASE_TABLET | ORAL | 0 refills | Status: DC

## 2018-01-17 NOTE — Discharge Summary (Signed)
Discharge Summary  [name] STM:[id number] DOB: [birth year]  PCP: [name], NP  Admit date: 01/10/2018 Discharge date: 01/17/2018  Time spent: >78mins, more than 50% time spent on coordination of care.  Recommendations for Outpatient Follow-up:  1. F/u with PMD within a week  for hospital discharge follow up, repeat cbc/bmp at follow up 2. F/u with cardiology, repeat labs on 3/27 3. F/u with pulmonology Dr Elsworth Soho 4. Continue home o2  Discharge Diagnoses:  Active Hospital Problems   Diagnosis Date Noted  . Diastolic CHF ([place]) 89/21/1942  . CHF (congestive heart failure) ([place]) 01/10/2018  . GERD (gastroesophageal reflux disease) 02/12/2017  . Diastolic dysfunction with acute on chronic heart failure ([place]) 06/17/2016  . Chronic respiratory failure ([place]) 09/21/2015  . Anxiety 04/14/2015  . SOB (shortness of breath)   . Acute on chronic diastolic CHF (congestive heart failure) ([place]) 01/18/2015  . Congestive heart failure ([place])   . Hypothyroidism 01/15/2015  . Sarcoidosis 09/09/2012  . Iron deficiency anemia due to chronic blood loss 09/09/2012  . Hypertension 02/25/2011  . Obesity hypoventilation syndrome ([place]) 02/14/2011  . OSA (obstructive sleep apnea) 02/10/2011    Resolved Hospital Problems  No resolved problems to display.    Discharge Condition: stable  Diet recommendation: heart healthy  Filed Weights   01/15/18 0611 01/16/18 0611 01/17/18 0424  Weight: (!) 214.9 kg (473 lb 12.8 oz) (!) 213.7 kg (471 lb 3.2 oz) (!) 213.6 kg (470 lb 14.4 oz)    History of present illness:  PCP: [name], NP  Patient coming from: [place].   I have personally briefly reviewed patient's old medical records in [place]  Chief Complaint: sob for 3 weeks.   HPI: [name] is a 52 y.o. female with medical history significant of anemia, diastolic heart failure , chronic respiratory failure on 2 lit [place], OHS, OSA, on CPAP, morbid obesity, GERD,  hypertension, hypothyroidism comes in for worsening sob for the last 3 weeks, associated with dry cough, subjective fevers, chills and hypoxia on ambulation and worsening leg edema. She reports some chest discomfort, non radiating and associated with ambulation. Some nausea today, no vomiting, . She reports some loose bowel movements two days ago, which has resolved. No hematochezia, no hemoptysis, no syncope, no headache or dizziness, no sensory deficits. On arrival to ED, she was tachypneic, hypoxic with sats at 88% on 2 lit on ambulation. Pt reports on ambulation at home, her sats have dropped to 70%. She also reports non compliance to fluid and diet and reports missing her prednisone and lasix over the last 4 weeks. Her lab work revealed mild hyperkalemia , normal wbc count and baseline creatinine. poc troponin is negative. CXR shows some interstitial edema.  EKG does not show any ischemic changes. She was referred to medical service for admission for possible worsening heart failure.     Hospital Course:  Active Problems:   OSA (obstructive sleep apnea)   Obesity hypoventilation syndrome (HCC)   Hypertension   Sarcoidosis   Iron deficiency anemia due to chronic blood loss   Hypothyroidism   Acute on chronic diastolic CHF (congestive heart failure) (HCC)   Congestive heart failure (HCC)   SOB (shortness of breath)   Anxiety   Chronic respiratory failure (HCC)   Diastolic dysfunction with acute on chronic heart failure (HCC)   GERD (gastroesophageal reflux disease)   CHF (congestive heart failure) (HCC)   Diastolic CHF ([place])  Acute on Chronic Respiratory Failure with Hypoxia 2/2 to  Acute on Chronic Diastolic Heart Failure.  -She presented with increased shortness of breath for 3 weeks, she has no chest pain troponin negative, she reports not taking her Lasix as prescribed, not to follow low-sodium diet at home.  Also drink plenty of water at home -Echocardiogram done and showed The  estimated ejection fraction was in the range of 55% to 60%. Wall motion was normal; there were no regional wall motion abnormalities. The PA Pressure was 35 -Cardiology consulted, continue metoprolol XL, losartan, she received IV Lasix with good diuresis, total negative  17 liters since admission, weight down from 505 on admission to 470 at discharge. -She is cleared to discharge home by cardiology.  She is advised to take Lasix 60 mg twice a day.  She is to have repeat BMP on the 27th this month and follow-up with his cardiologist.  Electrolyte abnormalitis: -Presented with hyperkalemia, potassium 5.3 on presentation.  Then developed hypokalemia requirng potassium supplement.  He is to have repeat BMP at hospital discharge follow-up. -Hypomagnesemia: replaced  Pulmonary sarcoidosis/immunosuppressed status Per last pulmonary notes sarcoidosis  is stable, she is continued on prednisone 10 mg daily and methotrexate injection weekly. Is to follow-up with Dr. Elsworth Soho  Normocytic anemia -Chronic, stable -He is followed by hematologist Dr. Burr Medico   Hypothyroidism; stable on home meds  Morbid obesity; OSA on CPAP at night Body mass index is 78.36 kg/m.    Procedures:  none  Consultations:  Cardiology   Discharge Exam: BP 127/61 (BP Location: Right Arm)   Pulse 64   Temp 97.7 F (36.5 C) (Oral)   Resp 20   Ht 5\' 5"  (1.651 m)   Wt (!) 213.6 kg (470 lb 14.4 oz)   LMP 12/14/2017   SpO2 100%   BMI 78.36 kg/m   General: NAD, obese,  Cardiovascular: RRR Respiratory: CTABL Extremities: no edema   Discharge Instructions You were cared for by a hospitalist during your hospital stay. If you have any questions about your discharge medications or the care you received while you were in the hospital after you are discharged, you can call the unit and asked to speak with the hospitalist on call if the hospitalist that took care of you is not available. Once you are discharged, your primary  care physician will handle any further medical issues. Please note that NO REFILLS for any discharge medications will be authorized once you are discharged, as it is imperative that you return to your primary care physician (or establish a relationship with a primary care physician if you do not have one) for your aftercare needs so that they can reassess your need for medications and monitor your lab values.  Discharge Instructions    Diet - low sodium heart healthy   Complete by:  As directed    Increase activity slowly   Complete by:  As directed      Allergies as of 01/17/2018   No Known Allergies     Medication List    TAKE these medications   albuterol 108 (90 Base) MCG/ACT inhaler Commonly known as:  PROAIR HFA Inhale 1-2 puffs into the lungs every 6 hours as needed for wheezing.   cetirizine 10 MG tablet Commonly known as:  ZYRTEC Take 10 mg by mouth at bedtime as needed for allergies.   cholecalciferol 400 units Tabs tablet Commonly known as:  VITAMIN D Take 400 Units by mouth daily.   Coral Calcium 1000 (390 Ca) MG Tabs Take 1,000 mg by mouth daily.  cyanocobalamin 1000 MCG tablet Take 1 tablet (1,000 mcg total) by mouth daily.   famotidine 40 MG tablet Commonly known as:  PEPCID Take 1 tablet (40 mg total) by mouth 2 (two) times daily.   folic acid 1 MG tablet Commonly known as:  FOLVITE Take 1 mg by mouth daily.   furosemide 20 MG tablet Commonly known as:  LASIX Take 3 tablets (60 mg total) by mouth 2 (two) times daily.   HYDROcodone-acetaminophen 10-325 MG tablet Commonly known as:  NORCO Take 1 tablet by mouth every 6 (six) hours as needed (pain).   KENALOG 40 MG/ML injection Generic drug:  triamcinolone acetonide Inject 40 mg into the muscle every 3 (three) months. For knee pain   levothyroxine 100 MCG tablet Commonly known as:  SYNTHROID, LEVOTHROID TAKE 1 TABLET (100 MCG TOTAL) BY MOUTH DAILY AT 6 (SIX) AM.   losartan 100 MG tablet Commonly  known as:  COZAAR Take 1 tablet (100 mg total) daily by mouth.   methotrexate 50 MG/2ML injection Inject 15 mg into the skin every Friday. Injects 0.68ml.   metoprolol succinate 25 MG 24 hr tablet Commonly known as:  TOPROL-XL TAKE 1 TABLET BY MOUTH EVERY DAY   NON FORMULARY 2 liter of oxygen   potassium chloride SA 20 MEQ tablet Commonly known as:  K-DUR,KLOR-CON Take 1 tablet (20 mEq total) by mouth every Monday, Wednesday, and Friday for 3 doses.   predniSONE 10 MG tablet Commonly known as:  DELTASONE Take 1 tablet (10 mg total) by mouth daily.   triamcinolone ointment 0.1 % Commonly known as:  KENALOG Apply 1 application topically See admin instructions. Applies twice a day as needed for break out of Sarcoidosis      No Known Allergies Follow-up Information    [facility] [facility] Follow up on 01/24/2018.   Specialty:  Cardiology Why:  1 week BMET lab at [place] office between 8:30AM to 4:30PM. No fasting needed Contact information: [address] [place]       Isaiah Serge, NP Follow up on 02/09/2018.   Specialties:  Cardiology, Radiology Why:  @8AM . with Dr. Kingsley Plan NP. Cardiology followup Contact information: [place] [address] [phone]        [name], NP Follow up in 1 week(s).   Specialty:  Family Medicine Why:  hospital discharge follow up Contact information: [place] [address] [phone]        Jerline Pain, MD Follow up.   Specialty:  Cardiology Contact information: [address] [place] [address] [phone]            The results of significant diagnostics from this hospitalization (including imaging, microbiology, ancillary and laboratory) are listed below for reference.    Significant Diagnostic Studies: Dg Chest 2 View  Result Date: 01/10/2018 CLINICAL DATA:  Chronic  worsening shortness of breath. Personal history of sarcoidosis. EXAM: CHEST - 2 VIEW COMPARISON:  CT of the chest performed 02/14/2017, and chest radiograph performed 02/11/2017 FINDINGS: The lungs are well-aerated. Vascular congestion is noted. Relatively stable increased interstitial markings are again noted. There is no evidence of pleural effusion or pneumothorax. The heart is borderline normal in size. No acute osseous abnormalities are seen. IMPRESSION: Vascular congestion; relatively stable increased interstitial markings noted. Electronically Signed   By: Garald Balding M.D.   On: 01/10/2018 06:19   Ct Angio Chest  Pe W Or Wo Contrast  Result Date: 01/15/2018 CLINICAL DATA:  Increasing shortness of breath. Nonproductive cough. Subjective fevers. Hypoxia. Increasing leg edema. History of sarcoidosis. EXAM: CT ANGIOGRAPHY CHEST WITH CONTRAST TECHNIQUE: Multidetector CT imaging of the chest was performed using the standard protocol during bolus administration of intravenous contrast. Multiplanar CT image reconstructions and MIPs were obtained to evaluate the vascular anatomy. CONTRAST:  100 cc Isovue 370 COMPARISON:  Chest x-ray dated 01/15/2018. CT scans of the chest dated 02/14/2017 and 01/15/2015 FINDINGS: Cardiovascular: No pulmonary emboli. Increased cardiomegaly since the prior study. Slight pulmonary vascular prominence as compared to the prior study. No pericardial effusion. Aortic atherosclerosis. Mediastinum/Nodes: Slight chronic diffuse prominence of the thyroid gland without focal nodules. No hilar or mediastinal adenopathy. Lungs/Pleura: Slight scarring at the left lung base. Slight haziness in the lung bases which may represent minimal pulmonary edema. No effusions. Upper Abdomen: No acute abnormality. Musculoskeletal: No chest wall abnormality. No acute or significant osseous findings. Review of the MIP images confirms the above findings. IMPRESSION: 1. No pulmonary emboli. 2. Increased  cardiomegaly with new pulmonary vascular congestion. 3. Minimal haziness in the lower lobes posteriorly which could represent slight pulmonary edema. Electronically Signed   By: Lorriane Shire M.D.   On: 01/15/2018 15:07   Dg Chest Port 1 View  Result Date: 01/15/2018 CLINICAL DATA:  Shortness of Breath EXAM: PORTABLE CHEST 1 VIEW COMPARISON:  January 13, 2018 FINDINGS: There is no edema or consolidation. There is cardiomegaly with pulmonary venous hypertension. No adenopathy. No bone lesions. IMPRESSION: Pulmonary vascular congestion. No edema or consolidation. Degree of cardiomegaly is stable. Electronically Signed   By: Lowella Grip III M.D.   On: 01/15/2018 08:27   Dg Chest Port 1 View  Result Date: 01/13/2018 CLINICAL DATA:  sob EXAM: PORTABLE CHEST 1 VIEW COMPARISON:  None. FINDINGS: Stable enlarged cardiac silhouette. Interval improvement in central venous congestion. Small RIGHT effusion. No pneumothorax. IMPRESSION: Cardiomegaly.  Improved central venous congestion. Electronically Signed   By: Suzy Bouchard M.D.   On: 01/13/2018 07:14    Microbiology: No results found for this or any previous visit (from the past 240 hour(s)).   Labs: Basic Metabolic Panel: Recent Labs  Lab 01/12/18 0426 01/13/18 0447 01/14/18 0505 01/15/18 0444 01/16/18 0436 01/17/18 0432  NA 141 144 143 143 142  --   K 4.0 4.1 3.4* 3.9 4.0  --   CL 100* 99* 98* 99* 96*  --   CO2 31 36* 33* 34* 34*  --   GLUCOSE 131* 120* 96 107* 97  --   BUN 19 23* 27* 25* 26*  --   CREATININE 0.84 0.85 0.76 0.74 0.82 1.28*  CALCIUM 8.8* 9.1 8.9 8.9 9.5  --   MG 1.8 1.9 1.8 2.1 2.0  --   PHOS 3.7 4.1 4.2 3.6 3.7  --    Liver Function Tests: Recent Labs  Lab 01/13/18 0447 01/14/18 0505 01/15/18 0444 01/16/18 0436  AST 10* 15 16 15   ALT 10* 13* 14 17  ALKPHOS 31* 34* 34* 34*  BILITOT 0.4 0.7 0.6 0.8  PROT 7.2 7.0 7.0 7.1  ALBUMIN 3.9 3.8 3.8 4.0   No results for input(s): LIPASE, AMYLASE in the last 168  hours. No results for input(s): AMMONIA in the last 168 hours. CBC: Recent Labs  Lab 01/12/18 0930 01/13/18 0447 01/14/18 0505 01/15/18 0444 01/16/18 0436  WBC 9.8 9.2 5.3 6.6 7.0  NEUTROABS 7.2 7.0 2.7 4.2 4.5  HGB 12.3 11.5*  11.7* 11.8* 11.6*  HCT 40.0 37.7 39.4 38.6 39.4  MCV 85.5 83.8 85.3 84.3 85.8  PLT 228 195 181 182 184   Cardiac Enzymes: Recent Labs  Lab 01/10/18 1257 01/10/18 1922 01/11/18 0045  TROPONINI <0.03 <0.03 <0.03   BNP: BNP (last 3 results) Recent Labs    02/11/17 2249 01/14/18 0511  BNP 16.9 11.1    ProBNP (last 3 results) No results for input(s): PROBNP in the last 8760 hours.  CBG: Recent Labs  Lab 01/15/18 1233  GLUCAP 138*       Signed:  Florencia Reasons MD, PhD  [facility] 01/17/2018, 12:22 PM

## 2018-01-17 NOTE — Progress Notes (Addendum)
Progress Note  Patient Name: [name] Date of Encounter: 01/17/2018  Primary Cardiologist: Candee Furbish, MD   Subjective   Feeling good. 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 No CP.   Inpatient Medications    Scheduled Meds: . enoxaparin (LOVENOX) injection  55 mg Subcutaneous Q12H  . famotidine  40 mg Oral BID  . folic acid  1 mg Oral Daily  . furosemide  80 mg Intravenous BID  . guaiFENesin  1,200 mg Oral BID  . levothyroxine  100 mcg Oral Q0600  . loratadine  10 mg Oral Daily  . losartan  100 mg Oral Daily  . metoprolol succinate  25 mg Oral Daily  . potassium chloride  40 mEq Oral BID  . predniSONE  10 mg Oral Q breakfast  . sodium chloride flush  3 mL Intravenous Q12H  . cyanocobalamin  1,000 mcg Oral Daily   Continuous Infusions: . sodium chloride     PRN Meds: sodium chloride, acetaminophen, albuterol, hydrALAZINE, HYDROcodone-acetaminophen, ondansetron (ZOFRAN) IV, sodium chloride flush   Vital Signs    Vitals:   01/16/18 1529 01/16/18 1737 01/16/18 2139 01/17/18 0424  BP: 118/63 (!) 120/59 124/64 127/61  Pulse: 76 72 74 64  Resp: 20 20 20 20   Temp: 98.7 F (37.1 C) 99 F (37.2 C) 98 F (36.7 C) 97.7 F (36.5 C)  TempSrc:  Oral Oral Oral  SpO2: 92% 100% 97% 100%  Weight:    (!) 470 lb 14.4 oz (213.6 kg)  Height:        Intake/Output Summary (Last 24 hours) at 01/17/2018 0904 Last data filed at 01/17/2018 0429 Gross per 24 hour  Intake 560 ml  Output 2550 ml  Net -1990 ml   Filed Weights   01/15/18 0611 01/16/18 0611 01/17/18 0424  Weight: (!) 473 lb 12.8 oz (214.9 kg) (!) 471 lb 3.2 oz (213.7 kg) (!) 470 lb 14.4 oz (213.6 kg)    Telemetry    NSR without ventricular ectopy - Personally Reviewed  ECG    Normal sinus rhythm, no ischemia - Personally Reviewed  Physical Exam   GEN: No acute distress.   Neck: No JVD Cardiac: RRR, no murmurs, rubs,  or gallops.  Respiratory: Clear to auscultation bilaterally. GI: Soft, nontender, non-distended  MS: No edema; No deformity. Neuro:  Nonfocal  Psych: Normal affect   Labs    Chemistry Recent Labs  Lab 01/14/18 0505 01/15/18 0444 01/16/18 0436 01/17/18 0432  NA 143 143 142  --   K 3.4* 3.9 4.0  --   CL 98* 99* 96*  --   CO2 33* 34* 34*  --   GLUCOSE 96 107* 97  --   BUN 27* 25* 26*  --   CREATININE 0.76 0.74 0.82 1.28*  CALCIUM 8.9 8.9 9.5  --   PROT 7.0 7.0 7.1  --   ALBUMIN 3.8 3.8 4.0  --   AST 15 16 15   --   ALT 13* 14 17  --   ALKPHOS 34* 34* 34*  --   BILITOT 0.7 0.6 0.8  --   GFRNONAA >60 >60 >60 47*  GFRAA >60 >60 >60 55*  ANIONGAP 12 10 12   --      Hematology Recent Labs  Lab 01/14/18 0505 01/15/18 0444 01/16/18 0436  WBC 5.3 6.6 7.0  RBC 4.62  4.62 4.58 4.59  HGB  11.7* 11.8* 11.6*  HCT 39.4 38.6 39.4  MCV 85.3 84.3 85.8  MCH 25.3* 25.8* 25.3*  MCHC 29.7* 30.6 29.4*  RDW 18.7* 18.6* 19.0*  PLT 181 182 184    Cardiac Enzymes Recent Labs  Lab 01/10/18 1257 01/10/18 1922 01/11/18 0045  TROPONINI <0.03 <0.03 <0.03   No results for input(s): TROPIPOC in the last 168 hours.   BNP Recent Labs  Lab 01/14/18 0511  BNP 11.1     DDimer No results for input(s): DDIMER in the last 168 hours.   Radiology    Ct Angio Chest Pe W Or Wo Contrast  Result Date: 01/15/2018 CLINICAL DATA:  Increasing shortness of breath. Nonproductive cough. Subjective fevers. Hypoxia. Increasing leg edema. History of sarcoidosis. EXAM: CT ANGIOGRAPHY CHEST WITH CONTRAST TECHNIQUE: Multidetector CT imaging of the chest was performed using the standard protocol during bolus administration of intravenous contrast. Multiplanar CT image reconstructions and MIPs were obtained to evaluate the vascular anatomy. CONTRAST:  100 cc Isovue 370 COMPARISON:  Chest x-ray dated 01/15/2018. CT scans of the chest dated 02/14/2017 and 01/15/2015 FINDINGS: Cardiovascular: No pulmonary  emboli. Increased cardiomegaly since the prior study. Slight pulmonary vascular prominence as compared to the prior study. No pericardial effusion. Aortic atherosclerosis. Mediastinum/Nodes: Slight chronic diffuse prominence of the thyroid gland without focal nodules. No hilar or mediastinal adenopathy. Lungs/Pleura: Slight scarring at the left lung base. Slight haziness in the lung bases which may represent minimal pulmonary edema. No effusions. Upper Abdomen: No acute abnormality. Musculoskeletal: No chest wall abnormality. No acute or significant osseous findings. Review of the MIP images confirms the above findings. IMPRESSION: 1. No pulmonary emboli. 2. Increased cardiomegaly with new pulmonary vascular congestion. 3. Minimal haziness in the lower lobes posteriorly which could represent slight pulmonary edema. Electronically Signed   By: Lorriane Shire M.D.   On: 01/15/2018 15:07    Cardiac Studies   Echo 01/11/2018 LV EF: 55% -   60% Study Conclusions  - Left ventricle: The cavity size was normal. Wall thickness was   increased in a pattern of mild LVH. Systolic function was normal.   The estimated ejection fraction was in the range of 55% to 60%.   Wall motion was normal; there were no regional wall motion   abnormalities. - Aortic valve: Trileaflet; mildly calcified leaflets. - Right ventricle: The cavity size was mildly dilated. - Pulmonary arteries: Systolic pressure was mildly increased. PA   peak pressure: 35 mm Hg (S). - Pericardium, extracardiac: A trivial pericardial effusion was   identified.  Patient Profile     52 y.o. female with a hx of anemia, diastolic heart failure, chronic respiratory failure on 2 L home oxygen, obesity hypoventilation syndrome, OSA on CPAP, morbid obesity, GERD, hypertension, hypothyroidism, sarcoidosis [facility] the evaluation of CHF and Vtach. No Vtach- was artifact.    Assessment & Plan    1.  Acute on chronic diastolic HF  - Echo 4/81/8563 EF 55-60%, PA peak pressure 48mmHg  - Net -17L, weight decreased from 505 on admission to 470 lbs today which serves as her dry weight  - she was noncompliant with the dosage of diuretic at home.   - she is euvolemic on exam, Cr trended up slightly. Transition to oral 60mg  BID with 20 meq BID KCl. Ambulate, likely discharge around noon once seen by cardiologist MD. Will need 1 week BMET and followup with cardiology in 2 weeks. (I will arrange)  2.  Acute on chronic respiratory failure: breathing better. Ambulate today, check O2 with ambulation  3. OSA on CPAP  4. Pulmonary sarcoidosis  5. Morbid obesity: this is the main issue, she will need to lose weight    For questions or updates, please contact [place] Please consult [email or web address] for contact info under Cardiology/STEMI.      Hilbert Corrigan, PA  01/17/2018, 9:04 AM    History and all data above reviewed.  Patient examined. She thinks that she is breathing back to baseline.  No pain.  No palpitations.  She has ambulated in the hallways and says that her sats go down but not as low as they were  I agree with the findings as above.  The patient exam reveals COR:RRR  ,  Lungs: Clear  ,  Abd: Positive bowel sounds, no rebound no guarding, Ext Mild edema  .  All available labs, radiology testing, previous records reviewed. Agree with documented assessment and plan. Acute on chronic CHF.  Home with increased PO Lasix as written.  She will need close follow up of her labs and volume status.   Jeneen Rinks Alia Parsley  11:21 AM  01/17/2018

## 2018-01-24 ENCOUNTER — Other Ambulatory Visit: Payer: Medicare HMO

## 2018-01-31 ENCOUNTER — Other Ambulatory Visit: Payer: Medicare HMO

## 2018-01-31 ENCOUNTER — Ambulatory Visit (INDEPENDENT_AMBULATORY_CARE_PROVIDER_SITE_OTHER): Payer: Medicare HMO | Admitting: Physician Assistant

## 2018-01-31 ENCOUNTER — Inpatient Hospital Stay: Payer: Medicare HMO | Attending: Hematology

## 2018-01-31 VITALS — BP 142/76 | HR 71 | Temp 98.3°F | Resp 20

## 2018-01-31 DIAGNOSIS — I110 Hypertensive heart disease with heart failure: Secondary | ICD-10-CM | POA: Insufficient documentation

## 2018-01-31 DIAGNOSIS — N920 Excessive and frequent menstruation with regular cycle: Secondary | ICD-10-CM | POA: Insufficient documentation

## 2018-01-31 DIAGNOSIS — G4730 Sleep apnea, unspecified: Secondary | ICD-10-CM | POA: Diagnosis not present

## 2018-01-31 DIAGNOSIS — J449 Chronic obstructive pulmonary disease, unspecified: Secondary | ICD-10-CM | POA: Insufficient documentation

## 2018-01-31 DIAGNOSIS — E039 Hypothyroidism, unspecified: Secondary | ICD-10-CM | POA: Insufficient documentation

## 2018-01-31 DIAGNOSIS — I509 Heart failure, unspecified: Secondary | ICD-10-CM | POA: Insufficient documentation

## 2018-01-31 DIAGNOSIS — K219 Gastro-esophageal reflux disease without esophagitis: Secondary | ICD-10-CM | POA: Insufficient documentation

## 2018-01-31 DIAGNOSIS — Z87442 Personal history of urinary calculi: Secondary | ICD-10-CM | POA: Insufficient documentation

## 2018-01-31 DIAGNOSIS — D500 Iron deficiency anemia secondary to blood loss (chronic): Secondary | ICD-10-CM | POA: Diagnosis not present

## 2018-01-31 DIAGNOSIS — D869 Sarcoidosis, unspecified: Secondary | ICD-10-CM | POA: Insufficient documentation

## 2018-01-31 DIAGNOSIS — Z79899 Other long term (current) drug therapy: Secondary | ICD-10-CM | POA: Diagnosis not present

## 2018-01-31 MED ORDER — SODIUM CHLORIDE 0.9 % IV SOLN
510.0000 mg | Freq: Once | INTRAVENOUS | Status: DC
  Filled 2018-01-31: qty 17

## 2018-01-31 MED ORDER — SODIUM CHLORIDE 0.9 % IV SOLN
INTRAVENOUS | Status: DC
  Administered 2018-01-31: 09:00:00 via INTRAVENOUS

## 2018-01-31 NOTE — Patient Instructions (Signed)

## 2018-02-02 ENCOUNTER — Other Ambulatory Visit: Payer: Self-pay | Admitting: Pulmonary Disease

## 2018-02-07 ENCOUNTER — Inpatient Hospital Stay: Payer: Medicare HMO

## 2018-02-07 VITALS — BP 137/70 | HR 72 | Temp 98.2°F | Resp 20

## 2018-02-07 DIAGNOSIS — I509 Heart failure, unspecified: Secondary | ICD-10-CM | POA: Diagnosis not present

## 2018-02-07 DIAGNOSIS — N920 Excessive and frequent menstruation with regular cycle: Secondary | ICD-10-CM | POA: Diagnosis not present

## 2018-02-07 DIAGNOSIS — E039 Hypothyroidism, unspecified: Secondary | ICD-10-CM | POA: Diagnosis not present

## 2018-02-07 DIAGNOSIS — J449 Chronic obstructive pulmonary disease, unspecified: Secondary | ICD-10-CM | POA: Diagnosis not present

## 2018-02-07 DIAGNOSIS — D869 Sarcoidosis, unspecified: Secondary | ICD-10-CM | POA: Diagnosis not present

## 2018-02-07 DIAGNOSIS — D500 Iron deficiency anemia secondary to blood loss (chronic): Secondary | ICD-10-CM | POA: Diagnosis not present

## 2018-02-07 DIAGNOSIS — G4730 Sleep apnea, unspecified: Secondary | ICD-10-CM | POA: Diagnosis not present

## 2018-02-07 DIAGNOSIS — K219 Gastro-esophageal reflux disease without esophagitis: Secondary | ICD-10-CM | POA: Diagnosis not present

## 2018-02-07 DIAGNOSIS — I110 Hypertensive heart disease with heart failure: Secondary | ICD-10-CM | POA: Diagnosis not present

## 2018-02-07 MED ORDER — SODIUM CHLORIDE 0.9 % IV SOLN
510.0000 mg | Freq: Once | INTRAVENOUS | Status: AC
  Administered 2018-02-07: 510 mg via INTRAVENOUS
  Filled 2018-02-07: qty 17

## 2018-02-07 MED ORDER — SODIUM CHLORIDE 0.9 % IV SOLN
INTRAVENOUS | Status: DC
  Administered 2018-02-07: 09:00:00 via INTRAVENOUS

## 2018-02-07 NOTE — Patient Instructions (Signed)

## 2018-02-08 DIAGNOSIS — G4733 Obstructive sleep apnea (adult) (pediatric): Secondary | ICD-10-CM | POA: Diagnosis not present

## 2018-02-08 DIAGNOSIS — E669 Obesity, unspecified: Secondary | ICD-10-CM | POA: Diagnosis not present

## 2018-02-08 NOTE — Progress Notes (Signed)
Cardiology Office Note   Date:  02/09/2018   ID:  [name], DOB September 17, 1966, MRN [id number]  PCP:  [name], NP  Cardiologist: Dr. [name]      Chief Complaint  Patient presents with  . Hospitalization Follow-up    diastolic HF      History of Present Illness: [name] is a 52 y.o. female who presents for post hospitalization.   She has a hx of anemia, diastolic heart failure, chronic respiratory failure on 2 L home oxygen, obesity hypoventilation syndrome, OSA on CPAP, morbid obesity, GERD, hypertension, hypothyroidism, sarcoidosis was admitted to [facility].   For acute respiratory failure and diastolic CHF Diuresed,  EF 55-60%.  PA pk 35 mmHg   Wt at discharge 470 lbs.    Need close follow up BMP per discharge and she has not had as of yet.   SOB, but better than usual.  No chest pain.  She is watching the salt in her diet.  Uses her CPAP every night. Some days she can walk without 02 but other times she cannot leave her house.  Only dizzy when she goes to bed at night.  But not often.  We discussed wt loss, she has tried weight watchers,  but cost is an issue, briefly we discussed [facility] wt loss program. Currently not interested.     Her wt is up here but on her scales her wt has been consistent, at 470.  If her wt increases by 3-5 lbs in a day she will take an extra 20 of lasix.  Her sp02 without 02 is 99% here.        Past Medical History:  Diagnosis Date  . Anemia   . Arthritis    hands, shoulders, no meds  . CHF (congestive heart failure) (HCC)    EF60-65%  . Chronic hyperventilation syndrome    w/ obesity tx with albuterol inhaler and oxygen 2L  . COPD (chronic obstructive pulmonary disease) ([place])    uses oxygen 2 L  . Gallstones   . GERD (gastroesophageal reflux disease)    diet controlled - no meds  . H/O hiatal hernia   . Hypertension   . Hypothyroidism   . Kidney stones   . Morbid obesity ([place])   . Pneumonia     hoispitalized in 08/2011  . Sarcoidosis   . Seasonal allergies   . Sleep apnea    uses CPAP machine     Past Surgical History:  Procedure Laterality Date  . [place]   x 1  . CHOLECYSTECTOMY  2000  . DILATION AND CURETTAGE OF UTERUS N/A 08/09/2016   Procedure: DILATATION AND CURETTAGE;  Surgeon: Everitt Amber, MD;  Location: [facility];  Service: Gynecology;  Laterality: N/A;  . HYSTEROSCOPY W/D&C  12/27/2011   Procedure: DILATATION AND CURETTAGE /HYSTEROSCOPY;  Surgeon: Maeola Sarah. Landry Mellow, MD;  Location: [facility];  Service: Gynecology;;  . I and D of abcess  05/2011  . INTRAUTERINE DEVICE (IUD) INSERTION N/A 08/09/2016   Procedure: INTRAUTERINE DEVICE (IUD) INSERTION;  Surgeon: Everitt Amber, MD;  Location: [facility];  Service: Gynecology;  Laterality: N/A;  . LUNG BIOPSY    . uterine abletion       Current Outpatient Medications  Medication Sig Dispense Refill  . albuterol (PROAIR HFA) 108 (90 Base) MCG/ACT inhaler Inhale 1-2 puffs into the lungs every 6 hours as needed for wheezing. 8.5 g 0  . cetirizine (ZYRTEC) 10 MG tablet Take  10 mg by mouth at bedtime as needed for allergies.    . cholecalciferol (VITAMIN D) 400 UNITS TABS tablet Take 400 Units by mouth daily.    Marland Kitchen Coral Calcium 1000 (390 CA) MG TABS Take 1,000 mg by mouth daily.     . famotidine (PEPCID) 40 MG tablet Take 1 tablet (40 mg total) by mouth 2 (two) times daily. 60 tablet 0  . folic acid (FOLVITE) 1 MG tablet Take 1 mg by mouth daily.  3  . furosemide (LASIX) 20 MG tablet Take 3 tablets (60 mg total) by mouth 2 (two) times daily. 180 tablet 1  . HYDROcodone-acetaminophen (NORCO) 10-325 MG tablet Take 1 tablet by mouth every 6 (six) hours as needed (pain). 30 tablet 0  . levothyroxine (SYNTHROID, LEVOTHROID) 100 MCG tablet TAKE 1 TABLET (100 MCG TOTAL) BY MOUTH DAILY AT 6 (SIX) AM. 30 tablet 0  . losartan (COZAAR) 100 MG tablet Take 1 tablet (100 mg total) daily by mouth. 90 tablet 1  . methotrexate 50 MG/2ML injection  Inject 15 mg into the skin every Friday. Injects 0.41ml.  3  . metoprolol succinate (TOPROL-XL) 25 MG 24 hr tablet TAKE 1 TABLET BY MOUTH EVERY DAY 30 tablet 0  . NON FORMULARY 2 liter of oxygen    . predniSONE (DELTASONE) 10 MG tablet Take 1 tablet (10 mg total) by mouth daily. 10 tablet 0  . triamcinolone acetonide (KENALOG) 40 MG/ML injection Inject 40 mg into the muscle every 3 (three) months. For knee pain    . triamcinolone ointment (KENALOG) 0.1 % Apply 1 application topically See admin instructions. Applies twice a day as needed for break out of Sarcoidosis    . vitamin B-12 1000 MCG tablet Take 1 tablet (1,000 mcg total) by mouth daily. 30 tablet 0   No current facility-administered medications for this visit.     Allergies:   Patient has no known allergies.    Social History:  The patient  reports that she has never smoked. She has never used smokeless tobacco. She reports that she drinks alcohol. She reports that she does not use drugs.   Family History:  The patient's family history includes Aneurysm in her sister; Cancer (age of onset: 90) in her father; Deep vein thrombosis in her mother; Diabetes in her brother and father.    ROS:  General:no colds or fevers, no weight changes- per pt's home scales.  Skin:no rashes or ulcers HEENT:no blurred vision, no congestion CV:see HPI PUL:see HPI GI:no diarrhea constipation or melena, no indigestion GU:no hematuria, no dysuria MS:no joint pain, no claudication Neuro:no syncope, occ lightheadedness Endo:no diabetes, + thyroid disease  Wt Readings from Last 3 Encounters:  02/09/18 (!) 480 lb (217.7 kg)  01/17/18 (!) 470 lb 14.4 oz (213.6 kg)  09/13/17 (!) 497 lb 6.4 oz (225.6 kg)     PHYSICAL EXAM: VS:  BP 126/82   Pulse 78   Ht 5' 5.5" (1.664 m)   Wt (!) 480 lb (217.7 kg)   SpO2 99%   BMI 78.66 kg/m  , BMI Body mass index is 78.66 kg/m. General:[place] affect, NAD Skin:Warm and dry, brisk capillary  refill HEENT:normocephalic, sclera clear, mucus membranes moist Neck:supple, no JVD, no bruits  Heart:S1S2 RRR without murmur, gallup, rub or click Lungs:clear to diminished without rales, rhonchi, or wheezes QMV:HQION, soft, non tender, + BS, do not palpate liver spleen or masses Ext:no lower ext edema,- no pitting , 2+ radial pulses Neuro:alert and oriented X 3, MAE,  follows commands, + facial symmetry    EKG:  EKG is NOT ordered today.    Recent Labs: 01/13/2018: TSH 1.992 01/14/2018: B Natriuretic Peptide 11.1 01/16/2018: ALT 17; BUN 26; Hemoglobin 11.6; Magnesium 2.0; Platelets 184; Potassium 4.0; Sodium 142 01/17/2018: Creatinine, Ser 1.28    Lipid Panel    Component Value Date/Time   CHOL 261 (H) 03/02/2017 0817   TRIG 130.0 03/02/2017 0817   HDL 63.20 03/02/2017 0817   CHOLHDL 4 03/02/2017 0817   VLDL 26.0 03/02/2017 0817   LDLCALC 172 (H) 03/02/2017 0817       Other studies Reviewed: Additional studies/ records that were reviewed today include: .  Echo 01/11/18  Study Conclusions  - Left ventricle: The cavity size was normal. Wall thickness was   increased in a pattern of mild LVH. Systolic function was normal.   The estimated ejection fraction was in the range of 55% to 60%.   Wall motion was normal; there were no regional wall motion   abnormalities. - Aortic valve: Trileaflet; mildly calcified leaflets. - Right ventricle: The cavity size was mildly dilated. - Pulmonary arteries: Systolic pressure was mildly increased. PA   peak pressure: 35 mm Hg (S). - Pericardium, extracardiac: A trivial pericardial effusion was   identified.  ASSESSMENT AND PLAN:  1.  Recent acute diastolic HF, now chronic.  BP controlled, edema controlled.  She had hypokalemia in the hospital --will recheck labs.  Continue 60 mg lasix BID if Cr increasing may need to decrease.  She will follow up with Dr. [name] in 1 year unless problems.  She has multiple MD appts and would prefer  to hold cards unless an issue.  This seems reasonable.  2.  OSA uses CPAP followed by Pulmonary.  3.  Chronic respiratory failure followed by pulmonary with home oxygen  4.  Morbid obesity, discussed diet but difficult for her.     Current medicines are reviewed with the patient today.  The patient Has no concerns regarding medicines.  The following changes have been made:  See above Labs/ tests ordered today include:see above  Disposition:   FU:  see above  Signed, Cecilie Kicks, NP  02/09/2018 8:10 AM    [facility] [address] [place] [place], [place] Phone: [phone]; Fax: [phone]

## 2018-02-09 ENCOUNTER — Ambulatory Visit (INDEPENDENT_AMBULATORY_CARE_PROVIDER_SITE_OTHER): Payer: Medicare HMO | Admitting: Cardiology

## 2018-02-09 ENCOUNTER — Encounter: Payer: Self-pay | Admitting: Cardiology

## 2018-02-09 VITALS — BP 126/82 | HR 78 | Ht 65.5 in | Wt >= 6400 oz

## 2018-02-09 DIAGNOSIS — G4733 Obstructive sleep apnea (adult) (pediatric): Secondary | ICD-10-CM

## 2018-02-09 DIAGNOSIS — E876 Hypokalemia: Secondary | ICD-10-CM

## 2018-02-09 DIAGNOSIS — Z9989 Dependence on other enabling machines and devices: Secondary | ICD-10-CM

## 2018-02-09 DIAGNOSIS — J9611 Chronic respiratory failure with hypoxia: Secondary | ICD-10-CM | POA: Diagnosis not present

## 2018-02-09 DIAGNOSIS — I5033 Acute on chronic diastolic (congestive) heart failure: Secondary | ICD-10-CM

## 2018-02-09 LAB — BASIC METABOLIC PANEL
BUN / CREAT RATIO: 18 (ref 9–23)
BUN: 12 mg/dL (ref 6–24)
CO2: 25 mmol/L (ref 20–29)
Calcium: 8.8 mg/dL (ref 8.7–10.2)
Chloride: 99 mmol/L (ref 96–106)
Creatinine, Ser: 0.66 mg/dL (ref 0.57–1.00)
GFR calc non Af Amer: 102 mL/min/{1.73_m2} (ref >59)
GFR, EST AFRICAN AMERICAN: 117 mL/min/{1.73_m2} (ref >59)
Glucose: 94 mg/dL (ref 65–99)
Potassium: 3.8 mmol/L (ref 3.5–5.2)
SODIUM: 140 mmol/L (ref 134–144)

## 2018-02-09 NOTE — Progress Notes (Signed)
Pt has been made aware of normal result and verbalized understanding.  jw 02/09/18

## 2018-02-09 NOTE — Patient Instructions (Signed)
Medication Instructions:   Your physician recommends that you continue on your current medications as directed. Please refer to the Current Medication list given to you today.   If you need a refill on your cardiac medications before your next appointment, please call your pharmacy.  Labwork:  BMET TODAY     Testing/Procedures: NONE ORDERED  TODAY    Follow-Up:  WITH DR [name] IN ONE YEAR CONTACT [facility] [phone] AS NEEDED FOR  ANY CARDIAC RELATED SYMPTOMS    Any Other Special Instructions Will Be Listed Below (If Applicable).

## 2018-02-19 ENCOUNTER — Other Ambulatory Visit: Payer: Medicare HMO

## 2018-02-19 ENCOUNTER — Ambulatory Visit: Payer: Medicare HMO | Admitting: Hematology

## 2018-02-20 NOTE — Progress Notes (Signed)
[facility]  [phone] [phone]  Clinic Follow Up Note   Patient Care Team: [name], NP as PCP - General (Family Medicine) Jerline Pain, MD as PCP - Cardiology (Cardiology) Wonda Horner, MD (Gastroenterology) Rigoberto Noel, MD as Consulting Physician (Pulmonary Disease) 02/21/2018  CHIEF COMPLAINTS:  Follow up anemia   HISTORY OF PRESENTING ILLNESS (06/23/2016):  [name] 52 y.o. female with past medical history of sarcoidosis, on chronic continuous oxygen 2 L/min, morbid obesity, congestive heart failure, here because of her anemia. She was referred by her PCP.   She has had anemia since she was in her 65's. She has very heavy menstrual period, the last about 7 days, she has to change a pad 5-6 times a day, and she contributes her anemia to her heavy period. She underwent endometrial ablation once in the past, which did not work. She would like to have a hysterectomy, which has not been offered by her gynecologist. She has been taking iron pill one or twice a day, but not very compliant. Her anemia has been getting worse lately, she was admitted to hospital twice for severe anemia in the past year, and received blood transfusion and IV Feraheme in 12/2014 and 06/17/2016. She has been following with her PCP for her anemia.   She denies recent chest pain on exertion, shortness of breath on minimal exertion, pre-syncopal episodes, or palpitations. She had not noticed any other recent bleeding such as epistaxis, hematuria or hematochezia The patient denies over the counter NSAID ingestion. She is not on antiplatelets agents. She had no prior history or diagnosis of cancer. Her age appropriate screening programs are up-to-date. She denies any pica and eats a variety of diet. She never donated blood or received blood transfusion  CURRENT THERAPY: iv Feraheme 510mg  as needed, received on 01/16/2015, 06/17/2016, 07/01/2016, 05/04/17,  05/11/17, 08/16/17, 08/23/17, 11/03/17, 11/09/17, 01/31/18, 02/07/18  INTERIM HISTORY:  [name] returns for follow up of her iron deficient anemia. She presents to the clinic today by herself. She reports she went to the hospital last month for SOB. She states she feels much better now and she is being followed by a cardiologist. She also reports she has had more energy since receiving IV iron last month and her period has not been as heavy.   On review of systems, pt denies abnormal bleeding, or any other complaints at this time. Pertinent positives are listed and detailed within the above HPI.   MEDICAL HISTORY:  Past Medical History:  Diagnosis Date  . Anemia   . Arthritis    hands, shoulders, no meds  . CHF (congestive heart failure) (HCC)    EF60-65%  . Chronic hyperventilation syndrome    w/ obesity tx with albuterol inhaler and oxygen 2L  . COPD (chronic obstructive pulmonary disease) ([place])    uses oxygen 2 L  . Gallstones   . GERD (gastroesophageal reflux disease)    diet controlled - no meds  . H/O hiatal hernia   . Hypertension   . Hypothyroidism   . Kidney stones   . Morbid obesity ([place])   . Pneumonia    hoispitalized in 08/2011  . Sarcoidosis   . Seasonal allergies   . Sleep apnea    uses CPAP machine     SURGICAL HISTORY: Past Surgical History:  Procedure Laterality Date  . [place]   x 1  . CHOLECYSTECTOMY  2000  . DILATION AND CURETTAGE  OF UTERUS N/A 08/09/2016   Procedure: DILATATION AND CURETTAGE;  Surgeon: Everitt Amber, MD;  [facility];  Service: Gynecology;  Laterality: N/A;  . HYSTEROSCOPY W/D&C  12/27/2011   Procedure: DILATATION AND CURETTAGE /HYSTEROSCOPY;  Surgeon: Maeola Sarah. Landry Mellow, MD;  Location: [facility];  Service: Gynecology;;  . I and D of abcess  05/2011  . INTRAUTERINE DEVICE (IUD) INSERTION N/A 08/09/2016   Procedure: INTRAUTERINE DEVICE (IUD) INSERTION;  Surgeon: Everitt Amber, MD;  [facility];  Service: Gynecology;   Laterality: N/A;  . LUNG BIOPSY    . uterine abletion      SOCIAL HISTORY: Social History   Socioeconomic History  . Marital status: Single    Spouse name: Not on file  . Number of children: Not on file  . Years of education: Not on file  . Highest education level: Not on file  Occupational History  . Occupation: unemployeed  Social Needs  . Financial resource strain: Not on file  . Food insecurity:    Worry: Not on file    Inability: Not on file  . Transportation needs:    Medical: Not on file    Non-medical: Not on file  Tobacco Use  . Smoking status: Never Smoker  . Smokeless tobacco: Never Used  Substance and Sexual Activity  . Alcohol use: Yes    Comment: occasionally  . Drug use: No  . Sexual activity: Never    Birth control/protection: None  Lifestyle  . Physical activity:    Days per week: Not on file    Minutes per session: Not on file  . Stress: Not on file  Relationships  . Social connections:    Talks on phone: Not on file    Gets together: Not on file    Attends religious service: Not on file    Active member of club or organization: Not on file    Attends meetings of clubs or organizations: Not on file    Relationship status: Not on file  . Intimate partner violence:    Fear of current or ex partner: Not on file    Emotionally abused: Not on file    Physically abused: Not on file    Forced sexual activity: Not on file  Other Topics Concern  . Not on file  Social History Narrative   Son lives with patient.   Divorced for eight or nine years   Works from home for [employer].     FAMILY HISTORY: Family History  Problem Relation Age of Onset  . Diabetes Father   . Cancer Father 54       colon cancer   . Diabetes Brother   . Deep vein thrombosis Mother   . Aneurysm Sister        d/o brain aneurysm    ALLERGIES:  has No Known Allergies.  MEDICATIONS:  Current Outpatient Medications  Medication Sig Dispense Refill  . albuterol (PROAIR  HFA) 108 (90 Base) MCG/ACT inhaler Inhale 1-2 puffs into the lungs every 6 hours as needed for wheezing. 8.5 g 0  . cetirizine (ZYRTEC) 10 MG tablet Take 10 mg by mouth at bedtime as needed for allergies.    . cholecalciferol (VITAMIN D) 400 UNITS TABS tablet Take 400 Units by mouth daily.    Marland Kitchen Coral Calcium 1000 (390 CA) MG TABS Take 1,000 mg by mouth daily.     . famotidine (PEPCID) 40 MG tablet Take 1 tablet (40 mg total) by mouth 2 (two)  times daily. 60 tablet 0  . folic acid (FOLVITE) 1 MG tablet Take 1 mg by mouth daily.  3  . furosemide (LASIX) 20 MG tablet Take 3 tablets (60 mg total) by mouth 2 (two) times daily. 180 tablet 1  . HYDROcodone-acetaminophen (NORCO) 10-325 MG tablet Take 1 tablet by mouth every 6 (six) hours as needed (pain). 30 tablet 0  . levothyroxine (SYNTHROID, LEVOTHROID) 100 MCG tablet TAKE 1 TABLET (100 MCG TOTAL) BY MOUTH DAILY AT 6 (SIX) AM. 30 tablet 0  . losartan (COZAAR) 100 MG tablet Take 1 tablet (100 mg total) daily by mouth. 90 tablet 1  . methotrexate 50 MG/2ML injection Inject 15 mg into the skin every Friday. Injects 0.53ml.  3  . metoprolol succinate (TOPROL-XL) 25 MG 24 hr tablet TAKE 1 TABLET BY MOUTH EVERY DAY 30 tablet 0  . NON FORMULARY 2 liter of oxygen    . predniSONE (DELTASONE) 10 MG tablet Take 1 tablet (10 mg total) by mouth daily. 10 tablet 0  . triamcinolone acetonide (KENALOG) 40 MG/ML injection Inject 40 mg into the muscle every 3 (three) months. For knee pain    . triamcinolone ointment (KENALOG) 0.1 % Apply 1 application topically See admin instructions. Applies twice a day as needed for break out of Sarcoidosis    . vitamin B-12 1000 MCG tablet Take 1 tablet (1,000 mcg total) by mouth daily. 30 tablet 0   No current facility-administered medications for this visit.     REVIEW OF SYSTEMS:   Constitutional: Denies fevers, chills or abnormal night sweats Eyes: Denies blurriness of vision, double vision or watery eyes Ears, nose, mouth,  throat, and face: Denies mucositis or sore throat Respiratory: Denies cough, dyspnea or wheezes Cardiovascular: Denies palpitation, chest discomfort or lower extremity swelling Gastrointestinal:  Denies nausea, heartburn or change in bowel habits Skin: Denies abnormal skin rashes Lymphatics: Denies new lymphadenopathy or easy bruising Reproductive: (+) heavy periods  Neurological:Denies numbness, tingling or new weaknesses Behavioral/Psych: Mood is stable, no new changes  All other systems were reviewed with the patient and are negative.  PHYSICAL EXAMINATION: ECOG PERFORMANCE STATUS: 1 - Symptomatic but completely ambulatory  Vitals:   02/21/18 0822  BP: (!) 169/80  Pulse: 85  Resp: 19  Temp: 98.9 F (37.2 C)  SpO2: 95%   Filed Weights   02/21/18 0822  Weight: (!) 480 lb 12.8 oz (218.1 kg)     GENERAL:alert, no distress and comfortable, Morbidly obese female, on nasal cannula oxygen SKIN: skin color, texture, turgor are normal, no rashes or significant lesions EYES: normal, conjunctiva are pink and non-injected, sclera clear OROPHARYNX:no exudate, no erythema and lips, buccal mucosa, and tongue normal  NECK: supple, thyroid normal size, non-tender, without nodularity LYMPH:  no palpable lymphadenopathy in the cervical, axillary or inguinal LUNGS: clear to auscultation and percussion with normal breathing effort HEART: regular rate & rhythm and no murmurs and no lower extremity edema ABDOMEN:abdomen soft, non-tender and normal bowel sounds Musculoskeletal:no cyanosis of digits and no clubbing  PSYCH: alert & oriented x 3 with fluent speech NEURO: no focal motor/sensory deficits  LABORATORY DATA:  I have reviewed the data as listed CBC Latest Ref Rng & Units 02/21/2018 01/16/2018 01/15/2018  WBC 3.9 - 10.3 K/uL 7.8 7.0 6.6  Hemoglobin 11.6 - 15.9 g/dL 12.3 11.6(L) 11.8(L)  Hematocrit 34.8 - 46.6 % 38.6 39.4 38.6  Platelets 145 - 400 K/uL 197 184 182    CMP Latest Ref Rng  & Units 02/09/2018  01/17/2018 01/16/2018  Glucose 65 - 99 mg/dL 94 - 97  BUN 6 - 24 mg/dL 12 - 26(H)  Creatinine 0.57 - 1.00 mg/dL 0.66 1.28(H) 0.82  Sodium 134 - 144 mmol/L 140 - 142  Potassium 3.5 - 5.2 mmol/L 3.8 - 4.0  Chloride 96 - 106 mmol/L 99 - 96(L)  CO2 20 - 29 mmol/L 25 - 34(H)  Calcium 8.7 - 10.2 mg/dL 8.8 - 9.5  Total Protein 6.5 - 8.1 g/dL - - 7.1  Total Bilirubin 0.3 - 1.2 mg/dL - - 0.8  Alkaline Phos 38 - 126 U/L - - 34(L)  AST 15 - 41 U/L - - 15  ALT 14 - 54 U/L - - 17   Results for Kim, Macias (MRN [id number]) as of 02/21/2018 21:51  Ref. Range 11/29/2017 08:27 01/14/2018 05:11 02/21/2018 07:57  Iron Latest Ref Range: 41 - 142 ug/dL  45 49  UIBC Latest Units: ug/dL  260 211  TIBC Latest Ref Range: 236 - 444 ug/dL  305 260  Saturation Ratios Latest Ref Range: 21 - 57 %  15 19 (L)  Ferritin Latest Ref Range: 9 - 269 ng/mL 99 26 289 (H)  Folate Latest Ref Range: >5.9 ng/mL  12.0     RADIOGRAPHIC STUDIES: I have personally reviewed the radiological images as listed and agreed with the findings in the report. No results found.    CT Chest WO Contrast 02/14/17 IMPRESSION: 1. No infiltrate or pulmonary edema. Bilateral upper lobe linear atelectasis right greater than left. There are artifacts from patient's large body habitus. 2. Mild atherosclerotic calcifications of thoracic aorta. Cardiomegaly is noted. No evidence of aortic aneurysm. 3. No mediastinal hematoma or adenopathy.  Patent central airway. 4. Mild degenerative changes thoracic spine.  ASSESSMENT & PLAN:  52 y.o. premenopause female, presented with chronic anemia, heavy menstrual periods, and worsening anemia lately, and required hospitalization and blood transfusions.  1. Iron deficient anemia from Menorrhagia -Her multiple iron studies previously showed very low level of ferritin, serum iron level and transferrin saturation, her MCV is low, consistent with iron deficient anemia. -Her iron  deficiency is likely from her menorrhagia -She is not very compliant with oral iron supplement, responded well to IV iron -she has tried IUD, but did not help much and it was removed, her menorrhagia has improved some since then  -She has required IV iron every 2-4 months in 2018. She tolerated the infusions well. Oral Iron makes her nauseated so she plans to only proceed with IV iron.   -I previously discussed her anemia is most likely related to her menorrhagia, she will see GYN and discuss ways to control this.  -Her Hg today is normal at 12.3, her Ferritin is pending, will consider IV iron if ferritin less than 50. She is s/p two doses of IV iron on 4/3 and 4/10 so I do not suspect that she will need it again right now -She states her periods have been lighter, I discussed her anemia should be improving with lighter periods  -She is 51 and has not had a colonoscopy yet, she is hesitant. I recommend she have a stool sample test done by her PCP soon. She is agreeable -Will do lab every 6 weeks -F/u in 6 months    2. Hypertension, pulmonary sarcomatosis, congestive heart failure morbid obesity -She'll continue follow-up with her primary care physician and other doctors  -She was admitted to the hospital for Hypoxia 02/11/17-02/16/17 with SOB and wheezing,  -admitted again  01/10/18 - 01/17/18 for SOB, on furosemide  -BP elevated today in clinic, she states she ha snot taken her HTN medications today yet and she usually takes them every day at 9 AM.    Plan  -Lab every 6 weeks x4, will set up iv iron if ferritin<50 or low iron level  -F/u in 6 months  -Stool OB test with PCP   All questions were answered. The patient knows to call the clinic with any problems, questions or concerns.  I spent 15 minutes counseling the patient face to face. The total time spent in the appointment was 20 minutes and more than 50% was on counseling.  This document serves as a record of services personally  performed by Truitt Merle, MD. It was created on her behalf by Theresia Bough, a trained medical scribe. The creation of this record is based on the scribe's personal observations and the provider's statements to them.   I have reviewed the above documentation for accuracy and completeness, and I agree with the above.    Truitt Merle, MD 02/21/2018

## 2018-02-21 ENCOUNTER — Inpatient Hospital Stay: Payer: Medicare HMO

## 2018-02-21 ENCOUNTER — Telehealth: Payer: Self-pay | Admitting: Hematology

## 2018-02-21 ENCOUNTER — Encounter: Payer: Self-pay | Admitting: Hematology

## 2018-02-21 ENCOUNTER — Inpatient Hospital Stay (HOSPITAL_BASED_OUTPATIENT_CLINIC_OR_DEPARTMENT_OTHER): Payer: Medicare HMO | Admitting: Hematology

## 2018-02-21 VITALS — BP 169/80 | HR 85 | Temp 98.9°F | Resp 19 | Ht 65.5 in | Wt >= 6400 oz

## 2018-02-21 DIAGNOSIS — N920 Excessive and frequent menstruation with regular cycle: Secondary | ICD-10-CM | POA: Diagnosis not present

## 2018-02-21 DIAGNOSIS — J449 Chronic obstructive pulmonary disease, unspecified: Secondary | ICD-10-CM | POA: Diagnosis not present

## 2018-02-21 DIAGNOSIS — D500 Iron deficiency anemia secondary to blood loss (chronic): Secondary | ICD-10-CM

## 2018-02-21 DIAGNOSIS — E039 Hypothyroidism, unspecified: Secondary | ICD-10-CM | POA: Diagnosis not present

## 2018-02-21 DIAGNOSIS — Z79899 Other long term (current) drug therapy: Secondary | ICD-10-CM | POA: Diagnosis not present

## 2018-02-21 DIAGNOSIS — D869 Sarcoidosis, unspecified: Secondary | ICD-10-CM | POA: Diagnosis not present

## 2018-02-21 DIAGNOSIS — K219 Gastro-esophageal reflux disease without esophagitis: Secondary | ICD-10-CM | POA: Diagnosis not present

## 2018-02-21 DIAGNOSIS — I509 Heart failure, unspecified: Secondary | ICD-10-CM | POA: Diagnosis not present

## 2018-02-21 DIAGNOSIS — I110 Hypertensive heart disease with heart failure: Secondary | ICD-10-CM | POA: Diagnosis not present

## 2018-02-21 DIAGNOSIS — G4730 Sleep apnea, unspecified: Secondary | ICD-10-CM | POA: Diagnosis not present

## 2018-02-21 LAB — FERRITIN: FERRITIN: 289 ng/mL — AB (ref 9–269)

## 2018-02-21 LAB — IRON AND TIBC
Iron: 49 ug/dL (ref 41–142)
SATURATION RATIOS: 19 % — AB (ref 21–57)
TIBC: 260 ug/dL (ref 236–444)
UIBC: 211 ug/dL

## 2018-02-21 LAB — CBC WITH DIFFERENTIAL (CANCER CENTER ONLY)
BASOS ABS: 0 10*3/uL (ref 0.0–0.1)
BASOS PCT: 0 %
EOS ABS: 0 10*3/uL (ref 0.0–0.5)
EOS PCT: 0 %
HCT: 38.6 % (ref 34.8–46.6)
Hemoglobin: 12.3 g/dL (ref 11.6–15.9)
Lymphocytes Relative: 6 %
Lymphs Abs: 0.5 10*3/uL — ABNORMAL LOW (ref 0.9–3.3)
MCH: 27.3 pg (ref 25.1–34.0)
MCHC: 31.9 g/dL (ref 31.5–36.0)
MCV: 85.6 fL (ref 79.5–101.0)
Monocytes Absolute: 0.1 10*3/uL (ref 0.1–0.9)
Monocytes Relative: 2 %
Neutro Abs: 7.1 10*3/uL — ABNORMAL HIGH (ref 1.5–6.5)
Neutrophils Relative %: 92 %
PLATELETS: 197 10*3/uL (ref 145–400)
RBC: 4.51 MIL/uL (ref 3.70–5.45)
RDW: 20.1 % — ABNORMAL HIGH (ref 11.2–14.5)
WBC: 7.8 10*3/uL (ref 3.9–10.3)

## 2018-02-21 LAB — RETICULOCYTES
RBC.: 4.51 MIL/uL (ref 3.70–5.45)
RETIC COUNT ABSOLUTE: 72.2 10*3/uL (ref 33.7–90.7)
RETIC CT PCT: 1.6 % (ref 0.7–2.1)

## 2018-02-21 NOTE — Telephone Encounter (Signed)
Appointments scheduled Calendar/letter printed and mailed to patient per 4/24 los °

## 2018-02-22 ENCOUNTER — Telehealth: Payer: Self-pay

## 2018-02-22 NOTE — Telephone Encounter (Signed)
-----   Message from Truitt Merle, MD sent at 02/21/2018 11:25 PM EDT ----- Please let pt know her iron level is adequate, no need iv iron for now. Thanks   Truitt Merle  02/21/2018

## 2018-02-22 NOTE — Telephone Encounter (Signed)
Per Dr. Burr Medico notified patient that her iron level at this time is adequate and there is not need for an IV iron infusion at this time.

## 2018-03-02 DIAGNOSIS — G4733 Obstructive sleep apnea (adult) (pediatric): Secondary | ICD-10-CM | POA: Diagnosis not present

## 2018-03-14 DIAGNOSIS — Z79899 Other long term (current) drug therapy: Secondary | ICD-10-CM | POA: Diagnosis not present

## 2018-03-14 DIAGNOSIS — R69 Illness, unspecified: Secondary | ICD-10-CM | POA: Diagnosis not present

## 2018-03-14 DIAGNOSIS — D860 Sarcoidosis of lung: Secondary | ICD-10-CM | POA: Diagnosis not present

## 2018-03-14 DIAGNOSIS — R0602 Shortness of breath: Secondary | ICD-10-CM | POA: Diagnosis not present

## 2018-03-14 DIAGNOSIS — Z6841 Body Mass Index (BMI) 40.0 and over, adult: Secondary | ICD-10-CM | POA: Diagnosis not present

## 2018-03-14 DIAGNOSIS — M064 Inflammatory polyarthropathy: Secondary | ICD-10-CM | POA: Diagnosis not present

## 2018-03-14 DIAGNOSIS — M159 Polyosteoarthritis, unspecified: Secondary | ICD-10-CM | POA: Diagnosis not present

## 2018-03-18 ENCOUNTER — Other Ambulatory Visit: Payer: Self-pay | Admitting: Adult Health

## 2018-03-20 NOTE — Telephone Encounter (Signed)
At last fill you only allowed 30 tabs until seen.  Deny this med?

## 2018-03-21 NOTE — Telephone Encounter (Signed)
Ok to fill for 30 days  

## 2018-03-21 NOTE — Telephone Encounter (Signed)
Sent to the pharmacy by e-scribe. 

## 2018-03-25 ENCOUNTER — Other Ambulatory Visit: Payer: Self-pay | Admitting: Adult Health

## 2018-03-27 NOTE — Telephone Encounter (Signed)
Sent to the pharmacy by e-scribe for 30 days as directed.

## 2018-03-27 NOTE — Telephone Encounter (Signed)
30 days.

## 2018-04-04 ENCOUNTER — Inpatient Hospital Stay: Payer: Medicare HMO | Attending: Hematology

## 2018-04-04 DIAGNOSIS — D500 Iron deficiency anemia secondary to blood loss (chronic): Secondary | ICD-10-CM | POA: Insufficient documentation

## 2018-04-04 DIAGNOSIS — N920 Excessive and frequent menstruation with regular cycle: Secondary | ICD-10-CM | POA: Diagnosis not present

## 2018-04-04 LAB — RETICULOCYTES
RBC.: 4.31 MIL/uL (ref 3.70–5.45)
RETIC CT PCT: 1.5 % (ref 0.7–2.1)
Retic Count, Absolute: 64.7 10*3/uL (ref 33.7–90.7)

## 2018-04-04 LAB — FERRITIN: Ferritin: 80 ng/mL (ref 9–269)

## 2018-04-04 LAB — CBC WITH DIFFERENTIAL (CANCER CENTER ONLY)
BASOS PCT: 0 %
Basophils Absolute: 0 10*3/uL (ref 0.0–0.1)
EOS ABS: 0 10*3/uL (ref 0.0–0.5)
Eosinophils Relative: 1 %
HCT: 37.7 % (ref 34.8–46.6)
Hemoglobin: 11.8 g/dL (ref 11.6–15.9)
LYMPHS ABS: 0.5 10*3/uL — AB (ref 0.9–3.3)
Lymphocytes Relative: 8 %
MCH: 27.4 pg (ref 25.1–34.0)
MCHC: 31.3 g/dL — AB (ref 31.5–36.0)
MCV: 87.5 fL (ref 79.5–101.0)
MONO ABS: 0.3 10*3/uL (ref 0.1–0.9)
MONOS PCT: 4 %
Neutro Abs: 5.7 10*3/uL (ref 1.5–6.5)
Neutrophils Relative %: 87 %
Platelet Count: 193 10*3/uL (ref 145–400)
RBC: 4.31 MIL/uL (ref 3.70–5.45)
RDW: 19.3 % — AB (ref 11.2–14.5)
WBC Count: 6.5 10*3/uL (ref 3.9–10.3)

## 2018-04-09 ENCOUNTER — Telehealth: Payer: Self-pay | Admitting: *Deleted

## 2018-04-09 NOTE — Telephone Encounter (Signed)
-----   Message from Truitt Merle, MD sent at 04/08/2018  9:27 PM EDT ----- Please let pt know her lab results. Her iron level is good, but lower than one month ago. She does not need iv iron for now, but may need in the near future, continue lab monitoring, thanks  Truitt Merle  04/08/2018

## 2018-04-09 NOTE — Telephone Encounter (Signed)
LVM with patient stating most recent iron level is good, no IV iron indicated at this time.  Informed pt to call if in need of lab values or additional information.  Call back number provided.

## 2018-04-15 DIAGNOSIS — J9620 Acute and chronic respiratory failure, unspecified whether with hypoxia or hypercapnia: Secondary | ICD-10-CM | POA: Diagnosis not present

## 2018-04-15 DIAGNOSIS — I5033 Acute on chronic diastolic (congestive) heart failure: Secondary | ICD-10-CM | POA: Diagnosis not present

## 2018-04-15 DIAGNOSIS — E662 Morbid (severe) obesity with alveolar hypoventilation: Secondary | ICD-10-CM | POA: Diagnosis not present

## 2018-04-15 DIAGNOSIS — J9621 Acute and chronic respiratory failure with hypoxia: Secondary | ICD-10-CM | POA: Diagnosis not present

## 2018-04-15 DIAGNOSIS — G4733 Obstructive sleep apnea (adult) (pediatric): Secondary | ICD-10-CM | POA: Diagnosis not present

## 2018-04-15 DIAGNOSIS — I10 Essential (primary) hypertension: Secondary | ICD-10-CM | POA: Diagnosis not present

## 2018-04-15 DIAGNOSIS — E038 Other specified hypothyroidism: Secondary | ICD-10-CM | POA: Diagnosis not present

## 2018-04-15 DIAGNOSIS — D869 Sarcoidosis, unspecified: Secondary | ICD-10-CM | POA: Diagnosis not present

## 2018-04-16 ENCOUNTER — Inpatient Hospital Stay (HOSPITAL_COMMUNITY)
Admission: EM | Admit: 2018-04-16 | Discharge: 2018-04-20 | DRG: 291 | Disposition: A | Discharge status: Home / Self Care | Payer: Medicare HMO | Attending: Internal Medicine | Admitting: Internal Medicine

## 2018-04-16 ENCOUNTER — Encounter (HOSPITAL_COMMUNITY): Payer: Self-pay | Admitting: Emergency Medicine

## 2018-04-16 ENCOUNTER — Emergency Department (HOSPITAL_COMMUNITY): Payer: Medicare HMO

## 2018-04-16 ENCOUNTER — Other Ambulatory Visit: Payer: Self-pay

## 2018-04-16 DIAGNOSIS — Z79899 Other long term (current) drug therapy: Secondary | ICD-10-CM

## 2018-04-16 DIAGNOSIS — J449 Chronic obstructive pulmonary disease, unspecified: Secondary | ICD-10-CM | POA: Diagnosis present

## 2018-04-16 DIAGNOSIS — M19012 Primary osteoarthritis, left shoulder: Secondary | ICD-10-CM | POA: Diagnosis present

## 2018-04-16 DIAGNOSIS — J810 Acute pulmonary edema: Secondary | ICD-10-CM | POA: Diagnosis present

## 2018-04-16 DIAGNOSIS — Z7952 Long term (current) use of systemic steroids: Secondary | ICD-10-CM

## 2018-04-16 DIAGNOSIS — D869 Sarcoidosis, unspecified: Secondary | ICD-10-CM | POA: Diagnosis present

## 2018-04-16 DIAGNOSIS — R000 Tachycardia, unspecified: Secondary | ICD-10-CM | POA: Diagnosis not present

## 2018-04-16 DIAGNOSIS — J9811 Atelectasis: Secondary | ICD-10-CM | POA: Diagnosis not present

## 2018-04-16 DIAGNOSIS — M19041 Primary osteoarthritis, right hand: Secondary | ICD-10-CM | POA: Diagnosis present

## 2018-04-16 DIAGNOSIS — E662 Morbid (severe) obesity with alveolar hypoventilation: Secondary | ICD-10-CM | POA: Diagnosis present

## 2018-04-16 DIAGNOSIS — R0602 Shortness of breath: Secondary | ICD-10-CM

## 2018-04-16 DIAGNOSIS — I110 Hypertensive heart disease with heart failure: Secondary | ICD-10-CM | POA: Diagnosis not present

## 2018-04-16 DIAGNOSIS — Z9989 Dependence on other enabling machines and devices: Secondary | ICD-10-CM

## 2018-04-16 DIAGNOSIS — R0682 Tachypnea, not elsewhere classified: Secondary | ICD-10-CM | POA: Diagnosis not present

## 2018-04-16 DIAGNOSIS — D509 Iron deficiency anemia, unspecified: Secondary | ICD-10-CM | POA: Diagnosis present

## 2018-04-16 DIAGNOSIS — I10 Essential (primary) hypertension: Secondary | ICD-10-CM | POA: Diagnosis present

## 2018-04-16 DIAGNOSIS — M069 Rheumatoid arthritis, unspecified: Secondary | ICD-10-CM | POA: Diagnosis present

## 2018-04-16 DIAGNOSIS — E785 Hyperlipidemia, unspecified: Secondary | ICD-10-CM | POA: Diagnosis present

## 2018-04-16 DIAGNOSIS — Z9981 Dependence on supplemental oxygen: Secondary | ICD-10-CM

## 2018-04-16 DIAGNOSIS — J9621 Acute and chronic respiratory failure with hypoxia: Secondary | ICD-10-CM | POA: Diagnosis present

## 2018-04-16 DIAGNOSIS — K219 Gastro-esophageal reflux disease without esophagitis: Secondary | ICD-10-CM | POA: Diagnosis present

## 2018-04-16 DIAGNOSIS — I5033 Acute on chronic diastolic (congestive) heart failure: Secondary | ICD-10-CM | POA: Diagnosis present

## 2018-04-16 DIAGNOSIS — I509 Heart failure, unspecified: Secondary | ICD-10-CM

## 2018-04-16 DIAGNOSIS — R0689 Other abnormalities of breathing: Secondary | ICD-10-CM | POA: Diagnosis not present

## 2018-04-16 DIAGNOSIS — Z6841 Body Mass Index (BMI) 40.0 and over, adult: Secondary | ICD-10-CM

## 2018-04-16 DIAGNOSIS — G4733 Obstructive sleep apnea (adult) (pediatric): Secondary | ICD-10-CM | POA: Diagnosis present

## 2018-04-16 DIAGNOSIS — E039 Hypothyroidism, unspecified: Secondary | ICD-10-CM | POA: Diagnosis present

## 2018-04-16 DIAGNOSIS — M19042 Primary osteoarthritis, left hand: Secondary | ICD-10-CM | POA: Diagnosis present

## 2018-04-16 DIAGNOSIS — J069 Acute upper respiratory infection, unspecified: Secondary | ICD-10-CM | POA: Diagnosis present

## 2018-04-16 DIAGNOSIS — M19011 Primary osteoarthritis, right shoulder: Secondary | ICD-10-CM | POA: Diagnosis present

## 2018-04-16 LAB — I-STAT TROPONIN, ED: TROPONIN I, POC: 0 ng/mL (ref 0.00–0.08)

## 2018-04-16 NOTE — ED Triage Notes (Signed)
Patient BIB EMS from home with complaints of shortness of breath and possible CHF exacerbation. Patient states shortness of breath x 3 days that has gotten progressively worse. Dyspnea with exertion and at rest. Patient 91% RA and 95% on 4L [place]. Patient recently admitted and had fluid taken off, states this feels similar. Patient able to stand and pivot via EMS. Per EMS lung sounds clear.

## 2018-04-16 NOTE — ED Provider Notes (Signed)
[facility] DEPT Provider Note   CSN: [id number] Arrival date & time: 04/16/18  2120     History   Chief Complaint Chief Complaint  Patient presents with  . Shortness of Breath    HPI [name] is a 52 y.o. female.  Patient presents to the emergency department with a chief complaint of shortness of breath.  She states that she has had gradually worsening shortness of breath for the past 3 days.  She reports associated dyspnea on exertion.  She states that she typically wears 2 L of oxygen 24/7, but has had to increase this to 4 L over the past few days.  She states that she cannot walk more than a few steps without becoming very short of breath.  She states that she has had some low-grade fevers and has had cough.  She denies any new lower extremity swelling.  She states that her body weight so been fairly consistent.  She denies any pain.  She states that she had a cold last week and had a fever to 101.  The history is provided by the patient. No language interpreter was used.    Past Medical History:  Diagnosis Date  . Anemia   . Arthritis    hands, shoulders, no meds  . CHF (congestive heart failure) (HCC)    EF60-65%  . Chronic hyperventilation syndrome    w/ obesity tx with albuterol inhaler and oxygen 2L  . COPD (chronic obstructive pulmonary disease) ([place])    uses oxygen 2 L  . Gallstones   . GERD (gastroesophageal reflux disease)    diet controlled - no meds  . H/O hiatal hernia   . Hypertension   . Hypothyroidism   . Kidney stones   . Morbid obesity ([place])   . Pneumonia    hoispitalized in 08/2011  . Sarcoidosis   . Seasonal allergies   . Sleep apnea    uses CPAP machine     Patient Active Problem List   Diagnosis Date Noted  . Diastolic CHF ([place]) 00/86/7619  . CHF (congestive heart failure) ([place]) 01/10/2018  . Unilateral primary osteoarthritis, left knee 02/20/2017  . Unilateral primary osteoarthritis, right  knee 02/20/2017  . Chronic pain of right knee 02/20/2017  . COPD exacerbation ([place]) 02/12/2017  . GERD (gastroesophageal reflux disease) 02/12/2017  . Abnormal uterine bleeding 08/09/2016  . Diastolic dysfunction with acute on chronic heart failure ([place]) 06/17/2016  . Microcytic anemia 02/27/2016  . Abdominal pain 02/27/2016  . Chronic diastolic CHF (congestive heart failure) ([place]) 02/27/2016  . Hyperlipidemia 02/24/2016  . Chronic respiratory failure ([place]) 09/21/2015  . Absolute anemia   . Anxiety 04/14/2015  . SOB (shortness of breath)   . Acute on chronic diastolic CHF (congestive heart failure) ([place]) 01/18/2015  . Hypoxemia   . Congestive heart failure ([place])   . Symptomatic anemia 01/15/2015  . Hypothyroidism 01/15/2015  . Morbid obesity ([place]) 01/15/2015  . Fibroids 12/09/2013  . Hyperglycemia 09/16/2013  . Left knee pain 09/09/2012  . Sarcoidosis 09/09/2012  . Iron deficiency anemia due to chronic blood loss 09/09/2012  . Hypertension 02/25/2011  . Obesity hypoventilation syndrome ([place]) 02/14/2011  . OSA (obstructive sleep apnea) 02/10/2011    Past Surgical History:  Procedure Laterality Date  . [place]   x 1  . CHOLECYSTECTOMY  2000  . DILATION AND CURETTAGE OF UTERUS N/A 08/09/2016   Procedure: DILATATION AND CURETTAGE;  Surgeon: Everitt Amber, MD;  Location: [facility];  Service: Gynecology;  Laterality: N/A;  . HYSTEROSCOPY W/D&C  12/27/2011   Procedure: DILATATION AND CURETTAGE /HYSTEROSCOPY;  Surgeon: Maeola Sarah. Landry Mellow, MD;  Location: [facility];  Service: Gynecology;;  . I and D of abcess  05/2011  . INTRAUTERINE DEVICE (IUD) INSERTION N/A 08/09/2016   Procedure: INTRAUTERINE DEVICE (IUD) INSERTION;  Surgeon: Everitt Amber, MD;  Location: [facility];  Service: Gynecology;  Laterality: N/A;  . LUNG BIOPSY    . uterine abletion       OB History   None      Home Medications    Prior to Admission medications   Medication Sig Start Date End Date Taking? Authorizing  Provider  albuterol (PROAIR HFA) 108 (90 Base) MCG/ACT inhaler Inhale 1-2 puffs into the lungs every 6 hours as needed for wheezing. 01/08/18  Yes Rigoberto Noel, MD  Cyanocobalamin (VITAMIN B-12) 5000 MCG TBDP Take 1 tablet by mouth daily.   Yes [provider]  diphenhydrAMINE (BENADRYL) 25 mg capsule Take 25 mg by mouth every 6 (six) hours as needed (fever).   Yes [provider]  famotidine (PEPCID) 40 MG tablet Take 1 tablet (40 mg total) by mouth 2 (two) times daily. 02/16/17  Yes Barton Dubois, MD  furosemide (LASIX) 20 MG tablet Take 3 tablets (60 mg total) by mouth 2 (two) times daily. 02/16/17  Yes Barton Dubois, MD  levothyroxine (SYNTHROID, LEVOTHROID) 100 MCG tablet TAKE 1 TABLET (100 MCG TOTAL) BY MOUTH DAILY AT 6 (SIX) AM. 09/27/17  Yes Nafziger, Tommi Rumps, NP  losartan (COZAAR) 100 MG tablet TAKE 1 TABLET (100 MG TOTAL) DAILY BY MOUTH. 03/27/18  Yes Nafziger, Tommi Rumps, NP  metoprolol succinate (TOPROL-XL) 25 MG 24 hr tablet TAKE 1 TABLET BY MOUTH EVERY DAY 03/21/18  Yes Nafziger, Tommi Rumps, NP  cetirizine (ZYRTEC) 10 MG tablet Take 10 mg by mouth at bedtime as needed for allergies.    [provider]  cholecalciferol (VITAMIN D) 400 UNITS TABS tablet Take 400 Units by mouth daily.    [provider]  Coral Calcium 1000 (390 CA) MG TABS Take 1,000 mg by mouth daily.     [provider]  diclofenac sodium (VOLTAREN) 1 % GEL USE AS DIRECTED 4 TIMES A DAY AS NEEDED 03/18/18   [provider]  folic acid (FOLVITE) 1 MG tablet Take 1 mg by mouth daily. 01/11/16   [provider]  HYDROcodone-acetaminophen (NORCO) 10-325 MG tablet Take 1 tablet by mouth every 6 (six) hours as needed (pain). 11/21/16   Pete Pelt, PA-C  methotrexate 250 MG/10ML injection INJECT 0.8CC ONCE A WEEK INJECTION 30 DAYS 03/20/18   [provider]  methotrexate 50 MG/2ML injection Inject 15 mg into the skin every Friday. Injects 0.2ml. 12/13/15   [provider]  NON FORMULARY 2 liter of oxygen    [provider]  predniSONE (DELTASONE) 1 MG tablet TAKE 4 TABLETS WITH FOOD OR MILK ONCE A DAY 03/26/18   [provider]  predniSONE (DELTASONE) 10 MG tablet Take 1 tablet (10 mg total) by mouth daily. 01/17/18   Florencia Reasons, MD  triamcinolone acetonide (KENALOG) 40 MG/ML injection Inject 40 mg into the muscle every 3 (three) months. For knee pain    [provider]  triamcinolone ointment (KENALOG) 0.1 % Apply 1 application topically See admin instructions. Applies twice a day as needed for break out of Sarcoidosis    [provider]  vitamin B-12 1000 MCG tablet Take 1 tablet (  1,000 mcg total) by mouth daily. Patient not taking: Reported on 04/16/2018 06/19/16   L[place]ina Hamman, MD    Family History Family History  Problem Relation Age of Onset  . Diabetes Father   . Cancer Father 23       colon cancer   . Diabetes Brother   . Deep vein thrombosis Mother   . Aneurysm Sister        d/o brain aneurysm    Social History Social History   Tobacco Use  . Smoking status: Never Smoker  . Smokeless tobacco: Never Used  Substance Use Topics  . Alcohol use: Yes    Comment: occasionally  . Drug use: No     Allergies   Patient has no known allergies.   Review of Systems Review of Systems  All other systems reviewed and are negative.    Physical Exam Updated Vital Signs BP (!) 183/99 (BP Location: Left Wrist)   Pulse (!) 113   Temp 99.4 F (37.4 C) (Oral)   Resp (!) 25   Ht 5' 5.5" (1.664 m)   Wt (!) 218.2 kg (481 lb)   SpO2 97%   BMI 78.83 kg/m   Physical Exam  Constitutional: She is oriented to person, place, and time. She appears well-developed and well-nourished.  Morbidly obese  HENT:  Head: Normocephalic and atraumatic.  Eyes: Pupils are equal, round, and reactive to light. Conjunctivae and EOM are normal.  Neck: Normal range of motion. Neck supple.  Cardiovascular: Normal rate  and regular rhythm. Exam reveals no gallop and no friction rub.  No murmur heard. Pulmonary/Chest: Effort normal and breath sounds normal. No respiratory distress. She has no wheezes. She has no rales. She exhibits no tenderness.  Abdominal: Soft. Bowel sounds are normal. She exhibits no distension and no mass. There is no tenderness. There is no rebound and no guarding.  Musculoskeletal: Normal range of motion. She exhibits no edema or tenderness.  Neurological: She is alert and oriented to person, place, and time.  Skin: Skin is warm and dry.  Psychiatric: She has a normal mood and affect. Her behavior is normal. Judgment and thought content normal.  Nursing note and vitals reviewed.    ED Treatments / Results  Labs (all labs ordered are listed, but only abnormal results are displayed) Labs Reviewed  CBC WITH DIFFERENTIAL/PLATELET - Abnormal; Notable for the following components:      Result Value   Hemoglobin 11.8 (*)    RDW 18.2 (*)    Neutro Abs 8.7 (*)    All other components within normal limits  BASIC METABOLIC PANEL - Abnormal; Notable for the following components:   Glucose, Bld 109 (*)    All other components within normal limits  BRAIN NATRIURETIC PEPTIDE  I-STAT TROPONIN, ED    EKG None  Radiology Dg Chest 2 View  Result Date: 04/16/2018 CLINICAL DATA:  52 year old female with increasing shortness of breath for 1 week. EXAM: CHEST - 2 VIEW COMPARISON:  CTA chest 01/15/2018 and earlier. FINDINGS: Semi upright AP and lateral views of the chest. Stable cardiomegaly and mediastinal contours. Chronic dependent opacity along the minor fissure most resembles atelectasis. No pneumothorax. No pleural effusion. Pulmonary vascularity appears stable over this series of exams. Crowding of lower lung markings on the lateral view. No consolidation identified. No acute osseous abnormality identified. IMPRESSION: 1. Pulmonary vascular congestion appears stable over this series of  exams. Mild if any acute pulmonary edema. 2. Cardiomegaly and atelectasis. Electronically Signed  By: Genevie Ann M.D.   On: 04/16/2018 22:35   Ct Angio Chest Pe W And/or Wo Contrast  Result Date: 04/17/2018 CLINICAL DATA:  Increasing shortness of breath for 3 days. Recent admission to have fluid taken off. History of hypertension, congestive heart failure, COPD, gastroesophageal reflux disease, pneumonia, sarcoidosis, lung biopsy. EXAM: CT ANGIOGRAPHY CHEST WITH CONTRAST TECHNIQUE: Multidetector CT imaging of the chest was performed using the standard protocol during bolus administration of intravenous contrast. Multiplanar CT image reconstructions and MIPs were obtained to evaluate the vascular anatomy. CONTRAST:  168mL ISOVUE-370 IOPAMIDOL (ISOVUE-370) INJECTION 76% COMPARISON:  01/15/2018 FINDINGS: Cardiovascular: Evaluation of pulmonary arteries is limited due to poor contrast bolus and motion artifact. There is moderately good opacification of the central pulmonary arteries and no large central filling defects are demonstrated. Diffuse cardiac enlargement. No pericardial effusions. Normal caliber thoracic aorta. Great vessel origins are patent. Mediastinum/Nodes: Esophagus is decompressed. No significant lymphadenopathy in the chest. Diffuse enlargement of the thyroid gland likely representing thyroid goiter. Similar appearance to previous study. Lungs/Pleura: Evaluation is limited due to motion artifact. There is patchy mosaic attenuation pattern to the lungs. This can indicate air trapping or edema or may be due to motion artifact. There is evidence of atelectasis or edema in the lung bases. Appearances are similar to previous study. No pleural effusions. No pneumothorax. Airways are patent. Upper Abdomen: No acute process demonstrated in the visualized upper abdomen. Musculoskeletal: Degenerative changes in the spine. No destructive bone lesions. Review of the MIP images confirms the above findings.  IMPRESSION: 1. Evaluation of pulmonary arteries is limited due to motion artifact and poor contrast bolus but there is no evidence of large central pulmonary embolus. 2. Cardiac enlargement. 3. Mosaic attenuation pattern to the lungs may be due to motion artifact, air trapping, or edema. Atelectasis or edema in the lung bases. Similar to previous study. Electronically Signed   By: Lucienne Capers M.D.   On: 04/17/2018 01:20    Procedures Procedures (including critical care time)  Medications Ordered in ED Medications - No data to display   Initial Impression / Assessment and Plan / ED Course  I have reviewed the triage vital signs and the nursing notes.  Pertinent labs & imaging results that were available during my care of the patient were reviewed by me and considered in my medical decision making (see chart for details).     Patient with CHF, COPD, shortness of breath.  Worsening over the past 3 days.  BNP is normal.  Troponin negative.  Chest x-ray shows vascular congestion, but no evidence of infection.  She has had low-grade fevers and remains tachycardic and tachypneic as well as with worsening hypoxia.  Patient remains tachypneic and tachycardic and short of breath.  Appreciate Dr. Tamala Julian for bringing patient into the hospital.  We will give some Lasix to see if this improves her symptoms.  Final Clinical Impressions(s) / ED Diagnoses   Final diagnoses:  Shortness of breath    ED Discharge Orders    None       Montine Circle, PA-C 04/17/18 0159    Tegeler, Gwenyth Allegra, MD 04/17/18 1247

## 2018-04-16 NOTE — ED Notes (Signed)
Bed: AY30 Expected date:  Expected time:  Means of arrival:  Comments: EMS 52 yo female SOB with hx CHF

## 2018-04-17 ENCOUNTER — Emergency Department (HOSPITAL_COMMUNITY): Payer: Medicare HMO

## 2018-04-17 DIAGNOSIS — J9620 Acute and chronic respiratory failure, unspecified whether with hypoxia or hypercapnia: Secondary | ICD-10-CM

## 2018-04-17 DIAGNOSIS — K219 Gastro-esophageal reflux disease without esophagitis: Secondary | ICD-10-CM | POA: Diagnosis present

## 2018-04-17 DIAGNOSIS — M19041 Primary osteoarthritis, right hand: Secondary | ICD-10-CM | POA: Diagnosis present

## 2018-04-17 DIAGNOSIS — M19042 Primary osteoarthritis, left hand: Secondary | ICD-10-CM | POA: Diagnosis present

## 2018-04-17 DIAGNOSIS — E662 Morbid (severe) obesity with alveolar hypoventilation: Secondary | ICD-10-CM | POA: Diagnosis not present

## 2018-04-17 DIAGNOSIS — E038 Other specified hypothyroidism: Secondary | ICD-10-CM | POA: Diagnosis not present

## 2018-04-17 DIAGNOSIS — J449 Chronic obstructive pulmonary disease, unspecified: Secondary | ICD-10-CM | POA: Diagnosis present

## 2018-04-17 DIAGNOSIS — I509 Heart failure, unspecified: Secondary | ICD-10-CM

## 2018-04-17 DIAGNOSIS — E785 Hyperlipidemia, unspecified: Secondary | ICD-10-CM | POA: Diagnosis not present

## 2018-04-17 DIAGNOSIS — I5033 Acute on chronic diastolic (congestive) heart failure: Secondary | ICD-10-CM | POA: Diagnosis not present

## 2018-04-17 DIAGNOSIS — Z7952 Long term (current) use of systemic steroids: Secondary | ICD-10-CM | POA: Diagnosis not present

## 2018-04-17 DIAGNOSIS — J810 Acute pulmonary edema: Secondary | ICD-10-CM | POA: Diagnosis not present

## 2018-04-17 DIAGNOSIS — R0602 Shortness of breath: Secondary | ICD-10-CM | POA: Diagnosis not present

## 2018-04-17 DIAGNOSIS — I110 Hypertensive heart disease with heart failure: Secondary | ICD-10-CM | POA: Diagnosis not present

## 2018-04-17 DIAGNOSIS — Z79899 Other long term (current) drug therapy: Secondary | ICD-10-CM | POA: Diagnosis not present

## 2018-04-17 DIAGNOSIS — Z9989 Dependence on other enabling machines and devices: Secondary | ICD-10-CM | POA: Diagnosis not present

## 2018-04-17 DIAGNOSIS — E039 Hypothyroidism, unspecified: Secondary | ICD-10-CM | POA: Diagnosis not present

## 2018-04-17 DIAGNOSIS — G4733 Obstructive sleep apnea (adult) (pediatric): Secondary | ICD-10-CM

## 2018-04-17 DIAGNOSIS — J9621 Acute and chronic respiratory failure with hypoxia: Secondary | ICD-10-CM | POA: Diagnosis not present

## 2018-04-17 DIAGNOSIS — M069 Rheumatoid arthritis, unspecified: Secondary | ICD-10-CM | POA: Diagnosis present

## 2018-04-17 DIAGNOSIS — Z9981 Dependence on supplemental oxygen: Secondary | ICD-10-CM | POA: Diagnosis not present

## 2018-04-17 DIAGNOSIS — Z6841 Body Mass Index (BMI) 40.0 and over, adult: Secondary | ICD-10-CM | POA: Diagnosis not present

## 2018-04-17 DIAGNOSIS — M19012 Primary osteoarthritis, left shoulder: Secondary | ICD-10-CM | POA: Diagnosis present

## 2018-04-17 DIAGNOSIS — D509 Iron deficiency anemia, unspecified: Secondary | ICD-10-CM | POA: Diagnosis present

## 2018-04-17 DIAGNOSIS — J069 Acute upper respiratory infection, unspecified: Secondary | ICD-10-CM | POA: Diagnosis present

## 2018-04-17 DIAGNOSIS — D869 Sarcoidosis, unspecified: Secondary | ICD-10-CM | POA: Diagnosis not present

## 2018-04-17 DIAGNOSIS — M19011 Primary osteoarthritis, right shoulder: Secondary | ICD-10-CM | POA: Diagnosis present

## 2018-04-17 DIAGNOSIS — I10 Essential (primary) hypertension: Secondary | ICD-10-CM | POA: Diagnosis not present

## 2018-04-17 LAB — CBC WITH DIFFERENTIAL/PLATELET
BASOS ABS: 0 10*3/uL (ref 0.0–0.1)
BASOS PCT: 0 %
Basophils Absolute: 0 10*3/uL (ref 0.0–0.1)
Basophils Relative: 0 %
EOS ABS: 0 10*3/uL (ref 0.0–0.7)
EOS PCT: 2 %
EOS PCT: 0 %
Eosinophils Absolute: 0.2 10*3/uL (ref 0.0–0.7)
HCT: 40.3 % (ref 36.0–46.0)
HEMATOCRIT: 37.2 % (ref 36.0–46.0)
Hemoglobin: 11.8 g/dL — ABNORMAL LOW (ref 12.0–15.0)
Hemoglobin: 12.9 g/dL (ref 12.0–15.0)
LYMPHS ABS: 0.5 10*3/uL — AB (ref 0.7–4.0)
Lymphocytes Relative: 11 %
Lymphocytes Relative: 5 %
Lymphs Abs: 1.1 10*3/uL (ref 0.7–4.0)
MCH: 28 pg (ref 26.0–34.0)
MCH: 28.2 pg (ref 26.0–34.0)
MCHC: 31.7 g/dL (ref 30.0–36.0)
MCHC: 32 g/dL (ref 30.0–36.0)
MCV: 88.4 fL (ref 78.0–100.0)
MCV: 88.2 fL (ref 78.0–100.0)
MONO ABS: 0.5 10*3/uL (ref 0.1–1.0)
MONO ABS: 0.2 10*3/uL (ref 0.1–1.0)
MONOS PCT: 1 %
Monocytes Relative: 5 %
Neutro Abs: 8.7 10*3/uL — ABNORMAL HIGH (ref 1.7–7.7)
Neutro Abs: 10.3 10*3/uL — ABNORMAL HIGH (ref 1.7–7.7)
Neutrophils Relative %: 82 %
Neutrophils Relative %: 94 %
PLATELETS: 208 10*3/uL (ref 150–400)
Platelets: 205 10*3/uL (ref 150–400)
RBC: 4.21 MIL/uL (ref 3.87–5.11)
RBC: 4.57 MIL/uL (ref 3.87–5.11)
RDW: 18.2 % — AB (ref 11.5–15.5)
RDW: 18.3 % — AB (ref 11.5–15.5)
WBC: 10.5 10*3/uL (ref 4.0–10.5)
WBC: 11 10*3/uL — ABNORMAL HIGH (ref 4.0–10.5)

## 2018-04-17 LAB — TROPONIN I: Troponin I: <0.03 ng/mL (ref <0.03)

## 2018-04-17 LAB — BASIC METABOLIC PANEL
Anion gap: 9 (ref 5–15)
BUN: 14 mg/dL (ref 6–20)
CO2: 30 mmol/L (ref 22–32)
CREATININE: 0.75 mg/dL (ref 0.44–1.00)
Calcium: 8.9 mg/dL (ref 8.9–10.3)
Chloride: 103 mmol/L (ref 101–111)
GFR calc non Af Amer: >60 mL/min (ref >60)
Glucose, Bld: 109 mg/dL — ABNORMAL HIGH (ref 65–99)
Potassium: 3.6 mmol/L (ref 3.5–5.1)
SODIUM: 142 mmol/L (ref 135–145)

## 2018-04-17 LAB — BRAIN NATRIURETIC PEPTIDE: B Natriuretic Peptide: 36 pg/mL (ref 0.0–100.0)

## 2018-04-17 LAB — TSH: TSH: 2.744 u[IU]/mL (ref 0.350–4.500)

## 2018-04-17 MED ORDER — HYDROCODONE-ACETAMINOPHEN 10-325 MG PO TABS
1.0000 | ORAL_TABLET | Freq: Four times a day (QID) | ORAL | Status: DC | PRN
  Filled 2018-04-17 (×2): qty 1

## 2018-04-17 MED ORDER — ONDANSETRON HCL 4 MG/2ML IJ SOLN
4.0000 mg | Freq: Four times a day (QID) | INTRAMUSCULAR | Status: DC | PRN
  Filled 2018-04-17: qty 2

## 2018-04-17 MED ORDER — IOPAMIDOL (ISOVUE-370) INJECTION 76%
INTRAVENOUS | Status: AC
  Filled 2018-04-17: qty 100

## 2018-04-17 MED ORDER — METHYLPREDNISOLONE SODIUM SUCC 125 MG IJ SOLR
125.0000 mg | Freq: Once | INTRAMUSCULAR | Status: AC
Start: 2018-04-17 — End: 2018-04-17
  Administered 2018-04-17: 125 mg via INTRAVENOUS
  Filled 2018-04-17: qty 2

## 2018-04-17 MED ORDER — LEVOTHYROXINE SODIUM 100 MCG PO TABS
100.0000 ug | ORAL_TABLET | Freq: Every day | ORAL | Status: DC
  Administered 2018-04-17 – 2018-04-20 (×4): 100 ug via ORAL
  Filled 2018-04-17 (×4): qty 1

## 2018-04-17 MED ORDER — LOSARTAN POTASSIUM 50 MG PO TABS
100.0000 mg | ORAL_TABLET | Freq: Every day | ORAL | Status: DC
  Administered 2018-04-17 – 2018-04-20 (×4): 100 mg via ORAL
  Filled 2018-04-17 (×3): qty 2

## 2018-04-17 MED ORDER — FUROSEMIDE 10 MG/ML IJ SOLN
60.0000 mg | Freq: Two times a day (BID) | INTRAMUSCULAR | Status: DC
  Administered 2018-04-17 – 2018-04-20 (×7): 60 mg via INTRAVENOUS
  Filled 2018-04-17 (×7): qty 6

## 2018-04-17 MED ORDER — ACETAMINOPHEN 325 MG PO TABS: 650.0000 mg | ORAL_TABLET | ORAL | Status: DC | PRN

## 2018-04-17 MED ORDER — IOPAMIDOL (ISOVUE-370) INJECTION 76%
100.0000 mL | Freq: Once | INTRAVENOUS | Status: AC | PRN
  Administered 2018-04-17: 100 mL via INTRAVENOUS

## 2018-04-17 MED ORDER — SODIUM CHLORIDE 0.9% FLUSH
3.0000 mL | Freq: Two times a day (BID) | INTRAVENOUS | Status: DC
  Administered 2018-04-17 – 2018-04-20 (×8): 3 mL via INTRAVENOUS

## 2018-04-17 MED ORDER — IPRATROPIUM-ALBUTEROL 0.5-2.5 (3) MG/3ML IN SOLN: 3.0000 mL | RESPIRATORY_TRACT | Status: DC | PRN

## 2018-04-17 MED ORDER — POTASSIUM CHLORIDE CRYS ER 20 MEQ PO TBCR
40.0000 meq | EXTENDED_RELEASE_TABLET | Freq: Once | ORAL | Status: AC
  Administered 2018-04-17: 40 meq via ORAL
  Filled 2018-04-17: qty 2

## 2018-04-17 MED ORDER — FOLIC ACID 1 MG PO TABS
1.0000 mg | ORAL_TABLET | Freq: Every day | ORAL | Status: DC
  Administered 2018-04-17 – 2018-04-20 (×4): 1 mg via ORAL
  Filled 2018-04-17 (×4): qty 1

## 2018-04-17 MED ORDER — FAMOTIDINE 20 MG PO TABS
40.0000 mg | ORAL_TABLET | Freq: Two times a day (BID) | ORAL | Status: DC
  Administered 2018-04-17 – 2018-04-20 (×8): 40 mg via ORAL
  Filled 2018-04-17 (×8): qty 2

## 2018-04-17 MED ORDER — FUROSEMIDE 10 MG/ML IJ SOLN
40.0000 mg | INTRAMUSCULAR | Status: AC
  Administered 2018-04-17: 40 mg via INTRAVENOUS
  Filled 2018-04-17: qty 4

## 2018-04-17 MED ORDER — GUAIFENESIN ER 600 MG PO TB12
600.0000 mg | ORAL_TABLET | Freq: Two times a day (BID) | ORAL | Status: DC
  Administered 2018-04-17 – 2018-04-20 (×8): 600 mg via ORAL
  Filled 2018-04-17 (×8): qty 1

## 2018-04-17 MED ORDER — METOPROLOL SUCCINATE ER 25 MG PO TB24
25.0000 mg | ORAL_TABLET | Freq: Every day | ORAL | Status: DC
  Administered 2018-04-17 – 2018-04-20 (×4): 25 mg via ORAL
  Filled 2018-04-17 (×4): qty 1

## 2018-04-17 MED ORDER — METHYLPREDNISOLONE SODIUM SUCC 125 MG IJ SOLR
60.0000 mg | Freq: Four times a day (QID) | INTRAMUSCULAR | Status: DC
  Administered 2018-04-17 – 2018-04-18 (×5): 60 mg via INTRAVENOUS
  Filled 2018-04-17 (×4): qty 2

## 2018-04-17 MED ORDER — DIPHENHYDRAMINE HCL 25 MG PO CAPS
25.0000 mg | ORAL_CAPSULE | Freq: Four times a day (QID) | ORAL | Status: DC | PRN
Start: 2018-04-17 — End: 2018-04-20

## 2018-04-17 MED ORDER — DICLOFENAC SODIUM 1 % TD GEL
4.0000 g | Freq: Four times a day (QID) | TRANSDERMAL | Status: DC
  Administered 2018-04-17 – 2018-04-20 (×4): 4 g via TOPICAL
  Filled 2018-04-17: qty 100

## 2018-04-17 MED ORDER — SODIUM CHLORIDE 0.9% FLUSH: 3.0000 mL | INTRAVENOUS | Status: DC | PRN

## 2018-04-17 MED ORDER — SODIUM CHLORIDE 0.9 % IV SOLN
250.0000 mL | INTRAVENOUS | Status: DC | PRN
Start: 2018-04-17 — End: 2018-04-20

## 2018-04-17 MED ORDER — ENOXAPARIN SODIUM 100 MG/ML ~~LOC~~ SOLN
100.0000 mg | SUBCUTANEOUS | Status: DC
  Administered 2018-04-17 – 2018-04-18 (×2): 100 mg via SUBCUTANEOUS
  Filled 2018-04-17 (×3): qty 1

## 2018-04-17 NOTE — Care Management Note (Signed)
Case Management Note  Patient Details  Name: [name] MRN: [id number] Date of Birth: [birth year]  Subjective/Objective:  Pt admitted with CHF                  Action/Plan: Pt have PCS with [facility]. Pt declined HH at present. Pt states that [facility] may call her for CHF program.    Expected Discharge Date:  (unknown)               Expected Discharge Plan:  Home/Self Care  In-House Referral:  [facility]  Discharge planning Services  CM Consult  Post Acute Care Choice:    Choice offered to:  Patient  DME Arranged:    DME Agency:     HH Arranged:    [place] Agency:     Status of Service:  In process, will continue to follow  If discussed at Long Length of Stay Meetings, dates discussed:    Additional CommentsPurcell Mouton, RN 04/17/2018, 12:44 PM

## 2018-04-17 NOTE — H&P (Addendum)
History and Physical    [name]:[id number] DOB: 16-Mar-1966 DOA: 04/16/2018  Referring MD/NP/PA: Montine Circle, PA-C PCP: [name], NP  Patient coming from: Home via EMS  Chief Complaint: Shortness of breath  I have personally briefly reviewed patient's old medical records in [place]   HPI: [name] is a 52 y.o. female with medical history significant of HTN, HLD, diastolic CHF, chronic respiratory failure on 2 L of [place], sarcoidosis on chronic steroids, OHS/OSA  on CPAP, morbid obesity, GERD, and hypothyroidism; who presents with complaints of shortness of breath progressively worsened over the last 3 days.  Patient reports having a cold last week that she likely picked up from her son.  She reported having a productive cough with fever up to 101 F.  She called her PCP who recommended her taking Benadryl her symptoms with some improvement.  However, over the last 3 days he reports feeling more short of breath unable to sleep waking up coughing.  O2 saturations noted to be into the 70s on home 2 L of nasal cannula oxygen.  Patient reports bumping oxygen up to approximately 4 L without significant improvement of symptoms.  Associated symptoms include complaints of intermittent wheezing, continue productive cough, headache, and generalized malaise.  Denies any significant change in her weight, but feels symptoms are similar to when she came in in March for CHF exacerbation.  Denies any significant chest pain, rash, or leg swelling.   ED Course: Upon admission to the emergency department patient was seen to be afebrile, pulse 95-113, respirations 17-32, blood pressure of 167/85-190/108, and O2 saturations 91 to 99% on home.  Labs revealed WBC 10.5, hemoglobin 11.8, BNP 36, and troponin 0.  Chest x-ray showing cardiomegaly with pulmonary vascular congestion appearing stable, and mild if any pulmonary edema present.  CT angiogram of the chest no acute signs of  a pulmonary embolism and possible pulmonary edema versus atelectasis significant motion artifact noted.  Patient was given 40 mg of Lasix IV.  TRH was called to admit.    Review of Systems  Constitutional: Positive for fever and malaise/fatigue.  HENT: Negative for congestion and ear discharge.   Eyes: Negative for photophobia and discharge.  Respiratory: Positive for cough, sputum production, shortness of breath and wheezing.   Cardiovascular: Positive for orthopnea and PND. Negative for chest pain and leg swelling.  Gastrointestinal: Negative for abdominal pain, constipation, diarrhea, nausea and vomiting.  Genitourinary: Negative for dysuria and hematuria.  Musculoskeletal: Negative for falls and myalgias.  Skin: Negative for itching and rash.  Neurological: Positive for headaches. Negative for loss of consciousness.  Psychiatric/Behavioral: Negative for memory loss and substance abuse. The patient has insomnia.     Past Medical History:  Diagnosis Date  . Anemia   . Arthritis    hands, shoulders, no meds  . CHF (congestive heart failure) (HCC)    EF60-65%  . Chronic hyperventilation syndrome    w/ obesity tx with albuterol inhaler and oxygen 2L  . COPD (chronic obstructive pulmonary disease) ([place])    uses oxygen 2 L  . Gallstones   . GERD (gastroesophageal reflux disease)    diet controlled - no meds  . H/O hiatal hernia   . Hypertension   . Hypothyroidism   . Kidney stones   . Morbid obesity ([place])   . Pneumonia    hoispitalized in 08/2011  . Sarcoidosis   . Seasonal allergies   . Sleep apnea    uses CPAP  machine     Past Surgical History:  Procedure Laterality Date  . [place]   x 1  . CHOLECYSTECTOMY  2000  . DILATION AND CURETTAGE OF UTERUS N/A 08/09/2016   Procedure: DILATATION AND CURETTAGE;  Surgeon: Everitt Amber, MD;  Location: [facility];  Service: Gynecology;  Laterality: N/A;  . HYSTEROSCOPY W/D&C  12/27/2011   Procedure: DILATATION AND  CURETTAGE /HYSTEROSCOPY;  Surgeon: Maeola Sarah. Landry Mellow, MD;  Location: [facility];  Service: Gynecology;;  . I and D of abcess  05/2011  . INTRAUTERINE DEVICE (IUD) INSERTION N/A 08/09/2016   Procedure: INTRAUTERINE DEVICE (IUD) INSERTION;  Surgeon: Everitt Amber, MD;  Location: [facility];  Service: Gynecology;  Laterality: N/A;  . LUNG BIOPSY    . uterine abletion       reports that she has never smoked. She has never used smokeless tobacco. She reports that she drinks alcohol. She reports that she does not use drugs.  No Known Allergies  Family History  Problem Relation Age of Onset  . Diabetes Father   . Cancer Father 6       colon cancer   . Diabetes Brother   . Deep vein thrombosis Mother   . Aneurysm Sister        d/o brain aneurysm    Prior to Admission medications   Medication Sig Start Date End Date Taking? Authorizing Provider  albuterol (PROAIR HFA) 108 (90 Base) MCG/ACT inhaler Inhale 1-2 puffs into the lungs every 6 hours as needed for wheezing. 01/08/18  Yes Rigoberto Noel, MD  cetirizine (ZYRTEC) 10 MG tablet Take 10 mg by mouth at bedtime as needed for allergies.   Yes [provider]  cholecalciferol (VITAMIN D) 400 UNITS TABS tablet Take 400 Units by mouth daily.   Yes [provider]  Coral Calcium 1000 (390 CA) MG TABS Take 1,000 mg by mouth daily.    Yes [provider]  Cyanocobalamin (VITAMIN B-12) 5000 MCG TBDP Take 1 tablet by mouth once a week.    Yes [provider]  diclofenac sodium (VOLTAREN) 1 % GEL APPLY 1 APPLICATION 4 TIMES A DAY AS NEEDED PAIN 03/18/18  Yes [provider]  diphenhydrAMINE (BENADRYL) 25 mg capsule Take 25 mg by mouth every 6 (six) hours as needed (fever).   Yes [provider]  famotidine (PEPCID) 40 MG tablet Take 1 tablet (40 mg total) by mouth 2 (two) times daily. 02/16/17  Yes Barton Dubois, MD  folic acid (FOLVITE) 1 MG tablet Take 1 mg by mouth daily. 01/11/16  Yes [provider]   furosemide (LASIX) 20 MG tablet Take 3 tablets (60 mg total) by mouth 2 (two) times daily. 02/16/17  Yes Barton Dubois, MD  HYDROcodone-acetaminophen [facility]) 10-325 MG tablet Take 1 tablet by mouth every 6 (six) hours as needed (pain). 11/21/16  Yes Pete Pelt, PA-C  levothyroxine (SYNTHROID, LEVOTHROID) 100 MCG tablet TAKE 1 TABLET (100 MCG TOTAL) BY MOUTH DAILY AT 6 (SIX) AM. 09/27/17  Yes Nafziger, Tommi Rumps, NP  losartan (COZAAR) 100 MG tablet TAKE 1 TABLET (100 MG TOTAL) DAILY BY MOUTH. 03/27/18  Yes Nafziger, Tommi Rumps, NP  metoprolol succinate (TOPROL-XL) 25 MG 24 hr tablet TAKE 1 TABLET BY MOUTH EVERY DAY 03/21/18  Yes Nafziger, Tommi Rumps, NP  predniSONE (DELTASONE) 10 MG tablet Take 1 tablet (10 mg total) by mouth daily. 01/17/18  Yes Florencia Reasons, MD  methotrexate 250 MG/10ML injection INJECT 0.8CC ONCE A WEEK INJECTION 30 DAYS 03/20/18  [provider]  NON FORMULARY 2 liter of oxygen    [provider]  triamcinolone acetonide (KENALOG) 40 MG/ML injection Inject 40 mg into the muscle every 3 (three) months. For knee pain    [provider]  triamcinolone ointment (KENALOG) 0.1 % Apply 1 application topically See admin instructions. Applies twice a day as needed for break out of Sarcoidosis    [provider]  vitamin B-12 1000 MCG tablet Take 1 tablet (1,000 mcg total) by mouth daily. Patient not taking: Reported on 04/16/2018 06/19/16   Lavina Hamman, MD    Physical Exam:  Constitutional: Morbidly obese female who appears to be in some respiratory discomfort Vitals:   04/16/18 2300 04/16/18 2330 04/17/18 0000 04/17/18 0030  BP: (!) 190/108 (!) 168/85 (!) 167/85 (!) 177/84  Pulse: (!) 101 95  100  Resp:  (!) 32 (!) 26 (!) 26  Temp:      TempSrc:      SpO2: 97% 94%  97%  Weight:      Height:       Eyes: PERRL, lids and conjunctivae normal ENMT: Mucous membranes are moist. Posterior pharynx clear of any exudate or lesions. Neck: normal, supple, no masses,  no thyromegaly.  Cannot appreciate JVD Respiratory: Tachypneic with decreased overall aeration no significant wheezes or rhonchi appreciated.  Currently on 4 L with O2 saturation 97%. Cardiovascular: Regular rate and rhythm, no murmurs / rubs / gallops.  Trace lower extremity edema. 2+ pedal pulses. No carotid bruits.  Abdomen: no tenderness, no masses palpated. No hepatosplenomegaly. Bowel sounds positive.  Musculoskeletal: no clubbing / cyanosis. No joint deformity upper and lower extremities. Good ROM, no contractures. Normal muscle tone.  Skin: no rashes, lesions, ulcers. No induration Neurologic: CN 2-12 grossly intact. Sensation intact, DTR normal. Strength 5/5 in all 4.  Psychiatric: Normal judgment and insight. Alert and oriented x 3. Normal mood.     Labs on Admission: I have personally reviewed following labs and imaging studies  CBC: Recent Labs  Lab 04/16/18 2310  WBC 10.5  NEUTROABS 8.7*  HGB 11.8*  HCT 37.2  MCV 88.4  PLT 638   Basic Metabolic Panel: Recent Labs  Lab 04/16/18 2310  NA 142  K 3.6  CL 103  CO2 30  GLUCOSE 109*  BUN 14  CREATININE 0.75  CALCIUM 8.9   GFR: Estimated Creatinine Clearance: 158.7 mL/min (by C-G formula based on SCr of 0.75 mg/dL). Liver Function Tests: No results for input(s): AST, ALT, ALKPHOS, BILITOT, PROT, ALBUMIN in the last 168 hours. No results for input(s): LIPASE, AMYLASE in the last 168 hours. No results for input(s): AMMONIA in the last 168 hours. Coagulation Profile: No results for input(s): INR, PROTIME in the last 168 hours. Cardiac Enzymes: No results for input(s): CKTOTAL, CKMB, CKMBINDEX, TROPONINI in the last 168 hours. BNP (last 3 results) No results for input(s): PROBNP in the last 8760 hours. HbA1C: No results for input(s): HGBA1C in the last 72 hours. CBG: No results for input(s): GLUCAP in the last 168 hours. Lipid Profile: No results for input(s): CHOL, HDL, LDLCALC, TRIG, CHOLHDL, LDLDIRECT in the  last 72 hours. Thyroid Function Tests: No results for input(s): TSH, T4TOTAL, FREET4, T3FREE, THYROIDAB in the last 72 hours. Anemia Panel: No results for input(s): VITAMINB12, FOLATE, FERRITIN, TIBC, IRON, RETICCTPCT in the last 72 hours. Urine analysis:    Component Value Date/Time   COLORURINE YELLOW 06/16/2016 0100   APPEARANCEUR CLOUDY (A) 06/16/2016 0100  LABSPEC 1.021 06/16/2016 0100   PHURINE 6.0 06/16/2016 0100   GLUCOSEU NEGATIVE 06/16/2016 0100   HGBUR LARGE (A) 06/16/2016 0100   BILIRUBINUR NEGATIVE 06/16/2016 0100   KETONESUR NEGATIVE 06/16/2016 0100   PROTEINUR NEGATIVE 06/16/2016 0100   UROBILINOGEN 0.2 09/10/2012 0106   NITRITE NEGATIVE 06/16/2016 0100   LEUKOCYTESUR NEGATIVE 06/16/2016 0100   Sepsis Labs: No results found for this or any previous visit (from the past 240 hour(s)).   Radiological Exams on Admission: Dg Chest 2 View  Result Date: 04/16/2018 CLINICAL DATA:  52 year old female with increasing shortness of breath for 1 week. EXAM: CHEST - 2 VIEW COMPARISON:  CTA chest 01/15/2018 and earlier. FINDINGS: Semi upright AP and lateral views of the chest. Stable cardiomegaly and mediastinal contours. Chronic dependent opacity along the minor fissure most resembles atelectasis. No pneumothorax. No pleural effusion. Pulmonary vascularity appears stable over this series of exams. Crowding of lower lung markings on the lateral view. No consolidation identified. No acute osseous abnormality identified. IMPRESSION: 1. Pulmonary vascular congestion appears stable over this series of exams. Mild if any acute pulmonary edema. 2. Cardiomegaly and atelectasis. Electronically Signed   By: Genevie Ann M.D.   On: 04/16/2018 22:35   Ct Angio Chest Pe W And/or Wo Contrast  Result Date: 04/17/2018 CLINICAL DATA:  Increasing shortness of breath for 3 days. Recent admission to have fluid taken off. History of hypertension, congestive heart failure, COPD, gastroesophageal reflux  disease, pneumonia, sarcoidosis, lung biopsy. EXAM: CT ANGIOGRAPHY CHEST WITH CONTRAST TECHNIQUE: Multidetector CT imaging of the chest was performed using the standard protocol during bolus administration of intravenous contrast. Multiplanar CT image reconstructions and MIPs were obtained to evaluate the vascular anatomy. CONTRAST:  17mL ISOVUE-370 IOPAMIDOL (ISOVUE-370) INJECTION 76% COMPARISON:  01/15/2018 FINDINGS: Cardiovascular: Evaluation of pulmonary arteries is limited due to poor contrast bolus and motion artifact. There is moderately good opacification of the central pulmonary arteries and no large central filling defects are demonstrated. Diffuse cardiac enlargement. No pericardial effusions. Normal caliber thoracic aorta. Great vessel origins are patent. Mediastinum/Nodes: Esophagus is decompressed. No significant lymphadenopathy in the chest. Diffuse enlargement of the thyroid gland likely representing thyroid goiter. Similar appearance to previous study. Lungs/Pleura: Evaluation is limited due to motion artifact. There is patchy mosaic attenuation pattern to the lungs. This can indicate air trapping or edema or may be due to motion artifact. There is evidence of atelectasis or edema in the lung bases. Appearances are similar to previous study. No pleural effusions. No pneumothorax. Airways are patent. Upper Abdomen: No acute process demonstrated in the visualized upper abdomen. Musculoskeletal: Degenerative changes in the spine. No destructive bone lesions. Review of the MIP images confirms the above findings. IMPRESSION: 1. Evaluation of pulmonary arteries is limited due to motion artifact and poor contrast bolus but there is no evidence of large central pulmonary embolus. 2. Cardiac enlargement. 3. Mosaic attenuation pattern to the lungs may be due to motion artifact, air trapping, or edema. Atelectasis or edema in the lung bases. Similar to previous study. Electronically Signed   By: Lucienne Capers M.D.   On: 04/17/2018 01:20    EKG: Independently reviewed.  Sinus tachycardia 113 bpm  Assessment/Plan Suspected diastolic CHF exacerbation: Acute.  Patient presents with complaints of progressively worsening shortness of breath over the last several days.  Chest x-ray showing possible pulmonary edema.  BNP 36 on admission.  Last EF noted to be 55 to 60% by echocardiogram on 01/11/2018. - Admit to a telemetry bed -  Heart failure orders set  initiated  - Continuous pulse oximetry with nasal cannula oxygen as needed to keep O2 saturations >92% - Strict I&Os and daily weights - Elevate lower extremities - Lasix 60 mg IV Bid - Reassess in a.m. and adjust diuresis as needed. - Check echocardiogram - Optimize medical management as able - Will need consult to cardiology in a.m.   Upper respiratory infection: Patient reports still having a productive cough.  No clear signs of pneumonia noted on chest x-ray or CT imaging. - Check respiratory virus panel and sputum studies - Mucinex  Respiratory failure: Acute on chronic.  Patient baseline is on 2 L of nasal cannula oxygen 24/7.  O2 saturations never noted to be less than 91% on 4 L at this time. - Continuous pulse oximetry with nasal cannula oxygen as needed to maintain O2 saturations  History of sarcoidosis on chronic steroids: Patient does not report complaints of rash or symptoms similar to sarcoidosis flare. - Continue prednisone   Hypertension: Stable. - Continue losartan and metoprolol  Normocytic normochromic anemia:  Hemoglobin stable at 11.8 - Continue to monitor  Hypothyroidism - Check TSH - Continue levothyroxine  OSA on CPAP - Continue CPAP per respiratory therapy  Morbid obesity: BMI noted to be 78.83  DVT prophylaxis: lovenox   Code Status: Full Family Communication: None Disposition Plan: Likely discharge home in 1 to 2 days Consults called: None Admission status: Observation  Norval Morton MD [facility] Pager [phone]   If 7PM-7AM, please contact night-coverage [email or web address] Password [email or web address]  04/17/2018, 1:46 AM

## 2018-04-17 NOTE — Consult Note (Addendum)
   [facility] CM Inpatient Consult   04/17/2018  [name] Nov 24, 1965 [id number]   Referral received from inpatient [facility] for [place] Management services.  Spoke with [name] at bedside to discuss [place] Management follow up for Health Coach for CHF disease education. [name] agreeable and [facility] Care Management written consent obtained. [facility] Care Management folder provided.   Confirmed Primary Care MD is [name] ( [name] at [facility] listed as doing transition of care call).  Family lives with patient.  [name] denies having any concerns with obtaining medications or with transportation.  Will make referral for [place] due to having 2 hospitalizations in the past 6 months and because referral was received last hospitalization for [facility] and patient declined at that time.   [name] has a medical history of HTN, HLD, diastolic CHF, chronic respiratory failure on 2 L of [place], sarcoidosis on chronic steroids, OHS/OSA  on CPAP, morbid obesity, GERD, and hypothyroidism; Risk of unplanned readmission score is 11% (low).    Kim Rolling, MSN-Ed, RN,BSN [facility] Liaison [phone]

## 2018-04-17 NOTE — Progress Notes (Signed)
[facility] PROGRESS NOTE  [name] TDD:[id number] DOB: [birth year] DOA: 04/16/2018  PCP: [name], NP  Brief History/Interval Summary: 52 year old African-American female who is morbidly obese, with history of hypertension, hyperlipidemia, chronic diastolic CHF, chronic hypoxic respiratory failure on home oxygen, sarcoidosis on chronic steroids, OHS/OSA on CPAP, GERD and hypothyroidism presented with 3-day history of shortness of breath.  She had cold-like symptoms last week.  Her son was ill with similar complaints.  She had productive cough and fever.  She took some Benadryl at home.  However her symptoms never resolved.  Saturations were noted to be low at home.  She called EMS and she was brought into the hospital.  She does not report any weight gain.  No leg edema more than usual.  Evaluation in the emergency department raised concern for mild pulmonary edema on CT scan.  She was hospitalized for further management.  Reason for Visit: Acute on chronic hypoxic respiratory failure  Consultants: None yet  Procedures: Transthoracic echocardiogram is pending  Antibiotics: None  Subjective/Interval History: Continues to have shortness of breath.  She denies any chest pain.  Has a cough with clear expectoration.  Denies any nausea or vomiting.  ROS: No headaches  Objective:  Vital Signs  Vitals:   04/17/18 0300 04/17/18 0330 04/17/18 0400 04/17/18 0414  BP: (!) 167/81 (!) 172/73 (!) 162/143   Pulse: 94  85   Resp: (!) 31 (!) 30 20   Temp:   99 F (37.2 C)   TempSrc:   Oral   SpO2: 94%  96%   Weight:    (!) 219.2 kg (483 lb 4.8 oz)  Height:       No intake or output data in the 24 hours ending 04/17/18 1059 Filed Weights   04/16/18 2136 04/17/18 0414  Weight: (!) 218.2 kg (481 lb) (!) 219.2 kg (483 lb 4.8 oz)    General appearance: alert, cooperative, appears stated age, no distress and morbidly obese Head: Normocephalic, without obvious  abnormality, atraumatic Resp: Somewhat diminished air entry at the bases but no definite crackles heard.  No wheezing.  No rhonchi. Cardio: regular rate and rhythm, S1, S2 normal, no murmur, click, rub or gallop GI: soft, non-tender; bowel sounds normal; no masses,  no organomegaly Extremities: She does have edema in the lower extremity but appears to be chronic lymphedema Neurologic: No focal deficits  Lab Results:  Data Reviewed: I have personally reviewed following labs and imaging studies  CBC: Recent Labs  Lab 04/16/18 2310 04/17/18 0749  WBC 10.5 11.0*  NEUTROABS 8.7* 10.3*  HGB 11.8* 12.9  HCT 37.2 40.3  MCV 88.4 88.2  PLT 205 623    Basic Metabolic Panel: Recent Labs  Lab 04/16/18 2310  NA 142  K 3.6  CL 103  CO2 30  GLUCOSE 109*  BUN 14  CREATININE 0.75  CALCIUM 8.9    GFR: Estimated Creatinine Clearance: 159.2 mL/min (by C-G formula based on SCr of 0.75 mg/dL).  Cardiac Enzymes: Recent Labs  Lab 04/17/18 0259 04/17/18 0749  TROPONINI <0.03 <0.03    Thyroid Function Tests: Recent Labs    04/17/18 0749  TSH 2.744     Radiology Studies: Dg Chest 2 View  Result Date: 04/16/2018 CLINICAL DATA:  52 year old female with increasing shortness of breath for 1 week. EXAM: CHEST - 2 VIEW COMPARISON:  CTA chest 01/15/2018 and earlier. FINDINGS: Semi upright AP and lateral views of the chest. Stable cardiomegaly and mediastinal contours. Chronic dependent  opacity along the minor fissure most resembles atelectasis. No pneumothorax. No pleural effusion. Pulmonary vascularity appears stable over this series of exams. Crowding of lower lung markings on the lateral view. No consolidation identified. No acute osseous abnormality identified. IMPRESSION: 1. Pulmonary vascular congestion appears stable over this series of exams. Mild if any acute pulmonary edema. 2. Cardiomegaly and atelectasis. Electronically Signed   By: Genevie Ann M.D.   On: 04/16/2018 22:35   Ct  Angio Chest Pe W And/or Wo Contrast  Result Date: 04/17/2018 CLINICAL DATA:  Increasing shortness of breath for 3 days. Recent admission to have fluid taken off. History of hypertension, congestive heart failure, COPD, gastroesophageal reflux disease, pneumonia, sarcoidosis, lung biopsy. EXAM: CT ANGIOGRAPHY CHEST WITH CONTRAST TECHNIQUE: Multidetector CT imaging of the chest was performed using the standard protocol during bolus administration of intravenous contrast. Multiplanar CT image reconstructions and MIPs were obtained to evaluate the vascular anatomy. CONTRAST:  178mL ISOVUE-370 IOPAMIDOL (ISOVUE-370) INJECTION 76% COMPARISON:  01/15/2018 FINDINGS: Cardiovascular: Evaluation of pulmonary arteries is limited due to poor contrast bolus and motion artifact. There is moderately good opacification of the central pulmonary arteries and no large central filling defects are demonstrated. Diffuse cardiac enlargement. No pericardial effusions. Normal caliber thoracic aorta. Great vessel origins are patent. Mediastinum/Nodes: Esophagus is decompressed. No significant lymphadenopathy in the chest. Diffuse enlargement of the thyroid gland likely representing thyroid goiter. Similar appearance to previous study. Lungs/Pleura: Evaluation is limited due to motion artifact. There is patchy mosaic attenuation pattern to the lungs. This can indicate air trapping or edema or may be due to motion artifact. There is evidence of atelectasis or edema in the lung bases. Appearances are similar to previous study. No pleural effusions. No pneumothorax. Airways are patent. Upper Abdomen: No acute process demonstrated in the visualized upper abdomen. Musculoskeletal: Degenerative changes in the spine. No destructive bone lesions. Review of the MIP images confirms the above findings. IMPRESSION: 1. Evaluation of pulmonary arteries is limited due to motion artifact and poor contrast bolus but there is no evidence of large central  pulmonary embolus. 2. Cardiac enlargement. 3. Mosaic attenuation pattern to the lungs may be due to motion artifact, air trapping, or edema. Atelectasis or edema in the lung bases. Similar to previous study. Electronically Signed   By: Lucienne Capers M.D.   On: 04/17/2018 01:20     Medications:  Scheduled: . diclofenac sodium  4 g Topical QID  . enoxaparin (LOVENOX) injection  100 mg Subcutaneous Q24H  . famotidine  40 mg Oral BID  . folic acid  1 mg Oral Daily  . furosemide  60 mg Intravenous BID  . guaiFENesin  600 mg Oral BID  . levothyroxine  100 mcg Oral Q0600  . losartan  100 mg Oral Daily  . methylPREDNISolone (SOLU-MEDROL) injection  60 mg Intravenous Q6H  . metoprolol succinate  25 mg Oral Daily  . sodium chloride flush  3 mL Intravenous Q12H   Continuous: . sodium chloride     WUJ:WJXBJY chloride, acetaminophen, diphenhydrAMINE, HYDROcodone-acetaminophen, ipratropium-albuterol, ondansetron (ZOFRAN) IV, sodium chloride flush  Assessment/Plan:    Acute on chronic hypoxic respiratory failure Patient uses oxygen at home at 2 L/min.  Her oxygen requirements have gone up over the last 2 days.  Imaging studies suggested perhaps a mild degree of pulmonary edema.  No other etiology for increased oxygen requirement is noted.  Continue to monitor for now.  Suspected acute on chronic diastolic CHF Imaging studies did suggest pulmonary edema.  She  has been placed on diuretics which will be continued.  Her weight is recorded today was 219.2 kg.  When she was discharged in March after being diuresed her weight was 213.6 kg.  This could suggest an element of fluid overload.  Continue with IV Lasix.  Follow-up on echocardiogram.  Echocardiogram from March showed normal systolic function.  Her OSA and OHS also contributing.  Depending on the results of the echo and how she does clinically, we may need to consult cardiology.  Strict ins and outs.  Daily weights.  She is noted to be on on  beta-blocker and ARB.  History of sarcoidosis Followed by pulmonology and by rheumatology.  She is on steroids on a daily basis.  We will give her Solu-Medrol for the next 2 to 3 days in case her sarcoidosis is contributing to her symptoms although no significant lymphadenopathy noted on CT scan.  Acute upper respiratory tract infection She contracted this from her son.  Continues to have a productive cough.  No pneumonia noted on chest x-ray or CT.  Respiratory viral panel is pending.  Mucinex.  Essential hypertension Stable.  Continue home medications.  Normocytic anemia Stable.  Hypothyroidism Continue home medications.  Obstructive sleep apnea and obesity hypoventilation syndrome Stable.  CPAP.  Morbid obesity Body mass index is 79.2 kg/m.  DVT Prophylaxis: Lovenox    Code Status: Full code Family Communication: Discussed with the patient Disposition Plan: Management as outlined above.  Continue IV diuretics.  Echocardiogram.    LOS: 0 days   [facility] Pager [phone] 04/17/2018, 10:59 AM  If 7PM-7AM, please contact night-coverage at [email or web address], password [facility]

## 2018-04-17 NOTE — ED Notes (Signed)
ED TO INPATIENT HANDOFF REPORT  Name/Age/Gender [name] 52 y.o. female  Code Status    Code Status Orders  (From admission, onward)        Start     Ordered   04/17/18 0209  Full code  Continuous     04/17/18 0212    Code Status History    Date Active Date Inactive Code Status Order ID Comments User Context   01/10/2018 1616 01/17/2018 1640 Full Code [id number]  Hosie Poisson, MD Inpatient   02/12/2017 0139 02/16/2017 2116 Full Code [id number]  Reubin Milan, MD Inpatient   06/16/2016 1221 06/19/2016 1424 Full Code [id number]  Jani Gravel, MD Inpatient   02/27/2016 0452 02/28/2016 1848 Full Code [id number]  Vianne Bulls, MD ED   07/08/2015 1555 07/11/2015 1710 Full Code [id number]  Modena Jansky, MD Inpatient   01/15/2015 1709 01/18/2015 1550 Full Code [id number]  Theressa Millard, MD Inpatient   09/09/2012 1634 09/10/2012 1248 Full Code [id number]  Angus Palms, RN Inpatient      Home/SNF/Other Home  Chief Complaint Shortness of breath  Level of Care/Admitting Diagnosis ED Disposition    ED Disposition Condition [facility] Area: [facility] [100102]  Level of Care: Telemetry [5]  Admit to tele based on following criteria: Complex arrhythmia (Bradycardia/Tachycardia)  Diagnosis: CHF exacerbation [facility]) [025852]  Admitting Physician: Norval Morton [[id number]]  Attending Physician: Norval Morton [[id number]]  PT Class (Do Not Modify): Observation [104]  PT Acc Code (Do Not Modify): Observation [10022]       Medical History Past Medical History:  Diagnosis Date  . Anemia   . Arthritis    hands, shoulders, no meds  . CHF (congestive heart failure) (HCC)    EF60-65%  . Chronic hyperventilation syndrome    w/ obesity tx with albuterol inhaler and oxygen 2L  . COPD (chronic obstructive pulmonary disease) ([place])    uses oxygen 2 L  . Gallstones   . GERD (gastroesophageal reflux disease)    diet controlled - no  meds  . H/O hiatal hernia   . Hypertension   . Hypothyroidism   . Kidney stones   . Morbid obesity ([place])   . Pneumonia    hoispitalized in 08/2011  . Sarcoidosis   . Seasonal allergies   . Sleep apnea    uses CPAP machine     Allergies No Known Allergies  IV Location/Drains/Wounds Patient Lines/Drains/Airways Status   Active Line/Drains/Airways    Name:   Placement date:   Placement time:   Site:   Days:   Peripheral IV 04/16/18 Right Antecubital   04/16/18    2310    Antecubital   1   Incision (Closed) 08/09/16 Perineum Other (Comment)   08/09/16    1001     616          Labs/Imaging Results for orders placed or performed during the hospital encounter of 04/16/18 (from the past 48 hour(s))  CBC with Differential/Platelet     Status: Abnormal   Collection Time: 04/16/18 11:10 PM  Result Value Ref Range   WBC 10.5 4.0 - 10.5 K/uL   RBC 4.21 3.87 - 5.11 MIL/uL   Hemoglobin 11.8 (L) 12.0 - 15.0 g/dL   HCT 37.2 36.0 - 46.0 %   MCV 88.4 78.0 - 100.0 fL   MCH 28.0 26.0 - 34.0 pg   MCHC 31.7 30.0 - 36.0 g/dL   RDW 18.2 (H)  11.5 - 15.5 %   Platelets 205 150 - 400 K/uL    Comment: REPEATED TO VERIFY SPECIMEN CHECKED FOR CLOTS    Neutrophils Relative % 82 %   Neutro Abs 8.7 (H) 1.7 - 7.7 K/uL   Lymphocytes Relative 11 %   Lymphs Abs 1.1 0.7 - 4.0 K/uL   Monocytes Relative 5 %   Monocytes Absolute 0.5 0.1 - 1.0 K/uL   Eosinophils Relative 2 %   Eosinophils Absolute 0.2 0.0 - 0.7 K/uL   Basophils Relative 0 %   Basophils Absolute 0.0 0.0 - 0.1 K/uL   RBC Morphology MORPHOLOGY UNREMARKABLE     Comment: Performed at [facility], [place] [address] [place], [place] [address]  Basic metabolic panel     Status: Abnormal   Collection Time: 04/16/18 11:10 PM  Result Value Ref Range   Sodium 142 135 - 145 mmol/L   Potassium 3.6 3.5 - 5.1 mmol/L   Chloride 103 101 - 111 mmol/L   CO2 30 22 - 32 mmol/L   Glucose, Bld 109 (H) 65 - 99 mg/dL   BUN 14 6 - 20  mg/dL   Creatinine, Ser 0.75 0.44 - 1.00 mg/dL   Calcium 8.9 8.9 - 10.3 mg/dL   GFR calc non Af Amer >60 >60 mL/min   GFR calc Af Amer >60 >60 mL/min    Comment: (NOTE) The eGFR has been calculated using the CKD EPI equation. This calculation has not been validated in all clinical situations. eGFR's persistently <60 mL/min signify possible Chronic Kidney Disease.    Anion gap 9 5 - 15    Comment: Performed at [facility], [place] [address], [place], [place] [address]  Brain natriuretic peptide     Status: None   Collection Time: 04/16/18 11:10 PM  Result Value Ref Range   B Natriuretic Peptide 36.0 0.0 - 100.0 pg/mL    Comment: Performed at [facility], [place] [address] [place], [place] [address]  I-Stat Troponin, ED (not at [facility])     Status: None   Collection Time: 04/16/18 11:17 PM  Result Value Ref Range   Troponin i, poc 0.00 0.00 - 0.08 ng/mL   Comment 3            Comment: Due to the release kinetics of cTnI, a negative result within the first hours of the onset of symptoms does not rule out myocardial infarction with certainty. If myocardial infarction is still suspected, repeat the test at appropriate intervals.    Dg Chest 2 View  Result Date: 04/16/2018 CLINICAL DATA:  52 year old female with increasing shortness of breath for 1 week. EXAM: CHEST - 2 VIEW COMPARISON:  CTA chest 01/15/2018 and earlier. FINDINGS: Semi upright AP and lateral views of the chest. Stable cardiomegaly and mediastinal contours. Chronic dependent opacity along the minor fissure most resembles atelectasis. No pneumothorax. No pleural effusion. Pulmonary vascularity appears stable over this series of exams. Crowding of lower lung markings on the lateral view. No consolidation identified. No acute osseous abnormality identified. IMPRESSION: 1. Pulmonary vascular congestion appears stable over this series of exams. Mild if any acute pulmonary edema. 2. Cardiomegaly and  atelectasis. Electronically Signed   By: Genevie Ann M.D.   On: 04/16/2018 22:35   Ct Angio Chest Pe W And/or Wo Contrast  Result Date: 04/17/2018 CLINICAL DATA:  Increasing shortness of breath for 3 days. Recent admission to have fluid taken off. History of hypertension, congestive heart failure, COPD,  gastroesophageal reflux disease, pneumonia, sarcoidosis, lung biopsy. EXAM: CT ANGIOGRAPHY CHEST WITH CONTRAST TECHNIQUE: Multidetector CT imaging of the chest was performed using the standard protocol during bolus administration of intravenous contrast. Multiplanar CT image reconstructions and MIPs were obtained to evaluate the vascular anatomy. CONTRAST:  144m ISOVUE-370 IOPAMIDOL (ISOVUE-370) INJECTION 76% COMPARISON:  01/15/2018 FINDINGS: Cardiovascular: Evaluation of pulmonary arteries is limited due to poor contrast bolus and motion artifact. There is moderately good opacification of the central pulmonary arteries and no large central filling defects are demonstrated. Diffuse cardiac enlargement. No pericardial effusions. Normal caliber thoracic aorta. Great vessel origins are patent. Mediastinum/Nodes: Esophagus is decompressed. No significant lymphadenopathy in the chest. Diffuse enlargement of the thyroid gland likely representing thyroid goiter. Similar appearance to previous study. Lungs/Pleura: Evaluation is limited due to motion artifact. There is patchy mosaic attenuation pattern to the lungs. This can indicate air trapping or edema or may be due to motion artifact. There is evidence of atelectasis or edema in the lung bases. Appearances are similar to previous study. No pleural effusions. No pneumothorax. Airways are patent. Upper Abdomen: No acute process demonstrated in the visualized upper abdomen. Musculoskeletal: Degenerative changes in the spine. No destructive bone lesions. Review of the MIP images confirms the above findings. IMPRESSION: 1. Evaluation of pulmonary arteries is limited due to  motion artifact and poor contrast bolus but there is no evidence of large central pulmonary embolus. 2. Cardiac enlargement. 3. Mosaic attenuation pattern to the lungs may be due to motion artifact, air trapping, or edema. Atelectasis or edema in the lung bases. Similar to previous study. Electronically Signed   By: WLucienne CapersM.D.   On: 04/17/2018 01:20    Pending Labs Unresulted Labs (From admission, onward)   Start     Ordered   04/18/18 01497 Basic metabolic panel  Daily,   R     04/17/18 0212   04/17/18 0500  TSH  Tomorrow morning,   R     04/17/18 0212   04/17/18 0500  CBC with Differential/Platelet  Tomorrow morning,   R     04/17/18 0212   04/17/18 0500  CBC with Differential/Platelet  Tomorrow morning,   R     04/17/18 0212   04/17/18 0228  Culture, sputum-assessment  Once,   R     04/17/18 0227   04/17/18 0228  Gram stain  Once,   R     04/17/18 0227   04/17/18 0228  Strep pneumoniae urinary antigen  Once,   R     04/17/18 0227   04/17/18 0228  Legionella Pneumophila Serogp 1 Ur Ag  Once,   R     04/17/18 0227   04/17/18 0228  Respiratory Panel by PCR  (Respiratory virus panel)  Once,   R     04/17/18 0227   04/17/18 0211  Troponin I  Now then every 6 hours,   R     04/17/18 0212      Vitals/Pain Today's Vitals   04/17/18 0000 04/17/18 0030 04/17/18 0200 04/17/18 0230  BP: (!) 167/85 (!) 177/84 (!) 170/62 (!) 160/82  Pulse:  100 (!) 102 100  Resp: (!) 26 (!) 26 (!) 27 (!) 27  Temp:      TempSrc:      SpO2:  97% 92% 95%  Weight:      Height:        Isolation Precautions Droplet precaution  Medications Medications  furosemide (LASIX) injection 40 mg (0 mg  Intravenous Hold 04/17/18 0302)  diclofenac sodium (VOLTAREN) 1 % transdermal gel 4 g (has no administration in time range)  diphenhydrAMINE (BENADRYL) capsule 25 mg (has no administration in time range)  famotidine (PEPCID) tablet 40 mg (40 mg Oral Given 4/86/88 5207)  folic acid (FOLVITE) tablet 1 mg  (has no administration in time range)  HYDROcodone-acetaminophen (NORCO) 10-325 MG per tablet 1 tablet (has no administration in time range)  levothyroxine (SYNTHROID, LEVOTHROID) tablet 100 mcg (has no administration in time range)  losartan (COZAAR) tablet 100 mg (has no administration in time range)  metoprolol succinate (TOPROL-XL) 24 hr tablet 25 mg (has no administration in time range)  sodium chloride flush (NS) 0.9 % injection 3 mL (3 mLs Intravenous Given 04/17/18 0301)  sodium chloride flush (NS) 0.9 % injection 3 mL (has no administration in time range)  0.9 %  sodium chloride infusion (has no administration in time range)  acetaminophen (TYLENOL) tablet 650 mg (has no administration in time range)  ondansetron (ZOFRAN) injection 4 mg (has no administration in time range)  enoxaparin (LOVENOX) injection 40 mg (has no administration in time range)  furosemide (LASIX) injection 60 mg (has no administration in time range)  ipratropium-albuterol (DUONEB) 0.5-2.5 (3) MG/3ML nebulizer solution 3 mL (has no administration in time range)  guaiFENesin (MUCINEX) 12 hr tablet 600 mg (600 mg Oral Given 04/17/18 0300)  methylPREDNISolone sodium succinate (SOLU-MEDROL) 125 mg/2 mL injection 125 mg (has no administration in time range)  iopamidol (ISOVUE-370) 76 % injection 100 mL (100 mLs Intravenous Contrast Given 04/17/18 0058)    Mobility walks

## 2018-04-17 NOTE — Progress Notes (Signed)
Lovenox per Pharmacy for DVT Prophylaxis    Pharmacy has been consulted from dosing enoxaparin (lovenox) in this patient for DVT prophylaxis.  The pharmacist has reviewed pertinent labs (Hgb _11.8__; PLT_205__), patient weight (_218__kg) and renal function (CrCl_>90__mL/min) and decided that enoxaparin _100_mg SQ Q24Hrs is appropriate for this patient.  The pharmacy department will sign off at this time.  Please reconsult pharmacy if status changes or for further issues.  Thank you  Cyndia Diver PharmD, BCPS  04/17/2018, 4:03 AM

## 2018-04-18 DIAGNOSIS — J9621 Acute and chronic respiratory failure with hypoxia: Secondary | ICD-10-CM

## 2018-04-18 LAB — BASIC METABOLIC PANEL
Anion gap: 9 (ref 5–15)
BUN: 24 mg/dL — ABNORMAL HIGH (ref 6–20)
CALCIUM: 9.1 mg/dL (ref 8.9–10.3)
CO2: 29 mmol/L (ref 22–32)
CREATININE: 0.88 mg/dL (ref 0.44–1.00)
Chloride: 102 mmol/L (ref 101–111)
GFR calc Af Amer: >60 mL/min (ref >60)
GFR calc non Af Amer: >60 mL/min (ref >60)
Glucose, Bld: 212 mg/dL — ABNORMAL HIGH (ref 65–99)
Potassium: 4.4 mmol/L (ref 3.5–5.1)
SODIUM: 140 mmol/L (ref 135–145)

## 2018-04-18 LAB — RESPIRATORY PANEL BY PCR
Adenovirus: NOT DETECTED
BORDETELLA PERTUSSIS-RVPCR: NOT DETECTED
CHLAMYDOPHILA PNEUMONIAE-RVPPCR: NOT DETECTED
CORONAVIRUS HKU1-RVPPCR: NOT DETECTED
Coronavirus 229E: NOT DETECTED
Coronavirus NL63: NOT DETECTED
Coronavirus OC43: NOT DETECTED
INFLUENZA A-RVPPCR: NOT DETECTED
Influenza B: NOT DETECTED
Metapneumovirus: NOT DETECTED
Mycoplasma pneumoniae: NOT DETECTED
PARAINFLUENZA VIRUS 3-RVPPCR: NOT DETECTED
Parainfluenza Virus 1: NOT DETECTED
Parainfluenza Virus 2: NOT DETECTED
Parainfluenza Virus 4: NOT DETECTED
RHINOVIRUS / ENTEROVIRUS - RVPPCR: NOT DETECTED
Respiratory Syncytial Virus: NOT DETECTED

## 2018-04-18 MED ORDER — METHYLPREDNISOLONE SODIUM SUCC 125 MG IJ SOLR
60.0000 mg | Freq: Two times a day (BID) | INTRAMUSCULAR | Status: AC
  Administered 2018-04-18: 60 mg via INTRAVENOUS
  Filled 2018-04-18: qty 2

## 2018-04-18 MED ORDER — PREDNISONE 10 MG PO TABS
60.0000 mg | ORAL_TABLET | Freq: Every day | ORAL | Status: DC
  Administered 2018-04-19: 60 mg via ORAL
  Filled 2018-04-18: qty 1

## 2018-04-18 NOTE — Progress Notes (Signed)
[facility] PROGRESS NOTE  [name] DTO:[id number] DOB: Aug 12, 1966 DOA: 04/16/2018  PCP: [name], NP  Brief History/Interval Summary: 52 year old African-American female who is morbidly obese, with history of hypertension, hyperlipidemia, chronic diastolic CHF, chronic hypoxic respiratory failure on home oxygen, sarcoidosis on chronic steroids, OHS/OSA on CPAP, GERD and hypothyroidism presented with 3-day history of shortness of breath.  She had cold-like symptoms last week.  Her son was ill with similar complaints.  She had productive cough and fever.  She took some Benadryl at home.  However her symptoms never resolved.  Saturations were noted to be low at home.  She called EMS and she was brought into the hospital.  She does not report any weight gain.  No leg edema more than usual.  Evaluation in the emergency department raised concern for mild pulmonary edema on CT scan.  She was hospitalized for further management.  Reason for Visit: Acute on chronic hypoxic respiratory failure  Consultants: None yet  Procedures: Transthoracic echocardiogram is pending  Antibiotics: None  Subjective/Interval History: Patient states that she is feeling better.  She did pass a lot of urine yesterday.  Urine output not measured.  Breathing has improved.  Denies any chest pain.  No nausea vomiting.    ROS: Denies any headaches  Objective:  Vital Signs  Vitals:   04/17/18 1344 04/17/18 2152 04/18/18 0458 04/18/18 0500  BP: (!) 182/100 (!) 143/74 (!) 182/81   Pulse: 82 74 70   Resp: 19 16 16    Temp: 98.1 F (36.7 C) 98.1 F (36.7 C) 98 F (36.7 C)   TempSrc: Oral     SpO2: 98% 100% 96%   Weight:    (!) 216.8 kg (477 lb 14.4 oz)  Height:        Intake/Output Summary (Last 24 hours) at 04/18/2018 1127 Last data filed at 04/18/2018 1100 Gross per 24 hour  Intake 240 ml  Output 500 ml  Net -260 ml   Filed Weights   04/16/18 2136 04/17/18 0414 04/18/18 0500   Weight: (!) 218.2 kg (481 lb) (!) 219.2 kg (483 lb 4.8 oz) (!) 216.8 kg (477 lb 14.4 oz)    General appearance: Awake alert.  In no distress.  Morbidly obese. Resp: Improved air entry bilaterally.  No crackles.  No rhonchi.  Normal effort at rest. Cardio: 2 is normal regular.  No S3-S4.  No rubs murmurs or bruit GI: Abdomen is soft obese.  Nontender nondistended Extremities: Chronic lymphedema noted Neurologic: No focal deficits  Lab Results:  Data Reviewed: I have personally reviewed following labs and imaging studies  CBC: Recent Labs  Lab 04/16/18 2310 04/17/18 0749  WBC 10.5 11.0*  NEUTROABS 8.7* 10.3*  HGB 11.8* 12.9  HCT 37.2 40.3  MCV 88.4 88.2  PLT 205 983    Basic Metabolic Panel: Recent Labs  Lab 04/16/18 2310 04/18/18 0415  NA 142 140  K 3.6 4.4  CL 103 102  CO2 30 29  GLUCOSE 109* 212*  BUN 14 24*  CREATININE 0.75 0.88  CALCIUM 8.9 9.1    GFR: Estimated Creatinine Clearance: 143.6 mL/min (by C-G formula based on SCr of 0.88 mg/dL).  Cardiac Enzymes: Recent Labs  Lab 04/17/18 0259 04/17/18 0749 04/17/18 1347  TROPONINI <0.03 <0.03 <0.03    Thyroid Function Tests: Recent Labs    04/17/18 0749  TSH 2.744     Radiology Studies: Dg Chest 2 View  Result Date: 04/16/2018 CLINICAL DATA:  52 year old female with increasing  shortness of breath for 1 week. EXAM: CHEST - 2 VIEW COMPARISON:  CTA chest 01/15/2018 and earlier. FINDINGS: Semi upright AP and lateral views of the chest. Stable cardiomegaly and mediastinal contours. Chronic dependent opacity along the minor fissure most resembles atelectasis. No pneumothorax. No pleural effusion. Pulmonary vascularity appears stable over this series of exams. Crowding of lower lung markings on the lateral view. No consolidation identified. No acute osseous abnormality identified. IMPRESSION: 1. Pulmonary vascular congestion appears stable over this series of exams. Mild if any acute pulmonary edema. 2.  Cardiomegaly and atelectasis. Electronically Signed   By: Genevie Ann M.D.   On: 04/16/2018 22:35   Ct Angio Chest Pe W And/or Wo Contrast  Result Date: 04/17/2018 CLINICAL DATA:  Increasing shortness of breath for 3 days. Recent admission to have fluid taken off. History of hypertension, congestive heart failure, COPD, gastroesophageal reflux disease, pneumonia, sarcoidosis, lung biopsy. EXAM: CT ANGIOGRAPHY CHEST WITH CONTRAST TECHNIQUE: Multidetector CT imaging of the chest was performed using the standard protocol during bolus administration of intravenous contrast. Multiplanar CT image reconstructions and MIPs were obtained to evaluate the vascular anatomy. CONTRAST:  130mL ISOVUE-370 IOPAMIDOL (ISOVUE-370) INJECTION 76% COMPARISON:  01/15/2018 FINDINGS: Cardiovascular: Evaluation of pulmonary arteries is limited due to poor contrast bolus and motion artifact. There is moderately good opacification of the central pulmonary arteries and no large central filling defects are demonstrated. Diffuse cardiac enlargement. No pericardial effusions. Normal caliber thoracic aorta. Great vessel origins are patent. Mediastinum/Nodes: Esophagus is decompressed. No significant lymphadenopathy in the chest. Diffuse enlargement of the thyroid gland likely representing thyroid goiter. Similar appearance to previous study. Lungs/Pleura: Evaluation is limited due to motion artifact. There is patchy mosaic attenuation pattern to the lungs. This can indicate air trapping or edema or may be due to motion artifact. There is evidence of atelectasis or edema in the lung bases. Appearances are similar to previous study. No pleural effusions. No pneumothorax. Airways are patent. Upper Abdomen: No acute process demonstrated in the visualized upper abdomen. Musculoskeletal: Degenerative changes in the spine. No destructive bone lesions. Review of the MIP images confirms the above findings. IMPRESSION: 1. Evaluation of pulmonary arteries is  limited due to motion artifact and poor contrast bolus but there is no evidence of large central pulmonary embolus. 2. Cardiac enlargement. 3. Mosaic attenuation pattern to the lungs may be due to motion artifact, air trapping, or edema. Atelectasis or edema in the lung bases. Similar to previous study. Electronically Signed   By: Lucienne Capers M.D.   On: 04/17/2018 01:20     Medications:  Scheduled: . diclofenac sodium  4 g Topical QID  . enoxaparin (LOVENOX) injection  100 mg Subcutaneous Q24H  . famotidine  40 mg Oral BID  . folic acid  1 mg Oral Daily  . furosemide  60 mg Intravenous BID  . guaiFENesin  600 mg Oral BID  . levothyroxine  100 mcg Oral Q0600  . losartan  100 mg Oral Daily  . methylPREDNISolone (SOLU-MEDROL) injection  60 mg Intravenous Q6H  . metoprolol succinate  25 mg Oral Daily  . sodium chloride flush  3 mL Intravenous Q12H   Continuous: . sodium chloride     URK:YHCWCB chloride, acetaminophen, diphenhydrAMINE, HYDROcodone-acetaminophen, ipratropium-albuterol, ondansetron (ZOFRAN) IV, sodium chloride flush  Assessment/Plan:    Acute on chronic hypoxic respiratory failure Patient uses oxygen at home at 2 L/min.  Her oxygen requirements have gone up over the last 2 days.  Imaging studies suggested perhaps a mild  degree of pulmonary edema.  No other etiology for increased oxygen requirement is noted.  Patient was started on diuretics.  She does feel better.  Continue to wean down oxygen as tolerated.  Acute on chronic diastolic CHF Imaging studies did suggest pulmonary edema.  She has been placed on diuretics which will be continued.  Patient appears to be diuresing well.  Her weight at the time of admission was 219.2 kg. When she was discharged in March after being diuresed her weight was 213.6 kg.  Today the weight is recorded as 216.8 kg.  Continue IV Lasix for now.  Continue strict ins and outs and daily weights.  Urine output needs to be measured accurately.  Echocardiogram from March showed normal systolic function.  Another echocardiogram has been ordered to see if there is any change since patient states that she has been compliant with her diuretics.  Her OSA and OHS also contributing.  The recent acute upper respiratory tract infection could have also tripped her over.  Depending on the results of the echo and how she does clinically, we may need to consult cardiology.  If she continues to improve she can just follow-up with Dr. [name] in his office. She is noted to be on on beta-blocker and ARB.  History of sarcoidosis Followed by pulmonology and by rheumatology.  She is on steroids on a daily basis.  Since initially etiology was not entirely certain patient was given Solu-Medrol.  Since now she is feeling better with diuresis we will start cutting back on the Solu-Medrol. No significant lymphadenopathy noted on CT scan.  Acute upper respiratory tract infection She contracted this from her son.  Continues to have a productive cough.  No pneumonia noted on chest x-ray or CT.  Respiratory viral panel is pending.  Mucinex.  Essential hypertension Stable.  Continue home medications.  Normocytic anemia Stable.  Hypothyroidism Continue home medications.  Obstructive sleep apnea and obesity hypoventilation syndrome Stable.  CPAP.  Morbid obesity Body mass index is 79.2 kg/m.  DVT Prophylaxis: Lovenox    Code Status: Full code Family Communication: Discussed with the patient Disposition Plan: Continue IV diuretics.  Follow-up on echocardiogram.  Cut back on steroids.  Mobilize.    LOS: 0 days   [facility] Pager [phone] 04/18/2018, 11:27 AM  If 7PM-7AM, please contact night-coverage at [email or web address], password [facility]

## 2018-04-18 NOTE — Progress Notes (Signed)
PT Cancellation Note  Patient Details Name: [name] MRN: [id number] DOB: Nov 21, 1965   Cancelled Treatment:    Reason Eval/Treat Not Completed: PT screened, no needs identified. Pt is Mod Ind with mobility currently. Pt does not have any PT needs. Nursing can assess O2 ambulatory sats when necessary. Will sign off.    Weston Anna, MPT Pager: [phone]

## 2018-04-18 NOTE — Progress Notes (Signed)
Pt. placed on CPAP in auto mode of >16/<5 with 5 lpm oxygen added to circuit, humidifier filled with s/w, m/l nasal mask used per home regimen, tolerating well, made aware to notify if needed.

## 2018-04-18 NOTE — Care Management Obs Status (Signed)
[facility] NOTIFICATION   Patient Details  Name: MIKHALA KENAN MRN: [id number] Date of Birth: Sep 12, 1966   Medicare Observation Status Notification Given:  Yes    Purcell Mouton, RN 04/18/2018, 3:17 PM

## 2018-04-19 ENCOUNTER — Inpatient Hospital Stay (HOSPITAL_COMMUNITY): Payer: Medicare HMO

## 2018-04-19 DIAGNOSIS — E038 Other specified hypothyroidism: Secondary | ICD-10-CM

## 2018-04-19 DIAGNOSIS — D869 Sarcoidosis, unspecified: Secondary | ICD-10-CM

## 2018-04-19 DIAGNOSIS — I5033 Acute on chronic diastolic (congestive) heart failure: Secondary | ICD-10-CM

## 2018-04-19 DIAGNOSIS — E662 Morbid (severe) obesity with alveolar hypoventilation: Secondary | ICD-10-CM

## 2018-04-19 DIAGNOSIS — I10 Essential (primary) hypertension: Secondary | ICD-10-CM

## 2018-04-19 LAB — BASIC METABOLIC PANEL
Anion gap: 9 (ref 5–15)
BUN: 34 mg/dL — AB (ref 6–20)
CALCIUM: 9 mg/dL (ref 8.9–10.3)
CO2: 30 mmol/L (ref 22–32)
CREATININE: 0.9 mg/dL (ref 0.44–1.00)
Chloride: 102 mmol/L (ref 101–111)
GFR calc Af Amer: >60 mL/min (ref >60)
Glucose, Bld: 166 mg/dL — ABNORMAL HIGH (ref 65–99)
POTASSIUM: 4.6 mmol/L (ref 3.5–5.1)
Sodium: 141 mmol/L (ref 135–145)

## 2018-04-19 LAB — ECHOCARDIOGRAM COMPLETE
Height: 65.5 in
WEIGHTICAEL: 7624 [oz_av]

## 2018-04-19 MED ORDER — PERFLUTREN LIPID MICROSPHERE
INTRAVENOUS | Status: AC
  Filled 2018-04-19: qty 10

## 2018-04-19 MED ORDER — PREDNISONE 10 MG PO TABS
10.0000 mg | ORAL_TABLET | Freq: Every day | ORAL | Status: DC
  Administered 2018-04-20: 10 mg via ORAL
  Filled 2018-04-19: qty 1

## 2018-04-19 MED ORDER — PERFLUTREN LIPID MICROSPHERE
1.0000 mL | INTRAVENOUS | Status: AC | PRN
  Administered 2018-04-19: 3 mL via INTRAVENOUS
  Filled 2018-04-19: qty 10

## 2018-04-19 MED ORDER — HEPARIN SODIUM (PORCINE) 5000 UNIT/ML IJ SOLN
5000.0000 [IU] | Freq: Three times a day (TID) | INTRAMUSCULAR | Status: DC
  Administered 2018-04-19 – 2018-04-20 (×2): 5000 [IU] via SUBCUTANEOUS
  Filled 2018-04-19 (×2): qty 1

## 2018-04-19 NOTE — Progress Notes (Signed)
PROGRESS NOTE  [name] AYT:[id number] DOB: 01-01-1966 DOA: 04/16/2018 PCP: [name], NP   LOS: 1 day   Brief Narrative / Interim history: 52 year old female with medical history significant for chronic diastolic CHF, chronic hypoxic respiratory failure on 2L home oxygen, sarcoidosis on chronic prednisone, RA on methotrexate, OSA on CPAP, chronic iron deficiency anemia requiring iron transfusions, COPD, HTN, HLD, hypothyroidism, GERD, and morbid obesity. Presented to the ED with 3 day history of worsening SOB and low O2 sats (70%) at home following a recent upper respiratory infection. CXR showed pulmonary vascular congestion, possible mild acute pulmonary edema, cardiomegaly, and atelectasis, CT Chest showed cardiac enlargement and possible pulmonary edema, with no evidence of PE, BNP 36, troponin negative. She was put on 5L O2, treated with two doses of IV lasix 40mg , IV Solu-medrol 125mg , and Mucinex and admitted to the hospital for CHF exacerbation.  Assessment & Plan: Principal Problem:   CHF exacerbation ([place]) Active Problems:   OSA (obstructive sleep apnea)   Obesity hypoventilation syndrome ([facility])   Hypertension   Sarcoidosis   Hypothyroidism  Acute on chronic diastolic CHF -CXR and CT showed evidence of pulmonary vascular congestion and possible acute pulmonary edema. -Patient put out ~2L yesterday, weight is down to 216.1kg from 219kg on admission -Continue IV lasix 60 mg BID, home dose Losartan 100mg  and Toprolol-XL 25 mg -Strict I/O's -Daily weights  Acute on chronic hypoxic respiratory failure -Likely due to pulmonary edema -Patient is on 2L O2 at home but required 5L O2 on admission to get her O2 sats into the 90s -Weaned down to 3.5L last night, patient is tolerating this well, O2 sats 100 on 3.5L last night -Continue to wean down O2 as tolerated  Acute URI -Patient had symptoms of URI for about 1 week, states her son was recently sick with similar  symptoms. -She is afebrile, no leukocytosis, non-toxic appearing, cough is improving -Respiratory panel shows no detectable viruses -Continue Mucinex    HTN -Stable, 134/69 this AM -Continue home dose Losartan 100mg  and Toprolol-XL 25 mg  Sarcoidosis -Followed by Dr. Elsworth Soho with [place], on chronic low dose prednisone  -Continue prednisone 60 mg  RA -Followed by Dr. [name] with Rheumatology -On Methotrexate, next dose due Friday -Will hold weekly dose while admitted, patient can resume at home  Hypothyroidism -Continue home dose levothyroxine 100 mcg  Iron deficiency anemia -Stable, Hgb 12.9, Iron levels not measured -Followed by Dr. Burr Medico  OSA -On CPAP  DVT prophylaxis: Lovenox Code Status: Full code Family Communication: No family members present at bedside Disposition Plan: D/C in 1-2 days if patient remains stable, continues to diurese, and O2 requirements decrease   Subjective: Patient out of bed and sitting at the window bench on arrival. She reports that her breathing has improved, however she is not yet back to baseline. She still has some SOB, especially when up and moving around but it is much better than yesterday. She still has a mild nonproductive cough and some tightness in her chest with breathing. She denies fever, chills, chest pain, nausea.   Objective: Vitals:   04/18/18 1744 04/18/18 2136 04/19/18 0540 04/19/18 0547  BP:  135/65 134/69   Pulse: 67 66 60   Resp:  20 (!) 24   Temp:  97.9 F (36.6 C) 97.6 F (36.4 C)   TempSrc:  Oral Oral   SpO2: 94% 100% 100%   Weight:    (!) 216.1 kg (476 lb 8 oz)  Height:  Intake/Output Summary (Last 24 hours) at 04/19/2018 1141 Last data filed at 04/19/2018 6720 Gross per 24 hour  Intake 720 ml  Output 2450 ml  Net -1730 ml   Filed Weights   04/17/18 0414 04/18/18 0500 04/19/18 0547  Weight: (!) 219.2 kg (483 lb 4.8 oz) (!) 216.8 kg (477 lb 14.4 oz) (!) 216.1 kg (476 lb 8 oz)     Examination:  Constitutional: Morbidly obese female, NAD Eyes: PERRL, lids and conjunctivae normal Neck: Supple Respiratory: clear to auscultation bilaterally, no wheezing, no crackles. Normal respiratory effort. No accessory muscle use. On 3.5L O2 nasal canula. Cardiovascular: Regular rate and rhythm, no appreciable murmurs / rubs / gallops. No carotid bruits.  Abdomen: Obese abdomen, soft, no tenderness.   Skin: no rashes, lesions, ulcers. No induration Neurologic: Grossly nonfocal, alert and oriented x3 Psychiatric: Normal judgment and insight. Normal mood.    Data Reviewed: I have independently reviewed following labs and imaging studies  CBC: Recent Labs  Lab 04/16/18 2310 04/17/18 0749  WBC 10.5 11.0*  NEUTROABS 8.7* 10.3*  HGB 11.8* 12.9  HCT 37.2 40.3  MCV 88.4 88.2  PLT 205 947   Basic Metabolic Panel: Recent Labs  Lab 04/16/18 2310 04/18/18 0415 04/19/18 0418  NA 142 140 141  K 3.6 4.4 4.6  CL 103 102 102  CO2 30 29 30   GLUCOSE 109* 212* 166*  BUN 14 24* 34*  CREATININE 0.75 0.88 0.90  CALCIUM 8.9 9.1 9.0   GFR: Estimated Creatinine Clearance: 140.1 mL/min (by C-G formula based on SCr of 0.9 mg/dL). Liver Function Tests: No results for input(s): AST, ALT, ALKPHOS, BILITOT, PROT, ALBUMIN in the last 168 hours. No results for input(s): LIPASE, AMYLASE in the last 168 hours. No results for input(s): AMMONIA in the last 168 hours. Coagulation Profile: No results for input(s): INR, PROTIME in the last 168 hours. Cardiac Enzymes: Recent Labs  Lab 04/17/18 0259 04/17/18 0749 04/17/18 1347  TROPONINI <0.03 <0.03 <0.03   BNP (last 3 results) No results for input(s): PROBNP in the last 8760 hours. HbA1C: No results for input(s): HGBA1C in the last 72 hours. CBG: No results for input(s): GLUCAP in the last 168 hours. Lipid Profile: No results for input(s): CHOL, HDL, LDLCALC, TRIG, CHOLHDL, LDLDIRECT in the last 72 hours. Thyroid Function  Tests: Recent Labs    04/17/18 0749  TSH 2.744   Anemia Panel: No results for input(s): VITAMINB12, FOLATE, FERRITIN, TIBC, IRON, RETICCTPCT in the last 72 hours. Urine analysis:    Component Value Date/Time   COLORURINE YELLOW 06/16/2016 0100   APPEARANCEUR CLOUDY (A) 06/16/2016 0100   LABSPEC 1.021 06/16/2016 0100   PHURINE 6.0 06/16/2016 0100   GLUCOSEU NEGATIVE 06/16/2016 0100   HGBUR LARGE (A) 06/16/2016 0100   BILIRUBINUR NEGATIVE 06/16/2016 0100   KETONESUR NEGATIVE 06/16/2016 0100   PROTEINUR NEGATIVE 06/16/2016 0100   UROBILINOGEN 0.2 09/10/2012 0106   NITRITE NEGATIVE 06/16/2016 0100   LEUKOCYTESUR NEGATIVE 06/16/2016 0100   Sepsis Labs: Invalid input(s): PROCALCITONIN, LACTICIDVEN  Recent Results (from the past 240 hour(s))  Respiratory Panel by PCR     Status: None   Collection Time: 04/18/18 11:08 AM  Result Value Ref Range Status   Adenovirus NOT DETECTED NOT DETECTED Final   Coronavirus 229E NOT DETECTED NOT DETECTED Final   Coronavirus HKU1 NOT DETECTED NOT DETECTED Final   Coronavirus NL63 NOT DETECTED NOT DETECTED Final   Coronavirus OC43 NOT DETECTED NOT DETECTED Final   Metapneumovirus NOT DETECTED NOT DETECTED  Final   Rhinovirus / Enterovirus NOT DETECTED NOT DETECTED Final   Influenza A NOT DETECTED NOT DETECTED Final   Influenza B NOT DETECTED NOT DETECTED Final   Parainfluenza Virus 1 NOT DETECTED NOT DETECTED Final   Parainfluenza Virus 2 NOT DETECTED NOT DETECTED Final   Parainfluenza Virus 3 NOT DETECTED NOT DETECTED Final   Parainfluenza Virus 4 NOT DETECTED NOT DETECTED Final   Respiratory Syncytial Virus NOT DETECTED NOT DETECTED Final   Bordetella pertussis NOT DETECTED NOT DETECTED Final   Chlamydophila pneumoniae NOT DETECTED NOT DETECTED Final   Mycoplasma pneumoniae NOT DETECTED NOT DETECTED Final    Comment: Performed at [facility], [place] [address] [place], [place] [address]      Radiology Studies: No results  found.   Scheduled Meds: . diclofenac sodium  4 g Topical QID  . enoxaparin (LOVENOX) injection  100 mg Subcutaneous Q24H  . famotidine  40 mg Oral BID  . folic acid  1 mg Oral Daily  . furosemide  60 mg Intravenous BID  . guaiFENesin  600 mg Oral BID  . levothyroxine  100 mcg Oral Q0600  . losartan  100 mg Oral Daily  . metoprolol succinate  25 mg Oral Daily  . predniSONE  60 mg Oral Q breakfast  . sodium chloride flush  3 mL Intravenous Q12H   Continuous Infusions: . sodium chloride     Collier Salina, PA-S [facility] Pager [phone]  If 7PM-7AM, please contact night-coverage [email or web address] Password [facility] 04/19/2018, 11:41 AM

## 2018-04-19 NOTE — Progress Notes (Signed)
  Echocardiogram 2D Echocardiogram has been performed.  [name] G [name] 04/19/2018, 10:03 AM

## 2018-04-19 NOTE — Progress Notes (Signed)
PROGRESS NOTE    [name]  IWL:[id number] DOB: 1966/01/30 DOA: 04/16/2018 PCP: [name], NP    Brief Narrative:  52 year old female who presented with dyspnea.  She does have the significant past medical history of hypertension, dyslipidemia, diastolic heart failure, sarcoidosis with chronic hypoxic respiratory failure ( 2 LPM), morbid obesity and hypothyroidism.  She reported 3 days of worsening dyspnea, worse with exertion and unable to sleep, her oxygen saturation reached 70% on supplemental oxygen.  Her symptoms were refractive to increase oxygen flow, and were associated with wheezing, cough, headache and generalized malaise.  On her initial physical examination blood pressure 190/108, heart rate 101, respiratory 32, oxygen saturation 94% on supplemental oxygen ( 4 LPM).  She was tachypnea, poor air movement, no rhonchi or wheezing, heart S1-S2 present distant, no gallops, abdomen protuberant, trace lower extremity edema.  Sodium 142, potassium 3.6, chloride 103, bicarb 30, glucose 1 9, BUN 14, creatinine 0.75, troponin less than 0.03, white count 10.5, hemoglobin 11.8 hematocrit 37.2, platelets 205, respiratory  panel was negative.  Chest x-ray with vascular congestion and cardiomegaly.  CT chest angiography negative for large central pulmonary embolism, positive cardiomegaly, bilateral groundglass opacities.  EKG with normal sinus rhythm, normal axis, normal intervals.  Patient was admitted to the hospital with working diagnosis of acute hypoxic respirator failure due to cardiogenic pulmonary edema due to acute on chronic diastolic heart failure exacerbation.   Assessment & Plan:   Principal Problem:   CHF exacerbation ([place]) Active Problems:   OSA (obstructive sleep apnea)   Obesity hypoventilation syndrome ([place])   Hypertension   Sarcoidosis   Hypothyroidism   1. Acute hypoxic respiratory failure due to cardiogenic pulmonary edema, due to acute diastolic heart  failure decompensation. Patient clinically improving, will continue diuresis with IV furosemide, urine output over last 24 hours, 2,950 ml. Continue heart failure management with losartan and metoprolol. Follow on repeat echocardiogram today,   2. Acute on chronic hypoxic respiratory failure in the setting of chronic sarcoidosis. Will continue oxymetry monitoring, no clinical signs of pulmonary infection, unlikely sarcoid flare, will continue supportive medical therapy, hold on antibiotic therapy or further steroids. Continue home dose of steroids. Continue duoneb as needed  3. HTN. Continue blood pressure control with losartan and metoprolol, systolic blood pressure 174 mmHg.   4. Hypothyroid. Continue levothyroxine  5. Morbid obesity. Likely worsening dyspnea due to restrictive physiology, will need aggressive weight reduction and follow up as outpatient.    DVT prophylaxis: enoxaparin   Code Status:  full Family Communication: no family at the bedside Disposition Plan: home when clinically euvolemic   Consultants:     Procedures:     Antimicrobials:       Subjective: Patient is feeling better, dyspnea is improving but not back to baseline, no chest pain, no nausea or vomiting. Out of bed.   Objective: Vitals:   04/18/18 2136 04/19/18 0540 04/19/18 0547 04/19/18 1336  BP: 135/65 134/69  135/63  Pulse: 66 60  72  Resp: 20 (!) 24  16  Temp: 97.9 F (36.6 C) 97.6 F (36.4 C)    TempSrc: Oral Oral    SpO2: 100% 100%  100%  Weight:   (!) 216.1 kg (476 lb 8 oz)   Height:        Intake/Output Summary (Last 24 hours) at 04/19/2018 1539 Last data filed at 04/19/2018 0605 Gross per 24 hour  Intake 240 ml  Output 1750 ml  Net -1510 ml   [employer]  04/17/18 0414 04/18/18 0500 04/19/18 0547  Weight: (!) 219.2 kg (483 lb 4.8 oz) (!) 216.8 kg (477 lb 14.4 oz) (!) 216.1 kg (476 lb 8 oz)    Examination:   General: Not in pain or dyspnea. Deconditioned  Neurology:  Awake and alert, non focal  E ENT: no pallor, no icterus, oral mucosa moist Cardiovascular: No JVD. S1-S2 present, rhythmic, no gallops, rubs, or murmurs. No lower extremity edema. Pulmonary: decreased breath sounds bilaterally, poor air movement, no wheezing, rhonchi or rales. Gastrointestinal. Abdomen protuberant no organomegaly, non tender, no rebound or guarding Skin. No rashes Musculoskeletal: no joint deformities     Data Reviewed: I have personally reviewed following labs and imaging studies  CBC: Recent Labs  Lab 04/16/18 2310 04/17/18 0749  WBC 10.5 11.0*  NEUTROABS 8.7* 10.3*  HGB 11.8* 12.9  HCT 37.2 40.3  MCV 88.4 88.2  PLT 205 342   Basic Metabolic Panel: Recent Labs  Lab 04/16/18 2310 04/18/18 0415 04/19/18 0418  NA 142 140 141  K 3.6 4.4 4.6  CL 103 102 102  CO2 30 29 30   GLUCOSE 109* 212* 166*  BUN 14 24* 34*  CREATININE 0.75 0.88 0.90  CALCIUM 8.9 9.1 9.0   GFR: Estimated Creatinine Clearance: 140.1 mL/min (by C-G formula based on SCr of 0.9 mg/dL). Liver Function Tests: No results for input(s): AST, ALT, ALKPHOS, BILITOT, PROT, ALBUMIN in the last 168 hours. No results for input(s): LIPASE, AMYLASE in the last 168 hours. No results for input(s): AMMONIA in the last 168 hours. Coagulation Profile: No results for input(s): INR, PROTIME in the last 168 hours. Cardiac Enzymes: Recent Labs  Lab 04/17/18 0259 04/17/18 0749 04/17/18 1347  TROPONINI <0.03 <0.03 <0.03   BNP (last 3 results) No results for input(s): PROBNP in the last 8760 hours. HbA1C: No results for input(s): HGBA1C in the last 72 hours. CBG: No results for input(s): GLUCAP in the last 168 hours. Lipid Profile: No results for input(s): CHOL, HDL, LDLCALC, TRIG, CHOLHDL, LDLDIRECT in the last 72 hours. Thyroid Function Tests: Recent Labs    04/17/18 0749  TSH 2.744   Anemia Panel: No results for input(s): VITAMINB12, FOLATE, FERRITIN, TIBC, IRON, RETICCTPCT in the last  72 hours.    Radiology Studies: I have reviewed all of the imaging during this hospital visit personally     Scheduled Meds: . diclofenac sodium  4 g Topical QID  . enoxaparin (LOVENOX) injection  100 mg Subcutaneous Q24H  . famotidine  40 mg Oral BID  . folic acid  1 mg Oral Daily  . furosemide  60 mg Intravenous BID  . guaiFENesin  600 mg Oral BID  . levothyroxine  100 mcg Oral Q0600  . losartan  100 mg Oral Daily  . metoprolol succinate  25 mg Oral Daily  . predniSONE  60 mg Oral Q breakfast  . sodium chloride flush  3 mL Intravenous Q12H   Continuous Infusions: . sodium chloride       LOS: 1 day        Dione Petron Gerome Apley, MD [facility] Pager [phone]

## 2018-04-19 NOTE — Progress Notes (Signed)
Pt. ready to be placed on CPAP auto for h/s, RN aware and pt. aware to notify if needed,

## 2018-04-20 ENCOUNTER — Encounter: Payer: Self-pay | Admitting: *Deleted

## 2018-04-20 DIAGNOSIS — E038 Other specified hypothyroidism: Secondary | ICD-10-CM | POA: Diagnosis not present

## 2018-04-20 DIAGNOSIS — E662 Morbid (severe) obesity with alveolar hypoventilation: Secondary | ICD-10-CM | POA: Diagnosis not present

## 2018-04-20 DIAGNOSIS — D869 Sarcoidosis, unspecified: Secondary | ICD-10-CM | POA: Diagnosis not present

## 2018-04-20 DIAGNOSIS — I5033 Acute on chronic diastolic (congestive) heart failure: Secondary | ICD-10-CM | POA: Diagnosis not present

## 2018-04-20 DIAGNOSIS — I10 Essential (primary) hypertension: Secondary | ICD-10-CM | POA: Diagnosis not present

## 2018-04-20 LAB — BASIC METABOLIC PANEL
ANION GAP: 9 (ref 5–15)
BUN: 40 mg/dL — AB (ref 6–20)
CHLORIDE: 101 mmol/L (ref 101–111)
CO2: 33 mmol/L — ABNORMAL HIGH (ref 22–32)
Calcium: 8.8 mg/dL — ABNORMAL LOW (ref 8.9–10.3)
Creatinine, Ser: 0.98 mg/dL (ref 0.44–1.00)
GFR calc Af Amer: >60 mL/min (ref >60)
Glucose, Bld: 131 mg/dL — ABNORMAL HIGH (ref 65–99)
POTASSIUM: 4.5 mmol/L (ref 3.5–5.1)
SODIUM: 143 mmol/L (ref 135–145)

## 2018-04-20 NOTE — Discharge Summary (Signed)
Physician Discharge Summary  [name] FGH:[id number] DOB: 11/23/1965 DOA: 04/16/2018  PCP: [name], NP  Admit date: 04/16/2018 Discharge date: 04/20/2018  Admitted From: Home  Disposition:  Home   Recommendations for Outpatient Follow-up and new medication changes:  1. Follow up with [name] PA in one week.  2.   Home Health: home   Equipment/Devices: none    Discharge Condition: Stable  CODE STATUS: full  Diet recommendation: Heart healthy, salt restricted (2 g per day), and diabetic prudent.   Brief/Interim Summary: 52 year old female who presented with dyspnea.  She does have the significant past medical history of hypertension, dyslipidemia, diastolic heart failure, sarcoidosis with chronic hypoxic respiratory failure ( 2 LPM), morbid obesity and hypothyroidism.  She reported 3 days of worsening dyspnea, worse with exertion and unable to sleep, her oxygen saturation dropped to 70% on supplemental oxygen.  Her symptoms were refractive to increase oxygen flow, and were associated with wheezing, cough, headache and generalized malaise.  On her initial physical examination blood pressure 190/108, heart rate 101, respiratory rate 32, oxygen saturation 94% on supplemental oxygen ( 4 LPM).  She had tachypnea, lungs with poor air movement, no rhonchi or wheezing, heart S1-S2 present distant, no gallops, abdomen protuberant, positive non pitting lower extremity edema.  Sodium 142, potassium 3.6, chloride 103, bicarb 30, glucose 109, BUN 14, creatinine 0.75, troponin less than 0.03, white count 10.5, hemoglobin 11.8 hematocrit 37.2, platelets 205, respiratory  panel was negative.  Chest x-ray with vascular congestion and cardiomegaly.  CT chest angiography negative for large central pulmonary embolism, positive cardiomegaly, bilateral groundglass opacities.  EKG with normal sinus rhythm, normal axis, normal intervals.  Patient was admitted to the hospital with working  diagnosis of acute hypoxic respirator failure due to cardiogenic pulmonary edema due to acute on chronic diastolic heart failure exacerbation.  1.  Acute on chronic hypoxic respiratory failure due to acute cardiogenic pulmonary edema due to acute on chronic diastolic heart failure decompensation.  Patient was admitted to the medical ward, she was placed on a remote telemetry monitor, received aggressive diuresis with IV furosemide with significant improvement in her symptoms.  Negative fluid balance was achieved of 5,215 mL since admission.  Patient will resume her home dose of furosemide 60 mg twice daily, close monitoring of fluid and sodium intake.  Continue heart failure management with beta-blockade (metoprolol) and ARB (losartan).  Further work-up with echocardiography, showed normal LV systolic function, 60 to 67% ejection fraction, mild dilated right ventricle, peak PA pressure 36.   2.  Acute on chronic hypoxic restaurant failure in the setting of chronic sarcoidosis.  No clinical signs of sarcoid flare, patient will continue home dose of prednisone, 10 mg daily, continue bronchodilator therapy with DuoNeb, her discharge oximetry 100% on 3.5 L.  3.  Hypertension.  Continue blood pressure control with losartan and metoprolol.   4.  Morbid obesity.  BMI 78.8, with aggressive weight reduction and outpatient follow-up.   5.  Rheumatoid arthritis.  Resume methotrexate at home.  6.  Hypothyroidism.  Continue levothyroxine.  Discharge Diagnoses:  Principal Problem:   CHF exacerbation ([place]) Active Problems:   OSA (obstructive sleep apnea)   Obesity hypoventilation syndrome ([place])   Hypertension   Sarcoidosis   Hypothyroidism    Discharge Instructions  Discharge Instructions    AMB Referral to [facility]   Complete by:  As directed    Please assign to [facility] for CHF education and teaching. Referral received from  inpatient RNCM. PCP office Velora Heckler at [facility]) listed  as doing toc. Written consent obtained.Currently at [facility]. Please call with questions. Marthenia Rolling, [place], [facility] [phone]   Reason for consult:  Please assign to [facility]   Diagnoses of:  Heart Failure   Expected date of contact:  1-3 days (reserved for hospital discharges)     Allergies as of 04/20/2018   No Known Allergies     Medication List    TAKE these medications   albuterol 108 (90 Base) MCG/ACT inhaler Commonly known as:  PROAIR HFA Inhale 1-2 puffs into the lungs every 6 hours as needed for wheezing.   cetirizine 10 MG tablet Commonly known as:  ZYRTEC Take 10 mg by mouth at bedtime as needed for allergies.   cholecalciferol 400 units Tabs tablet Commonly known as:  VITAMIN D Take 400 Units by mouth daily.   Coral Calcium 1000 (390 Ca) MG Tabs Take 1,000 mg by mouth daily.   Vitamin B-12 5000 MCG Tbdp Take 1 tablet by mouth once a week.   cyanocobalamin 1000 MCG tablet Take 1 tablet (1,000 mcg total) by mouth daily.   diclofenac sodium 1 % Gel Commonly known as:  VOLTAREN APPLY 1 APPLICATION 4 TIMES A DAY AS NEEDED PAIN   diphenhydrAMINE 25 mg capsule Commonly known as:  BENADRYL Take 25 mg by mouth every 6 (six) hours as needed (fever).   famotidine 40 MG tablet Commonly known as:  PEPCID Take 1 tablet (40 mg total) by mouth 2 (two) times daily.   folic acid 1 MG tablet Commonly known as:  FOLVITE Take 1 mg by mouth daily.   furosemide 20 MG tablet Commonly known as:  LASIX Take 3 tablets (60 mg total) by mouth 2 (two) times daily.   HYDROcodone-acetaminophen 10-325 MG tablet Commonly known as:  NORCO Take 1 tablet by mouth every 6 (six) hours as needed (pain).   KENALOG 40 MG/ML injection Generic drug:  triamcinolone acetonide Inject 40 mg into the muscle every 3 (three) months. For knee pain   levothyroxine 100 MCG tablet Commonly known as:  SYNTHROID, LEVOTHROID TAKE 1 TABLET (100  MCG TOTAL) BY MOUTH DAILY AT 6 (SIX) AM.   losartan 100 MG tablet Commonly known as:  COZAAR TAKE 1 TABLET (100 MG TOTAL) DAILY BY MOUTH.   methotrexate 250 MG/10ML injection INJECT 0.8CC ONCE A WEEK INJECTION 30 DAYS   metoprolol succinate 25 MG 24 hr tablet Commonly known as:  TOPROL-XL TAKE 1 TABLET BY MOUTH EVERY DAY   NON FORMULARY 2 liter of oxygen   predniSONE 10 MG tablet Commonly known as:  DELTASONE Take 1 tablet (10 mg total) by mouth daily.   triamcinolone ointment 0.1 % Commonly known as:  KENALOG Apply 1 application topically See admin instructions. Applies twice a day as needed for break out of Sarcoidosis       No Known Allergies  Consultations:     Procedures/Studies: Dg Chest 2 View  Result Date: 04/16/2018 CLINICAL DATA:  52 year old female with increasing shortness of breath for 1 week. EXAM: CHEST - 2 VIEW COMPARISON:  CTA chest 01/15/2018 and earlier. FINDINGS: Semi upright AP and lateral views of the chest. Stable cardiomegaly and mediastinal contours. Chronic dependent opacity along the minor fissure most resembles atelectasis. No pneumothorax. No pleural effusion. Pulmonary vascularity appears stable over this series of exams. Crowding of lower lung markings on the lateral view. No consolidation identified. No acute osseous abnormality  identified. IMPRESSION: 1. Pulmonary vascular congestion appears stable over this series of exams. Mild if any acute pulmonary edema. 2. Cardiomegaly and atelectasis. Electronically Signed   By: Genevie Ann M.D.   On: 04/16/2018 22:35   Ct Angio Chest Pe W And/or Wo Contrast  Result Date: 04/17/2018 CLINICAL DATA:  Increasing shortness of breath for 3 days. Recent admission to have fluid taken off. History of hypertension, congestive heart failure, COPD, gastroesophageal reflux disease, pneumonia, sarcoidosis, lung biopsy. EXAM: CT ANGIOGRAPHY CHEST WITH CONTRAST TECHNIQUE: Multidetector CT imaging of the chest was  performed using the standard protocol during bolus administration of intravenous contrast. Multiplanar CT image reconstructions and MIPs were obtained to evaluate the vascular anatomy. CONTRAST:  123mL ISOVUE-370 IOPAMIDOL (ISOVUE-370) INJECTION 76% COMPARISON:  01/15/2018 FINDINGS: Cardiovascular: Evaluation of pulmonary arteries is limited due to poor contrast bolus and motion artifact. There is moderately good opacification of the central pulmonary arteries and no large central filling defects are demonstrated. Diffuse cardiac enlargement. No pericardial effusions. Normal caliber thoracic aorta. Great vessel origins are patent. Mediastinum/Nodes: Esophagus is decompressed. No significant lymphadenopathy in the chest. Diffuse enlargement of the thyroid gland likely representing thyroid goiter. Similar appearance to previous study. Lungs/Pleura: Evaluation is limited due to motion artifact. There is patchy mosaic attenuation pattern to the lungs. This can indicate air trapping or edema or may be due to motion artifact. There is evidence of atelectasis or edema in the lung bases. Appearances are similar to previous study. No pleural effusions. No pneumothorax. Airways are patent. Upper Abdomen: No acute process demonstrated in the visualized upper abdomen. Musculoskeletal: Degenerative changes in the spine. No destructive bone lesions. Review of the MIP images confirms the above findings. IMPRESSION: 1. Evaluation of pulmonary arteries is limited due to motion artifact and poor contrast bolus but there is no evidence of large central pulmonary embolus. 2. Cardiac enlargement. 3. Mosaic attenuation pattern to the lungs may be due to motion artifact, air trapping, or edema. Atelectasis or edema in the lung bases. Similar to previous study. Electronically Signed   By: Lucienne Capers M.D.   On: 04/17/2018 01:20       Subjective: Patient is feeling better, dyspnea is close to baseline, no nausea or vomiting, no  chest pain.   Discharge Exam: Vitals:   04/19/18 2138 04/20/18 0556  BP: (!) 147/72 (!) 148/81  Pulse: 60 67  Resp: 16 12  Temp: 97.7 F (36.5 C) 97.8 F (36.6 C)  SpO2: 100% 100%   Vitals:   04/19/18 0547 04/19/18 1336 04/19/18 2138 04/20/18 0556  BP:  135/63 (!) 147/72 (!) 148/81  Pulse:  72 60 67  Resp:  16 16 12   Temp:   97.7 F (36.5 C) 97.8 F (36.6 C)  TempSrc:   Oral Oral  SpO2:  100% 100% 100%  Weight: (!) 216.1 kg (476 lb 8 oz)   (!) 216 kg (476 lb 3.2 oz)  Height:        General: Not in pain or dyspnea.  Neurology: Awake and alert, non focal  E ENT: no pallor, no icterus, oral mucosa moist Cardiovascular: No JVD. S1-S2 present, rhythmic, no gallops, rubs, or murmurs. Non pitting  lower extremity edema +++. (likley body habitus) Pulmonary: positive breath sounds bilaterally, adequate air movement, no wheezing, rhonchi or rales. Gastrointestinal. Abdomen protuberant with no organomegaly, non tender, no rebound or guarding Skin. No rashes Musculoskeletal: no joint deformities   The results of significant diagnostics from this hospitalization (including imaging, microbiology, ancillary  and laboratory) are listed below for reference.     Microbiology: Recent Results (from the past 240 hour(s))  Respiratory Panel by PCR     Status: None   Collection Time: 04/18/18 11:08 AM  Result Value Ref Range Status   Adenovirus NOT DETECTED NOT DETECTED Final   Coronavirus 229E NOT DETECTED NOT DETECTED Final   Coronavirus HKU1 NOT DETECTED NOT DETECTED Final   Coronavirus NL63 NOT DETECTED NOT DETECTED Final   Coronavirus OC43 NOT DETECTED NOT DETECTED Final   Metapneumovirus NOT DETECTED NOT DETECTED Final   Rhinovirus / Enterovirus NOT DETECTED NOT DETECTED Final   Influenza A NOT DETECTED NOT DETECTED Final   Influenza B NOT DETECTED NOT DETECTED Final   Parainfluenza Virus 1 NOT DETECTED NOT DETECTED Final   Parainfluenza Virus 2 NOT DETECTED NOT DETECTED Final    Parainfluenza Virus 3 NOT DETECTED NOT DETECTED Final   Parainfluenza Virus 4 NOT DETECTED NOT DETECTED Final   Respiratory Syncytial Virus NOT DETECTED NOT DETECTED Final   Bordetella pertussis NOT DETECTED NOT DETECTED Final   Chlamydophila pneumoniae NOT DETECTED NOT DETECTED Final   Mycoplasma pneumoniae NOT DETECTED NOT DETECTED Final    Comment: Performed at [facility], [place]. [address], [place], [place] [address]     Labs: BNP (last 3 results) Recent Labs    01/14/18 0511 04/16/18 2310  BNP 11.1 81.1   Basic Metabolic Panel: Recent Labs  Lab 04/16/18 2310 04/18/18 0415 04/19/18 0418 04/20/18 0414  NA 142 140 141 143  K 3.6 4.4 4.6 4.5  CL 103 102 102 101  CO2 30 29 30  33*  GLUCOSE 109* 212* 166* 131*  BUN 14 24* 34* 40*  CREATININE 0.75 0.88 0.90 0.98  CALCIUM 8.9 9.1 9.0 8.8*   Liver Function Tests: No results for input(s): AST, ALT, ALKPHOS, BILITOT, PROT, ALBUMIN in the last 168 hours. No results for input(s): LIPASE, AMYLASE in the last 168 hours. No results for input(s): AMMONIA in the last 168 hours. CBC: Recent Labs  Lab 04/16/18 2310 04/17/18 0749  WBC 10.5 11.0*  NEUTROABS 8.7* 10.3*  HGB 11.8* 12.9  HCT 37.2 40.3  MCV 88.4 88.2  PLT 205 208   Cardiac Enzymes: Recent Labs  Lab 04/17/18 0259 04/17/18 0749 04/17/18 1347  TROPONINI <0.03 <0.03 <0.03   BNP: Invalid input(s): POCBNP CBG: No results for input(s): GLUCAP in the last 168 hours. D-Dimer No results for input(s): DDIMER in the last 72 hours. Hgb A1c No results for input(s): HGBA1C in the last 72 hours. Lipid Profile No results for input(s): CHOL, HDL, LDLCALC, TRIG, CHOLHDL, LDLDIRECT in the last 72 hours. Thyroid function studies No results for input(s): TSH, T4TOTAL, T3FREE, THYROIDAB in the last 72 hours.  Invalid input(s): FREET3 Anemia work up No results for input(s): VITAMINB12, FOLATE, FERRITIN, TIBC, IRON, RETICCTPCT in the last 72 hours. Urinalysis     Component Value Date/Time   COLORURINE YELLOW 06/16/2016 0100   APPEARANCEUR CLOUDY (A) 06/16/2016 0100   LABSPEC 1.021 06/16/2016 0100   PHURINE 6.0 06/16/2016 0100   GLUCOSEU NEGATIVE 06/16/2016 0100   HGBUR LARGE (A) 06/16/2016 0100   BILIRUBINUR NEGATIVE 06/16/2016 0100   KETONESUR NEGATIVE 06/16/2016 0100   PROTEINUR NEGATIVE 06/16/2016 0100   UROBILINOGEN 0.2 09/10/2012 0106   NITRITE NEGATIVE 06/16/2016 0100   LEUKOCYTESUR NEGATIVE 06/16/2016 0100   Sepsis Labs Invalid input(s): PROCALCITONIN,  WBC,  LACTICIDVEN Microbiology Recent Results (from the past 240 hour(s))  Respiratory Panel by PCR  Status: None   Collection Time: 04/18/18 11:08 AM  Result Value Ref Range Status   Adenovirus NOT DETECTED NOT DETECTED Final   Coronavirus 229E NOT DETECTED NOT DETECTED Final   Coronavirus HKU1 NOT DETECTED NOT DETECTED Final   Coronavirus NL63 NOT DETECTED NOT DETECTED Final   Coronavirus OC43 NOT DETECTED NOT DETECTED Final   Metapneumovirus NOT DETECTED NOT DETECTED Final   Rhinovirus / Enterovirus NOT DETECTED NOT DETECTED Final   Influenza A NOT DETECTED NOT DETECTED Final   Influenza B NOT DETECTED NOT DETECTED Final   Parainfluenza Virus 1 NOT DETECTED NOT DETECTED Final   Parainfluenza Virus 2 NOT DETECTED NOT DETECTED Final   Parainfluenza Virus 3 NOT DETECTED NOT DETECTED Final   Parainfluenza Virus 4 NOT DETECTED NOT DETECTED Final   Respiratory Syncytial Virus NOT DETECTED NOT DETECTED Final   Bordetella pertussis NOT DETECTED NOT DETECTED Final   Chlamydophila pneumoniae NOT DETECTED NOT DETECTED Final   Mycoplasma pneumoniae NOT DETECTED NOT DETECTED Final    Comment: Performed at [facility], [place] [address], [place], [place] [address]     Time coordinating discharge: 45 minutes  SIGNED:   Tawni Millers, MD  [facility] 04/20/2018, 12:05 PM Pager [phone]  If 7PM-7AM, please contact night-coverage [email or web address] Password  [email or web address]

## 2018-04-23 ENCOUNTER — Telehealth: Payer: Self-pay | Admitting: *Deleted

## 2018-04-23 NOTE — Telephone Encounter (Signed)
Transition Care Management Follow-up Telephone Call  Per Discharge Summary:  Admit date: 04/16/2018 Discharge date: 04/20/2018  Admitted From: Home  Disposition:  Home   Recommendations for Outpatient Follow-up and new medication changes:  1. Follow up with [name] PA in one week.  2.   Home Health: home   Equipment/Devices: none    Discharge Condition: Stable  CODE STATUS: full  Diet recommendation: Heart healthy, salt restricted (2 g per day), and diabetic prudent.   --   How have you been since you were released from the hospital? "Okay, I've still got this runny nose like a lingering cold."   Do you understand why you were in the hospital? yes   Do you understand the discharge instructions? yes   Where were you discharged to? Home   Items Reviewed:  Medications reviewed: yes  Allergies reviewed: yes  Dietary changes reviewed: yes  Referrals reviewed: yes   Functional Questionnaire:  Activities of Daily Living (ADLs):   She states they are independent in the following:  States they require assistance with the following: Patient has a home health aide who helps her with "the basics" but she declined to elaborate further on what this meant.     Any transportation issues/concerns?: no   Any patient concerns? no   Confirmed importance and date/time of follow-up visits scheduled no, PCP is out of the office for the next two weeks and patient declined to schedule with another provider or schedule on PCP return. She scheduled an appointment with Dr. Elsworth Soho and will follow-up with him instead.  Confirmed with patient if condition begins to worsen call PCP or go to the ER.  Patient was given the office number and encouraged to call back with question or concerns.  : yes

## 2018-04-29 ENCOUNTER — Other Ambulatory Visit: Payer: Self-pay | Admitting: Adult Health

## 2018-04-30 NOTE — Progress Notes (Signed)
@Patient  ID: [name], female    DOB: 12/15/1965, 52 y.o.   MRN: [id number]  Chief Complaint  Patient presents with  . Hospitalization Follow-up    States she her breathing has been well, since hospital visit has not had to use inhaler. Patient does have a maintenance inhaler. Needs to know whether to continue prednisone at 27m, and lasix at 662mBID.     Referring provider: NaDorothyann PengNP  HPI: [id number] old female patient managed in our office for obstructive sleep apnea and chronic respiratory failure.  Presumed sarcoid.  Morbidly obese with a BMI of 78. Inflammatory arthritis with positive ANA and follows with rheumatology (August).  Maintained on 20 mg of injectable methotrexate.  Recent [facility] Encounters:   08/2017 - Elsworth Soho OV  And fortunately she remains on low-dose prednisone.  She is taking this sporadically, now takes 10 mg every 3-4 days she has not had a recurrence of skin rash.. Recent blood work reviewed which shows ferritin of 110 and hemoglobin of 12.2.  In the past when she was anemic due to menorrhagia her breathing was worse. CT chest from 01/2017 was reviewed which does not show parenchymal involvement or lymphadenopathy She reports compliance with her oxygen and with he iPAP machine   Tests:   12/2010 for acute dyspnea and hypoxia found to have OHS and OSA with recs for continuous O2 ( 2 L/m at rest &4L/m on walking)and Nocturnal CPAP   CPAP titration 03/2011 (wt 479 lbs) -CPAP 7 cm to stop snoring, No desaturation on O2 2 L/min  10/2014 PFTs-FVC 55%, DLCO 44%  Imaging:  04/17/2018-CT Angio- no evidence of large central pulmonary embolus, cardiac enlargement, similar to previous study 04/16/2018-chest x-ray-pulmonary vascular congestion appears stable over series of exams  Cardiac:  Echo- mild LVH , EF 55-60% . VQ scan intermed prob , doppler neg. CT chest neg for PE.    Labs:  12/2012 -[facility] repeat labs>>neg pANCA and cANCA, ESR 48 and neg ANA , low ACE level    Micro:  10/02/2012 TBBX >>no granulomas, BAL neg afb, fungal    Chart Review:  04/16/2018-hospitalization for shortness of breath >>>Discharged 04/20/2018 >>>Acute on chronic hypoxic respiratory failure pulmonary edema due to acute on chronic diastolic heart failure decompensation >>>treated with IV furosemide, negative fluid balance was achieved with 5.2 L since admission >>>Patient will resume furosemide 60 mg twice daily, close monitoring of fluid and sodium intake.  Continue heart failure management with metoprolol and losartan. >>>Continue home dose of prednisone 10 mg daily, continue home O2 of 2 L   05/01/18 Hospital follow-up  Patient reporting she is feeling much better since getting out of the hospital.  Patient reporting she is monitoring her weight and doing daily weight.  Patient continues to be on daily 10 mg prednisone.  Patient reports that she is been adherent to wearing her CPAP but her card was never returned to her when the home health care company brought the card to our office.  Patient also reporting that it has been 5 years and she would like a new CPAP.  Unfortunately patient has not scheduled to follow-up with primary care as she reports her PCP was on vacation when she called.  Patient knows that she needs to call and schedule an appointment to be seen for a post hospital follow-up.  No Known Allergies  Immunization History  Administered Date(s) Administered  . Influenza Split 08/22/2011  .  Influenza Whole 07/31/2012  . Influenza,inj,Quad PF,6+ Mos 09/16/2013, 07/11/2014, 07/11/2015, 09/04/2017  . Influenza-Unspecified 08/18/2016  . Pneumococcal-Unspecified 08/01/2011    Past Medical History:  Diagnosis Date  . Anemia   . Arthritis    hands, shoulders, no meds  . CHF (congestive heart failure) (HCC)    EF60-65%  . Chronic hyperventilation syndrome    w/ obesity tx  with albuterol inhaler and oxygen 2L  . COPD (chronic obstructive pulmonary disease) ([place])    uses oxygen 2 L  . Gallstones   . GERD (gastroesophageal reflux disease)    diet controlled - no meds  . H/O hiatal hernia   . Hypertension   . Hypothyroidism   . Kidney stones   . Morbid obesity ([place])   . Pneumonia    hoispitalized in 08/2011  . Sarcoidosis   . Seasonal allergies   . Sleep apnea    uses CPAP machine     Tobacco History: Social History   Tobacco Use  Smoking Status Never Smoker  Smokeless Tobacco Never Used   Counseling given: Yes Continue to not smoke  Outpatient Encounter Medications as of 05/01/2018  Medication Sig  . albuterol (PROAIR HFA) 108 (90 Base) MCG/ACT inhaler Inhale 1-2 puffs into the lungs every 6 hours as needed for wheezing.  . cetirizine (ZYRTEC) 10 MG tablet Take 10 mg by mouth at bedtime as needed for allergies.  . cholecalciferol (VITAMIN D) 400 UNITS TABS tablet Take 400 Units by mouth daily.  Marland Kitchen [place] Calcium 1000 (390 CA) MG TABS Take 1,000 mg by mouth daily.   . Cyanocobalamin (VITAMIN B-12) 5000 MCG TBDP Take 1 tablet by mouth once a week.   . diclofenac sodium (VOLTAREN) 1 % GEL APPLY 1 APPLICATION 4 TIMES A DAY AS NEEDED PAIN  . diphenhydrAMINE (BENADRYL) 25 mg capsule Take 25 mg by mouth every 6 (six) hours as needed (fever).  . famotidine (PEPCID) 40 MG tablet Take 1 tablet (40 mg total) by mouth 2 (two) times daily.  . folic acid (FOLVITE) 1 MG tablet Take 1 mg by mouth daily.  . furosemide (LASIX) 20 MG tablet Take 3 tablets (60 mg total) by mouth 2 (two) times daily.  Marland Kitchen HYDROcodone-acetaminophen (NORCO) 10-325 MG tablet Take 1 tablet by mouth every 6 (six) hours as needed (pain).  Marland Kitchen levothyroxine (SYNTHROID, LEVOTHROID) 100 MCG tablet TAKE 1 TABLET (100 MCG TOTAL) BY MOUTH DAILY AT 6 (SIX) AM.  . losartan (COZAAR) 100 MG tablet TAKE 1 TABLET (100 MG TOTAL) DAILY BY MOUTH.  . methotrexate 250 MG/10ML injection INJECT 0.8CC ONCE A WEEK  INJECTION 30 DAYS  . metoprolol succinate (TOPROL-XL) 25 MG 24 hr tablet TAKE 1 TABLET BY MOUTH EVERY DAY  . NON FORMULARY 2 liter of oxygen  . predniSONE (DELTASONE) 10 MG tablet Take 1 tablet (10 mg total) by mouth daily.  Marland Kitchen triamcinolone acetonide (KENALOG) 40 MG/ML injection Inject 40 mg into the muscle every 3 (three) months. For knee pain  . triamcinolone ointment (KENALOG) 0.1 % Apply 1 application topically See admin instructions. Applies twice a day as needed for break out of Sarcoidosis  . vitamin B-12 1000 MCG tablet Take 1 tablet (1,000 mcg total) by mouth daily.   No facility-administered encounter medications on file as of 05/01/2018.      Review of Systems  Constitutional: +fatigue    No  weight loss, night sweats,  fevers, chills HEENT: No headaches,  Difficulty swallowing,  Tooth/dental problems, or  Sore throat, No sneezing,  itching, ear ache, nasal congestion, post nasal drip  CV: +le swelling improved s/p hosp No chest pain,  orthopnea, PND, anasarca, dizziness, palpitations, syncope  GI: No heartburn, indigestion, abdominal pain, nausea, vomiting, diarrhea, change in bowel habits, loss of appetite, bloody stools Resp: +occasional sob with exertion, significantly improved since hosp No shortness of breath at rest.  No excess mucus, no productive cough,  No non-productive cough,  No coughing up of blood.  No change in color of mucus.  No wheezing.  No chest wall deformity Skin: no rash, lesions, no skin changes. GU: no dysuria, change in color of urine, no urgency or frequency.  No flank pain, no hematuria  MS:  No joint pain or swelling.  No decreased range of motion.  No back pain. Psych:  No change in mood or affect. No depression or anxiety.  No memory loss.   Physical Exam  BP 124/88   Pulse 99   Ht 5' 5"  (1.651 m)   Wt (!) 479 lb 12.8 oz (217.6 kg)   LMP  (LMP Unknown)   SpO2 97%   BMI 79.84 kg/m    Wt Readings from Last 3 Encounters:  05/01/18 (!) 479 lb  12.8 oz (217.6 kg)  04/20/18 (!) 476 lb 3.2 oz (216 kg)  02/21/18 (!) 480 lb 12.8 oz (218.1 kg)     GEN: A/Ox3; pleasant , NAD, well nourished, obese   HEENT:  [place]/AT,  EACs-clear, TMs-wnl, NOSE-clear, THROAT-clear, mallampati III, +post nasal drip   NECK:  Supple w/ fair ROM; no JVD; normal carotid impulses w/o bruits; no thyromegaly or nodules palpated (difficult to exam due to body habitus); no lymphadenopathy.    RESP:  +diminished breath sounds, air movement in all lobes on exam no accessory muscle use, no dullness to percussion  CARD: +chronic peripheral edema  RRR, no m/r/g,  pulses intact, no cyanosis or clubbing.  GI:   Soft & nt; nml bowel sounds; no organomegaly or masses detected.   Musco: Warm bil, no deformities or joint swelling noted.   Neuro: alert, no focal deficits noted.    Skin: Warm, no lesions or rashes    Lab Results:  CBC    Component Value Date/Time   WBC 11.0 (H) 04/17/2018 0749   RBC 4.57 04/17/2018 0749   HGB 12.9 04/17/2018 0749   HGB 11.8 04/04/2018 0842   HGB 12.0 10/18/2017 0801   HCT 40.3 04/17/2018 0749   HCT 39.0 10/18/2017 0801   PLT 208 04/17/2018 0749   PLT 193 04/04/2018 0842   PLT 210 10/18/2017 0801   MCV 88.2 04/17/2018 0749   MCV 85.3 10/18/2017 0801   MCH 28.2 04/17/2018 0749   MCHC 32.0 04/17/2018 0749   RDW 18.3 (H) 04/17/2018 0749   RDW 19.8 (H) 10/18/2017 0801   LYMPHSABS 0.5 (L) 04/17/2018 0749   LYMPHSABS 0.4 (L) 10/18/2017 0801   MONOABS 0.2 04/17/2018 0749   MONOABS 0.1 10/18/2017 0801   EOSABS 0.0 04/17/2018 0749   EOSABS 0.0 10/18/2017 0801   BASOSABS 0.0 04/17/2018 0749   BASOSABS 0.0 10/18/2017 0801    BMET    Component Value Date/Time   NA 143 04/20/2018 0414   NA 140 02/09/2018 0835   K 4.5 04/20/2018 0414   CL 101 04/20/2018 0414   CO2 33 (H) 04/20/2018 0414   GLUCOSE 131 (H) 04/20/2018 0414   BUN 40 (H) 04/20/2018 0414   BUN 12 02/09/2018 0835   CREATININE 0.98 04/20/2018 0414   CALCIUM  8.8 (L) 04/20/2018 0414   GFRNONAA >60 04/20/2018 0414   GFRAA >60 04/20/2018 0414    BNP    Component Value Date/Time   BNP 36.0 04/16/2018 2310    ProBNP    Component Value Date/Time   PROBNP 172.1 (H) 12/14/2013 0754    Imaging: Dg Chest 2 View  Result Date: 04/16/2018 CLINICAL DATA:  52 year old female with increasing shortness of breath for 1 week. EXAM: CHEST - 2 VIEW COMPARISON:  CTA chest 01/15/2018 and earlier. FINDINGS: Semi upright AP and lateral views of the chest. Stable cardiomegaly and mediastinal contours. Chronic dependent opacity along the minor fissure most resembles atelectasis. No pneumothorax. No pleural effusion. Pulmonary vascularity appears stable over this series of exams. Crowding of lower lung markings on the lateral view. No consolidation identified. No acute osseous abnormality identified. IMPRESSION: 1. Pulmonary vascular congestion appears stable over this series of exams. Mild if any acute pulmonary edema. 2. Cardiomegaly and atelectasis. Electronically Signed   By: Genevie Ann M.D.   On: 04/16/2018 22:35   Ct Angio Chest Pe W And/or Wo Contrast  Result Date: 04/17/2018 CLINICAL DATA:  Increasing shortness of breath for 3 days. Recent admission to have fluid taken off. History of hypertension, congestive heart failure, COPD, gastroesophageal reflux disease, pneumonia, sarcoidosis, lung biopsy. EXAM: CT ANGIOGRAPHY CHEST WITH CONTRAST TECHNIQUE: Multidetector CT imaging of the chest was performed using the standard protocol during bolus administration of intravenous contrast. Multiplanar CT image reconstructions and MIPs were obtained to evaluate the vascular anatomy. CONTRAST:  181m ISOVUE-370 IOPAMIDOL (ISOVUE-370) INJECTION 76% COMPARISON:  01/15/2018 FINDINGS: Cardiovascular: Evaluation of pulmonary arteries is limited due to poor contrast bolus and motion artifact. There is moderately good opacification of the central pulmonary arteries and no large central  filling defects are demonstrated. Diffuse cardiac enlargement. No pericardial effusions. Normal caliber thoracic aorta. Great vessel origins are patent. Mediastinum/Nodes: Esophagus is decompressed. No significant lymphadenopathy in the chest. Diffuse enlargement of the thyroid gland likely representing thyroid goiter. Similar appearance to previous study. Lungs/Pleura: Evaluation is limited due to motion artifact. There is patchy mosaic attenuation pattern to the lungs. This can indicate air trapping or edema or may be due to motion artifact. There is evidence of atelectasis or edema in the lung bases. Appearances are similar to previous study. No pleural effusions. No pneumothorax. Airways are patent. Upper Abdomen: No acute process demonstrated in the visualized upper abdomen. Musculoskeletal: Degenerative changes in the spine. No destructive bone lesions. Review of the MIP images confirms the above findings. IMPRESSION: 1. Evaluation of pulmonary arteries is limited due to motion artifact and poor contrast bolus but there is no evidence of large central pulmonary embolus. 2. Cardiac enlargement. 3. Mosaic attenuation pattern to the lungs may be due to motion artifact, air trapping, or edema. Atelectasis or edema in the lung bases. Similar to previous study. Electronically Signed   By: WLucienne CapersM.D.   On: 04/17/2018 01:20     Assessment & Plan:   Pleasant patient seen office today.  Patient to follow-up with primary care regarding hospital visit as well as then Lasix dose and monitoring.  Discussed at length importance of following low-sodium diet as well as monitoring weights daily.  Will order new CPAP for patient.  We will have patient have a 8-week follow-up with Dr. AElsworth Sohoas she has not been seen in our office since November of last year.  Will continue daily prednisone at this time.  Follow-up with primary care regarding hospitalization  OSA (obstructive sleep apnea) Will place order for  new CPAP today Use your CPAP every night for at least 4 hours Contact primary care >>> Secure an appointment with primary care for hospital follow-up with  Follow Up with Dr. Elsworth Soho in 8 weeks    Chronic diastolic CHF (congestive heart failure) ([place]) Follow-up with primary care regarding hospitalization  Continue Lasix at this time  Continue to weigh yourself daily >>> If you noticing a weight increase of 3 pounds over 24 hours or 5 pounds over 3 days please contact primary care  Avoid high sodium foods such as fast food, eating out, [place] food, salty snacks such as pretzels or potato chips >>> As eating these items will cause her to retain fluid and work against your Lasix dose    Morbid obesity ([place]) Continue to work towards healthy weight  Sarcoidosis Stable at this time Continue 10 mg daily prednisone     Lauraine Rinne, NP 05/01/2018

## 2018-05-01 ENCOUNTER — Encounter: Payer: Self-pay | Admitting: Pulmonary Disease

## 2018-05-01 ENCOUNTER — Encounter (INDEPENDENT_AMBULATORY_CARE_PROVIDER_SITE_OTHER): Payer: Self-pay | Admitting: Orthopaedic Surgery

## 2018-05-01 ENCOUNTER — Ambulatory Visit (INDEPENDENT_AMBULATORY_CARE_PROVIDER_SITE_OTHER): Payer: Medicare HMO | Admitting: Orthopaedic Surgery

## 2018-05-01 ENCOUNTER — Ambulatory Visit (INDEPENDENT_AMBULATORY_CARE_PROVIDER_SITE_OTHER): Payer: Medicare HMO | Admitting: Pulmonary Disease

## 2018-05-01 ENCOUNTER — Ambulatory Visit: Payer: Medicare HMO | Admitting: *Deleted

## 2018-05-01 VITALS — BP 124/88 | HR 99 | Ht 65.0 in | Wt >= 6400 oz

## 2018-05-01 DIAGNOSIS — G4733 Obstructive sleep apnea (adult) (pediatric): Secondary | ICD-10-CM | POA: Diagnosis not present

## 2018-05-01 DIAGNOSIS — D869 Sarcoidosis, unspecified: Secondary | ICD-10-CM

## 2018-05-01 DIAGNOSIS — I5032 Chronic diastolic (congestive) heart failure: Secondary | ICD-10-CM | POA: Diagnosis not present

## 2018-05-01 DIAGNOSIS — M1712 Unilateral primary osteoarthritis, left knee: Secondary | ICD-10-CM

## 2018-05-01 DIAGNOSIS — M1711 Unilateral primary osteoarthritis, right knee: Secondary | ICD-10-CM | POA: Diagnosis not present

## 2018-05-01 DIAGNOSIS — M170 Bilateral primary osteoarthritis of knee: Secondary | ICD-10-CM

## 2018-05-01 MED ORDER — METHYLPREDNISOLONE ACETATE 40 MG/ML IJ SUSP
40.0000 mg | INTRAMUSCULAR | Status: AC | PRN
  Administered 2018-05-01: 40 mg via INTRA_ARTICULAR

## 2018-05-01 MED ORDER — LIDOCAINE HCL 1 % IJ SOLN
0.5000 mL | INTRAMUSCULAR | Status: AC | PRN
  Administered 2018-05-01: .5 mL

## 2018-05-01 MED ORDER — LIDOCAINE HCL 1 % IJ SOLN
0.5000 mL | INTRAMUSCULAR | Status: AC | PRN
Start: 2018-05-01 — End: 2018-05-01
  Administered 2018-05-01: .5 mL

## 2018-05-01 NOTE — Patient Instructions (Addendum)
Will place order for new CPAP today Use your CPAP every night for at least 4 hours Contact primary care >>> Secure an appointment with primary care for hospital follow-up with  Follow Up with Dr. Elsworth Soho in 8 weeks  Continue to weigh yourself daily >>> If you noticing a weight increase of 3 pounds over 24 hours or 5 pounds over 3 days please contact primary care  Avoid high sodium foods such as fast food, eating out, [place] food, salty snacks such as pretzels or potato chips >>> As eating these items will cause her to retain fluid and work against your Lasix dose       Please contact the office if your symptoms worsen or you have concerns that you are not improving.   Thank you for choosing [facility] for your healthcare, and for allowing [place] to partner with you on your healthcare journey. I am thankful to be able to provide care to you today.   Wyn Quaker FNP-C

## 2018-05-01 NOTE — Assessment & Plan Note (Signed)
Follow-up with primary care regarding hospitalization  Continue Lasix at this time  Continue to weigh yourself daily >>> If you noticing a weight increase of 3 pounds over 24 hours or 5 pounds over 3 days please contact primary care  Avoid high sodium foods such as fast food, eating out, [place] food, salty snacks such as pretzels or potato chips >>> As eating these items will cause her to retain fluid and work against your Lasix dose

## 2018-05-01 NOTE — Progress Notes (Signed)
   Procedure Note HPI: [name] is well-known to Dr. [name] service comes in today due to bilateral knee pain she has end-stage arthritis of both knees.  She denies any injury to either knee.  She is recently been hospitalized due to acute on chronic hypoxic respiratory failure due to her acute cardiogenic pulmonary edema and chronic diastolic heart failure.  She also has sarcoidosis and rheumatoid arthritis.  She states she does feel the steroids that she gets she is hospitalized to help with her knee pain.  She is back on her methotrexate.     Review of systems negative for fever or chills.  Positive for short shortness of breath and chest pain that are present at baseline.  Physical exam: Bilateral knees no effusion abnormal warmth or erythema.  Varus deformities of both knees.  Overall good range of motion of both knees.  Patient: [name]             Date of Birth: 09-07-1966           MRN: [id number]             Visit Date: 05/01/2018  Procedures: Visit Diagnoses: No diagnosis found.  Large Joint Inj: bilateral knee on 05/01/2018 9:35 AM Indications: pain Details: 22 G 1.5 in needle, anterolateral approach  Arthrogram: No  Medications (Right): 0.5 mL lidocaine 1 %; 40 mg methylPREDNISolone acetate 40 MG/ML Medications (Left): 0.5 mL lidocaine 1 %; 40 mg methylPREDNISolone acetate 40 MG/ML Outcome: tolerated well, no immediate complications Consent was given by the patient. Immediately prior to procedure a time out was called to verify the correct patient, procedure, equipment, support staff and site/side marked as required. Patient was prepped and draped in the usual sterile fashion.     Plan: She will follow-up on an as-needed basis.  She understands that she can have cortisone injections both knees as often as every 3 months.  She also understands that she is surgical candidate the injections are basically the only treatment that we have to offer her.

## 2018-05-01 NOTE — Progress Notes (Signed)
Reviewed & agree with plan  

## 2018-05-01 NOTE — Assessment & Plan Note (Signed)
Stable at this time Continue 10 mg daily prednisone

## 2018-05-01 NOTE — Assessment & Plan Note (Signed)
Continue to work towards healthy weight 

## 2018-05-01 NOTE — Assessment & Plan Note (Signed)
Will place order for new CPAP today Use your CPAP every night for at least 4 hours Contact primary care >>> Secure an appointment with primary care for hospital follow-up with  Follow Up with Dr. Elsworth Soho in 8 weeks

## 2018-05-01 NOTE — Addendum Note (Signed)
Addended by: Vivia Ewing on: 05/01/2018 11:17 AM   Modules accepted: Orders

## 2018-05-01 NOTE — Telephone Encounter (Signed)
DENIED.  PT NEEDS AN APPT.

## 2018-05-03 ENCOUNTER — Other Ambulatory Visit: Payer: Self-pay | Admitting: Adult Health

## 2018-05-04 ENCOUNTER — Other Ambulatory Visit: Payer: Self-pay | Admitting: *Deleted

## 2018-05-04 ENCOUNTER — Encounter: Payer: Self-pay | Admitting: *Deleted

## 2018-05-04 NOTE — Patient Outreach (Addendum)
[facility]) Care Management  05/04/2018   [name] 03/03/1966 [id number]  RN Health Coach telephone call to patient.  [place] compliance verified. Per patient she is doing ok. Patient is on continuously O2 at 2 liters. Patient sleeps with a CPAP. Patient weighs every day. Per patient she is aware of the CHF zones and action plan. Patient has assistance from son and a caregiver that comes everyday. Patient uses a walker. Patient is trying to eat a heart healthy diet with low sodium. Patient has transportation to Dr appointments. Patient has an advance directive on file at [facility]. Patient has agreed to outreach monthly calls.    Current Medications:  Current Outpatient Medications  Medication Sig Dispense Refill  . albuterol (PROAIR HFA) 108 (90 Base) MCG/ACT inhaler Inhale 1-2 puffs into the lungs every 6 hours as needed for wheezing. 8.5 g 0  . cetirizine (ZYRTEC) 10 MG tablet Take 10 mg by mouth at bedtime as needed for allergies.    . cholecalciferol (VITAMIN D) 400 UNITS TABS tablet Take 400 Units by mouth daily.    Marland Kitchen Coral Calcium 1000 (390 CA) MG TABS Take 1,000 mg by mouth daily.     . Cyanocobalamin (VITAMIN B-12) 5000 MCG TBDP Take 1 tablet by mouth once a week.     . diclofenac sodium (VOLTAREN) 1 % GEL APPLY 1 APPLICATION 4 TIMES A DAY AS NEEDED PAIN  6  . diphenhydrAMINE (BENADRYL) 25 mg capsule Take 25 mg by mouth every 6 (six) hours as needed (fever).    . famotidine (PEPCID) 40 MG tablet Take 1 tablet (40 mg total) by mouth 2 (two) times daily. 60 tablet 0  . folic acid (FOLVITE) 1 MG tablet Take 1 mg by mouth daily.  3  . furosemide (LASIX) 20 MG tablet Take 3 tablets (60 mg total) by mouth 2 (two) times daily. 180 tablet 1  . HYDROcodone-acetaminophen (NORCO) 10-325 MG tablet Take 1 tablet by mouth every 6 (six) hours as needed (pain). 30 tablet 0  . levothyroxine (SYNTHROID, LEVOTHROID) 100 MCG tablet TAKE 1 TABLET (100 MCG TOTAL)  BY MOUTH DAILY AT 6 (SIX) AM. 30 tablet 0  . losartan (COZAAR) 100 MG tablet TAKE 1 TABLET (100 MG TOTAL) DAILY BY MOUTH. 30 tablet 0  . methotrexate 250 MG/10ML injection INJECT 0.8CC ONCE A WEEK INJECTION 30 DAYS  3  . metoprolol succinate (TOPROL-XL) 25 MG 24 hr tablet TAKE 1 TABLET BY MOUTH EVERY DAY 30 tablet 0  . NON FORMULARY 2 liter of oxygen    . predniSONE (DELTASONE) 10 MG tablet Take 1 tablet (10 mg total) by mouth daily. 10 tablet 0  . triamcinolone acetonide (KENALOG) 40 MG/ML injection Inject 40 mg into the muscle every 3 (three) months. For knee pain    . triamcinolone ointment (KENALOG) 0.1 % Apply 1 application topically See admin instructions. Applies twice a day as needed for break out of Sarcoidosis    . vitamin B-12 1000 MCG tablet Take 1 tablet (1,000 mcg total) by mouth daily. 30 tablet 0   No current facility-administered medications for this visit.     Functional Status:  In your present state of health, do you have any difficulty performing the following activities: 05/04/2018 04/17/2018  Hearing? N N  Vision? - N  Difficulty concentrating or making decisions? N N  Walking or climbing stairs? Y Y  Dressing or bathing? Y Y  Doing errands, shopping? N N  Conservation officer, nature and  eating ? Y -  Comment cargiver assists -  Using the Toilet? N -  In the past six months, have you accidently leaked urine? N -  Do you have problems with loss of bowel control? N -  Managing your Medications? N -  Managing your Finances? N -  Housekeeping or managing your Housekeeping? Y -  Some recent data might be hidden    Fall/Depression Screening: Fall Risk  05/04/2018 02/24/2016 02/24/2016  Falls in the past year? No No No   PHQ 2/9 Scores 05/04/2018 02/24/2016 02/24/2016 02/24/2016  PHQ - 2 Score 0 0 0 0   THN CM Care Plan Problem One     Most Recent Value  Care Plan Problem One  Knowledge Deficit in Self Management of CHF  Role Documenting the Problem One  [place] for  Problem One  Active  THN Long Term Goal   Patient will not have any readmissions for Congestive Heart Failure within the next 90 days  THN Long Term Goal Start Date  05/04/18  Interventions for Problem One Long Term Goal  RN discussed the zones of congetive heart failure. RN discussed medication adherence. RN will follow up with further discussio and teach back  THN CM Short Term Goal #1   Patient will  report weighing and documenting weight withn the next 30 days  THN CM Short Term Goal #1 Start Date  05/04/18  Interventions for Short Term Goal #1  RN discussed with patient about weighing daily.RN sent patient a 2019 Calendar book for documentation. RN will follow up withi further discussion and teach back.  THN CM Short Term Goal #2   RN will verbalize monitoring blood pressure within the next 30 days  THN CM Short Term Goal #2 Start Date  05/04/18  Interventions for Short Term Goal #2  RN discussed checking blood pressure. RN is sending patinet a blood pressure monitor. RN send patient a calendar book to record bp. RN will folllow  THN CM Short Term Goal #3  Patient will verbalize receiving educational material on low sodium diet within the next 30 days  THN CM Short Term Goal #3 Start Date  05/04/18  Interventions for Short Tern Goal #3  RN diacussed eating low sodium diet. RN sent facial sheet on low sodium foods. RN sent educational material on low sodium diet. RN will follow up withi further discussion        Assessment:  Patient weighs every day Patient done not check blood pressure Patient knows what the zones and Action plan of CHF mean Patient will benefit from [place] telephonic outreach for education and support for CHF self management Patient uses a CPAP Patient is on O2 2L continously Plan: RN discussed daily weights  And documenting RN discussed checking blood pressure RN sent BP monitor RN discussed low sodium diet RN sent 2019 Calendar book for documentation RN  sent Educational material on low sodium diet RN sent picture chart on foods high and low in sodium RN will follow up outreach within the month of August RN sent barriers letter and assessment to PCP  [place] Management [phone]

## 2018-05-04 NOTE — Telephone Encounter (Signed)
Sent to the pharmacy by e-scribe for 30 days.  Pt has follow up with Tommi Rumps on 05/10/18.

## 2018-05-09 DIAGNOSIS — H5213 Myopia, bilateral: Secondary | ICD-10-CM | POA: Diagnosis not present

## 2018-05-09 DIAGNOSIS — H11153 Pinguecula, bilateral: Secondary | ICD-10-CM | POA: Diagnosis not present

## 2018-05-09 DIAGNOSIS — H25013 Cortical age-related cataract, bilateral: Secondary | ICD-10-CM | POA: Diagnosis not present

## 2018-05-09 DIAGNOSIS — H11823 Conjunctivochalasis, bilateral: Secondary | ICD-10-CM | POA: Diagnosis not present

## 2018-05-09 DIAGNOSIS — H11423 Conjunctival edema, bilateral: Secondary | ICD-10-CM | POA: Diagnosis not present

## 2018-05-09 DIAGNOSIS — H35033 Hypertensive retinopathy, bilateral: Secondary | ICD-10-CM | POA: Diagnosis not present

## 2018-05-09 DIAGNOSIS — H0100A Unspecified blepharitis right eye, upper and lower eyelids: Secondary | ICD-10-CM | POA: Diagnosis not present

## 2018-05-09 DIAGNOSIS — H2513 Age-related nuclear cataract, bilateral: Secondary | ICD-10-CM | POA: Diagnosis not present

## 2018-05-09 DIAGNOSIS — H0100B Unspecified blepharitis left eye, upper and lower eyelids: Secondary | ICD-10-CM | POA: Diagnosis not present

## 2018-05-09 DIAGNOSIS — H18413 Arcus senilis, bilateral: Secondary | ICD-10-CM | POA: Diagnosis not present

## 2018-05-10 ENCOUNTER — Inpatient Hospital Stay: Payer: Medicare HMO | Admitting: Adult Health

## 2018-05-11 ENCOUNTER — Telehealth: Payer: Self-pay | Admitting: Hematology

## 2018-05-11 NOTE — Telephone Encounter (Signed)
Pt called in and wanted to reschedule 7/17 lab to 7/18 - gave her updated appt.

## 2018-05-16 ENCOUNTER — Other Ambulatory Visit: Payer: Medicare HMO

## 2018-05-17 ENCOUNTER — Encounter: Payer: Self-pay | Admitting: Adult Health

## 2018-05-17 ENCOUNTER — Ambulatory Visit (INDEPENDENT_AMBULATORY_CARE_PROVIDER_SITE_OTHER): Payer: Medicare HMO | Admitting: Adult Health

## 2018-05-17 ENCOUNTER — Inpatient Hospital Stay: Payer: Medicare HMO | Attending: Hematology

## 2018-05-17 VITALS — BP 160/100 | HR 81 | Temp 98.3°F | Wt >= 6400 oz

## 2018-05-17 DIAGNOSIS — I5033 Acute on chronic diastolic (congestive) heart failure: Secondary | ICD-10-CM

## 2018-05-17 DIAGNOSIS — D500 Iron deficiency anemia secondary to blood loss (chronic): Secondary | ICD-10-CM | POA: Insufficient documentation

## 2018-05-17 DIAGNOSIS — J441 Chronic obstructive pulmonary disease with (acute) exacerbation: Secondary | ICD-10-CM | POA: Diagnosis not present

## 2018-05-17 LAB — CBC WITH DIFFERENTIAL (CANCER CENTER ONLY)
BASOS ABS: 0 10*3/uL (ref 0.0–0.1)
Basophils Relative: 0 %
EOS ABS: 0 10*3/uL (ref 0.0–0.5)
EOS PCT: 0 %
HCT: 39.1 % (ref 34.8–46.6)
Hemoglobin: 12.4 g/dL (ref 11.6–15.9)
Lymphocytes Relative: 5 %
Lymphs Abs: 0.5 10*3/uL — ABNORMAL LOW (ref 0.9–3.3)
MCH: 27.2 pg (ref 25.1–34.0)
MCHC: 31.7 g/dL (ref 31.5–36.0)
MCV: 85.7 fL (ref 79.5–101.0)
MONO ABS: 0.2 10*3/uL (ref 0.1–0.9)
Monocytes Relative: 2 %
Neutro Abs: 10.1 10*3/uL — ABNORMAL HIGH (ref 1.5–6.5)
Neutrophils Relative %: 93 %
PLATELETS: 263 10*3/uL (ref 145–400)
RBC: 4.56 MIL/uL (ref 3.70–5.45)
RDW: 16.7 % — AB (ref 11.2–14.5)
WBC: 10.8 10*3/uL — AB (ref 3.9–10.3)

## 2018-05-17 LAB — RETICULOCYTES
RBC.: 4.56 MIL/uL (ref 3.70–5.45)
RETIC COUNT ABSOLUTE: 59.3 10*3/uL (ref 33.7–90.7)
RETIC CT PCT: 1.3 % (ref 0.7–2.1)

## 2018-05-17 LAB — IRON AND TIBC
IRON: 21 ug/dL — AB (ref 41–142)
SATURATION RATIOS: 6 % — AB (ref 21–57)
TIBC: 343 ug/dL (ref 236–444)
UIBC: 321 ug/dL

## 2018-05-17 LAB — FERRITIN: FERRITIN: 41 ng/mL (ref 11–307)

## 2018-05-17 NOTE — Progress Notes (Addendum)
Subjective:    Patient ID: [name], female    DOB: [birth year], 52 y.o.   MRN: [id number]  HPI  52 year old female who  has a past medical history of Anemia, Arthritis, CHF (congestive heart failure) ([place]), Chronic hyperventilation syndrome, COPD (chronic obstructive pulmonary disease) ([place]), Gallstones, GERD (gastroesophageal reflux disease), H/O hiatal hernia, Hypertension, Hypothyroidism, Kidney stones, Morbid obesity ([place]), Pneumonia, Sarcoidosis, Seasonal allergies, and Sleep apnea.  She presents to the office today for hospital follow up  She was admitted on 617 and discharged on 04/20/2018 for dyspnea  She has been seen by pulmonary since her hospital discharge has a follow-up appointment with Dr. Elsworth Soho in approximately 6 weeks.  Presented to the emergency room with shortness of breath and was admitted with acute on chronic hypoxic respiratory failure due to acute cardiogenic pulmonary edema due to acute on chronic diastolic heart failure decompensation.  She received aggressive diuresis with IV furosemide and had significant improvement in her symptoms.  Upon discharge she was resumed on her home dose of furosemide 60 mg twice a day with close monitoring of fluid and sodium intake.  She was continued on heart failure management with beta-blocker and ARB.  Echo showed normal LV systolic function with 60 to 65% EF, mild dilated right ventricle, peak PA pressure of 36  Today in the office she reports that her breathing is "completely fine".  She has not had to use supplemental oxygen many times since being discharged from the hospital.  She is taking all of her medications as directed and pulmonary is going to get her a new CPAP.  Has no acute complaints today  Wt Readings from Last 3 Encounters:  05/17/18 (!) 477 lb (216.4 kg)  05/01/18 (!) 479 lb 12.8 oz (217.6 kg)  04/20/18 (!) 476 lb 3.2 oz (216 kg)     Review of Systems  Constitutional: Negative.   HENT: Negative.    Respiratory: Positive for shortness of breath (occassional ). Negative for apnea, chest tightness, wheezing and stridor.   Cardiovascular: Positive for leg swelling.  Gastrointestinal: Negative.   Genitourinary: Negative.   Musculoskeletal: Positive for arthralgias and back pain.  Neurological: Negative.   Hematological: Negative.   Psychiatric/Behavioral: Negative.   All other systems reviewed and are negative.     Past Medical History:  Diagnosis Date  . Anemia   . Arthritis    hands, shoulders, no meds  . CHF (congestive heart failure) (HCC)    EF60-65%  . Chronic hyperventilation syndrome    w/ obesity tx with albuterol inhaler and oxygen 2L  . COPD (chronic obstructive pulmonary disease) ([place])    uses oxygen 2 L  . Gallstones   . GERD (gastroesophageal reflux disease)    diet controlled - no meds  . H/O hiatal hernia   . Hypertension   . Hypothyroidism   . Kidney stones   . Morbid obesity ([place])   . Pneumonia    hoispitalized in 08/2011  . Sarcoidosis   . Seasonal allergies   . Sleep apnea    uses CPAP machine     Social History   Socioeconomic History  . Marital status: Single    Spouse name: Not on file  . Number of children: Not on file  . Years of education: Not on file  . Highest education level: Not on file  Occupational History  . Occupation: unemployeed  Social Needs  . Financial resource strain: Not on file  . Food insecurity:  Worry: Not on file    Inability: Not on file  . Transportation needs:    Medical: Not on file    Non-medical: Not on file  Tobacco Use  . Smoking status: Never Smoker  . Smokeless tobacco: Never Used  Substance and Sexual Activity  . Alcohol use: Yes    Comment: occasionally  . Drug use: No  . Sexual activity: Never    Birth control/protection: None  Lifestyle  . Physical activity:    Days per week: Not on file    Minutes per session: Not on file  . Stress: Not on file  Relationships  . Social  connections:    Talks on phone: Not on file    Gets together: Not on file    Attends religious service: Not on file    Active member of club or organization: Not on file    Attends meetings of clubs or organizations: Not on file    Relationship status: Not on file  . Intimate partner violence:    Fear of current or ex partner: Not on file    Emotionally abused: Not on file    Physically abused: Not on file    Forced sexual activity: Not on file  Other Topics Concern  . Not on file  Social History Narrative   Son lives with patient.   Divorced for eight or nine years   Works from home for [employer].     Past Surgical History:  Procedure Laterality Date  . [place]   x 1  . CHOLECYSTECTOMY  2000  . DILATION AND CURETTAGE OF UTERUS N/A 08/09/2016   Procedure: DILATATION AND CURETTAGE;  Surgeon: Everitt Amber, MD;  Location: [facility];  Service: Gynecology;  Laterality: N/A;  . HYSTEROSCOPY W/D&C  12/27/2011   Procedure: DILATATION AND CURETTAGE /HYSTEROSCOPY;  Surgeon: Maeola Sarah. Landry Mellow, MD;  Location: [facility];  Service: Gynecology;;  . I and D of abcess  05/2011  . INTRAUTERINE DEVICE (IUD) INSERTION N/A 08/09/2016   Procedure: INTRAUTERINE DEVICE (IUD) INSERTION;  Surgeon: Everitt Amber, MD;  Location: [facility];  Service: Gynecology;  Laterality: N/A;  . LUNG BIOPSY    . uterine abletion      Family History  Problem Relation Age of Onset  . Diabetes Father   . Cancer Father 51       colon cancer   . Diabetes Brother   . Deep vein thrombosis Mother   . Aneurysm Sister        d/o brain aneurysm    No Known Allergies  Current Outpatient Medications on File Prior to Visit  Medication Sig Dispense Refill  . albuterol (PROAIR HFA) 108 (90 Base) MCG/ACT inhaler Inhale 1-2 puffs into the lungs every 6 hours as needed for wheezing. 8.5 g 0  . cetirizine (ZYRTEC) 10 MG tablet Take 10 mg by mouth at bedtime as needed for allergies.    . cholecalciferol (VITAMIN D) 400 UNITS TABS  tablet Take 400 Units by mouth daily.    Marland Kitchen Coral Calcium 1000 (390 CA) MG TABS Take 1,000 mg by mouth daily.     . Cyanocobalamin (VITAMIN B-12) 5000 MCG TBDP Take 1 tablet by mouth once a week.     . diclofenac sodium (VOLTAREN) 1 % GEL APPLY 1 APPLICATION 4 TIMES A DAY AS NEEDED PAIN  6  . diphenhydrAMINE (BENADRYL) 25 mg capsule Take 25 mg by mouth every 6 (six) hours as needed (fever).    Marland Kitchen  famotidine (PEPCID) 40 MG tablet Take 1 tablet (40 mg total) by mouth 2 (two) times daily. 60 tablet 0  . folic acid (FOLVITE) 1 MG tablet Take 1 mg by mouth daily.  3  . furosemide (LASIX) 20 MG tablet Take 3 tablets (60 mg total) by mouth 2 (two) times daily. 180 tablet 1  . HYDROcodone-acetaminophen (NORCO) 10-325 MG tablet Take 1 tablet by mouth every 6 (six) hours as needed (pain). 30 tablet 0  . levothyroxine (SYNTHROID, LEVOTHROID) 100 MCG tablet TAKE 1 TABLET (100 MCG TOTAL) BY MOUTH DAILY AT 6 (SIX) AM. 30 tablet 0  . losartan (COZAAR) 100 MG tablet TAKE 1 TABLET (100 MG TOTAL) DAILY BY MOUTH. 30 tablet 0  . methotrexate 250 MG/10ML injection INJECT 0.8CC ONCE A WEEK INJECTION 30 DAYS  3  . metoprolol succinate (TOPROL-XL) 25 MG 24 hr tablet TAKE 1 TABLET BY MOUTH EVERY DAY 30 tablet 0  . NON FORMULARY 2 liter of oxygen    . predniSONE (DELTASONE) 10 MG tablet Take 1 tablet (10 mg total) by mouth daily. 10 tablet 0  . triamcinolone acetonide (KENALOG) 40 MG/ML injection Inject 40 mg into the muscle every 3 (three) months. For knee pain    . triamcinolone ointment (KENALOG) 0.1 % Apply 1 application topically See admin instructions. Applies twice a day as needed for break out of Sarcoidosis    . vitamin B-12 1000 MCG tablet Take 1 tablet (1,000 mcg total) by mouth daily. 30 tablet 0   No current facility-administered medications on file prior to visit.     BP (!) 160/100 (BP Location: Left Wrist)   Pulse 81   Temp 98.3 F (36.8 C) (Oral)   Wt (!) 477 lb (216.4 kg)   LMP  (LMP Unknown)    SpO2 94%   BMI 79.38 kg/m       Objective:   Physical Exam  Constitutional: She is oriented to person, place, and time. She appears well-developed and well-nourished. No distress.  Morbidly obese  Eyes: Pupils are equal, round, and reactive to light. EOM are normal.  Cardiovascular: Normal rate, regular rhythm, normal heart sounds and intact distal pulses. Exam reveals no gallop and no friction rub.  No murmur heard. Pulmonary/Chest: Effort normal and breath sounds normal. No stridor. No respiratory distress. She has no wheezes. She has no rales. She exhibits no tenderness.  Musculoskeletal: She exhibits edema (chronic ).  Neurological: She is alert and oriented to person, place, and time.  Skin: Skin is warm and dry. Capillary refill takes 2 to 3 seconds. She is not diaphoretic.  Psychiatric: She has a normal mood and affect. Her behavior is normal. Judgment and thought content normal.  Nursing note and vitals reviewed.     Assessment & Plan:  1. COPD exacerbation ([place]) - Reviewed labs, imagine and hospital notes in detail.  - seems to have resolved  - continue with POC by Pulmonary   2. Diastolic dysfunction with acute on chronic heart failure (HCC) - Continue with BB, ARB, and Lasix   3. Morbid obesity ([place]) - Encouraged weight loss.   * She is going to follow up for CPE this fall.    [name], NP

## 2018-05-24 ENCOUNTER — Other Ambulatory Visit: Payer: Self-pay | Admitting: Adult Health

## 2018-05-24 NOTE — Telephone Encounter (Signed)
Assessment & Plan:  1. COPD exacerbation ([place]) - Reviewed labs, imagine and hospital notes in detail.  - seems to have resolved  - continue with POC by Pulmonary   2. Diastolic dysfunction with acute on chronic heart failure (HCC) - Continue with BB, ARB, and Lasix   3. Morbid obesity ([place]) - Encouraged weight loss.   * She is going to follow up for CPE this fall.    [name], NP   Pt to follow up in the fall for cpx.  Will fill for 90 days.

## 2018-05-25 DIAGNOSIS — E669 Obesity, unspecified: Secondary | ICD-10-CM | POA: Diagnosis not present

## 2018-05-25 DIAGNOSIS — R0602 Shortness of breath: Secondary | ICD-10-CM | POA: Diagnosis not present

## 2018-05-25 DIAGNOSIS — E662 Morbid (severe) obesity with alveolar hypoventilation: Secondary | ICD-10-CM | POA: Diagnosis not present

## 2018-05-25 DIAGNOSIS — G4733 Obstructive sleep apnea (adult) (pediatric): Secondary | ICD-10-CM | POA: Diagnosis not present

## 2018-05-25 DIAGNOSIS — D869 Sarcoidosis, unspecified: Secondary | ICD-10-CM | POA: Diagnosis not present

## 2018-05-25 DIAGNOSIS — R918 Other nonspecific abnormal finding of lung field: Secondary | ICD-10-CM | POA: Diagnosis not present

## 2018-05-29 ENCOUNTER — Other Ambulatory Visit: Payer: Self-pay | Admitting: Adult Health

## 2018-05-31 NOTE — Telephone Encounter (Signed)
Sent to the pharmacy by e-scribe for 90 days.  Pt to follow up in the fall for cpx.

## 2018-06-05 ENCOUNTER — Other Ambulatory Visit: Payer: Self-pay | Admitting: *Deleted

## 2018-06-05 NOTE — Patient Outreach (Signed)
[facility]) Care Management  06/05/2018  [name] 13-Oct-1966 [id number]  [place] attempted#1  follow up outreach call to patient home number no answer. Telephone call to mobile number.  Patient was unavailable. HIPPA compliance voicemail message left with return callback number.  Plan: RN will call patient again within 10 business days.  [place] Care Management [phone]

## 2018-06-09 DIAGNOSIS — M069 Rheumatoid arthritis, unspecified: Secondary | ICD-10-CM | POA: Diagnosis not present

## 2018-06-09 DIAGNOSIS — D869 Sarcoidosis, unspecified: Secondary | ICD-10-CM | POA: Diagnosis not present

## 2018-06-09 DIAGNOSIS — J449 Chronic obstructive pulmonary disease, unspecified: Secondary | ICD-10-CM | POA: Diagnosis not present

## 2018-06-09 DIAGNOSIS — I110 Hypertensive heart disease with heart failure: Secondary | ICD-10-CM | POA: Diagnosis not present

## 2018-06-09 DIAGNOSIS — J9610 Chronic respiratory failure, unspecified whether with hypoxia or hypercapnia: Secondary | ICD-10-CM | POA: Diagnosis not present

## 2018-06-09 DIAGNOSIS — Z6841 Body Mass Index (BMI) 40.0 and over, adult: Secondary | ICD-10-CM | POA: Diagnosis not present

## 2018-06-09 DIAGNOSIS — E039 Hypothyroidism, unspecified: Secondary | ICD-10-CM | POA: Diagnosis not present

## 2018-06-09 DIAGNOSIS — I509 Heart failure, unspecified: Secondary | ICD-10-CM | POA: Diagnosis not present

## 2018-06-09 DIAGNOSIS — G4730 Sleep apnea, unspecified: Secondary | ICD-10-CM | POA: Diagnosis not present

## 2018-06-20 DIAGNOSIS — M159 Polyosteoarthritis, unspecified: Secondary | ICD-10-CM | POA: Diagnosis not present

## 2018-06-20 DIAGNOSIS — Z6841 Body Mass Index (BMI) 40.0 and over, adult: Secondary | ICD-10-CM | POA: Diagnosis not present

## 2018-06-20 DIAGNOSIS — D860 Sarcoidosis of lung: Secondary | ICD-10-CM | POA: Diagnosis not present

## 2018-06-20 DIAGNOSIS — M064 Inflammatory polyarthropathy: Secondary | ICD-10-CM | POA: Diagnosis not present

## 2018-06-20 DIAGNOSIS — R0602 Shortness of breath: Secondary | ICD-10-CM | POA: Diagnosis not present

## 2018-06-20 DIAGNOSIS — Z79899 Other long term (current) drug therapy: Secondary | ICD-10-CM | POA: Diagnosis not present

## 2018-06-25 DIAGNOSIS — D869 Sarcoidosis, unspecified: Secondary | ICD-10-CM | POA: Diagnosis not present

## 2018-06-25 DIAGNOSIS — E662 Morbid (severe) obesity with alveolar hypoventilation: Secondary | ICD-10-CM | POA: Diagnosis not present

## 2018-06-25 DIAGNOSIS — E669 Obesity, unspecified: Secondary | ICD-10-CM | POA: Diagnosis not present

## 2018-06-25 DIAGNOSIS — R0602 Shortness of breath: Secondary | ICD-10-CM | POA: Diagnosis not present

## 2018-06-25 DIAGNOSIS — R918 Other nonspecific abnormal finding of lung field: Secondary | ICD-10-CM | POA: Diagnosis not present

## 2018-06-25 DIAGNOSIS — G4733 Obstructive sleep apnea (adult) (pediatric): Secondary | ICD-10-CM | POA: Diagnosis not present

## 2018-06-26 ENCOUNTER — Telehealth: Payer: Self-pay | Admitting: Family Medicine

## 2018-06-26 NOTE — Telephone Encounter (Signed)
Copied from [place] [id number]. Topic: Appointment Scheduling - Scheduling Inquiry for Clinic >> Jun 26, 2018  9:26 AM [name] wrote: Pt states that she has not heard anything back about scheduling her AWV. Pt states that she really needs to have it tomorrow and is upset that she has not heard back. Pt would like a call back at [phone] as soon as possible.

## 2018-06-27 ENCOUNTER — Inpatient Hospital Stay: Payer: Medicare HMO | Attending: Hematology

## 2018-06-29 ENCOUNTER — Ambulatory Visit: Payer: Self-pay | Admitting: *Deleted

## 2018-06-29 ENCOUNTER — Telehealth: Payer: Self-pay | Admitting: Family Medicine

## 2018-06-29 NOTE — Telephone Encounter (Signed)
Copied from [place] [id number]. Topic: Quick Communication - Office Called Patient >> Jun 28, 2018  2:06 PM [place], [place]ed wrote: Reason for CRM: Ok to schedule AWV for any time. >> Jun 28, 2018  4:13 PM Oliver Pila B wrote: Pt called back w/ frustration; pt is unaware if she is needing a cpe or a medicare awv; please contact pt to verify or leave a message for [facility] on which is needed, standard cpe or AWV(medicare)

## 2018-06-29 NOTE — Telephone Encounter (Signed)
Left a message for a return call.  CRM created. 

## 2018-07-03 ENCOUNTER — Telehealth: Payer: Self-pay | Admitting: Adult Health

## 2018-07-03 NOTE — Telephone Encounter (Signed)
Pt wanted to come in this Thursday 9/05/for CPE.  However, no appts available.  Pt was upset and called [place] unprofessional.  Pt declined to make appt, and states this office is not for her.

## 2018-07-03 NOTE — Telephone Encounter (Signed)
Spoke with pt today about scheduling her AWV.  She explained the reason for the appt and we both agreed that she needed a CPE.  She only wanted to be scheduled on the 5th or the 6th.  I apologized and explained that he was full and the next available date for her to see Tommi Rumps was the 19th.  She was not happy, and very nicely told me that we were unprofessional because we could not get her an appt this week.  She also voiced that she was unhappy that it took [place] so long to call her back.  She refused to schedule an appt and told me that we are not the office for her.

## 2018-07-03 NOTE — Telephone Encounter (Signed)
Noted  

## 2018-07-03 NOTE — Telephone Encounter (Signed)
Left a message for a return call.

## 2018-07-03 NOTE — Telephone Encounter (Signed)
[place] notified.

## 2018-07-05 ENCOUNTER — Ambulatory Visit: Payer: Medicare HMO | Admitting: Adult Health

## 2018-07-06 ENCOUNTER — Other Ambulatory Visit: Payer: Self-pay | Admitting: *Deleted

## 2018-07-06 NOTE — Patient Outreach (Signed)
[facility]) Care Management  07/06/2018  [name] 24-Mar-1966 [id number]  RN Health Coach attempted #2  follow up outreach call to patient.  Patient was unavailable. Home phone no answer. Mobile phone voice mail started to play and it disconnected. Plan: RN will call patient again within 30 days.  [place] Care Management [phone]

## 2018-07-09 DIAGNOSIS — R69 Illness, unspecified: Secondary | ICD-10-CM | POA: Diagnosis not present

## 2018-07-11 ENCOUNTER — Other Ambulatory Visit: Payer: Self-pay | Admitting: Adult Health

## 2018-07-11 NOTE — Telephone Encounter (Signed)
Tommi Rumps, this was last filled 08/2017 for 30 day supply.  TSH taken on 04/17/18 was normal.  Please advise.

## 2018-07-11 NOTE — Telephone Encounter (Signed)
If she has not taken for that amount of time, she likely does not need it

## 2018-07-12 NOTE — Telephone Encounter (Signed)
Spoke to the pt and she informed me that Tommi Rumps has been the one refilling her prescriptions.  Informed her that Tommi Rumps has not filled this since November of last year.  She said she has been taking the medication throughout the year and Cory's name has been on her prescriptions.  I called the pharmacy.  They informed me that the pt filled the prescription that was sent in November of last year but did not fill it until 06/18/18.  She has not been taking her medication.  Please advise. Pt asking for a 90 day supply.

## 2018-07-12 NOTE — Telephone Encounter (Signed)
Her thyroid level was normal in June 2019. She picked her prescription up on 06/18/2018 for the one that was written on 05/2017. She was not taking synthroid in June when she had a normal level, which makes me believe that she may not need the medication.   I would like to retest her in one month without the medication being in her system

## 2018-07-13 NOTE — Telephone Encounter (Signed)
Spoke to the pt and informed her not to take levothyroxine any longer until lab appt on 08/16/18.  Pt agreed.  No further action required.

## 2018-07-13 NOTE — Telephone Encounter (Signed)
Left a message for a return call.

## 2018-07-23 ENCOUNTER — Other Ambulatory Visit: Payer: Self-pay | Admitting: *Deleted

## 2018-07-23 NOTE — Patient Outreach (Signed)
[facility]) Care Management  07/23/2018  [name] August 11, 1966 [id number]   [place] attempted #3 follow up outreach call to patient. No voicemail pick up.  Plan: Multiple outreach attempts to establish contact with patient without success. No response from letter mailed to patient. Case is being close at this time. Closure letter to be sent to patient and physician.  [place] Care Management [phone]

## 2018-07-26 DIAGNOSIS — E669 Obesity, unspecified: Secondary | ICD-10-CM | POA: Diagnosis not present

## 2018-07-26 DIAGNOSIS — G4733 Obstructive sleep apnea (adult) (pediatric): Secondary | ICD-10-CM | POA: Diagnosis not present

## 2018-07-26 DIAGNOSIS — R0602 Shortness of breath: Secondary | ICD-10-CM | POA: Diagnosis not present

## 2018-07-26 DIAGNOSIS — R918 Other nonspecific abnormal finding of lung field: Secondary | ICD-10-CM | POA: Diagnosis not present

## 2018-07-26 DIAGNOSIS — D869 Sarcoidosis, unspecified: Secondary | ICD-10-CM | POA: Diagnosis not present

## 2018-07-26 DIAGNOSIS — E662 Morbid (severe) obesity with alveolar hypoventilation: Secondary | ICD-10-CM | POA: Diagnosis not present

## 2018-07-31 DIAGNOSIS — H2513 Age-related nuclear cataract, bilateral: Secondary | ICD-10-CM | POA: Diagnosis not present

## 2018-07-31 DIAGNOSIS — H02834 Dermatochalasis of left upper eyelid: Secondary | ICD-10-CM | POA: Diagnosis not present

## 2018-07-31 DIAGNOSIS — H25043 Posterior subcapsular polar age-related cataract, bilateral: Secondary | ICD-10-CM | POA: Diagnosis not present

## 2018-07-31 DIAGNOSIS — H18413 Arcus senilis, bilateral: Secondary | ICD-10-CM | POA: Diagnosis not present

## 2018-07-31 DIAGNOSIS — H25013 Cortical age-related cataract, bilateral: Secondary | ICD-10-CM | POA: Diagnosis not present

## 2018-07-31 DIAGNOSIS — H2512 Age-related nuclear cataract, left eye: Secondary | ICD-10-CM | POA: Diagnosis not present

## 2018-08-01 ENCOUNTER — Telehealth: Payer: Self-pay

## 2018-08-01 NOTE — Telephone Encounter (Signed)
    [facility] Pre-operative Risk Assessment    Request for surgical clearance:  1. What type of surgery is being performed?  Cataract extraction w Intraocular Lens implantation of the Left Eye/Right Eye   2. When is this surgery scheduled?  09/17/18   3. What type of clearance is required (medical clearance vs. Pharmacy clearance to hold med vs. Both)?  medical  4. Are there any medications that need to be held prior to surgery and how long?    5. Practice name and name of physician performing surgery?  [facility]/ Dr Talbert Forest   6. What is your office phone number [phone]    7.   What is your office fax number [phone]  8.   Anesthesia type (None, local, MAC, general) ?    [name]  [name] 08/01/2018, 11:31 AM  _________________________________________________________________   (provider comments below)

## 2018-08-02 NOTE — Telephone Encounter (Signed)
   Primary Cardiologist: Candee Furbish, MD  Chart reviewed as part of pre-operative protocol coverage. Cataract extractions are recognized in guidelines as low risk surgeries that do not typically require specific preoperative testing or holding of blood thinner therapy. Therefore, given past medical history and time since last visit, based on ACC/AHA guidelines, [name] would be at acceptable risk for the planned procedure without further cardiovascular testing.   I will route this recommendation to the requesting party via Epic fax function and remove from pre-op pool.  Please call with questions.  Lyda Jester, PA-C 08/02/2018, 2:12 PM

## 2018-08-08 ENCOUNTER — Ambulatory Visit: Payer: Medicare HMO | Admitting: Hematology

## 2018-08-08 ENCOUNTER — Other Ambulatory Visit: Payer: Medicare HMO

## 2018-08-09 ENCOUNTER — Inpatient Hospital Stay: Payer: Medicare HMO | Attending: Hematology

## 2018-08-09 DIAGNOSIS — D500 Iron deficiency anemia secondary to blood loss (chronic): Secondary | ICD-10-CM | POA: Diagnosis not present

## 2018-08-09 DIAGNOSIS — Z79899 Other long term (current) drug therapy: Secondary | ICD-10-CM | POA: Insufficient documentation

## 2018-08-09 DIAGNOSIS — D860 Sarcoidosis of lung: Secondary | ICD-10-CM | POA: Diagnosis not present

## 2018-08-09 LAB — CBC WITH DIFFERENTIAL (CANCER CENTER ONLY)
Abs Immature Granulocytes: 0.09 10*3/uL — ABNORMAL HIGH (ref 0.00–0.07)
Basophils Absolute: 0 10*3/uL (ref 0.0–0.1)
Basophils Relative: 0 %
Eosinophils Absolute: 0 10*3/uL (ref 0.0–0.5)
Eosinophils Relative: 0 %
HCT: 37.5 % (ref 36.0–46.0)
Hemoglobin: 11.3 g/dL — ABNORMAL LOW (ref 12.0–15.0)
Immature Granulocytes: 1 %
Lymphocytes Relative: 4 %
Lymphs Abs: 0.5 10*3/uL — ABNORMAL LOW (ref 0.7–4.0)
MCH: 24.2 pg — AB (ref 26.0–34.0)
MCHC: 30.1 g/dL (ref 30.0–36.0)
MCV: 80.3 fL (ref 80.0–100.0)
MONO ABS: 0.2 10*3/uL (ref 0.1–1.0)
Monocytes Relative: 2 %
Neutro Abs: 10.4 10*3/uL — ABNORMAL HIGH (ref 1.7–7.7)
Neutrophils Relative %: 93 %
Platelet Count: 272 10*3/uL (ref 150–400)
RBC: 4.67 MIL/uL (ref 3.87–5.11)
RDW: 19.5 % — ABNORMAL HIGH (ref 11.5–15.5)
WBC: 11.1 10*3/uL — AB (ref 4.0–10.5)
nRBC: 0 % (ref 0.0–0.2)

## 2018-08-09 LAB — RETICULOCYTES
Immature Retic Fract: 18.7 % — ABNORMAL HIGH (ref 2.3–15.9)
RBC.: 4.67 MIL/uL (ref 3.87–5.11)
RETIC CT PCT: 2 % (ref 0.4–3.1)
Retic Count, Absolute: 91.1 10*3/uL (ref 19.0–186.0)

## 2018-08-09 LAB — IRON AND TIBC
Iron: 18 ug/dL — ABNORMAL LOW (ref 41–142)
SATURATION RATIOS: 5 % — AB (ref 21–57)
TIBC: 350 ug/dL (ref 236–444)
UIBC: 332 ug/dL

## 2018-08-09 LAB — FERRITIN: Ferritin: 27 ng/mL (ref 11–307)

## 2018-08-10 ENCOUNTER — Telehealth: Payer: Self-pay

## 2018-08-10 DIAGNOSIS — E669 Obesity, unspecified: Secondary | ICD-10-CM | POA: Diagnosis not present

## 2018-08-10 DIAGNOSIS — G4733 Obstructive sleep apnea (adult) (pediatric): Secondary | ICD-10-CM | POA: Diagnosis not present

## 2018-08-10 NOTE — Telephone Encounter (Signed)
Lyda Jester PA note re: pt preop clearance refaxed to [place]... [phone].

## 2018-08-13 ENCOUNTER — Telehealth: Payer: Self-pay

## 2018-08-13 NOTE — Telephone Encounter (Signed)
Spoke with patient with lab results.  Informed her iron level is low, Dr. Burr Medico would like to schedule her for IV iron infusions x 2, patient is requested first one be this Thursday if possible.  Scheduling message was sent.

## 2018-08-13 NOTE — Telephone Encounter (Signed)
-----   Message from Truitt Merle, MD sent at 08/12/2018  2:12 PM EDT ----- Please let pt know her iron level know, I will send schedule message for iv iron X2, thanks   Truitt Merle  08/12/2018

## 2018-08-14 ENCOUNTER — Telehealth: Payer: Self-pay | Admitting: Hematology

## 2018-08-14 NOTE — Telephone Encounter (Signed)
Appts scheduled and I spoke with patient she was given dates/times per 10/14 sch msg

## 2018-08-16 ENCOUNTER — Inpatient Hospital Stay: Payer: Medicare HMO

## 2018-08-16 ENCOUNTER — Other Ambulatory Visit: Payer: Medicare HMO

## 2018-08-16 VITALS — BP 154/85 | HR 78 | Temp 98.6°F | Resp 18

## 2018-08-16 DIAGNOSIS — Z79899 Other long term (current) drug therapy: Secondary | ICD-10-CM | POA: Diagnosis not present

## 2018-08-16 DIAGNOSIS — D500 Iron deficiency anemia secondary to blood loss (chronic): Secondary | ICD-10-CM

## 2018-08-16 MED ORDER — SODIUM CHLORIDE 0.9 % IV SOLN
510.0000 mg | Freq: Once | INTRAVENOUS | Status: AC
  Administered 2018-08-16: 510 mg via INTRAVENOUS
  Filled 2018-08-16: qty 17

## 2018-08-16 MED ORDER — SODIUM CHLORIDE 0.9 % IV SOLN
INTRAVENOUS | Status: DC
  Administered 2018-08-16: 10:00:00 via INTRAVENOUS
  Filled 2018-08-16: qty 250

## 2018-08-16 NOTE — Patient Instructions (Signed)

## 2018-08-23 ENCOUNTER — Inpatient Hospital Stay: Payer: Medicare HMO

## 2018-08-23 ENCOUNTER — Other Ambulatory Visit (INDEPENDENT_AMBULATORY_CARE_PROVIDER_SITE_OTHER): Payer: Medicare HMO

## 2018-08-23 VITALS — BP 143/83 | HR 65 | Temp 97.9°F | Resp 16

## 2018-08-23 DIAGNOSIS — Z79899 Other long term (current) drug therapy: Secondary | ICD-10-CM | POA: Diagnosis not present

## 2018-08-23 DIAGNOSIS — E039 Hypothyroidism, unspecified: Secondary | ICD-10-CM

## 2018-08-23 DIAGNOSIS — D500 Iron deficiency anemia secondary to blood loss (chronic): Secondary | ICD-10-CM

## 2018-08-23 LAB — TSH: TSH: 1.99 u[IU]/mL (ref 0.35–4.50)

## 2018-08-23 MED ORDER — SODIUM CHLORIDE 0.9 % IV SOLN
510.0000 mg | Freq: Once | INTRAVENOUS | Status: AC
  Administered 2018-08-23: 510 mg via INTRAVENOUS
  Filled 2018-08-23: qty 17

## 2018-08-23 MED ORDER — SODIUM CHLORIDE 0.9 % IV SOLN
Freq: Once | INTRAVENOUS | Status: AC
  Administered 2018-08-23: 10:00:00 via INTRAVENOUS
  Filled 2018-08-23: qty 250

## 2018-08-23 NOTE — Addendum Note (Signed)
Addended by: Rene Kocher on: 08/23/2018 11:58 AM   Modules accepted: Orders

## 2018-08-23 NOTE — Progress Notes (Signed)
Pt refused to stay 30 minute obv period. VSS (see flowsheet). No hx of reaction; pt has triage number and knows we are here for her if she needs [place].

## 2018-08-23 NOTE — Patient Instructions (Signed)

## 2018-08-25 DIAGNOSIS — D869 Sarcoidosis, unspecified: Secondary | ICD-10-CM | POA: Diagnosis not present

## 2018-08-25 DIAGNOSIS — R918 Other nonspecific abnormal finding of lung field: Secondary | ICD-10-CM | POA: Diagnosis not present

## 2018-08-25 DIAGNOSIS — R0602 Shortness of breath: Secondary | ICD-10-CM | POA: Diagnosis not present

## 2018-08-25 DIAGNOSIS — E662 Morbid (severe) obesity with alveolar hypoventilation: Secondary | ICD-10-CM | POA: Diagnosis not present

## 2018-08-25 DIAGNOSIS — E669 Obesity, unspecified: Secondary | ICD-10-CM | POA: Diagnosis not present

## 2018-08-25 DIAGNOSIS — G4733 Obstructive sleep apnea (adult) (pediatric): Secondary | ICD-10-CM | POA: Diagnosis not present

## 2018-08-29 ENCOUNTER — Other Ambulatory Visit: Payer: Self-pay | Admitting: Adult Health

## 2018-09-03 DIAGNOSIS — R69 Illness, unspecified: Secondary | ICD-10-CM | POA: Diagnosis not present

## 2018-09-07 ENCOUNTER — Other Ambulatory Visit: Payer: Self-pay | Admitting: *Deleted

## 2018-09-07 MED ORDER — LEVOTHYROXINE SODIUM 100 MCG PO TABS: 100.0000 ug | ORAL_TABLET | Freq: Every day | ORAL | 0 refills | Status: DC

## 2018-09-11 DIAGNOSIS — G4733 Obstructive sleep apnea (adult) (pediatric): Secondary | ICD-10-CM | POA: Diagnosis not present

## 2018-09-14 ENCOUNTER — Other Ambulatory Visit: Payer: Self-pay | Admitting: Adult Health

## 2018-09-25 DIAGNOSIS — D869 Sarcoidosis, unspecified: Secondary | ICD-10-CM | POA: Diagnosis not present

## 2018-09-25 DIAGNOSIS — R918 Other nonspecific abnormal finding of lung field: Secondary | ICD-10-CM | POA: Diagnosis not present

## 2018-09-25 DIAGNOSIS — E662 Morbid (severe) obesity with alveolar hypoventilation: Secondary | ICD-10-CM | POA: Diagnosis not present

## 2018-09-25 DIAGNOSIS — R0602 Shortness of breath: Secondary | ICD-10-CM | POA: Diagnosis not present

## 2018-09-25 DIAGNOSIS — E669 Obesity, unspecified: Secondary | ICD-10-CM | POA: Diagnosis not present

## 2018-09-25 DIAGNOSIS — G4733 Obstructive sleep apnea (adult) (pediatric): Secondary | ICD-10-CM | POA: Diagnosis not present

## 2018-10-01 DIAGNOSIS — R69 Illness, unspecified: Secondary | ICD-10-CM | POA: Diagnosis not present

## 2018-10-13 ENCOUNTER — Other Ambulatory Visit: Payer: Self-pay | Admitting: Adult Health

## 2018-10-15 NOTE — Telephone Encounter (Signed)
30 days.

## 2018-10-15 NOTE — Telephone Encounter (Signed)
Sent to the pharmacy by e-scribe for 30 days. 

## 2018-10-15 NOTE — Telephone Encounter (Signed)
Please advise 

## 2018-10-25 ENCOUNTER — Telehealth: Payer: Self-pay | Admitting: Pulmonary Disease

## 2018-10-25 DIAGNOSIS — R0602 Shortness of breath: Secondary | ICD-10-CM | POA: Diagnosis not present

## 2018-10-25 DIAGNOSIS — Z6841 Body Mass Index (BMI) 40.0 and over, adult: Secondary | ICD-10-CM | POA: Diagnosis not present

## 2018-10-25 DIAGNOSIS — E662 Morbid (severe) obesity with alveolar hypoventilation: Secondary | ICD-10-CM | POA: Diagnosis not present

## 2018-10-25 DIAGNOSIS — D869 Sarcoidosis, unspecified: Secondary | ICD-10-CM | POA: Diagnosis not present

## 2018-10-25 DIAGNOSIS — Z79899 Other long term (current) drug therapy: Secondary | ICD-10-CM | POA: Diagnosis not present

## 2018-10-25 DIAGNOSIS — M064 Inflammatory polyarthropathy: Secondary | ICD-10-CM | POA: Diagnosis not present

## 2018-10-25 DIAGNOSIS — D860 Sarcoidosis of lung: Secondary | ICD-10-CM | POA: Diagnosis not present

## 2018-10-25 DIAGNOSIS — R918 Other nonspecific abnormal finding of lung field: Secondary | ICD-10-CM | POA: Diagnosis not present

## 2018-10-25 DIAGNOSIS — E669 Obesity, unspecified: Secondary | ICD-10-CM | POA: Diagnosis not present

## 2018-10-25 DIAGNOSIS — G4733 Obstructive sleep apnea (adult) (pediatric): Secondary | ICD-10-CM | POA: Diagnosis not present

## 2018-10-25 DIAGNOSIS — M159 Polyosteoarthritis, unspecified: Secondary | ICD-10-CM | POA: Diagnosis not present

## 2018-10-25 NOTE — Telephone Encounter (Signed)
Called and spoke with pt who states within about 3 week she has gained 19 pounds. Pt stated when she noticed the wt gain, she did increase her lasix but stated this did not help with the wt gain.   Pt states when she walks, she does notice that she has to stop to get her breath which she is having to do more often now. Pt stated this has happened before. I advised pt that we should get her to come in for an appt to further evaluate what is going on. Pt expressed understanding. Scheduled OV for pt tomorrow, 12/27 at 4pm with Lazaro Arms. I did make sure pt had our new office address. Nothing further needed.

## 2018-10-26 ENCOUNTER — Encounter: Payer: Self-pay | Admitting: Nurse Practitioner

## 2018-10-26 ENCOUNTER — Other Ambulatory Visit: Payer: Self-pay

## 2018-10-26 ENCOUNTER — Ambulatory Visit (INDEPENDENT_AMBULATORY_CARE_PROVIDER_SITE_OTHER): Payer: Medicare HMO | Admitting: Nurse Practitioner

## 2018-10-26 ENCOUNTER — Emergency Department (HOSPITAL_COMMUNITY): Payer: Medicare HMO

## 2018-10-26 ENCOUNTER — Encounter (HOSPITAL_COMMUNITY): Payer: Self-pay

## 2018-10-26 ENCOUNTER — Inpatient Hospital Stay (HOSPITAL_COMMUNITY)
Admission: EM | Admit: 2018-10-26 | Discharge: 2018-10-29 | DRG: 291 | Disposition: A | Discharge status: Home / Self Care | Payer: Medicare HMO | Attending: Internal Medicine | Admitting: Internal Medicine

## 2018-10-26 VITALS — BP 134/88 | HR 79 | Ht 65.0 in | Wt >= 6400 oz

## 2018-10-26 DIAGNOSIS — Z6841 Body Mass Index (BMI) 40.0 and over, adult: Secondary | ICD-10-CM

## 2018-10-26 DIAGNOSIS — I5033 Acute on chronic diastolic (congestive) heart failure: Secondary | ICD-10-CM

## 2018-10-26 DIAGNOSIS — J449 Chronic obstructive pulmonary disease, unspecified: Secondary | ICD-10-CM | POA: Diagnosis present

## 2018-10-26 DIAGNOSIS — D649 Anemia, unspecified: Secondary | ICD-10-CM | POA: Diagnosis not present

## 2018-10-26 DIAGNOSIS — J9621 Acute and chronic respiratory failure with hypoxia: Secondary | ICD-10-CM | POA: Diagnosis present

## 2018-10-26 DIAGNOSIS — D869 Sarcoidosis, unspecified: Secondary | ICD-10-CM | POA: Diagnosis not present

## 2018-10-26 DIAGNOSIS — K219 Gastro-esophageal reflux disease without esophagitis: Secondary | ICD-10-CM | POA: Diagnosis present

## 2018-10-26 DIAGNOSIS — R0902 Hypoxemia: Secondary | ICD-10-CM | POA: Diagnosis not present

## 2018-10-26 DIAGNOSIS — M069 Rheumatoid arthritis, unspecified: Secondary | ICD-10-CM | POA: Diagnosis not present

## 2018-10-26 DIAGNOSIS — R0689 Other abnormalities of breathing: Secondary | ICD-10-CM | POA: Diagnosis not present

## 2018-10-26 DIAGNOSIS — Z79899 Other long term (current) drug therapy: Secondary | ICD-10-CM | POA: Diagnosis not present

## 2018-10-26 DIAGNOSIS — J9611 Chronic respiratory failure with hypoxia: Secondary | ICD-10-CM

## 2018-10-26 DIAGNOSIS — E039 Hypothyroidism, unspecified: Secondary | ICD-10-CM | POA: Diagnosis not present

## 2018-10-26 DIAGNOSIS — Z9981 Dependence on supplemental oxygen: Secondary | ICD-10-CM | POA: Diagnosis not present

## 2018-10-26 DIAGNOSIS — R000 Tachycardia, unspecified: Secondary | ICD-10-CM | POA: Diagnosis not present

## 2018-10-26 DIAGNOSIS — I110 Hypertensive heart disease with heart failure: Principal | ICD-10-CM | POA: Diagnosis present

## 2018-10-26 DIAGNOSIS — I509 Heart failure, unspecified: Secondary | ICD-10-CM

## 2018-10-26 DIAGNOSIS — I10 Essential (primary) hypertension: Secondary | ICD-10-CM | POA: Diagnosis not present

## 2018-10-26 DIAGNOSIS — E662 Morbid (severe) obesity with alveolar hypoventilation: Secondary | ICD-10-CM | POA: Diagnosis present

## 2018-10-26 DIAGNOSIS — E038 Other specified hypothyroidism: Secondary | ICD-10-CM | POA: Diagnosis not present

## 2018-10-26 DIAGNOSIS — R0602 Shortness of breath: Secondary | ICD-10-CM | POA: Diagnosis not present

## 2018-10-26 LAB — CBC
HCT: 39.8 % (ref 36.0–46.0)
Hemoglobin: 11.8 g/dL — ABNORMAL LOW (ref 12.0–15.0)
MCH: 24 pg — AB (ref 26.0–34.0)
MCHC: 29.6 g/dL — ABNORMAL LOW (ref 30.0–36.0)
MCV: 80.9 fL (ref 80.0–100.0)
Platelets: 311 10*3/uL (ref 150–400)
RBC: 4.92 MIL/uL (ref 3.87–5.11)
RDW: 20.4 % — ABNORMAL HIGH (ref 11.5–15.5)
WBC: 12.9 10*3/uL — ABNORMAL HIGH (ref 4.0–10.5)
nRBC: 0 % (ref 0.0–0.2)

## 2018-10-26 LAB — I-STAT BETA HCG BLOOD, ED (MC, WL, AP ONLY): I-stat hCG, quantitative: <5 m[IU]/mL (ref <5)

## 2018-10-26 LAB — I-STAT TROPONIN, ED: Troponin i, poc: 0.01 ng/mL (ref 0.00–0.08)

## 2018-10-26 MED ORDER — FUROSEMIDE 10 MG/ML IJ SOLN
80.0000 mg | Freq: Once | INTRAMUSCULAR | Status: AC
  Administered 2018-10-27: 80 mg via INTRAVENOUS
  Filled 2018-10-26: qty 8

## 2018-10-26 NOTE — ED Notes (Addendum)
Lab at bedside

## 2018-10-26 NOTE — Assessment & Plan Note (Signed)
Patient Instructions  Considering symptoms - Advised to go to the hospital - may need IV diuresis - patient agrees May continue O2 at 2 L [place] with exertion as needed to keep O2 sats above 88% Low sodium diet Continue current medications Follow up with Dr. Elsworth Soho after discharge

## 2018-10-26 NOTE — ED Notes (Signed)
Bed: BW46 Expected date:  Expected time:  Means of arrival:  Comments: EMS 52 yo female hx sarcoidosis-saw primary today-told to come to ED for diuresis

## 2018-10-26 NOTE — Assessment & Plan Note (Signed)
Patient Instructions  Considering symptoms - Advised to go to the hospital - may need IV diuresis - patient agrees May continue O2 at 2 L [place] with exertion as needed to keep O2 sats above 88% Low sodium diet Continue current medications Follow up with Dr. Elsworth Soho after discharge

## 2018-10-26 NOTE — ED Provider Notes (Signed)
[facility] DEPT Provider Note   CSN: [id number] Arrival date & time: 10/26/18  2157     History   Chief Complaint Chief Complaint  Patient presents with  . Shortness of Breath    HPI [name] is a 52 y.o. female.  HPI  52 year old female with a complex past medical history as below including CHF, COPD, sarcoidosis, here with shortness of breath.  The patient states that over the last 3 weeks, she has gained approximately 19 to 20 pounds of fluid weight.  She noticed increased swelling in her legs along with progressively worsening dyspnea with exertion.  She has been increasing her home Lasix from 40 once a day, to 40 twice a day, and now 60 twice a day, without any significant relief.  She was seen at her pulmonologist today and sent here for likely admission.  She endorses that over the last week, she has had worsening chest pressure and shortness of breath with even small amounts of exertion, and is noticing significantly more shortness of breath when lying flat.  She is taking longer to recover as well after exertion.  No fevers or chills.  No sputum production.  She does admit to dietary indiscretion over the holidays.  No alleviating factors other than rest and sitting upright.  Past Medical History:  Diagnosis Date  . Anemia   . Arthritis    hands, shoulders, no meds  . CHF (congestive heart failure) (HCC)    EF60-65%  . Chronic hyperventilation syndrome    w/ obesity tx with albuterol inhaler and oxygen 2L  . COPD (chronic obstructive pulmonary disease) ([place])    uses oxygen 2 L  . Gallstones   . GERD (gastroesophageal reflux disease)    diet controlled - no meds  . H/O hiatal hernia   . Hypertension   . Hypothyroidism   . Kidney stones   . Morbid obesity ([place])   . Pneumonia    hoispitalized in 08/2011  . Sarcoidosis   . Seasonal allergies   . Sleep apnea    uses CPAP machine     Patient Active Problem List    Diagnosis Date Noted  . CHF exacerbation ([place]) 04/17/2018  . Diastolic CHF ([place]) 36/62/9476  . CHF (congestive heart failure) ([place]) 01/10/2018  . Unilateral primary osteoarthritis, left knee 02/20/2017  . Unilateral primary osteoarthritis, right knee 02/20/2017  . Chronic pain of right knee 02/20/2017  . COPD exacerbation ([place]) 02/12/2017  . GERD (gastroesophageal reflux disease) 02/12/2017  . Abnormal uterine bleeding 08/09/2016  . Diastolic dysfunction with acute on chronic heart failure ([place]) 06/17/2016  . Microcytic anemia 02/27/2016  . Abdominal pain 02/27/2016  . Chronic diastolic CHF (congestive heart failure) ([place]) 02/27/2016  . Hyperlipidemia 02/24/2016  . Chronic respiratory failure ([place]) 09/21/2015  . Absolute anemia   . Anxiety 04/14/2015  . SOB (shortness of breath)   . Acute on chronic diastolic CHF (congestive heart failure) ([place]) 01/18/2015  . Hypoxemia   . Congestive heart failure ([place])   . Symptomatic anemia 01/15/2015  . Hypothyroidism 01/15/2015  . Morbid obesity ([place]) 01/15/2015  . Fibroids 12/09/2013  . Hyperglycemia 09/16/2013  . Left knee pain 09/09/2012  . Sarcoidosis 09/09/2012  . Iron deficiency anemia due to chronic blood loss 09/09/2012  . Hypertension 02/25/2011  . Obesity hypoventilation syndrome ([place]) 02/14/2011  . OSA (obstructive sleep apnea) 02/10/2011    Past Surgical History:  Procedure Laterality Date  . [place]   x  1  . CHOLECYSTECTOMY  2000  . DILATION AND CURETTAGE OF UTERUS N/A 08/09/2016   Procedure: DILATATION AND CURETTAGE;  Surgeon: Everitt Amber, MD;  Location: [facility];  Service: Gynecology;  Laterality: N/A;  . HYSTEROSCOPY W/D&C  12/27/2011   Procedure: DILATATION AND CURETTAGE /HYSTEROSCOPY;  Surgeon: Maeola Sarah. Landry Mellow, MD;  Location: [facility];  Service: Gynecology;;  . I and D of abcess  05/2011  . INTRAUTERINE DEVICE (IUD) INSERTION N/A 08/09/2016   Procedure: INTRAUTERINE DEVICE (IUD) INSERTION;  Surgeon: Everitt Amber,  MD;  Location: [facility];  Service: Gynecology;  Laterality: N/A;  . LUNG BIOPSY    . uterine abletion       OB History   No obstetric history on file.      Home Medications    Prior to Admission medications   Medication Sig Start Date End Date Taking? Authorizing Provider  albuterol (PROAIR HFA) 108 (90 Base) MCG/ACT inhaler Inhale 1-2 puffs into the lungs every 6 hours as needed for wheezing. 01/08/18   Rigoberto Noel, MD  cetirizine (ZYRTEC) 10 MG tablet Take 10 mg by mouth at bedtime as needed for allergies.    [provider]  cholecalciferol (VITAMIN D) 400 UNITS TABS tablet Take 400 Units by mouth daily.    [provider]  Coral Calcium 1000 (390 CA) MG TABS Take 1,000 mg by mouth daily.     [provider]  Cyanocobalamin (VITAMIN B-12) 5000 MCG TBDP Take 1 tablet by mouth once a week.     [provider]  diclofenac sodium (VOLTAREN) 1 % GEL APPLY 1 APPLICATION 4 TIMES A DAY AS NEEDED PAIN 03/18/18   [provider]  diphenhydrAMINE (BENADRYL) 25 mg capsule Take 25 mg by mouth every 6 (six) hours as needed (fever).    [provider]  DUREZOL 0.05 % EMUL INSTILL 1 DROP INTO LEFT EYE 3 TIMES A DAY AS DIRECTED 08/01/18   [provider]  famotidine (PEPCID) 40 MG tablet Take 1 tablet (40 mg total) by mouth 2 (two) times daily. 02/16/17   Barton Dubois, MD  folic acid (FOLVITE) 1 MG tablet Take 1 mg by mouth daily. 01/11/16   [provider]  furosemide (LASIX) 20 MG tablet Take 3 tablets by mouth twice daily.  **NEEDS PHYSICAL APPT WITH [facility]** 10/15/18   Nafziger, Tommi Rumps, NP  HYDROcodone-acetaminophen (NORCO) 10-325 MG tablet Take 1 tablet by mouth every 6 (six) hours as needed (pain). 11/21/16   Pete Pelt, PA-C  ketorolac (ACULAR) 0.5 % ophthalmic solution  08/23/18   [provider]  levothyroxine (SYNTHROID, LEVOTHROID) 100 MCG tablet Take 1 tablet (100 mcg total) by mouth daily at 6 (six) AM. 09/07/18    Nafziger, Tommi Rumps, NP  losartan (COZAAR) 100 MG tablet TAKE 1 TABLET (100 MG TOTAL) DAILY BY MOUTH. 05/31/18   Nafziger, Tommi Rumps, NP  methotrexate 250 MG/10ML injection INJECT 0.8CC ONCE A WEEK INJECTION 30 DAYS 03/20/18   [provider]  metoprolol succinate (TOPROL-XL) 25 MG 24 hr tablet Take 1 tablet (25 mg total) by mouth daily. Needs physical for future refills. 09/14/18   Nafziger, Tommi Rumps, NP  moxifloxacin (VIGAMOX) 0.5 % ophthalmic solution INSTILL 1 DROP INTO LEFT EYE 4 TIMES A DAY AS DIRECTED 08/01/18   [provider]  NON FORMULARY 2 liter of oxygen    [provider]  predniSONE (DELTASONE) 10 MG tablet Take 1 tablet (10 mg total) by mouth daily. 01/17/18   Florencia Reasons, MD  triamcinolone acetonide (KENALOG) 40 MG/ML injection Inject 40 mg into the muscle every 3 (three) months. For knee pain    [provider]  triamcinolone ointment (KENALOG) 0.1 % Apply 1 application topically See admin instructions. Applies twice a day as needed for break out of Sarcoidosis    [provider]  vitamin B-12 1000 MCG tablet Take 1 tablet (1,000 mcg total) by mouth daily. 06/19/16   Lavina Hamman, MD    Family History Family History  Problem Relation Age of Onset  . Diabetes Father   . Cancer Father 79       colon cancer   . Diabetes Brother   . Deep vein thrombosis Mother   . Aneurysm Sister        d/o brain aneurysm    Social History Social History   Tobacco Use  . Smoking status: Never Smoker  . Smokeless tobacco: Never Used  Substance Use Topics  . Alcohol use: Yes    Comment: occasionally  . Drug use: No     Allergies   Patient has no known allergies.   Review of Systems Review of Systems  Constitutional: Positive for fatigue. Negative for chills and fever.  HENT: Negative for congestion and rhinorrhea.   Eyes: Negative for visual disturbance.  Respiratory: Positive for cough and shortness of breath. Negative for wheezing.    Cardiovascular: Positive for leg swelling. Negative for chest pain.  Gastrointestinal: Negative for abdominal pain, diarrhea, nausea and vomiting.  Genitourinary: Negative for dysuria and flank pain.  Musculoskeletal: Negative for neck pain and neck stiffness.  Skin: Negative for rash and wound.  Allergic/Immunologic: Negative for immunocompromised state.  Neurological: Negative for syncope, weakness and headaches.  All other systems reviewed and are negative.    Physical Exam Updated Vital Signs BP (!) 154/119 (BP Location: Left Arm)   Pulse 83   Temp 99.2 F (37.3 C) (Oral)   Resp (!) 25   Ht 5' 5.5" (1.664 m)   Wt (!) 231.8 kg   LMP 10/05/2018 (Approximate)   SpO2 96%   BMI 83.74 kg/m   Physical Exam Vitals signs and nursing note reviewed.  Constitutional:      General: She is not in acute distress.    Appearance: She is well-developed. She is obese.  HENT:     Head: Normocephalic and atraumatic.  Eyes:     Conjunctiva/sclera: Conjunctivae normal.  Neck:     Musculoskeletal: Neck supple.  Cardiovascular:     Rate and Rhythm: Normal rate and regular rhythm.     Heart sounds: Normal heart sounds. No murmur. No friction rub.  Pulmonary:     Effort: Pulmonary effort is normal. No respiratory distress.     Breath sounds: Examination of the right-lower field reveals rales. Examination of the left-lower field reveals rales. Rales present. No wheezing.  Abdominal:     General: There is no distension.     Palpations: Abdomen is soft.     Tenderness: There is no abdominal tenderness.  Musculoskeletal:     Right lower leg: Edema (2+, pitting) present.     Left lower leg: Edema (2+, pitting) present.  Skin:    General: Skin is warm.     Capillary Refill: Capillary refill takes less than 2 seconds.  Neurological:     Mental Status: She is alert and oriented to person, place, and time.     Motor: No abnormal muscle tone.      Kim Treatments / Results  Labs (all labs  ordered are listed, but only abnormal results are displayed) Labs Reviewed  BASIC METABOLIC PANEL  CBC  TROPONIN I  BRAIN NATRIURETIC PEPTIDE  I-STAT TROPONIN, Kim  I-STAT BETA HCG BLOOD, Kim ([facility], [facility], [facility] ONLY)    EKG EKG Interpretation  Date/Time:  Friday October 26 2018 22:12:24 EST Ventricular Rate:  83 PR Interval:    QRS Duration: 92 QT Interval:  384 QTC Calculation: 452 R Axis:   67 Text Interpretation:  Sinus rhythm Probable left atrial enlargement Although rate has decreased since last tracing Confirmed by Pattricia Boss [phone]) on 10/26/2018 10:24:24 PM   Radiology Dg Chest 2 View  Result Date: 10/26/2018 CLINICAL DATA:  Shortness of breath EXAM: CHEST - 2 VIEW COMPARISON:  Chest CT 04/17/2018 FINDINGS: Hazy opacities within both lungs. Moderate cardiomegaly. No pleural effusion or pneumothorax. No focal consolidation. IMPRESSION: Cardiomegaly and mild pulmonary edema. Electronically Signed   By: Ulyses Jarred M.D.   On: 10/26/2018 22:26    Procedures Procedures (including critical care time)  Medications Ordered in Kim Medications - No data to display   Initial Impression / Assessment and Plan / Kim Course  I have reviewed the triage vital signs and the nursing notes.  Pertinent labs & imaging results that were available during my care of the patient were reviewed by me and considered in my medical decision making (see chart for details).     52 yo F with PMHx CHF, COPD, sarcoidosis here with 20 lb weight gain, orthopnea, LE edema. Suspect acute on chronic CHF exacerbation. Given her marked HTN, tachypnea, and failure of increased outpt lasix, suspect she will need admission. No signs of ACS. Sx are not c/w PE. Will start IV diuresis, check labs, and admit.  Final Clinical Impressions(s) / Kim Diagnoses   Final diagnoses:  Acute on chronic diastolic congestive heart failure [facility])    Kim Discharge Orders    None       Duffy Bruce, MD 10/27/18 [phone]

## 2018-10-26 NOTE — ED Triage Notes (Signed)
Patient presents by [facility]-morbidly obese female with dyspneic slight exertion. Patient saw primary today-hx chronic diastolic CHF and sarcoidosis-increased SOB at home with little to no activity-told to come to ED for further evaluation. Patient is alert and very pleasant with noticeable dyspnea with talking and any movement.

## 2018-10-26 NOTE — Progress Notes (Signed)
@Patient  ID: [name], female    DOB: [birth year], 52 y.o.   MRN: [id number]  No chief complaint on file.   Referring provider: [name], NP  HPI  52 year old female patient managed in our office for obstructive sleep apnea and chronic respiratory failure.  Presumed sarcoid.  Morbidly obese with a BMI of 78. Inflammatory arthritis with positive ANA and follows with rheumatology (August).  Maintained on 20 mg of injectable methotrexate. PMH includes CHF and hypertension  Tests/Significant events: 10/2014 PFTs-FVC 55%, DLCO 44%  Adm 12/2014 for acute resp failure secondary to CHF and PNA superimposed upon underlying OSA/OHS, sarcoidosis, and anemia -required 2 units PRBC for hemoglobin 6.7 CT angiogram chest negative for pulmonary embolus-Revealed scattered opacities bilateral  ADM 04/16/18 - respiratory failure due to CHF - aggressive IV diuresis   OV 10/26/18 - 20 pound weight gain Patient presents with recent 20 pound weight gain.  Has known history of congestive heart failure.  Symptoms started 3 weeks ago - did not want to go to the hospital because of the holidays.  He complains of increased shortness of breath. Cannot walk any distance without having to rest. Has needed O2 at 2 L [place]  continuously. Has been taking increased dose of lasix (60 mg BID) for over 1 week without any improvement.  She feels like she did when she was admitted to the hospital in June for the same issues.  She denies any chest pain or fever.  She does have bilateral peripheral edema.    No Known Allergies  Immunization History  Administered Date(s) Administered  . Influenza Split 08/22/2011  . Influenza Whole 07/31/2012  . Influenza,inj,Quad PF,6+ Mos 09/16/2013, 07/11/2014, 07/11/2015, 09/04/2017  . Influenza-Unspecified 08/18/2016  . Pneumococcal-Unspecified 08/01/2011    Past Medical History:  Diagnosis Date  . Anemia   . Arthritis    hands, shoulders, no meds  . CHF  (congestive heart failure) (HCC)    EF60-65%  . Chronic hyperventilation syndrome    w/ obesity tx with albuterol inhaler and oxygen 2L  . COPD (chronic obstructive pulmonary disease) ([place])    uses oxygen 2 L  . Gallstones   . GERD (gastroesophageal reflux disease)    diet controlled - no meds  . H/O hiatal hernia   . Hypertension   . Hypothyroidism   . Kidney stones   . Morbid obesity ([place])   . Pneumonia    hoispitalized in 08/2011  . Sarcoidosis   . Seasonal allergies   . Sleep apnea    uses CPAP machine     Tobacco History: Social History   Tobacco Use  Smoking Status Never Smoker  Smokeless Tobacco Never Used   Counseling given: Yes   Outpatient Encounter Medications as of 10/26/2018  Medication Sig  . albuterol (PROAIR HFA) 108 (90 Base) MCG/ACT inhaler Inhale 1-2 puffs into the lungs every 6 hours as needed for wheezing.  . cetirizine (ZYRTEC) 10 MG tablet Take 10 mg by mouth at bedtime as needed for allergies.  . cholecalciferol (VITAMIN D) 400 UNITS TABS tablet Take 400 Units by mouth daily.  Marland Kitchen Coral Calcium 1000 (390 CA) MG TABS Take 1,000 mg by mouth daily.   . Cyanocobalamin (VITAMIN B-12) 5000 MCG TBDP Take 1 tablet by mouth once a week.   . diclofenac sodium (VOLTAREN) 1 % GEL APPLY 1 APPLICATION 4 TIMES A DAY AS NEEDED PAIN  . diphenhydrAMINE (BENADRYL) 25 mg capsule Take 25 mg by mouth every 6 (six) hours as  needed (fever).  . famotidine (PEPCID) 40 MG tablet Take 1 tablet (40 mg total) by mouth 2 (two) times daily.  . folic acid (FOLVITE) 1 MG tablet Take 1 mg by mouth daily.  . furosemide (LASIX) 20 MG tablet Take 3 tablets by mouth twice daily.  **NEEDS PHYSICAL APPT WITH [facility]**  . HYDROcodone-acetaminophen (NORCO) 10-325 MG tablet Take 1 tablet by mouth every 6 (six) hours as needed (pain).  Marland Kitchen levothyroxine (SYNTHROID, LEVOTHROID) 100 MCG tablet Take 1 tablet (100 mcg total) by mouth daily at 6 (six) AM.  . losartan (COZAAR) 100 MG tablet TAKE 1 TABLET  (100 MG TOTAL) DAILY BY MOUTH.  . methotrexate 250 MG/10ML injection INJECT 0.8CC ONCE A WEEK INJECTION 30 DAYS  . metoprolol succinate (TOPROL-XL) 25 MG 24 hr tablet Take 1 tablet (25 mg total) by mouth daily. Needs physical for future refills.  . NON FORMULARY 2 liter of oxygen  . predniSONE (DELTASONE) 10 MG tablet Take 1 tablet (10 mg total) by mouth daily.  Marland Kitchen triamcinolone acetonide (KENALOG) 40 MG/ML injection Inject 40 mg into the muscle every 3 (three) months. For knee pain  . triamcinolone ointment (KENALOG) 0.1 % Apply 1 application topically See admin instructions. Applies twice a day as needed for break out of Sarcoidosis  . vitamin B-12 1000 MCG tablet Take 1 tablet (1,000 mcg total) by mouth daily.  . DUREZOL 0.05 % EMUL INSTILL 1 DROP INTO LEFT EYE 3 TIMES A DAY AS DIRECTED  . ketorolac (ACULAR) 0.5 % ophthalmic solution   . moxifloxacin (VIGAMOX) 0.5 % ophthalmic solution INSTILL 1 DROP INTO LEFT EYE 4 TIMES A DAY AS DIRECTED   No facility-administered encounter medications on file as of 10/26/2018.      Review of Systems  Review of Systems  Constitutional: Positive for activity change. Negative for chills and fever.  HENT: Negative.   Respiratory: Positive for shortness of breath. Negative for cough and wheezing.   Cardiovascular: Positive for leg swelling. Negative for chest pain and palpitations.  Gastrointestinal: Negative.   Allergic/Immunologic: Negative.   Neurological: Negative.   Psychiatric/Behavioral: Negative.        Physical Exam  BP 134/88 (BP Location: Left Arm, Patient Position: Sitting, Cuff Size: Normal)   Pulse 79   Ht 5\' 5"  (1.651 m)   Wt (!) 511 lb 9.6 oz (232.1 kg)   SpO2 97%   BMI 85.13 kg/m   Wt Readings from Last 5 Encounters:  10/26/18 (!) 511 lb 9.6 oz (232.1 kg)  05/17/18 (!) 477 lb (216.4 kg)  05/01/18 (!) 479 lb 12.8 oz (217.6 kg)  04/20/18 (!) 476 lb 3.2 oz (216 kg)  02/21/18 (!) 480 lb 12.8 oz (218.1 kg)     Physical  Exam Vitals signs and nursing note reviewed.  Constitutional:      General: She is not in acute distress.    Appearance: She is well-developed.  Cardiovascular:     Rate and Rhythm: Normal rate and regular rhythm.  Pulmonary:     Effort: Pulmonary effort is normal.     Breath sounds: Decreased breath sounds present.  Musculoskeletal:        General: Swelling (pitting edema bilateral) present.  Neurological:     Mental Status: She is alert and oriented to person, place, and time.      Assessment & Plan:   Acute on chronic diastolic CHF (congestive heart failure) ([place]) Patient Instructions  Considering symptoms - Advised to go to the hospital - may need  IV diuresis - patient agrees May continue O2 at 2 L [place] with exertion as needed to keep O2 sats above 88% Low sodium diet Continue current medications Follow up with Dr. Elsworth Soho after discharge    Chronic respiratory failure [facility]) Patient Instructions  Considering symptoms - Advised to go to the hospital - may need IV diuresis - patient agrees May continue O2 at 2 L [place] with exertion as needed to keep O2 sats above 88% Low sodium diet Continue current medications Follow up with Dr. Elsworth Soho after discharge       Fenton Foy, NP 10/26/2018

## 2018-10-26 NOTE — Patient Instructions (Addendum)
Considering symptoms - Advised to go to the hospital - may need IV diuresis - patient agrees May continue O2 at 2 L [place] with exertion as needed to keep O2 sats above 88% Low sodium diet Continue current medications Follow up with Dr. Elsworth Soho after discharge

## 2018-10-27 ENCOUNTER — Encounter (HOSPITAL_COMMUNITY): Payer: Self-pay | Admitting: Internal Medicine

## 2018-10-27 DIAGNOSIS — I5033 Acute on chronic diastolic (congestive) heart failure: Secondary | ICD-10-CM

## 2018-10-27 DIAGNOSIS — I509 Heart failure, unspecified: Secondary | ICD-10-CM

## 2018-10-27 DIAGNOSIS — I10 Essential (primary) hypertension: Secondary | ICD-10-CM

## 2018-10-27 LAB — MAGNESIUM: Magnesium: 1.6 mg/dL — ABNORMAL LOW (ref 1.7–2.4)

## 2018-10-27 LAB — COMPREHENSIVE METABOLIC PANEL
ALK PHOS: 34 U/L — AB (ref 38–126)
ALT: 10 U/L (ref 0–44)
AST: 11 U/L — ABNORMAL LOW (ref 15–41)
Albumin: 3.7 g/dL (ref 3.5–5.0)
Anion gap: 11 (ref 5–15)
BUN: 17 mg/dL (ref 6–20)
CALCIUM: 8.8 mg/dL — AB (ref 8.9–10.3)
CO2: 28 mmol/L (ref 22–32)
CREATININE: 0.82 mg/dL (ref 0.44–1.00)
Chloride: 101 mmol/L (ref 98–111)
GFR calc Af Amer: >60 mL/min (ref >60)
GFR calc non Af Amer: >60 mL/min (ref >60)
Glucose, Bld: 106 mg/dL — ABNORMAL HIGH (ref 70–99)
Potassium: 3.7 mmol/L (ref 3.5–5.1)
Sodium: 140 mmol/L (ref 135–145)
Total Bilirubin: 0.6 mg/dL (ref 0.3–1.2)
Total Protein: 6.8 g/dL (ref 6.5–8.1)

## 2018-10-27 LAB — CBC WITH DIFFERENTIAL/PLATELET
Abs Immature Granulocytes: 0.16 10*3/uL — ABNORMAL HIGH (ref 0.00–0.07)
BASOS ABS: 0 10*3/uL (ref 0.0–0.1)
Basophils Relative: 0 %
Eosinophils Absolute: 0.1 10*3/uL (ref 0.0–0.5)
Eosinophils Relative: 1 %
HCT: 37.1 % (ref 36.0–46.0)
Hemoglobin: 10.9 g/dL — ABNORMAL LOW (ref 12.0–15.0)
Immature Granulocytes: 1 %
Lymphocytes Relative: 11 %
Lymphs Abs: 1.3 10*3/uL (ref 0.7–4.0)
MCH: 24.1 pg — ABNORMAL LOW (ref 26.0–34.0)
MCHC: 29.4 g/dL — AB (ref 30.0–36.0)
MCV: 82.1 fL (ref 80.0–100.0)
Monocytes Absolute: 1.2 10*3/uL — ABNORMAL HIGH (ref 0.1–1.0)
Monocytes Relative: 10 %
NRBC: 0 % (ref 0.0–0.2)
Neutro Abs: 9.4 10*3/uL — ABNORMAL HIGH (ref 1.7–7.7)
Neutrophils Relative %: 77 %
Platelets: 229 10*3/uL (ref 150–400)
RBC: 4.52 MIL/uL (ref 3.87–5.11)
RDW: 20.4 % — ABNORMAL HIGH (ref 11.5–15.5)
WBC: 12.2 10*3/uL — ABNORMAL HIGH (ref 4.0–10.5)

## 2018-10-27 LAB — BASIC METABOLIC PANEL
ANION GAP: 10 (ref 5–15)
BUN: 16 mg/dL (ref 6–20)
CO2: 23 mmol/L (ref 22–32)
Calcium: 8.4 mg/dL — ABNORMAL LOW (ref 8.9–10.3)
Chloride: 105 mmol/L (ref 98–111)
Creatinine, Ser: 0.73 mg/dL (ref 0.44–1.00)
GFR calc Af Amer: >60 mL/min (ref >60)
GFR calc non Af Amer: >60 mL/min (ref >60)
GLUCOSE: 119 mg/dL — AB (ref 70–99)
Potassium: 4.1 mmol/L (ref 3.5–5.1)
Sodium: 138 mmol/L (ref 135–145)

## 2018-10-27 LAB — TSH: TSH: 2.967 u[IU]/mL (ref 0.350–4.500)

## 2018-10-27 LAB — BRAIN NATRIURETIC PEPTIDE: B Natriuretic Peptide: 52.8 pg/mL (ref 0.0–100.0)

## 2018-10-27 LAB — HIV ANTIBODY (ROUTINE TESTING W REFLEX): HIV SCREEN 4TH GENERATION: NONREACTIVE

## 2018-10-27 LAB — TROPONIN I
Troponin I: <0.03 ng/mL (ref <0.03)
Troponin I: <0.03 ng/mL (ref <0.03)

## 2018-10-27 MED ORDER — METOPROLOL SUCCINATE ER 25 MG PO TB24
25.0000 mg | ORAL_TABLET | Freq: Every day | ORAL | Status: DC
  Administered 2018-10-27 – 2018-10-29 (×3): 25 mg via ORAL
  Filled 2018-10-27 (×3): qty 1

## 2018-10-27 MED ORDER — ENOXAPARIN SODIUM 60 MG/0.6ML ~~LOC~~ SOLN
60.0000 mg | Freq: Once | SUBCUTANEOUS | Status: AC
  Administered 2018-10-27: 60 mg via SUBCUTANEOUS
  Filled 2018-10-27: qty 0.6

## 2018-10-27 MED ORDER — PREDNISONE 5 MG PO TABS
10.0000 mg | ORAL_TABLET | Freq: Every day | ORAL | Status: DC
  Administered 2018-10-27 – 2018-10-29 (×3): 10 mg via ORAL
  Filled 2018-10-27 (×3): qty 2

## 2018-10-27 MED ORDER — CALCIUM CARBONATE 1250 (500 CA) MG PO TABS
1250.0000 mg | ORAL_TABLET | Freq: Every day | ORAL | Status: DC
  Administered 2018-10-27 – 2018-10-29 (×3): 1250 mg via ORAL
  Filled 2018-10-27 (×3): qty 1

## 2018-10-27 MED ORDER — ACETAMINOPHEN 325 MG PO TABS: 650.0000 mg | ORAL_TABLET | Freq: Four times a day (QID) | ORAL | Status: DC | PRN

## 2018-10-27 MED ORDER — FAMOTIDINE 20 MG PO TABS
40.0000 mg | ORAL_TABLET | Freq: Two times a day (BID) | ORAL | Status: DC
  Administered 2018-10-27 – 2018-10-29 (×5): 40 mg via ORAL
  Filled 2018-10-27 (×5): qty 2

## 2018-10-27 MED ORDER — FOLIC ACID 1 MG PO TABS
1.0000 mg | ORAL_TABLET | Freq: Every day | ORAL | Status: DC
  Administered 2018-10-27 – 2018-10-29 (×3): 1 mg via ORAL
  Filled 2018-10-27 (×3): qty 1

## 2018-10-27 MED ORDER — HYDROCODONE-ACETAMINOPHEN 10-325 MG PO TABS: 1.0000 | ORAL_TABLET | Freq: Four times a day (QID) | ORAL | Status: DC | PRN

## 2018-10-27 MED ORDER — VITAMIN B-12 1000 MCG PO TABS
1000.0000 ug | ORAL_TABLET | Freq: Every day | ORAL | Status: DC
  Administered 2018-10-27 – 2018-10-29 (×3): 1000 ug via ORAL
  Filled 2018-10-27 (×3): qty 1

## 2018-10-27 MED ORDER — CHOLECALCIFEROL 10 MCG (400 UNIT) PO TABS
400.0000 [IU] | ORAL_TABLET | Freq: Every day | ORAL | Status: DC
  Administered 2018-10-27 – 2018-10-29 (×3): 400 [IU] via ORAL
  Filled 2018-10-27 (×3): qty 1

## 2018-10-27 MED ORDER — ENOXAPARIN SODIUM 40 MG/0.4ML ~~LOC~~ SOLN
40.0000 mg | Freq: Every day | SUBCUTANEOUS | Status: DC
  Administered 2018-10-27: 40 mg via SUBCUTANEOUS
  Filled 2018-10-27: qty 0.4

## 2018-10-27 MED ORDER — ACETAMINOPHEN 650 MG RE SUPP: 650.0000 mg | Freq: Four times a day (QID) | RECTAL | Status: DC | PRN

## 2018-10-27 MED ORDER — ONDANSETRON HCL 4 MG/2ML IJ SOLN: 4.0000 mg | Freq: Four times a day (QID) | INTRAMUSCULAR | Status: DC | PRN

## 2018-10-27 MED ORDER — ALBUTEROL SULFATE (2.5 MG/3ML) 0.083% IN NEBU: 3.0000 mL | INHALATION_SOLUTION | Freq: Four times a day (QID) | RESPIRATORY_TRACT | Status: DC | PRN

## 2018-10-27 MED ORDER — LOSARTAN POTASSIUM 50 MG PO TABS
100.0000 mg | ORAL_TABLET | Freq: Every day | ORAL | Status: DC
  Administered 2018-10-27 – 2018-10-29 (×3): 100 mg via ORAL
  Filled 2018-10-27 (×3): qty 2

## 2018-10-27 MED ORDER — ENOXAPARIN SODIUM 100 MG/ML ~~LOC~~ SOLN
100.0000 mg | SUBCUTANEOUS | Status: DC
  Administered 2018-10-28: 100 mg via SUBCUTANEOUS
  Filled 2018-10-27: qty 1

## 2018-10-27 MED ORDER — DICLOFENAC SODIUM 1 % TD GEL: 2.0000 g | Freq: Every day | TRANSDERMAL | Status: DC | PRN

## 2018-10-27 MED ORDER — FUROSEMIDE 10 MG/ML IJ SOLN
80.0000 mg | Freq: Two times a day (BID) | INTRAMUSCULAR | Status: DC
  Administered 2018-10-27 – 2018-10-29 (×5): 80 mg via INTRAVENOUS
  Filled 2018-10-27 (×5): qty 8

## 2018-10-27 MED ORDER — ONDANSETRON HCL 4 MG PO TABS: 4.0000 mg | ORAL_TABLET | Freq: Four times a day (QID) | ORAL | Status: DC | PRN

## 2018-10-27 NOTE — ED Notes (Signed)
Bedside commode at bedside. Pt refused pure-wick, bedpan, and female urinal.

## 2018-10-27 NOTE — ED Notes (Signed)
ED TO INPATIENT HANDOFF REPORT  Name/Age/Gender [name] 52 y.o. female  Code Status Code Status History    Date Active Date Inactive Code Status Order ID Comments User Context   04/17/2018 0212 04/20/2018 1613 Full Code [id number]  Norval Morton, MD ED   01/10/2018 1616 01/17/2018 1640 Full Code [id number]  Hosie Poisson, MD Inpatient   02/12/2017 0139 02/16/2017 2116 Full Code [id number]  Reubin Milan, MD Inpatient   06/16/2016 1221 06/19/2016 1424 Full Code [id number]  Jani Gravel, MD Inpatient   02/27/2016 0452 02/28/2016 1848 Full Code [id number]  Vianne Bulls, MD ED   07/08/2015 1555 07/11/2015 1710 Full Code [id number]  Modena Jansky, MD Inpatient   01/15/2015 1709 01/18/2015 1550 Full Code [id number]  Theressa Millard, MD Inpatient   09/09/2012 1634 09/10/2012 1248 Full Code [id number]  Angus Palms, RN Inpatient      Home/SNF/Other Home  Chief Complaint shortness of breath  Level of Care/Admitting Diagnosis ED Disposition    ED Disposition Condition [facility] Area: [facility] [[id number]]  Level of Care: Telemetry [5]  Admit to tele based on following criteria: Acute CHF  Diagnosis: Acute CHF (congestive heart failure) [facility]) [[id number]]  Admitting Physician: Rise Patience [phone]  Attending Physician: Rise Patience [phone]  Estimated length of stay: past midnight tomorrow  Certification:: I certify this patient will need inpatient services for at least 2 midnights  PT Class (Do Not Modify): Inpatient [101]  PT Acc Code (Do Not Modify): Private [1]       Medical History Past Medical History:  Diagnosis Date  . Anemia   . Arthritis    hands, shoulders, no meds  . CHF (congestive heart failure) (HCC)    EF60-65%  . Chronic hyperventilation syndrome    w/ obesity tx with albuterol inhaler and oxygen 2L  . COPD (chronic obstructive pulmonary disease) ([place])    uses oxygen 2 L  . Gallstones   . GERD  (gastroesophageal reflux disease)    diet controlled - no meds  . H/O hiatal hernia   . Hypertension   . Hypothyroidism   . Kidney stones   . Morbid obesity ([place])   . Pneumonia    hoispitalized in 08/2011  . Sarcoidosis   . Seasonal allergies   . Sleep apnea    uses CPAP machine     Allergies No Known Allergies  IV Location/Drains/Wounds Patient Lines/Drains/Airways Status   Active Line/Drains/Airways    Name:   Placement date:   Placement time:   Site:   Days:   Peripheral IV 10/26/18 Right;Posterior;Other (Comment) Forearm   10/26/18    -    Forearm   1   Incision (Closed) 08/09/16 Perineum Other (Comment)   08/09/16    1001     809          Labs/Imaging Results for orders placed or performed during the hospital encounter of 10/26/18 (from the past 48 hour(s))  I-stat troponin, ED     Status: None   Collection Time: 10/26/18 11:08 PM  Result Value Ref Range   Troponin i, poc 0.01 0.00 - 0.08 ng/mL   Comment 3            Comment: Due to the release kinetics of cTnI, a negative result within the first hours of the onset of symptoms does not rule out myocardial infarction with certainty. If myocardial infarction is still suspected, repeat the test  at appropriate intervals.   I-Stat beta hCG blood, ED     Status: None   Collection Time: 10/26/18 11:08 PM  Result Value Ref Range   I-stat hCG, quantitative <5.0 <5 mIU/mL   Comment 3            Comment:   GEST. AGE      CONC.  (mIU/mL)   <=1 WEEK        5 - 50     2 WEEKS       50 - 500     3 WEEKS       100 - 10,000     4 WEEKS     1,000 - 30,000        FEMALE AND NON-PREGNANT FEMALE:     LESS THAN 5 mIU/mL   Basic metabolic panel     Status: Abnormal   Collection Time: 10/26/18 11:25 PM  Result Value Ref Range   Sodium 138 135 - 145 mmol/L   Potassium 4.1 3.5 - 5.1 mmol/L   Chloride 105 98 - 111 mmol/L   CO2 23 22 - 32 mmol/L   Glucose, Bld 119 (H) 70 - 99 mg/dL   BUN 16 6 - 20 mg/dL   Creatinine, Ser 0.73  0.44 - 1.00 mg/dL   Calcium 8.4 (L) 8.9 - 10.3 mg/dL   GFR calc non Af Amer >60 >60 mL/min   GFR calc Af Amer >60 >60 mL/min   Anion gap 10 5 - 15    Comment: Performed at [facility], [place] [address] [place], [place] [address]  CBC     Status: Abnormal   Collection Time: 10/26/18 11:25 PM  Result Value Ref Range   WBC 12.9 (H) 4.0 - 10.5 K/uL   RBC 4.92 3.87 - 5.11 MIL/uL   Hemoglobin 11.8 (L) 12.0 - 15.0 g/dL   HCT 39.8 36.0 - 46.0 %   MCV 80.9 80.0 - 100.0 fL   MCH 24.0 (L) 26.0 - 34.0 pg   MCHC 29.6 (L) 30.0 - 36.0 g/dL   RDW 20.4 (H) 11.5 - 15.5 %   Platelets 311 150 - 400 K/uL   nRBC 0.0 0.0 - 0.2 %    Comment: Performed at [facility], [place] [address] [place], [place] [address]  Troponin I - ONCE - STAT     Status: None   Collection Time: 10/26/18 11:25 PM  Result Value Ref Range   Troponin I <0.03 <0.03 ng/mL    Comment: Performed at [facility], [place] [address], [place], [place] [address]  Brain natriuretic peptide     Status: None   Collection Time: 10/26/18 11:25 PM  Result Value Ref Range   B Natriuretic Peptide 52.8 0.0 - 100.0 pg/mL    Comment: Performed at [facility], [place] [address], [place],  [address]   Dg Chest 2 View  Result Date: 10/26/2018 CLINICAL DATA:  Shortness of breath EXAM: CHEST - 2 VIEW COMPARISON:  Chest CT 04/17/2018 FINDINGS: Hazy opacities within both lungs. Moderate cardiomegaly. No pleural effusion or pneumothorax. No focal consolidation. IMPRESSION: Cardiomegaly and mild pulmonary edema. Electronically Signed   By: Ulyses Jarred M.D.   On: 10/26/2018 22:26    Pending Labs Unresulted Labs (From admission, onward)   None      Vitals/Pain Today's Vitals   10/26/18 2227 10/26/18 2230 10/27/18 0000 10/27/18 0030  BP:  (!) 164/76 (!) 145/68 (!) 118/108  Pulse:  81 76 74  Resp:  20 19 19   Temp:      TempSrc:      SpO2:  100% 98% 92%  Weight: (!)  231.8 kg     Height: 5' 5.5" (1.664 m)     PainSc:        Isolation Precautions No active isolations  Medications Medications  furosemide (LASIX) injection 80 mg (80 mg Intravenous Given 10/27/18 0030)    Mobility walks

## 2018-10-27 NOTE — Progress Notes (Signed)
PROGRESS NOTE   [name]  ZYS:[id number]    DOB: [birth year]    DOA: 10/26/2018  PCP: [name], NP   I have briefly reviewed patients previous medical records in [facility].  Brief Narrative:  52 year old female, her son lives with her, independent of activities, PMH of chronic diastolic CHF, chronic respiratory failure on home oxygen 2 L/min, COPD, OSA/OHS on CPAP, sarcoidosis, RA, chronic anemia, GERD, HTN, hypothyroid, morbid obesity, presented to [facility] ED on 10/26/2018 due to progressively worsening dyspnea on exertion, approximately 19 pound weight gain, increasing leg edema over the last 3 weeks despite increasing home dose of Lasix, seen by her pulmonologist and referred to ED for IV diuretics.  Admitted for acute on chronic diastolic CHF.   Assessment & Plan:   Principal Problem:   Acute on chronic diastolic CHF (congestive heart failure) (HCC) Active Problems:   Hypertension   Sarcoidosis   Hypothyroidism   Acute CHF (congestive heart failure) ([place])   1. Acute on chronic diastolic CHF: TTE June 9323 showed EF 60-65%.  Possibly precipitated by dietary/sodium indiscretion.  Reports gaining 19 pound weight over the last 3 weeks.  Failed increased dose of oral diuretics at home.  Treating with IV Lasix 80 mg every 12 hourly.  Follow strict intake output and daily weights.  Diet counseled.  Follows with Dr. [name], cardiology. 2. COPD: No clinical bronchospasm. 3. OSA/OHS: Continue nightly CPAP. 4. Chronic respiratory failure with hypoxia: Secondary to OSA/OHS, COPD and chronic CHF.  Continue oxygen supplementation. 5. Essential hypertension: Controlled.  Continue Cozaar and metoprolol. 6. Rheumatoid arthritis: Continue chronic methotrexate and prednisone.  No acute flare. 7. Sarcoidosis: Continue chronic methotrexate and prednisone.  No clinical flare. 8. Chronic anemia: Relatively stable.  Continue B12 supplements. 9. Hypothyroid: Not on  medications.  Follow TSH. 10. Morbid obesity/Body mass index is 82.74 kg/m. 11. GERD: Continue Pepcid. 12. Hypomagnesemia: Replace and follow.   DVT prophylaxis: Lovenox Code Status: Full Family Communication: None at bedside Disposition: DC home pending clinical improvement, may take 24 to 48 hours.   Consultants:  None  Procedures:  None  Antimicrobials:  None   Subjective: Reports no significant change since admission.  Still has dyspnea on exertion and leg swelling.  Has not been out of bed much.  No chest pain.  ROS: As above, otherwise negative.  Objective:  Vitals:   10/27/18 0328 10/27/18 0425 10/27/18 0536 10/27/18 0909  BP:   (!) 153/59 (!) 130/56  Pulse:   77 80  Resp:   20 20  Temp:   98.3 F (36.8 C) 98.6 F (37 C)  TempSrc:   Oral Oral  SpO2:   97% 98%  Weight:  (!) 229 kg    Height: 5' 5.5" (1.664 m)       Examination:  General exam: Pleasant young female, moderately built and morbidly obese, lying comfortably propped up in bed. Respiratory system: Occasional basal fine crackles but otherwise clear to auscultation. Respiratory effort normal. Cardiovascular system: S1 & S2 heard, RRR.  Unable to appreciate JVD due to thick neck.  Chronic leg edema/1+ pitting.  No murmurs or gallops. Gastrointestinal system: Abdomen is nondistended, soft and nontender. No organomegaly or masses felt. Normal bowel sounds heard. Central nervous system: Alert and oriented. No focal neurological deficits. Extremities: Symmetric 5 x 5 power. Skin: No rashes, lesions or ulcers Psychiatry: Judgement and insight appear normal. Mood & affect appropriate.     Data Reviewed: I have personally reviewed  following labs and imaging studies  CBC: Recent Labs  Lab 10/26/18 2325 10/27/18 0509  WBC 12.9* 12.2*  NEUTROABS  --  9.4*  HGB 11.8* 10.9*  HCT 39.8 37.1  MCV 80.9 82.1  PLT 311 017   Basic Metabolic Panel: Recent Labs  Lab 10/26/18 2325 10/27/18 0509  NA  138 140  K 4.1 3.7  CL 105 101  CO2 23 28  GLUCOSE 119* 106*  BUN 16 17  CREATININE 0.73 0.82  CALCIUM 8.4* 8.8*  MG  --  1.6*   Liver Function Tests: Recent Labs  Lab 10/27/18 0509  AST 11*  ALT 10  ALKPHOS 34*  BILITOT 0.6  PROT 6.8  ALBUMIN 3.7   Coagulation Profile: No results for input(s): INR, PROTIME in the last 168 hours. Cardiac Enzymes: Recent Labs  Lab 10/26/18 2325 10/27/18 0509  TROPONINI <0.03 <0.03   HbA1C: No results for input(s): HGBA1C in the last 72 hours. CBG: No results for input(s): GLUCAP in the last 168 hours.  No results found for this or any previous visit (from the past 240 hour(s)).       Radiology Studies: Dg Chest 2 View  Result Date: 10/26/2018 CLINICAL DATA:  Shortness of breath EXAM: CHEST - 2 VIEW COMPARISON:  Chest CT 04/17/2018 FINDINGS: Hazy opacities within both lungs. Moderate cardiomegaly. No pleural effusion or pneumothorax. No focal consolidation. IMPRESSION: Cardiomegaly and mild pulmonary edema. Electronically Signed   By: Ulyses Jarred M.D.   On: 10/26/2018 22:26        Scheduled Meds: . calcium carbonate  1,250 mg Oral Daily  . cholecalciferol  400 Units Oral Daily  . enoxaparin (LOVENOX) injection  40 mg Subcutaneous Daily  . famotidine  40 mg Oral BID  . folic acid  1 mg Oral Daily  . furosemide  80 mg Intravenous Q12H  . losartan  100 mg Oral Daily  . metoprolol succinate  25 mg Oral Daily  . predniSONE  10 mg Oral Q breakfast  . cyanocobalamin  1,000 mcg Oral Daily   Continuous Infusions:   LOS: 0 days     Vernell Leep, MD, FACP, [facility]. [facility] Pager [phone]  If 7PM-7AM, please contact night-coverage [email or web address] Password [facility] 10/27/2018, 9:43 AM

## 2018-10-27 NOTE — H&P (Signed)
History and Physical    MAVIS GRAVELLE [id number]:[id number] DOB: 04-01-1966 DOA: 10/26/2018  PCP: [name], NP  Patient coming from: Home.  Chief Complaint: Shortness of breath.  HPI: [name] is a 52 y.o. female with history of diastolic CHF, sleep apnea, sarcoidosis, rheumatoid arthritis, chronic anemia, COPD and has been experiencing increasing shortness of breath with exertion over the last 3 weeks.  Has gained increasing weight with increasing peripheral edema.  Patient usually takes Lasix 40 mg twice daily and over the last 2 weeks has been taking 60 twice daily despite which patient's weight gain has been increasing.  Denies chest pain, productive cough fever or chills.  Patient had gone to her pulmonologist office and was referred to the ER for IV diuresis given the patient's acute respiratory failure with increasing peripheral edema.  Patient states she has gained at least 20 pounds over the last 3 weeks.  ED Course: In the ER chest x-ray shows cardiomegaly with mild pulmonary edema.  On exam patient has lower extremity edema.  EKG shows normal sinus rhythm with nonspecific T wave changes.  BNP was around 52 troponin was negative.  Patient was given Lasix 80 mg IV and admitted for acute diastolic CHF.  Review of Systems: As per HPI, rest all negative.   Past Medical History:  Diagnosis Date  . Anemia   . Arthritis    hands, shoulders, no meds  . CHF (congestive heart failure) (HCC)    EF60-65%  . Chronic hyperventilation syndrome    w/ obesity tx with albuterol inhaler and oxygen 2L  . COPD (chronic obstructive pulmonary disease) ([place])    uses oxygen 2 L  . Gallstones   . GERD (gastroesophageal reflux disease)    diet controlled - no meds  . H/O hiatal hernia   . Hypertension   . Hypothyroidism   . Kidney stones   . Morbid obesity ([place])   . Pneumonia    hoispitalized in 08/2011  . Sarcoidosis   . Seasonal allergies   . Sleep apnea    uses CPAP  machine     Past Surgical History:  Procedure Laterality Date  . [place]   x 1  . CHOLECYSTECTOMY  2000  . DILATION AND CURETTAGE OF UTERUS N/A 08/09/2016   Procedure: DILATATION AND CURETTAGE;  Surgeon: Everitt Amber, MD;  Location: [facility];  Service: Gynecology;  Laterality: N/A;  . HYSTEROSCOPY W/D&C  12/27/2011   Procedure: DILATATION AND CURETTAGE /HYSTEROSCOPY;  Surgeon: Maeola Sarah. Landry Mellow, MD;  Location: [facility];  Service: Gynecology;;  . I and D of abcess  05/2011  . INTRAUTERINE DEVICE (IUD) INSERTION N/A 08/09/2016   Procedure: INTRAUTERINE DEVICE (IUD) INSERTION;  Surgeon: Everitt Amber, MD;  Location: [facility];  Service: Gynecology;  Laterality: N/A;  . LUNG BIOPSY    . uterine abletion       reports that she has never smoked. She has never used smokeless tobacco. She reports current alcohol use. She reports that she does not use drugs.  No Known Allergies  Family History  Problem Relation Age of Onset  . Diabetes Father   . Cancer Father 24       colon cancer   . Diabetes Brother   . Deep vein thrombosis Mother   . Aneurysm Sister        d/o brain aneurysm    Prior to Admission medications   Medication Sig Start Date End Date Taking? Authorizing Provider  albuterol (PROAIR  HFA) 108 (90 Base) MCG/ACT inhaler Inhale 1-2 puffs into the lungs every 6 hours as needed for wheezing. Patient taking differently: Inhale 1-2 puffs into the lungs every 6 (six) hours as needed for wheezing or shortness of breath.  01/08/18  Yes Rigoberto Noel, MD  cetirizine (ZYRTEC) 10 MG tablet Take 10 mg by mouth at bedtime as needed for allergies.   Yes [provider]  cholecalciferol (VITAMIN D) 400 UNITS TABS tablet Take 400 Units by mouth daily.   Yes [provider]  Coral Calcium 1000 (390 CA) MG TABS Take 1,000 mg by mouth daily.    Yes [provider]  diclofenac sodium (VOLTAREN) 1 % GEL Apply 2 g topically daily as needed (inflammation).  03/18/18  Yes  [provider]  diphenhydrAMINE (BENADRYL) 25 mg capsule Take 25 mg by mouth every 6 (six) hours as needed (fever).   Yes [provider]  famotidine (PEPCID) 40 MG tablet Take 1 tablet (40 mg total) by mouth 2 (two) times daily. 02/16/17  Yes Barton Dubois, MD  folic acid (FOLVITE) 1 MG tablet Take 1 mg by mouth daily. 01/11/16  Yes [provider]  furosemide (LASIX) 20 MG tablet Take 3 tablets by mouth twice daily.  **NEEDS PHYSICAL APPT WITH CORY** Patient taking differently: Take 60 mg by mouth 2 (two) times daily. Take 3 tablets by mouth twice daily.  **NEEDS PHYSICAL APPT WITH CORY** 10/15/18  Yes Nafziger, Tommi Rumps, NP  HYDROcodone-acetaminophen (NORCO) 10-325 MG tablet Take 1 tablet by mouth every 6 (six) hours as needed (pain). 11/21/16  Yes Pete Pelt, PA-C  losartan (COZAAR) 100 MG tablet TAKE 1 TABLET (100 MG TOTAL) DAILY BY MOUTH. 05/31/18  Yes Nafziger, Tommi Rumps, NP  methotrexate 250 MG/10ML injection once a week. Friday 03/20/18  Yes [provider]  metoprolol succinate (TOPROL-XL) 25 MG 24 hr tablet Take 1 tablet (25 mg total) by mouth daily. Needs physical for future refills. 09/14/18  Yes Nafziger, Tommi Rumps, NP  predniSONE (DELTASONE) 10 MG tablet Take 1 tablet (10 mg total) by mouth daily. 01/17/18  Yes Florencia Reasons, MD  triamcinolone acetonide (KENALOG) 40 MG/ML injection Inject 40 mg into the muscle every 3 (three) months. For knee pain   Yes [provider]  triamcinolone ointment (KENALOG) 0.1 % Apply 1 application topically See admin instructions. Applies twice a day as needed for break out of Sarcoidosis   Yes [provider]  vitamin B-12 1000 MCG tablet Take 1 tablet (1,000 mcg total) by mouth daily. 06/19/16  Yes Lavina Hamman, MD  DUREZOL 0.05 % EMUL INSTILL 1 DROP INTO LEFT EYE 3 TIMES A DAY AS DIRECTED 08/01/18   [provider]  ketorolac (ACULAR) 0.5 % ophthalmic solution  08/23/18   [provider]   levothyroxine (SYNTHROID, LEVOTHROID) 100 MCG tablet Take 1 tablet (100 mcg total) by mouth daily at 6 (six) AM. Patient not taking: Reported on 10/27/2018 09/07/18   Nafziger, Tommi Rumps, NP  moxifloxacin (VIGAMOX) 0.5 % ophthalmic solution INSTILL 1 DROP INTO LEFT EYE 4 TIMES A DAY AS DIRECTED 08/01/18   [provider]  NON FORMULARY 2 liter of oxygen    [provider]    Physical Exam: Vitals:   10/27/18 0252 10/27/18 0319 10/27/18 0328 10/27/18 0425  BP: (!) 160/62 (!) 147/66    Pulse: 81 85    Resp: 19 (!) 22    Temp:  98.5 F (36.9 C)    TempSrc:  Oral  SpO2: 94% 96%    Weight:    (!) 229 kg  Height:   5' 5.5" (1.664 m)       Constitutional: Moderately built and nourished. Vitals:   10/27/18 0252 10/27/18 0319 10/27/18 0328 10/27/18 0425  BP: (!) 160/62 (!) 147/66    Pulse: 81 85    Resp: 19 (!) 22    Temp:  98.5 F (36.9 C)    TempSrc:  Oral    SpO2: 94% 96%    Weight:    (!) 229 kg  Height:   5' 5.5" (1.664 m)    Eyes: Anicteric no pallor. ENMT: No discharge from the ears eyes nose or mouth. Neck: No mass felt.  No neck rigidity.  JVD not appreciable. Respiratory: No rhonchi or crepitations. Cardiovascular: S1-S2 heard. Abdomen: Soft nontender bowel sounds present. Musculoskeletal: Bilateral lower extremity edema present. Skin: No rash.  Chronic skin changes. Neurologic: Alert awake oriented to time place and person.  Moves all extremities.   Psychiatric: Appears normal.   Labs on Admission: I have personally reviewed following labs and imaging studies  CBC: Recent Labs  Lab 10/26/18 2325  WBC 12.9*  HGB 11.8*  HCT 39.8  MCV 80.9  PLT 161   Basic Metabolic Panel: Recent Labs  Lab 10/26/18 2325  NA 138  K 4.1  CL 105  CO2 23  GLUCOSE 119*  BUN 16  CREATININE 0.73  CALCIUM 8.4*   GFR: Estimated Creatinine Clearance: 162.4 mL/min (by C-G formula based on SCr of 0.73 mg/dL). Liver Function Tests: No results for input(s):  AST, ALT, ALKPHOS, BILITOT, PROT, ALBUMIN in the last 168 hours. No results for input(s): LIPASE, AMYLASE in the last 168 hours. No results for input(s): AMMONIA in the last 168 hours. Coagulation Profile: No results for input(s): INR, PROTIME in the last 168 hours. Cardiac Enzymes: Recent Labs  Lab 10/26/18 2325  TROPONINI <0.03   BNP (last 3 results) No results for input(s): PROBNP in the last 8760 hours. HbA1C: No results for input(s): HGBA1C in the last 72 hours. CBG: No results for input(s): GLUCAP in the last 168 hours. Lipid Profile: No results for input(s): CHOL, HDL, LDLCALC, TRIG, CHOLHDL, LDLDIRECT in the last 72 hours. Thyroid Function Tests: No results for input(s): TSH, T4TOTAL, FREET4, T3FREE, THYROIDAB in the last 72 hours. Anemia Panel: No results for input(s): VITAMINB12, FOLATE, FERRITIN, TIBC, IRON, RETICCTPCT in the last 72 hours. Urine analysis:    Component Value Date/Time   COLORURINE YELLOW 06/16/2016 0100   APPEARANCEUR CLOUDY (A) 06/16/2016 0100   LABSPEC 1.021 06/16/2016 0100   PHURINE 6.0 06/16/2016 0100   GLUCOSEU NEGATIVE 06/16/2016 0100   HGBUR LARGE (A) 06/16/2016 0100   BILIRUBINUR NEGATIVE 06/16/2016 0100   KETONESUR NEGATIVE 06/16/2016 0100   PROTEINUR NEGATIVE 06/16/2016 0100   UROBILINOGEN 0.2 09/10/2012 0106   NITRITE NEGATIVE 06/16/2016 0100   LEUKOCYTESUR NEGATIVE 06/16/2016 0100   Sepsis Labs: @LABRCNTIP (procalcitonin:4,lacticidven:4) )No results found for this or any previous visit (from the past 240 hour(s)).   Radiological Exams on Admission: Dg Chest 2 View  Result Date: 10/26/2018 CLINICAL DATA:  Shortness of breath EXAM: CHEST - 2 VIEW COMPARISON:  Chest CT 04/17/2018 FINDINGS: Hazy opacities within both lungs. Moderate cardiomegaly. No pleural effusion or pneumothorax. No focal consolidation. IMPRESSION: Cardiomegaly and mild pulmonary edema. Electronically Signed   By: Ulyses Jarred M.D.   On: 10/26/2018 22:26    EKG:  Independently reviewed.  Normal sinus rhythm with nonspecific ST changes.  Assessment/Plan Principal Problem:   Acute on chronic diastolic CHF (congestive heart failure) (HCC) Active Problems:   Hypertension   Sarcoidosis   Hypothyroidism   Acute CHF (congestive heart failure) ([place])    1. Acute on chronic diastolic CHF last EF measured in June 2019 was 60 to 65%.  Patient has gained at least 20 pounds and in the chart we can find that patient has gained at least 14 kg from past.  Patient has been placed on Lasix 80 mg IV every 12.  Based on the response will need to further titrate.  Follow intake output metabolic panel and daily weights.  Check troponin. 2. Hypertension on Cozaar and metoprolol. 3. COPD on nebulizer which will be continued but not actively wheezing. 4. History of rheumatoid arthritis on methotrexate and prednisone which will be continued. 5. History of sarcoidosis patient is presently on methotrexate and prednisone for sarcoidosis. 6. Chronic anemia on B12 supplements could be from methotrexate. 7. Hypothyroidism presently not taking medication check TSH. 8. Sleep apnea on CPAP. 9. Morbid obesity.   DVT prophylaxis: Lovenox. Code Status: Full code. Family Communication: Discussed with patient. Disposition Plan: Home. Consults called: None. Admission status: Inpatient.   Rise Patience MD [facility] Pager [phone].  If 7PM-7AM, please contact night-coverage [email or web address] Password [facility]  10/27/2018, 4:57 AM

## 2018-10-28 DIAGNOSIS — E038 Other specified hypothyroidism: Secondary | ICD-10-CM

## 2018-10-28 LAB — CBC
HCT: 36.1 % (ref 36.0–46.0)
HEMOGLOBIN: 10.6 g/dL — AB (ref 12.0–15.0)
MCH: 23.8 pg — ABNORMAL LOW (ref 26.0–34.0)
MCHC: 29.4 g/dL — ABNORMAL LOW (ref 30.0–36.0)
MCV: 80.9 fL (ref 80.0–100.0)
Platelets: 196 10*3/uL (ref 150–400)
RBC: 4.46 MIL/uL (ref 3.87–5.11)
RDW: 20.3 % — ABNORMAL HIGH (ref 11.5–15.5)
WBC: 9.7 10*3/uL (ref 4.0–10.5)
nRBC: 0 % (ref 0.0–0.2)

## 2018-10-28 LAB — BASIC METABOLIC PANEL
Anion gap: 10 (ref 5–15)
BUN: 20 mg/dL (ref 6–20)
CHLORIDE: 102 mmol/L (ref 98–111)
CO2: 27 mmol/L (ref 22–32)
Calcium: 8.8 mg/dL — ABNORMAL LOW (ref 8.9–10.3)
Creatinine, Ser: 0.78 mg/dL (ref 0.44–1.00)
GFR calc Af Amer: >60 mL/min (ref >60)
GFR calc non Af Amer: >60 mL/min (ref >60)
Glucose, Bld: 96 mg/dL (ref 70–99)
Potassium: 3.5 mmol/L (ref 3.5–5.1)
Sodium: 139 mmol/L (ref 135–145)

## 2018-10-28 LAB — MAGNESIUM: Magnesium: 1.9 mg/dL (ref 1.7–2.4)

## 2018-10-28 MED ORDER — POTASSIUM CHLORIDE CRYS ER 20 MEQ PO TBCR
40.0000 meq | EXTENDED_RELEASE_TABLET | Freq: Every day | ORAL | Status: DC
  Administered 2018-10-28 – 2018-10-29 (×2): 40 meq via ORAL
  Filled 2018-10-28 (×2): qty 2

## 2018-10-28 MED ORDER — MENTHOL 3 MG MT LOZG
1.0000 | LOZENGE | OROMUCOSAL | Status: DC | PRN
  Filled 2018-10-28: qty 9

## 2018-10-28 NOTE — Plan of Care (Signed)
Pt alert and oriented, doing well this am. C/O worsening scratchy throat, MD aware and ordered lozenges. Pt states throat feels better with use of lozenges. RN to monitor. Continue daily weights and strict I/O's.

## 2018-10-28 NOTE — Progress Notes (Signed)
PROGRESS NOTE   [name]  IBB:[id number]    DOB: Oct 30, 1966    DOA: 10/26/2018  PCP: [name], NP   I have briefly reviewed patients previous medical records in [facility].  Brief Narrative:  52 year old female, her son lives with her, independent of activities, PMH of chronic diastolic CHF, chronic respiratory failure on home oxygen 2 L/min, COPD, OSA/OHS on CPAP, sarcoidosis, RA, chronic anemia, GERD, HTN, hypothyroid, morbid obesity, presented to [facility] ED on 10/26/2018 due to progressively worsening dyspnea on exertion, approximately 19 pound weight gain, increasing leg edema over the last 3 weeks despite increasing home dose of Lasix, seen by her pulmonologist and referred to ED for IV diuretics.  Admitted for acute on chronic diastolic CHF.  Slowly improving.   Assessment & Plan:   Principal Problem:   Acute on chronic diastolic CHF (congestive heart failure) (HCC) Active Problems:   Hypertension   Sarcoidosis   Hypothyroidism   Acute CHF (congestive heart failure) ([place])   1. Acute on chronic diastolic CHF: TTE June 4503 showed EF 60-65%.  Possibly precipitated by dietary/sodium indiscretion.  Reports gaining 19 pound weight over the last 3 weeks.  Failed increased dose of oral diuretics at home.  Treating with IV Lasix 80 mg every 12 hourly.  Follow strict intake output and daily weights.  Diet counseled.  Follows with Dr. [name], Cardiology.  -6.1 L thus far.  Has lost approximately 13pounds since admission.  Continue IV Lasix for another day and consider transitioning to oral Lasix if possible in a.m. 2. COPD: No clinical bronchospasm.  Stable. 3. OSA/OHS: Continue nightly CPAP.  Stable. 4. Chronic respiratory failure with hypoxia: Secondary to OSA/OHS, COPD and chronic CHF.  Continue oxygen supplementation.  Stable 5. Essential hypertension: Controlled.  Continue Cozaar and metoprolol. 6. Rheumatoid arthritis: Continue chronic methotrexate  and prednisone.  No acute flare. 7. Sarcoidosis: Continue chronic methotrexate and prednisone.  No clinical flare. 8. Chronic anemia: Stable.  Continue B12 supplements. 9. Hypothyroid: Not on medications. TSH normal/2.967 10. Morbid obesity/Body mass index is 81.64 kg/m. 11. GERD: Continue Pepcid. 12. Hypomagnesemia: Replaced yesterday.  Check magnesium now. 13. Sore throat: No acute findings on exam.  Cepacol lozenges.   DVT prophylaxis: Lovenox Code Status: Full Family Communication: None at bedside Disposition: DC home pending clinical improvement, possibly 12/30.   Consultants:  None  Procedures:  None  Antimicrobials:  None   Subjective: Gradually feels better.  Dyspnea improved but still has dyspnea on exertion.  Leg swelling slowly improving.  Weight decreasing.  No chest pain.  Sore throat.  ROS: As above, otherwise negative.  Objective:  Vitals:   10/27/18 2114 10/28/18 0500 10/28/18 0603 10/28/18 1341  BP: 130/65  (!) 147/71 (!) 138/59  Pulse: 74  66 81  Resp: 17  17 16   Temp: (!) 97.4 F (36.3 C)  98.1 F (36.7 C) 98.6 F (37 C)  TempSrc: Oral  Oral Oral  SpO2: 90%  97% 97%  Weight:  (!) 226 kg    Height:        Examination:  General exam: Pleasant young female, moderately built and morbidly obese, sitting up comfortably in chair this morning. Respiratory system: Occasional basal fine crackles but otherwise clear to auscultation. Respiratory effort normal.  Stable without change. Cardiovascular system: S1 & S2 heard, RRR.  Unable to appreciate JVD due to thick neck.  Chronic leg edema/1+ pitting.  No murmurs or gallops.  Telemetry personally reviewed: Sinus rhythm. Gastrointestinal  system: Abdomen is nondistended, soft and nontender. No organomegaly or masses felt. Normal bowel sounds heard. Central nervous system: Alert and oriented. No focal neurological deficits. Extremities: Symmetric 5 x 5 power. Skin: No rashes, lesions or ulcers Psychiatry:  Judgement and insight appear normal. Mood & affect appropriate.     Data Reviewed: I have personally reviewed following labs and imaging studies  CBC: Recent Labs  Lab 10/26/18 2325 10/27/18 0509 10/28/18 0608  WBC 12.9* 12.2* 9.7  NEUTROABS  --  9.4*  --   HGB 11.8* 10.9* 10.6*  HCT 39.8 37.1 36.1  MCV 80.9 82.1 80.9  PLT 311 229 716   Basic Metabolic Panel: Recent Labs  Lab 10/26/18 2325 10/27/18 0509 10/28/18 0608  NA 138 140 139  K 4.1 3.7 3.5  CL 105 101 102  CO2 23 28 27   GLUCOSE 119* 106* 96  BUN 16 17 20   CREATININE 0.73 0.82 0.78  CALCIUM 8.4* 8.8* 8.8*  MG  --  1.6*  --    Liver Function Tests: Recent Labs  Lab 10/27/18 0509  AST 11*  ALT 10  ALKPHOS 34*  BILITOT 0.6  PROT 6.8  ALBUMIN 3.7   Coagulation Profile: No results for input(s): INR, PROTIME in the last 168 hours. Cardiac Enzymes: Recent Labs  Lab 10/26/18 2325 10/27/18 0509  TROPONINI <0.03 <0.03   HbA1C: No results for input(s): HGBA1C in the last 72 hours. CBG: No results for input(s): GLUCAP in the last 168 hours.  No results found for this or any previous visit (from the past 240 hour(s)).       Radiology Studies: Dg Chest 2 View  Result Date: 10/26/2018 CLINICAL DATA:  Shortness of breath EXAM: CHEST - 2 VIEW COMPARISON:  Chest CT 04/17/2018 FINDINGS: Hazy opacities within both lungs. Moderate cardiomegaly. No pleural effusion or pneumothorax. No focal consolidation. IMPRESSION: Cardiomegaly and mild pulmonary edema. Electronically Signed   By: Ulyses Jarred M.D.   On: 10/26/2018 22:26        Scheduled Meds: . calcium carbonate  1,250 mg Oral Daily  . cholecalciferol  400 Units Oral Daily  . enoxaparin (LOVENOX) injection  100 mg Subcutaneous Q24H  . famotidine  40 mg Oral BID  . folic acid  1 mg Oral Daily  . furosemide  80 mg Intravenous Q12H  . losartan  100 mg Oral Daily  . metoprolol succinate  25 mg Oral Daily  . predniSONE  10 mg Oral Q breakfast  .  cyanocobalamin  1,000 mcg Oral Daily   Continuous Infusions:   LOS: 1 day     Vernell Leep, MD, FACP, [facility]. [facility] Pager [phone]  If 7PM-7AM, please contact night-coverage [email or web address] Password [facility] 10/28/2018, 5:19 PM

## 2018-10-29 ENCOUNTER — Other Ambulatory Visit: Payer: Self-pay | Admitting: Adult Health

## 2018-10-29 DIAGNOSIS — I10 Essential (primary) hypertension: Secondary | ICD-10-CM | POA: Diagnosis not present

## 2018-10-29 DIAGNOSIS — I5033 Acute on chronic diastolic (congestive) heart failure: Secondary | ICD-10-CM | POA: Diagnosis not present

## 2018-10-29 DIAGNOSIS — E038 Other specified hypothyroidism: Secondary | ICD-10-CM | POA: Diagnosis not present

## 2018-10-29 LAB — BASIC METABOLIC PANEL
Anion gap: 11 (ref 5–15)
BUN: 25 mg/dL — AB (ref 6–20)
CO2: 30 mmol/L (ref 22–32)
Calcium: 8.5 mg/dL — ABNORMAL LOW (ref 8.9–10.3)
Chloride: 101 mmol/L (ref 98–111)
Creatinine, Ser: 0.99 mg/dL (ref 0.44–1.00)
GFR calc Af Amer: >60 mL/min (ref >60)
GFR calc non Af Amer: >60 mL/min (ref >60)
Glucose, Bld: 101 mg/dL — ABNORMAL HIGH (ref 70–99)
POTASSIUM: 3.9 mmol/L (ref 3.5–5.1)
Sodium: 142 mmol/L (ref 135–145)

## 2018-10-29 MED ORDER — FUROSEMIDE 40 MG PO TABS: 80.0000 mg | ORAL_TABLET | Freq: Two times a day (BID) | ORAL | 0 refills | Status: DC

## 2018-10-29 MED ORDER — POTASSIUM CHLORIDE CRYS ER 20 MEQ PO TBCR: 40.0000 meq | EXTENDED_RELEASE_TABLET | Freq: Every day | ORAL | 0 refills | Status: DC

## 2018-10-29 MED ORDER — ALBUTEROL SULFATE HFA 108 (90 BASE) MCG/ACT IN AERS: 1.0000 | INHALATION_SPRAY | Freq: Four times a day (QID) | RESPIRATORY_TRACT | Status: DC | PRN

## 2018-10-29 NOTE — Discharge Summary (Signed)
Physician Discharge Summary  [name] [id number] DOB: Aug 09, 1966  PCP: [name], NP  Admit date: 10/26/2018 Discharge date: 10/29/2018  Recommendations for Outpatient Follow-up:  1. [name], NP/PCP in 1 week with repeat labs (CBC & BMP). 2. Dr. Candee Furbish, Cardiology in 2 weeks.  Home Health: None Equipment/Devices: None  Discharge Condition: Improved and stable CODE STATUS: Full Diet recommendation: Heart healthy diet.  Discharge Diagnoses:  Principal Problem:   Acute on chronic diastolic CHF (congestive heart failure) (HCC) Active Problems:   Hypertension   Sarcoidosis   Hypothyroidism   Acute CHF (congestive heart failure) (HCC)   Brief Summary: 52 year old female, her son lives with her, independent of activities, PMH of chronic diastolic CHF, chronic respiratory failure on home oxygen 2 L/min, COPD, OSA/OHS on CPAP, sarcoidosis, RA, chronic anemia, GERD, HTN, hypothyroid, morbid obesity, presented to [facility] ED on 10/26/2018 due to progressively worsening dyspnea on exertion, approximately 19 pound weight gain, increasing leg edema over the last 3 weeks despite increasing home dose of Lasix, seen by her pulmonologist and referred to ED for IV diuretics.  Admitted for acute on chronic diastolic CHF.     Assessment & Plan:  1. Acute on chronic diastolic CHF: TTE June 7341 showed EF 60-65%.  Likely precipitated by dietary/sodium indiscretion (had multiple birthday celebrations recently).  Reports gaining 19 pound weight over the last 3 weeks.  Failed increased dose of oral diuretics at home.  Treated with IV Lasix 80 mg every 12 hourly.  Diet counseled.  Follows with Dr. [name], Cardiology.   -10.5 L since admission.  Has lost approximately 17.5 pounds since admission.  Clinically improved.  Will discharge on increased dose of Lasix 80 mg twice daily at least for a week until close outpatient follow-up with PCP at which time may  consider adjusting Lasix dose as deemed necessary.  Also recommend follow-up with her Cardiologist soon.  Potassium supplements added. 2. COPD: No clinical bronchospasm.  Stable. 3. OSA/OHS: Continue nightly CPAP.  Stable. 4. Chronic respiratory failure with hypoxia: Secondary to OSA/OHS, COPD and chronic CHF.  Continue oxygen supplementation.    Patient reports that she uses oxygen only with activity and not at rest. 5. Essential hypertension: Controlled.  Continue Cozaar and metoprolol. 6. Rheumatoid arthritis: Continue chronic methotrexate and prednisone.  No acute flare. 7. Sarcoidosis: Continue chronic methotrexate and prednisone.  No clinical flare. 8. Chronic anemia: Stable.  Continue B12 & folate supplements. 9. Hypothyroid: Not on medications. TSH normal/2.967 10. Morbid obesity/Body mass index is 81.64 kg/m. 11. GERD: Continue Pepcid. 12. Hypomagnesemia: Replaced 13. Sore throat: No acute findings on exam.  Cepacol lozenges.  Almost resolved.   Consultants:  None  Procedures:  None   Discharge Instructions  Discharge Instructions    (HEART FAILURE PATIENTS) Call MD:  Anytime you have any of the following symptoms: 1) 3 pound weight gain in 24 hours or 5 pounds in 1 week 2) shortness of breath, with or without a dry hacking cough 3) swelling in the hands, feet or stomach 4) if you have to sleep on extra pillows at night in order to breathe.   Complete by:  As directed    Call MD for:  difficulty breathing, headache or visual disturbances   Complete by:  As directed    Call MD for:  extreme fatigue   Complete by:  As directed    Call MD for:  persistant dizziness or light-headedness   Complete by:  As directed  Diet - low sodium heart healthy   Complete by:  As directed    Increase activity slowly   Complete by:  As directed        Medication List    STOP taking these medications   levothyroxine 100 MCG tablet Commonly known as:  SYNTHROID, LEVOTHROID      TAKE these medications   albuterol 108 (90 Base) MCG/ACT inhaler Commonly known as:  PROAIR HFA Inhale 1-2 puffs into the lungs every 6 (six) hours as needed for wheezing or shortness of breath. What changed:  See the new instructions.   cetirizine 10 MG tablet Commonly known as:  ZYRTEC Take 10 mg by mouth at bedtime as needed for allergies.   cholecalciferol 10 MCG (400 UNIT) Tabs tablet Commonly known as:  VITAMIN D3 Take 400 Units by mouth daily.   Coral Calcium 1000 (390 Ca) MG Tabs Take 1,000 mg by mouth daily.   cyanocobalamin 1000 MCG tablet Take 1 tablet (1,000 mcg total) by mouth daily.   diclofenac sodium 1 % Gel Commonly known as:  VOLTAREN Apply 2 g topically daily as needed (inflammation).   diphenhydrAMINE 25 mg capsule Commonly known as:  BENADRYL Take 25 mg by mouth every 6 (six) hours as needed (fever).   DUREZOL 0.05 % Emul Generic drug:  Difluprednate INSTILL 1 DROP INTO LEFT EYE 3 TIMES A DAY AS DIRECTED   famotidine 40 MG tablet Commonly known as:  PEPCID Take 1 tablet (40 mg total) by mouth 2 (two) times daily.   folic acid 1 MG tablet Commonly known as:  FOLVITE Take 1 mg by mouth daily.   furosemide 40 MG tablet Commonly known as:  LASIX Take 2 tablets (80 mg total) by mouth 2 (two) times daily. What changed:    medication strength  how much to take  how to take this  when to take this  additional instructions   HYDROcodone-acetaminophen 10-325 MG tablet Commonly known as:  NORCO Take 1 tablet by mouth every 6 (six) hours as needed (pain).   KENALOG 40 MG/ML injection Generic drug:  triamcinolone acetonide Inject 40 mg into the muscle every 3 (three) months. For knee pain   ketorolac 0.5 % ophthalmic solution Commonly known as:  ACULAR   losartan 100 MG tablet Commonly known as:  COZAAR TAKE 1 TABLET (100 MG TOTAL) DAILY BY MOUTH.   methotrexate 250 MG/10ML injection once a week. Friday   metoprolol succinate 25 MG 24  hr tablet Commonly known as:  TOPROL-XL Take 1 tablet (25 mg total) by mouth daily. Needs physical for future refills.   moxifloxacin 0.5 % ophthalmic solution Commonly known as:  VIGAMOX INSTILL 1 DROP INTO LEFT EYE 4 TIMES A DAY AS DIRECTED   NON FORMULARY 2 liter of oxygen   potassium chloride SA 20 MEQ tablet Commonly known as:  K-DUR,KLOR-CON Take 2 tablets (40 mEq total) by mouth daily. Start taking on:  October 30, 2018   predniSONE 10 MG tablet Commonly known as:  DELTASONE Take 1 tablet (10 mg total) by mouth daily.   triamcinolone ointment 0.1 % Commonly known as:  KENALOG Apply 1 application topically See admin instructions. Applies twice a day as needed for break out of Sarcoidosis      Follow-up Information    [name], NP. Schedule an appointment as soon as possible for a visit in 1 week(s).   Specialty:  Family Medicine Why:  To be seen with repeat labs (CBC & BMP). Contact  information: [place] [address] [phone]        Jerline Pain, MD. Schedule an appointment as soon as possible for a visit in 2 week(s).   Specialty:  Cardiology Contact information: [address] [place] [address] [phone]          No Known Allergies    Procedures/Studies: Dg Chest 2 View  Result Date: 10/26/2018 CLINICAL DATA:  Shortness of breath EXAM: CHEST - 2 VIEW COMPARISON:  Chest CT 04/17/2018 FINDINGS: Hazy opacities within both lungs. Moderate cardiomegaly. No pleural effusion or pneumothorax. No focal consolidation. IMPRESSION: Cardiomegaly and mild pulmonary edema. Electronically Signed   By: Ulyses Jarred M.D.   On: 10/26/2018 22:26      Subjective: Overall feels much better.  Dyspnea almost resolved and back to baseline.  Leg edema significantly improved.  Sore throat resolved.  No chest pain.  Denies any other complaints.  Discharge Exam:  Vitals:   10/28/18 2037 10/29/18 0530 10/29/18  0945 10/29/18 1308  BP: (!) 150/60 127/66 120/62 (!) 167/62  Pulse: 76 78 74 83  Resp: 18 18  (!) 22  Temp: 98.5 F (36.9 C) 98.6 F (37 C)  98.9 F (37.2 C)  TempSrc: Oral Oral  Oral  SpO2: 99% 94%  97%  Weight:  (!) 223.9 kg    Height:        General exam: Pleasant young female, moderately built and morbidly obese, sitting up comfortably in chair this morning. Respiratory system: Clear to auscultation.  Respiratory effort normal.  Cardiovascular system: S1 & S2 heard, RRR.  Unable to appreciate JVD due to thick neck.  Chronic leg edema but has improved since admission, trace now.  No murmurs or gallops.   Gastrointestinal system: Abdomen is nondistended, soft and nontender. No organomegaly or masses felt. Normal bowel sounds heard. Central nervous system: Alert and oriented. No focal neurological deficits. Extremities: Symmetric 5 x 5 power. Skin: No rashes, lesions or ulcers Psychiatry: Judgement and insight appear normal. Mood & affect appropriate.     The results of significant diagnostics from this hospitalization (including imaging, microbiology, ancillary and laboratory) are listed below for reference.     Labs: CBC: Recent Labs  Lab 10/26/18 2325 10/27/18 0509 10/28/18 0608  WBC 12.9* 12.2* 9.7  NEUTROABS  --  9.4*  --   HGB 11.8* 10.9* 10.6*  HCT 39.8 37.1 36.1  MCV 80.9 82.1 80.9  PLT 311 229 600   Basic Metabolic Panel: Recent Labs  Lab 10/26/18 2325 10/27/18 0509 10/28/18 0608 10/28/18 1812 10/29/18 0521  NA 138 140 139  --  142  K 4.1 3.7 3.5  --  3.9  CL 105 101 102  --  101  CO2 23 28 27   --  30  GLUCOSE 119* 106* 96  --  101*  BUN 16 17 20   --  25*  CREATININE 0.73 0.82 0.78  --  0.99  CALCIUM 8.4* 8.8* 8.8*  --  8.5*  MG  --  1.6*  --  1.9  --    Liver Function Tests: Recent Labs  Lab 10/27/18 0509  AST 11*  ALT 10  ALKPHOS 34*  BILITOT 0.6  PROT 6.8  ALBUMIN 3.7   BNP (last 3 results) Recent Labs    01/14/18 0511  04/16/18 2310 10/26/18 2325  BNP 11.1 36.0 52.8   Cardiac Enzymes: Recent Labs  Lab 10/26/18 2325 10/27/18 0509  TROPONINI <0.03 <0.03  Thyroid function studies Recent Labs    10/27/18 0509  TSH 2.967      Time coordinating discharge: 35 minutes  SIGNED:  Vernell Leep, MD, FACP, [facility]. [facility] Pager [phone]  If 7PM-7AM, please contact night-coverage [email or web address] Password [email or web address] 10/29/2018, 1:20 PM

## 2018-10-29 NOTE — Discharge Instructions (Signed)

## 2018-10-30 ENCOUNTER — Ambulatory Visit: Payer: Self-pay | Admitting: *Deleted

## 2018-10-30 ENCOUNTER — Telehealth: Payer: Self-pay | Admitting: *Deleted

## 2018-10-30 NOTE — Telephone Encounter (Signed)
Per Tommi Rumps, ok for the pt to be seen on 11/01/18.  Pt has been scheduled.  Nothing further needed at this time.

## 2018-10-30 NOTE — Telephone Encounter (Signed)
Pt calling back stating that the nurse suggested she go to hospital and that she stated that she's not going back to hospital because she just went yesterday she states that she called the hospital they advised her that she didn't need to come back there to go see her provider she states that she feel like she's being thrown around I tried to call nurse pt hung up   Call to office to see if there is anyone who is familiar with patient or has any influence with her. No one at office has influence. Call to patient encouraged patient to go to UC/ED and not to wait for office to reopen. Patient states she is using throat lozenges, she has her inhaler if needed, she is using Ibuprofen for fever and she is on limited fluids. Told patient not to get into trouble waiting and please go to UC/ED if she gets worse.

## 2018-10-30 NOTE — Telephone Encounter (Signed)
Transition Care Management Follow-up Telephone Call  Admit date: 10/26/2018 Discharge date: 10/29/2018  Recommendations for Outpatient Follow-up:  1. [name], NP/PCP in 1 week with repeat labs (CBC & BMP). 2. Dr. Candee Furbish, Cardiology in 2 weeks.  Home Health: None Equipment/Devices: None  Discharge Condition: Improved and stable CODE STATUS: Full Diet recommendation: Heart healthy diet.  Discharge Diagnoses:  Principal Problem:   Acute on chronic diastolic CHF (congestive heart failure) (HCC) Active Problems:   Hypertension   Sarcoidosis   Hypothyroidism   Acute CHF (congestive heart failure) ([place])   How have you been since you were released from the hospital? Sore throat and fever 101.  Feeling better today.  Appointment made for acute visit 11/01/18   Do you understand why you were in the hospital? yes   Do you understand the discharge instructions? yes   Where were you discharged to? home   Items Reviewed:  Medications reviewed: yes  Allergies reviewed: yes  Dietary changes reviewed: yes  Referrals reviewed: yes   Functional Questionnaire:   Activities of Daily Living (ADLs):   She states they are independent in the following: none States they require assistance with the following: none   Any transportation issues/concerns?: no   Any patient concerns? yes   Confirmed importance and date/time of follow-up visits scheduled yes  Provider Appointment booked with [name], NP 11/07/18 at 8:30 am  Confirmed with patient if condition begins to worsen call PCP or go to the ER.  Patient was given the office number and encouraged to call back with question or concerns.  : yes

## 2018-10-30 NOTE — Telephone Encounter (Signed)
Pt reports D/Ced from hospital yesterday, admitted 10/26/18; acute CHF. Pt states she "Felt a cold coming on while in the hospital."  Reports worsening symptoms this AM. Temp 101.0.  Productive cough, clear, wheezing. On home O2.Marland KitchenMarland KitchenSats 92-93% on 2 L's.  Reports SOB, cough is severe.  Reports chest tightness. Speech haltering during call with audible wheezing. Pt also reports she was exposed to the flu by her [place]. Pt directed to ED. Offered to call EMS, pt. declined, states her son will transport.  Reason for Disposition . Difficulty breathing  Answer Assessment - Initial Assessment Questions 1. ONSET: "When did the cough begin?"      10/26/18 while in hospital 2. SEVERITY: "How bad is the cough today?"      severe 3. RESPIRATORY DISTRESS: "Describe your breathing."     On O2. 2ls   92-93% on 2ls 4. FEVER: "Do you have a fever?" If so, ask: "What is your temperature, how was it measured, and when did it start?"     101.0 this AM 5. SPUTUM: "Describe the color of your sputum" (clear, white, yellow, green)     clear 6. HEMOPTYSIS: "Are you coughing up any blood?" If so ask: "How much?" (flecks, streaks, tablespoons, etc.)     no 7. CARDIAC HISTORY: "Do you have any history of heart disease?" (e.g., heart attack, congestive heart failure)      CHF, HTN 8. LUNG HISTORY: "Do you have any history of lung disease?"  (e.g., pulmonary embolus, asthma, emphysema)     COPD, CRF 9. PE RISK FACTORS: "Do you have a history of blood clots?" (or: recent major surgery, recent prolonged travel, bedridden)     Yes; hospitalized 10/26/18 -10/29/18.  Exposed to flu by [place] 10. OTHER SYMPTOMS: "Do you have any other symptoms?" (e.g., runny nose, wheezing, chest pain)   Wheezing, chest tight when coughing  Protocols used: COUGH - ACUTE PRODUCTIVE-A-AH

## 2018-10-30 NOTE — Telephone Encounter (Signed)
FYI

## 2018-10-30 NOTE — Telephone Encounter (Signed)
Was notified by Ria Comment who spoke to the [facility] nurse that the pt called back to report that she will not go to ED as advised.  Clarita Crane to have the nurse document and close.  Nothing further to do at this time.

## 2018-10-30 NOTE — Telephone Encounter (Signed)
Denied.  Filled on 10/29/18 by Dr. Dellia Nims.  Nothing further needed.

## 2018-11-01 ENCOUNTER — Ambulatory Visit: Payer: Self-pay | Admitting: Adult Health

## 2018-11-07 ENCOUNTER — Encounter: Payer: Self-pay | Admitting: Adult Health

## 2018-11-07 ENCOUNTER — Ambulatory Visit (INDEPENDENT_AMBULATORY_CARE_PROVIDER_SITE_OTHER): Payer: Medicare HMO | Admitting: Adult Health

## 2018-11-07 ENCOUNTER — Encounter: Payer: Self-pay | Admitting: Nurse Practitioner

## 2018-11-07 ENCOUNTER — Ambulatory Visit (INDEPENDENT_AMBULATORY_CARE_PROVIDER_SITE_OTHER): Payer: Medicare HMO | Admitting: Nurse Practitioner

## 2018-11-07 VITALS — BP 136/84 | HR 94 | Temp 98.7°F | Wt >= 6400 oz

## 2018-11-07 DIAGNOSIS — Z23 Encounter for immunization: Secondary | ICD-10-CM | POA: Diagnosis not present

## 2018-11-07 DIAGNOSIS — I5033 Acute on chronic diastolic (congestive) heart failure: Secondary | ICD-10-CM

## 2018-11-07 DIAGNOSIS — J9611 Chronic respiratory failure with hypoxia: Secondary | ICD-10-CM | POA: Diagnosis not present

## 2018-11-07 DIAGNOSIS — E875 Hyperkalemia: Secondary | ICD-10-CM | POA: Diagnosis not present

## 2018-11-07 DIAGNOSIS — I509 Heart failure, unspecified: Secondary | ICD-10-CM | POA: Diagnosis not present

## 2018-11-07 LAB — CBC WITH DIFFERENTIAL/PLATELET
Basophils Absolute: 0.2 10*3/uL — ABNORMAL HIGH (ref 0.0–0.1)
Basophils Relative: 1.5 % (ref 0.0–3.0)
Eosinophils Absolute: 0 10*3/uL (ref 0.0–0.7)
Eosinophils Relative: 0 % (ref 0.0–5.0)
HEMATOCRIT: 39.7 % (ref 36.0–46.0)
HEMOGLOBIN: 12.3 g/dL (ref 12.0–15.0)
LYMPHS PCT: 6.1 % — AB (ref 12.0–46.0)
Lymphs Abs: 0.8 10*3/uL (ref 0.7–4.0)
MCHC: 31 g/dL (ref 30.0–36.0)
MCV: 77.4 fl — ABNORMAL LOW (ref 78.0–100.0)
Monocytes Absolute: 0.4 10*3/uL (ref 0.1–1.0)
Monocytes Relative: 2.8 % — ABNORMAL LOW (ref 3.0–12.0)
Neutro Abs: 11.4 10*3/uL — ABNORMAL HIGH (ref 1.4–7.7)
Neutrophils Relative %: 89.6 % — ABNORMAL HIGH (ref 43.0–77.0)
Platelets: 299 10*3/uL (ref 150.0–400.0)
RBC: 5.13 Mil/uL — ABNORMAL HIGH (ref 3.87–5.11)
RDW: 22.5 % — ABNORMAL HIGH (ref 11.5–15.5)

## 2018-11-07 LAB — BASIC METABOLIC PANEL
BUN: 18 mg/dL (ref 6–23)
CO2: 24 mEq/L (ref 19–32)
Calcium: 10.1 mg/dL (ref 8.4–10.5)
Chloride: 99 mEq/L (ref 96–112)
Creatinine, Ser: 0.74 mg/dL (ref 0.40–1.20)
GFR: 105.54 mL/min (ref >60.00)
Glucose, Bld: 129 mg/dL — ABNORMAL HIGH (ref 70–99)
Potassium: 5.3 mEq/L — ABNORMAL HIGH (ref 3.5–5.1)
Sodium: 135 mEq/L (ref 135–145)

## 2018-11-07 NOTE — Assessment & Plan Note (Signed)
Patient is doing well since discharge from the hospital.  They continue O2 at 2 L nasal cannula with exertion as needed to keep O2 sats above 88%.  Patient Instructions  Continue current medications May use O2 with exertion Keep upcoming appointment with cardiology Low sodium diet Weigh daily Stay active Follow up with Dr. Elsworth Soho at his first available appointment in around 4 weeks or sooner if needed

## 2018-11-07 NOTE — Progress Notes (Signed)
Subjective:    Patient ID: [name], female    DOB: 1966-05-02, 53 y.o.   MRN: [id number]  HPI  53 year old female who  has a past medical history of Anemia, Arthritis, CHF (congestive heart failure) ([place]), Chronic hyperventilation syndrome, COPD (chronic obstructive pulmonary disease) ([place]), Gallstones, GERD (gastroesophageal reflux disease), H/O hiatal hernia, Hypertension, Hypothyroidism, Kidney stones, Morbid obesity ([place]), Pneumonia, Sarcoidosis, Seasonal allergies, and Sleep apnea.  She presents to the office today for TCM visit   Admit Date: 10/26/2018 Discharge Date:10/29/2018  Resented to the emergency room on 10/26/2018 due to progressively worsening dyspnea on exertion and approximately 19 pound weight gain with increasing leg edema over the last 3 weeks despite increasing home dose of Lasix.  She was seen by pulmonary and referred to the ED for IV diuretics.  She was admitted for acute on chronic diastolic CHF.   He was treated with IV Lasix milligrams every 12 hours and lost over 17 pounds while in the hospital.  Discharged on increased dose of Lasix 80 mg twice a day until seen by cardiology for follow-up.  Her follow-up with cardiology as scheduled for next week.  Today in the office she reports that she is feeling much better since discharge.  At home her weights have been stable and she denies any shortness of breath, chest pain, or worsening edema.  She does report that she is unable to take 80 mg of Lasix twice daily due to constantly having to use the restroom.  She has decreased her dose to 40 mg twice daily but has continued the potassium supplement of 40 mEq daily.  Wt Readings from Last 3 Encounters:  11/07/18 (!) 487 lb (220.9 kg)  11/07/18 (!) 487 lb (220.9 kg)  10/29/18 (!) 493 lb 8 oz (223.9 kg)    Review of Systems  Constitutional: Negative.   Respiratory: Negative.   Cardiovascular: Negative.   Gastrointestinal: Negative.   Endocrine:  Negative.   Genitourinary: Positive for frequency.  Musculoskeletal: Negative.   Neurological: Negative.   All other systems reviewed and are negative.  Past Medical History:  Diagnosis Date  . Anemia   . Arthritis    hands, shoulders, no meds  . CHF (congestive heart failure) (HCC)    EF60-65%  . Chronic hyperventilation syndrome    w/ obesity tx with albuterol inhaler and oxygen 2L  . COPD (chronic obstructive pulmonary disease) ([place])    uses oxygen 2 L  . Gallstones   . GERD (gastroesophageal reflux disease)    diet controlled - no meds  . H/O hiatal hernia   . Hypertension   . Hypothyroidism   . Kidney stones   . Morbid obesity ([place])   . Pneumonia    hoispitalized in 08/2011  . Sarcoidosis   . Seasonal allergies   . Sleep apnea    uses CPAP machine     Social History   Socioeconomic History  . Marital status: Single    Spouse name: Not on file  . Number of children: Not on file  . Years of education: Not on file  . Highest education level: Not on file  Occupational History  . Occupation: unemployeed  Social Needs  . Financial resource strain: Not on file  . Food insecurity:    Worry: Not on file    Inability: Not on file  . Transportation needs:    Medical: Not on file    Non-medical: Not on file  Tobacco Use  .  Smoking status: Never Smoker  . Smokeless tobacco: Never Used  Substance and Sexual Activity  . Alcohol use: Yes    Comment: occasionally  . Drug use: No  . Sexual activity: Never    Birth control/protection: None  Lifestyle  . Physical activity:    Days per week: Not on file    Minutes per session: Not on file  . Stress: Not on file  Relationships  . Social connections:    Talks on phone: Not on file    Gets together: Not on file    Attends religious service: Not on file    Active member of club or organization: Not on file    Attends meetings of clubs or organizations: Not on file    Relationship status: Not on file  . Intimate  partner violence:    Fear of current or ex partner: Not on file    Emotionally abused: Not on file    Physically abused: Not on file    Forced sexual activity: Not on file  Other Topics Concern  . Not on file  Social History Narrative   Son lives with patient.   Divorced for eight or nine years   Works from home for [employer].     Past Surgical History:  Procedure Laterality Date  . [place]   x 1  . CHOLECYSTECTOMY  2000  . DILATION AND CURETTAGE OF UTERUS N/A 08/09/2016   Procedure: DILATATION AND CURETTAGE;  Surgeon: Everitt Amber, MD;  Location: [facility];  Service: Gynecology;  Laterality: N/A;  . HYSTEROSCOPY W/D&C  12/27/2011   Procedure: DILATATION AND CURETTAGE /HYSTEROSCOPY;  Surgeon: Maeola Sarah. Landry Mellow, MD;  Location: [facility];  Service: Gynecology;;  . I and D of abcess  05/2011  . INTRAUTERINE DEVICE (IUD) INSERTION N/A 08/09/2016   Procedure: INTRAUTERINE DEVICE (IUD) INSERTION;  Surgeon: Everitt Amber, MD;  Location: [facility];  Service: Gynecology;  Laterality: N/A;  . LUNG BIOPSY    . uterine abletion      Family History  Problem Relation Age of Onset  . Diabetes Father   . Cancer Father 37       colon cancer   . Diabetes Brother   . Deep vein thrombosis Mother   . Aneurysm Sister        d/o brain aneurysm    No Known Allergies  Current Outpatient Medications on File Prior to Visit  Medication Sig Dispense Refill  . albuterol (PROAIR HFA) 108 (90 Base) MCG/ACT inhaler Inhale 1-2 puffs into the lungs every 6 (six) hours as needed for wheezing or shortness of breath.    . cetirizine (ZYRTEC) 10 MG tablet Take 10 mg by mouth at bedtime as needed for allergies.    . cholecalciferol (VITAMIN D) 400 UNITS TABS tablet Take 400 Units by mouth daily.    Marland Kitchen Coral Calcium 1000 (390 CA) MG TABS Take 1,000 mg by mouth daily.     . diclofenac sodium (VOLTAREN) 1 % GEL Apply 2 g topically daily as needed (inflammation).   6  . diphenhydrAMINE (BENADRYL) 25 mg capsule Take 25  mg by mouth every 6 (six) hours as needed (fever).    . famotidine (PEPCID) 40 MG tablet Take 1 tablet (40 mg total) by mouth 2 (two) times daily. 60 tablet 0  . folic acid (FOLVITE) 1 MG tablet Take 1 mg by mouth daily.  3  . furosemide (LASIX) 40 MG tablet Take 2 tablets (80 mg total) by  mouth 2 (two) times daily. 120 tablet 0  . HYDROcodone-acetaminophen (NORCO) 10-325 MG tablet Take 1 tablet by mouth every 6 (six) hours as needed (pain). 30 tablet 0  . ketorolac (ACULAR) 0.5 % ophthalmic solution     . losartan (COZAAR) 100 MG tablet TAKE 1 TABLET (100 MG TOTAL) DAILY BY MOUTH. 90 tablet 0  . methotrexate 250 MG/10ML injection once a week. Friday  3  . metoprolol succinate (TOPROL-XL) 25 MG 24 hr tablet Take 1 tablet (25 mg total) by mouth daily. Needs physical for future refills. 90 tablet 0  . NON FORMULARY 2 liter of oxygen    . potassium chloride SA (K-DUR,KLOR-CON) 20 MEQ tablet Take 2 tablets (40 mEq total) by mouth daily. 60 tablet 0  . predniSONE (DELTASONE) 10 MG tablet Take 1 tablet (10 mg total) by mouth daily. 10 tablet 0  . triamcinolone acetonide (KENALOG) 40 MG/ML injection Inject 40 mg into the muscle every 3 (three) months. For knee pain    . triamcinolone ointment (KENALOG) 0.1 % Apply 1 application topically See admin instructions. Applies twice a day as needed for break out of Sarcoidosis    . vitamin B-12 1000 MCG tablet Take 1 tablet (1,000 mcg total) by mouth daily. 30 tablet 0  . DUREZOL 0.05 % EMUL INSTILL 1 DROP INTO LEFT EYE 3 TIMES A DAY AS DIRECTED  1  . moxifloxacin (VIGAMOX) 0.5 % ophthalmic solution INSTILL 1 DROP INTO LEFT EYE 4 TIMES A DAY AS DIRECTED  1   No current facility-administered medications on file prior to visit.     BP 136/84   Pulse 94   Temp 98.7 F (37.1 C)   Wt (!) 487 lb (220.9 kg)   SpO2 95%   BMI 79.81 kg/m       Objective:   Physical Exam Vitals signs and nursing note reviewed.  Constitutional:      Appearance: Normal  appearance. She is obese.  HENT:     Right Ear: Tympanic membrane, ear canal and external ear normal. There is no impacted cerumen.     Left Ear: Tympanic membrane, ear canal and external ear normal. There is no impacted cerumen.     Nose: Nose normal. No congestion or rhinorrhea.     Mouth/Throat:     Mouth: Mucous membranes are moist.     Pharynx: Oropharynx is clear.  Eyes:     Extraocular Movements: Extraocular movements intact.     Conjunctiva/sclera: Conjunctivae normal.     Pupils: Pupils are equal, round, and reactive to light.  Cardiovascular:     Rate and Rhythm: Normal rate and regular rhythm.     Pulses: Normal pulses.     Heart sounds: Normal heart sounds.  Pulmonary:     Effort: Pulmonary effort is normal.     Breath sounds: Normal breath sounds.  Musculoskeletal:     Right lower leg: Edema present.     Left lower leg: Edema present.  Neurological:     Mental Status: She is alert.  Psychiatric:        Mood and Affect: Mood normal.        Behavior: Behavior normal.        Thought Content: Thought content normal.        Judgment: Judgment normal.       Assessment & Plan:  1. Acute on chronic congestive heart failure, unspecified heart failure type ([place]) -Fuses to take 80 mg of Lasix twice daily.  She  will continue on 40 mg twice daily until seen by cardiology.  Return precautions reviewed with the patient - Basic Metabolic Panel - CBC with Differential/Platelet  2. Chronic respiratory failure with hypoxia (HCC)  - Basic Metabolic Panel - CBC with Differential/Platelet  3. Need for prophylactic vaccination and inoculation against influenza  - Flu Vaccine QUAD 6+ mos PF IM (Fluarix Quad PF)  4. Hyperkalemia -Likely decrease potassium dose - Basic Metabolic Panel - CBC with Differential/Platelet  [name], NP

## 2018-11-07 NOTE — Assessment & Plan Note (Signed)
Patient Instructions  Continue current medications May use O2 with exertion Keep upcoming appointment with cardiology Low sodium diet Weigh daily Stay active Follow up with Dr. Elsworth Soho at his first available appointment in around 4 weeks or sooner if needed \

## 2018-11-07 NOTE — Progress Notes (Signed)
@Patient  ID: [name], female    DOB: 1966-06-25, 53 y.o.   MRN: [id number]  Chief Complaint  Patient presents with  . Hospitalization Follow-up    Referring provider: [name], NP  HPI 53 year old female patient managed in our office for obstructive sleep apnea and chronic respiratory failure. Presumed sarcoid. Morbidly obese with a BMI of 79. Inflammatory arthritis with positive ANA and follows with rheumatology (August). Maintained on 20 mg of injectable methotrexate. PMH includes CHF and hypertension  Tests/Significant events: 10/2014 PFTs-FVC 55%, DLCO 44%  Adm 12/2014 for acute resp failure secondary to CHF and PNA superimposed upon underlying OSA/OHS, sarcoidosis, and anemia -required 2 units PRBC for hemoglobin 6.7 CT angiogram chest negative for pulmonary embolus-Revealed scattered opacities bilateral   OV 11/07/18 - hospital admission 10/26/18 - 10/29/18 Patient presents today for hospital follow-up.  She was seen by me on 10/26/2018 advised to go to the ED for fluid overload.  She was admitted to the hospital for acute on chronic diastolic CHF.  She was treated with IV Lasix 80 mg every 12 hours and lost over 17 pounds while in the hospital.  Discharged on increased dose of Lasix 80 mg twice daily until seen by cardiology for follow-up.  Her follow-up with cardiology as scheduled for next week.  Followed up with her PCP this a.m. and had labs checked.  She states that she is feeling much better since discharge.  Her weight has been stable.  Denies any shortness of breath, chest pain, edema.   No Known Allergies  Immunization History  Administered Date(s) Administered  . Influenza Split 08/22/2011  . Influenza Whole 07/31/2012  . Influenza,inj,Quad PF,6+ Mos 09/16/2013, 07/11/2014, 07/11/2015, 09/04/2017, 11/07/2018  . Influenza-Unspecified 08/18/2016  . Pneumococcal-Unspecified 08/01/2011    Past Medical History:  Diagnosis Date  . Anemia    . Arthritis    hands, shoulders, no meds  . CHF (congestive heart failure) (HCC)    EF60-65%  . Chronic hyperventilation syndrome    w/ obesity tx with albuterol inhaler and oxygen 2L  . COPD (chronic obstructive pulmonary disease) ([place])    uses oxygen 2 L  . Gallstones   . GERD (gastroesophageal reflux disease)    diet controlled - no meds  . H/O hiatal hernia   . Hypertension   . Hypothyroidism   . Kidney stones   . Morbid obesity ([place])   . Pneumonia    hoispitalized in 08/2011  . Sarcoidosis   . Seasonal allergies   . Sleep apnea    uses CPAP machine     Tobacco History: Social History   Tobacco Use  Smoking Status Never Smoker  Smokeless Tobacco Never Used   Counseling given: Yes   Outpatient Encounter Medications as of 11/07/2018  Medication Sig  . albuterol (PROAIR HFA) 108 (90 Base) MCG/ACT inhaler Inhale 1-2 puffs into the lungs every 6 (six) hours as needed for wheezing or shortness of breath.  . cetirizine (ZYRTEC) 10 MG tablet Take 10 mg by mouth at bedtime as needed for allergies.  . cholecalciferol (VITAMIN D) 400 UNITS TABS tablet Take 400 Units by mouth daily.  Marland Kitchen Coral Calcium 1000 (390 CA) MG TABS Take 1,000 mg by mouth daily.   . diclofenac sodium (VOLTAREN) 1 % GEL Apply 2 g topically daily as needed (inflammation).   . diphenhydrAMINE (BENADRYL) 25 mg capsule Take 25 mg by mouth every 6 (six) hours as needed (fever).  . DUREZOL 0.05 % EMUL INSTILL 1 DROP INTO  LEFT EYE 3 TIMES A DAY AS DIRECTED  . famotidine (PEPCID) 40 MG tablet Take 1 tablet (40 mg total) by mouth 2 (two) times daily.  . folic acid (FOLVITE) 1 MG tablet Take 1 mg by mouth daily.  . furosemide (LASIX) 40 MG tablet Take 2 tablets (80 mg total) by mouth 2 (two) times daily.  Marland Kitchen HYDROcodone-acetaminophen (NORCO) 10-325 MG tablet Take 1 tablet by mouth every 6 (six) hours as needed (pain).  Marland Kitchen ketorolac (ACULAR) 0.5 % ophthalmic solution   . losartan (COZAAR) 100 MG tablet TAKE 1 TABLET  (100 MG TOTAL) DAILY BY MOUTH.  . methotrexate 250 MG/10ML injection once a week. Friday  . metoprolol succinate (TOPROL-XL) 25 MG 24 hr tablet Take 1 tablet (25 mg total) by mouth daily. Needs physical for future refills.  . moxifloxacin (VIGAMOX) 0.5 % ophthalmic solution INSTILL 1 DROP INTO LEFT EYE 4 TIMES A DAY AS DIRECTED  . NON FORMULARY 2 liter of oxygen  . potassium chloride SA (K-DUR,KLOR-CON) 20 MEQ tablet Take 2 tablets (40 mEq total) by mouth daily.  . predniSONE (DELTASONE) 10 MG tablet Take 1 tablet (10 mg total) by mouth daily.  Marland Kitchen triamcinolone acetonide (KENALOG) 40 MG/ML injection Inject 40 mg into the muscle every 3 (three) months. For knee pain  . triamcinolone ointment (KENALOG) 0.1 % Apply 1 application topically See admin instructions. Applies twice a day as needed for break out of Sarcoidosis  . vitamin B-12 1000 MCG tablet Take 1 tablet (1,000 mcg total) by mouth daily.   No facility-administered encounter medications on file as of 11/07/2018.      Review of Systems  Review of Systems  Constitutional: Negative.  Negative for chills and fever.  HENT: Negative.   Respiratory: Negative for cough, shortness of breath and wheezing.   Cardiovascular: Negative.  Negative for chest pain, palpitations and leg swelling.  Gastrointestinal: Negative.   Allergic/Immunologic: Negative.   Neurological: Negative.   Psychiatric/Behavioral: Negative.        Physical Exam  BP 130/68 (BP Location: Left Arm, Patient Position: Sitting, Cuff Size: Normal)   Pulse (!) 108   Ht 5\' 5"  (1.651 m)   Wt (!) 487 lb (220.9 kg)   SpO2 90%   BMI 81.04 kg/m   Wt Readings from Last 5 Encounters:  11/07/18 (!) 487 lb (220.9 kg)  11/07/18 (!) 487 lb (220.9 kg)  10/29/18 (!) 493 lb 8 oz (223.9 kg)  10/26/18 (!) 511 lb 9.6 oz (232.1 kg)  05/17/18 (!) 477 lb (216.4 kg)     Physical Exam Vitals signs and nursing note reviewed.  Constitutional:      General: She is not in acute  distress.    Appearance: She is well-developed.  Cardiovascular:     Rate and Rhythm: Normal rate and regular rhythm.  Pulmonary:     Effort: Pulmonary effort is normal. No respiratory distress.     Breath sounds: Normal breath sounds. No wheezing or rhonchi.  Musculoskeletal:        General: No swelling.  Neurological:     Mental Status: She is alert and oriented to person, place, and time.       Imaging: Dg Chest 2 View  Result Date: 10/26/2018 CLINICAL DATA:  Shortness of breath EXAM: CHEST - 2 VIEW COMPARISON:  Chest CT 04/17/2018 FINDINGS: Hazy opacities within both lungs. Moderate cardiomegaly. No pleural effusion or pneumothorax. No focal consolidation. IMPRESSION: Cardiomegaly and mild pulmonary edema. Electronically Signed   By: Lennette Bihari  [place] M.D.   On: 10/26/2018 22:26     Assessment & Plan:   Acute on chronic diastolic CHF (congestive heart failure) ([place]) Patient is doing well since discharge from the hospital.  They continue O2 at 2 L nasal cannula with exertion as needed to keep O2 sats above 88%.  Patient Instructions  Continue current medications May use O2 with exertion Keep upcoming appointment with cardiology Low sodium diet Weigh daily Stay active Follow up with Dr. Elsworth Soho at his first available appointment in around 4 weeks or sooner if needed    Chronic respiratory failure [facility]) Patient Instructions  Continue current medications May use O2 with exertion Keep upcoming appointment with cardiology Low sodium diet Weigh daily Stay active Follow up with Dr. Elsworth Soho at his first available appointment in around 4 weeks or sooner if needed \     Fenton Foy, NP 11/07/2018

## 2018-11-07 NOTE — Patient Instructions (Addendum)
Continue current medications May use O2 with exertion Keep upcoming appointment with cardiology Low sodium diet Weigh daily Stay active Follow up with Dr. Elsworth Soho at his first available appointment in around 4 weeks or sooner if needed

## 2018-11-13 ENCOUNTER — Other Ambulatory Visit: Payer: Self-pay

## 2018-11-13 ENCOUNTER — Other Ambulatory Visit: Payer: Self-pay | Admitting: Family Medicine

## 2018-11-13 MED ORDER — FAMOTIDINE 40 MG PO TABS: 40.0000 mg | ORAL_TABLET | Freq: Two times a day (BID) | ORAL | 3 refills | Status: DC

## 2018-11-13 NOTE — Patient Outreach (Addendum)
[facility]) Care Management  11/13/2018  [name] 01-Mar-1966 [id number]  Transition of care  Referral date: 11/13/18  Referral source: [employer] referral Upon chart review patient discharged from hospital on 10/29/18 Insurance: [employer]   Transition of care will be completed by patient's primary care provider office.  Telephone call to patient regarding [employer] referral. HIPAA verified with patient. Explained reason for call and discussed and offered [facility] care management services. Patient declined. RNCM offered to mail patient [facility] care management brochure/ magnet 24 nurse advise line number). Patient verbally agreed.    PLAN: RNCM will close patient due to refusal of services.  RNCM will mail patient [facility] brochure/ magnet as discussed  RNCM will send patients primary MD closure letter  Quinn Plowman RN,BSN,CCM [facility] Telephonic  [phone]

## 2018-11-19 ENCOUNTER — Encounter: Payer: Self-pay | Admitting: Cardiology

## 2018-11-19 ENCOUNTER — Ambulatory Visit (INDEPENDENT_AMBULATORY_CARE_PROVIDER_SITE_OTHER): Payer: Medicare HMO | Admitting: Cardiology

## 2018-11-19 VITALS — BP 140/66 | HR 85 | Ht 65.0 in | Wt >= 6400 oz

## 2018-11-19 DIAGNOSIS — I5033 Acute on chronic diastolic (congestive) heart failure: Secondary | ICD-10-CM | POA: Diagnosis not present

## 2018-11-19 DIAGNOSIS — J9611 Chronic respiratory failure with hypoxia: Secondary | ICD-10-CM

## 2018-11-19 DIAGNOSIS — I5032 Chronic diastolic (congestive) heart failure: Secondary | ICD-10-CM | POA: Diagnosis not present

## 2018-11-19 DIAGNOSIS — Z79899 Other long term (current) drug therapy: Secondary | ICD-10-CM

## 2018-11-19 MED ORDER — FUROSEMIDE 40 MG PO TABS: 120.0000 mg | ORAL_TABLET | Freq: Two times a day (BID) | ORAL | 3 refills | Status: DC

## 2018-11-19 NOTE — Progress Notes (Signed)
Cardiology Office Note:    Date:  11/19/2018   ID:  [name], DOB [birth year], MRN [id number]  PCP:  Kim Peng, [name]  Cardiologist:  Kim Furbish, [name]  Electrophysiologist:  None   Referring [name]: Kim Peng, [name]     History of Present Illness:    [name] is a 53 y.o. female here for the follow-up of diastolic heart failure, morbid obesity, hypothyroidism, hypertension, sarcoidosis with  March 2019 seen in consultation.  She was last seen by me in 2016 and felt dyspneic secondary to morbid obesity.  Normal EF, normal BMP.  Right heart cath was discussed but pulmonary pressures are likely elevated to body habitus.  Felt like she was released way to soon. Diarrhea, Fever.   Echocardiogram 01/11/18 Study Conclusions - Left ventricle: The cavity size was normal. Wall thickness was increased in a pattern of mild LVH. Systolic function was normal. The estimated ejection fraction was in the range of 55% to 60%. Wall motion was normal; there were no regional wall motion abnormalities. - Aortic valve: Trileaflet; mildly calcified leaflets. - Right ventricle: The cavity size was mildly dilated. - Pulmonary arteries: Systolic pressure was mildly increased. PA peak pressure: 35 mm Hg (S). - Pericardium, extracardiac: A trivial pericardial effusion was identified.  BMP at that time once again was low underrepresented because of morbid obesity component troponins were normal. EF was normal. Fluid restriction salt restriction.  Lasix.  [place] Provencal, [name] on 11/07/2018.  In their note, seems like she was refusing to take 80 mg of Lasix twice a day and she was continued on 40 mg twice a day.  Past Medical History:  Diagnosis Date  . Anemia   . Arthritis    hands, shoulders, no meds  . CHF (congestive heart failure) (HCC)    EF60-65%  . Chronic hyperventilation syndrome    w/ obesity tx with albuterol inhaler and oxygen 2L  . COPD (chronic  obstructive pulmonary disease) ([place])    uses oxygen 2 L  . Gallstones   . GERD (gastroesophageal reflux disease)    diet controlled - no meds  . H/O hiatal hernia   . Hypertension   . Hypothyroidism   . Kidney stones   . Morbid obesity ([place])   . Pneumonia    hoispitalized in 08/2011  . Sarcoidosis   . Seasonal allergies   . Sleep apnea    uses CPAP machine     Past Surgical History:  Procedure Laterality Date  . [place]   x 1  . CHOLECYSTECTOMY  2000  . DILATION AND CURETTAGE OF UTERUS N/A 08/09/2016   Procedure: DILATATION AND CURETTAGE;  Surgeon: Everitt Amber, [name];  Location: [facility];  Service: Gynecology;  Laterality: N/A;  . HYSTEROSCOPY W/D&C  12/27/2011   Procedure: DILATATION AND CURETTAGE /HYSTEROSCOPY;  Surgeon: Maeola Sarah. Landry Mellow, [name];  Location: [facility];  Service: Gynecology;;  . I and D of abcess  05/2011  . INTRAUTERINE DEVICE (IUD) INSERTION N/A 08/09/2016   Procedure: INTRAUTERINE DEVICE (IUD) INSERTION;  Surgeon: Everitt Amber, [name];  Location: [facility];  Service: Gynecology;  Laterality: N/A;  . LUNG BIOPSY    . uterine abletion      Current Medications: Current Meds  Medication Sig  . albuterol (PROAIR HFA) 108 (90 Base) MCG/ACT inhaler Inhale 1-2 puffs into the lungs every 6 (six) hours as needed for wheezing or shortness of breath.  . cetirizine (ZYRTEC) 10 MG tablet Take 10 mg by  mouth at bedtime as needed for allergies.  . cholecalciferol (VITAMIN D) 400 UNITS TABS tablet Take 400 Units by mouth daily.  Marland Kitchen Coral Calcium 1000 (390 CA) MG TABS Take 1,000 mg by mouth daily.   . diphenhydrAMINE (BENADRYL) 25 mg capsule Take 25 mg by mouth every 6 (six) hours as needed (fever).  . DUREZOL 0.05 % EMUL INSTILL 1 DROP INTO LEFT EYE 3 TIMES A DAY AS DIRECTED  . famotidine (PEPCID) 40 MG tablet Take 1 tablet (40 mg total) by mouth 2 (two) times daily.  . folic acid (FOLVITE) 1 MG tablet Take 1 mg by mouth daily.  . furosemide (LASIX) 40 MG tablet Take 3 tablets (120  mg total) by mouth 2 (two) times daily.  Marland Kitchen HYDROcodone-acetaminophen (NORCO) 10-325 MG tablet Take 1 tablet by mouth every 6 (six) hours as needed (pain).  Marland Kitchen ketorolac (ACULAR) 0.5 % ophthalmic solution   . losartan (COZAAR) 100 MG tablet TAKE 1 TABLET (100 MG TOTAL) DAILY BY MOUTH.  . methotrexate 250 MG/10ML injection once a week. Friday  . metoprolol succinate (TOPROL-XL) 25 MG 24 hr tablet Take 1 tablet (25 mg total) by mouth daily. Needs physical for future refills.  . NON FORMULARY 2 liter of oxygen  . potassium chloride SA (K-DUR,KLOR-CON) 20 MEQ tablet Take 2 tablets (40 mEq total) by mouth daily. (Patient taking differently: Take 20 mEq by mouth daily. )  . predniSONE (DELTASONE) 10 MG tablet Take 1 tablet (10 mg total) by mouth daily.  Marland Kitchen triamcinolone acetonide (KENALOG) 40 MG/ML injection Inject 40 mg into the muscle every 3 (three) months. For knee pain  . triamcinolone ointment (KENALOG) 0.1 % Apply 1 application topically See admin instructions. Applies twice a day as needed for break out of Sarcoidosis  . vitamin B-12 1000 MCG tablet Take 1 tablet (1,000 mcg total) by mouth daily.  . [DISCONTINUED] furosemide (LASIX) 40 MG tablet Take 40 mg by mouth 2 (two) times daily.     Allergies:   Patient has no known allergies.   Social History   Socioeconomic History  . Marital status: Single    Spouse name: Not on file  . Number of children: Not on file  . Years of education: Not on file  . Highest education level: Not on file  Occupational History  . Occupation: unemployeed  Social Needs  . Financial resource strain: Not on file  . Food insecurity:    Worry: Not on file    Inability: Not on file  . Transportation needs:    Medical: Not on file    Non-medical: Not on file  Tobacco Use  . Smoking status: Never Smoker  . Smokeless tobacco: Never Used  Substance and Sexual Activity  . Alcohol use: Yes    Comment: occasionally  . Drug use: No  . Sexual activity: Never     Birth control/protection: None  Lifestyle  . Physical activity:    Days per week: Not on file    Minutes per session: Not on file  . Stress: Not on file  Relationships  . Social connections:    Talks on phone: Not on file    Gets together: Not on file    Attends religious service: Not on file    Active member of club or organization: Not on file    Attends meetings of clubs or organizations: Not on file    Relationship status: Not on file  Other Topics Concern  . Not on file  Social  History Narrative   [name] lives with patient.   Divorced for eight or nine years   Works from home for [employer].      Family History: The patient's family history includes Aneurysm in her sister; Cancer (age of onset: 72) in her father; Deep vein thrombosis in her mother; Diabetes in her brother and father.  ROS:   Please see the history of present illness.     All other systems reviewed and are negative.  EKGs/Labs/Other Studies Reviewed:    The following studies were reviewed today: Prior hospital records lab work EKG  EKG:  EKG is not ordered today.    Recent Labs: 10/26/2018: B Natriuretic Peptide 52.8 10/27/2018: ALT 10; TSH 2.967 10/28/2018: Magnesium 1.9 11/07/2018: BUN 18; Creatinine, Ser 0.74; Hemoglobin 12.3; Platelets 299.0; Potassium 5.3; Sodium 135  Recent Lipid Panel    Component Value Date/Time   CHOL 261 (H) 03/02/2017 0817   TRIG 130.0 03/02/2017 0817   HDL 63.20 03/02/2017 0817   CHOLHDL 4 03/02/2017 0817   VLDL 26.0 03/02/2017 0817   LDLCALC 172 (H) 03/02/2017 0817    Physical Exam:    VS:  BP 140/66   Pulse 85   Ht 5\' 5"  (1.651 m)   Wt (!) 501 lb 6.4 oz (227.4 kg)   SpO2 95%   BMI 83.44 kg/m     Wt Readings from Last 3 Encounters:  11/19/18 (!) 501 lb 6.4 oz (227.4 kg)  11/07/18 (!) 487 lb (220.9 kg)  11/07/18 (!) 487 lb (220.9 kg)     GEN: Morbidly obese well nourished, well developed in no acute distress HEENT: Normal NECK: No JVD; No carotid  bruits LYMPHATICS: No lymphadenopathy CARDIAC: RRR, no murmurs, rubs, gallops RESPIRATORY:  Clear to auscultation without rales, wheezing or rhonchi  ABDOMEN: Soft, non-tender, non-distended MUSCULOSKELETAL:  No edema; No deformity  SKIN: Warm and dry NEUROLOGIC:  Alert and oriented x 3 PSYCHIATRIC:  Normal affect   ASSESSMENT:    1. Acute on chronic diastolic CHF (congestive heart failure) ([place])   2. Long-term use of high-risk medication   3. Chronic diastolic heart failure ([place])   4. Chronic respiratory failure with hypoxia (HCC)    PLAN:    In order of problems listed above:  Chronic diastolic heart failure - Recent admission December 2019 with chronic respiratory failure home oxygen COPD obesity hypoventilation syndrome.  Had a 19 pound weight gain increasing leg edema.  IV diuretics was administered.  She was diuresed 11 L.  Lost about 20 pounds at admission.  Her weight is back up. -Her Lasix was increased to 80 mg twice a day for the ongoing week post discharge. -Since she is holding onto more fluid, we will increase her Lasix to 120 mg p.o. twice daily.  Repeat basic metabolic profile in 1 week.  Have her see Cecilie Kicks, [name] in 1 month.  Continue with fluid and salt restrictions.  Low-carb diet.  Morbid obesity - Continues to be the crux of many of her health issues.  BMI 83. -Continue to encourage weight loss.  She is at high risk.  Sarcoidosis -Methotrexate prednisone.  Per primary team.  Medication Adjustments/Labs and Tests Ordered: Current medicines are reviewed at length with the patient today.  Concerns regarding medicines are outlined above.  Orders Placed This Encounter  Procedures  . Basic metabolic panel   Meds ordered this encounter  Medications  . furosemide (LASIX) 40 MG tablet    Sig: Take 3 tablets (120 mg  total) by mouth 2 (two) times daily.    Dispense:  180 tablet    Refill:  3    Patient Instructions  Medication Instructions:  Please  increase your Lasix (Furosemide) to 120 mg twice a day. Continue all other medications as listed.  If you need a refill on your cardiac medications before your next appointment, please call your pharmacy.   Lab work: Please have blood work in 1 week. (BMP)  If you have labs (blood work) drawn today and your tests are completely normal, you will receive your results only by: Marland Kitchen MyChart Message (if you have MyChart) OR . A paper copy in the mail If you have any lab test that is abnormal or we need to change your treatment, we will call you to review the results.  Follow-Up: At [facility], you and your health needs are our priority.  As part of our continuing mission to provide you with exceptional heart care, we have created designated Provider Care Teams.  These Care Teams include your primary Cardiologist (physician) and Advanced Practice Providers (APPs -  Physician Assistants and Nurse Practitioners) who all work together to provide you with the care you need, when you need it. You will need a follow up appointment in 1 month with Cecilie Kicks, [name].  Please call our office 2 months in advance to schedule this appointment.  You may see Kim Furbish, [name] or one of the following Advanced Practice Providers on your designated Care Team:   Truitt Merle, [name] Cecilie Kicks, [name] . Kathyrn Drown, [name]  Thank you for choosing [facility]!!         Signed, Kim Furbish, [name]  11/19/2018 11:26 AM    [place]

## 2018-11-19 NOTE — Patient Instructions (Signed)
Medication Instructions:  Please increase your Lasix (Furosemide) to 120 mg twice a day. Continue all other medications as listed.  If you need a refill on your cardiac medications before your next appointment, please call your pharmacy.   Lab work: Please have blood work in 1 week. (BMP)  If you have labs (blood work) drawn today and your tests are completely normal, you will receive your results only by: Marland Kitchen MyChart Message (if you have MyChart) OR . A paper copy in the mail If you have any lab test that is abnormal or we need to change your treatment, we will call you to review the results.  Follow-Up: At [facility], you and your health needs are our priority.  As part of our continuing mission to provide you with exceptional heart care, we have created designated Provider Care Teams.  These Care Teams include your primary Cardiologist (physician) and Advanced Practice Providers (APPs -  Physician Assistants and Nurse Practitioners) who all work together to provide you with the care you need, when you need it. You will need a follow up appointment in 1 month with Cecilie Kicks, NP.  Please call our office 2 months in advance to schedule this appointment.  You may see Candee Furbish, MD or one of the following Advanced Practice Providers on your designated Care Team:   Truitt Merle, NP Cecilie Kicks, NP . Kathyrn Drown, NP  Thank you for choosing [facility]!!

## 2018-11-20 DIAGNOSIS — I509 Heart failure, unspecified: Secondary | ICD-10-CM | POA: Diagnosis not present

## 2018-11-20 DIAGNOSIS — G4730 Sleep apnea, unspecified: Secondary | ICD-10-CM | POA: Diagnosis not present

## 2018-11-20 DIAGNOSIS — I739 Peripheral vascular disease, unspecified: Secondary | ICD-10-CM | POA: Diagnosis not present

## 2018-11-20 DIAGNOSIS — D869 Sarcoidosis, unspecified: Secondary | ICD-10-CM | POA: Diagnosis not present

## 2018-11-20 DIAGNOSIS — I110 Hypertensive heart disease with heart failure: Secondary | ICD-10-CM | POA: Diagnosis not present

## 2018-11-20 DIAGNOSIS — Z008 Encounter for other general examination: Secondary | ICD-10-CM | POA: Diagnosis not present

## 2018-11-20 DIAGNOSIS — J9610 Chronic respiratory failure, unspecified whether with hypoxia or hypercapnia: Secondary | ICD-10-CM | POA: Diagnosis not present

## 2018-11-20 DIAGNOSIS — D509 Iron deficiency anemia, unspecified: Secondary | ICD-10-CM | POA: Diagnosis not present

## 2018-11-20 DIAGNOSIS — M069 Rheumatoid arthritis, unspecified: Secondary | ICD-10-CM | POA: Diagnosis not present

## 2018-11-20 DIAGNOSIS — J449 Chronic obstructive pulmonary disease, unspecified: Secondary | ICD-10-CM | POA: Diagnosis not present

## 2018-11-25 DIAGNOSIS — D869 Sarcoidosis, unspecified: Secondary | ICD-10-CM | POA: Diagnosis not present

## 2018-11-25 DIAGNOSIS — G4733 Obstructive sleep apnea (adult) (pediatric): Secondary | ICD-10-CM | POA: Diagnosis not present

## 2018-11-25 DIAGNOSIS — E662 Morbid (severe) obesity with alveolar hypoventilation: Secondary | ICD-10-CM | POA: Diagnosis not present

## 2018-11-25 DIAGNOSIS — E669 Obesity, unspecified: Secondary | ICD-10-CM | POA: Diagnosis not present

## 2018-11-25 DIAGNOSIS — R918 Other nonspecific abnormal finding of lung field: Secondary | ICD-10-CM | POA: Diagnosis not present

## 2018-11-25 DIAGNOSIS — R0602 Shortness of breath: Secondary | ICD-10-CM | POA: Diagnosis not present

## 2018-11-27 ENCOUNTER — Telehealth: Payer: Self-pay | Admitting: *Deleted

## 2018-11-27 ENCOUNTER — Other Ambulatory Visit: Payer: Self-pay | Admitting: Hematology

## 2018-11-27 NOTE — Telephone Encounter (Signed)
Notified of message below

## 2018-11-27 NOTE — Telephone Encounter (Signed)
I will schedule her lab and f/u in 1-2 weeks, thanks   Truitt Merle MD

## 2018-11-27 NOTE — Telephone Encounter (Signed)
Pt left a message stating she is extremely tired, feels like she needs iron.

## 2018-11-28 ENCOUNTER — Telehealth: Payer: Self-pay | Admitting: Hematology

## 2018-11-28 NOTE — Telephone Encounter (Signed)
Called patient to confirm appts per 1/29 sch message- pt is aware of appt date and time

## 2018-12-03 NOTE — Progress Notes (Signed)
[facility]   [phone] [phone]   Clinic Follow up Note   Patient Care Team: [name], NP as PCP - General (Family Medicine) Jerline Pain, MD as PCP - Cardiology (Cardiology) Wonda Horner, MD (Gastroenterology) Rigoberto Noel, MD as Consulting Physician (Pulmonary Disease) Gavin Pound, MD as Consulting Physician (Rheumatology) 12/03/2018  CHIEF COMPLAINT: F/u anemia   CURRENT THERAPY  iv Feraheme 510mg  as needed   INTERVAL HISTORY: [name] is a 53 y.o. female who is here for follow-up. Since last seen she has been admitted to the ED twice in 03/2018 and 09/2018 for SOB. Today, she is here alone. She is doing well. She has lost weight but her breathing has not improved. She still has a period and has a family history of late menopausal. She previously took oral iron but stopped because of abdominal discomfort.    Pertinent positives and negatives of review of systems are listed and detailed within the above HPI.  REVIEW OF SYSTEMS:   Constitutional: Denies fevers, chills or abnormal weight loss Eyes: Denies blurriness of vision Ears, nose, mouth, throat, and face: Denies mucositis or sore throat Respiratory: Denies cough, wheezes, (+) mild SOB Cardiovascular: Denies palpitation, chest discomfort or lower extremity swelling Gastrointestinal:  Denies nausea, heartburn or change in bowel habits Skin: Denies abnormal skin rashes Lymphatics: Denies new lymphadenopathy or easy bruising Neurological:Denies numbness, tingling or new weaknesses Behavioral/Psych: Mood is stable, no new changes  All other systems were reviewed with the patient and are negative.  MEDICAL HISTORY:  Past Medical History:  Diagnosis Date  . Anemia   . Arthritis    hands, shoulders, no meds  . CHF (congestive heart failure) (HCC)    EF60-65%  . Chronic hyperventilation syndrome    w/ obesity tx with albuterol inhaler and oxygen 2L  .  COPD (chronic obstructive pulmonary disease) ([place])    uses oxygen 2 L  . Gallstones   . GERD (gastroesophageal reflux disease)    diet controlled - no meds  . H/O hiatal hernia   . Hypertension   . Hypothyroidism   . Kidney stones   . Morbid obesity ([place])   . Pneumonia    hoispitalized in 08/2011  . Sarcoidosis   . Seasonal allergies   . Sleep apnea    uses CPAP machine     SURGICAL HISTORY: Past Surgical History:  Procedure Laterality Date  . [place]   x 1  . CHOLECYSTECTOMY  2000  . DILATION AND CURETTAGE OF UTERUS N/A 08/09/2016   Procedure: DILATATION AND CURETTAGE;  Surgeon: Everitt Amber, MD;  Location: [facility];  Service: Gynecology;  Laterality: N/A;  . HYSTEROSCOPY W/D&C  12/27/2011   Procedure: DILATATION AND CURETTAGE /HYSTEROSCOPY;  Surgeon: Maeola Sarah. Landry Mellow, MD;  Location: [facility];  Service: Gynecology;;  . I and D of abcess  05/2011  . INTRAUTERINE DEVICE (IUD) INSERTION N/A 08/09/2016   Procedure: INTRAUTERINE DEVICE (IUD) INSERTION;  Surgeon: Everitt Amber, MD;  Location: [facility];  Service: Gynecology;  Laterality: N/A;  . LUNG BIOPSY    . uterine abletion      I have reviewed the social history and family history with the patient and they are unchanged from previous note.  ALLERGIES:  has No Known Allergies.  MEDICATIONS:  Current Outpatient Medications  Medication Sig Dispense Refill  . albuterol (PROAIR HFA) 108 (90 Base) MCG/ACT inhaler Inhale 1-2 puffs into the lungs every 6 (six) hours as  needed for wheezing or shortness of breath.    . cetirizine (ZYRTEC) 10 MG tablet Take 10 mg by mouth at bedtime as needed for allergies.    . cholecalciferol (VITAMIN D) 400 UNITS TABS tablet Take 400 Units by mouth daily.    Marland Kitchen Coral Calcium 1000 (390 CA) MG TABS Take 1,000 mg by mouth daily.     . diphenhydrAMINE (BENADRYL) 25 mg capsule Take 25 mg by mouth every 6 (six) hours as needed (fever).    . DUREZOL 0.05 % EMUL INSTILL 1 DROP INTO LEFT EYE 3 TIMES A DAY  AS DIRECTED  1  . famotidine (PEPCID) 40 MG tablet Take 1 tablet (40 mg total) by mouth 2 (two) times daily. 694 tablet 3  . folic acid (FOLVITE) 1 MG tablet Take 1 mg by mouth daily.  3  . furosemide (LASIX) 40 MG tablet Take 3 tablets (120 mg total) by mouth 2 (two) times daily. 180 tablet 3  . HYDROcodone-acetaminophen (NORCO) 10-325 MG tablet Take 1 tablet by mouth every 6 (six) hours as needed (pain). 30 tablet 0  . ketorolac (ACULAR) 0.5 % ophthalmic solution     . losartan (COZAAR) 100 MG tablet TAKE 1 TABLET (100 MG TOTAL) DAILY BY MOUTH. 90 tablet 0  . methotrexate 250 MG/10ML injection once a week. Friday  3  . metoprolol succinate (TOPROL-XL) 25 MG 24 hr tablet Take 1 tablet (25 mg total) by mouth daily. Needs physical for future refills. 90 tablet 0  . NON FORMULARY 2 liter of oxygen    . potassium chloride SA (K-DUR,KLOR-CON) 20 MEQ tablet Take 2 tablets (40 mEq total) by mouth daily. (Patient taking differently: Take 20 mEq by mouth daily. ) 60 tablet 0  . predniSONE (DELTASONE) 10 MG tablet Take 1 tablet (10 mg total) by mouth daily. 10 tablet 0  . triamcinolone acetonide (KENALOG) 40 MG/ML injection Inject 40 mg into the muscle every 3 (three) months. For knee pain    . triamcinolone ointment (KENALOG) 0.1 % Apply 1 application topically See admin instructions. Applies twice a day as needed for break out of Sarcoidosis    . vitamin B-12 1000 MCG tablet Take 1 tablet (1,000 mcg total) by mouth daily. 30 tablet 0   No current facility-administered medications for this visit.     PHYSICAL EXAMINATION: ECOG PERFORMANCE STATUS: 3 - Symptomatic, >50% confined to bed  Vitals:   12/04/18 1414  BP: (!) 160/57  Pulse: 88  Resp: 18  Temp: 98.6 F (37 C)  SpO2: 95%   Filed Weights   12/04/18 1414  Weight: (!) 483 lb 11.2 oz (219.4 kg)    GENERAL:alert, no distress and comfortable SKIN: skin color, texture, turgor are normal, no rashes or significant lesions EYES: normal,  Conjunctiva are pink and non-injected, sclera clear OROPHARYNX:no exudate, no erythema and lips, buccal mucosa, and tongue normal  NECK: supple, thyroid normal size, non-tender, without nodularity LYMPH:  no palpable lymphadenopathy in the cervical, axillary or inguinal LUNGS: clear to auscultation and percussion with normal breathing effort HEART: regular rate & rhythm and no murmurs and no lower extremity edema ABDOMEN:abdomen soft, non-tender and normal bowel sounds Musculoskeletal:no cyanosis of digits and no clubbing  NEURO: alert & oriented x 3 with fluent speech, no focal motor/sensory deficits  LABORATORY DATA:  I have reviewed the data as listed CBC Latest Ref Rng & Units 12/04/2018 11/07/2018 10/28/2018  WBC 4.0 - 10.5 K/uL 11.0(H) 12.7 A Manual Differential was performed and is  consistent with the Automated Differential.(H) 9.7  Hemoglobin 12.0 - 15.0 g/dL 10.7(L) 12.3 10.6(L)  Hematocrit 36.0 - 46.0 % 36.4 39.7 36.1  Platelets 150 - 400 K/uL 294 299.0 196     CMP Latest Ref Rng & Units 11/07/2018 10/29/2018 10/28/2018  Glucose 70 - 99 mg/dL 129(H) 101(H) 96  BUN 6 - 23 mg/dL 18 25(H) 20  Creatinine 0.40 - 1.20 mg/dL 0.74 0.99 0.78  Sodium 135 - 145 mEq/L 135 142 139  Potassium 3.5 - 5.1 mEq/L 5.3(H) 3.9 3.5  Chloride 96 - 112 mEq/L 99 101 102  CO2 19 - 32 mEq/L 24 30 27   Calcium 8.4 - 10.5 mg/dL 10.1 8.5(L) 8.8(L)  Total Protein 6.5 - 8.1 g/dL - - -  Total Bilirubin 0.3 - 1.2 mg/dL - - -  Alkaline Phos 38 - 126 U/L - - -  AST 15 - 41 U/L - - -  ALT 0 - 44 U/L - - -   Results for MEGAHN, KILLINGS (MRN [id number]) as of 12/05/2018 21:46  Ref. Range 08/09/2018 07:57 12/04/2018 14:03  Iron Latest Ref Range: 41 - 142 ug/dL 18 (L) 22 (L)  UIBC Latest Ref Range: 120 - 384 ug/dL 332 355  TIBC Latest Ref Range: 236 - 444 ug/dL 350 378  Saturation Ratios Latest Ref Range: 21 - 57 % 5 (L) 6 (L)  Ferritin Latest Ref Range: 11 - 307 ng/mL 27 17    RADIOGRAPHIC STUDIES: I have  personally reviewed the radiological images as listed and agreed with the findings in the report. No results found.   ASSESSMENT & PLAN:  [name] is a 53 y.o. female with history of  1. Iron deficient anemia from Menorrhagia -Her multiple iron studies previously showed very low level of ferritin, serum iron level and transferrin saturation, her MCV is low, consistent with iron deficient anemia. -Her iron deficiency is likely from her menorrhagia -She is not very compliant with oral iron supplement, responded well to IV iron -she has tried IUD, but did not help much and it was removed, her menorrhagia has improved some since then   -She has required IV iron every 2-4 months in the past few years. She tolerated the infusions well. Oral Iron makes her nauseated so she plans to only proceed with IV iron.   -lab reviewed, her hemoglobin is 10.7 today, ferritin and serum iron level are low, will proceed with iron today and next week  - Lab every 8 weeks x4, iv iron if ferritin<50 or low iron level. -f/u in 6 months    2. Hypertension, pulmonary sarcomatosis, congestive heart failure morbid obesity -F/u with PCP - Admitted to the ED 04/16/18-04/20/18 for SOB, 10/26/2018 for SOB 10/19/2018 -Uses CPAP - On Lasix   3. Morbid obesity  -we discuss weight loss and I recommend her to see weight management clinic, she declined.    Plan  - Labs every 2 months, will set up IV iron if ferritin less than 50 or low iron level - F/u with me every 6 months   No problem-specific Assessment & Plan notes found for this encounter.   No orders of the defined types were placed in this encounter.  All questions were answered. The patient knows to call the clinic with any problems, questions or concerns. No barriers to learning was detected. I spent 15 minutes counseling the patient face to face. The total time spent in the appointment was 20 minutes and more than 50% was on  counseling and review  of test results  I, Manson Allan am acting as scribe for Dr. Truitt Merle.  I have reviewed the above documentation for accuracy and completeness, and I agree with the above.     Truitt Merle, MD 12/03/2018

## 2018-12-04 ENCOUNTER — Telehealth: Payer: Self-pay | Admitting: Hematology

## 2018-12-04 ENCOUNTER — Inpatient Hospital Stay: Payer: Medicare HMO | Attending: Hematology | Admitting: Hematology

## 2018-12-04 ENCOUNTER — Inpatient Hospital Stay: Payer: Medicare HMO

## 2018-12-04 VITALS — BP 127/52 | HR 77 | Temp 98.0°F | Resp 20

## 2018-12-04 VITALS — BP 160/57 | HR 88 | Temp 98.6°F | Resp 18 | Ht 65.0 in | Wt >= 6400 oz

## 2018-12-04 DIAGNOSIS — Z79899 Other long term (current) drug therapy: Secondary | ICD-10-CM

## 2018-12-04 DIAGNOSIS — D500 Iron deficiency anemia secondary to blood loss (chronic): Secondary | ICD-10-CM | POA: Insufficient documentation

## 2018-12-04 DIAGNOSIS — K219 Gastro-esophageal reflux disease without esophagitis: Secondary | ICD-10-CM | POA: Insufficient documentation

## 2018-12-04 DIAGNOSIS — N920 Excessive and frequent menstruation with regular cycle: Secondary | ICD-10-CM | POA: Diagnosis not present

## 2018-12-04 DIAGNOSIS — D869 Sarcoidosis, unspecified: Secondary | ICD-10-CM | POA: Insufficient documentation

## 2018-12-04 DIAGNOSIS — K449 Diaphragmatic hernia without obstruction or gangrene: Secondary | ICD-10-CM | POA: Diagnosis not present

## 2018-12-04 DIAGNOSIS — I509 Heart failure, unspecified: Secondary | ICD-10-CM

## 2018-12-04 DIAGNOSIS — Z87442 Personal history of urinary calculi: Secondary | ICD-10-CM | POA: Insufficient documentation

## 2018-12-04 DIAGNOSIS — E039 Hypothyroidism, unspecified: Secondary | ICD-10-CM | POA: Insufficient documentation

## 2018-12-04 DIAGNOSIS — Z791 Long term (current) use of non-steroidal anti-inflammatories (NSAID): Secondary | ICD-10-CM | POA: Diagnosis not present

## 2018-12-04 DIAGNOSIS — I110 Hypertensive heart disease with heart failure: Secondary | ICD-10-CM | POA: Diagnosis not present

## 2018-12-04 DIAGNOSIS — M1990 Unspecified osteoarthritis, unspecified site: Secondary | ICD-10-CM | POA: Diagnosis not present

## 2018-12-04 DIAGNOSIS — J449 Chronic obstructive pulmonary disease, unspecified: Secondary | ICD-10-CM | POA: Diagnosis not present

## 2018-12-04 DIAGNOSIS — G4730 Sleep apnea, unspecified: Secondary | ICD-10-CM | POA: Diagnosis not present

## 2018-12-04 LAB — CBC WITH DIFFERENTIAL (CANCER CENTER ONLY)
Abs Immature Granulocytes: 0.06 10*3/uL (ref 0.00–0.07)
Basophils Absolute: 0 10*3/uL (ref 0.0–0.1)
Basophils Relative: 0 %
Eosinophils Absolute: 0 10*3/uL (ref 0.0–0.5)
Eosinophils Relative: 0 %
HCT: 36.4 % (ref 36.0–46.0)
Hemoglobin: 10.7 g/dL — ABNORMAL LOW (ref 12.0–15.0)
IMMATURE GRANULOCYTES: 1 %
Lymphocytes Relative: 8 %
Lymphs Abs: 0.9 10*3/uL (ref 0.7–4.0)
MCH: 22.6 pg — ABNORMAL LOW (ref 26.0–34.0)
MCHC: 29.4 g/dL — ABNORMAL LOW (ref 30.0–36.0)
MCV: 77 fL — ABNORMAL LOW (ref 80.0–100.0)
Monocytes Absolute: 0.8 10*3/uL (ref 0.1–1.0)
Monocytes Relative: 7 %
NRBC: 0 % (ref 0.0–0.2)
Neutro Abs: 9.2 10*3/uL — ABNORMAL HIGH (ref 1.7–7.7)
Neutrophils Relative %: 84 %
Platelet Count: 294 10*3/uL (ref 150–400)
RBC: 4.73 MIL/uL (ref 3.87–5.11)
RDW: 20.8 % — ABNORMAL HIGH (ref 11.5–15.5)
WBC Count: 11 10*3/uL — ABNORMAL HIGH (ref 4.0–10.5)

## 2018-12-04 LAB — RETICULOCYTES
Immature Retic Fract: 15.9 % (ref 2.3–15.9)
RBC.: 4.73 MIL/uL (ref 3.87–5.11)
Retic Count, Absolute: 69.1 10*3/uL (ref 19.0–186.0)
Retic Ct Pct: 1.5 % (ref 0.4–3.1)

## 2018-12-04 LAB — FERRITIN: Ferritin: 17 ng/mL (ref 11–307)

## 2018-12-04 LAB — IRON AND TIBC
Iron: 22 ug/dL — ABNORMAL LOW (ref 41–142)
Saturation Ratios: 6 % — ABNORMAL LOW (ref 21–57)
TIBC: 378 ug/dL (ref 236–444)
UIBC: 355 ug/dL (ref 120–384)

## 2018-12-04 MED ORDER — SODIUM CHLORIDE 0.9 % IV SOLN
Freq: Once | INTRAVENOUS | Status: AC
  Administered 2018-12-04: 15:00:00 via INTRAVENOUS
  Filled 2018-12-04: qty 250

## 2018-12-04 MED ORDER — SODIUM CHLORIDE 0.9 % IV SOLN
510.0000 mg | Freq: Once | INTRAVENOUS | Status: AC
  Administered 2018-12-04: 510 mg via INTRAVENOUS
  Filled 2018-12-04: qty 17

## 2018-12-04 NOTE — Telephone Encounter (Signed)
Scheduled appt per 02/04 los. ° °Printed calendar and avs. °

## 2018-12-04 NOTE — Patient Instructions (Signed)

## 2018-12-04 NOTE — Telephone Encounter (Signed)
Rescheduled infusion appt for today, okay per [facility].

## 2018-12-05 ENCOUNTER — Ambulatory Visit (INDEPENDENT_AMBULATORY_CARE_PROVIDER_SITE_OTHER): Payer: Medicare HMO | Admitting: Orthopaedic Surgery

## 2018-12-05 ENCOUNTER — Inpatient Hospital Stay: Payer: Medicare HMO

## 2018-12-05 ENCOUNTER — Encounter (INDEPENDENT_AMBULATORY_CARE_PROVIDER_SITE_OTHER): Payer: Self-pay | Admitting: Orthopaedic Surgery

## 2018-12-05 ENCOUNTER — Encounter: Payer: Self-pay | Admitting: Hematology

## 2018-12-05 DIAGNOSIS — M25562 Pain in left knee: Secondary | ICD-10-CM | POA: Diagnosis not present

## 2018-12-05 DIAGNOSIS — M25561 Pain in right knee: Secondary | ICD-10-CM

## 2018-12-05 DIAGNOSIS — G8929 Other chronic pain: Secondary | ICD-10-CM

## 2018-12-05 DIAGNOSIS — M170 Bilateral primary osteoarthritis of knee: Secondary | ICD-10-CM

## 2018-12-05 MED ORDER — METHYLPREDNISOLONE ACETATE 40 MG/ML IJ SUSP
40.0000 mg | INTRAMUSCULAR | Status: AC | PRN
  Administered 2018-12-05: 40 mg via INTRA_ARTICULAR

## 2018-12-05 MED ORDER — LIDOCAINE HCL 1 % IJ SOLN
3.0000 mL | INTRAMUSCULAR | Status: AC | PRN
  Administered 2018-12-05: 3 mL

## 2018-12-05 NOTE — Progress Notes (Signed)
Office Visit Note   Patient: [name]           Date of Birth: Jan 11, 1966           MRN: [id number] Visit Date: 12/05/2018              Requested by: [name], NP [place], [place] [address] PCP: [name], NP   Assessment & Plan: Visit Diagnoses:  1. Primary osteoarthritis of both knees   2. Chronic pain of left knee   3. Chronic pain of right knee     Plan: She understands that steroid injections are the only treatment we can provide to decrease her knee pain.  She also understands that she should wait several months between injections.  She tolerated them well today.  She understands the risk and benefits of injections as well.  All question concerns were answered and addressed.  Follow-up will be as needed.  Follow-Up Instructions: Return if symptoms worsen or fail to improve.   Orders:  No orders of the defined types were placed in this encounter.  No orders of the defined types were placed in this encounter.     Procedures: Large Joint Inj: bilateral knee on 12/05/2018 8:39 AM Indications: diagnostic evaluation and pain Details: 22 G 1.5 in needle, superolateral approach  Arthrogram: No  Medications (Right): 3 mL lidocaine 1 %; 40 mg methylPREDNISolone acetate 40 MG/ML Medications (Left): 3 mL lidocaine 1 %; 40 mg methylPREDNISolone acetate 40 MG/ML Outcome: tolerated well, no immediate complications Procedure, treatment alternatives, risks and benefits explained, specific risks discussed. Consent was given by the patient. Immediately prior to procedure a time out was called to verify the correct patient, procedure, equipment, support staff and site/side marked as required. Patient was prepped and draped in the usual sterile fashion.       Clinical Data: No additional findings.   Subjective: Chief Complaint  Patient presents with  . Left Knee - Pain  . Right Knee - Pain  The patient is well-known to me.  She has  severe osteoarthritis and degenerative joint disease in both knees.  She comes in from time to time for steroid injections in her knees.  Her BMI is 80 and her weight is 483.  She has been hospitalized back in December due to congestive heart failure.  She is been having an acute flareup of knee pain.  Last time we injected her knees was in July of last year.  HPI  Review of Systems She does report some exertional shortness of breath but denies any chest pain.  She also denies any fever, chills, nausea, vomiting.  Objective: Vital Signs: There were no vitals taken for this visit.  Physical Exam She is alert and orient x3 and in no acute distress Ortho Exam Examination of both knees show that they are morbidly obese and with varus malalignment.  There is no redness of either knee. Specialty Comments:  No specialty comments available.  Imaging: No results found.   PMFS History: Patient Active Problem List   Diagnosis Date Noted  . Acute CHF (congestive heart failure) ([place]) 10/27/2018  . CHF exacerbation ([place] [facility]) 04/17/2018  . Diastolic CHF ([place]) 77/93/9030  . CHF (congestive heart failure) ([place]) 01/10/2018  . Unilateral primary osteoarthritis, left knee 02/20/2017  . Unilateral primary osteoarthritis, right knee 02/20/2017  . Chronic pain of right knee 02/20/2017  . COPD exacerbation ([place]) 02/12/2017  . GERD (gastroesophageal reflux disease) 02/12/2017  . Abnormal uterine bleeding  08/09/2016  . Diastolic dysfunction with acute on chronic heart failure ([place]) 06/17/2016  . Microcytic anemia 02/27/2016  . Abdominal pain 02/27/2016  . Chronic diastolic CHF (congestive heart failure) ([place]) 02/27/2016  . Hyperlipidemia 02/24/2016  . Chronic respiratory failure ([place]) 09/21/2015  . Absolute anemia   . Anxiety 04/14/2015  . SOB (shortness of breath)   . Acute on chronic diastolic CHF (congestive heart failure) ([facility] [facility]) 01/18/2015  . Hypoxemia   . Congestive heart failure ([place])   .  Symptomatic anemia 01/15/2015  . Hypothyroidism 01/15/2015  . Morbid obesity ([facility]) 01/15/2015  . Fibroids 12/09/2013  . Hyperglycemia 09/16/2013  . Left knee pain 09/09/2012  . Sarcoidosis 09/09/2012  . Iron deficiency anemia due to chronic blood loss 09/09/2012  . Hypertension 02/25/2011  . Obesity hypoventilation syndrome ([place]) 02/14/2011  . OSA (obstructive sleep apnea) 02/10/2011   Past Medical History:  Diagnosis Date  . Anemia   . Arthritis    hands, shoulders, no meds  . CHF (congestive heart failure) ([facility])    EF60-65%  . Chronic hyperventilation syndrome    w/ obesity tx with albuterol inhaler and oxygen 2L  . COPD (chronic obstructive pulmonary disease) ([facility])    uses oxygen 2 L  . Gallstones   . GERD (gastroesophageal reflux disease)    diet controlled - no meds  . H/O hiatal hernia   . Hypertension   . Hypothyroidism   . Kidney stones   . Morbid obesity ([place])   . Pneumonia    hoispitalized in 08/2011  . Sarcoidosis   . Seasonal allergies   . Sleep apnea    uses CPAP machine     Family History  Problem Relation Age of Onset  . Diabetes Father   . Cancer Father 26       colon cancer   . Diabetes Brother   . Deep vein thrombosis Mother   . Aneurysm Sister        d/o brain aneurysm    Past Surgical History:  Procedure Laterality Date  . [place]   x 1  . CHOLECYSTECTOMY  2000  . DILATION AND CURETTAGE OF UTERUS N/A 08/09/2016   Procedure: DILATATION AND CURETTAGE;  Surgeon: Everitt Amber, MD;  Location: [facility];  Service: Gynecology;  Laterality: N/A;  . HYSTEROSCOPY W/D&C  12/27/2011   Procedure: DILATATION AND CURETTAGE /HYSTEROSCOPY;  Surgeon: Maeola Sarah. Landry Mellow, MD;  Location: [facility];  Service: Gynecology;;  . I and D of abcess  05/2011  . INTRAUTERINE DEVICE (IUD) INSERTION N/A 08/09/2016   Procedure: INTRAUTERINE DEVICE (IUD) INSERTION;  Surgeon: Everitt Amber, MD;  Location: [facility];  Service: Gynecology;  Laterality: N/A;  . LUNG BIOPSY    .  uterine abletion     Social History   Occupational History  . Occupation: unemployeed  Tobacco Use  . Smoking status: Never Smoker  . Smokeless tobacco: Never Used  Substance and Sexual Activity  . Alcohol use: Yes    Comment: occasionally  . Drug use: No  . Sexual activity: Never    Birth control/protection: None

## 2018-12-11 ENCOUNTER — Inpatient Hospital Stay: Payer: Medicare HMO

## 2018-12-11 VITALS — BP 159/72 | HR 65 | Temp 98.5°F | Resp 18

## 2018-12-11 DIAGNOSIS — E039 Hypothyroidism, unspecified: Secondary | ICD-10-CM | POA: Diagnosis not present

## 2018-12-11 DIAGNOSIS — Z79899 Other long term (current) drug therapy: Secondary | ICD-10-CM | POA: Diagnosis not present

## 2018-12-11 DIAGNOSIS — I509 Heart failure, unspecified: Secondary | ICD-10-CM | POA: Diagnosis not present

## 2018-12-11 DIAGNOSIS — D500 Iron deficiency anemia secondary to blood loss (chronic): Secondary | ICD-10-CM | POA: Diagnosis not present

## 2018-12-11 DIAGNOSIS — K219 Gastro-esophageal reflux disease without esophagitis: Secondary | ICD-10-CM | POA: Diagnosis not present

## 2018-12-11 DIAGNOSIS — K449 Diaphragmatic hernia without obstruction or gangrene: Secondary | ICD-10-CM | POA: Diagnosis not present

## 2018-12-11 DIAGNOSIS — I110 Hypertensive heart disease with heart failure: Secondary | ICD-10-CM | POA: Diagnosis not present

## 2018-12-11 DIAGNOSIS — N920 Excessive and frequent menstruation with regular cycle: Secondary | ICD-10-CM | POA: Diagnosis not present

## 2018-12-11 DIAGNOSIS — J449 Chronic obstructive pulmonary disease, unspecified: Secondary | ICD-10-CM | POA: Diagnosis not present

## 2018-12-11 MED ORDER — SODIUM CHLORIDE 0.9 % IV SOLN
510.0000 mg | Freq: Once | INTRAVENOUS | Status: AC
  Administered 2018-12-11: 510 mg via INTRAVENOUS
  Filled 2018-12-11: qty 17

## 2018-12-11 NOTE — Progress Notes (Signed)
Pt did not stay for full 30 minute observation period. Pt stated she has tolerated this infusion well in the past. Vitals stable and pt aware to call with any concerns or questions. Pt verbalized understanding and had no further questions.

## 2018-12-11 NOTE — Patient Instructions (Signed)

## 2018-12-17 NOTE — Progress Notes (Deleted)
Cardiology Office Note   Date:  12/17/2018   ID:  [name], DOB [birth year], MRN [id number]  PCP:  [name], NP  Cardiologist:  Dr. [name]    No chief complaint on file.     History of Present Illness: [name] is a 53 y.o. female who presents for ***  diastolic heart failure, morbid obesity, hypothyroidism, hypertension, sarcoidosis with  March 2019 seen in consultation.  She was last seen by me in 2016 and felt dyspneic secondary to morbid obesity.  Normal EF, normal BMP.  Right heart cath was discussed but pulmonary pressures are likely elevated to body habitus.  Felt like she was released way to soon. Diarrhea, Fever.   Echocardiogram 01/11/18 Study Conclusions - Left ventricle: The cavity size was normal. Wall thickness was increased in a pattern of mild LVH. Systolic function was normal. The estimated ejection fraction was in the range of 55% to 60%. Wall motion was normal; there were no regional wall motion abnormalities. - Aortic valve: Trileaflet; mildly calcified leaflets. - Right ventricle: The cavity size was mildly dilated. - Pulmonary arteries: Systolic pressure was mildly increased. PA peak pressure: 35 mm Hg (S). - Pericardium, extracardiac: A trivial pericardial effusion was identified.  BMP at that time once again was low underrepresented because of morbid obesity component troponins were normal. EF was normal. Fluid restriction salt restriction.  Lasix.  [place] Provencal, NP on 11/07/2018.  In their note, seems like she was refusing to take 80 mg of Lasix twice a day and she was continued on 40 mg twice a day. Had been hospitalized 09/2018  In Jan was seen with increase of wt and lasix increased  Up to 120 mg BID. BMP was ordered but not done.  Iron def anemia and IV infusions.   Past Medical History:  Diagnosis Date  . Anemia   . Arthritis    hands, shoulders, no meds  . CHF (congestive heart  failure) (HCC)    EF60-65%  . Chronic hyperventilation syndrome    w/ obesity tx with albuterol inhaler and oxygen 2L  . COPD (chronic obstructive pulmonary disease) ([place])    uses oxygen 2 L  . Gallstones   . GERD (gastroesophageal reflux disease)    diet controlled - no meds  . H/O hiatal hernia   . Hypertension   . Hypothyroidism   . Kidney stones   . Morbid obesity ([place])   . Pneumonia    hoispitalized in 08/2011  . Sarcoidosis   . Seasonal allergies   . Sleep apnea    uses CPAP machine     Past Surgical History:  Procedure Laterality Date  . [place]   x 1  . CHOLECYSTECTOMY  2000  . DILATION AND CURETTAGE OF UTERUS N/A 08/09/2016   Procedure: DILATATION AND CURETTAGE;  Surgeon: Everitt Amber, MD;  [facility];  Service: Gynecology;  Laterality: N/A;  . HYSTEROSCOPY W/D&C  12/27/2011   Procedure: DILATATION AND CURETTAGE /HYSTEROSCOPY;  Surgeon: Maeola Sarah. Landry Mellow, MD;  Location: [facility];  Service: Gynecology;;  . I and D of abcess  05/2011  . INTRAUTERINE DEVICE (IUD) INSERTION N/A 08/09/2016   Procedure: INTRAUTERINE DEVICE (IUD) INSERTION;  Surgeon: Everitt Amber, MD;  [facility];  Service: Gynecology;  Laterality: N/A;  . LUNG BIOPSY    . uterine abletion       Current Outpatient Medications  Medication Sig Dispense Refill  . albuterol (PROAIR HFA) 108 (90 Base)  MCG/ACT inhaler Inhale 1-2 puffs into the lungs every 6 (six) hours as needed for wheezing or shortness of breath.    . cetirizine (ZYRTEC) 10 MG tablet Take 10 mg by mouth at bedtime as needed for allergies.    . cholecalciferol (VITAMIN D) 400 UNITS TABS tablet Take 400 Units by mouth daily.    Marland Kitchen Coral Calcium 1000 (390 CA) MG TABS Take 1,000 mg by mouth daily.     . diphenhydrAMINE (BENADRYL) 25 mg capsule Take 25 mg by mouth every 6 (six) hours as needed (fever).    . DUREZOL 0.05 % EMUL INSTILL 1 DROP INTO LEFT EYE 3 TIMES A DAY AS DIRECTED  1  . famotidine (PEPCID) 40 MG tablet Take 1  tablet (40 mg total) by mouth 2 (two) times daily. 027 tablet 3  . folic acid (FOLVITE) 1 MG tablet Take 1 mg by mouth daily.  3  . furosemide (LASIX) 40 MG tablet Take 3 tablets (120 mg total) by mouth 2 (two) times daily. 180 tablet 3  . HYDROcodone-acetaminophen (NORCO) 10-325 MG tablet Take 1 tablet by mouth every 6 (six) hours as needed (pain). 30 tablet 0  . ketorolac (ACULAR) 0.5 % ophthalmic solution     . losartan (COZAAR) 100 MG tablet TAKE 1 TABLET (100 MG TOTAL) DAILY BY MOUTH. 90 tablet 0  . methotrexate 250 MG/10ML injection once a week. Friday  3  . metoprolol succinate (TOPROL-XL) 25 MG 24 hr tablet Take 1 tablet (25 mg total) by mouth daily. Needs physical for future refills. 90 tablet 0  . NON FORMULARY 2 liter of oxygen    . potassium chloride SA (K-DUR,KLOR-CON) 20 MEQ tablet Take 2 tablets (40 mEq total) by mouth daily. (Patient taking differently: Take 20 mEq by mouth daily. ) 60 tablet 0  . predniSONE (DELTASONE) 10 MG tablet Take 1 tablet (10 mg total) by mouth daily. 10 tablet 0  . triamcinolone acetonide (KENALOG) 40 MG/ML injection Inject 40 mg into the muscle every 3 (three) months. For knee pain    . triamcinolone ointment (KENALOG) 0.1 % Apply 1 application topically See admin instructions. Applies twice a day as needed for break out of Sarcoidosis    . vitamin B-12 1000 MCG tablet Take 1 tablet (1,000 mcg total) by mouth daily. 30 tablet 0   No current facility-administered medications for this visit.     Allergies:   Patient has no known allergies.    Social History:  The patient  reports that she has never smoked. She has never used smokeless tobacco. She reports current alcohol use. She reports that she does not use drugs.   Family History:  The patient's ***family history includes Aneurysm in her sister; Cancer (age of onset: 29) in her father; Deep vein thrombosis in her mother; Diabetes in her brother and father.    ROS:  General:no colds or fevers, no  weight changes Skin:no rashes or ulcers HEENT:no blurred vision, no congestion CV:see HPI PUL:see HPI GI:no diarrhea constipation or melena, no indigestion GU:no hematuria, no dysuria MS:no joint pain, no claudication Neuro:no syncope, no lightheadedness Endo:no diabetes, no thyroid disease Wt Readings from Last 3 Encounters:  12/04/18 (!) 483 lb 11.2 oz (219.4 kg)  11/19/18 (!) 501 lb 6.4 oz (227.4 kg)  11/07/18 (!) 487 lb (220.9 kg)     PHYSICAL EXAM: VS:  There were no vitals taken for this visit. , BMI There is no height or weight on file to calculate BMI. General:Pleasant  affect, NAD Skin:Warm and dry, brisk capillary refill HEENT:normocephalic, sclera clear, mucus membranes moist Neck:supple, no JVD, no bruits  Heart:S1S2 RRR without murmur, gallup, rub or click Lungs:clear without rales, rhonchi, or wheezes NUU:VOZD, non tender, + BS, do not palpate liver spleen or masses Ext:no lower ext edema, 2+ pedal pulses, 2+ radial pulses Neuro:alert and oriented, MAE, follows commands, + facial symmetry    EKG:  EKG is ordered today. The ekg ordered today demonstrates ***   Recent Labs: 10/26/2018: B Natriuretic Peptide 52.8 10/27/2018: ALT 10; TSH 2.967 10/28/2018: Magnesium 1.9 11/07/2018: BUN 18; Creatinine, Ser 0.74; Potassium 5.3; Sodium 135 12/04/2018: Hemoglobin 10.7; Platelet Count 294    Lipid Panel    Component Value Date/Time   CHOL 261 (H) 03/02/2017 0817   TRIG 130.0 03/02/2017 0817   HDL 63.20 03/02/2017 0817   CHOLHDL 4 03/02/2017 0817   VLDL 26.0 03/02/2017 0817   LDLCALC 172 (H) 03/02/2017 0817       Other studies Reviewed: Additional studies/ records that were reviewed today include: ***.   ASSESSMENT AND PLAN:  1.  ***   Current medicines are reviewed with the patient today.  The patient Has no concerns regarding medicines.  The following changes have been made:  See above Labs/ tests ordered today include:see above  Disposition:   FU:   see above  Signed, Cecilie Kicks, NP  12/17/2018 9:11 PM    [facility] [place], [address] [place], [place] Phone: [phone]; Fax: [phone]

## 2018-12-18 ENCOUNTER — Ambulatory Visit: Payer: Medicare HMO | Admitting: Cardiology

## 2018-12-19 ENCOUNTER — Encounter: Payer: Self-pay | Admitting: Cardiology

## 2018-12-20 DIAGNOSIS — G4733 Obstructive sleep apnea (adult) (pediatric): Secondary | ICD-10-CM | POA: Diagnosis not present

## 2018-12-21 ENCOUNTER — Telehealth: Payer: Self-pay

## 2018-12-21 NOTE — Telephone Encounter (Signed)
Form filled out and placed on Cory's desk for signature.

## 2018-12-21 NOTE — Telephone Encounter (Signed)
Ok for perm. placecard

## 2018-12-21 NOTE — Telephone Encounter (Signed)
Copied from [place] [id number]. Topic: General - Other >> Dec 21, 2018 10:32 AM Lennox Solders wrote: Reason for CRM: pt handicap placard/form about to  expired. Pt needs need a new one. Pt unable to walk far due to copd

## 2018-12-26 DIAGNOSIS — R918 Other nonspecific abnormal finding of lung field: Secondary | ICD-10-CM | POA: Diagnosis not present

## 2018-12-26 DIAGNOSIS — G4733 Obstructive sleep apnea (adult) (pediatric): Secondary | ICD-10-CM | POA: Diagnosis not present

## 2018-12-26 DIAGNOSIS — R0602 Shortness of breath: Secondary | ICD-10-CM | POA: Diagnosis not present

## 2018-12-26 DIAGNOSIS — E669 Obesity, unspecified: Secondary | ICD-10-CM | POA: Diagnosis not present

## 2018-12-26 DIAGNOSIS — D869 Sarcoidosis, unspecified: Secondary | ICD-10-CM | POA: Diagnosis not present

## 2018-12-26 DIAGNOSIS — E662 Morbid (severe) obesity with alveolar hypoventilation: Secondary | ICD-10-CM | POA: Diagnosis not present

## 2018-12-26 NOTE — Telephone Encounter (Signed)
Copy placed at the front desk for pick up.  Norlene Duel advising pt to pick up.  Nothing further needed.

## 2019-01-02 DIAGNOSIS — R69 Illness, unspecified: Secondary | ICD-10-CM | POA: Diagnosis not present

## 2019-01-24 DIAGNOSIS — R062 Wheezing: Secondary | ICD-10-CM | POA: Diagnosis not present

## 2019-01-24 DIAGNOSIS — G4733 Obstructive sleep apnea (adult) (pediatric): Secondary | ICD-10-CM | POA: Diagnosis not present

## 2019-01-24 DIAGNOSIS — E661 Drug-induced obesity: Secondary | ICD-10-CM | POA: Diagnosis not present

## 2019-01-24 DIAGNOSIS — R918 Other nonspecific abnormal finding of lung field: Secondary | ICD-10-CM | POA: Diagnosis not present

## 2019-01-25 DIAGNOSIS — R0602 Shortness of breath: Secondary | ICD-10-CM | POA: Diagnosis not present

## 2019-01-25 DIAGNOSIS — M159 Polyosteoarthritis, unspecified: Secondary | ICD-10-CM | POA: Diagnosis not present

## 2019-01-25 DIAGNOSIS — Z79899 Other long term (current) drug therapy: Secondary | ICD-10-CM | POA: Diagnosis not present

## 2019-01-25 DIAGNOSIS — E662 Morbid (severe) obesity with alveolar hypoventilation: Secondary | ICD-10-CM | POA: Diagnosis not present

## 2019-01-25 DIAGNOSIS — M064 Inflammatory polyarthropathy: Secondary | ICD-10-CM | POA: Diagnosis not present

## 2019-01-25 DIAGNOSIS — D860 Sarcoidosis of lung: Secondary | ICD-10-CM | POA: Diagnosis not present

## 2019-02-01 ENCOUNTER — Inpatient Hospital Stay: Payer: Medicare HMO | Attending: Hematology

## 2019-02-09 DIAGNOSIS — E669 Obesity, unspecified: Secondary | ICD-10-CM | POA: Diagnosis not present

## 2019-02-09 DIAGNOSIS — G4733 Obstructive sleep apnea (adult) (pediatric): Secondary | ICD-10-CM | POA: Diagnosis not present

## 2019-02-24 DIAGNOSIS — R918 Other nonspecific abnormal finding of lung field: Secondary | ICD-10-CM | POA: Diagnosis not present

## 2019-02-24 DIAGNOSIS — G4733 Obstructive sleep apnea (adult) (pediatric): Secondary | ICD-10-CM | POA: Diagnosis not present

## 2019-02-24 DIAGNOSIS — E661 Drug-induced obesity: Secondary | ICD-10-CM | POA: Diagnosis not present

## 2019-02-24 DIAGNOSIS — R062 Wheezing: Secondary | ICD-10-CM | POA: Diagnosis not present

## 2019-02-25 ENCOUNTER — Other Ambulatory Visit: Payer: Self-pay | Admitting: Adult Health

## 2019-02-26 ENCOUNTER — Other Ambulatory Visit: Payer: Self-pay | Admitting: Adult Health

## 2019-02-27 NOTE — Telephone Encounter (Signed)
Sent to the pharmacy by e-scribe. 

## 2019-02-27 NOTE — Telephone Encounter (Signed)
Ok for 90 days  

## 2019-02-28 ENCOUNTER — Telehealth: Payer: Self-pay | Admitting: Cardiology

## 2019-02-28 ENCOUNTER — Telehealth: Payer: Self-pay | Admitting: Pulmonary Disease

## 2019-02-28 NOTE — Telephone Encounter (Signed)
Please have patient call (cardiology) Dr. [name] office. He ordered the labs and it sounds like she has not been complaint with his recommendations /dosage for lasix. His last note states that she should have been taking 120 mg twice daily. Please contact DME company to get a new oxygen tank if she is having issues with her current oxygen set up. Thanks.

## 2019-02-28 NOTE — Telephone Encounter (Signed)
PCP advised pt to call cardiologist after pt reports burning sensation in her toes that she thought was due to her thyroid issue. Pt reports being anemic and having to get blood transfusions every few months.   Pt reports extreme fatigue and intermittent SOB at rest. She has gained 8 pounds in the past week. She Is holding fluid in her feet and legs. She reports chest tightness but says she has been feeling that way for years when she takes a deep breath.   Reports being constantly thirsty but has been limiting her fluid intake   Pt reports [facility] perscribing her to take 120mg  of Lasix twice daily and pt has been taking 80mg  twice daily because she felt as if the 120 mg BID was too much.   Will route to [employer], A [facility] for advisement.

## 2019-02-28 NOTE — Telephone Encounter (Signed)
Primary Pulmonologist: RA Last office visit and with whom: 11/07/2018 with [name] What do we see them for (pulmonary problems): sarcoidosis/acute on chronic diastolic CHF Last OV assessment/plan: Instructions  Return in about 4 weeks (around 12/05/2018) for follow up Dr. Elsworth Soho.  Continue current medications May use O2 with exertion Keep upcoming appointment with cardiology Low sodium diet Weigh daily Stay active Follow up with Dr. Elsworth Soho at his first available appointment in around 4 weeks or sooner if needed       Was appointment offered to patient (explain)?  Pt wants to know recommendations to help with symptoms.   Reason for call: called and spoke with pt who stated she has been retaining fluid, has burning sensation in her toes, increased fatigue, constant thirst, and increased SOB. Pt isn't sure if her iron or hemoglobin might be low as she did not go get labs performed at [facility] due to [place]. Pt stated she was supposed to get labs performed 4/3 but did not go due to [place].  Pt has increased her lasix and states she is now taking 80mg  lasix bid but per pt, Dr. [name] recommended pt to take 120mg  lasix which pt stated she felt like that was too much so she is doing 80mg  lasix bid. Pt states her current weight was 507 this morning when she weighed herself.   Pt stated due to the increased SOB, she did use her rescue inhaler once yesterday; states she is still using O2 at 2L daily. Pt stated she has been having problems with O2 concentrator going out and stated she needs to contact DME due to that. Pt stated she is on prednisone daily and is currently taking 10mg  daily. Since pt stated that she has constant thirst, I asked pt if she checks blood sugars and pt stated that she does not but stated if she needed to, she could check it as her son is a diabetic.  Pt denies any complaints of fever, has not travelled anywhere recently, and has not been around anyone that has been sick.  Pt stated that  she did call PCP but they told pt to call our office for recommendations on symptoms. Tonya, please advise on this for pt. Thanks!

## 2019-02-28 NOTE — Telephone Encounter (Signed)
Message received from Triage. I returned call to patient.   At her last visit with Dr. [name], he recommended 120 mg lasix BID. She has only been taking 80 mg BID. I suggested going to 120 mg BID for the next four days to try to get fluid off without coming to the hospital. She states that she will refuse. She is not interested in taking a higher dose of lasix. I suggested 120 zAM and 80 in the evening and she refused this as well. I let her know that we are trying to keep her out of the hospital by increasing lasix at home. She is upset that I am not addressing her anemia. She states she needs an iron infusion. I suggested that she contact her PCP for her anemia and tried to explain that we don't oversee iron infusions.   We discussed when she should report to the ER. I told her I would send a message to Dr. [name] for further advisement.   Tami Lin [facility], PA-C 02/28/2019, 11:17 AM

## 2019-02-28 NOTE — Telephone Encounter (Signed)
Called the patient back, advised of need to contact Cardiology regarding the lasix they prescribe since she felt the 120 was too strong and was taking a lower dose. Before I could finish telling the patient the rest of the information to be relayed to her, she stated "oh, ok. So the buck is being passed, I contacted my pcp who told me to call you all, and now I have to call someone else". I told the patient no, that she would need to call them since they issued the medicine and maybe be told what the right dosage will be that works for her. Patient ended call before anything else could be said.  Nothing further needed at this time.

## 2019-02-28 NOTE — Telephone Encounter (Signed)
New Message   Pt c/o swelling: STAT is pt has developed SOB within 24 hours  1) How much weight have you gained and in what time span? 8lbs in a week  2) If swelling, where is the swelling located? Ankles and feet  3) Are you currently taking a fluid pill? yes  4) Are you currently SOB? Not currently. But sometimes she feels sob.   5) Do you have a log of your daily weights (if so, list)? No patient does not log. She keeps a running mental number   6) Have you gained 3 pounds in a day or 5 pounds in a week? Yes   7) Have you traveled recently? No    Patient states that she has spoke with her PCP and as well her oncologist about her symptoms.  She also has hypothyroidism. She said that she was told to contact her cardiologist. She is experiencing a lot of fatigue , toes burning and some sob. Please call.

## 2019-03-04 DIAGNOSIS — G4733 Obstructive sleep apnea (adult) (pediatric): Secondary | ICD-10-CM | POA: Diagnosis not present

## 2019-03-11 DIAGNOSIS — E669 Obesity, unspecified: Secondary | ICD-10-CM | POA: Diagnosis not present

## 2019-03-11 DIAGNOSIS — G4733 Obstructive sleep apnea (adult) (pediatric): Secondary | ICD-10-CM | POA: Diagnosis not present

## 2019-03-21 ENCOUNTER — Telehealth: Payer: Self-pay | Admitting: Adult Health

## 2019-03-21 ENCOUNTER — Other Ambulatory Visit: Payer: Self-pay | Admitting: Adult Health

## 2019-03-21 DIAGNOSIS — F4321 Adjustment disorder with depressed mood: Secondary | ICD-10-CM

## 2019-03-21 MED ORDER — ALPRAZOLAM 0.5 MG PO TABS: 0.5000 mg | ORAL_TABLET | Freq: Two times a day (BID) | ORAL | 0 refills | Status: AC | PRN

## 2019-03-21 NOTE — Telephone Encounter (Signed)
Pt is requesting a call back once someone knows something about her medication.  She is requesting a Xanax refill.  Her mother died last night and she is going out of town for the arrangements and funeral tomorrow.

## 2019-03-21 NOTE — Telephone Encounter (Signed)
I am sorry to hear that. I have sent in a short course of Xanax for her

## 2019-03-21 NOTE — Telephone Encounter (Signed)
Pt notified that rx was sent to the pharmacy by e-scribe.

## 2019-04-01 ENCOUNTER — Telehealth: Payer: Self-pay | Admitting: *Deleted

## 2019-04-01 NOTE — Telephone Encounter (Signed)
Copied from [place] [id number]. Topic: General - Inquiry >> Apr 01, 2019  8:15 AM Margot Ables wrote: Reason for CRM: Pt has oncology labs this Wednesday 6/3 at [facility]. She knows she hasn't seen Tommi Rumps since January 2020 but wants to have any and all labs at the same time. Oncology only checking hemoglobin and iron. Pt said it's been a while since she had a thyroid check. She doesn't want to make multiple visits due to [place]. Please advise.

## 2019-04-02 ENCOUNTER — Other Ambulatory Visit: Payer: Self-pay | Admitting: Adult Health

## 2019-04-02 ENCOUNTER — Other Ambulatory Visit: Payer: Self-pay

## 2019-04-02 ENCOUNTER — Telehealth: Payer: Self-pay

## 2019-04-02 DIAGNOSIS — D500 Iron deficiency anemia secondary to blood loss (chronic): Secondary | ICD-10-CM

## 2019-04-02 DIAGNOSIS — E038 Other specified hypothyroidism: Secondary | ICD-10-CM

## 2019-04-02 NOTE — Telephone Encounter (Signed)
I have added a TSH, have her make sure that they know to draw when she goes in   The other labs would have to be tied to a complete physical exam for which she will need to come in and have done soon.

## 2019-04-02 NOTE — Telephone Encounter (Signed)
Patient calls stating that because her son cannot accompany her into [facility] for labs tomorrow she would like [facility] to come to her home to draw labs.  I called [facility] and left a message for someone to call me back as I am not aware of this being a service they provide.  I also left a voice message for the patient that we have made arrangements for our NT to take a large wheelchair out to the car in the morning and take her to the lab for her lab draw as well as take her back out of the building for her son to pick her up.  I asked that she call me back if this is not satisfactory.

## 2019-04-02 NOTE — Telephone Encounter (Signed)
Spoke to the pt and informed her that [facility] did add TSH.  She will make sure to mention this to oncology so they will draw it.  All other lab work will need a cpx.  Pt agreed and nothing further needed.  Will close message.

## 2019-04-03 ENCOUNTER — Other Ambulatory Visit: Payer: Self-pay | Admitting: *Deleted

## 2019-04-03 ENCOUNTER — Other Ambulatory Visit: Payer: Self-pay

## 2019-04-03 ENCOUNTER — Inpatient Hospital Stay: Payer: Medicare HMO | Attending: Hematology

## 2019-04-03 ENCOUNTER — Other Ambulatory Visit: Payer: Self-pay | Admitting: Adult Health

## 2019-04-03 DIAGNOSIS — I5033 Acute on chronic diastolic (congestive) heart failure: Secondary | ICD-10-CM

## 2019-04-03 DIAGNOSIS — N920 Excessive and frequent menstruation with regular cycle: Secondary | ICD-10-CM | POA: Insufficient documentation

## 2019-04-03 DIAGNOSIS — Z79899 Other long term (current) drug therapy: Secondary | ICD-10-CM | POA: Insufficient documentation

## 2019-04-03 DIAGNOSIS — D500 Iron deficiency anemia secondary to blood loss (chronic): Secondary | ICD-10-CM | POA: Insufficient documentation

## 2019-04-03 DIAGNOSIS — E038 Other specified hypothyroidism: Secondary | ICD-10-CM

## 2019-04-03 LAB — CBC WITH DIFFERENTIAL (CANCER CENTER ONLY)
Abs Immature Granulocytes: 0.08 10*3/uL — ABNORMAL HIGH (ref 0.00–0.07)
Basophils Absolute: 0 10*3/uL (ref 0.0–0.1)
Basophils Relative: 0 %
Eosinophils Absolute: 0 10*3/uL (ref 0.0–0.5)
Eosinophils Relative: 0 %
HCT: 40.4 % (ref 36.0–46.0)
Hemoglobin: 11.8 g/dL — ABNORMAL LOW (ref 12.0–15.0)
Immature Granulocytes: 1 %
Lymphocytes Relative: 5 %
Lymphs Abs: 0.5 10*3/uL — ABNORMAL LOW (ref 0.7–4.0)
MCH: 24.2 pg — ABNORMAL LOW (ref 26.0–34.0)
MCHC: 29.2 g/dL — ABNORMAL LOW (ref 30.0–36.0)
MCV: 83 fL (ref 80.0–100.0)
Monocytes Absolute: 0.3 10*3/uL (ref 0.1–1.0)
Monocytes Relative: 3 %
Neutro Abs: 9 10*3/uL — ABNORMAL HIGH (ref 1.7–7.7)
Neutrophils Relative %: 91 %
Platelet Count: 225 10*3/uL (ref 150–400)
RBC: 4.87 MIL/uL (ref 3.87–5.11)
RDW: 19.3 % — ABNORMAL HIGH (ref 11.5–15.5)
WBC Count: 10 10*3/uL (ref 4.0–10.5)
nRBC: 0 % (ref 0.0–0.2)

## 2019-04-03 LAB — IRON AND TIBC
Iron: 24 ug/dL — ABNORMAL LOW (ref 41–142)
Saturation Ratios: 6 % — ABNORMAL LOW (ref 21–57)
TIBC: 381 ug/dL (ref 236–444)
UIBC: 358 ug/dL (ref 120–384)

## 2019-04-03 LAB — RETICULOCYTES
Immature Retic Fract: 14.2 % (ref 2.3–15.9)
RBC.: 4.88 MIL/uL (ref 3.87–5.11)
Retic Count, Absolute: 67.8 10*3/uL (ref 19.0–186.0)
Retic Ct Pct: 1.4 % (ref 0.4–3.1)

## 2019-04-03 LAB — FERRITIN: Ferritin: 13 ng/mL (ref 11–307)

## 2019-04-04 ENCOUNTER — Telehealth: Payer: Self-pay | Admitting: Hematology

## 2019-04-04 NOTE — Telephone Encounter (Signed)
Scheduled appts per sch msg. Called and left msg for patient ° °

## 2019-04-05 ENCOUNTER — Telehealth: Payer: Self-pay | Admitting: Hematology

## 2019-04-05 ENCOUNTER — Other Ambulatory Visit: Payer: Self-pay

## 2019-04-05 DIAGNOSIS — I5033 Acute on chronic diastolic (congestive) heart failure: Secondary | ICD-10-CM

## 2019-04-05 DIAGNOSIS — D500 Iron deficiency anemia secondary to blood loss (chronic): Secondary | ICD-10-CM

## 2019-04-05 NOTE — Telephone Encounter (Signed)
Added lab to 6/15 appt. Called and left msg for patient.

## 2019-04-08 LAB — BASIC METABOLIC PANEL
BUN/Creatinine Ratio: 23 (ref 9–23)
BUN: 17 mg/dL (ref 6–24)
CO2: 25 mmol/L (ref 20–29)
Calcium: 10.2 mg/dL (ref 8.7–10.2)
Chloride: 98 mmol/L (ref 96–106)
Creatinine, Ser: 0.73 mg/dL (ref 0.57–1.00)
GFR calc Af Amer: 109 mL/min/{1.73_m2} (ref >59)
GFR calc non Af Amer: 94 mL/min/{1.73_m2} (ref >59)
Glucose: 163 mg/dL — ABNORMAL HIGH (ref 65–99)
Potassium: 5.4 mmol/L — ABNORMAL HIGH (ref 3.5–5.2)
Sodium: 142 mmol/L (ref 134–144)

## 2019-04-08 LAB — TSH: TSH: 2.34 u[IU]/mL (ref 0.450–4.500)

## 2019-04-10 ENCOUNTER — Other Ambulatory Visit: Payer: Self-pay | Admitting: Adult Health

## 2019-04-10 DIAGNOSIS — R7309 Other abnormal glucose: Secondary | ICD-10-CM

## 2019-04-11 DIAGNOSIS — G4733 Obstructive sleep apnea (adult) (pediatric): Secondary | ICD-10-CM | POA: Diagnosis not present

## 2019-04-11 DIAGNOSIS — E669 Obesity, unspecified: Secondary | ICD-10-CM | POA: Diagnosis not present

## 2019-04-15 ENCOUNTER — Ambulatory Visit: Payer: Medicare HMO

## 2019-04-15 ENCOUNTER — Inpatient Hospital Stay: Payer: Medicare HMO

## 2019-04-15 ENCOUNTER — Other Ambulatory Visit: Payer: Self-pay

## 2019-04-15 VITALS — BP 158/74 | HR 74 | Temp 98.3°F | Resp 20

## 2019-04-15 DIAGNOSIS — N920 Excessive and frequent menstruation with regular cycle: Secondary | ICD-10-CM | POA: Diagnosis not present

## 2019-04-15 DIAGNOSIS — D500 Iron deficiency anemia secondary to blood loss (chronic): Secondary | ICD-10-CM

## 2019-04-15 DIAGNOSIS — Z79899 Other long term (current) drug therapy: Secondary | ICD-10-CM | POA: Diagnosis not present

## 2019-04-15 DIAGNOSIS — I5033 Acute on chronic diastolic (congestive) heart failure: Secondary | ICD-10-CM

## 2019-04-15 LAB — CBC WITH DIFFERENTIAL (CANCER CENTER ONLY)
Abs Immature Granulocytes: 0.02 10*3/uL (ref 0.00–0.07)
Basophils Absolute: 0 10*3/uL (ref 0.0–0.1)
Basophils Relative: 0 %
Eosinophils Absolute: 0 10*3/uL (ref 0.0–0.5)
Eosinophils Relative: 0 %
HCT: 40.4 % (ref 36.0–46.0)
Hemoglobin: 11.8 g/dL — ABNORMAL LOW (ref 12.0–15.0)
Immature Granulocytes: 0 %
Lymphocytes Relative: 6 %
Lymphs Abs: 0.4 10*3/uL — ABNORMAL LOW (ref 0.7–4.0)
MCH: 24.1 pg — ABNORMAL LOW (ref 26.0–34.0)
MCHC: 29.2 g/dL — ABNORMAL LOW (ref 30.0–36.0)
MCV: 82.4 fL (ref 80.0–100.0)
Monocytes Absolute: 0.1 10*3/uL (ref 0.1–1.0)
Monocytes Relative: 2 %
Neutro Abs: 6 10*3/uL (ref 1.7–7.7)
Neutrophils Relative %: 92 %
Platelet Count: 230 10*3/uL (ref 150–400)
RBC: 4.9 MIL/uL (ref 3.87–5.11)
RDW: 19.6 % — ABNORMAL HIGH (ref 11.5–15.5)
WBC Count: 6.6 10*3/uL (ref 4.0–10.5)
nRBC: 0 % (ref 0.0–0.2)

## 2019-04-15 LAB — CMP (CANCER CENTER ONLY)
ALT: 21 U/L (ref 0–44)
AST: 17 U/L (ref 15–41)
Albumin: 3.9 g/dL (ref 3.5–5.0)
Alkaline Phosphatase: 44 U/L (ref 38–126)
Anion gap: 13 (ref 5–15)
BUN: 15 mg/dL (ref 6–20)
CO2: 26 mmol/L (ref 22–32)
Calcium: 10 mg/dL (ref 8.9–10.3)
Chloride: 101 mmol/L (ref 98–111)
Creatinine: 0.83 mg/dL (ref 0.44–1.00)
GFR, Est AFR Am: >60 mL/min (ref >60)
GFR, Estimated: >60 mL/min (ref >60)
Glucose, Bld: 172 mg/dL — ABNORMAL HIGH (ref 70–99)
Potassium: 4.8 mmol/L (ref 3.5–5.1)
Sodium: 140 mmol/L (ref 135–145)
Total Bilirubin: 0.5 mg/dL (ref 0.3–1.2)
Total Protein: 7.7 g/dL (ref 6.5–8.1)

## 2019-04-15 LAB — IRON AND TIBC
Iron: 39 ug/dL — ABNORMAL LOW (ref 41–142)
Saturation Ratios: 10 % — ABNORMAL LOW (ref 21–57)
TIBC: 374 ug/dL (ref 236–444)
UIBC: 335 ug/dL (ref 120–384)

## 2019-04-15 LAB — RETICULOCYTES
Immature Retic Fract: 13.6 % (ref 2.3–15.9)
RBC.: 4.87 MIL/uL (ref 3.87–5.11)
Retic Count, Absolute: 63.8 10*3/uL (ref 19.0–186.0)
Retic Ct Pct: 1.3 % (ref 0.4–3.1)

## 2019-04-15 LAB — FERRITIN: Ferritin: 23 ng/mL (ref 11–307)

## 2019-04-15 MED ORDER — SODIUM CHLORIDE 0.9 % IV SOLN
750.0000 mL | Freq: Once | INTRAVENOUS | Status: AC
  Administered 2019-04-15: 250 mL via INTRAVENOUS
  Filled 2019-04-15: qty 750

## 2019-04-15 MED ORDER — SODIUM CHLORIDE 0.9 % IV SOLN
510.0000 mg | Freq: Once | INTRAVENOUS | Status: AC
  Administered 2019-04-15: 10:00:00 510 mg via INTRAVENOUS
  Filled 2019-04-15: qty 17

## 2019-04-15 NOTE — Patient Instructions (Signed)

## 2019-04-15 NOTE — Progress Notes (Signed)
Pt. Willing to wait 15 minutes post iron infusion. States she feels fine. VS collected.

## 2019-04-22 ENCOUNTER — Inpatient Hospital Stay: Payer: Medicare HMO

## 2019-04-22 ENCOUNTER — Other Ambulatory Visit: Payer: Self-pay

## 2019-04-22 VITALS — BP 147/67 | HR 74 | Temp 98.5°F | Resp 18

## 2019-04-22 DIAGNOSIS — Z79899 Other long term (current) drug therapy: Secondary | ICD-10-CM | POA: Diagnosis not present

## 2019-04-22 DIAGNOSIS — D500 Iron deficiency anemia secondary to blood loss (chronic): Secondary | ICD-10-CM | POA: Diagnosis not present

## 2019-04-22 DIAGNOSIS — N920 Excessive and frequent menstruation with regular cycle: Secondary | ICD-10-CM | POA: Diagnosis not present

## 2019-04-22 MED ORDER — SODIUM CHLORIDE 0.9 % IV SOLN
Freq: Once | INTRAVENOUS | Status: AC
  Administered 2019-04-22: 09:00:00 via INTRAVENOUS
  Filled 2019-04-22: qty 250

## 2019-04-22 MED ORDER — SODIUM CHLORIDE 0.9 % IV SOLN
510.0000 mg | Freq: Once | INTRAVENOUS | Status: AC
  Administered 2019-04-22: 510 mg via INTRAVENOUS
  Filled 2019-04-22: qty 510

## 2019-04-22 NOTE — Progress Notes (Signed)
Pt only stays for 15 minutes post-feraheme observation. VSS as charted in flowsheets.

## 2019-04-22 NOTE — Patient Instructions (Signed)

## 2019-04-23 DIAGNOSIS — R69 Illness, unspecified: Secondary | ICD-10-CM | POA: Diagnosis not present

## 2019-04-29 ENCOUNTER — Telehealth: Payer: Self-pay | Admitting: Cardiology

## 2019-04-29 NOTE — Telephone Encounter (Signed)
Left message to c/b to discuss her concerns.

## 2019-04-29 NOTE — Telephone Encounter (Signed)
Thank you for update.  Challenging recurring situation. Agree with plan  Candee Furbish, MD

## 2019-04-29 NOTE — Telephone Encounter (Signed)
Spoke with patient regarding weight gain.  She reports her wt is up to 514 lbs.  She is SOB taking 5 steps and has to sit down.  She has swelling in her feet/ankles/knees and hands.  She increased her Furosemide to 120 mg BID back on 4/30 when she called in d/t edema.  She reports this did help some with an increase in urine output but on some days she has no increase in the output.  Her wt 02/28/2019 was 498 lbs.  She states this happens to her every few months and she will have to go to the hospital to get the fluid removed but she has been trying to avoid that d/t Covid-19.  Advised patient, at this time it would be best for her to report to the closest ER for evaluation and treatment.  Pt states understanding but also reports she will not be able to go until this afternoon when her son gets home.  She is aware I will forward this to Dr [name] for his knowledge.

## 2019-04-29 NOTE — Telephone Encounter (Signed)
New Message   Pt c/o swelling: STAT is pt has developed SOB within 24 hours  1) How much weight have you gained and in what time span? 8lbs in one week  If swelling, where is the swelling located?  Feet, ankles and hands  2) Are you currently taking a fluid pill? Yes   3) Are you currently SOB? No on the phone, but she says she does experience SOB when she is moving around.   4) Do you have a log of your daily weights (if so, list)? No, patient weighs herself every few days   5) Have you gained 3 pounds in a day or 5 pounds in a week? yes  6) Have you traveled recently? Traveled out of town about 3 hours away due to the passing of her mother.

## 2019-05-08 ENCOUNTER — Encounter: Payer: Self-pay | Admitting: Adult Health

## 2019-05-08 ENCOUNTER — Encounter (HOSPITAL_COMMUNITY): Payer: Self-pay | Admitting: *Deleted

## 2019-05-08 ENCOUNTER — Other Ambulatory Visit: Payer: Self-pay

## 2019-05-08 ENCOUNTER — Inpatient Hospital Stay (HOSPITAL_COMMUNITY)
Admission: EM | Admit: 2019-05-08 | Discharge: 2019-05-12 | DRG: 291 | Disposition: A | Discharge status: Home / Self Care | Payer: Medicare HMO | Attending: Family Medicine | Admitting: Family Medicine

## 2019-05-08 ENCOUNTER — Ambulatory Visit (INDEPENDENT_AMBULATORY_CARE_PROVIDER_SITE_OTHER): Payer: Medicare HMO | Admitting: Adult Health

## 2019-05-08 ENCOUNTER — Ambulatory Visit (INDEPENDENT_AMBULATORY_CARE_PROVIDER_SITE_OTHER): Payer: Medicare HMO

## 2019-05-08 VITALS — HR 94 | Temp 98.4°F | Wt >= 6400 oz

## 2019-05-08 DIAGNOSIS — R0600 Dyspnea, unspecified: Secondary | ICD-10-CM

## 2019-05-08 DIAGNOSIS — J9621 Acute and chronic respiratory failure with hypoxia: Secondary | ICD-10-CM | POA: Diagnosis present

## 2019-05-08 DIAGNOSIS — Z20828 Contact with and (suspected) exposure to other viral communicable diseases: Secondary | ICD-10-CM | POA: Diagnosis present

## 2019-05-08 DIAGNOSIS — I509 Heart failure, unspecified: Secondary | ICD-10-CM

## 2019-05-08 DIAGNOSIS — I110 Hypertensive heart disease with heart failure: Principal | ICD-10-CM | POA: Diagnosis present

## 2019-05-08 DIAGNOSIS — Z0000 Encounter for general adult medical examination without abnormal findings: Secondary | ICD-10-CM

## 2019-05-08 DIAGNOSIS — J449 Chronic obstructive pulmonary disease, unspecified: Secondary | ICD-10-CM | POA: Diagnosis present

## 2019-05-08 DIAGNOSIS — E559 Vitamin D deficiency, unspecified: Secondary | ICD-10-CM | POA: Diagnosis present

## 2019-05-08 DIAGNOSIS — Z87442 Personal history of urinary calculi: Secondary | ICD-10-CM

## 2019-05-08 DIAGNOSIS — Z79891 Long term (current) use of opiate analgesic: Secondary | ICD-10-CM | POA: Diagnosis not present

## 2019-05-08 DIAGNOSIS — R0602 Shortness of breath: Secondary | ICD-10-CM | POA: Diagnosis not present

## 2019-05-08 DIAGNOSIS — E785 Hyperlipidemia, unspecified: Secondary | ICD-10-CM

## 2019-05-08 DIAGNOSIS — I5033 Acute on chronic diastolic (congestive) heart failure: Secondary | ICD-10-CM

## 2019-05-08 DIAGNOSIS — Z79899 Other long term (current) drug therapy: Secondary | ICD-10-CM | POA: Diagnosis not present

## 2019-05-08 DIAGNOSIS — G8929 Other chronic pain: Secondary | ICD-10-CM | POA: Diagnosis present

## 2019-05-08 DIAGNOSIS — I358 Other nonrheumatic aortic valve disorders: Secondary | ICD-10-CM | POA: Diagnosis present

## 2019-05-08 DIAGNOSIS — E039 Hypothyroidism, unspecified: Secondary | ICD-10-CM | POA: Diagnosis present

## 2019-05-08 DIAGNOSIS — M1712 Unilateral primary osteoarthritis, left knee: Secondary | ICD-10-CM | POA: Diagnosis present

## 2019-05-08 DIAGNOSIS — R739 Hyperglycemia, unspecified: Secondary | ICD-10-CM

## 2019-05-08 DIAGNOSIS — Z833 Family history of diabetes mellitus: Secondary | ICD-10-CM

## 2019-05-08 DIAGNOSIS — G4733 Obstructive sleep apnea (adult) (pediatric): Secondary | ICD-10-CM | POA: Diagnosis not present

## 2019-05-08 DIAGNOSIS — I10 Essential (primary) hypertension: Secondary | ICD-10-CM | POA: Diagnosis not present

## 2019-05-08 DIAGNOSIS — R609 Edema, unspecified: Secondary | ICD-10-CM | POA: Diagnosis not present

## 2019-05-08 DIAGNOSIS — E669 Obesity, unspecified: Secondary | ICD-10-CM | POA: Diagnosis not present

## 2019-05-08 DIAGNOSIS — R0609 Other forms of dyspnea: Secondary | ICD-10-CM

## 2019-05-08 DIAGNOSIS — J9811 Atelectasis: Secondary | ICD-10-CM | POA: Diagnosis present

## 2019-05-08 DIAGNOSIS — R0902 Hypoxemia: Secondary | ICD-10-CM

## 2019-05-08 DIAGNOSIS — D869 Sarcoidosis, unspecified: Secondary | ICD-10-CM | POA: Diagnosis not present

## 2019-05-08 DIAGNOSIS — Z7952 Long term (current) use of systemic steroids: Secondary | ICD-10-CM | POA: Diagnosis not present

## 2019-05-08 DIAGNOSIS — E038 Other specified hypothyroidism: Secondary | ICD-10-CM | POA: Diagnosis not present

## 2019-05-08 DIAGNOSIS — Z713 Dietary counseling and surveillance: Secondary | ICD-10-CM | POA: Diagnosis not present

## 2019-05-08 DIAGNOSIS — E538 Deficiency of other specified B group vitamins: Secondary | ICD-10-CM

## 2019-05-08 DIAGNOSIS — Z9049 Acquired absence of other specified parts of digestive tract: Secondary | ICD-10-CM | POA: Diagnosis not present

## 2019-05-08 DIAGNOSIS — R7303 Prediabetes: Secondary | ICD-10-CM | POA: Diagnosis not present

## 2019-05-08 DIAGNOSIS — K219 Gastro-esophageal reflux disease without esophagitis: Secondary | ICD-10-CM | POA: Diagnosis present

## 2019-05-08 DIAGNOSIS — E662 Morbid (severe) obesity with alveolar hypoventilation: Secondary | ICD-10-CM | POA: Diagnosis present

## 2019-05-08 DIAGNOSIS — M1711 Unilateral primary osteoarthritis, right knee: Secondary | ICD-10-CM | POA: Diagnosis present

## 2019-05-08 DIAGNOSIS — Z6841 Body Mass Index (BMI) 40.0 and over, adult: Secondary | ICD-10-CM

## 2019-05-08 DIAGNOSIS — N179 Acute kidney failure, unspecified: Secondary | ICD-10-CM | POA: Diagnosis present

## 2019-05-08 DIAGNOSIS — Z800 Family history of malignant neoplasm of digestive organs: Secondary | ICD-10-CM | POA: Diagnosis not present

## 2019-05-08 DIAGNOSIS — Z9981 Dependence on supplemental oxygen: Secondary | ICD-10-CM

## 2019-05-08 DIAGNOSIS — I371 Nonrheumatic pulmonary valve insufficiency: Secondary | ICD-10-CM | POA: Diagnosis not present

## 2019-05-08 DIAGNOSIS — J9611 Chronic respiratory failure with hypoxia: Secondary | ICD-10-CM | POA: Diagnosis not present

## 2019-05-08 LAB — COMPREHENSIVE METABOLIC PANEL
ALT: 20 U/L (ref 0–35)
ALT: 22 U/L (ref 0–44)
AST: 13 U/L (ref 0–37)
AST: 17 U/L (ref 15–41)
Albumin: 4.3 g/dL (ref 3.5–5.2)
Albumin: 4 g/dL (ref 3.5–5.0)
Alkaline Phosphatase: 44 U/L (ref 39–117)
Alkaline Phosphatase: 39 U/L (ref 38–126)
Anion gap: 10 (ref 5–15)
BUN: 19 mg/dL (ref 6–23)
BUN: 19 mg/dL (ref 6–20)
CO2: 28 mEq/L (ref 19–32)
CO2: 26 mmol/L (ref 22–32)
Calcium: 9.1 mg/dL (ref 8.4–10.5)
Calcium: 9.1 mg/dL (ref 8.9–10.3)
Chloride: 100 mEq/L (ref 96–112)
Chloride: 103 mmol/L (ref 98–111)
Creatinine, Ser: 0.97 mg/dL (ref 0.40–1.20)
Creatinine, Ser: 0.86 mg/dL (ref 0.44–1.00)
GFR calc Af Amer: >60 mL/min (ref >60)
GFR calc non Af Amer: >60 mL/min (ref >60)
GFR: 72.52 mL/min (ref >60.00)
Glucose, Bld: 179 mg/dL — ABNORMAL HIGH (ref 70–99)
Glucose, Bld: 170 mg/dL — ABNORMAL HIGH (ref 70–99)
Potassium: 4.8 mEq/L (ref 3.5–5.1)
Potassium: 4.8 mmol/L (ref 3.5–5.1)
Sodium: 141 mEq/L (ref 135–145)
Sodium: 139 mmol/L (ref 135–145)
Total Bilirubin: 0.5 mg/dL (ref 0.2–1.2)
Total Bilirubin: 0.5 mg/dL (ref 0.3–1.2)
Total Protein: 7.3 g/dL (ref 6.0–8.3)
Total Protein: 7.9 g/dL (ref 6.5–8.1)

## 2019-05-08 LAB — CBC WITH DIFFERENTIAL/PLATELET
Abs Immature Granulocytes: 0.07 10*3/uL (ref 0.00–0.07)
Basophils Absolute: 0 10*3/uL (ref 0.0–0.1)
Basophils Absolute: 0 10*3/uL (ref 0.0–0.1)
Basophils Relative: 0.2 % (ref 0.0–3.0)
Basophils Relative: 0 %
Eosinophils Absolute: 0 10*3/uL (ref 0.0–0.7)
Eosinophils Absolute: 0 10*3/uL (ref 0.0–0.5)
Eosinophils Relative: 0.1 % (ref 0.0–5.0)
Eosinophils Relative: 0 %
HCT: 41.4 % (ref 36.0–46.0)
HCT: 42.3 % (ref 36.0–46.0)
Hemoglobin: 12.8 g/dL (ref 12.0–15.0)
Hemoglobin: 12.7 g/dL (ref 12.0–15.0)
Immature Granulocytes: 1 %
Lymphocytes Relative: 6.8 % — ABNORMAL LOW (ref 12.0–46.0)
Lymphocytes Relative: 4 %
Lymphs Abs: 0.6 10*3/uL — ABNORMAL LOW (ref 0.7–4.0)
Lymphs Abs: 0.4 10*3/uL — ABNORMAL LOW (ref 0.7–4.0)
MCH: 26.6 pg (ref 26.0–34.0)
MCHC: 30.9 g/dL (ref 30.0–36.0)
MCHC: 30 g/dL (ref 30.0–36.0)
MCV: 85 fl (ref 78.0–100.0)
MCV: 88.5 fL (ref 80.0–100.0)
Monocytes Absolute: 0.3 10*3/uL (ref 0.1–1.0)
Monocytes Absolute: 0.2 10*3/uL (ref 0.1–1.0)
Monocytes Relative: 3 % (ref 3.0–12.0)
Monocytes Relative: 3 %
Neutro Abs: 7.7 10*3/uL (ref 1.4–7.7)
Neutro Abs: 7.5 10*3/uL (ref 1.7–7.7)
Neutrophils Relative %: 89.9 % — ABNORMAL HIGH (ref 43.0–77.0)
Neutrophils Relative %: 92 %
Platelets: 251 10*3/uL (ref 150.0–400.0)
Platelets: 227 10*3/uL (ref 150–400)
RBC: 4.87 Mil/uL (ref 3.87–5.11)
RBC: 4.78 MIL/uL (ref 3.87–5.11)
RDW: 26.5 % — ABNORMAL HIGH (ref 11.5–15.5)
RDW: 24 % — ABNORMAL HIGH (ref 11.5–15.5)
WBC: 8.5 10*3/uL (ref 4.0–10.5)
WBC: 8.2 10*3/uL (ref 4.0–10.5)
nRBC: 0 % (ref 0.0–0.2)

## 2019-05-08 LAB — LIPID PANEL
Cholesterol: 241 mg/dL — ABNORMAL HIGH (ref 0–200)
HDL: 58.8 mg/dL (ref >39.00)
LDL Cholesterol: 157 mg/dL — ABNORMAL HIGH (ref 0–99)
NonHDL: 182.09
Total CHOL/HDL Ratio: 4
Triglycerides: 125 mg/dL (ref 0.0–149.0)
VLDL: 25 mg/dL (ref 0.0–40.0)

## 2019-05-08 LAB — SARS CORONAVIRUS 2 BY RT PCR (HOSPITAL ORDER, PERFORMED IN ~~LOC~~ HOSPITAL LAB): SARS Coronavirus 2: NEGATIVE

## 2019-05-08 LAB — VITAMIN B12: Vitamin B-12: 543 pg/mL (ref 211–911)

## 2019-05-08 LAB — BRAIN NATRIURETIC PEPTIDE: B Natriuretic Peptide: 54.2 pg/mL (ref 0.0–100.0)

## 2019-05-08 LAB — TROPONIN I (HIGH SENSITIVITY): Troponin I (High Sensitivity): 6 ng/L (ref <18)

## 2019-05-08 LAB — HEMOGLOBIN A1C: Hgb A1c MFr Bld: 5.7 % (ref 4.6–6.5)

## 2019-05-08 LAB — VITAMIN D 25 HYDROXY (VIT D DEFICIENCY, FRACTURES): VITD: 16.66 ng/mL — ABNORMAL LOW (ref 30.00–100.00)

## 2019-05-08 LAB — HCG, QUANTITATIVE, PREGNANCY: hCG, Beta Chain, Quant, S: <1 m[IU]/mL (ref <5)

## 2019-05-08 MED ORDER — ATORVASTATIN CALCIUM 40 MG PO TABS
40.0000 mg | ORAL_TABLET | Freq: Every day | ORAL | Status: DC
  Administered 2019-05-09 – 2019-05-11 (×3): 40 mg via ORAL
  Filled 2019-05-08 (×3): qty 1

## 2019-05-08 MED ORDER — DIFLUPREDNATE 0.05 % OP EMUL: 1.0000 [drp] | Freq: Three times a day (TID) | OPHTHALMIC | Status: DC

## 2019-05-08 MED ORDER — FUROSEMIDE 10 MG/ML IJ SOLN
80.0000 mg | Freq: Once | INTRAMUSCULAR | Status: DC
  Filled 2019-05-08: qty 8

## 2019-05-08 MED ORDER — HYDROCODONE-ACETAMINOPHEN 10-325 MG PO TABS: 1.0000 | ORAL_TABLET | Freq: Four times a day (QID) | ORAL | Status: DC | PRN

## 2019-05-08 MED ORDER — ACETAMINOPHEN 325 MG PO TABS: 650.0000 mg | ORAL_TABLET | Freq: Four times a day (QID) | ORAL | Status: DC | PRN

## 2019-05-08 MED ORDER — HYDRALAZINE HCL 20 MG/ML IJ SOLN: 10.0000 mg | Freq: Three times a day (TID) | INTRAMUSCULAR | Status: DC | PRN

## 2019-05-08 MED ORDER — CALCIUM CITRATE-VITAMIN D 315-200 MG-UNIT PO TABS: 1.0000 | ORAL_TABLET | Freq: Every day | ORAL | Status: DC

## 2019-05-08 MED ORDER — METOPROLOL SUCCINATE ER 25 MG PO TB24: 25.0000 mg | ORAL_TABLET | Freq: Every day | ORAL | Status: DC

## 2019-05-08 MED ORDER — SENNOSIDES-DOCUSATE SODIUM 8.6-50 MG PO TABS: 1.0000 | ORAL_TABLET | Freq: Every evening | ORAL | Status: DC | PRN

## 2019-05-08 MED ORDER — ONDANSETRON HCL 4 MG/2ML IJ SOLN: 4.0000 mg | Freq: Four times a day (QID) | INTRAMUSCULAR | Status: DC | PRN

## 2019-05-08 MED ORDER — ALBUTEROL SULFATE (2.5 MG/3ML) 0.083% IN NEBU
2.5000 mg | INHALATION_SOLUTION | Freq: Four times a day (QID) | RESPIRATORY_TRACT | Status: DC
  Administered 2019-05-08: 2.5 mg via RESPIRATORY_TRACT
  Filled 2019-05-08: qty 3

## 2019-05-08 MED ORDER — ONDANSETRON HCL 4 MG PO TABS: 4.0000 mg | ORAL_TABLET | Freq: Four times a day (QID) | ORAL | Status: DC | PRN

## 2019-05-08 MED ORDER — ALBUTEROL SULFATE (2.5 MG/3ML) 0.083% IN NEBU: 2.5000 mg | INHALATION_SOLUTION | RESPIRATORY_TRACT | Status: DC | PRN

## 2019-05-08 MED ORDER — ENOXAPARIN SODIUM 40 MG/0.4ML ~~LOC~~ SOLN
40.0000 mg | SUBCUTANEOUS | Status: DC
  Administered 2019-05-08: 40 mg via SUBCUTANEOUS
  Filled 2019-05-08: qty 0.4

## 2019-05-08 MED ORDER — VITAMIN B-12 1000 MCG PO TABS
1000.0000 ug | ORAL_TABLET | Freq: Every day | ORAL | Status: DC
  Administered 2019-05-09 – 2019-05-12 (×4): 1000 ug via ORAL
  Filled 2019-05-08 (×4): qty 1

## 2019-05-08 MED ORDER — ALBUTEROL SULFATE (2.5 MG/3ML) 0.083% IN NEBU: 2.5000 mg | INHALATION_SOLUTION | Freq: Four times a day (QID) | RESPIRATORY_TRACT | Status: DC | PRN

## 2019-05-08 MED ORDER — FAMOTIDINE 20 MG PO TABS
40.0000 mg | ORAL_TABLET | Freq: Two times a day (BID) | ORAL | Status: DC
  Administered 2019-05-08 – 2019-05-12 (×8): 40 mg via ORAL
  Filled 2019-05-08 (×9): qty 2

## 2019-05-08 MED ORDER — ALBUTEROL SULFATE (2.5 MG/3ML) 0.083% IN NEBU
2.5000 mg | INHALATION_SOLUTION | Freq: Three times a day (TID) | RESPIRATORY_TRACT | Status: DC
  Administered 2019-05-09: 2.5 mg via RESPIRATORY_TRACT
  Filled 2019-05-08: qty 3

## 2019-05-08 MED ORDER — CALCIUM CARBONATE-VITAMIN D 500-200 MG-UNIT PO TABS
1.0000 | ORAL_TABLET | Freq: Every day | ORAL | Status: DC
  Administered 2019-05-09 – 2019-05-12 (×4): 1 via ORAL
  Filled 2019-05-08 (×4): qty 1

## 2019-05-08 MED ORDER — FUROSEMIDE 10 MG/ML IJ SOLN
80.0000 mg | Freq: Two times a day (BID) | INTRAMUSCULAR | Status: DC
  Administered 2019-05-08 – 2019-05-09 (×3): 80 mg via INTRAVENOUS
  Filled 2019-05-08 (×3): qty 8

## 2019-05-08 MED ORDER — ACETAMINOPHEN 650 MG RE SUPP: 650.0000 mg | Freq: Four times a day (QID) | RECTAL | Status: DC | PRN

## 2019-05-08 MED ORDER — KETOROLAC TROMETHAMINE 0.5 % OP SOLN
1.0000 [drp] | Freq: Four times a day (QID) | OPHTHALMIC | Status: DC
  Filled 2019-05-08: qty 5

## 2019-05-08 MED ORDER — LORATADINE 10 MG PO TABS
10.0000 mg | ORAL_TABLET | Freq: Every day | ORAL | Status: DC
  Administered 2019-05-09 – 2019-05-12 (×4): 10 mg via ORAL
  Filled 2019-05-08 (×4): qty 1

## 2019-05-08 MED ORDER — FOLIC ACID 1 MG PO TABS
1.0000 mg | ORAL_TABLET | Freq: Every day | ORAL | Status: DC
  Administered 2019-05-09 – 2019-05-12 (×4): 1 mg via ORAL
  Filled 2019-05-08 (×4): qty 1

## 2019-05-08 MED ORDER — PREDNISONE 20 MG PO TABS
10.0000 mg | ORAL_TABLET | Freq: Every day | ORAL | Status: DC
  Administered 2019-05-09 – 2019-05-12 (×4): 10 mg via ORAL
  Filled 2019-05-08 (×4): qty 1

## 2019-05-08 MED ORDER — LOSARTAN POTASSIUM 50 MG PO TABS
100.0000 mg | ORAL_TABLET | Freq: Every day | ORAL | Status: DC
  Administered 2019-05-09: 100 mg via ORAL
  Filled 2019-05-08: qty 2

## 2019-05-08 MED ORDER — TRIAMCINOLONE ACETONIDE 0.1 % EX OINT
1.0000 "application " | TOPICAL_OINTMENT | Freq: Two times a day (BID) | CUTANEOUS | Status: DC
  Filled 2019-05-08: qty 80

## 2019-05-08 NOTE — ED Triage Notes (Signed)
Pt sent from [facility] for shortness of breath and congestive heart failure. Pt states her shortness of breath and weight gain has gotten progressively worse since April. Pt had chest x-ray today showing increased fluid on lungs. Pt states her cardiologist increased her dosage of Lasix to 120 mg twice a day but she has only been able to take one dose because she is going to the bathroom too often.

## 2019-05-08 NOTE — ED Notes (Signed)
ED TO INPATIENT HANDOFF REPORT  Name/Age/Gender [name] 53 y.o. female  Code Status Code Status History    Date Active Date Inactive Code Status Order ID Comments User Context   10/27/2018 0457 10/29/2018 1813 Full Code [id number]  Rise Patience, MD Inpatient   04/17/2018 0212 04/20/2018 1613 Full Code [id number]  Norval Morton, MD ED   01/10/2018 1616 01/17/2018 1640 Full Code [id number]  Hosie Poisson, MD Inpatient   02/12/2017 0139 02/16/2017 2116 Full Code [id number]  Reubin Milan, MD Inpatient   06/16/2016 1221 06/19/2016 1424 Full Code [id number]  Jani Gravel, MD Inpatient   02/27/2016 0452 02/28/2016 1848 Full Code [id number]  Vianne Bulls, MD ED   07/08/2015 1555 07/11/2015 1710 Full Code [id number]  Modena Jansky, MD Inpatient   01/15/2015 1709 01/18/2015 1550 Full Code [id number]  Theressa Millard, MD Inpatient   09/09/2012 1634 09/10/2012 1248 Full Code [id number]  Angus Palms, RN Inpatient   Advance Care Planning Activity      Home/SNF/Other Home  Chief Complaint sob fluid on heart and lungs  Level of Care/Admitting Diagnosis ED Disposition    ED Disposition Condition [place]: [facility] [[id number]]  Level of Care: Telemetry [5]  Admit to tele based on following criteria: Acute CHF  Covid Evaluation: Confirmed COVID Negative  Diagnosis: Acute on chronic diastolic (congestive) heart failure [facility]) [[id number]]  Admitting Physician: Alma Friendly [[id number]]  Attending Physician: Alma Friendly [[id number]]  Estimated length of stay: past midnight tomorrow  Certification:: I certify this patient will need inpatient services for at least 2 midnights  PT Class (Do Not Modify): Inpatient [101]  PT Acc Code (Do Not Modify): Private [1]       Medical History Past Medical History:  Diagnosis Date  . Anemia   . Arthritis    hands, shoulders, no meds  . CHF (congestive heart failure) (HCC)     EF60-65%  . Chronic hyperventilation syndrome    w/ obesity tx with albuterol inhaler and oxygen 2L  . COPD (chronic obstructive pulmonary disease) ([place])    uses oxygen 2 L  . Gallstones   . GERD (gastroesophageal reflux disease)    diet controlled - no meds  . H/O hiatal hernia   . Hypertension   . Hypothyroidism   . Kidney stones   . Morbid obesity ([place])   . Pneumonia    hoispitalized in 08/2011  . Sarcoidosis   . Seasonal allergies   . Sleep apnea    uses CPAP machine     Allergies No Known Allergies  IV Location/Drains/Wounds Patient Lines/Drains/Airways Status   Active Line/Drains/Airways    Name:   Placement date:   Placement time:   Site:   Days:   Peripheral IV 05/08/19 Left Arm   05/08/19    1032    Arm   less than 1   Incision (Closed) 08/09/16 Perineum Other (Comment)   08/09/16    1001     1002          Labs/Imaging Results for orders placed or performed during the hospital encounter of 05/08/19 (from the past 48 hour(s))  CBC with Differential     Status: Abnormal   Collection Time: 05/08/19  9:53 AM  Result Value Ref Range   WBC 8.2 4.0 - 10.5 K/uL   RBC 4.78 3.87 - 5.11 MIL/uL   Hemoglobin 12.7 12.0 - 15.0 g/dL   HCT 42.3  36.0 - 46.0 %   MCV 88.5 80.0 - 100.0 fL   MCH 26.6 26.0 - 34.0 pg   MCHC 30.0 30.0 - 36.0 g/dL   RDW 24.0 (H) 11.5 - 15.5 %   Platelets 227 150 - 400 K/uL   nRBC 0.0 0.0 - 0.2 %   Neutrophils Relative % 92 %   Neutro Abs 7.5 1.7 - 7.7 K/uL   Lymphocytes Relative 4 %   Lymphs Abs 0.4 (L) 0.7 - 4.0 K/uL   Monocytes Relative 3 %   Monocytes Absolute 0.2 0.1 - 1.0 K/uL   Eosinophils Relative 0 %   Eosinophils Absolute 0.0 0.0 - 0.5 K/uL   Basophils Relative 0 %   Basophils Absolute 0.0 0.0 - 0.1 K/uL   Immature Granulocytes 1 %   Abs Immature Granulocytes 0.07 0.00 - 0.07 K/uL   Tear Drop Cells PRESENT    Ovalocytes PRESENT     Comment: Performed at [facility], [place] [address] [place], [place] [address]   Comprehensive metabolic panel     Status: Abnormal   Collection Time: 05/08/19  9:53 AM  Result Value Ref Range   Sodium 139 135 - 145 mmol/L   Potassium 4.8 3.5 - 5.1 mmol/L   Chloride 103 98 - 111 mmol/L   CO2 26 22 - 32 mmol/L   Glucose, Bld 170 (H) 70 - 99 mg/dL   BUN 19 6 - 20 mg/dL   Creatinine, Ser 0.86 0.44 - 1.00 mg/dL   Calcium 9.1 8.9 - 10.3 mg/dL   Total Protein 7.9 6.5 - 8.1 g/dL   Albumin 4.0 3.5 - 5.0 g/dL   AST 17 15 - 41 U/L   ALT 22 0 - 44 U/L   Alkaline Phosphatase 39 38 - 126 U/L   Total Bilirubin 0.5 0.3 - 1.2 mg/dL   GFR calc non Af Amer >60 >60 mL/min   GFR calc Af Amer >60 >60 mL/min   Anion gap 10 5 - 15    Comment: Performed at [facility], [place] [address] [place], [place] [address]  Brain natriuretic peptide     Status: None   Collection Time: 05/08/19  9:53 AM  Result Value Ref Range   B Natriuretic Peptide 54.2 0.0 - 100.0 pg/mL    Comment: Performed at [facility], [place] [address] [place], [place] [address]  hCG, quantitative, pregnancy     Status: None   Collection Time: 05/08/19  9:55 AM  Result Value Ref Range   hCG, Beta Chain, Quant, S <1 <5 mIU/mL    Comment:          GEST. AGE      CONC.  (mIU/mL)   <=1 WEEK        5 - 50     2 WEEKS       50 - 500     3 WEEKS       100 - 10,000     4 WEEKS     1,000 - 30,000     5 WEEKS     3,500 - 115,000   6-8 WEEKS     12,000 - 270,000    12 WEEKS     15,000 - 220,000        FEMALE AND NON-PREGNANT FEMALE:     LESS THAN 5 mIU/mL Performed at [facility], [place] [address] [place], [place] [address]   SARS Coronavirus 2 (CEPHEID- Performed in  [facility] hospital lab), Hosp Order     Status: None   Collection Time: 05/08/19 10:32 AM   Specimen: Nasopharyngeal Swab  Result Value Ref Range   SARS Coronavirus 2 NEGATIVE NEGATIVE    Comment: (NOTE) If result is NEGATIVE SARS-CoV-2 target nucleic acids are NOT DETECTED. The SARS-CoV-2 RNA  is generally detectable in upper and lower  respiratory specimens during the acute phase of infection. The lowest  concentration of SARS-CoV-2 viral copies this assay can detect is 250  copies / mL. A negative result does not preclude SARS-CoV-2 infection  and should not be used as the sole basis for treatment or other  patient management decisions.  A negative result may occur with  improper specimen collection / handling, submission of specimen other  than nasopharyngeal swab, presence of viral mutation(s) within the  areas targeted by this assay, and inadequate number of viral copies  (<250 copies / mL). A negative result must be combined with clinical  observations, patient history, and epidemiological information. If result is POSITIVE SARS-CoV-2 target nucleic acids are DETECTED. The SARS-CoV-2 RNA is generally detectable in upper and lower  respiratory specimens dur ing the acute phase of infection.  Positive  results are indicative of active infection with SARS-CoV-2.  Clinical  correlation with patient history and other diagnostic information is  necessary to determine patient infection status.  Positive results do  not rule out bacterial infection or co-infection with other viruses. If result is PRESUMPTIVE POSTIVE SARS-CoV-2 nucleic acids MAY BE PRESENT.   A presumptive positive result was obtained on the submitted specimen  and confirmed on repeat testing.  While 2019 novel coronavirus  (SARS-CoV-2) nucleic acids may be present in the submitted sample  additional confirmatory testing may be necessary for epidemiological  and / or clinical management purposes  to differentiate between  SARS-CoV-2 and other Sarbecovirus currently known to infect humans.  If clinically indicated additional testing with an alternate test  methodology [id number]) is advised. The SARS-CoV-2 RNA is generally  detectable in upper and lower respiratory sp ecimens during the acute  phase of  infection. The expected result is Negative. Fact Sheet for Patients:  StrictlyIdeas.no Fact Sheet for Healthcare Providers: BankingDealers.co.za This test is not yet approved or cleared by the [place] FDA and has been authorized for detection and/or diagnosis of SARS-CoV-2 by FDA under an Emergency Use Authorization (EUA).  This EUA will remain in effect (meaning this test can be used) for the duration of the COVID-19 declaration under Section 564(b)(1) of the Act, 21 U.S.C. section 360bbb-3(b)(1), unless the authorization is terminated or revoked sooner. Performed at [facility], [place] [address], [place], [place] [address]    Dg Chest 2 View  Result Date: 05/08/2019 CLINICAL DATA:  53 year old female with shortness of breath. EXAM: CHEST - 2 VIEW COMPARISON:  Chest radiograph dated 10/26/2018 FINDINGS: There is mild cardiomegaly with mild vascular congestion. No significant edema. Increased bibasilar densities, right greater left, likely combination of atelectatic changes and superimposition of the soft tissues. Developing infiltrate is less likely. Clinical correlation is recommended. No lobar consolidation, pleural effusion, or pneumothorax. No acute osseous pathology. IMPRESSION: 1. Cardiomegaly with mild vascular congestion. 2. Bibasilar densities, likely atelectasis. Developing infiltrate is less likely. Electronically Signed   By: Anner Crete M.D.   On: 05/08/2019 08:38    Pending Labs Unresulted Labs (From admission, onward)    Start     Ordered   05/08/19 1210  Troponin I (High Sensitivity)  STAT Now then every 2 hours,   R (with STAT occurrences)    Question:  Indication  Answer:  Other   05/08/19 1209   Signed and Held  Basic metabolic panel  Tomorrow morning,   R     Signed and Held   Signed and Held  CBC  Tomorrow morning,   R     Signed and Held          Vitals/Pain Today's Vitals   05/08/19  1254 05/08/19 1300 05/08/19 1330 05/08/19 1400  BP: (!) 160/72 (!) 164/78 (!) 173/65 (!) 168/72  Pulse: 68 64  (!) 59  Resp: (!) 24 12  16   Temp:      TempSrc:      SpO2: 100% 99%  100%  PainSc:        Isolation Precautions No active isolations  Medications Medications  furosemide (LASIX) injection 80 mg (80 mg Intravenous Refused 05/08/19 1033)  albuterol (VENTOLIN HFA) 108 (90 Base) MCG/ACT inhaler 1-2 puff (has no administration in time range)  calcium citrate-vitamin D (CITRACAL+D) 315-200 MG-UNIT per tablet 1 tablet (has no administration in time range)  loratadine (CLARITIN) tablet 10 mg (has no administration in time range)  Difluprednate 0.05 % EMUL 1 drop (has no administration in time range)  famotidine (PEPCID) tablet 40 mg (has no administration in time range)  folic acid (FOLVITE) tablet 1 mg (has no administration in time range)  HYDROcodone-acetaminophen (NORCO) 10-325 MG per tablet 1 tablet (has no administration in time range)  ketorolac (ACULAR) 0.5 % ophthalmic solution 1 drop (has no administration in time range)  losartan (COZAAR) tablet 100 mg (has no administration in time range)  metoprolol succinate (TOPROL-XL) 24 hr tablet 25 mg (has no administration in time range)  predniSONE (DELTASONE) tablet 10 mg (has no administration in time range)  triamcinolone ointment (KENALOG) 0.1 % 1 application (has no administration in time range)  vitamin B-12 (CYANOCOBALAMIN) tablet 1,000 mcg (has no administration in time range)  furosemide (LASIX) injection 80 mg (has no administration in time range)    Mobility walks with device

## 2019-05-08 NOTE — ED Provider Notes (Signed)
[facility] DEPT Provider Note   CSN: [id number] Arrival date & time: 05/08/19  8527  History   Chief Complaint Chief Complaint  Patient presents with  . Shortness of Breath   HPI [name] is a 53 y.o. female with medical history significant for CHF, chronic hyperventilation syndrome, GERD, morbid obesity, hypertension, sarcoidosis, apnea who presents for evaluation of shortness of breath.  Patient states she has had shortness of breath and weight gain of approximately 37 pounds since April.  Was seen by provider today who had x-ray showing concerns for increased fluid on her lungs. Cardiology has recently increased her dosage of Lasix 120 mg twice daily without improvement in sx. Unable to walk more than 2 steps without becoming extremely dyspneic and having to sit down.  Increased swelling to her lower extremities up to her knees. Denies fever, chills, nausea, vomiting, chest pain, cough, abscess, abdominal pain, diarrhea, dysuria.  Admits to lower extremity swelling which is worse than normal. Denies know COVID contacts. Cough productive of white sputum. Admits to orthopnea and PND.  History obtained from patient and past medical records. No interpretor was used.  Dry weight-- 17 pounds in last 10 days Home oxygen Use- 2L [place]  Sent from [place] for CHF exacerbation Cardiology--[place]    HPI  Past Medical History:  Diagnosis Date  . Anemia   . Arthritis    hands, shoulders, no meds  . CHF (congestive heart failure) (HCC)    EF60-65%  . Chronic hyperventilation syndrome    w/ obesity tx with albuterol inhaler and oxygen 2L  . COPD (chronic obstructive pulmonary disease) ([place])    uses oxygen 2 L  . Gallstones   . GERD (gastroesophageal reflux disease)    diet controlled - no meds  . H/O hiatal hernia   . Hypertension   . Hypothyroidism   . Kidney stones   . Morbid obesity ([place])   . Pneumonia    hoispitalized in 08/2011  .  Sarcoidosis   . Seasonal allergies   . Sleep apnea    uses CPAP machine     Patient Active Problem List   Diagnosis Date Noted  . Acute CHF (congestive heart failure) ([place]) 10/27/2018  . CHF exacerbation ([place]) 04/17/2018  . Diastolic CHF ([place]) 78/24/2353  . CHF (congestive heart failure) ([place]) 01/10/2018  . Unilateral primary osteoarthritis, left knee 02/20/2017  . Unilateral primary osteoarthritis, right knee 02/20/2017  . Chronic pain of right knee 02/20/2017  . COPD exacerbation ([place]) 02/12/2017  . GERD (gastroesophageal reflux disease) 02/12/2017  . Abnormal uterine bleeding 08/09/2016  . Diastolic dysfunction with acute on chronic heart failure ([place]) 06/17/2016  . Microcytic anemia 02/27/2016  . Abdominal pain 02/27/2016  . Chronic diastolic CHF (congestive heart failure) ([place]) 02/27/2016  . Hyperlipidemia 02/24/2016  . Chronic respiratory failure ([place]) 09/21/2015  . Absolute anemia   . Anxiety 04/14/2015  . SOB (shortness of breath)   . Acute on chronic diastolic CHF (congestive heart failure) ([place]) 01/18/2015  . Hypoxemia   . Congestive heart failure ([place])   . Symptomatic anemia 01/15/2015  . Hypothyroidism 01/15/2015  . Morbid obesity ([place]) 01/15/2015  . Fibroids 12/09/2013  . Hyperglycemia 09/16/2013  . Left knee pain 09/09/2012  . Sarcoidosis 09/09/2012  . Iron deficiency anemia due to chronic blood loss 09/09/2012  . Hypertension 02/25/2011  . Obesity hypoventilation syndrome ([place]) 02/14/2011  . OSA (obstructive sleep apnea) 02/10/2011    Past Surgical History:  Procedure Laterality Date  .  [place]   x 1  . CHOLECYSTECTOMY  2000  . DILATION AND CURETTAGE OF UTERUS N/A 08/09/2016   Procedure: DILATATION AND CURETTAGE;  Surgeon: Everitt Amber, MD;  Location: [facility];  Service: Gynecology;  Laterality: N/A;  . HYSTEROSCOPY W/D&C  12/27/2011   Procedure: DILATATION AND CURETTAGE /HYSTEROSCOPY;  Surgeon: Maeola Sarah. Landry Mellow, MD;  Location: [facility];  Service:  Gynecology;;  . I and D of abcess  05/2011  . INTRAUTERINE DEVICE (IUD) INSERTION N/A 08/09/2016   Procedure: INTRAUTERINE DEVICE (IUD) INSERTION;  Surgeon: Everitt Amber, MD;  Location: [facility];  Service: Gynecology;  Laterality: N/A;  . LUNG BIOPSY    . uterine abletion       OB History   No obstetric history on file.      Home Medications    Prior to Admission medications   Medication Sig Start Date End Date Taking? Authorizing Provider  albuterol (PROAIR HFA) 108 (90 Base) MCG/ACT inhaler Inhale 1-2 puffs into the lungs every 6 (six) hours as needed for wheezing or shortness of breath. 10/29/18   Hongalgi, Lenis Dickinson, MD  cetirizine (ZYRTEC) 10 MG tablet Take 10 mg by mouth at bedtime as needed for allergies.    [provider]  cholecalciferol (VITAMIN D) 400 UNITS TABS tablet Take 400 Units by mouth daily.    [provider]  Coral Calcium 1000 (390 CA) MG TABS Take 1,000 mg by mouth daily.     [provider]  diphenhydrAMINE (BENADRYL) 25 mg capsule Take 25 mg by mouth every 6 (six) hours as needed (fever).    [provider]  DUREZOL 0.05 % EMUL INSTILL 1 DROP INTO LEFT EYE 3 TIMES A DAY AS DIRECTED 08/01/18   [provider]  famotidine (PEPCID) 40 MG tablet Take 1 tablet (40 mg total) by mouth 2 (two) times daily. 11/13/18 02/11/19  Nafziger, Tommi Rumps, NP  folic acid (FOLVITE) 1 MG tablet Take 1 mg by mouth daily. 01/11/16   [provider]  furosemide (LASIX) 40 MG tablet Take 3 tablets (120 mg total) by mouth 2 (two) times daily. 11/19/18   Jerline Pain, MD  HYDROcodone-acetaminophen (NORCO) 10-325 MG tablet Take 1 tablet by mouth every 6 (six) hours as needed (pain). 11/21/16   Pete Pelt, PA-C  ketorolac (ACULAR) 0.5 % ophthalmic solution  08/23/18   [provider]  losartan (COZAAR) 100 MG tablet TAKE 1 TABLET (100 MG TOTAL) DAILY BY MOUTH. 02/27/19   Nafziger, Tommi Rumps, NP  methotrexate 250 MG/10ML injection once a week.  Friday 03/20/18   [provider]  metoprolol succinate (TOPROL-XL) 25 MG 24 hr tablet Take 1 tablet (25 mg total) by mouth daily. 02/27/19   Nafziger, Tommi Rumps, NP  NON FORMULARY 2 liter of oxygen    [provider]  potassium chloride SA (K-DUR,KLOR-CON) 20 MEQ tablet Take 2 tablets (40 mEq total) by mouth daily. Patient taking differently: Take 20 mEq by mouth daily.  10/30/18   Hongalgi, Lenis Dickinson, MD  predniSONE (DELTASONE) 10 MG tablet Take 1 tablet (10 mg total) by mouth daily. 01/17/18   Florencia Reasons, MD  triamcinolone acetonide (KENALOG) 40 MG/ML injection Inject 40 mg into the muscle every 3 (three) months. For knee pain    [provider]  triamcinolone ointment (KENALOG) 0.1 % Apply 1 application topically See admin instructions. Applies twice a day as needed for break out of Sarcoidosis    [provider]  vitamin B-12 1000  MCG tablet Take 1 tablet (1,000 mcg total) by mouth daily. 06/19/16   Lavina Hamman, MD    Family History Family History  Problem Relation Age of Onset  . Diabetes Father   . Cancer Father 38       colon cancer   . Diabetes Brother   . Deep vein thrombosis Mother   . Aneurysm Sister        d/o brain aneurysm    Social History Social History   Tobacco Use  . Smoking status: Never Smoker  . Smokeless tobacco: Never Used  Substance Use Topics  . Alcohol use: Yes    Comment: occasionally  . Drug use: No     Allergies   Patient has no known allergies.   Review of Systems Review of Systems  Constitutional: Negative.   HENT: Negative.   Respiratory: Positive for cough and shortness of breath. Negative for apnea, choking, chest tightness, wheezing and stridor.   Cardiovascular: Positive for leg swelling. Negative for chest pain and palpitations.  Gastrointestinal: Negative.   Genitourinary: Negative.   Musculoskeletal: Negative.   Skin: Negative.   Neurological: Negative.   All other systems reviewed and are  negative.   Physical Exam Updated Vital Signs BP 110/76   Pulse 89   Temp 99.3 F (37.4 C) (Oral)   Resp (!) 31   SpO2 99%   Physical Exam Vitals signs and nursing note reviewed.  Constitutional:      General: She is not in acute distress.    Appearance: She is obese. She is not toxic-appearing or diaphoretic.     Comments: Morbidly obese  HENT:     Head: Normocephalic and atraumatic.     Mouth/Throat:     Mouth: Mucous membranes are moist.     Pharynx: Oropharynx is clear.  Eyes:     Pupils: Pupils are equal, round, and reactive to light.  Neck:     Musculoskeletal: Normal range of motion and neck supple.  Cardiovascular:     Rate and Rhythm: Normal rate and regular rhythm.  Pulmonary:     Effort: Pulmonary effort is normal. Tachypnea present.     Comments: Mild rales in lower bases, clear without cough. Speaks in full sentences without difficulty. Abdominal:     General: Bowel sounds are normal. There is no distension.     Palpations: Abdomen is soft.     Tenderness: There is no guarding or rebound.  Musculoskeletal: Normal range of motion.     Comments: 2+ bilateral lower extremity pitting edema to mid shin  Skin:    General: Skin is warm and dry.     Capillary Refill: Capillary refill takes less than 2 seconds.  Neurological:     General: No focal deficit present.     Mental Status: She is alert.    ED Treatments / Results  Labs (all labs ordered are listed, but only abnormal results are displayed) Labs Reviewed  CBC WITH DIFFERENTIAL/PLATELET - Abnormal; Notable for the following components:      Result Value   RDW 24.0 (*)    Lymphs Abs 0.4 (*)    All other components within normal limits  COMPREHENSIVE METABOLIC PANEL - Abnormal; Notable for the following components:   Glucose, Bld 170 (*)    All other components within normal limits  SARS CORONAVIRUS 2 (HOSPITAL ORDER, [facility])  BRAIN NATRIURETIC PEPTIDE  HCG,  QUANTITATIVE, PREGNANCY  TROPONIN I (HIGH SENSITIVITY)  TROPONIN I (  HIGH SENSITIVITY)    EKG EKG Interpretation  Date/Time:  Wednesday May 08 2019 09:38:05 EDT Ventricular Rate:  79 PR Interval:    QRS Duration: 88 QT Interval:  366 QTC Calculation: 420 R Axis:   63 Text Interpretation:  Sinus rhythm Probable left atrial enlargement since last tracing no significant change Confirmed by Daleen Bo [phone]) on 05/08/2019 10:05:44 AM   Radiology Dg Chest 2 View  Result Date: 05/08/2019 CLINICAL DATA:  53 year old female with shortness of breath. EXAM: CHEST - 2 VIEW COMPARISON:  Chest radiograph dated 10/26/2018 FINDINGS: There is mild cardiomegaly with mild vascular congestion. No significant edema. Increased bibasilar densities, right greater left, likely combination of atelectatic changes and superimposition of the soft tissues. Developing infiltrate is less likely. Clinical correlation is recommended. No lobar consolidation, pleural effusion, or pneumothorax. No acute osseous pathology. IMPRESSION: 1. Cardiomegaly with mild vascular congestion. 2. Bibasilar densities, likely atelectasis. Developing infiltrate is less likely. Electronically Signed   By: Anner Crete M.D.   On: 05/08/2019 08:38    Procedures Procedures (including critical care time)  Medications Ordered in ED Medications  furosemide (LASIX) injection 80 mg (80 mg Intravenous Refused 05/08/19 1033)    Initial Impression / Assessment and Plan / ED Course  I have reviewed the triage vital signs and the nursing notes.  Pertinent labs & imaging results that were available during my care of the patient were reviewed by me and considered in my medical decision making (see chart for details).  53 year old female with 17 pound weight gain the last 10 days, 37 since April, orthopnea, lower extremity edema.  Seen by PCP today with chest x-ray with cardiomegaly and vascular congestion without gross pulmonary edema.   Suspect acute on chronic CHF exacerbation.  Has recently increased her dose of Lasix to 120 twice daily without improvement in symptoms or weight.  Highly suspect she will need admission for CHF exacerbation with fail outpatient managment.  She has no chest pain, low suspicion for ACS, PE, dissection.  Heart clear.  Lungs with minimal rales to lower lobes however exam difficult due to morbid obesity.  Abdomen soft.  No evidence of anasarca.  She does have 2+ lower extremity pitting edema up to her knees.  Currently on home oxygen of 2 L via nasal cannula.  She is mildly tachypneic.  Will give dose of IV Lasix, labs and reevaluate.  Labs and imaging personally reviewed: Chest from PCP with cardiomegaly with pulmonary congestion however no gross pulmonary edema. EKG sinus rhythm without ST/T changes  HCG negative CBC without leukocytosis CMP with mild upper glycemia at 170, no additional electrolyte, renal or liver abnormality COVID negative BNP 54.2, however given obesity think this is falsely low  1115: Tachypnic to 5, placed on 3L [place] for comfort. Patient prefers to wait on additional IV Lasix as she took 120 mg of Lasix just PTA.  1155: Continues to be tachypnic,on 3L [place]. Labs without significant findings however given 17 pound weight increase with failed diuresis outpatient think she would benefit from inpatient diuresis. Low suspicion for ACS, PE, dissection.  1211: Consulted with Dr. Horris Latino [facility] who agrees to evaluate patient in the ED for admission. Requesting troponin. Will order.  The patient appears reasonably stabilized for admission considering the current resources, flow, and capabilities available in the ED at this time, and I doubt any other [facility] requiring further screening and/or treatment in the ED prior to admission.      Final Clinical Impressions(s) / ED  Diagnoses   Final diagnoses:  Acute on chronic congestive heart failure, unspecified heart failure type [facility])  Hypoxia   Dyspnea on exertion  Hyperglycemia    ED Discharge Orders    None       Zeppelin Beckstrand A, PA-C 05/08/19 1213    Daleen Bo, MD 05/08/19 [phone]

## 2019-05-08 NOTE — Progress Notes (Signed)
Subjective:    Patient ID: [name], female    DOB: 1966/02/20, 53 y.o.   MRN: [id number]  HPI Patient presents for yearly preventative medicine examination. She is a pleasant 53 year old female who  has a past medical history of Anemia, Arthritis, CHF (congestive heart failure) ([place]), Chronic hyperventilation syndrome, COPD (chronic obstructive pulmonary disease) ([place]), Gallstones, GERD (gastroesophageal reflux disease), H/O hiatal hernia, Hypertension, Hypothyroidism, Kidney stones, Morbid obesity ([place]), Pneumonia, Sarcoidosis, Seasonal allergies, and Sleep apnea.  Treatment Team  Jerline Pain, MD as PCP - Cardiology (Cardiology) Wonda Horner, MD (Gastroenterology) Rigoberto Noel, MD as Consulting Physician (Pulmonary Disease) Gavin Pound, MD as Consulting Physician (Rheumatology)  Essential Hypertension - Controlled with Cozaar 100 mg daily and Metorpolol XR 25 mg daily.   Sarcoidosis/OSA - is managed by pulmonary. Currently prescribed methotrexate She reports that she has been having worsening shortness of breath since April. She finds it difficult to walk greater than 5 feet without having to stop, sit down and catch her breath. She continues to wear home oxygen  Chronic diastolic heart failure - is managed by Cardiology. Currently prescribed Lasix 120 mg BID.  Her most recent hospital admission was December 2019 with chronic respiratory failure.  She had a 19 pound weight gain increasing leg edema.  IV diuretics was administered she was diuresed 11 L.  She lost about 20 pounds at that admission. She reports that most days she does not take her full Lasix dose. She will take 120 mg in the morning but " if the medications actually work then I am peeing all day. If I took that dose in the evening then I wouldn't get any sleep". She reports about a 14 pound weight gain over the last two weeks.   Iron Deficiency Anemia - is managed by oncology with iron infusions. Her last  infusion was on 04/22/2019 Lab Results  Component Value Date   IRON 39 (L) 04/15/2019   TIBC 374 04/15/2019   FERRITIN 23 04/15/2019   Vitamin D Deficiency - takes Vitamin D supplement daily   Vitamin B 12 Deficiency - takes oral vitamin B supplement daily.   Obesity - sedentary lifestyle. Continues to be the cause of many of her chronic health issues   Wt Readings from Last 3 Encounters:  05/08/19 (!) 514 lb (233.1 kg)  12/04/18 (!) 483 lb 11.2 oz (219.4 kg)  11/19/18 (!) 501 lb 6.4 oz (227.4 kg)    Hyperlipidemia - not currently on medications. Has refused statin in the past Lab Results  Component Value Date   CHOL 261 (H) 03/02/2017   HDL 63.20 03/02/2017   LDLCALC 172 (H) 03/02/2017   TRIG 130.0 03/02/2017   CHOLHDL 4 03/02/2017   Hyperglycemia  Lab Results  Component Value Date   HGBA1C 6.0 03/02/2017    All immunizations and health maintenance protocols were reviewed with the patient and needed orders were placed.  Appropriate screening laboratory values were ordered for the patient including screening of hyperlipidemia, renal function and hepatic function.  Medication reconciliation,  past medical history, social history, problem list and allergies were reviewed in detail with the patient  Goals were established with regard to weight loss, exercise, and  diet in compliance with medications  She is due for a colonoscopy, mammogram and pap.    Review of Systems  Constitutional: Positive for diaphoresis.  HENT: Negative.   Eyes: Negative.   Respiratory: Positive for shortness of breath and wheezing.  Cardiovascular: Positive for leg swelling.  Gastrointestinal: Negative.   Endocrine: Negative.   Genitourinary: Negative.   Musculoskeletal: Positive for arthralgias and gait problem.  Skin: Negative.   Allergic/Immunologic: Negative.   Hematological: Negative.   Psychiatric/Behavioral: Negative.    Past Medical History:  Diagnosis Date  . Anemia   .  Arthritis    hands, shoulders, no meds  . CHF (congestive heart failure) ([facility])    EF60-65%  . Chronic hyperventilation syndrome    w/ obesity tx with albuterol inhaler and oxygen 2L  . COPD (chronic obstructive pulmonary disease) ([place])    uses oxygen 2 L  . Gallstones   . GERD (gastroesophageal reflux disease)    diet controlled - no meds  . H/O hiatal hernia   . Hypertension   . Hypothyroidism   . Kidney stones   . Morbid obesity ([place])   . Pneumonia    hoispitalized in 08/2011  . Sarcoidosis   . Seasonal allergies   . Sleep apnea    uses CPAP machine     Social History   Socioeconomic History  . Marital status: Single    Spouse name: Not on file  . Number of children: Not on file  . Years of education: Not on file  . Highest education level: Not on file  Occupational History  . Occupation: unemployeed  Social Needs  . Financial resource strain: Not on file  . Food insecurity    Worry: Not on file    Inability: Not on file  . Transportation needs    Medical: Not on file    Non-medical: Not on file  Tobacco Use  . Smoking status: Never Smoker  . Smokeless tobacco: Never Used  Substance and Sexual Activity  . Alcohol use: Yes    Comment: occasionally  . Drug use: No  . Sexual activity: Never    Birth control/protection: None  Lifestyle  . Physical activity    Days per week: Not on file    Minutes per session: Not on file  . Stress: Not on file  Relationships  . Social Herbalist on phone: Not on file    Gets together: Not on file    Attends religious service: Not on file    Active member of club or organization: Not on file    Attends meetings of clubs or organizations: Not on file    Relationship status: Not on file  . Intimate partner violence    Fear of current or ex partner: Not on file    Emotionally abused: Not on file    Physically abused: Not on file    Forced sexual activity: Not on file  Other Topics Concern  . Not on file   Social History Narrative   Son lives with patient.   Divorced for eight or nine years   Works from home for [employer].     Past Surgical History:  Procedure Laterality Date  . [place]   x 1  . CHOLECYSTECTOMY  2000  . DILATION AND CURETTAGE OF UTERUS N/A 08/09/2016   Procedure: DILATATION AND CURETTAGE;  Surgeon: Everitt Amber, MD;  Location: [facility];  Service: Gynecology;  Laterality: N/A;  . HYSTEROSCOPY W/D&C  12/27/2011   Procedure: DILATATION AND CURETTAGE /HYSTEROSCOPY;  Surgeon: Maeola Sarah. Landry Mellow, MD;  Location: [facility];  Service: Gynecology;;  . I and D of abcess  05/2011  . INTRAUTERINE DEVICE (IUD) INSERTION N/A 08/09/2016   Procedure: INTRAUTERINE  DEVICE (IUD) INSERTION;  Surgeon: Everitt Amber, MD;  Location: [facility];  Service: Gynecology;  Laterality: N/A;  . LUNG BIOPSY    . uterine abletion      Family History  Problem Relation Age of Onset  . Diabetes Father   . Cancer Father 20       colon cancer   . Diabetes Brother   . Deep vein thrombosis Mother   . Aneurysm Sister        d/o brain aneurysm    No Known Allergies  Current Outpatient Medications on File Prior to Visit  Medication Sig Dispense Refill  . albuterol (PROAIR HFA) 108 (90 Base) MCG/ACT inhaler Inhale 1-2 puffs into the lungs every 6 (six) hours as needed for wheezing or shortness of breath.    . cetirizine (ZYRTEC) 10 MG tablet Take 10 mg by mouth at bedtime as needed for allergies.    . cholecalciferol (VITAMIN D) 400 UNITS TABS tablet Take 400 Units by mouth daily.    Marland Kitchen [place] Calcium 1000 (390 CA) MG TABS Take 1,000 mg by mouth daily.     . diphenhydrAMINE (BENADRYL) 25 mg capsule Take 25 mg by mouth every 6 (six) hours as needed (fever).    . DUREZOL 0.05 % EMUL INSTILL 1 DROP INTO LEFT EYE 3 TIMES A DAY AS DIRECTED  1  . folic acid (FOLVITE) 1 MG tablet Take 1 mg by mouth daily.  3  . furosemide (LASIX) 40 MG tablet Take 3 tablets (120 mg total) by mouth 2 (two) times daily. 180 tablet 3   . HYDROcodone-acetaminophen (NORCO) 10-325 MG tablet Take 1 tablet by mouth every 6 (six) hours as needed (pain). 30 tablet 0  . ketorolac (ACULAR) 0.5 % ophthalmic solution     . losartan (COZAAR) 100 MG tablet TAKE 1 TABLET (100 MG TOTAL) DAILY BY MOUTH. 90 tablet 0  . methotrexate 250 MG/10ML injection once a week. Friday  3  . metoprolol succinate (TOPROL-XL) 25 MG 24 hr tablet Take 1 tablet (25 mg total) by mouth daily. 90 tablet 0  . NON FORMULARY 2 liter of oxygen    . potassium chloride SA (K-DUR,KLOR-CON) 20 MEQ tablet Take 2 tablets (40 mEq total) by mouth daily. (Patient taking differently: Take 20 mEq by mouth daily. ) 60 tablet 0  . predniSONE (DELTASONE) 10 MG tablet Take 1 tablet (10 mg total) by mouth daily. 10 tablet 0  . triamcinolone acetonide (KENALOG) 40 MG/ML injection Inject 40 mg into the muscle every 3 (three) months. For knee pain    . triamcinolone ointment (KENALOG) 0.1 % Apply 1 application topically See admin instructions. Applies twice a day as needed for break out of Sarcoidosis    . vitamin B-12 1000 MCG tablet Take 1 tablet (1,000 mcg total) by mouth daily. 30 tablet 0  . famotidine (PEPCID) 40 MG tablet Take 1 tablet (40 mg total) by mouth 2 (two) times daily. 180 tablet 3   No current facility-administered medications on file prior to visit.     Pulse 94   Temp 98.4 F (36.9 C) (Oral)   Wt (!) 514 lb (233.1 kg)   SpO2 93%   BMI 85.53 kg/m       Objective:   Physical Exam Vitals signs and nursing note reviewed.  Constitutional:      Appearance: She is obese. She is diaphoretic.  HENT:     Head: Normocephalic.     Right Ear: Tympanic membrane, ear canal and  external ear normal. There is no impacted cerumen.     Left Ear: Tympanic membrane, ear canal and external ear normal. There is no impacted cerumen.     Mouth/Throat:     Mouth: Mucous membranes are moist.     Pharynx: Oropharynx is clear.  Eyes:     Extraocular Movements: Extraocular  movements intact.     Pupils: Pupils are equal, round, and reactive to light.  Cardiovascular:     Rate and Rhythm: Normal rate and regular rhythm.     Pulses: Normal pulses.     Heart sounds: Normal heart sounds.  Pulmonary:     Effort: Tachypnea present.     Breath sounds: Rales (mild in bilateral bases) present.     Comments: Becomes short of breath with minimal walking.  2L via  Abdominal:     General: Abdomen is flat.     Palpations: Abdomen is soft.  Musculoskeletal:     Right lower leg: Edema present.     Left lower leg: Edema present.  Skin:    General: Skin is warm.  Neurological:     General: No focal deficit present.     Mental Status: She is alert and oriented to person, place, and time.  Psychiatric:        Mood and Affect: Mood normal.        Behavior: Behavior normal.        Thought Content: Thought content normal.        Judgment: Judgment normal.       Assessment & Plan:  1. Routine general medical examination at a health care facility  - CBC with Differential/Platelet - Hemoglobin A1c - Comprehensive metabolic panel - Lipid panel  2. Acute on chronic diastolic congestive heart failure ([place]) - Extremely SOB and diaphoretic with minimal exertion. Not at baseline. Will send to ER for further evaluation - CBC with Differential/Platelet - Hemoglobin A1c - Comprehensive metabolic panel - Lipid panel - DG Chest 2 View; Future  3. Essential hypertension - unable to get accurate BP in the office today due to SOB.  - CBC with Differential/Platelet - Hemoglobin A1c - Comprehensive metabolic panel - Lipid panel  4. OSA (obstructive sleep apnea) - Continue with pulmonary  - DG Chest 2 View; Future  5. Hyperlipidemia, unspecified hyperlipidemia type - Consider statin  - CBC with Differential/Platelet - Hemoglobin A1c - Comprehensive metabolic panel - Lipid panel  6. Hyperglycemia - Consider diabetic agent  - CBC with Differential/Platelet -  Hemoglobin A1c - Comprehensive metabolic panel - Lipid panel  7. Morbid obesity ([place]) - Needs to lose significant weight  - CBC with Differential/Platelet - Hemoglobin A1c - Comprehensive metabolic panel - Lipid panel - Vitamin D, 25-hydroxy - Vitamin B12  8. Vitamin D deficiency  - Vitamin D, 25-hydroxy  9. Vitamin B 12 deficiency  - Vitamin B12  [name], NP

## 2019-05-08 NOTE — H&P (Signed)
History and Physical  [name]:[id number] DOB: 1966/10/11 DOA: 05/08/2019  Patient coming from: Home & is able to ambulate  Chief Complaint: Shortness of breath  HPI: [name] is a 53 y.o. female with medical history significant for CHF, chronic hypoventilation syndrome, GERD, morbid obesity, sarcoidosis, COPD, hypertension, hypothyroidism, sleep apnea presents to the ED complaining of shortness of breath.  Patient reported, been chronically short of breath since April with approximately about 30 pounds weight gain.  Patient reports worsening dyspnea, unable to take 2 steps without becoming very dyspneic.  Patient also reports orthopnea and PND.  Patient reports bilateral lower extremity swelling.  Has been followed by cardiology as well as her PCP who is recently increased her Lasix without any significant improvement.  Patient denies any fever/chills, chest pain, cough, nausea/vomiting, abdominal pain, diarrhea.  Of note, patient uses 2 L of nasal cannula at home.  Patient is COVID negative.  ED Course: Patient noted to be hypertensive, afebrile, BMP within normal limits but could be falsely low due to obesity, troponin negative, EKG with no acute ST changes, x-ray with mild vascular congestion.  Patient admitted for further management, with IV diuresis  Review of Systems: Review of systems are otherwise negative   Past Medical History:  Diagnosis Date  . Anemia   . Arthritis    hands, shoulders, no meds  . CHF (congestive heart failure) ([facility])    EF60-65%  . Chronic hyperventilation syndrome    w/ obesity tx with albuterol inhaler and oxygen 2L  . COPD (chronic obstructive pulmonary disease) ([place])    uses oxygen 2 L  . Gallstones   . GERD (gastroesophageal reflux disease)    diet controlled - no meds  . H/O hiatal hernia   . Hypertension   . Hypothyroidism   . Kidney stones   . Morbid obesity ([place])   . Pneumonia    hoispitalized in 08/2011  .  Sarcoidosis   . Seasonal allergies   . Sleep apnea    uses CPAP machine    Past Surgical History:  Procedure Laterality Date  . [place]   x 1  . CHOLECYSTECTOMY  2000  . DILATION AND CURETTAGE OF UTERUS N/A 08/09/2016   Procedure: DILATATION AND CURETTAGE;  Surgeon: Everitt Amber, MD;  [facility]: [facility];  Service: Gynecology;  Laterality: N/A;  . HYSTEROSCOPY W/D&C  12/27/2011   Procedure: DILATATION AND CURETTAGE /HYSTEROSCOPY;  Surgeon: Maeola Sarah. Landry Mellow, MD;  Location: [facility];  Service: Gynecology;;  . I and D of abcess  05/2011  . INTRAUTERINE DEVICE (IUD) INSERTION N/A 08/09/2016   Procedure: INTRAUTERINE DEVICE (IUD) INSERTION;  Surgeon: Everitt Amber, MD;  [facility]: [facility];  Service: Gynecology;  Laterality: N/A;  . LUNG BIOPSY    . uterine abletion      Social History:  reports that she has never smoked. She has never used smokeless tobacco. She reports current alcohol use. She reports that she does not use drugs.   No Known Allergies  Family History  Problem Relation Age of Onset  . Diabetes Father   . Cancer Father 65       colon cancer   . Diabetes Brother   . Deep vein thrombosis Mother   . Aneurysm Sister        d/o brain aneurysm     Prior to Admission medications   Medication Sig Start Date End Date Taking? Authorizing Provider  albuterol (PROAIR HFA) 108 (90 Base) MCG/ACT inhaler  Inhale 1-2 puffs into the lungs every 6 (six) hours as needed for wheezing or shortness of breath. 10/29/18  Yes Hongalgi, Lenis Dickinson, MD  calcium citrate-vitamin D (CITRACAL+D) 315-200 MG-UNIT tablet Take 1 tablet by mouth daily.   Yes [provider]  cetirizine (ZYRTEC) 10 MG tablet Take 10 mg by mouth at bedtime as needed for allergies.   Yes [provider]  diphenhydrAMINE (BENADRYL) 25 mg capsule Take 25 mg by mouth every 6 (six) hours as needed (fever).   Yes [provider]  famotidine (PEPCID) 40 MG tablet Take 1 tablet (40 mg total) by mouth 2  (two) times daily. 11/13/18 05/08/19 Yes Nafziger, Tommi Rumps, NP  folic acid (FOLVITE) 1 MG tablet Take 1 mg by mouth daily. 01/11/16  Yes [provider]  furosemide (LASIX) 40 MG tablet Take 3 tablets (120 mg total) by mouth 2 (two) times daily. 11/19/18  Yes Jerline Pain, MD  HYDROcodone-acetaminophen (NORCO) 10-325 MG tablet Take 1 tablet by mouth every 6 (six) hours as needed (pain). 11/21/16  Yes Pete Pelt, PA-C  losartan (COZAAR) 100 MG tablet TAKE 1 TABLET (100 MG TOTAL) DAILY BY MOUTH. 02/27/19  Yes Nafziger, Tommi Rumps, NP  metoprolol succinate (TOPROL-XL) 25 MG 24 hr tablet Take 1 tablet (25 mg total) by mouth daily. 02/27/19  Yes Nafziger, Tommi Rumps, NP  NON FORMULARY 2 liter of oxygen   Yes [provider]  potassium chloride SA (K-DUR,KLOR-CON) 20 MEQ tablet Take 2 tablets (40 mEq total) by mouth daily. Patient taking differently: Take 20 mEq by mouth once a week.  10/30/18  Yes Hongalgi, Lenis Dickinson, MD  predniSONE (DELTASONE) 10 MG tablet Take 1 tablet (10 mg total) by mouth daily. 01/17/18  Yes Florencia Reasons, MD  triamcinolone acetonide (KENALOG) 40 MG/ML injection Inject 40 mg into the muscle every 3 (three) months. For knee pain   Yes [provider]  triamcinolone ointment (KENALOG) 0.1 % Apply 1 application topically 2 (two) times daily. For Sarcoidosis   Yes [provider]  vitamin B-12 1000 MCG tablet Take 1 tablet (1,000 mcg total) by mouth daily. 06/19/16  Yes Lavina Hamman, MD  DUREZOL 0.05 % EMUL Place 1 drop into the left eye 3 (three) times daily.  08/01/18   [provider]  ketorolac (ACULAR) 0.5 % ophthalmic solution  08/23/18   [provider]  methotrexate 250 MG/10ML injection Inject 20 mg into the skin once a week. Friday 03/20/18   [provider]    Physical Exam: BP (!) 176/84 (BP Location: Left Wrist)   Pulse 67   Temp 98 F (36.7 C) (Oral)   Resp (!) 22   Ht 5\' 5"  (1.651 m)   Wt (!) 224.9 kg   SpO2 100%   BMI  82.51 kg/m   General: AAO x3, NAD Eyes: Normal ENT: Normal Neck: Supple Cardiovascular: S1, S2 present Respiratory: Decreased breath sounds bilaterally, bibasilar crackles Abdomen: Soft, obese, nontender, bowel sounds present Skin: No rashes noted Musculoskeletal: 2+ bilateral pedal edema Psychiatric: Normal mood Neurologic: No focal neurologic deficits noted          Labs on Admission:  Basic Metabolic Panel: Recent Labs  Lab 05/08/19 0811 05/08/19 0953  NA 141 139  K 4.8 4.8  CL 100 103  CO2 28 26  GLUCOSE 179* 170*  BUN 19 19  CREATININE 0.97 0.86  CALCIUM 9.1 9.1   Liver Function Tests: Recent Labs  Lab 05/08/19 0811 05/08/19 0953  AST  13 17  ALT 20 22  ALKPHOS 44 39  BILITOT 0.5 0.5  PROT 7.3 7.9  ALBUMIN 4.3 4.0   No results for input(s): LIPASE, AMYLASE in the last 168 hours. No results for input(s): AMMONIA in the last 168 hours. CBC: Recent Labs  Lab 05/08/19 0811 05/08/19 0953  WBC 8.5 8.2  NEUTROABS 7.7 7.5  HGB 12.8 12.7  HCT 41.4 42.3  MCV 85.0 88.5  PLT 251.0 227   Cardiac Enzymes: No results for input(s): CKTOTAL, CKMB, CKMBINDEX, TROPONINI in the last 168 hours.  BNP (last 3 results) Recent Labs    10/26/18 2325 05/08/19 0953  BNP 52.8 54.2    ProBNP (last 3 results) No results for input(s): PROBNP in the last 8760 hours.  CBG: No results for input(s): GLUCAP in the last 168 hours.  Radiological Exams on Admission: Dg Chest 2 View  Result Date: 05/08/2019 CLINICAL DATA:  53 year old female with shortness of breath. EXAM: CHEST - 2 VIEW COMPARISON:  Chest radiograph dated 10/26/2018 FINDINGS: There is mild cardiomegaly with mild vascular congestion. No significant edema. Increased bibasilar densities, right greater left, likely combination of atelectatic changes and superimposition of the soft tissues. Developing infiltrate is less likely. Clinical correlation is recommended. No lobar consolidation, pleural effusion, or  pneumothorax. No acute osseous pathology. IMPRESSION: 1. Cardiomegaly with mild vascular congestion. 2. Bibasilar densities, likely atelectasis. Developing infiltrate is less likely. Electronically Signed   By: Anner Crete M.D.   On: 05/08/2019 08:38    EKG: Independently reviewed.  Sinus rhythm, no acute ST changes  Assessment/Plan Present on Admission: . Acute on chronic diastolic (congestive) heart failure ([place]) . Obesity hypoventilation syndrome ([place]) . Hypertension . Sarcoidosis . Morbid obesity ([place]) . Hypothyroidism . Chronic respiratory failure ([facility])  Principal Problem:   Acute on chronic diastolic (congestive) heart failure ([facility]) Active Problems:   Obesity hypoventilation syndrome ([facility])   Hypertension   Sarcoidosis   Hypothyroidism   Morbid obesity ([place])   Chronic respiratory failure ([facility])  Acute on chronic diastolic heart failure Appears overloaded, positive weight gain BNP within normal limits but could be falsely low due to morbid obesity Troponin negative, EKG with no acute ST changes Chest x-ray with vascular congestion Echo done 03/2018 showed normal EF, will repeat Start diuresis with IV Lasix 80 mg twice daily May need to consult cardiology for further management, if not diuresing adequately with Lasix Strict I's and O's, daily weights Monitor on telemetry  Acute on chronic hypoxic respiratory failure Multifactorial: Diastolic heart failure, sarcoidosis, obesity hypoventilation syndrome Supplemental oxygen, duo nebs Management as above  Hypertension Continue losartan, metoprolol IV hydralazine PRN  Prediabetes Last A1c 5.7 Diet-controlled Follow-up with PCP  Sarcoidosis Continue prednisone  OSA/obesity hypoventilation syndrome Follow-up with pulmonary  Hyperlipidemia Start Lipitor  Morbid obesity Lifestyle modification advised         DVT prophylaxis: Lovenox  Code Status: Full  Family Communication: None at  bedside  Disposition Plan: To be determined  Consults called: None  Admission status: Inpatient telemetry    Alma Friendly MD [facility]  If 7PM-7AM, please contact night-coverage [email or web address]   05/08/2019, 6:54 PM

## 2019-05-09 ENCOUNTER — Inpatient Hospital Stay (HOSPITAL_COMMUNITY): Payer: Medicare HMO

## 2019-05-09 DIAGNOSIS — D869 Sarcoidosis, unspecified: Secondary | ICD-10-CM

## 2019-05-09 DIAGNOSIS — E038 Other specified hypothyroidism: Secondary | ICD-10-CM

## 2019-05-09 DIAGNOSIS — E662 Morbid (severe) obesity with alveolar hypoventilation: Secondary | ICD-10-CM

## 2019-05-09 DIAGNOSIS — I371 Nonrheumatic pulmonary valve insufficiency: Secondary | ICD-10-CM

## 2019-05-09 DIAGNOSIS — I10 Essential (primary) hypertension: Secondary | ICD-10-CM

## 2019-05-09 DIAGNOSIS — J9611 Chronic respiratory failure with hypoxia: Secondary | ICD-10-CM

## 2019-05-09 DIAGNOSIS — I5033 Acute on chronic diastolic (congestive) heart failure: Secondary | ICD-10-CM

## 2019-05-09 LAB — CBC
HCT: 37.3 % (ref 36.0–46.0)
Hemoglobin: 11 g/dL — ABNORMAL LOW (ref 12.0–15.0)
MCH: 26.6 pg (ref 26.0–34.0)
MCHC: 29.5 g/dL — ABNORMAL LOW (ref 30.0–36.0)
MCV: 90.1 fL (ref 80.0–100.0)
Platelets: 188 10*3/uL (ref 150–400)
RBC: 4.14 MIL/uL (ref 3.87–5.11)
RDW: 24.1 % — ABNORMAL HIGH (ref 11.5–15.5)
WBC: 9.5 10*3/uL (ref 4.0–10.5)
nRBC: 0 % (ref 0.0–0.2)

## 2019-05-09 LAB — BASIC METABOLIC PANEL
Anion gap: 15 (ref 5–15)
BUN: 25 mg/dL — ABNORMAL HIGH (ref 6–20)
CO2: 26 mmol/L (ref 22–32)
Calcium: 8.6 mg/dL — ABNORMAL LOW (ref 8.9–10.3)
Chloride: 100 mmol/L (ref 98–111)
Creatinine, Ser: 0.97 mg/dL (ref 0.44–1.00)
GFR calc Af Amer: >60 mL/min (ref >60)
GFR calc non Af Amer: >60 mL/min (ref >60)
Glucose, Bld: 129 mg/dL — ABNORMAL HIGH (ref 70–99)
Potassium: 3.8 mmol/L (ref 3.5–5.1)
Sodium: 141 mmol/L (ref 135–145)

## 2019-05-09 LAB — ECHOCARDIOGRAM COMPLETE
Height: 65 in
Weight: 7933.03 oz

## 2019-05-09 MED ORDER — ENOXAPARIN SODIUM 100 MG/ML ~~LOC~~ SOLN
100.0000 mg | SUBCUTANEOUS | Status: DC
  Administered 2019-05-09 – 2019-05-11 (×3): 100 mg via SUBCUTANEOUS
  Filled 2019-05-09 (×3): qty 1

## 2019-05-09 MED ORDER — VITAMIN D (ERGOCALCIFEROL) 1.25 MG (50000 UNIT) PO CAPS
50000.0000 [IU] | ORAL_CAPSULE | ORAL | Status: DC
  Administered 2019-05-09: 50000 [IU] via ORAL
  Filled 2019-05-09: qty 1

## 2019-05-09 MED ORDER — PERFLUTREN LIPID MICROSPHERE
1.0000 mL | INTRAVENOUS | Status: AC | PRN
  Administered 2019-05-09: 2 mL via INTRAVENOUS
  Filled 2019-05-09: qty 10

## 2019-05-09 MED ORDER — ALBUTEROL SULFATE (2.5 MG/3ML) 0.083% IN NEBU
2.5000 mg | INHALATION_SOLUTION | Freq: Two times a day (BID) | RESPIRATORY_TRACT | Status: DC
  Administered 2019-05-09 – 2019-05-12 (×6): 2.5 mg via RESPIRATORY_TRACT
  Filled 2019-05-09 (×6): qty 3

## 2019-05-09 MED ORDER — VITAMIN D (ERGOCALCIFEROL) 1.25 MG (50000 UNIT) PO CAPS: 50000.0000 [IU] | ORAL_CAPSULE | ORAL | Status: DC

## 2019-05-09 NOTE — Progress Notes (Signed)
PROGRESS NOTE    [name]  KDX:[id number]  DOB: [birth year]  DOA: 05/08/2019 PCP: [name], NP   Brief Admission Hx: 53 y.o. female with medical history significant for CHF, chronic hypoventilation syndrome, GERD, morbid obesity, sarcoidosis, COPD, hypertension, hypothyroidism, sleep apnea presents to the ED complaining of shortness of breath and admitted with acute diastolic CHF exacerbation.   MDM/Assessment & Plan:   1. Acute diastolic CHF exacerbation - continue IV diuresis, down 1L, continue to monitor weights, lytes, 2D echocardiogram pending.  Pt requesting telemetry be discontinued, will DC tele, transfer to med surg.  2. Vit D deficiency - very low levels, will order Drisdol 50,000 IU twice per week.  3. Acute on chronic hypoxic respiratory failure - slowly improving with treatments, continue bronchodilators, supplemental oxygen.   4. Essential hypertension - continue losartan, hydralazine IV as needed, temporarily holding metoprolol during acute CHF exacerbation.  5. Prediabetes - controlled with diet.  6. Sarcoidosis - continue daily prednisone 10 mg.  7. OSA/obesity hypoventilation syndrome - continue nightly CPAP.  Follow up with pulmonologist.  8. Hyperlipidemia - resumed home atorvastatin.  9. Morbid obesity.   DVT prophylaxis: lovenox  Code Status: Full  Family Communication: patient updated at bedside  Disposition Plan: continue IV diuresis, inpatient    Consultants:    Procedures:  2D Echocardiogram pending  Antimicrobials:     Subjective: Pt says she is diuresing a lot and feeling a little better but still very SOB especially with ambulation.  Denies chest pain.   Objective: Vitals:   05/08/19 1545 05/08/19 1952 05/08/19 2019 05/09/19 0624  BP: (!) 176/84  (!) 154/90 (!) 142/68  Pulse: 67  77 71  Resp: (!) 22  20 20   Temp: 98 F (36.7 C)  98.5 F (36.9 C) 98.2 F (36.8 C)  TempSrc: Oral  Oral Oral  SpO2: 100% 100% 100% 100%   Weight: (!) 224.9 kg     Height: 5\' 5"  (1.651 m)       Intake/Output Summary (Last 24 hours) at 05/09/2019 9767 Last data filed at 05/08/2019 1917 Gross per 24 hour  Intake 357 ml  Output 1300 ml  Net -943 ml   Filed Weights   05/08/19 1545  Weight: (!) 224.9 kg     REVIEW OF SYSTEMS  As per history otherwise all reviewed and reported negative  Exam:  General exam: morbidly obese female, lying in bed awake, alert, NAD.  Respiratory system: Clear. No increased work of breathing. Cardiovascular system: S1 & S2 heard. No JVD, murmurs, gallops, clicks or pedal edema. Gastrointestinal system: Abdomen is nondistended, soft and nontender. Normal bowel sounds heard. Central nervous system: Alert and oriented. No focal neurological deficits. Extremities: 2+ edema bilateral LEs.  Data Reviewed: Basic Metabolic Panel: Recent Labs  Lab 05/08/19 0811 05/08/19 0953 05/09/19 0528  NA 141 139 141  K 4.8 4.8 3.8  CL 100 103 100  CO2 28 26 26   GLUCOSE 179* 170* 129*  BUN 19 19 25*  CREATININE 0.97 0.86 0.97  CALCIUM 9.1 9.1 8.6*   Liver Function Tests: Recent Labs  Lab 05/08/19 0811 05/08/19 0953  AST 13 17  ALT 20 22  ALKPHOS 44 39  BILITOT 0.5 0.5  PROT 7.3 7.9  ALBUMIN 4.3 4.0   No results for input(s): LIPASE, AMYLASE in the last 168 hours. No results for input(s): AMMONIA in the last 168 hours. CBC: Recent Labs  Lab 05/08/19 0811 05/08/19 0953 05/09/19 0528  WBC 8.5 8.2 9.5  NEUTROABS 7.7 7.5  --   HGB 12.8 12.7 11.0*  HCT 41.4 42.3 37.3  MCV 85.0 88.5 90.1  PLT 251.0 227 188   Cardiac Enzymes: No results for input(s): CKTOTAL, CKMB, CKMBINDEX, TROPONINI in the last 168 hours. CBG (last 3)  No results for input(s): GLUCAP in the last 72 hours. Recent Results (from the past 240 hour(s))  SARS Coronavirus 2 (CEPHEID- Performed in [place] hospital lab), Hosp Order     Status: None   Collection Time: 05/08/19 10:32 AM   Specimen: Nasopharyngeal Swab   Result Value Ref Range Status   SARS Coronavirus 2 NEGATIVE NEGATIVE Final    Comment: (NOTE) If result is NEGATIVE SARS-CoV-2 target nucleic acids are NOT DETECTED. The SARS-CoV-2 RNA is generally detectable in upper and lower  respiratory specimens during the acute phase of infection. The lowest  concentration of SARS-CoV-2 viral copies this assay can detect is 250  copies / mL. A negative result does not preclude SARS-CoV-2 infection  and should not be used as the sole basis for treatment or other  patient management decisions.  A negative result may occur with  improper specimen collection / handling, submission of specimen other  than nasopharyngeal swab, presence of viral mutation(s) within the  areas targeted by this assay, and inadequate number of viral copies  (<250 copies / mL). A negative result must be combined with clinical  observations, patient history, and epidemiological information. If result is POSITIVE SARS-CoV-2 target nucleic acids are DETECTED. The SARS-CoV-2 RNA is generally detectable in upper and lower  respiratory specimens dur ing the acute phase of infection.  Positive  results are indicative of active infection with SARS-CoV-2.  Clinical  correlation with patient history and other diagnostic information is  necessary to determine patient infection status.  Positive results do  not rule out bacterial infection or co-infection with other viruses. If result is PRESUMPTIVE POSTIVE SARS-CoV-2 nucleic acids MAY BE PRESENT.   A presumptive positive result was obtained on the submitted specimen  and confirmed on repeat testing.  While 2019 novel coronavirus  (SARS-CoV-2) nucleic acids may be present in the submitted sample  additional confirmatory testing may be necessary for epidemiological  and / or clinical management purposes  to differentiate between  SARS-CoV-2 and other Sarbecovirus currently known to infect humans.  If clinically indicated additional  testing with an alternate test  methodology [phone]) is advised. The SARS-CoV-2 RNA is generally  detectable in upper and lower respiratory sp ecimens during the acute  phase of infection. The expected result is Negative. Fact Sheet for Patients:  StrictlyIdeas.no Fact Sheet for Healthcare Providers: BankingDealers.co.za This test is not yet approved or cleared by the [place] FDA and has been authorized for detection and/or diagnosis of SARS-CoV-2 by FDA under an Emergency Use Authorization (EUA).  This EUA will remain in effect (meaning this test can be used) for the duration of the COVID-19 declaration under Section 564(b)(1) of the Act, 21 U.S.C. section 360bbb-3(b)(1), unless the authorization is terminated or revoked sooner. Performed at [facility], [place] [address] [place], [place] [address]      Studies: Dg Chest 2 View  Result Date: 05/08/2019 CLINICAL DATA:  53 year old female with shortness of breath. EXAM: CHEST - 2 VIEW COMPARISON:  Chest radiograph dated 10/26/2018 FINDINGS: There is mild cardiomegaly with mild vascular congestion. No significant edema. Increased bibasilar densities, right greater left, likely combination of atelectatic changes and superimposition of the soft tissues. Developing infiltrate is  less likely. Clinical correlation is recommended. No lobar consolidation, pleural effusion, or pneumothorax. No acute osseous pathology. IMPRESSION: 1. Cardiomegaly with mild vascular congestion. 2. Bibasilar densities, likely atelectasis. Developing infiltrate is less likely. Electronically Signed   By: Anner Crete M.D.   On: 05/08/2019 08:38     Scheduled Meds: . albuterol  2.5 mg Nebulization TID  . atorvastatin  40 mg Oral q1800  . calcium-vitamin D  1 tablet Oral Q breakfast  . enoxaparin (LOVENOX) injection  40 mg Subcutaneous Q24H  . famotidine  40 mg Oral BID  . folic acid  1 mg  Oral Daily  . furosemide  80 mg Intravenous Once  . furosemide  80 mg Intravenous BID  . loratadine  10 mg Oral Daily  . losartan  100 mg Oral Daily  . predniSONE  10 mg Oral Daily  . cyanocobalamin  1,000 mcg Oral Daily   Continuous Infusions:  Principal Problem:   Acute on chronic diastolic (congestive) heart failure (HCC) Active Problems:   Obesity hypoventilation syndrome ([place])   Hypertension   Sarcoidosis   Hypothyroidism   Morbid obesity ([place])   Chronic respiratory failure ([place])   Time spent:   Irwin Brakeman, MD [facility] 05/09/2019, 6:49 AM    LOS: 1 day  How to contact the [facility] Attending or Consulting provider [place] or covering provider during after hours [place], for this patient?  1. Check the care team in [facility] and look for a) attending/consulting TRH provider listed and b) the [facility] team listed 2. Log into [email or web address] and use [place]'s universal password to access. If you do not have the password, please contact the hospital operator. 3. Locate the [facility] provider you are looking for under [facility] and page to a number that you can be directly reached. 4. If you still have difficulty reaching the provider, please page the [facility] (Director on Call) for the Hospitalists listed on amion for assistance.

## 2019-05-09 NOTE — Plan of Care (Signed)

## 2019-05-09 NOTE — Progress Notes (Signed)
  Echocardiogram 2D Echocardiogram has been performed.  Aleira Deiter L Androw 05/09/2019, 3:55 PM

## 2019-05-10 ENCOUNTER — Inpatient Hospital Stay (HOSPITAL_COMMUNITY): Payer: Medicare HMO

## 2019-05-10 ENCOUNTER — Encounter (HOSPITAL_COMMUNITY): Payer: Self-pay | Admitting: Radiology

## 2019-05-10 DIAGNOSIS — R609 Edema, unspecified: Secondary | ICD-10-CM

## 2019-05-10 DIAGNOSIS — R0602 Shortness of breath: Secondary | ICD-10-CM

## 2019-05-10 LAB — BASIC METABOLIC PANEL
Anion gap: 9 (ref 5–15)
BUN: 31 mg/dL — ABNORMAL HIGH (ref 6–20)
CO2: 33 mmol/L — ABNORMAL HIGH (ref 22–32)
Calcium: 8.8 mg/dL — ABNORMAL LOW (ref 8.9–10.3)
Chloride: 99 mmol/L (ref 98–111)
Creatinine, Ser: 1.12 mg/dL — ABNORMAL HIGH (ref 0.44–1.00)
GFR calc Af Amer: >60 mL/min (ref >60)
GFR calc non Af Amer: 56 mL/min — ABNORMAL LOW (ref >60)
Glucose, Bld: 128 mg/dL — ABNORMAL HIGH (ref 70–99)
Potassium: 3.7 mmol/L (ref 3.5–5.1)
Sodium: 141 mmol/L (ref 135–145)

## 2019-05-10 LAB — MAGNESIUM: Magnesium: 1.8 mg/dL (ref 1.7–2.4)

## 2019-05-10 MED ORDER — FUROSEMIDE 10 MG/ML IJ SOLN
40.0000 mg | Freq: Two times a day (BID) | INTRAMUSCULAR | Status: DC
  Administered 2019-05-10 – 2019-05-12 (×5): 40 mg via INTRAVENOUS
  Filled 2019-05-10 (×5): qty 4

## 2019-05-10 MED ORDER — IOHEXOL 350 MG/ML SOLN
100.0000 mL | Freq: Once | INTRAVENOUS | Status: AC | PRN
  Administered 2019-05-10: 100 mL via INTRAVENOUS

## 2019-05-10 MED ORDER — SODIUM CHLORIDE (PF) 0.9 % IJ SOLN
INTRAMUSCULAR | Status: AC
  Administered 2019-05-10: 12:00:00
  Filled 2019-05-10: qty 50

## 2019-05-10 NOTE — Progress Notes (Signed)
Bilateral lower extremity venous duplex has been completed. Preliminary results can be found in CV Proc through chart review.   05/10/19 11:37 AM [name] RVT

## 2019-05-10 NOTE — Progress Notes (Signed)
PROGRESS NOTE    [name]  XLK:[id number]  DOB: 06-14-1966  DOA: 05/08/2019 PCP: [name], NP   Brief Admission Hx: 53 y.o. female with medical history significant for CHF, chronic hypoventilation syndrome, GERD, morbid obesity, sarcoidosis, COPD, hypertension, hypothyroidism, sleep apnea presents to the ED complaining of shortness of breath and admitted with acute diastolic CHF exacerbation.   MDM/Assessment & Plan:   1. Acute diastolic CHF exacerbation - continue IV diuresis, with mild bump in creatinine, reduce lasix to 40 mg BID, Hold losartan, down 3+L, continue to monitor weights, lytes, 2D echocardiogram with preserved EF.  Pt requested telemetry be discontinued.  Pt still SOB with minimal ambulation today.   2. Dyspnea - Pt reports 1 month of symptoms after recent travel.  Only minimal improvement with diuresis.  Check LE dopplers for DVT and CTA for PE.  3. Vit D deficiency - very low levels, will order Drisdol 50,000 IU twice per week.  4. Acute on chronic hypoxic respiratory failure - slowly improving with treatments, continue bronchodilators, supplemental oxygen.   5. AKI - hold losartan, reduce IV lasix to 40 mg BID.  Follow.  6. Essential hypertension - continue  hydralazine IV as needed, temporarily holding metoprolol during acute CHF exacerbation.  7. Prediabetes - controlled with diet.  8. Sarcoidosis - continue daily prednisone 10 mg.  9. OSA/obesity hypoventilation syndrome - continue nightly CPAP.  Follow up with pulmonologist.  10. Hyperlipidemia - resumed home atorvastatin.  11. Morbid obesity.   DVT prophylaxis: lovenox  Code Status: Full  Family Communication: patient updated at bedside  Disposition Plan: continue IV diuresis, inpatient   Consultants:    Procedures:  2D Echocardiogram IMPRESSIONS  1. The left ventricle has hyperdynamic systolic function, with an ejection fraction of >65%. The cavity size was normal. There is mild  concentric left ventricular hypertrophy. Left ventricular diastolic parameters were normal.  2. No evidence of mitral valve stenosis.  3. The aortic valve is tricuspid. Mild calcification of the aortic valve. No stenosis of the aortic valve.  4. The aortic arch and aortic root are normal in size and structure.  Antimicrobials:     Subjective: Pt remains SOB with minimal ambulation, says been like this for last month,  Denies chest pain.   Objective: Vitals:   05/09/19 1954 05/09/19 2216 05/10/19 0710 05/10/19 0900  BP:  136/71 127/79   Pulse:  88 66   Resp:  20 16   Temp:  98.4 F (36.9 C) 97.8 F (36.6 C)   TempSrc:  Oral Oral   SpO2: 97% 99% 100%   Weight:    (!) 230.1 kg  Height:        Intake/Output Summary (Last 24 hours) at 05/10/2019 1055 Last data filed at 05/10/2019 3664 Gross per 24 hour  Intake 240 ml  Output 4800 ml  Net -4560 ml   Filed Weights   05/08/19 1545 05/10/19 0900  Weight: (!) 224.9 kg (!) 230.1 kg   REVIEW OF SYSTEMS  As per history otherwise all reviewed and reported negative  Exam:  General exam: morbidly obese female, lying in bed awake, alert, NAD.  Respiratory system: Clear. No increased work of breathing. Cardiovascular system: S1 & S2 heard. No JVD, murmurs, gallops, clicks or pedal edema. Gastrointestinal system: Abdomen is nondistended, soft and nontender. Normal bowel sounds heard. Central nervous system: Alert and oriented. No focal neurological deficits. Extremities: 2+ edema bilateral LEs.  Data Reviewed: Basic Metabolic Panel: Recent Labs  Lab 05/08/19  7035 05/08/19 0953 05/09/19 0528 05/10/19 0518  NA 141 139 141 141  K 4.8 4.8 3.8 3.7  CL 100 103 100 99  CO2 28 26 26  33*  GLUCOSE 179* 170* 129* 128*  BUN 19 19 25* 31*  CREATININE 0.97 0.86 0.97 1.12*  CALCIUM 9.1 9.1 8.6* 8.8*  MG  --   --   --  1.8   Liver Function Tests: Recent Labs  Lab 05/08/19 0811 05/08/19 0953  AST 13 17  ALT 20 22  ALKPHOS 44 39   BILITOT 0.5 0.5  PROT 7.3 7.9  ALBUMIN 4.3 4.0   No results for input(s): LIPASE, AMYLASE in the last 168 hours. No results for input(s): AMMONIA in the last 168 hours. CBC: Recent Labs  Lab 05/08/19 0811 05/08/19 0953 05/09/19 0528  WBC 8.5 8.2 9.5  NEUTROABS 7.7 7.5  --   HGB 12.8 12.7 11.0*  HCT 41.4 42.3 37.3  MCV 85.0 88.5 90.1  PLT 251.0 227 188   Cardiac Enzymes: No results for input(s): CKTOTAL, CKMB, CKMBINDEX, TROPONINI in the last 168 hours. CBG (last 3)  No results for input(s): GLUCAP in the last 72 hours. Recent Results (from the past 240 hour(s))  SARS Coronavirus 2 (CEPHEID- Performed in [place] hospital lab), Hosp Order     Status: None   Collection Time: 05/08/19 10:32 AM   Specimen: Nasopharyngeal Swab  Result Value Ref Range Status   SARS Coronavirus 2 NEGATIVE NEGATIVE Final    Comment: (NOTE) If result is NEGATIVE SARS-CoV-2 target nucleic acids are NOT DETECTED. The SARS-CoV-2 RNA is generally detectable in upper and lower  respiratory specimens during the acute phase of infection. The lowest  concentration of SARS-CoV-2 viral copies this assay can detect is 250  copies / mL. A negative result does not preclude SARS-CoV-2 infection  and should not be used as the sole basis for treatment or other  patient management decisions.  A negative result may occur with  improper specimen collection / handling, submission of specimen other  than nasopharyngeal swab, presence of viral mutation(s) within the  areas targeted by this assay, and inadequate number of viral copies  (<250 copies / mL). A negative result must be combined with clinical  observations, patient history, and epidemiological information. If result is POSITIVE SARS-CoV-2 target nucleic acids are DETECTED. The SARS-CoV-2 RNA is generally detectable in upper and lower  respiratory specimens dur ing the acute phase of infection.  Positive  results are indicative of active infection with  SARS-CoV-2.  Clinical  correlation with patient history and other diagnostic information is  necessary to determine patient infection status.  Positive results do  not rule out bacterial infection or co-infection with other viruses. If result is PRESUMPTIVE POSTIVE SARS-CoV-2 nucleic acids MAY BE PRESENT.   A presumptive positive result was obtained on the submitted specimen  and confirmed on repeat testing.  While 2019 novel coronavirus  (SARS-CoV-2) nucleic acids may be present in the submitted sample  additional confirmatory testing may be necessary for epidemiological  and / or clinical management purposes  to differentiate between  SARS-CoV-2 and other Sarbecovirus currently known to infect humans.  If clinically indicated additional testing with an alternate test  methodology [phone]) is advised. The SARS-CoV-2 RNA is generally  detectable in upper and lower respiratory sp ecimens during the acute  phase of infection. The expected result is Negative. Fact Sheet for Patients:  StrictlyIdeas.no Fact Sheet for Healthcare Providers: BankingDealers.co.za This test is not  yet approved or cleared by the [place] and has been authorized for detection and/or diagnosis of SARS-CoV-2 by FDA under an Emergency Use Authorization (EUA).  This EUA will remain in effect (meaning this test can be used) for the duration of the COVID-19 declaration under Section 564(b)(1) of the Act, 21 U.S.C. section 360bbb-3(b)(1), unless the authorization is terminated or revoked sooner. Performed at [facility], [place] [address] [place], [place] [address]      Studies: No results found.   Scheduled Meds: . albuterol  2.5 mg Nebulization BID  . atorvastatin  40 mg Oral q1800  . calcium-vitamin D  1 tablet Oral Q breakfast  . enoxaparin (LOVENOX) injection  100 mg Subcutaneous Q24H  . famotidine  40 mg Oral BID  . folic acid  1  mg Oral Daily  . furosemide  40 mg Intravenous Q12H  . loratadine  10 mg Oral Daily  . predniSONE  10 mg Oral Daily  . cyanocobalamin  1,000 mcg Oral Daily  . Vitamin D (Ergocalciferol)  50,000 Units Oral 2 times weekly   Continuous Infusions:  Principal Problem:   Acute on chronic diastolic (congestive) heart failure (HCC) Active Problems:   Obesity hypoventilation syndrome ([place])   Hypertension   Sarcoidosis   Hypothyroidism   Morbid obesity ([place])   Chronic respiratory failure ([place])  Time spent:   Irwin Brakeman, MD [facility] 05/10/2019, 10:55 AM    LOS: 2 days  How to contact the [facility] Attending or Consulting provider [place] or covering provider during after hours [place], for this patient?  1. Check the care team in [facility] and look for a) attending/consulting TRH provider listed and b) the [facility] team listed 2. Log into [email or web address] and use [place]'s universal password to access. If you do not have the password, please contact the hospital operator. 3. Locate the [facility] provider you are looking for under [facility] and page to a number that you can be directly reached. 4. If you still have difficulty reaching the provider, please page the [facility] (Director on Call) for the Hospitalists listed on amion for assistance.

## 2019-05-10 NOTE — Care Management Important Message (Signed)
Important Message  Patient Details IM Letter given to Dessa Phi RN to present to the Patient Name: [name] MRN: [id number] Date of Birth: [birth year]   Medicare Important Message Given:  Yes     Kerin Salen 05/10/2019, 1:00 PM

## 2019-05-10 NOTE — Progress Notes (Signed)
PT Cancellation Note  Patient Details Name: [name] MRN: [id number] DOB: 10/28/1966   Cancelled Treatment:    Reason Eval/Treat Not Completed: Patient at procedure or test/unavailable(vascular lab in room. Will follow.)   Philomena Doheny PT 05/10/2019  Acute Rehabilitation Services Pager [phone] Office [phone]

## 2019-05-10 NOTE — Progress Notes (Signed)
PT Cancellation Note  Patient Details Name: [name] MRN: [id number] DOB: 1966-01-29   Cancelled Treatment:    Reason Eval/Treat Not Completed: PT screened, no needs identified, will sign off(pt stated she's independently walking to/from the bathroom and does not need PT. RN confirmed pt is mobilizing safely and independently. Encouraged pt to continue ambulating during hospitalization to minimize deconditioning.  Will sign off.)   Philomena Doheny PT 05/10/2019  Acute Rehabilitation Services Pager [phone] Office [phone]

## 2019-05-11 LAB — BASIC METABOLIC PANEL
Anion gap: 11 (ref 5–15)
BUN: 27 mg/dL — ABNORMAL HIGH (ref 6–20)
CO2: 33 mmol/L — ABNORMAL HIGH (ref 22–32)
Calcium: 9.3 mg/dL (ref 8.9–10.3)
Chloride: 96 mmol/L — ABNORMAL LOW (ref 98–111)
Creatinine, Ser: 0.86 mg/dL (ref 0.44–1.00)
GFR calc Af Amer: >60 mL/min (ref >60)
GFR calc non Af Amer: >60 mL/min (ref >60)
Glucose, Bld: 123 mg/dL — ABNORMAL HIGH (ref 70–99)
Potassium: 3.4 mmol/L — ABNORMAL LOW (ref 3.5–5.1)
Sodium: 140 mmol/L (ref 135–145)

## 2019-05-11 LAB — MAGNESIUM: Magnesium: 1.7 mg/dL (ref 1.7–2.4)

## 2019-05-11 MED ORDER — METHOTREXATE SODIUM CHEMO INJECTION 250 MG/10ML: 20.0000 mg | INTRAMUSCULAR | Status: DC

## 2019-05-11 MED ORDER — POTASSIUM CHLORIDE CRYS ER 20 MEQ PO TBCR
40.0000 meq | EXTENDED_RELEASE_TABLET | Freq: Every day | ORAL | Status: DC
  Administered 2019-05-11 – 2019-05-12 (×2): 40 meq via ORAL
  Filled 2019-05-11 (×2): qty 2

## 2019-05-11 MED ORDER — METHOTREXATE SODIUM CHEMO INJECTION 50 MG/2ML
20.0000 mg | INTRAMUSCULAR | Status: DC
  Administered 2019-05-11: 20 mg via SUBCUTANEOUS
  Filled 2019-05-11: qty 0.8

## 2019-05-11 NOTE — Progress Notes (Addendum)
PROGRESS NOTE  FAUN MCQUEEN  ZCH:[id number]  DOB: [birth year]  DOA: 05/08/2019 PCP: [name], NP  Brief Admission Hx: 53 y.o. female with medical history significant for CHF, chronic hypoventilation syndrome, GERD, morbid obesity, sarcoidosis, COPD, hypertension, hypothyroidism, sleep apnea presents to the ED complaining of shortness of breath and admitted with acute diastolic CHF exacerbation.   MDM/Assessment & Plan:   1. Acute diastolic CHF exacerbation - Pt less SOB today and she is diuresing.  She lost diuresed about 4-5L yesterday.  Continue IV diuresis, follow creatinine, Hold losartan temporarily, down 14L, continue to monitor weights, lytes, 2D echocardiogram with preserved EF.  Pt requested telemetry be discontinued.  Pt still SOB with ambulation but improved from yesterday.  Pt has been ordering takeout food in hospital.  I counseled her to please stop and asked for dietitian to meet with her.  2. Dyspnea - Pt reports 1 month of symptoms after recent travel.  Only minimal improvement with diuresis.  Check LE dopplers negative for DVT and CTA for negative PE.  3. Vit D deficiency - very low levels, will order Drisdol 50,000 IU twice per week.  4. Acute on chronic hypoxic respiratory failure - slowly improving with treatments, continue bronchodilators, supplemental oxygen.   5. AKI - hold losartan, reduce IV lasix to 40 mg BID.  Follow.  6. Essential hypertension - continue  hydralazine IV as needed, temporarily holding metoprolol during acute CHF exacerbation.  7. Prediabetes - controlled with diet.  8. Sarcoidosis - continue daily prednisone 10 mg. Resume home weekly methotrexate injections. 9. OSA/obesity hypoventilation syndrome - continue nightly CPAP.  Follow up with pulmonologist.  10. Hyperlipidemia - resumed home atorvastatin.  11. Morbid obesity.   DVT prophylaxis: lovenox  Code Status: Full  Family Communication: sister/telephone Disposition Plan: continue  IV diuresis, inpatient   Consultants:    Procedures:  2D Echocardiogram IMPRESSIONS  1. The left ventricle has hyperdynamic systolic function, with an ejection fraction of >65%. The cavity size was normal. There is mild concentric left ventricular hypertrophy. Left ventricular diastolic parameters were normal.  2. No evidence of mitral valve stenosis.  3. The aortic valve is tricuspid. Mild calcification of the aortic valve. No stenosis of the aortic valve.  4. The aortic arch and aortic root are normal in size and structure.  Antimicrobials:     Subjective: Pt says that she has been ordering take out food to hospital as she is not satisfied with diet provided by hospital.   Objective: Vitals:   05/10/19 1946 05/10/19 2149 05/11/19 0542 05/11/19 0733  BP:  (!) 153/82 (!) 157/88   Pulse: 81 82 73   Resp: 18 14 12    Temp:  98.4 F (36.9 C) 98 F (36.7 C)   TempSrc:  Oral Oral   SpO2: 97% 98% 98% 99%  Weight:   (!) 228 kg   Height:        Intake/Output Summary (Last 24 hours) at 05/11/2019 1005 Last data filed at 05/11/2019 0630 Gross per 24 hour  Intake 240 ml  Output 4950 ml  Net -4710 ml   Filed Weights   05/08/19 1545 05/10/19 0900 05/11/19 0542  Weight: (!) 224.9 kg (!) 230.1 kg (!) 228 kg   REVIEW OF SYSTEMS  As per history otherwise all reviewed and reported negative  Exam:  General exam: morbidly obese female, lying in bed awake, alert, NAD.  Respiratory system: Clear. No increased work of breathing. Cardiovascular system: S1 & S2 heard. No JVD,  murmurs, gallops, clicks or pedal edema. Gastrointestinal system: Abdomen is nondistended, soft and nontender. Normal bowel sounds heard. Central nervous system: Alert and oriented. No focal neurological deficits. Extremities: 2+ edema bilateral LEs.  Data Reviewed: Basic Metabolic Panel: Recent Labs  Lab 05/08/19 0811 05/08/19 0953 05/09/19 0528 05/10/19 0518 05/11/19 0625  NA 141 139 141 141 140  K  4.8 4.8 3.8 3.7 3.4*  CL 100 103 100 99 96*  CO2 28 26 26  33* 33*  GLUCOSE 179* 170* 129* 128* 123*  BUN 19 19 25* 31* 27*  CREATININE 0.97 0.86 0.97 1.12* 0.86  CALCIUM 9.1 9.1 8.6* 8.8* 9.3  MG  --   --   --  1.8 1.7   Liver Function Tests: Recent Labs  Lab 05/08/19 0811 05/08/19 0953  AST 13 17  ALT 20 22  ALKPHOS 44 39  BILITOT 0.5 0.5  PROT 7.3 7.9  ALBUMIN 4.3 4.0   No results for input(s): LIPASE, AMYLASE in the last 168 hours. No results for input(s): AMMONIA in the last 168 hours. CBC: Recent Labs  Lab 05/08/19 0811 05/08/19 0953 05/09/19 0528  WBC 8.5 8.2 9.5  NEUTROABS 7.7 7.5  --   HGB 12.8 12.7 11.0*  HCT 41.4 42.3 37.3  MCV 85.0 88.5 90.1  PLT 251.0 227 188   Cardiac Enzymes: No results for input(s): CKTOTAL, CKMB, CKMBINDEX, TROPONINI in the last 168 hours. CBG (last 3)  No results for input(s): GLUCAP in the last 72 hours. Recent Results (from the past 240 hour(s))  SARS Coronavirus 2 (CEPHEID- Performed in [place] hospital lab), Hosp Order     Status: None   Collection Time: 05/08/19 10:32 AM   Specimen: Nasopharyngeal Swab  Result Value Ref Range Status   SARS Coronavirus 2 NEGATIVE NEGATIVE Final    Comment: (NOTE) If result is NEGATIVE SARS-CoV-2 target nucleic acids are NOT DETECTED. The SARS-CoV-2 RNA is generally detectable in upper and lower  respiratory specimens during the acute phase of infection. The lowest  concentration of SARS-CoV-2 viral copies this assay can detect is 250  copies / mL. A negative result does not preclude SARS-CoV-2 infection  and should not be used as the sole basis for treatment or other  patient management decisions.  A negative result may occur with  improper specimen collection / handling, submission of specimen other  than nasopharyngeal swab, presence of viral mutation(s) within the  areas targeted by this assay, and inadequate number of viral copies  (<250 copies / mL). A negative result must be  combined with clinical  observations, patient history, and epidemiological information. If result is POSITIVE SARS-CoV-2 target nucleic acids are DETECTED. The SARS-CoV-2 RNA is generally detectable in upper and lower  respiratory specimens dur ing the acute phase of infection.  Positive  results are indicative of active infection with SARS-CoV-2.  Clinical  correlation with patient history and other diagnostic information is  necessary to determine patient infection status.  Positive results do  not rule out bacterial infection or co-infection with other viruses. If result is PRESUMPTIVE POSTIVE SARS-CoV-2 nucleic acids MAY BE PRESENT.   A presumptive positive result was obtained on the submitted specimen  and confirmed on repeat testing.  While 2019 novel coronavirus  (SARS-CoV-2) nucleic acids may be present in the submitted sample  additional confirmatory testing may be necessary for epidemiological  and / or clinical management purposes  to differentiate between  SARS-CoV-2 and other Sarbecovirus currently known to infect humans.  If  clinically indicated additional testing with an alternate test  methodology [id number]) is advised. The SARS-CoV-2 RNA is generally  detectable in upper and lower respiratory sp ecimens during the acute  phase of infection. The expected result is Negative. Fact Sheet for Patients:  StrictlyIdeas.no Fact Sheet for Healthcare Providers: BankingDealers.co.za This test is not yet approved or cleared by the [place] FDA and has been authorized for detection and/or diagnosis of SARS-CoV-2 by FDA under an Emergency Use Authorization (EUA).  This EUA will remain in effect (meaning this test can be used) for the duration of the COVID-19 declaration under Section 564(b)(1) of the Act, 21 U.S.C. section 360bbb-3(b)(1), unless the authorization is terminated or revoked sooner. Performed at [facility], [place] [address] [place], [place] [address]      Studies: Ct Angio Chest Pe W Or Wo Contrast  Result Date: 05/10/2019 CLINICAL DATA:  CHF, chronic hypoventilation EXAM: CT ANGIOGRAPHY CHEST WITH CONTRAST TECHNIQUE: Multidetector CT imaging of the chest was performed using the standard protocol during bolus administration of intravenous contrast. Multiplanar CT image reconstructions and MIPs were obtained to evaluate the vascular anatomy. CONTRAST:  155mL OMNIPAQUE IOHEXOL 350 MG/ML SOLN COMPARISON:  01/15/2018 FINDINGS: Cardiovascular: Satisfactory opacification of the pulmonary arteries to the segmental level. No evidence of pulmonary embolism. Cardiomegaly. No pericardial effusion. Mediastinum/Nodes: No enlarged mediastinal, hilar, or axillary lymph nodes. Thyroid gland, trachea, and esophagus demonstrate no significant findings. Lungs/Pleura: Bibasilar scarring or atelectasis on low volume, expiratory examination. No pleural effusion or pneumothorax. Upper Abdomen: No acute abnormality. Musculoskeletal: No chest wall abnormality. No acute or significant osseous findings. Review of the MIP images confirms the above findings. IMPRESSION: 1.  Negative examination for pulmonary embolism. 2. Bibasilar scarring or atelectasis on low volume, expiratory examination. 3.  Cardiomegaly. Electronically Signed   By: Eddie Candle M.D.   On: 05/10/2019 12:19   Vas [place] Lower Extremity Venous (dvt)  Result Date: 05/10/2019  Lower Venous Study Indications: Edema, and SOB.  Risk Factors: None identified. Limitations: Body habitus and poor ultrasound/tissue interface. Comparison Study: No prior studies. Performing Technologist: Oliver Hum RVT  Examination Guidelines: A complete evaluation includes B-mode imaging, spectral Doppler, color Doppler, and power Doppler as needed of all accessible portions of each vessel. Bilateral testing is considered an integral part of a complete examination. Limited  examinations for reoccurring indications may be performed as noted.  +---------+---------------+---------+-----------+----------+--------------+ RIGHT    CompressibilityPhasicitySpontaneityPropertiesSummary        +---------+---------------+---------+-----------+----------+--------------+ CFV      Full           Yes      Yes                                 +---------+---------------+---------+-----------+----------+--------------+ SFJ      Full                                                        +---------+---------------+---------+-----------+----------+--------------+ FV Prox  Full                                                        +---------+---------------+---------+-----------+----------+--------------+  FV Mid   Full                                                        +---------+---------------+---------+-----------+----------+--------------+ FV Distal               Yes      Yes                                 +---------+---------------+---------+-----------+----------+--------------+ PFV      Full                                                        +---------+---------------+---------+-----------+----------+--------------+ POP      Full           Yes      Yes                                 +---------+---------------+---------+-----------+----------+--------------+ PTV      Full                                                        +---------+---------------+---------+-----------+----------+--------------+ PERO                                                  Not visualized +---------+---------------+---------+-----------+----------+--------------+   +---------+---------------+---------+-----------+----------+--------------+ LEFT     CompressibilityPhasicitySpontaneityPropertiesSummary        +---------+---------------+---------+-----------+----------+--------------+ CFV      Full           Yes      Yes                                  +---------+---------------+---------+-----------+----------+--------------+ SFJ      Full                                                        +---------+---------------+---------+-----------+----------+--------------+ FV Prox  Full                                                        +---------+---------------+---------+-----------+----------+--------------+ FV Mid   Full           Yes      Yes                                 +---------+---------------+---------+-----------+----------+--------------+  FV Distal               Yes      Yes                                 +---------+---------------+---------+-----------+----------+--------------+ PFV      Full                                                        +---------+---------------+---------+-----------+----------+--------------+ POP      Full           Yes      Yes                                 +---------+---------------+---------+-----------+----------+--------------+ PTV      Full                                                        +---------+---------------+---------+-----------+----------+--------------+ PERO                                                  Not visualized +---------+---------------+---------+-----------+----------+--------------+     Summary: Right: There is no evidence of deep vein thrombosis in the lower extremity. However, portions of this examination were limited- see technologist comments above. No cystic structure found in the popliteal fossa. Left: There is no evidence of deep vein thrombosis in the lower extremity. However, portions of this examination were limited- see technologist comments above. No cystic structure found in the popliteal fossa.  *See table(s) above for measurements and observations.    Preliminary    Scheduled Meds: . albuterol  2.5 mg Nebulization BID  . atorvastatin  40 mg Oral q1800  . calcium-vitamin D  1 tablet Oral  Q breakfast  . enoxaparin (LOVENOX) injection  100 mg Subcutaneous Q24H  . famotidine  40 mg Oral BID  . folic acid  1 mg Oral Daily  . furosemide  40 mg Intravenous Q12H  . loratadine  10 mg Oral Daily  . potassium chloride  40 mEq Oral Daily  . predniSONE  10 mg Oral Daily  . cyanocobalamin  1,000 mcg Oral Daily  . Vitamin D (Ergocalciferol)  50,000 Units Oral 2 times weekly   Continuous Infusions:  Principal Problem:   Acute on chronic diastolic (congestive) heart failure (HCC) Active Problems:   Obesity hypoventilation syndrome ([place])   Hypertension   Sarcoidosis   Hypothyroidism   Morbid obesity ([place])   Chronic respiratory failure ([place])  Time spent:   Irwin Brakeman, MD [facility] 05/11/2019, 10:05 AM    LOS: 3 days  How to contact the [facility] Attending or Consulting provider [place] or covering provider during after hours [place], for this patient?  1. Check the care team in [facility] and look for a) attending/consulting [facility] provider listed and b) the [facility] team listed 2. Log into [email or web address] and use  [place] universal password to access. If you do not have the password, please contact the hospital operator. 3. Locate the [facility] provider you are looking for under [facility] and page to a number that you can be directly reached. 4. If you still have difficulty reaching the provider, please page the [facility] (Director on Call) for the Hospitalists listed on amion for assistance.

## 2019-05-12 LAB — BASIC METABOLIC PANEL
Anion gap: 10 (ref 5–15)
BUN: 25 mg/dL — ABNORMAL HIGH (ref 6–20)
CO2: 33 mmol/L — ABNORMAL HIGH (ref 22–32)
Calcium: 9 mg/dL (ref 8.9–10.3)
Chloride: 96 mmol/L — ABNORMAL LOW (ref 98–111)
Creatinine, Ser: 0.72 mg/dL (ref 0.44–1.00)
GFR calc Af Amer: >60 mL/min (ref >60)
GFR calc non Af Amer: >60 mL/min (ref >60)
Glucose, Bld: 106 mg/dL — ABNORMAL HIGH (ref 70–99)
Potassium: 3.5 mmol/L (ref 3.5–5.1)
Sodium: 139 mmol/L (ref 135–145)

## 2019-05-12 LAB — MAGNESIUM: Magnesium: 1.9 mg/dL (ref 1.7–2.4)

## 2019-05-12 MED ORDER — FUROSEMIDE 40 MG PO TABS: ORAL_TABLET | ORAL | 3 refills | Status: DC

## 2019-05-12 MED ORDER — VITAMIN D (ERGOCALCIFEROL) 1.25 MG (50000 UNIT) PO CAPS: 50000.0000 [IU] | ORAL_CAPSULE | ORAL | 0 refills | Status: DC

## 2019-05-12 MED ORDER — POTASSIUM CHLORIDE CRYS ER 20 MEQ PO TBCR: EXTENDED_RELEASE_TABLET | ORAL | Status: DC

## 2019-05-12 MED ORDER — ATORVASTATIN CALCIUM 40 MG PO TABS: 40.0000 mg | ORAL_TABLET | Freq: Every day | ORAL | 0 refills | Status: DC

## 2019-05-12 NOTE — Discharge Instructions (Signed)
Heart Failure, Diagnosis  Heart failure means that your heart is not able to pump blood in the right way. This makes it hard for your body to work well. Heart failure is usually a long-term (chronic) condition. You must take good care of yourself and follow your treatment plan from your doctor. What are the causes? This condition may be caused by:  High blood pressure.  Build up of cholesterol and fat in the arteries.  Heart attack. This injures the heart muscle.  Heart valves that do not open and close properly.  Damage of the heart muscle. This is also called cardiomyopathy.  Lung disease.  Abnormal heart rhythms. What increases the risk? The risk of heart failure goes up as a person ages. This condition is also more likely to develop in people who:  Are overweight.  Are female.  Smoke or chew tobacco.  Abuse alcohol or illegal drugs.  Have taken medicines that can damage the heart.  Have diabetes.  Have abnormal heart rhythms.  Have thyroid problems.  Have low blood counts (anemia). What are the signs or symptoms? Symptoms of this condition include:  Shortness of breath.  Coughing.  Swelling of the feet, ankles, legs, or belly.  Losing weight for no reason.  Trouble breathing.  Waking from sleep because of the need to sit up and get more air.  Rapid heartbeat.  Being very tired.  Feeling dizzy, or feeling like you may pass out (faint).  Having no desire to eat.  Feeling like you may vomit (nauseous).  Peeing (urinating) more at night.  Feeling confused. How is this treated?     This condition may be treated with:  Medicines. These can be given to treat blood pressure and to make the heart muscles stronger.  Changes in your daily life. These may include eating a healthy diet, staying at a healthy body weight, quitting tobacco and illegal drug use, or doing exercises.  Surgery. Surgery can be done to open blocked valves, or to put devices in  the heart, such as pacemakers.  A donor heart (heart transplant). You will receive a healthy heart from a donor. Follow these instructions at home:  Treat other conditions as told by your doctor. These may include high blood pressure, diabetes, thyroid disease, or abnormal heart rhythms.  Learn as much as you can about heart failure.  Get support as you need it.  Keep all follow-up visits as told by your doctor. This is important. Summary  Heart failure means that your heart is not able to pump blood in the right way.  This condition is caused by high blood pressure, heart attack, or damage of the heart muscle.  Symptoms of this condition include shortness of breath and swelling of the feet, ankles, legs, or belly. You may also feel very tired or feel like you may vomit.  You may be treated with medicines, surgery, or changes in your daily life.  Treat other health conditions as told by your doctor. This information is not intended to replace advice given to you by your health care provider. Make sure you discuss any questions you have with your health care provider. Document Released: 07/26/2008 Document Revised: 01/04/2019 Document Reviewed: 01/04/2019 Elsevier Patient Education  [place].   Heart Failure, Self Care Heart failure is a serious condition. This document explains the things you need to do to take care of yourself after a heart failure diagnosis. You may be asked to change your diet, take certain  medicines, and make other lifestyle changes in order to stay as healthy as possible. Your health care provider may also give you more specific instructions. If you have problems or questions, contact your health care provider. What are the risks? Having heart failure puts you at higher risk for certain problems. These problems can get worse if you do not take good care of yourself. Problems may include:  Blood clotting problems. This may cause a stroke.  Damage to  the kidneys, liver, or lungs.  Abnormal heart rhythms. Supplies needed:  Scale for monitoring weight.  Blood pressure monitor.  Notebook.  Medicines. How to care for yourself when you have heart failure Medicines Take over-the-counter and prescription medicines only as told by your health care provider. Medicines reduce the workload of your heart, slow the progression of heart failure, and improve symptoms. Take your medicines every day.  Do not stop taking your medicine unless your health care provider tells you to do so.  Do not skip any dose of medicine.  Refill your prescriptions before you run out of medicine. Eating and drinking   Eat heart-healthy foods. Talk with a dietitian to make an eating plan that is right for you. ? Choose foods that contain no trans fat and are low in saturated fat and cholesterol. Healthy choices include fresh or frozen fruits and vegetables, fish, lean meats, legumes, fat-free or low-fat dairy products, and whole-grain or high-fiber foods. ? Limit salt (sodium) if told by your health care provider. Sodium restriction may reduce symptoms of heart failure. Ask a dietitian to recommend heart-healthy seasonings. ? Use healthy cooking methods instead of frying. Healthy methods include roasting, grilling, broiling, baking, poaching, steaming, and stir-frying.  Limit your fluid intake, if directed by your health care provider. Fluid restriction may reduce symptoms of heart failure. Alcohol use  Do not drink alcohol if: ? Your health care provider tells you not to drink. ? Your heart was damaged by alcohol, or you have severe heart failure. ? You are pregnant, may be pregnant, or are planning to become pregnant.  If you drink alcohol: ? Limit how much you use to:  0-1 drink a day for women.  0-2 drinks a day for men. ? Be aware of how much alcohol is in your drink. In the [place] one drink equals one 12 oz bottle of beer (355 mL), one 5 oz glass of  wine (148 mL), or one 1 oz glass of hard liquor (44 mL). Lifestyle   Do not use any products that contain nicotine or tobacco, such as cigarettes, e-cigarettes, and chewing tobacco. If you need help quitting, ask your health care provider. ? Do not use nicotine gum or patches before talking to your health care provider.  Do not use illegal drugs.  Work with your health care provider to safely reach the right body weight.  Do physical activity if told by your health care provider. Talk to your health care provider before you begin an exercise if: ? You are an older adult. ? You have severe heart failure.  Learn to manage stress. If you need help to do this, ask your health care provider.  Participate in or seek rehabilitation as needed to keep or improve your independence and quality of life.  Plan rest periods when you get tired. Monitoring important information   Weigh yourself every day. This will help you to notice if too much fluid is building up in your body. ? Weigh yourself every morning after you  urinate and before you eat breakfast. ? Wear the same amount of clothing each time you weigh yourself. ? Record your daily weight. Provide your health care provider with your weight record.  Monitor and record your pulse and blood pressure as told by your health care provider. Dealing with extreme temperatures  If the weather is extremely hot: ? Avoid vigorous physical activity. ? Use air conditioning or fans, or find a cooler location. ? Avoid caffeine and alcohol. ? Wear loose-fitting, lightweight, and light-colored clothing.  If the weather is extremely cold: ? Avoid vigorous activity. ? Layer your clothes. ? Wear mittens or gloves, a hat, and a scarf when you go outside. ? Avoid alcohol. Follow these instructions at home:  Stay up to date with vaccines. Pneumococcal and flu (influenza) vaccines are especially important in preventing infections of the airways.  Keep  all follow-up visits as told by your health care provider. This is important. Contact a health care provider if you:  Have a rapid weight gain.  Have increasing shortness of breath.  Are unable to participate in your usual physical activities.  Get tired easily.  Cough more than normal, especially with physical activity.  Lose your appetite or feel nauseous.  Have any swelling or more swelling in areas such as your hands, feet, ankles, or abdomen.  Are unable to sleep because it is hard to breathe.  Feel like your heart is beating quickly (palpitations).  Become dizzy or light-headed when you stand up. Get help right away if you:  Have trouble breathing.  Notice or your family notices a change in your awareness, such as having trouble staying awake or concentrating.  Have pain or discomfort in your chest.  Have an episode of fainting (syncope). These symptoms may represent a serious problem that is an emergency. Do not wait to see if the symptoms will go away. Get medical help right away. Call your local emergency services (911 in the [place] Do not drive yourself to the hospital. Summary  Heart failure is a serious condition. To care for yourself, you may be asked to change your diet, take certain medicines, and make other lifestyle changes.  Take your medicines every day. Do not stop taking them unless your health care provider tells you to do so.  Eat heart-healthy foods, such as fresh or frozen fruits and vegetables, fish, lean meats, legumes, fat-free or low-fat dairy products, and whole-grain or high-fiber foods.  Ask your health care provider if you have any alcohol restrictions. You may have to stop drinking alcohol if you have severe heart failure.  Contact your health care provider if you notice problems, such as rapid weight gain or a fast heartbeat. Get help right away if you faint, or have chest pain or trouble breathing. This information is not intended to  replace advice given to you by your health care provider. Make sure you discuss any questions you have with your health care provider. Document Released: 01/30/2019 Document Revised: 01/29/2019 Document Reviewed: 01/30/2019 Elsevier Patient Education  [place].   Heart Failure Action Plan A heart failure action plan helps you understand what to do when you have symptoms of heart failure. Follow the plan that was created by you and your health care provider. Review your plan each time you visit your health care provider. Red zone These signs and symptoms mean you should get medical help right away:  You have trouble breathing when resting.  You have a dry cough that is getting  worse.  You have swelling or pain in your legs or abdomen that is getting worse.  You suddenly gain more than 2-3 lb (0.9-1.4 kg) in a day, or more than 5 lb (2.3 kg) in one week. This amount may be more or less depending on your condition.  You have trouble staying awake or you feel confused.  You have chest pain.  You do not have an appetite.  You pass out. If you experience any of these symptoms:  Call your local emergency services (911 in the [place] right away or seek help at the emergency department of the nearest hospital. Yellow zone These signs and symptoms mean your condition may be getting worse and you should make some changes:  You have trouble breathing when you are active or you need to sleep with extra pillows.  You have swelling in your legs or abdomen.  You gain 2-3 lb (0.9-1.4 kg) in one day, or 5 lb (2.3 kg) in one week. This amount may be more or less depending on your condition.  You get tired easily.  You have trouble sleeping.  You have a dry cough. If you experience any of these symptoms:  Contact your health care provider within the next day.  Your health care provider may adjust your medicines. Green zone These signs mean you are doing well and can continue what  you are doing:  You do not have shortness of breath.  You have very little swelling or no new swelling.  Your weight is stable (no gain or loss).  You have a normal activity level.  You do not have chest pain or any other new symptoms. Follow these instructions at home:  Take over-the-counter and prescription medicines only as told by your health care provider.  Weigh yourself daily. Your target weight is __________ lb (__________ kg). ? Call your health care provider if you gain more than __________ lb (__________ kg) in a day, or more than __________ lb (__________ kg) in one week.  Eat a heart-healthy diet. Work with a diet and nutrition specialist (dietitian) to create an eating plan that is best for you.  Keep all follow-up visits as told by your health care provider. This is important. Where to find more information  American Heart Association: www.heart.org Summary  Follow the action plan that was created by you and your health care provider.  Get help right away if you have any symptoms in the Red zone. This information is not intended to replace advice given to you by your health care provider. Make sure you discuss any questions you have with your health care provider. Document Released: 11/26/2016 Document Revised: 09/29/2017 Document Reviewed: 11/26/2016 Elsevier Patient Education  2020 [place].   Heart Failure Exacerbation  Heart failure is a condition in which the heart does not fill up with enough blood, and therefore does not pump enough blood and oxygen to the body. When this happens, parts of the body do not get the blood and oxygen they need to function properly. This can cause symptoms such as breathing problems, fatigue, swelling, and confusion. Heart failure exacerbation refers to heart failure symptoms that get worse. The symptoms may get worse suddenly or develop slowly over time. Heart failure exacerbation is a serious medical problem that should be  treated right away. What are the causes? A heart failure exacerbation can be triggered by:  Not taking your heart failure medicines correctly.  Infections.  Eating an unhealthy diet or a diet that  is high in salt (sodium).  Drinking too much fluid.  Drinking alcohol.  Taking illegal drugs, such as cocaine or methamphetamine.  Not exercising. Other causes include:  Other heart conditions such as an irregular heartbeat (arrhythmia).  Anemia.  Other medical problems, such as kidney failure. Sometimes the cause of the exacerbation is not known. What are the signs or symptoms? When heart failure symptoms suddenly or slowly get worse, this may be a sign of heart failure exacerbation. Symptoms of heart failure include:  Breathing problems or shortness of breath.  Chronic coughing or wheezing.  Fatigue.  Nausea or lack of appetite.  Feeling light-headed.  Confusion or memory loss.  Increased heart rate or irregular heartbeat.  Buildup of fluid in the legs, ankles, feet, or abdomen.  Difficulty breathing when lying down. How is this diagnosed? This condition is diagnosed based on:  Your symptoms and medical history.  A physical exam. You may also have tests, including:  Electrocardiogram (ECG). This test measures the electrical activity of your heart.  Echocardiogram. This test uses sound waves to take a picture of your heart to see how well it works.  Blood tests.  Imaging tests, such as: ? Chest X-ray. ? MRI. ? Ultrasound.  Stress test. This test examines how well your heart functions when you exercise. Your heart is monitored while you exercise on a treadmill or exercise bike. If you cannot exercise, medicines may be used to increase your heartbeat in place of exercise.  Cardiac catheterization. During this test, a thin, flexible tube (catheter) is inserted into a blood vessel and threaded up to your heart. This test allows your health care provider to  check the arteries that lead to your heart (coronary arteries).  Right heart catheterization. During this test, the pressure in your heart is measured. How is this treated? This condition may be treated by:  Adjusting your heart medicines.  Maintaining a healthy lifestyle. This includes: ? Eating a heart-healthy diet that is low in sodium. ? Not using any products that contain nicotine or tobacco, such as cigarettes and e-cigarettes. ? Regular exercise. ? Monitoring your fluid intake. ? Monitoring your weight and reporting changes to your health care provider.  Treating sleep apnea, if you have this condition.  Surgery. This may include: ? Implanting a device that helps both sides of your heart contract at the same time (cardiac resynchronization therapy device). This can help with heart function and relieve heart failure symptoms. ? Implanting a device that can correct heart rhythm problems (implantable cardioverter defibrillator). ? Connecting a device to your heart to help it pump blood (ventricular assist device). ? Heart transplant. Follow these instructions at home: Medicines  Take over-the-counter and prescription medicines only as told by your health care provider.  Do not stop taking your medicines or change the amount you take. If you are having problems or side effects from your medicines, talk to your health care provider.  If you are having difficulty paying for your medicines, contact a social worker or your clinic. There are many programs to assist with medicine costs.  Talk to your health care provider before starting any new medicines or supplements.  Make sure your health care provider and pharmacist have a list of all the medicines you are taking. Eating and drinking   Avoid drinking alcohol.  Eat a heart-healthy diet as told by your health care provider. This includes: ? Plenty of fruits and vegetables. ? Lean proteins. ? Low-fat dairy. ? Whole  grains. ?  Foods that are low in sodium. Activity   Exercise regularly as told by your health care provider. Balance exercise with rest.  Ask your health care provider what activities are safe for you. This includes sexual activity, exercise, and daily tasks at home or work. Lifestyle  Do not use any products that contain nicotine or tobacco, such as cigarettes and e-cigarettes. If you need help quitting, ask your health care provider.  Maintain a healthy weight. Ask your health care provider what weight is healthy for you.  Consider joining a patient support group. This can help with emotional problems you may have, such as stress and anxiety. General instructions  Talk to your health care provider about flu and pneumonia vaccines.  Keep a list of medicines that you are taking. This may help in emergency situations.  Keep all follow-up visits as told by your health care provider. This is important. Contact a health care provider if:  You have questions about your medicines or you miss a dose.  You feel anxious, depressed, or stressed.  You have swelling in your feet, ankles, legs, or abdomen.  You have shortness of breath during activity or exercise.  You have a cough.  You have a fever.  You have trouble sleeping.  You gain 2-3 lb (1-1.4 kg) in 24 hours or 5 lb (2.3 kg) in a week. Get help right away if:  You have chest pain.  You have shortness of breath while resting.  You have severe fatigue.  You are confused.  You have severe dizziness.  You have a rapid or irregular heartbeat.  You have nausea or you vomit.  You have a cough that is worse at night or you cannot lie flat.  You have a cough that will not go away.  You have severe depression or sadness. Summary  When heart failure symptoms get worse, it is called heart failure exacerbation.  Common causes of this condition include taking medicines incorrectly, infections, and drinking  alcohol.  This condition may be treated by adjusting medicines, maintaining a healthy lifestyle, or surgery.  Do not stop taking your medicines or change the amount you take. If you are having problems or side effects from your medicines, talk to your health care provider. This information is not intended to replace advice given to you by your health care provider. Make sure you discuss any questions you have with your health care provider. Document Released: 02/28/2017 Document Revised: 09/29/2017 Document Reviewed: 02/28/2017 Elsevier Patient Education  [place].   IMPORTANT INFORMATION: PAY CLOSE ATTENTION   PHYSICIAN DISCHARGE INSTRUCTIONS  Follow with Primary care provider  [name], NP  and other consultants as instructed by your Hospitalist Physician  [place] IF SYMPTOMS COME BACK, WORSEN OR NEW PROBLEM DEVELOPS   Please note: You were cared for by a hospitalist during your hospital stay. Every effort will be made to forward records to your primary care provider.  You can request that your primary care provider send for your hospital records if they have not received them.  Once you are discharged, your primary care physician will handle any further medical issues. Please note that NO REFILLS for any discharge medications will be authorized once you are discharged, as it is imperative that you return to your primary care physician (or establish a relationship with a primary care physician if you do not have one) for your post hospital discharge needs so that they can reassess your  need for medications and monitor your lab values.  Please get a complete blood count and chemistry panel checked by your Primary MD at your next visit, and again as instructed by your Primary MD.  Get Medicines reviewed and adjusted: Please take all your medications with you for your next visit with your Primary MD  Laboratory/radiological data: Please  request your Primary MD to go over all hospital tests and procedure/radiological results at the follow up, please ask your primary care provider to get all Hospital records sent to his/her office.  In some cases, they will be blood work, cultures and biopsy results pending at the time of your discharge. Please request that your primary care provider follow up on these results.  If you are diabetic, please bring your blood sugar readings with you to your follow up appointment with primary care.    Please call and make your follow up appointments as soon as possible.    Also Note the following: If you experience worsening of your admission symptoms, develop shortness of breath, life threatening emergency, suicidal or homicidal thoughts you must seek medical attention immediately by calling 911 or calling your MD immediately  if symptoms less severe.  You must read complete instructions/literature along with all the possible adverse reactions/side effects for all the Medicines you take and that have been prescribed to you. Take any new Medicines after you have completely understood and accpet all the possible adverse reactions/side effects.   Do not drive when taking Pain medications or sleeping medications (Benzodiazepines)  Do not take more than prescribed Pain, Sleep and Anxiety Medications. It is not advisable to combine anxiety,sleep and pain medications without talking with your primary care practitioner  Special Instructions: If you have smoked or chewed Tobacco  in the last 2 yrs please stop smoking, stop any regular Alcohol  and or any Recreational drug use.  Wear Seat belts while driving.  Do not drive if taking any narcotic, mind altering or controlled substances or recreational drugs or alcohol.

## 2019-05-12 NOTE — Discharge Summary (Signed)
Physician Discharge Summary  [name] WER:[id number] DOB: 04-07-1966 DOA: 05/08/2019  PCP: [name], NP  Cardiologist: [name]  Admit date: 05/08/2019 Discharge date: 05/12/2019  Admitted From:  Home  Disposition: Home   Recommendations for Outpatient Follow-up:  1. Follow up with PCP in 2 weeks 2. Follow up with cardiologist in 1 week  3. Continue daily weights and record daily urine output 4. Please obtain BMP/CBC in 1-2 weeks 5. Outpatient dietitian referral recommended regarding low sodium diet  Discharge Condition: STABLE   CODE STATUS: FULL    Brief Hospitalization Summary: Please see all hospital notes, images, labs for full details of the hospitalization. HPI: [name] is a 53 y.o. female with medical history significant for CHF, chronic hypoventilation syndrome, GERD, morbid obesity, sarcoidosis, COPD, hypertension, hypothyroidism, sleep apnea presents to the ED complaining of shortness of breath.  Patient reported, been chronically short of breath since April with approximately about 30 pounds weight gain.  Patient reports worsening dyspnea, unable to take 2 steps without becoming very dyspneic.  Patient also reports orthopnea and PND.  Patient reports bilateral lower extremity swelling.  Has been followed by cardiology as well as her PCP who is recently increased her Lasix without any significant improvement.  Patient denies any fever/chills, chest pain, cough, nausea/vomiting, abdominal pain, diarrhea.  Of note, patient uses 2 L of nasal cannula at home.  Patient is COVID negative.  ED Course: Patient noted to be hypertensive, afebrile, BMP within normal limits but could be falsely low due to obesity, troponin negative, EKG with no acute ST changes, x-ray with mild vascular congestion.  Patient admitted for further management, with IV diuresis  Brief Admission Hx: 53 y.o.femalewith medical history significant forCHF, chronic hypoventilation  syndrome, GERD, morbid obesity, sarcoidosis, COPD, hypertension,hypothyroidism, sleep apnea presents to the ED complaining of shortness of breath and admitted with acute diastolic CHF exacerbation.   MDM/Assessment & Plan:   1. Acute diastolic CHF exacerbation - Pt less SOB today and she is diuresing.  She lost diuresed about 4-5L yesterday.  Pt responded well to IV diuresis, initially was on 80 mg IV BID but then reduced to 40 mg BID after mild bump in creatinine, she has diuresed well and creatinine normalized, she has diuresed 16.14 liters, weight down to 226.1 kg, down from 230 kg,   2D echocardiogram with preserved EF.    Pt had been ordering takeout food in hospital.  I counseled her to please stop.  2. Dyspnea - Pt says that symptoms are better now after diuresis.  Pt reported 1 month of symptoms after recent travel.   LE dopplers negative for DVT and CTA for negative PE.  3. Vit D deficiency - very low levels, will order Drisdol 50,000 IU weekly.  Follow up with PCP.   4. Acute on chronic hypoxic respiratory failure - resolved now with treatments, continue bronchodilators, supplemental oxygen.   5. AKI - Resolved now.  Creatinine normalized.   6. Essential hypertension - Resume home medications.   7. Prediabetes - controlled with diet.  8. Sarcoidosis - continue daily prednisone 10 mg. Resume home weekly methotrexate injections. 9. OSA/obesity hypoventilation syndrome - continue nightly CPAP.  Follow up with pulmonologist.  10. Hyperlipidemia - atorvastatin.  11. Morbid obesity - dietitian counseling recommended for outpatient therapy   Filed Weights   05/10/19 0900 05/11/19 0542 05/12/19 0611  Weight: (!) 230.1 kg (!) 228 kg (!) 226.1 kg   DVT prophylaxis: lovenox  Code Status: Full  Family Communication: sister/telephone Disposition Plan: continue IV diuresis, inpatient   Consultants:    Procedures:  2D Echocardiogram IMPRESSIONS 1. The left ventricle has  hyperdynamic systolic function, with an ejection fraction of >65%. The cavity size was normal. There is mild concentric left ventricular hypertrophy. Left ventricular diastolic parameters were normal. 2. No evidence of mitral valve stenosis. 3. The aortic valve is tricuspid. Mild calcification of the aortic valve. No stenosis of the aortic valve. 4. The aortic arch and aortic root are normal in size and structure.   Discharge Diagnoses:  Principal Problem:   Acute on chronic diastolic (congestive) heart failure (HCC) Active Problems:   Obesity hypoventilation syndrome (HCC)   Hypertension   Sarcoidosis   Hypothyroidism   Morbid obesity ([place])   Chronic respiratory failure (HCC)   Discharge Instructions: Discharge Instructions    (HEART FAILURE PATIENTS) Call MD:  Anytime you have any of the following symptoms: 1) 3 pound weight gain in 24 hours or 5 pounds in 1 week 2) shortness of breath, with or without a dry hacking cough 3) swelling in the hands, feet or stomach 4) if you have to sleep on extra pillows at night in order to breathe.   Complete by: As directed    Call MD for:  difficulty breathing, headache or visual disturbances   Complete by: As directed    Call MD for:  extreme fatigue   Complete by: As directed    Call MD for:  persistant dizziness or light-headedness   Complete by: As directed    Call MD for:  persistant nausea and vomiting   Complete by: As directed    Diet - low sodium heart healthy   Complete by: As directed    Increase activity slowly   Complete by: As directed      Allergies as of 05/12/2019   No Known Allergies     Medication List    TAKE these medications   albuterol 108 (90 Base) MCG/ACT inhaler Commonly known as: ProAir HFA Inhale 1-2 puffs into the lungs every 6 (six) hours as needed for wheezing or shortness of breath.   atorvastatin 40 MG tablet Commonly known as: LIPITOR Take 1 tablet (40 mg total) by mouth daily at 6 PM.    calcium citrate-vitamin D 315-200 MG-UNIT tablet Commonly known as: CITRACAL+D Take 1 tablet by mouth daily.   cetirizine 10 MG tablet Commonly known as: ZYRTEC Take 10 mg by mouth at bedtime as needed for allergies.   cyanocobalamin 1000 MCG tablet Take 1 tablet (1,000 mcg total) by mouth daily.   diphenhydrAMINE 25 mg capsule Commonly known as: BENADRYL Take 25 mg by mouth every 6 (six) hours as needed (fever).   Durezol 0.05 % Emul Generic drug: Difluprednate Place 1 drop into the left eye 3 (three) times daily.   famotidine 40 MG tablet Commonly known as: PEPCID Take 1 tablet (40 mg total) by mouth 2 (two) times daily.   folic acid 1 MG tablet Commonly known as: FOLVITE Take 1 mg by mouth daily.   furosemide 40 MG tablet Commonly known as: LASIX Take as directed by cardiologist Start taking on: May 13, 2019 What changed:   how much to take  how to take this  when to take this  additional instructions   HYDROcodone-acetaminophen 10-325 MG tablet Commonly known as: NORCO Take 1 tablet by mouth every 6 (six) hours as needed (pain).   Kenalog 40 MG/ML injection Generic drug: triamcinolone acetonide Inject 40 mg  into the muscle every 3 (three) months. For knee pain   ketorolac 0.5 % ophthalmic solution Commonly known as: ACULAR   losartan 100 MG tablet Commonly known as: COZAAR TAKE 1 TABLET (100 MG TOTAL) DAILY BY MOUTH.   methotrexate 50 MG/2ML injection Inject 20 mg into the vein once a week. Dose is 0.8 mL (20mg ). Take on Fridays   metoprolol succinate 25 MG 24 hr tablet Commonly known as: TOPROL-XL Take 1 tablet (25 mg total) by mouth daily.   NON FORMULARY 2 liter of oxygen   potassium chloride SA 20 MEQ tablet Commonly known as: K-DUR Take as directed by cardiologist What changed:   how much to take  how to take this  when to take this  additional instructions   predniSONE 10 MG tablet Commonly known as: DELTASONE Take 1 tablet  (10 mg total) by mouth daily.   triamcinolone ointment 0.1 % Commonly known as: KENALOG Apply 1 application topically 2 (two) times daily. For Sarcoidosis   Vitamin D (Ergocalciferol) 1.25 MG (50000 UT) Caps capsule Commonly known as: DRISDOL Take 1 capsule (50,000 Units total) by mouth every 7 (seven) days.      Follow-up Information    [name], NP. Schedule an appointment as soon as possible for a visit in 2 week(s).   Specialty: Family Medicine Why: Hospital Follow Up  Contact information: [place] [address] [phone]        Jerline Pain, MD. Schedule an appointment as soon as possible for a visit in 1 week(s).   Specialty: Cardiology Contact information: [address] [place] [address] [phone]          No Known Allergies Allergies as of 05/12/2019   No Known Allergies     Medication List    TAKE these medications   albuterol 108 (90 Base) MCG/ACT inhaler Commonly known as: ProAir HFA Inhale 1-2 puffs into the lungs every 6 (six) hours as needed for wheezing or shortness of breath.   atorvastatin 40 MG tablet Commonly known as: LIPITOR Take 1 tablet (40 mg total) by mouth daily at 6 PM.   calcium citrate-vitamin D 315-200 MG-UNIT tablet Commonly known as: CITRACAL+D Take 1 tablet by mouth daily.   cetirizine 10 MG tablet Commonly known as: ZYRTEC Take 10 mg by mouth at bedtime as needed for allergies.   cyanocobalamin 1000 MCG tablet Take 1 tablet (1,000 mcg total) by mouth daily.   diphenhydrAMINE 25 mg capsule Commonly known as: BENADRYL Take 25 mg by mouth every 6 (six) hours as needed (fever).   Durezol 0.05 % Emul Generic drug: Difluprednate Place 1 drop into the left eye 3 (three) times daily.   famotidine 40 MG tablet Commonly known as: PEPCID Take 1 tablet (40 mg total) by mouth 2 (two) times daily.   folic acid 1 MG tablet Commonly known as: FOLVITE Take 1 mg by mouth  daily.   furosemide 40 MG tablet Commonly known as: LASIX Take as directed by cardiologist Start taking on: May 13, 2019 What changed:   how much to take  how to take this  when to take this  additional instructions   HYDROcodone-acetaminophen 10-325 MG tablet Commonly known as: NORCO Take 1 tablet by mouth every 6 (six) hours as needed (pain).   Kenalog 40 MG/ML injection Generic drug: triamcinolone acetonide Inject 40 mg into the muscle every 3 (three) months. For knee pain   ketorolac 0.5 % ophthalmic solution  Commonly known as: ACULAR   losartan 100 MG tablet Commonly known as: COZAAR TAKE 1 TABLET (100 MG TOTAL) DAILY BY MOUTH.   methotrexate 50 MG/2ML injection Inject 20 mg into the vein once a week. Dose is 0.8 mL (20mg ). Take on Fridays   metoprolol succinate 25 MG 24 hr tablet Commonly known as: TOPROL-XL Take 1 tablet (25 mg total) by mouth daily.   NON FORMULARY 2 liter of oxygen   potassium chloride SA 20 MEQ tablet Commonly known as: K-DUR Take as directed by cardiologist What changed:   how much to take  how to take this  when to take this  additional instructions   predniSONE 10 MG tablet Commonly known as: DELTASONE Take 1 tablet (10 mg total) by mouth daily.   triamcinolone ointment 0.1 % Commonly known as: KENALOG Apply 1 application topically 2 (two) times daily. For Sarcoidosis   Vitamin D (Ergocalciferol) 1.25 MG (50000 UT) Caps capsule Commonly known as: DRISDOL Take 1 capsule (50,000 Units total) by mouth every 7 (seven) days.       Procedures/Studies: Dg Chest 2 View  Result Date: 05/08/2019 CLINICAL DATA:  53 year old female with shortness of breath. EXAM: CHEST - 2 VIEW COMPARISON:  Chest radiograph dated 10/26/2018 FINDINGS: There is mild cardiomegaly with mild vascular congestion. No significant edema. Increased bibasilar densities, right greater left, likely combination of atelectatic changes and superimposition of  the soft tissues. Developing infiltrate is less likely. Clinical correlation is recommended. No lobar consolidation, pleural effusion, or pneumothorax. No acute osseous pathology. IMPRESSION: 1. Cardiomegaly with mild vascular congestion. 2. Bibasilar densities, likely atelectasis. Developing infiltrate is less likely. Electronically Signed   By: Anner Crete M.D.   On: 05/08/2019 08:38   Ct Angio Chest Pe W Or Wo Contrast  Result Date: 05/10/2019 CLINICAL DATA:  CHF, chronic hypoventilation EXAM: CT ANGIOGRAPHY CHEST WITH CONTRAST TECHNIQUE: Multidetector CT imaging of the chest was performed using the standard protocol during bolus administration of intravenous contrast. Multiplanar CT image reconstructions and MIPs were obtained to evaluate the vascular anatomy. CONTRAST:  154mL OMNIPAQUE IOHEXOL 350 MG/ML SOLN COMPARISON:  01/15/2018 FINDINGS: Cardiovascular: Satisfactory opacification of the pulmonary arteries to the segmental level. No evidence of pulmonary embolism. Cardiomegaly. No pericardial effusion. Mediastinum/Nodes: No enlarged mediastinal, hilar, or axillary lymph nodes. Thyroid gland, trachea, and esophagus demonstrate no significant findings. Lungs/Pleura: Bibasilar scarring or atelectasis on low volume, expiratory examination. No pleural effusion or pneumothorax. Upper Abdomen: No acute abnormality. Musculoskeletal: No chest wall abnormality. No acute or significant osseous findings. Review of the MIP images confirms the above findings. IMPRESSION: 1.  Negative examination for pulmonary embolism. 2. Bibasilar scarring or atelectasis on low volume, expiratory examination. 3.  Cardiomegaly. Electronically Signed   By: Eddie Candle M.D.   On: 05/10/2019 12:19   Vas [place] Lower Extremity Venous (dvt)  Result Date: 05/11/2019  Lower Venous Study Indications: Edema, and SOB.  Risk Factors: None identified. Limitations: Body habitus and poor ultrasound/tissue interface. Comparison Study: No prior  studies. Performing Technologist: Oliver Hum RVT  Examination Guidelines: A complete evaluation includes B-mode imaging, spectral Doppler, color Doppler, and power Doppler as needed of all accessible portions of each vessel. Bilateral testing is considered an integral part of a complete examination. Limited examinations for reoccurring indications may be performed as noted.  +---------+---------------+---------+-----------+----------+--------------+ RIGHT    CompressibilityPhasicitySpontaneityPropertiesSummary        +---------+---------------+---------+-----------+----------+--------------+ CFV      Full  Yes      Yes                                 +---------+---------------+---------+-----------+----------+--------------+ SFJ      Full                                                        +---------+---------------+---------+-----------+----------+--------------+ FV Prox  Full                                                        +---------+---------------+---------+-----------+----------+--------------+ FV Mid   Full                                                        +---------+---------------+---------+-----------+----------+--------------+ FV Distal               Yes      Yes                                 +---------+---------------+---------+-----------+----------+--------------+ PFV      Full                                                        +---------+---------------+---------+-----------+----------+--------------+ POP      Full           Yes      Yes                                 +---------+---------------+---------+-----------+----------+--------------+ PTV      Full                                                        +---------+---------------+---------+-----------+----------+--------------+ PERO                                                  Not visualized  +---------+---------------+---------+-----------+----------+--------------+   +---------+---------------+---------+-----------+----------+--------------+ LEFT     CompressibilityPhasicitySpontaneityPropertiesSummary        +---------+---------------+---------+-----------+----------+--------------+ CFV      Full           Yes      Yes                                 +---------+---------------+---------+-----------+----------+--------------+ SFJ      Full                                                        +---------+---------------+---------+-----------+----------+--------------+  FV Prox  Full                                                        +---------+---------------+---------+-----------+----------+--------------+ FV Mid   Full           Yes      Yes                                 +---------+---------------+---------+-----------+----------+--------------+ FV Distal               Yes      Yes                                 +---------+---------------+---------+-----------+----------+--------------+ PFV      Full                                                        +---------+---------------+---------+-----------+----------+--------------+ POP      Full           Yes      Yes                                 +---------+---------------+---------+-----------+----------+--------------+ PTV      Full                                                        +---------+---------------+---------+-----------+----------+--------------+ PERO                                                  Not visualized +---------+---------------+---------+-----------+----------+--------------+     Summary: Right: There is no evidence of deep vein thrombosis in the lower extremity. However, portions of this examination were limited- see technologist comments above. No cystic structure found in the popliteal fossa. Left: There is no evidence of deep vein thrombosis  in the lower extremity. However, portions of this examination were limited- see technologist comments above. No cystic structure found in the popliteal fossa.  *See table(s) above for measurements and observations. Electronically signed by Ruta Hinds MD on 05/11/2019 at 10:30:50 AM.    Final       Subjective: Patient says she is feeling much better.  She is agreeable to go home today.  She says that she will make every effort to follow-up with her cardiologist next week and her primary care provider.  She will continue daily weights and monitoring urine output at home.  She will continue to record this and bring to her follow-up office visits.  Discharge Exam: Vitals:   05/12/19 0615 05/12/19 0842  BP: (!) 152/80   Pulse: 75   Resp: (!) 22   Temp: 98.4 F (36.9 C)  SpO2: 96% 98%   Vitals:   05/11/19 2029 05/12/19 0611 05/12/19 0615 05/12/19 0842  BP: (!) 167/82  (!) 152/80   Pulse: 82  75   Resp: 20  (!) 22   Temp: 98.3 F (36.8 C)  98.4 F (36.9 C)   TempSrc: Oral  Oral   SpO2: 95%  96% 98%  Weight:  (!) 226.1 kg    Height:       General exam: morbidly obese female, sitting up in chair, alert, NAD.  Respiratory system: Clear. No increased work of breathing. Cardiovascular system: S1 & S2 heard. No JVD, murmurs, gallops, clicks or pedal edema. Gastrointestinal system: Abdomen is nondistended, soft and nontender. Normal bowel sounds heard. Central nervous system: Alert and oriented. No focal neurological deficits. Extremities: 1+ edema bilateral LEs.   The results of significant diagnostics from this hospitalization (including imaging, microbiology, ancillary and laboratory) are listed below for reference.     Microbiology: Recent Results (from the past 240 hour(s))  SARS Coronavirus 2 (CEPHEID- Performed in [place] hospital lab), Hosp Order     Status: None   Collection Time: 05/08/19 10:32 AM   Specimen: Nasopharyngeal Swab  Result Value Ref Range Status   SARS  Coronavirus 2 NEGATIVE NEGATIVE Final    Comment: (NOTE) If result is NEGATIVE SARS-CoV-2 target nucleic acids are NOT DETECTED. The SARS-CoV-2 RNA is generally detectable in upper and lower  respiratory specimens during the acute phase of infection. The lowest  concentration of SARS-CoV-2 viral copies this assay can detect is 250  copies / mL. A negative result does not preclude SARS-CoV-2 infection  and should not be used as the sole basis for treatment or other  patient management decisions.  A negative result may occur with  improper specimen collection / handling, submission of specimen other  than nasopharyngeal swab, presence of viral mutation(s) within the  areas targeted by this assay, and inadequate number of viral copies  (<250 copies / mL). A negative result must be combined with clinical  observations, patient history, and epidemiological information. If result is POSITIVE SARS-CoV-2 target nucleic acids are DETECTED. The SARS-CoV-2 RNA is generally detectable in upper and lower  respiratory specimens dur ing the acute phase of infection.  Positive  results are indicative of active infection with SARS-CoV-2.  Clinical  correlation with patient history and other diagnostic information is  necessary to determine patient infection status.  Positive results do  not rule out bacterial infection or co-infection with other viruses. If result is PRESUMPTIVE POSTIVE SARS-CoV-2 nucleic acids MAY BE PRESENT.   A presumptive positive result was obtained on the submitted specimen  and confirmed on repeat testing.  While 2019 novel coronavirus  (SARS-CoV-2) nucleic acids may be present in the submitted sample  additional confirmatory testing may be necessary for epidemiological  and / or clinical management purposes  to differentiate between  SARS-CoV-2 and other Sarbecovirus currently known to infect humans.  If clinically indicated additional testing with an alternate test   methodology [phone]) is advised. The SARS-CoV-2 RNA is generally  detectable in upper and lower respiratory sp ecimens during the acute  phase of infection. The expected result is Negative. Fact Sheet for Patients:  StrictlyIdeas.no Fact Sheet for Healthcare Providers: BankingDealers.co.za This test is not yet approved or cleared by the [place] FDA and has been authorized for detection and/or diagnosis of SARS-CoV-2 by FDA under an Emergency Use Authorization (EUA).  This EUA will remain in effect (meaning this test  can be used) for the duration of the COVID-19 declaration under Section 564(b)(1) of the Act, 21 U.S.C. section 360bbb-3(b)(1), unless the authorization is terminated or revoked sooner. Performed at [facility], [place] [address] [place], [place] [address]      Labs: BNP (last 3 results) Recent Labs    10/26/18 2325 05/08/19 0953  BNP 52.8 84.1   Basic Metabolic Panel: Recent Labs  Lab 05/08/19 0953 05/09/19 0528 05/10/19 0518 05/11/19 0625 05/12/19 0556  NA 139 141 141 140 139  K 4.8 3.8 3.7 3.4* 3.5  CL 103 100 99 96* 96*  CO2 26 26 33* 33* 33*  GLUCOSE 170* 129* 128* 123* 106*  BUN 19 25* 31* 27* 25*  CREATININE 0.86 0.97 1.12* 0.86 0.72  CALCIUM 9.1 8.6* 8.8* 9.3 9.0  MG  --   --  1.8 1.7 1.9   Liver Function Tests: Recent Labs  Lab 05/08/19 0811 05/08/19 0953  AST 13 17  ALT 20 22  ALKPHOS 44 39  BILITOT 0.5 0.5  PROT 7.3 7.9  ALBUMIN 4.3 4.0   No results for input(s): LIPASE, AMYLASE in the last 168 hours. No results for input(s): AMMONIA in the last 168 hours. CBC: Recent Labs  Lab 05/08/19 0811 05/08/19 0953 05/09/19 0528  WBC 8.5 8.2 9.5  NEUTROABS 7.7 7.5  --   HGB 12.8 12.7 11.0*  HCT 41.4 42.3 37.3  MCV 85.0 88.5 90.1  PLT 251.0 227 188   Cardiac Enzymes: No results for input(s): CKTOTAL, CKMB, CKMBINDEX, TROPONINI in the last 168  hours. BNP: Invalid input(s): POCBNP CBG: No results for input(s): GLUCAP in the last 168 hours. D-Dimer No results for input(s): DDIMER in the last 72 hours. Hgb A1c No results for input(s): HGBA1C in the last 72 hours. Lipid Profile No results for input(s): CHOL, HDL, LDLCALC, TRIG, CHOLHDL, LDLDIRECT in the last 72 hours. Thyroid function studies No results for input(s): TSH, T4TOTAL, T3FREE, THYROIDAB in the last 72 hours.  Invalid input(s): FREET3 Anemia work up No results for input(s): VITAMINB12, FOLATE, FERRITIN, TIBC, IRON, RETICCTPCT in the last 72 hours. Urinalysis    Component Value Date/Time   COLORURINE YELLOW 06/16/2016 0100   APPEARANCEUR CLOUDY (A) 06/16/2016 0100   LABSPEC 1.021 06/16/2016 0100   PHURINE 6.0 06/16/2016 0100   GLUCOSEU NEGATIVE 06/16/2016 0100   HGBUR LARGE (A) 06/16/2016 0100   BILIRUBINUR NEGATIVE 06/16/2016 0100   KETONESUR NEGATIVE 06/16/2016 0100   PROTEINUR NEGATIVE 06/16/2016 0100   UROBILINOGEN 0.2 09/10/2012 0106   NITRITE NEGATIVE 06/16/2016 0100   LEUKOCYTESUR NEGATIVE 06/16/2016 0100   Sepsis Labs Invalid input(s): PROCALCITONIN,  WBC,  LACTICIDVEN Microbiology Recent Results (from the past 240 hour(s))  SARS Coronavirus 2 (CEPHEID- Performed in [place] hospital lab), Hosp Order     Status: None   Collection Time: 05/08/19 10:32 AM   Specimen: Nasopharyngeal Swab  Result Value Ref Range Status   SARS Coronavirus 2 NEGATIVE NEGATIVE Final    Comment: (NOTE) If result is NEGATIVE SARS-CoV-2 target nucleic acids are NOT DETECTED. The SARS-CoV-2 RNA is generally detectable in upper and lower  respiratory specimens during the acute phase of infection. The lowest  concentration of SARS-CoV-2 viral copies this assay can detect is 250  copies / mL. A negative result does not preclude SARS-CoV-2 infection  and should not be used as the sole basis for treatment or other  patient management decisions.  A negative result may  occur with  improper specimen collection /  handling, submission of specimen other  than nasopharyngeal swab, presence of viral mutation(s) within the  areas targeted by this assay, and inadequate number of viral copies  (<250 copies / mL). A negative result must be combined with clinical  observations, patient history, and epidemiological information. If result is POSITIVE SARS-CoV-2 target nucleic acids are DETECTED. The SARS-CoV-2 RNA is generally detectable in upper and lower  respiratory specimens dur ing the acute phase of infection.  Positive  results are indicative of active infection with SARS-CoV-2.  Clinical  correlation with patient history and other diagnostic information is  necessary to determine patient infection status.  Positive results do  not rule out bacterial infection or co-infection with other viruses. If result is PRESUMPTIVE POSTIVE SARS-CoV-2 nucleic acids MAY BE PRESENT.   A presumptive positive result was obtained on the submitted specimen  and confirmed on repeat testing.  While 2019 novel coronavirus  (SARS-CoV-2) nucleic acids may be present in the submitted sample  additional confirmatory testing may be necessary for epidemiological  and / or clinical management purposes  to differentiate between  SARS-CoV-2 and other Sarbecovirus currently known to infect humans.  If clinically indicated additional testing with an alternate test  methodology [phone]) is advised. The SARS-CoV-2 RNA is generally  detectable in upper and lower respiratory sp ecimens during the acute  phase of infection. The expected result is Negative. Fact Sheet for Patients:  StrictlyIdeas.no Fact Sheet for Healthcare Providers: BankingDealers.co.za This test is not yet approved or cleared by the [place] FDA and has been authorized for detection and/or diagnosis of SARS-CoV-2 by FDA under an Emergency Use Authorization (EUA).  This  EUA will remain in effect (meaning this test can be used) for the duration of the COVID-19 declaration under Section 564(b)(1) of the Act, 21 U.S.C. section 360bbb-3(b)(1), unless the authorization is terminated or revoked sooner. Performed at [facility], [place] [address] [place], [place] [address]    Time coordinating discharge: 35 minutes   SIGNED:  Irwin Brakeman, MD  [facility] 05/12/2019, 11:27 AM How to contact the [facility] Attending or Consulting provider [place] or covering provider during after hours [place], for this patient?  1. Check the care team in [facility] and look for a) attending/consulting TRH provider listed and b) the [facility] team listed 2. Log into [email or web address] and use [place]'s universal password to access. If you do not have the password, please contact the hospital operator. 3. Locate the [facility] provider you are looking for under [facility] and Kim to a number that you can be directly reached. 4. If you still have difficulty reaching the provider, please Kim the [facility] (Director on Call) for the Hospitalists listed on amion for assistance.

## 2019-05-21 ENCOUNTER — Encounter: Payer: Self-pay | Admitting: Adult Health

## 2019-05-21 ENCOUNTER — Other Ambulatory Visit: Payer: Self-pay

## 2019-05-21 ENCOUNTER — Ambulatory Visit (INDEPENDENT_AMBULATORY_CARE_PROVIDER_SITE_OTHER): Payer: Medicare HMO | Admitting: Adult Health

## 2019-05-21 DIAGNOSIS — I10 Essential (primary) hypertension: Secondary | ICD-10-CM

## 2019-05-21 DIAGNOSIS — R739 Hyperglycemia, unspecified: Secondary | ICD-10-CM

## 2019-05-21 DIAGNOSIS — E785 Hyperlipidemia, unspecified: Secondary | ICD-10-CM

## 2019-05-21 DIAGNOSIS — E559 Vitamin D deficiency, unspecified: Secondary | ICD-10-CM | POA: Diagnosis not present

## 2019-05-21 DIAGNOSIS — G4733 Obstructive sleep apnea (adult) (pediatric): Secondary | ICD-10-CM | POA: Diagnosis not present

## 2019-05-21 DIAGNOSIS — I5033 Acute on chronic diastolic (congestive) heart failure: Secondary | ICD-10-CM | POA: Diagnosis not present

## 2019-05-21 MED ORDER — VITAMIN D (ERGOCALCIFEROL) 1.25 MG (50000 UNIT) PO CAPS: 50000.0000 [IU] | ORAL_CAPSULE | ORAL | 1 refills | Status: DC

## 2019-05-21 MED ORDER — ATORVASTATIN CALCIUM 40 MG PO TABS: 40.0000 mg | ORAL_TABLET | Freq: Every day | ORAL | 1 refills | Status: DC

## 2019-05-21 NOTE — Progress Notes (Signed)
Virtual Visit via Video Note  I connected with [name] on 05/21/19 at  2:00 PM EDT by a video enabled telemedicine application and verified that I am speaking with the correct person using two identifiers.  Location patient: home Location provider:work or home office Persons participating in the virtual visit: patient, provider  I discussed the limitations of evaluation and management by telemedicine and the availability of in person appointments. The patient expressed understanding and agreed to proceed.   HPI: TCM Visit   Admit Date 05/08/2019 Discharge Date 05/12/2019  After being seen at this office on 05/08/2019 she was sent to the emergency room for shortness of breath.  She had been chronically short of breath since about April with approximately 30 pound weight gain.  She reported worsening dyspnea, unable to take more than 2 steps without becoming very dyspneic.  She also reported Ortho pia and PND.  She reported bilateral lower extremity swelling.  In the past she has been followed by cardiology as well as myself, Lasix had been increased without significant improvement.  In the ER she was noted to be hypertensive, afebrile, BNP within normal limits but could have been falsely low due to obesity.,  Troponin was negative, EKG with no acute ST changes, x-ray with mild vascular congestion.  She was admitted for acute diastolic CHF exacerbation and further management with IV diuresis  Hospital Course   1.  Acute diastolic CHF exacerbation-she responded well to IV diuresis, was initially on 80 mg IV twice daily but then reduced to 40 mg twice daily after mild bump in creatinine.  Her creatinine normalized and she was diuresed 16.14 L, weight down advised continue home medication regimen from 514 lbs to 498 lbs. he had a 2D echocardiogram done which showed preserved EF.  In the hospital she was ordering takeout food and was counseled to stop.  She was discharged on Lasix 40 mg twice  daily  2. Dyspnea -symptoms improved after diuresis.  Lower extremity Dopplers were negative for DVT and CTA was negative for PE  3. Vitamin D Def-area levels.  Was placed on 50,000 units weekly in the hospital  4.  Acute on chronic hypoxic respiratory failure-resolved with treatments, advised continue bronchodilators and supplemental oxygen  5. AKI -resolved upon discharge  6. Essential Hypertension -continue home medication regimen  7. Pre diabetes - controlled with diet  8. Sarcoidosis -continue daily prednisone 10 mg.  Resume home weekly methotrexate injections  9. OSA-continue nightly CPAP.  Advise follow-up with pulmonary  10. Hyperlipidemia -was started on atorvastatin 40 mg daily  11. Morbid Obesity -dietitian counseling recommended as outpatient therapy  Today during this visit she reports that I feel so much better".  She has been keeping daily weights and her weight has not increased, she continues to lose a little bit of weight daily.  No longer feeling as short of breath, and has actually been able to get outside.  She has been taking her new medications(atorvastatin and vitamin D 50,000 units) without adverse effects.  She denies fevers, chills, chest pain, or feeling acutely ill.  ROS: See pertinent positives and negatives per HPI.  Past Medical History:  Diagnosis Date  . Anemia   . Arthritis    hands, shoulders, no meds  . CHF (congestive heart failure) (HCC)    EF60-65%  . Chronic hyperventilation syndrome    w/ obesity tx with albuterol inhaler and oxygen 2L  . COPD (chronic obstructive pulmonary disease) ([place])    uses  oxygen 2 L  . Gallstones   . GERD (gastroesophageal reflux disease)    diet controlled - no meds  . H/O hiatal hernia   . Hypertension   . Hypothyroidism   . Kidney stones   . Morbid obesity ([place])   . Pneumonia    hoispitalized in 08/2011  . Sarcoidosis   . Seasonal allergies   . Sleep apnea    uses CPAP machine     Past Surgical  History:  Procedure Laterality Date  . [place]   x 1  . CHOLECYSTECTOMY  2000  . DILATION AND CURETTAGE OF UTERUS N/A 08/09/2016   Procedure: DILATATION AND CURETTAGE;  Surgeon: Everitt Amber, MD;  Location: [facility];  Service: Gynecology;  Laterality: N/A;  . HYSTEROSCOPY W/D&C  12/27/2011   Procedure: DILATATION AND CURETTAGE /HYSTEROSCOPY;  Surgeon: Maeola Sarah. Landry Mellow, MD;  Location: [facility];  Service: Gynecology;;  . I and D of abcess  05/2011  . INTRAUTERINE DEVICE (IUD) INSERTION N/A 08/09/2016   Procedure: INTRAUTERINE DEVICE (IUD) INSERTION;  Surgeon: Everitt Amber, MD;  Location: [facility];  Service: Gynecology;  Laterality: N/A;  . LUNG BIOPSY    . uterine abletion      Family History  Problem Relation Age of Onset  . Diabetes Father   . Cancer Father 80       colon cancer   . Diabetes Brother   . Deep vein thrombosis Mother   . Aneurysm Sister        d/o brain aneurysm      Current Outpatient Medications:  .  albuterol (PROAIR HFA) 108 (90 Base) MCG/ACT inhaler, Inhale 1-2 puffs into the lungs every 6 (six) hours as needed for wheezing or shortness of breath., Disp: , Rfl:  .  atorvastatin (LIPITOR) 40 MG tablet, Take 1 tablet (40 mg total) by mouth daily at 6 PM., Disp: 30 tablet, Rfl: 0 .  calcium citrate-vitamin D (CITRACAL+D) 315-200 MG-UNIT tablet, Take 1 tablet by mouth daily., Disp: , Rfl:  .  cetirizine (ZYRTEC) 10 MG tablet, Take 10 mg by mouth at bedtime as needed for allergies., Disp: , Rfl:  .  diphenhydrAMINE (BENADRYL) 25 mg capsule, Take 25 mg by mouth every 6 (six) hours as needed (fever)., Disp: , Rfl:  .  DUREZOL 0.05 % EMUL, Place 1 drop into the left eye 3 (three) times daily. , Disp: , Rfl: 1 .  famotidine (PEPCID) 40 MG tablet, Take 1 tablet (40 mg total) by mouth 2 (two) times daily., Disp: 180 tablet, Rfl: 3 .  folic acid (FOLVITE) 1 MG tablet, Take 1 mg by mouth daily., Disp: , Rfl: 3 .  furosemide (LASIX) 40 MG tablet, Take as directed by  cardiologist, Disp: 180 tablet, Rfl: 3 .  HYDROcodone-acetaminophen (NORCO) 10-325 MG tablet, Take 1 tablet by mouth every 6 (six) hours as needed (pain)., Disp: 30 tablet, Rfl: 0 .  ketorolac (ACULAR) 0.5 % ophthalmic solution, , Disp: , Rfl:  .  losartan (COZAAR) 100 MG tablet, TAKE 1 TABLET (100 MG TOTAL) DAILY BY MOUTH., Disp: 90 tablet, Rfl: 0 .  methotrexate 50 MG/2ML injection, Inject 20 mg into the vein once a week. Dose is 0.8 mL (20mg ). Take on Fridays, Disp: , Rfl:  .  metoprolol succinate (TOPROL-XL) 25 MG 24 hr tablet, Take 1 tablet (25 mg total) by mouth daily., Disp: 90 tablet, Rfl: 0 .  NON FORMULARY, 2 liter of oxygen, Disp: , Rfl:  .  potassium chloride  SA (K-DUR) 20 MEQ tablet, Take as directed by cardiologist, Disp: , Rfl:  .  predniSONE (DELTASONE) 10 MG tablet, Take 1 tablet (10 mg total) by mouth daily., Disp: 10 tablet, Rfl: 0 .  triamcinolone acetonide (KENALOG) 40 MG/ML injection, Inject 40 mg into the muscle every 3 (three) months. For knee pain, Disp: , Rfl:  .  triamcinolone ointment (KENALOG) 0.1 %, Apply 1 application topically 2 (two) times daily. For Sarcoidosis, Disp: , Rfl:  .  vitamin B-12 1000 MCG tablet, Take 1 tablet (1,000 mcg total) by mouth daily., Disp: 30 tablet, Rfl: 0 .  Vitamin D, Ergocalciferol, (DRISDOL) 1.25 MG (50000 UT) CAPS capsule, Take 1 capsule (50,000 Units total) by mouth every 7 (seven) days., Disp: 4 capsule, Rfl: 0  EXAM:  VITALS per patient if applicable:  GENERAL: alert, oriented, appears well and in no acute distress  HEENT: atraumatic, conjunttiva clear, no obvious abnormalities on inspection of external nose and ears  NECK: normal movements of the head and neck  LUNGS: on inspection no signs of respiratory distress, breathing rate appears normal, no obvious gross SOB, gasping or wheezing  CV: no obvious cyanosis  MS: moves all visible extremities without noticeable abnormality  PSYCH/NEURO: pleasant and cooperative, no  obvious depression or anxiety, speech and thought processing grossly intact  ASSESSMENT AND PLAN:  Discussed the following assessment and plan:  1. Acute on chronic diastolic congestive heart failure [facility]) -Reviewed hospital discharge notes, Macias, and imaging.  All questions answered to the best of my ability. -Continue daily weights, heart healthy diet, exercise, Lasix 40 mg twice daily. -Follow-up with cardiology, appointment scheduled for September -Return precautions reviewed - CBC with Differential/Platelet; Future - Basic Metabolic Panel; Future  2. Essential hypertension - Continue home meds - CBC with Differential/Platelet; Future - Basic Metabolic Panel; Future  3. OSA (obstructive sleep apnea) - Continue with CPAP. Follow up with pulmonary   4. Hyperlipidemia, unspecified hyperlipidemia type - atorvastatin (LIPITOR) 40 MG tablet; Take 1 tablet (40 mg total) by mouth daily at 6 PM.  Dispense: 90 tablet; Refill: 1 - Repeat in 6 months   5. Hyperglycemia -Encourage weight loss through heart healthy diet and frequent exercise  6. Morbid obesity ([place]) - Nutritionist referral refused - CBC with Differential/Platelet; Future - Basic Metabolic Panel; Future - Vitamin D, Ergocalciferol, (DRISDOL) 1.25 MG (50000 UT) CAPS capsule; Take 1 capsule (50,000 Units total) by mouth every 7 (seven) days.  Dispense: 12 capsule; Refill: 1  7. Vitamin D deficiency  - Vitamin D, Ergocalciferol, (DRISDOL) 1.25 MG (50000 UT) CAPS capsule; Take 1 capsule (50,000 Units total) by mouth every 7 (seven) days.  Dispense: 12 capsule; Refill: 1 - Retest in 6 months     I discussed the assessment and treatment plan with the patient. The patient was provided an opportunity to ask questions and all were answered. The patient agreed with the plan and demonstrated an understanding of the instructions.   The patient was advised to call back or seek an in-person evaluation if the symptoms worsen or if  the condition fails to improve as anticipated.   [name], NP

## 2019-05-21 NOTE — Progress Notes (Deleted)
   Subjective:    Patient ID: [name], female    DOB: 04-Jan-1966, 53 y.o.   MRN: [id number]  HPI    Review of Systems     Objective:   Physical Exam        Assessment & Plan:

## 2019-05-22 ENCOUNTER — Other Ambulatory Visit: Payer: Self-pay | Admitting: Adult Health

## 2019-05-30 NOTE — Progress Notes (Signed)
[place]   Telephone:(336) [phone] [phone]   Clinic Follow up Note   Patient Care Team: [name], NP as PCP - General (Family Medicine) Jerline Pain, MD as PCP - Cardiology (Cardiology) Wonda Horner, MD (Gastroenterology) Rigoberto Noel, MD as Consulting Physician (Pulmonary Disease) Gavin Pound, MD as Consulting Physician (Rheumatology)  I connected with [name] on 06/03/2019 at 10:15 AM EDT by telephone visit and verified that I am speaking with the correct person using two identifiers.  I discussed the limitations, risks, security and privacy concerns of performing an evaluation and management service by telephone and the availability of in person appointments. I also discussed with the patient that there may be a patient responsible charge related to this service. The patient expressed understanding and agreed to proceed.   Patient's location:  Her home  Provider's location:  My Office   CHIEF COMPLAINT: F/u anemia  CURRENT THERAPY:  iv Feraheme 510mg  as needed  INTERVAL HISTORY:  [name] is here for a follow up anemia. She was last seen by me 6 months ago. She notes she is doing well.  Last month she was in the hospital in 04/2019 due to heart issues. She is now doing better and breathing better. She  Feels stable energy wise. She notes her period is not consistently heavy.    REVIEW OF SYSTEMS:   Constitutional: Denies fevers, chills or abnormal weight loss Eyes: Denies blurriness of vision Ears, nose, mouth, throat, and face: Denies mucositis or sore throat Respiratory: Denies cough, dyspnea or wheezes Cardiovascular: Denies palpitation, chest discomfort or lower extremity swelling Gastrointestinal:  Denies nausea, heartburn or change in bowel habits Skin: Denies abnormal skin rashes Lymphatics: Denies new lymphadenopathy or easy bruising Neurological:Denies numbness, tingling or new  weaknesses Behavioral/Psych: Mood is stable, no new changes  All other systems were reviewed with the patient and are negative.  MEDICAL HISTORY:  Past Medical History:  Diagnosis Date  . Anemia   . Arthritis    hands, shoulders, no meds  . CHF (congestive heart failure) (HCC)    EF60-65%  . Chronic hyperventilation syndrome    w/ obesity tx with albuterol inhaler and oxygen 2L  . COPD (chronic obstructive pulmonary disease) ([place])    uses oxygen 2 L  . Gallstones   . GERD (gastroesophageal reflux disease)    diet controlled - no meds  . H/O hiatal hernia   . Hypertension   . Hypothyroidism   . Kidney stones   . Morbid obesity ([place])   . Pneumonia    hoispitalized in 08/2011  . Sarcoidosis   . Seasonal allergies   . Sleep apnea    uses CPAP machine     SURGICAL HISTORY: Past Surgical History:  Procedure Laterality Date  . [place]   x 1  . CHOLECYSTECTOMY  2000  . DILATION AND CURETTAGE OF UTERUS N/A 08/09/2016   Procedure: DILATATION AND CURETTAGE;  Surgeon: Everitt Amber, MD;  Location: [facility];  Service: Gynecology;  Laterality: N/A;  . HYSTEROSCOPY W/D&C  12/27/2011   Procedure: DILATATION AND CURETTAGE /HYSTEROSCOPY;  Surgeon: Maeola Sarah. Landry Mellow, MD;  Location: [facility];  Service: Gynecology;;  . I and D of abcess  05/2011  . INTRAUTERINE DEVICE (IUD) INSERTION N/A 08/09/2016   Procedure: INTRAUTERINE DEVICE (IUD) INSERTION;  Surgeon: Everitt Amber, MD;  Location: [facility];  Service: Gynecology;  Laterality: N/A;  . LUNG BIOPSY    . uterine abletion  I have reviewed the social history and family history with the patient and they are unchanged from previous note.  ALLERGIES:  has No Known Allergies.  MEDICATIONS:  Current Outpatient Medications  Medication Sig Dispense Refill  . albuterol (PROAIR HFA) 108 (90 Base) MCG/ACT inhaler Inhale 1-2 puffs into the lungs every 6 (six) hours as needed for wheezing or shortness of breath.    Marland Kitchen atorvastatin (LIPITOR) 40  MG tablet Take 1 tablet (40 mg total) by mouth daily at 6 PM. 90 tablet 1  . calcium citrate-vitamin D (CITRACAL+D) 315-200 MG-UNIT tablet Take 1 tablet by mouth daily.    . cetirizine (ZYRTEC) 10 MG tablet Take 10 mg by mouth at bedtime as needed for allergies.    . diphenhydrAMINE (BENADRYL) 25 mg capsule Take 25 mg by mouth every 6 (six) hours as needed (fever).    . DUREZOL 0.05 % EMUL Place 1 drop into the left eye 3 (three) times daily.   1  . famotidine (PEPCID) 40 MG tablet Take 1 tablet (40 mg total) by mouth 2 (two) times daily. 025 tablet 3  . folic acid (FOLVITE) 1 MG tablet Take 1 mg by mouth daily.  3  . furosemide (LASIX) 40 MG tablet Take as directed by cardiologist 180 tablet 3  . HYDROcodone-acetaminophen (NORCO) 10-325 MG tablet Take 1 tablet by mouth every 6 (six) hours as needed (pain). 30 tablet 0  . ketorolac (ACULAR) 0.5 % ophthalmic solution     . losartan (COZAAR) 100 MG tablet TAKE 1 TABLET (100 MG TOTAL) DAILY BY MOUTH. 90 tablet 3  . methotrexate 50 MG/2ML injection Inject 20 mg into the vein once a week. Dose is 0.8 mL (20mg ). Take on Fridays    . metoprolol succinate (TOPROL-XL) 25 MG 24 hr tablet TAKE 1 TABLET BY MOUTH EVERY DAY 90 tablet 3  . NON FORMULARY 2 liter of oxygen    . potassium chloride SA (K-DUR) 20 MEQ tablet Take as directed by cardiologist    . predniSONE (DELTASONE) 10 MG tablet Take 1 tablet (10 mg total) by mouth daily. 10 tablet 0  . triamcinolone acetonide (KENALOG) 40 MG/ML injection Inject 40 mg into the muscle every 3 (three) months. For knee pain    . triamcinolone ointment (KENALOG) 0.1 % Apply 1 application topically 2 (two) times daily. For Sarcoidosis    . vitamin B-12 1000 MCG tablet Take 1 tablet (1,000 mcg total) by mouth daily. 30 tablet 0  . Vitamin D, Ergocalciferol, (DRISDOL) 1.25 MG (50000 UT) CAPS capsule Take 1 capsule (50,000 Units total) by mouth every 7 (seven) days. 12 capsule 1   No current facility-administered  medications for this visit.     PHYSICAL EXAMINATION: ECOG PERFORMANCE STATUS: 1 - Symptomatic but completely ambulatory  No vitals taken today, Exam not performed today  LABORATORY DATA:  I have reviewed the data as listed CBC Latest Ref Rng & Units 05/09/2019 05/08/2019 05/08/2019  WBC 4.0 - 10.5 K/uL 9.5 8.2 8.5  Hemoglobin 12.0 - 15.0 g/dL 11.0(L) 12.7 12.8  Hematocrit 36.0 - 46.0 % 37.3 42.3 41.4  Platelets 150 - 400 K/uL 188 227 251.0     CMP Latest Ref Rng & Units 05/12/2019 05/11/2019 05/10/2019  Glucose 70 - 99 mg/dL 106(H) 123(H) 128(H)  BUN 6 - 20 mg/dL 25(H) 27(H) 31(H)  Creatinine 0.44 - 1.00 mg/dL 0.72 0.86 1.12(H)  Sodium 135 - 145 mmol/L 139 140 141  Potassium 3.5 - 5.1 mmol/L 3.5 3.4(L) 3.7  Chloride 98 - 111 mmol/L 96(L) 96(L) 99  CO2 22 - 32 mmol/L 33(H) 33(H) 33(H)  Calcium 8.9 - 10.3 mg/dL 9.0 9.3 8.8(L)  Total Protein 6.5 - 8.1 g/dL - - -  Total Bilirubin 0.3 - 1.2 mg/dL - - -  Alkaline Phos 38 - 126 U/L - - -  AST 15 - 41 U/L - - -  ALT 0 - 44 U/L - - -      RADIOGRAPHIC STUDIES: I have personally reviewed the radiological images as listed and agreed with the findings in the report. No results found.   ASSESSMENT & PLAN:  [name] is a 53 y.o. female with   1. Iron deficient anemia from Menorrhagia -Her multiple iron studies previouslyshowed very low level of ferritin, serum iron level and transferrin saturation, her MCV is low, consistent with iron deficient anemia. -Her iron deficiency is likely from her menorrhagia -She is not very compliant with oral iron supplement, responded well to IV iron -she has tried IUD, but did not help much and it was removed, her menorrhagia has improved some since then   -She has required IV iron every 2-4 monthsin the past few years. She toleratedthe infusionswell. Oral Iron makes her nauseated so she plans to only proceed with IV iron.  -Last iron panel showed iron 39 in 04/2019. She received IV  Fereheme on 04/22/19. Iron level has likely improved since then. Latest CBC from 05/09/19 shows Hg 11 when she was in hospital.  -Will continue to monitor with Lab every 8 weeks, iv iron if ferritin<50 or low iron level. -f/u in 4- 6 months    2. Hypertension, pulmonary sarcomatosis, congestive heart failure morbid obesity -F/u with PCP - Admitted to the ED 04/16/18-04/20/18 for SOB, 10/26/2018 for SOB 10/19/2018 -Uses CPAP - On Lasix   3. Morbid obesity  -we discuss weight loss and I recommend her to see weight management clinic, she declined previously.    Plan  - Labs every 2 months, will set up IV iron if ferritin less than 50 or low iron level - F/u with me in 6 months    No problem-specific Assessment & Plan notes found for this encounter.   No orders of the defined types were placed in this encounter.  I discussed the assessment and treatment plan with the patient. The patient was provided an opportunity to ask questions and all were answered. The patient agreed with the plan and demonstrated an understanding of the instructions.  The patient was advised to call back or seek an in-person evaluation if the symptoms worsen or if the condition fails to improve as anticipated.  I provided 10 minutes of non face-to-face telephone visit time during this encounter, and > 50% was spent counseling as documented under my assessment & plan.     Truitt Merle, MD 06/03/2019   I, Joslyn Devon, am acting as scribe for Truitt Merle, MD.   I have reviewed the above documentation for accuracy and completeness, and I agree with the above.

## 2019-06-03 ENCOUNTER — Inpatient Hospital Stay: Payer: Medicare HMO

## 2019-06-03 ENCOUNTER — Inpatient Hospital Stay: Payer: Medicare HMO | Attending: Hematology | Admitting: Hematology

## 2019-06-03 ENCOUNTER — Encounter: Payer: Self-pay | Admitting: Hematology

## 2019-06-03 DIAGNOSIS — E662 Morbid (severe) obesity with alveolar hypoventilation: Secondary | ICD-10-CM

## 2019-06-03 DIAGNOSIS — D500 Iron deficiency anemia secondary to blood loss (chronic): Secondary | ICD-10-CM

## 2019-06-03 DIAGNOSIS — G4733 Obstructive sleep apnea (adult) (pediatric): Secondary | ICD-10-CM | POA: Diagnosis not present

## 2019-06-03 DIAGNOSIS — I10 Essential (primary) hypertension: Secondary | ICD-10-CM | POA: Diagnosis not present

## 2019-06-04 ENCOUNTER — Telehealth: Payer: Self-pay | Admitting: Hematology

## 2019-06-04 NOTE — Telephone Encounter (Signed)
Scheduled appt per 8/3 los.  Printed and mailed appt calendar.

## 2019-06-10 DIAGNOSIS — G4733 Obstructive sleep apnea (adult) (pediatric): Secondary | ICD-10-CM | POA: Diagnosis not present

## 2019-06-11 DIAGNOSIS — E669 Obesity, unspecified: Secondary | ICD-10-CM | POA: Diagnosis not present

## 2019-06-11 DIAGNOSIS — G4733 Obstructive sleep apnea (adult) (pediatric): Secondary | ICD-10-CM | POA: Diagnosis not present

## 2019-06-25 DIAGNOSIS — Z79899 Other long term (current) drug therapy: Secondary | ICD-10-CM | POA: Diagnosis not present

## 2019-06-25 DIAGNOSIS — D860 Sarcoidosis of lung: Secondary | ICD-10-CM | POA: Diagnosis not present

## 2019-06-25 DIAGNOSIS — M064 Inflammatory polyarthropathy: Secondary | ICD-10-CM | POA: Diagnosis not present

## 2019-06-25 DIAGNOSIS — M159 Polyosteoarthritis, unspecified: Secondary | ICD-10-CM | POA: Diagnosis not present

## 2019-06-25 DIAGNOSIS — E662 Morbid (severe) obesity with alveolar hypoventilation: Secondary | ICD-10-CM | POA: Diagnosis not present

## 2019-06-25 DIAGNOSIS — R0602 Shortness of breath: Secondary | ICD-10-CM | POA: Diagnosis not present

## 2019-07-11 DIAGNOSIS — G4733 Obstructive sleep apnea (adult) (pediatric): Secondary | ICD-10-CM | POA: Diagnosis not present

## 2019-07-12 DIAGNOSIS — E669 Obesity, unspecified: Secondary | ICD-10-CM | POA: Diagnosis not present

## 2019-07-12 DIAGNOSIS — G4733 Obstructive sleep apnea (adult) (pediatric): Secondary | ICD-10-CM | POA: Diagnosis not present

## 2019-07-18 ENCOUNTER — Telehealth: Payer: Self-pay | Admitting: Adult Health

## 2019-07-25 ENCOUNTER — Other Ambulatory Visit: Payer: Self-pay

## 2019-07-25 ENCOUNTER — Telehealth (INDEPENDENT_AMBULATORY_CARE_PROVIDER_SITE_OTHER): Payer: Medicare HMO | Admitting: Adult Health

## 2019-07-25 DIAGNOSIS — G479 Sleep disorder, unspecified: Secondary | ICD-10-CM | POA: Diagnosis not present

## 2019-07-25 DIAGNOSIS — F419 Anxiety disorder, unspecified: Secondary | ICD-10-CM | POA: Diagnosis not present

## 2019-07-25 DIAGNOSIS — R69 Illness, unspecified: Secondary | ICD-10-CM | POA: Diagnosis not present

## 2019-07-25 NOTE — Progress Notes (Signed)
Virtual Visit via Video Note  I connected with [name]  on 07/25/19 at  7:30 AM EDT by a video enabled telemedicine application and verified that I am speaking with the correct person using two identifiers.  Location patient: home Location provider:work or home office Persons participating in the virtual visit: patient, provider  I discussed the limitations of evaluation and management by telemedicine and the availability of in person appointments. The patient expressed understanding and agreed to proceed.   HPI: 53 year old female who is being evaluated today for anxiety and panic attacks.  She reports that her symptoms started a few weeks ago after she had to commit with a family member for "psychotic break".  Family member spent about a week and a half at behavioral health, but has since been released.  Since a family member has been released she is calling and texting [name] multiple times throughout the day for emotional support.  [place] reports that she is unable to fall asleep until about 4 AM because she cannot turn off her mind and in the middle of the day she will start feeling anxious and having heart palpitations.  Her symptoms are not happening every day.  She does not want to go on medication   ROS: See pertinent positives and negatives per HPI.  Past Medical History:  Diagnosis Date  . Anemia   . Arthritis    hands, shoulders, no meds  . CHF (congestive heart failure) (HCC)    EF60-65%  . Chronic hyperventilation syndrome    w/ obesity tx with albuterol inhaler and oxygen 2L  . COPD (chronic obstructive pulmonary disease) ([place])    uses oxygen 2 L  . Gallstones   . GERD (gastroesophageal reflux disease)    diet controlled - no meds  . H/O hiatal hernia   . Hypertension   . Hypothyroidism   . Kidney stones   . Morbid obesity ([place])   . Pneumonia    hoispitalized in 08/2011  . Sarcoidosis   . Seasonal allergies   . Sleep apnea    uses CPAP  machine     Past Surgical History:  Procedure Laterality Date  . [place]   x 1  . CHOLECYSTECTOMY  2000  . DILATION AND CURETTAGE OF UTERUS N/A 08/09/2016   Procedure: DILATATION AND CURETTAGE;  Surgeon: Everitt Amber, MD;  Location: [facility];  Service: Gynecology;  Laterality: N/A;  . HYSTEROSCOPY W/D&C  12/27/2011   Procedure: DILATATION AND CURETTAGE /HYSTEROSCOPY;  Surgeon: Maeola Sarah. Landry Mellow, MD;  Location: [facility] [facility];  Service: Gynecology;;  . I and D of abcess  05/2011  . INTRAUTERINE DEVICE (IUD) INSERTION N/A 08/09/2016   Procedure: INTRAUTERINE DEVICE (IUD) INSERTION;  Surgeon: Everitt Amber, MD;  Location: [facility];  Service: Gynecology;  Laterality: N/A;  . LUNG BIOPSY    . uterine abletion      Family History  Problem Relation Age of Onset  . Diabetes Father   . Cancer Father 57       colon cancer   . Diabetes Brother   . Deep vein thrombosis Mother   . Aneurysm Sister        d/o brain aneurysm     Current Outpatient Medications:  .  albuterol (PROAIR HFA) 108 (90 Base) MCG/ACT inhaler, Inhale 1-2 puffs into the lungs every 6 (six) hours as needed for wheezing or shortness of breath., Disp: , Rfl:  .  atorvastatin (LIPITOR) 40 MG tablet, Take 1 tablet (40  mg total) by mouth daily at 6 PM., Disp: 90 tablet, Rfl: 1 .  calcium citrate-vitamin D (CITRACAL+D) 315-200 MG-UNIT tablet, Take 1 tablet by mouth daily., Disp: , Rfl:  .  cetirizine (ZYRTEC) 10 MG tablet, Take 10 mg by mouth at bedtime as needed for allergies., Disp: , Rfl:  .  diphenhydrAMINE (BENADRYL) 25 mg capsule, Take 25 mg by mouth every 6 (six) hours as needed (fever)., Disp: , Rfl:  .  DUREZOL 0.05 % EMUL, Place 1 drop into the left eye 3 (three) times daily. , Disp: , Rfl: 1 .  famotidine (PEPCID) 40 MG tablet, Take 1 tablet (40 mg total) by mouth 2 (two) times daily., Disp: 180 tablet, Rfl: 3 .  folic acid (FOLVITE) 1 MG tablet, Take 1 mg by mouth daily., Disp: , Rfl: 3 .  furosemide (LASIX) 40 MG tablet,  Take as directed by cardiologist, Disp: 180 tablet, Rfl: 3 .  HYDROcodone-acetaminophen (NORCO) 10-325 MG tablet, Take 1 tablet by mouth every 6 (six) hours as needed (pain)., Disp: 30 tablet, Rfl: 0 .  ketorolac (ACULAR) 0.5 % ophthalmic solution, , Disp: , Rfl:  .  losartan (COZAAR) 100 MG tablet, TAKE 1 TABLET (100 MG TOTAL) DAILY BY MOUTH., Disp: 90 tablet, Rfl: 3 .  methotrexate 50 MG/2ML injection, Inject 20 mg into the vein once a week. Dose is 0.8 mL (20mg ). Take on Fridays, Disp: , Rfl:  .  metoprolol succinate (TOPROL-XL) 25 MG 24 hr tablet, TAKE 1 TABLET BY MOUTH EVERY DAY, Disp: 90 tablet, Rfl: 3 .  NON FORMULARY, 2 liter of oxygen, Disp: , Rfl:  .  potassium chloride SA (K-DUR) 20 MEQ tablet, Take as directed by cardiologist, Disp: , Rfl:  .  predniSONE (DELTASONE) 10 MG tablet, Take 1 tablet (10 mg total) by mouth daily., Disp: 10 tablet, Rfl: 0 .  triamcinolone acetonide (KENALOG) 40 MG/ML injection, Inject 40 mg into the muscle every 3 (three) months. For knee pain, Disp: , Rfl:  .  triamcinolone ointment (KENALOG) 0.1 %, Apply 1 application topically 2 (two) times daily. For Sarcoidosis, Disp: , Rfl:  .  vitamin B-12 1000 MCG tablet, Take 1 tablet (1,000 mcg total) by mouth daily., Disp: 30 tablet, Rfl: 0 .  Vitamin D, Ergocalciferol, (DRISDOL) 1.25 MG (50000 UT) CAPS capsule, Take 1 capsule (50,000 Units total) by mouth every 7 (seven) days., Disp: 12 capsule, Rfl: 1  EXAM:  VITALS per patient if applicable:  GENERAL: alert, oriented, appears well and in no acute distress  HEENT: atraumatic, conjunttiva clear, no obvious abnormalities on inspection of external nose and ears  NECK: normal movements of the head and neck  LUNGS: on inspection no signs of respiratory distress, breathing rate appears normal, no obvious gross SOB, gasping or wheezing  CV: no obvious cyanosis  MS: moves all visible extremities without noticeable abnormality  PSYCH/NEURO: pleasant and  cooperative, no obvious depression or anxiety, speech and thought processing grossly intact  ASSESSMENT AND PLAN:  Discussed the following assessment and plan:  1. Anxiety -Respect the fact that she does not want to go on medication at this time.  Advise deep breathing exercises, meditation, music therapy, and exercise.  She was also advised that she may have to start setting boundaries with her family member as the emotional stress is starting to affect her health as well.  2. Sleep disturbance -Advised meditation or she can use melatonin for the next week to get back on a regular sleep cycle. -Follow-up as  needed   I discussed the assessment and treatment plan with the patient. The patient was provided an opportunity to ask questions and all were answered. The patient agreed with the plan and demonstrated an understanding of the instructions.   The patient was advised to call back or seek an in-person evaluation if the symptoms worsen or if the condition fails to improve as anticipated.   [name], NP

## 2019-07-31 ENCOUNTER — Other Ambulatory Visit: Payer: Self-pay

## 2019-07-31 ENCOUNTER — Telehealth (INDEPENDENT_AMBULATORY_CARE_PROVIDER_SITE_OTHER): Payer: Medicare HMO | Admitting: Cardiology

## 2019-07-31 ENCOUNTER — Encounter: Payer: Self-pay | Admitting: Cardiology

## 2019-07-31 VITALS — BP 162/69 | HR 84 | Ht 65.0 in | Wt >= 6400 oz

## 2019-07-31 DIAGNOSIS — I5032 Chronic diastolic (congestive) heart failure: Secondary | ICD-10-CM

## 2019-07-31 DIAGNOSIS — J9611 Chronic respiratory failure with hypoxia: Secondary | ICD-10-CM | POA: Diagnosis not present

## 2019-07-31 DIAGNOSIS — G4733 Obstructive sleep apnea (adult) (pediatric): Secondary | ICD-10-CM | POA: Diagnosis not present

## 2019-07-31 DIAGNOSIS — Z9989 Dependence on other enabling machines and devices: Secondary | ICD-10-CM | POA: Diagnosis not present

## 2019-07-31 NOTE — Patient Instructions (Signed)
Medication Instructions:  Faythe Ghee to take occasional extra dose of Lasix 40 mg as needed  If you need a refill on your cardiac medications before your next appointment, please call your pharmacy.   Lab work: none If you have labs (blood work) drawn today and your tests are completely normal, you will receive your results only by: Marland Kitchen MyChart Message (if you have MyChart) OR . A paper copy in the mail If you have any lab test that is abnormal or we need to change your treatment, we will call you to review the results.  Testing/Procedures: none  Follow-Up: At [facility], you and your health needs are our priority.  As part of our continuing mission to provide you with exceptional heart care, we have created designated Provider Care Teams.  These Care Teams include your primary Cardiologist (physician) and Advanced Practice Providers (APPs -  Physician Assistants and Nurse Practitioners) who all work together to provide you with the care you need, when you need it. You will need a follow up appointment in 3 months.  Please call our office 2 months in advance to schedule this appointment.  You may see one of the following Advanced Practice Providers on your designated Care Team:    Cecilie Kicks, NP .   Any Other Special Instructions Will Be Listed Below (If Applicable). none

## 2019-07-31 NOTE — Progress Notes (Signed)
Cardiology Office Note:    Date:  07/31/2019   ID:  [name], DOB 1966-08-25, MRN [id number]  PCP:  [name], NP  Cardiologist:  Kim Furbish, MD  Electrophysiologist:  None   Referring MD: [name], NP   Telemedicine, video visit, 19 minutes secondary to COVID-19 pandemic restrictions.  History of Present Illness:    [name] is a 53 y.o. female here for the follow-up of diastolic heart failure, morbid obesity, hypothyroidism, hypertension, sarcoidosis with  March 2019 seen in consultation.  She was last seen by me in 2016 and felt dyspneic secondary to morbid obesity.  Normal EF, normal BMP.  Right heart cath was discussed but pulmonary pressures are likely elevated to body habitus.  Felt like she was released way to soon. Diarrhea, Fever.   Echocardiogram 01/11/18 Study Conclusions - Left ventricle: The cavity size was normal. Wall thickness was increased in a pattern of mild LVH. Systolic function was normal. The estimated ejection fraction was in the range of 55% to 60%. Wall motion was normal; there were no regional wall motion abnormalities. - Aortic valve: Trileaflet; mildly calcified leaflets. - Right ventricle: The cavity size was mildly dilated. - Pulmonary arteries: Systolic pressure was mildly increased. PA peak pressure: 35 mm Hg (S). - Pericardium, extracardiac: A trivial pericardial effusion was identified.  BMP at that time once again was low underrepresented because of morbid obesity component troponins were normal. EF was normal. Fluid restriction salt restriction.  Lasix.  [place] Provencal, NP on 11/07/2018.  In their note, seems like she was refusing to take 80 mg of Lasix twice a day and she was continued on 40 mg twice a day.  07/31/2019  - here for post hospital follow up heart failure exacerbation.  Her Lasix 120 mg p.o. twice daily was too much for her sometimes she did not urinate with this.  I wonder  if we made her to intravascularly dry.  She did however have an aggressive IV diuresis with Lasix in the hospital and she is maintaining her weight just below 500 pounds currently.  No fevers chills nausea vomiting syncope bleeding.  Her shortness of breath is at baseline.  She does feel like she is holding onto fluid again.  I asked her to take an additional 40 mg of Lasix if need be.  Past Medical History:  Diagnosis Date  . Anemia   . Arthritis    hands, shoulders, no meds  . CHF (congestive heart failure) (HCC)    EF60-65%  . Chronic hyperventilation syndrome    w/ obesity tx with albuterol inhaler and oxygen 2L  . COPD (chronic obstructive pulmonary disease) ([place])    uses oxygen 2 L  . Gallstones   . GERD (gastroesophageal reflux disease)    diet controlled - no meds  . H/O hiatal hernia   . Hypertension   . Hypothyroidism   . Kidney stones   . Morbid obesity ([place])   . Pneumonia    hoispitalized in 08/2011  . Sarcoidosis   . Seasonal allergies   . Sleep apnea    uses CPAP machine     Past Surgical History:  Procedure Laterality Date  . [place]   x 1  . CHOLECYSTECTOMY  2000  . DILATION AND CURETTAGE OF UTERUS N/A 08/09/2016   Procedure: DILATATION AND CURETTAGE;  Surgeon: Everitt Amber, MD;  Location: [facility];  Service: Gynecology;  Laterality: N/A;  . HYSTEROSCOPY W/D&C  12/27/2011  Procedure: DILATATION AND CURETTAGE /HYSTEROSCOPY;  Surgeon: Maeola Sarah. Landry Mellow, MD;  Location: [place] [facility];  Service: Gynecology;;  . I and D of abcess  05/2011  . INTRAUTERINE DEVICE (IUD) INSERTION N/A 08/09/2016   Procedure: INTRAUTERINE DEVICE (IUD) INSERTION;  Surgeon: Everitt Amber, MD;  Location: [facility];  Service: Gynecology;  Laterality: N/A;  . LUNG BIOPSY    . uterine abletion      Current Medications: Current Meds  Medication Sig  . albuterol (PROAIR HFA) 108 (90 Base) MCG/ACT inhaler Inhale 1-2 puffs into the lungs every 6 (six) hours as needed for wheezing or shortness of  breath.  Marland Kitchen atorvastatin (LIPITOR) 40 MG tablet Take 1 tablet (40 mg total) by mouth daily at 6 PM.  . calcium citrate-vitamin D (CITRACAL+D) 315-200 MG-UNIT tablet Take 1 tablet by mouth daily.  . cetirizine (ZYRTEC) 10 MG tablet Take 10 mg by mouth at bedtime as needed for allergies.  . diphenhydrAMINE (BENADRYL) 25 mg capsule Take 25 mg by mouth every 6 (six) hours as needed (fever).  . DUREZOL 0.05 % EMUL Place 1 drop into the left eye 3 (three) times daily.   . famotidine (PEPCID) 40 MG tablet Take 1 tablet (40 mg total) by mouth 2 (two) times daily.  . folic acid (FOLVITE) 1 MG tablet Take 1 mg by mouth daily.  . furosemide (LASIX) 40 MG tablet Take as directed by cardiologist (Patient taking differently: Take 40 mg by mouth daily. Take as directed by cardiologist)  . HYDROcodone-acetaminophen (NORCO) 10-325 MG tablet Take 1 tablet by mouth every 6 (six) hours as needed (pain).  Marland Kitchen ketorolac (ACULAR) 0.5 % ophthalmic solution   . losartan (COZAAR) 100 MG tablet TAKE 1 TABLET (100 MG TOTAL) DAILY BY MOUTH.  . methotrexate 50 MG/2ML injection Inject 20 mg into the vein once a week. Dose is 0.8 mL (20mg ). Take on Fridays  . metoprolol succinate (TOPROL-XL) 25 MG 24 hr tablet TAKE 1 TABLET BY MOUTH EVERY DAY  . NON FORMULARY 2 liter of oxygen  . potassium chloride SA (K-DUR) 20 MEQ tablet Take as directed by cardiologist  . predniSONE (DELTASONE) 10 MG tablet Take 1 tablet (10 mg total) by mouth daily.  Marland Kitchen triamcinolone acetonide (KENALOG) 40 MG/ML injection Inject 40 mg into the muscle every 3 (three) months. For knee pain  . triamcinolone ointment (KENALOG) 0.1 % Apply 1 application topically 2 (two) times daily. For Sarcoidosis  . vitamin B-12 1000 MCG tablet Take 1 tablet (1,000 mcg total) by mouth daily.  . Vitamin D, Ergocalciferol, (DRISDOL) 1.25 MG (50000 UT) CAPS capsule Take 1 capsule (50,000 Units total) by mouth every 7 (seven) days.     Allergies:   Patient has no known allergies.    Social History   Socioeconomic History  . Marital status: Single    Spouse name: Not on file  . Number of children: Not on file  . Years of education: Not on file  . Highest education level: Not on file  Occupational History  . Occupation: unemployeed  Social Needs  . Financial resource strain: Not on file  . Food insecurity    Worry: Not on file    Inability: Not on file  . Transportation needs    Medical: Not on file    Non-medical: Not on file  Tobacco Use  . Smoking status: Never Smoker  . Smokeless tobacco: Never Used  Substance and Sexual Activity  . Alcohol use: Yes    Comment: occasionally  .  Drug use: No  . Sexual activity: Never    Birth control/protection: None  Lifestyle  . Physical activity    Days per week: Not on file    Minutes per session: Not on file  . Stress: Not on file  Relationships  . Social Herbalist on phone: Not on file    Gets together: Not on file    Attends religious service: Not on file    Active member of club or organization: Not on file    Attends meetings of clubs or organizations: Not on file    Relationship status: Not on file  Other Topics Concern  . Not on file  Social History Narrative   Son lives with patient.   Divorced for eight or nine years   Works from home for [employer].      Family History: The patient's family history includes Aneurysm in her sister; Cancer (age of onset: 63) in her father; Deep vein thrombosis in her mother; Diabetes in her brother and father.  ROS:   Please see the history of present illness.     All other systems reviewed and are negative.  EKGs/Labs/Other Studies Reviewed:    The following studies were reviewed today: Prior hospital records lab work EKG  EKG:  EKG is not ordered today.    Recent Labs: 04/03/2019: TSH 2.340 05/08/2019: ALT 22; B Natriuretic Peptide 54.2 05/09/2019: Hemoglobin 11.0; Platelets 188 05/12/2019: BUN 25; Creatinine, Ser 0.72; Magnesium 1.9;  Potassium 3.5; Sodium 139  Recent Lipid Panel    Component Value Date/Time   CHOL 241 (H) 05/08/2019 0811   TRIG 125.0 05/08/2019 0811   HDL 58.80 05/08/2019 0811   CHOLHDL 4 05/08/2019 0811   VLDL 25.0 05/08/2019 0811   LDLCALC 157 (H) 05/08/2019 0811    Physical Exam:    VS:  BP (!) 162/69   Pulse 84   Ht 5\' 5"  (1.651 m)   Wt (!) 498 lb (225.9 kg)   SpO2 98%   BMI 82.87 kg/m     Wt Readings from Last 3 Encounters:  07/31/19 (!) 498 lb (225.9 kg)  05/12/19 (!) 498 lb 6.4 oz (226.1 kg)  05/08/19 (!) 514 lb (233.1 kg)     Morbidly obese, alert, pleasant able to complete full sentence without difficulty wearing supplemental oxygen.  ASSESSMENT:    1. Chronic diastolic heart failure ([place])   2. Chronic respiratory failure with hypoxia (HCC)   3. OSA on CPAP    PLAN:    In order of problems listed above:  Chronic diastolic heart failure - Recent admission December 2019 with chronic respiratory failure home oxygen COPD obesity hypoventilation syndrome.  Had a 19 pound weight gain increasing leg edema.  IV diuretics was administered.  She was diuresed 11 L.  Lost about 20 pounds at admission.  She also was admitted in July 2020.  Good overall diuresis with IV Lasix again. -Tried Lasix to 120 mg p.o. twice daily but this became ineffective see below.  Continue with fluid and salt restrictions.  Low-carb diet.  - Lasix 40mg  PO BID. Seems to be working ok. When on 120 could not urinate at all (probably too dry).  She can take an occasional extra Lasix if need be.  Morbid obesity - Continues to be the crux of many of her health issues.  BMI 83. -Continue to encourage weight loss.  She is at high risk.  Discussed again.  Sarcoidosis -Methotrexate prednisone.  Per primary  team.  Checking lab work soon.  Medication Adjustments/Labs and Tests Ordered: Current medicines are reviewed at length with the patient today.  Concerns regarding medicines are outlined above.  No orders of  the defined types were placed in this encounter.  No orders of the defined types were placed in this encounter.   Patient Instructions  Medication Instructions:  Okay to take occasional extra dose of Lasix 40 mg as needed  If you need a refill on your cardiac medications before your next appointment, please call your pharmacy.   Lab work: none If you have labs (blood work) drawn today and your tests are completely normal, you will receive your results only by: Marland Kitchen MyChart Message (if you have MyChart) OR . A paper copy in the mail If you have any lab test that is abnormal or we need to change your treatment, we will call you to review the results.  Testing/Procedures: none  Follow-Up: At [facility], you and your health needs are our priority.  As part of our continuing mission to provide you with exceptional heart care, we have created designated Provider Care Teams.  These Care Teams include your primary Cardiologist (physician) and Advanced Practice Providers (APPs -  Physician Assistants and Nurse Practitioners) who all work together to provide you with the care you need, when you need it. You will need a follow up appointment in 3 months.  Please call our office 2 months in advance to schedule this appointment.  You may see one of the following Advanced Practice Providers on your designated Care Team:    Cecilie Kicks, NP .   Any Other Special Instructions Will Be Listed Below (If Applicable). none      Signed, Kim Furbish, MD  07/31/2019 10:33 AM    [facility]

## 2019-08-05 ENCOUNTER — Inpatient Hospital Stay: Payer: Medicare HMO | Attending: Hematology

## 2019-08-10 DIAGNOSIS — E661 Drug-induced obesity: Secondary | ICD-10-CM | POA: Diagnosis not present

## 2019-08-10 DIAGNOSIS — D869 Sarcoidosis, unspecified: Secondary | ICD-10-CM | POA: Diagnosis not present

## 2019-08-10 DIAGNOSIS — R062 Wheezing: Secondary | ICD-10-CM | POA: Diagnosis not present

## 2019-08-10 DIAGNOSIS — G4733 Obstructive sleep apnea (adult) (pediatric): Secondary | ICD-10-CM | POA: Diagnosis not present

## 2019-08-10 DIAGNOSIS — R918 Other nonspecific abnormal finding of lung field: Secondary | ICD-10-CM | POA: Diagnosis not present

## 2019-08-11 DIAGNOSIS — G4733 Obstructive sleep apnea (adult) (pediatric): Secondary | ICD-10-CM | POA: Diagnosis not present

## 2019-08-11 DIAGNOSIS — E669 Obesity, unspecified: Secondary | ICD-10-CM | POA: Diagnosis not present

## 2019-08-16 ENCOUNTER — Emergency Department (HOSPITAL_COMMUNITY): Payer: Medicare HMO

## 2019-08-16 ENCOUNTER — Inpatient Hospital Stay (HOSPITAL_COMMUNITY)
Admission: EM | Admit: 2019-08-16 | Discharge: 2019-08-22 | DRG: 292 | Disposition: A | Discharge status: Home Health | Payer: Medicare HMO | Attending: Internal Medicine | Admitting: Internal Medicine

## 2019-08-16 ENCOUNTER — Other Ambulatory Visit: Payer: Self-pay

## 2019-08-16 ENCOUNTER — Encounter (HOSPITAL_COMMUNITY): Payer: Self-pay | Admitting: Emergency Medicine

## 2019-08-16 DIAGNOSIS — Z91128 Patient's intentional underdosing of medication regimen for other reason: Secondary | ICD-10-CM

## 2019-08-16 DIAGNOSIS — G4733 Obstructive sleep apnea (adult) (pediatric): Secondary | ICD-10-CM

## 2019-08-16 DIAGNOSIS — E039 Hypothyroidism, unspecified: Secondary | ICD-10-CM | POA: Diagnosis present

## 2019-08-16 DIAGNOSIS — Z79899 Other long term (current) drug therapy: Secondary | ICD-10-CM

## 2019-08-16 DIAGNOSIS — E038 Other specified hypothyroidism: Secondary | ICD-10-CM

## 2019-08-16 DIAGNOSIS — Z20828 Contact with and (suspected) exposure to other viral communicable diseases: Secondary | ICD-10-CM | POA: Diagnosis not present

## 2019-08-16 DIAGNOSIS — N179 Acute kidney failure, unspecified: Secondary | ICD-10-CM | POA: Diagnosis present

## 2019-08-16 DIAGNOSIS — I509 Heart failure, unspecified: Secondary | ICD-10-CM

## 2019-08-16 DIAGNOSIS — Z9049 Acquired absence of other specified parts of digestive tract: Secondary | ICD-10-CM

## 2019-08-16 DIAGNOSIS — Z6841 Body Mass Index (BMI) 40.0 and over, adult: Secondary | ICD-10-CM

## 2019-08-16 DIAGNOSIS — J9611 Chronic respiratory failure with hypoxia: Secondary | ICD-10-CM | POA: Diagnosis present

## 2019-08-16 DIAGNOSIS — D500 Iron deficiency anemia secondary to blood loss (chronic): Secondary | ICD-10-CM | POA: Diagnosis present

## 2019-08-16 DIAGNOSIS — F419 Anxiety disorder, unspecified: Secondary | ICD-10-CM | POA: Diagnosis present

## 2019-08-16 DIAGNOSIS — I110 Hypertensive heart disease with heart failure: Secondary | ICD-10-CM | POA: Diagnosis not present

## 2019-08-16 DIAGNOSIS — I10 Essential (primary) hypertension: Secondary | ICD-10-CM

## 2019-08-16 DIAGNOSIS — J302 Other seasonal allergic rhinitis: Secondary | ICD-10-CM | POA: Diagnosis present

## 2019-08-16 DIAGNOSIS — I5033 Acute on chronic diastolic (congestive) heart failure: Secondary | ICD-10-CM | POA: Diagnosis not present

## 2019-08-16 DIAGNOSIS — E662 Morbid (severe) obesity with alveolar hypoventilation: Secondary | ICD-10-CM | POA: Diagnosis present

## 2019-08-16 DIAGNOSIS — D869 Sarcoidosis, unspecified: Secondary | ICD-10-CM | POA: Diagnosis present

## 2019-08-16 DIAGNOSIS — T501X6A Underdosing of loop [high-ceiling] diuretics, initial encounter: Secondary | ICD-10-CM | POA: Diagnosis present

## 2019-08-16 DIAGNOSIS — E785 Hyperlipidemia, unspecified: Secondary | ICD-10-CM | POA: Diagnosis not present

## 2019-08-16 DIAGNOSIS — K219 Gastro-esophageal reflux disease without esophagitis: Secondary | ICD-10-CM | POA: Diagnosis not present

## 2019-08-16 DIAGNOSIS — Z23 Encounter for immunization: Secondary | ICD-10-CM | POA: Diagnosis not present

## 2019-08-16 DIAGNOSIS — R0789 Other chest pain: Secondary | ICD-10-CM | POA: Diagnosis present

## 2019-08-16 DIAGNOSIS — R0602 Shortness of breath: Secondary | ICD-10-CM | POA: Diagnosis present

## 2019-08-16 DIAGNOSIS — T502X5A Adverse effect of carbonic-anhydrase inhibitors, benzothiadiazides and other diuretics, initial encounter: Secondary | ICD-10-CM | POA: Diagnosis not present

## 2019-08-16 DIAGNOSIS — R069 Unspecified abnormalities of breathing: Secondary | ICD-10-CM | POA: Diagnosis not present

## 2019-08-16 DIAGNOSIS — I272 Pulmonary hypertension, unspecified: Secondary | ICD-10-CM | POA: Diagnosis present

## 2019-08-16 DIAGNOSIS — I5030 Unspecified diastolic (congestive) heart failure: Secondary | ICD-10-CM | POA: Diagnosis present

## 2019-08-16 DIAGNOSIS — M170 Bilateral primary osteoarthritis of knee: Secondary | ICD-10-CM | POA: Diagnosis present

## 2019-08-16 DIAGNOSIS — K449 Diaphragmatic hernia without obstruction or gangrene: Secondary | ICD-10-CM | POA: Diagnosis present

## 2019-08-16 DIAGNOSIS — Z9981 Dependence on supplemental oxygen: Secondary | ICD-10-CM

## 2019-08-16 DIAGNOSIS — Z7952 Long term (current) use of systemic steroids: Secondary | ICD-10-CM

## 2019-08-16 DIAGNOSIS — J449 Chronic obstructive pulmonary disease, unspecified: Secondary | ICD-10-CM | POA: Diagnosis present

## 2019-08-16 LAB — CBC WITH DIFFERENTIAL/PLATELET
Abs Immature Granulocytes: 0.05 10*3/uL (ref 0.00–0.07)
Basophils Absolute: 0 10*3/uL (ref 0.0–0.1)
Basophils Relative: 0 %
Eosinophils Absolute: 0.3 10*3/uL (ref 0.0–0.5)
Eosinophils Relative: 4 %
HCT: 37.7 % (ref 36.0–46.0)
Hemoglobin: 10.8 g/dL — ABNORMAL LOW (ref 12.0–15.0)
Immature Granulocytes: 1 %
Lymphocytes Relative: 14 %
Lymphs Abs: 1.1 10*3/uL (ref 0.7–4.0)
MCH: 24.2 pg — ABNORMAL LOW (ref 26.0–34.0)
MCHC: 28.6 g/dL — ABNORMAL LOW (ref 30.0–36.0)
MCV: 84.3 fL (ref 80.0–100.0)
Monocytes Absolute: 0.7 10*3/uL (ref 0.1–1.0)
Monocytes Relative: 8 %
Neutro Abs: 5.8 10*3/uL (ref 1.7–7.7)
Neutrophils Relative %: 73 %
Platelets: 183 10*3/uL (ref 150–400)
RBC: 4.47 MIL/uL (ref 3.87–5.11)
RDW: 18.3 % — ABNORMAL HIGH (ref 11.5–15.5)
WBC: 8 10*3/uL (ref 4.0–10.5)
nRBC: 0 % (ref 0.0–0.2)

## 2019-08-16 LAB — URINALYSIS, ROUTINE W REFLEX MICROSCOPIC
Bacteria, UA: NONE SEEN
Bilirubin Urine: NEGATIVE
Glucose, UA: NEGATIVE mg/dL
Ketones, ur: NEGATIVE mg/dL
Leukocytes,Ua: NEGATIVE
Nitrite: NEGATIVE
Protein, ur: NEGATIVE mg/dL
Specific Gravity, Urine: 1.015 (ref 1.005–1.030)
pH: 6 (ref 5.0–8.0)

## 2019-08-16 LAB — COMPREHENSIVE METABOLIC PANEL
ALT: 15 U/L (ref 0–44)
AST: 15 U/L (ref 15–41)
Albumin: 3.9 g/dL (ref 3.5–5.0)
Alkaline Phosphatase: 44 U/L (ref 38–126)
Anion gap: 10 (ref 5–15)
BUN: 14 mg/dL (ref 6–20)
CO2: 29 mmol/L (ref 22–32)
Calcium: 8.9 mg/dL (ref 8.9–10.3)
Chloride: 101 mmol/L (ref 98–111)
Creatinine, Ser: 0.7 mg/dL (ref 0.44–1.00)
GFR calc Af Amer: >60 mL/min (ref >60)
GFR calc non Af Amer: >60 mL/min (ref >60)
Glucose, Bld: 121 mg/dL — ABNORMAL HIGH (ref 70–99)
Potassium: 3.9 mmol/L (ref 3.5–5.1)
Sodium: 140 mmol/L (ref 135–145)
Total Bilirubin: 0.8 mg/dL (ref 0.3–1.2)
Total Protein: 7.4 g/dL (ref 6.5–8.1)

## 2019-08-16 LAB — BRAIN NATRIURETIC PEPTIDE: B Natriuretic Peptide: 40.6 pg/mL (ref 0.0–100.0)

## 2019-08-16 LAB — TROPONIN I (HIGH SENSITIVITY)
Troponin I (High Sensitivity): 6 ng/L (ref <18)
Troponin I (High Sensitivity): 7 ng/L (ref <18)

## 2019-08-16 MED ORDER — POTASSIUM CHLORIDE CRYS ER 20 MEQ PO TBCR
20.0000 meq | EXTENDED_RELEASE_TABLET | Freq: Every day | ORAL | Status: DC
  Administered 2019-08-16 – 2019-08-22 (×7): 20 meq via ORAL
  Filled 2019-08-16 (×7): qty 1

## 2019-08-16 MED ORDER — SODIUM CHLORIDE 0.9% FLUSH
3.0000 mL | INTRAVENOUS | Status: DC | PRN
  Administered 2019-08-19: 3 mL via INTRAVENOUS
  Filled 2019-08-16: qty 3

## 2019-08-16 MED ORDER — SODIUM CHLORIDE 0.9 % IV SOLN: 250.0000 mL | INTRAVENOUS | Status: DC | PRN

## 2019-08-16 MED ORDER — ENOXAPARIN SODIUM 100 MG/ML ~~LOC~~ SOLN
100.0000 mg | Freq: Every day | SUBCUTANEOUS | Status: DC
  Administered 2019-08-17 – 2019-08-21 (×5): 100 mg via SUBCUTANEOUS
  Filled 2019-08-16 (×5): qty 1

## 2019-08-16 MED ORDER — PREDNISONE 10 MG PO TABS
10.0000 mg | ORAL_TABLET | Freq: Every day | ORAL | Status: DC
  Administered 2019-08-16 – 2019-08-22 (×6): 10 mg via ORAL
  Filled 2019-08-16 (×7): qty 1

## 2019-08-16 MED ORDER — ATORVASTATIN CALCIUM 40 MG PO TABS
40.0000 mg | ORAL_TABLET | Freq: Every day | ORAL | Status: DC
  Administered 2019-08-17 – 2019-08-21 (×4): 40 mg via ORAL
  Filled 2019-08-16 (×5): qty 1

## 2019-08-16 MED ORDER — HYDROCODONE-ACETAMINOPHEN 5-325 MG PO TABS
1.0000 | ORAL_TABLET | ORAL | Status: DC | PRN
  Administered 2019-08-18 – 2019-08-20 (×4): 1 via ORAL
  Filled 2019-08-16 (×2): qty 1
  Filled 2019-08-16: qty 2
  Filled 2019-08-16: qty 1
  Filled 2019-08-16: qty 2

## 2019-08-16 MED ORDER — ACETAMINOPHEN 650 MG RE SUPP: 650.0000 mg | Freq: Four times a day (QID) | RECTAL | Status: DC | PRN

## 2019-08-16 MED ORDER — METOPROLOL SUCCINATE ER 25 MG PO TB24
25.0000 mg | ORAL_TABLET | Freq: Every day | ORAL | Status: DC
  Administered 2019-08-16 – 2019-08-22 (×7): 25 mg via ORAL
  Filled 2019-08-16 (×7): qty 1

## 2019-08-16 MED ORDER — FENTANYL CITRATE (PF) 100 MCG/2ML IJ SOLN
50.0000 ug | Freq: Once | INTRAMUSCULAR | Status: AC
  Administered 2019-08-16: 50 ug via INTRAVENOUS
  Filled 2019-08-16: qty 2

## 2019-08-16 MED ORDER — ENOXAPARIN SODIUM 40 MG/0.4ML ~~LOC~~ SOLN
40.0000 mg | Freq: Every day | SUBCUTANEOUS | Status: DC
  Administered 2019-08-16: 40 mg via SUBCUTANEOUS
  Filled 2019-08-16: qty 0.4

## 2019-08-16 MED ORDER — POTASSIUM CHLORIDE CRYS ER 20 MEQ PO TBCR
40.0000 meq | EXTENDED_RELEASE_TABLET | Freq: Once | ORAL | Status: AC
  Administered 2019-08-16: 40 meq via ORAL
  Filled 2019-08-16: qty 2

## 2019-08-16 MED ORDER — ACETAMINOPHEN 325 MG PO TABS
650.0000 mg | ORAL_TABLET | Freq: Four times a day (QID) | ORAL | Status: DC | PRN
  Administered 2019-08-17: 650 mg via ORAL
  Filled 2019-08-16: qty 2

## 2019-08-16 MED ORDER — SODIUM CHLORIDE 0.9% FLUSH
3.0000 mL | Freq: Two times a day (BID) | INTRAVENOUS | Status: DC
  Administered 2019-08-16 – 2019-08-21 (×10): 3 mL via INTRAVENOUS

## 2019-08-16 MED ORDER — ONDANSETRON HCL 4 MG/2ML IJ SOLN: 4.0000 mg | Freq: Four times a day (QID) | INTRAMUSCULAR | Status: DC | PRN

## 2019-08-16 MED ORDER — SODIUM CHLORIDE (PF) 0.9 % IJ SOLN
INTRAMUSCULAR | Status: AC
  Filled 2019-08-16: qty 50

## 2019-08-16 MED ORDER — IOHEXOL 350 MG/ML SOLN
100.0000 mL | Freq: Once | INTRAVENOUS | Status: AC | PRN
  Administered 2019-08-16: 100 mL via INTRAVENOUS

## 2019-08-16 MED ORDER — FAMOTIDINE 20 MG PO TABS
40.0000 mg | ORAL_TABLET | Freq: Two times a day (BID) | ORAL | Status: DC
  Administered 2019-08-16 – 2019-08-22 (×12): 40 mg via ORAL
  Filled 2019-08-16 (×12): qty 2

## 2019-08-16 MED ORDER — FUROSEMIDE 10 MG/ML IJ SOLN
40.0000 mg | Freq: Two times a day (BID) | INTRAMUSCULAR | Status: DC
  Administered 2019-08-17: 40 mg via INTRAVENOUS
  Filled 2019-08-16 (×2): qty 4

## 2019-08-16 MED ORDER — FUROSEMIDE 10 MG/ML IJ SOLN
80.0000 mg | Freq: Once | INTRAMUSCULAR | Status: AC
  Administered 2019-08-16: 80 mg via INTRAVENOUS
  Filled 2019-08-16: qty 8

## 2019-08-16 MED ORDER — ONDANSETRON HCL 4 MG PO TABS: 4.0000 mg | ORAL_TABLET | Freq: Four times a day (QID) | ORAL | Status: DC | PRN

## 2019-08-16 MED ORDER — ALBUTEROL SULFATE (2.5 MG/3ML) 0.083% IN NEBU: 3.0000 mL | INHALATION_SOLUTION | Freq: Four times a day (QID) | RESPIRATORY_TRACT | Status: DC | PRN

## 2019-08-16 MED ORDER — LOSARTAN POTASSIUM 50 MG PO TABS
100.0000 mg | ORAL_TABLET | Freq: Every day | ORAL | Status: DC
  Administered 2019-08-16 – 2019-08-22 (×7): 100 mg via ORAL
  Filled 2019-08-16 (×7): qty 2

## 2019-08-16 NOTE — H&P (Signed)
[name] [id number] DOB: Apr 15, 1966 DOA: 08/16/2019     PCP: [name], NP   Outpatient Specialists:   CARDS:   Dr. [name]   Pulmonary    Dr. Kara Mead   Oncology Dr. Burr [facility] .  (  Livia Snellen, Kenney Houseman, MD  Rheumatology Gavin Pound, MD as   Patient arrived to ER on 08/16/19 at 1155  Patient coming from: home Lives With family    Chief Complaint:   Chief Complaint  Patient presents with  . Shortness of Breath    HPI: [name] is a 53 y.o. female with medical history significant of CHF (last EF> 65%), COPD, chronic respiratory failure on 2L via [place], GERD, HTN, hypothyroidism, sarcoidosis, OSA, anemia, & hyperlipidemia, iron deficiency anemia   Presented with   3 wk hx of SOB and proggresive leg swelling, pain dyspnea on exertion and orthopnea lower extremity swelling. She used to be on Lasix IV 20 mg twice a day but it was changed to 40 mg twice a day she states because of her kidney function.  She also noted some left lower extremity pain and that has prevented her from using the bathroom and ambulating.  She has history of arthritis and have not gotten her shots secondary to Covid pandemic She has had numerous DVT studies in the past to rule out blood clot and this is ongoing chronic issue. Patient is sedentary and unable to ambulate easily. Reports some chest tightness which is chronic and she attributes to her history of sarcoidosis. No fevers or chills no cough no abdominal pain no nausea no vomiting or diarrhea no syncope   Last admission for CHF exacerbation was in July 2020  at that time she tolerated while IV diuresis  When her Lasix was increased to 120 twice daily patient states she could not urinate at all cardiology thought perhaps she was too dry and she does not require such a high dose she has been doing better on 40 g.  Infectious risk factors:  Reports  shortness of breath  In  ER RAPID COVID TEST  in house testing   Pending  Lab Results  Component Value Date   [place] NEGATIVE 05/08/2019     Regarding pertinent Chronic problems:     Hyperlipidemia -  on statins lipitor   HTN on Cozaar, toprolol   CHF diastolic  -  echo [id number] mild LVH. Systolic function was normal. The estimated ejection fraction was in the range of 55% to 60%. PA peak pressure: 35 mm Hg (S).  In July 2020 EF >65% hyperdynamic systolic function   Sarcoidosis - Methotrexate prednisone      Hypothyroidism:  Lab Results  Component Value Date   TSH 2.340 04/03/2019   on synthroid    Morbid obesity-   BMI Readings from Last 1 Encounters:  07/31/19 82.87 kg/m      COPD -  followed by pulmonology on baseline oxygen  2L,       OSA -on nocturnal  CPAP,      While in ER:  The following Work up has been ordered so far:  Orders Placed This Encounter  Procedures  . SARS CORONAVIRUS 2 (TAT 6-24 HRS) Nasopharyngeal Nasopharyngeal Swab  . DG Chest 2 View  . CT Angio Chest PE W/Cm &/Or Wo Cm  . Brain natriuretic peptide  . CBC with Differential  . Comprehensive metabolic panel  . Consult to hospitalist  ALL PATIENTS BEING ADMITTED/HAVING PROCEDURES NEED COVID-19  SCREENING  . Airborne and Contact precautions  . EKG 12-Lead  . ED EKG      Following Medications were ordered in ER: Medications  sodium chloride (PF) 0.9 % injection (has no administration in time range)  fentaNYL (SUBLIMAZE) injection 50 mcg (50 mcg Intravenous Given 08/16/19 1448)  iohexol (OMNIPAQUE) 350 MG/ML injection 100 mL (100 mLs Intravenous Contrast Given 08/16/19 1656)  furosemide (LASIX) injection 80 mg (80 mg Intravenous Given 08/16/19 1808)  potassium chloride SA (KLOR-CON) CR tablet 40 mEq (40 mEq Oral Given 08/16/19 1808)        Consult Orders  (From admission, onward)         Start     Ordered   08/16/19 1748  Consult to hospitalist  ALL PATIENTS BEING ADMITTED/HAVING PROCEDURES NEED COVID-19 SCREENING  Once     Comments: ALL PATIENTS BEING ADMITTED/HAVING PROCEDURES NEED COVID-19 SCREENING  Provider:  (Not yet assigned)  Question Answer Comment  Place call to: [facility]   Reason for Consult Admit      08/16/19 1747            Significant initial  Findings: Abnormal Labs Reviewed  CBC WITH DIFFERENTIAL/PLATELET - Abnormal; Notable for the following components:      Result Value   Hemoglobin 10.8 (*)    MCH 24.2 (*)    MCHC 28.6 (*)    RDW 18.3 (*)    All other components within normal limits  COMPREHENSIVE METABOLIC PANEL - Abnormal; Notable for the following components:   Glucose, Bld 121 (*)    All other components within normal limits     Otherwise labs showing:    Recent Labs  Lab 08/16/19 1230  NA 140  K 3.9  CO2 29  GLUCOSE 121*  BUN 14  CREATININE 0.70  CALCIUM 8.9    Cr    Stable,  Lab Results  Component Value Date   CREATININE 0.70 08/16/2019   CREATININE 0.72 05/12/2019   CREATININE 0.86 05/11/2019    Recent Labs  Lab 08/16/19 1230  AST 15  ALT 15  ALKPHOS 44  BILITOT 0.8  PROT 7.4  ALBUMIN 3.9   Lab Results  Component Value Date   CALCIUM 8.9 08/16/2019   PHOS 3.7 01/16/2018   WBC      Component Value Date/Time   WBC 8.0 08/16/2019 1230   ANC    Component Value Date/Time   NEUTROABS 5.8 08/16/2019 1230   NEUTROABS 6.5 10/18/2017 0801   ALC No components found for: LYMPHAB    Plt: Lab Results  Component Value Date   PLT 183 08/16/2019    Lactic Acid, Venous No results found for: LATICACIDVEN    COVID-19 Labs  No results for input(s): DDIMER, FERRITIN, LDH, CRP in the last 72 hours.  Lab Results  Component Value Date   [place] NEGATIVE 05/08/2019   HG/HCT   stable,      Component Value Date/Time   HGB 10.8 (L) 08/16/2019 1230   HGB 11.8 (L) 04/15/2019 0837   HGB 12.0 10/18/2017 0801   HCT 37.7 08/16/2019 1230   HCT 39.0 10/18/2017 0801     Troponin 4-7 ordered Cardiac Panel (last 3 results) No  results for input(s): CKTOTAL, CKMB, TROPONINI, RELINDX in the last 72 hours.     ECG: Ordered Personally reviewed by me showing: HR : 90 Rhythm:  NSR,    no evidence of ischemic changes QTC 463   BNP (last 3 results) Recent Labs  10/26/18 2325 05/08/19 0953 08/16/19 1230  BNP 52.8 54.2 40.6    ProBNP (last 3 results) No results for input(s): PROBNP in the last 8760 hours.  DM  labs:  HbA1C: Recent Labs    05/08/19 0811  HGBA1C 5.7       CBG (last 3)  No results for input(s): GLUCAP in the last 72 hours.     UA    ordered        Ordered   CXR -  NON acute   CTA chest -  no PE, cardiomegaly and fluid overload      ED Triage Vitals  Enc Vitals Group     BP 08/16/19 1213 (!) 166/80     Pulse Rate 08/16/19 1213 93     Resp 08/16/19 1213 (!) 24     Temp 08/16/19 1213 98.7 F (37.1 C)     Temp src --      SpO2 08/16/19 1213 98 %     Weight --      Height --      Head Circumference --      Peak Flow --      Pain Score 08/16/19 1204 10     Pain Loc --      Pain Edu? --      Excl. in [place]? --   TMAX(24)@       Latest  Blood pressure (!) 169/95, pulse 85, temperature 98.7 F (37.1 C), resp. rate (!) 29, SpO2 99 %.     Hospitalist was called for admission for CHF exacerbation    Review of Systems:    Pertinent positives include: fatigue, Bilateral lower extremity swelling  shortness of breath at rest dyspnea on exertion,   Constitutional:  No weight loss, night sweats, Fevers, chills, weight loss  HEENT:  No headaches, Difficulty swallowing,Tooth/dental problems,Sore throat,  No sneezing, itching, ear ache, nasal congestion, post nasal drip,  Cardio-vascular:  No chest pain, Orthopnea, PND, anasarca, dizziness, palpitations.no GI:  No heartburn, indigestion, abdominal pain, nausea, vomiting, diarrhea, change in bowel habits, loss of appetite, melena, blood in stool, hematemesis Resp:   No excess mucus, no productive cough, No  non-productive cough, No coughing up of blood.No change in color of mucus.No wheezing. Skin:  no rash or lesions. No jaundice GU:  no dysuria, change in color of urine, no urgency or frequency. No straining to urinate.  No flank pain.  Musculoskeletal:  No joint pain or no joint swelling. No decreased range of motion. No back pain.  Psych:  No change in mood or affect. No depression or anxiety. No memory loss.  Neuro: no localizing neurological complaints, no tingling, no weakness, no double vision, no gait abnormality, no slurred speech, no confusion  All systems reviewed and apart from [place] all are negative  Past Medical History:   Past Medical History:  Diagnosis Date  . Anemia   . Arthritis    hands, shoulders, no meds  . CHF (congestive heart failure) (HCC)    EF60-65%  . Chronic hyperventilation syndrome    w/ obesity tx with albuterol inhaler and oxygen 2L  . COPD (chronic obstructive pulmonary disease) ([place])    uses oxygen 2 L  . Gallstones   . GERD (gastroesophageal reflux disease)    diet controlled - no meds  . H/O hiatal hernia   . Hypertension   . Hypothyroidism   . Kidney stones   . Morbid obesity ([place])   . Pneumonia  hoispitalized in 08/2011  . Sarcoidosis   . Seasonal allergies   . Sleep apnea    uses CPAP machine       Past Surgical History:  Procedure Laterality Date  . [place]   x 1  . CHOLECYSTECTOMY  2000  . DILATION AND CURETTAGE OF UTERUS N/A 08/09/2016   Procedure: DILATATION AND CURETTAGE;  Surgeon: Everitt Amber, MD;  Location: [facility];  Service: Gynecology;  Laterality: N/A;  . HYSTEROSCOPY W/D&C  12/27/2011   Procedure: DILATATION AND CURETTAGE /HYSTEROSCOPY;  Surgeon: Maeola Sarah. Landry Mellow, MD;  Location: [facility];  Service: Gynecology;;  . I and D of abcess  05/2011  . INTRAUTERINE DEVICE (IUD) INSERTION N/A 08/09/2016   Procedure: INTRAUTERINE DEVICE (IUD) INSERTION;  Surgeon: Everitt Amber, MD;  Location: [facility];  Service:  Gynecology;  Laterality: N/A;  . LUNG BIOPSY    . uterine abletion      Social History:  Ambulatory    Independently      reports that she has never smoked. She has never used smokeless tobacco. She reports current alcohol use. She reports that she does not use drugs.  Family History:   Family History  Problem Relation Age of Onset  . Diabetes Father   . Cancer Father 7       colon cancer   . Diabetes Brother   . Deep vein thrombosis Mother   . Aneurysm Sister        d/o brain aneurysm    Allergies: No Known Allergies   Prior to Admission medications   Medication Sig Start Date End Date Taking? Authorizing Provider  albuterol (PROAIR HFA) 108 (90 Base) MCG/ACT inhaler Inhale 1-2 puffs into the lungs every 6 (six) hours as needed for wheezing or shortness of breath. 10/29/18  Yes Hongalgi, Lenis Dickinson, MD  atorvastatin (LIPITOR) 40 MG tablet Take 1 tablet (40 mg total) by mouth daily at 6 PM. 05/21/19  Yes Nafziger, Tommi Rumps, NP  calcium citrate-vitamin D (CITRACAL+D) 315-200 MG-UNIT tablet Take 1 tablet by mouth daily.   Yes [provider]  cetirizine (ZYRTEC) 10 MG tablet Take 10 mg by mouth at bedtime as needed for allergies.   Yes [provider]  diphenhydrAMINE (BENADRYL) 25 mg capsule Take 25 mg by mouth every 6 (six) hours as needed (fever).   Yes [provider]  famotidine (PEPCID) 40 MG tablet Take 1 tablet (40 mg total) by mouth 2 (two) times daily. 11/13/18 08/16/19 Yes Nafziger, Tommi Rumps, NP  folic acid (FOLVITE) 1 MG tablet Take 1 mg by mouth daily. 01/11/16  Yes [provider]  furosemide (LASIX) 40 MG tablet Take as directed by cardiologist Patient taking differently: Take 40 mg by mouth daily. Take as directed by cardiologist 05/13/19  Yes Johnson, Clanford L, MD  HYDROcodone-acetaminophen (NORCO) 10-325 MG tablet Take 1 tablet by mouth every 6 (six) hours as needed (pain). 11/21/16  Yes Pete Pelt, PA-C  losartan (COZAAR) 100 MG  tablet TAKE 1 TABLET (100 MG TOTAL) DAILY BY MOUTH. 05/22/19  Yes Nafziger, Tommi Rumps, NP  methotrexate 50 MG/2ML injection Inject 20 mg into the vein once a week. Dose is 0.8 mL (20mg ). Take on Fridays   Yes [provider]  metoprolol succinate (TOPROL-XL) 25 MG 24 hr tablet TAKE 1 TABLET BY MOUTH EVERY DAY Patient taking differently: Take 25 mg by mouth daily.  05/22/19  Yes Nafziger, Tommi Rumps, NP  predniSONE (DELTASONE) 10 MG tablet Take 1 tablet (10 mg total)  by mouth daily. 01/17/18  Yes Florencia Reasons, MD  triamcinolone acetonide (KENALOG) 40 MG/ML injection Inject 40 mg into the muscle every 3 (three) months. For knee pain   Yes [provider]  triamcinolone ointment (KENALOG) 0.1 % Apply 1 application topically 2 (two) times daily. For Sarcoidosis   Yes [provider]  vitamin B-12 1000 MCG tablet Take 1 tablet (1,000 mcg total) by mouth daily. 06/19/16  Yes Lavina Hamman, MD  Vitamin D, Ergocalciferol, (DRISDOL) 1.25 MG (50000 UT) CAPS capsule Take 1 capsule (50,000 Units total) by mouth every 7 (seven) days. 05/21/19 08/19/19 Yes Nafziger, Tommi Rumps, NP  NON FORMULARY 2 liter of oxygen    [provider]  potassium chloride SA (K-DUR) 20 MEQ tablet Take as directed by cardiologist Patient taking differently: Take 20 mEq by mouth daily.  05/12/19   Murlean Iba, MD   Physical Exam: Blood pressure (!) 169/95, pulse 85, temperature 98.7 F (37.1 C), resp. rate (!) 29, SpO2 99 %. 1. General:  in No  Acute distress    Chronically ill  -appearing 2. Psychological: Alert and   Oriented 3. Head/ENT:   Moist Mucous Membranes                          Head Non traumatic, neck supple                           Poor Dentition 4. SKIN: normal Skin turgor,  Skin clean Dry and intact no rash 5. Heart: Regular rate and rhythm no  Murmur, no Rub or gallop 6. Lungs:distant  no wheezes or crackles   7. Abdomen: Soft non-tender, Non distended obese  8. Lower extremities: no  clubbing, cyanosis, 2+ edema 9. Neurologically Grossly intact, moving all 4 extremities equally   10. MSK: Normal range of motion   All other LABS:     Recent Labs  Lab 08/16/19 1230  WBC 8.0  NEUTROABS 5.8  HGB 10.8*  HCT 37.7  MCV 84.3  PLT 183     Recent Labs  Lab 08/16/19 1230  NA 140  K 3.9  CL 101  CO2 29  GLUCOSE 121*  BUN 14  CREATININE 0.70  CALCIUM 8.9     Recent Labs  Lab 08/16/19 1230  AST 15  ALT 15  ALKPHOS 44  BILITOT 0.8  PROT 7.4  ALBUMIN 3.9       Cultures:    Component Value Date/Time   SDES URINE, CLEAN CATCH 02/26/2016 2232   SPECREQUEST NONE 02/26/2016 2232   CULT (A) 02/26/2016 2232    >=100,000 COLONIES/mL GROUP B STREP(S.AGALACTIAE)ISOLATED TESTING AGAINST S. AGALACTIAE NOT ROUTINELY PERFORMED DUE TO PREDICTABILITY OF AMP/PEN/VAN SUSCEPTIBILITY. >=100,000 COLONIES/mL LACTOBACILLUS SPECIES Standardized susceptibility testing for this organism is not available. Performed at [facility] 02/28/2016 FINAL 02/26/2016 2232     Radiological Exams on Admission: Dg Chest 2 View  Result Date: 08/16/2019 CLINICAL DATA:  Morbid obesity.  Shortness of breath and weakness. EXAM: CHEST - 2 VIEW COMPARISON:  October 26, 2018 FINDINGS: The study is limited due to patient body habitus. Stable cardiomegaly. The hila and mediastinum are normal. No pneumothorax. No pulmonary nodules, masses, or focal infiltrates identified. IMPRESSION: Limited study due to patient body habitus. No definite abnormalities. Electronically Signed   By: Dorise Bullion III M.D   On: 08/16/2019 13:08   Ct Angio Chest  Pe W/cm &/or Wo Cm  Result Date: 08/16/2019 CLINICAL DATA:  Shortness of breath for 3 weeks EXAM: CT ANGIOGRAPHY CHEST WITH CONTRAST TECHNIQUE: Multidetector CT imaging of the chest was performed using the standard protocol during bolus administration of intravenous contrast. Multiplanar CT image reconstructions and MIPs were  obtained to evaluate the vascular anatomy. CONTRAST:  127mL OMNIPAQUE IOHEXOL 350 MG/ML SOLN COMPARISON:  05/10/2019 FINDINGS: Cardiovascular: Stable cardiomegaly. No pericardial effusion. Examination is limited by contrast bolus timing and beam hardening artifact from patient body habitus. No central filling defect to suggest pulmonary embolism. There is cephalization of the pulmonary vasculature. Thoracic aorta normal in course and caliber. Mediastinum/Nodes: Mildly enlarged right paratracheal lymph node measures 11 mm short axis (series 6, image 30), not significantly changed from prior studies. No axillary or hilar lymphadenopathy. Enlarged thyroid gland, unchanged. Trachea and esophagus within normal limits. Lungs/Pleura: There is ill-defined ground-glass opacity in a predominantly perihilar distribution bilaterally. No focal airspace consolidation. No pleural effusion. No pneumothorax. Upper Abdomen: No acute findings. Musculoskeletal: No acute findings. Review of the MIP images confirms the above findings. IMPRESSION: 1. Limited exam.  No large central pulmonary embolus. 2. Cardiomegaly and pulmonary vascular congestion with ill-defined ground-glass opacity in a predominantly perihilar distribution. Findings are nonspecific but can be seen in the setting of pulmonary edema versus atypical infection in the appropriate clinical setting. Electronically Signed   By: Davina Poke M.D.   On: 08/16/2019 17:33    Chart has been reviewed    Assessment/Plan  53 y.o. female with medical history significant of CHF (last EF> 65%), COPD, chronic respiratory failure on 2L via [place], GERD, HTN, hypothyroidism, sarcoidosis, OSA, anemia, & hyperlipidemia, iron deficiency anemia Admitted for diastolic CHF exacerbation  Present on Admission: . Acute on chronic diastolic (congestive) heart failure ([place]) -  - admit on telemetry,  cycle cardiac enzymes, Troponin   obtain serial ECG  to evaluate for ischemia as a  cause of heart failure  monitor daily weight: There were no vitals filed for this visit. Last BNP BNP (last 3 results) Recent Labs    10/26/18 2325 05/08/19 0953 08/16/19 1230  BNP 52.8 54.2 40.6      diurese with IV lasix and monitor orthostatics and creatinine to avoid over diuresis.  Order echogram to evaluate EF and valves  ACE/ARBi ordered     . OSA (obstructive sleep apnea) - restart on  CPAP when COVID negative  . Obesity hypoventilation syndrome (HCC) -morning secondary to severe morbid obesity  . Hypertension -chronic stable continue medications  . Iron deficiency anemia due to chronic blood loss -currently at baseline followed by hematology  . Sarcoidosis -chronic stable continue home medications  . Hypothyroidism - - Check TSH   . Morbid obesity ([place]) -resulted in multiple comorbidities with a BMI of 83 would benefit from nutritional consult as an outpatient  . SOB (shortness of breath) likely secondary to fluid overload no evidence of PE on CTA  . Hyperlipidemia -chronic stable  . GERD (gastroesophageal reflux disease) -chronic stable    Other plan as per orders.  DVT prophylaxis:    Lovenox     Code Status:  FULL CODE as per patient   I had personally discussed CODE STATUS with patient  Family Communication:   Family not at  Bedside    Disposition Plan:      To home once workup is complete and patient is stable  Would benefit from PT/OT eval prior to DC  Ordered                   Consults called: none  Admission status:  ED Disposition    ED Disposition Condition Comment   Admit  The patient appears reasonably stabilized for admission considering the current resources, flow, and capabilities available in the ED at this time, and I doubt any other [facility] requiring further screening and/or treatment in the ED prior to admission is  present.       Obs        Level of care   tele  For  24H      please discontinue once patient no  longer qualifies  Precautions:   Airborne and Contact precautions  PPE: Used by the provider:   P100  eye Goggles,  Gloves     Numan Zylstra 08/16/2019, 7:46 PM    [facility]     after 2 AM please page floor coverage PA If 7AM-7PM, please contact the day team taking care of the patient using [email or web address]

## 2019-08-16 NOTE — ED Triage Notes (Signed)
Per [facility], patient from home, c/o SOB x 3 weeks worsening x5 days. Hx COPD, CHF. Denies cough, fever, N/V/D. On 2L [place] at baseline. SOB worsens on exertion. Ambulatory at baseline.   Also reports pain to right knee. States hx of arthritis and has not been able to get injections for months.

## 2019-08-16 NOTE — ED Notes (Signed)
Patient transported to CT 

## 2019-08-16 NOTE — Progress Notes (Signed)
Rx Brief note: Lovenox   Wt = 513 lb, BMI =85, CrCl >100 ml/min  Rx will adjust Lovenox to 100 mg sq daily (~0.5 mg/kg) in pt with BMI >30  Thanks Dorrene German 08/16/2019 10:39 PM

## 2019-08-16 NOTE — ED Notes (Signed)
Patient transported to X-ray 

## 2019-08-16 NOTE — ED Notes (Signed)
Hospitalist at bedside 

## 2019-08-16 NOTE — ED Provider Notes (Signed)
[facility] DEPT Provider Note   CSN: [id number] Arrival date & time: 08/16/19  1155     History   Chief Complaint Chief Complaint  Patient presents with  . Shortness of Breath    HPI [name] is a 53 y.o. female with a history of CHF (last EF> 65%), COPD, chronic respiratory failure on 2L via [place], GERD, HTN, hypothyroidism, sarcoidosis, OSA, anemia, & hyperlipidemia who presents to the ED with complaints of progressively worsening shortness of breath for the past 3 days.  Patient states that she feels short of breath primarily with exertion and when trying to lay in the supine position.  No other alleviating or aggravating factors.  She states that if she sits at rest she is not significantly short of breath.  Has had some mild lower extremity swelling.  She states this feels similar to prior to issues with fluid overload requiring hospital admission.  She states she was recently hospitalized in July of this year, had a decrease in her Lasix from 120 mg twice daily to 40 mg twice daily.  Taking this as prescribed until 5 days ago when she started to have increased left lower extremity pain which limited her mobility therefore she started taking 40 mg once per day to avoid having to get up and move to go to the bathroom.  She states her left lower extremity pain is to the entire leg, primarily at the knee, it feels similar to prior pain she has had, she gets injections to the left knee frequently, she is due for one this Monday, she states this feels like similar pain.  She states with similar pain she has had ultrasounds to rule out blood clot and they have all been negative.  She states she has some chest tightness which she has all the time with her sarcoidosis, no acute change.  She denies fever, URI symptoms, cough, abdominal pain, vomiting, diarrhea, changes in taste/smell, passing out, or any other concerns.`     HPI  Past Medical History:   Diagnosis Date  . Anemia   . Arthritis    hands, shoulders, no meds  . CHF (congestive heart failure) (HCC)    EF60-65%  . Chronic hyperventilation syndrome    w/ obesity tx with albuterol inhaler and oxygen 2L  . COPD (chronic obstructive pulmonary disease) ([place])    uses oxygen 2 L  . Gallstones   . GERD (gastroesophageal reflux disease)    diet controlled - no meds  . H/O hiatal hernia   . Hypertension   . Hypothyroidism   . Kidney stones   . Morbid obesity ([place])   . Pneumonia    hoispitalized in 08/2011  . Sarcoidosis   . Seasonal allergies   . Sleep apnea    uses CPAP machine     Patient Active Problem List   Diagnosis Date Noted  . Acute on chronic diastolic (congestive) heart failure ([place]) 05/08/2019  . CHF exacerbation ([place]) 04/17/2018  . Unilateral primary osteoarthritis, left knee 02/20/2017  . Unilateral primary osteoarthritis, right knee 02/20/2017  . COPD exacerbation ([place]) 02/12/2017  . GERD (gastroesophageal reflux disease) 02/12/2017  . Abnormal uterine bleeding 08/09/2016  . Microcytic anemia 02/27/2016  . Hyperlipidemia 02/24/2016  . Anxiety 04/14/2015  . SOB (shortness of breath)   . Hypoxemia   . Symptomatic anemia 01/15/2015  . Hypothyroidism 01/15/2015  . Morbid obesity ([place]) 01/15/2015  . Fibroids 12/09/2013  . Hyperglycemia 09/16/2013  . Sarcoidosis 09/09/2012  .  Iron deficiency anemia due to chronic blood loss 09/09/2012  . Hypertension 02/25/2011  . Obesity hypoventilation syndrome ([place]) 02/14/2011  . OSA (obstructive sleep apnea) 02/10/2011    Past Surgical History:  Procedure Laterality Date  . [place]   x 1  . CHOLECYSTECTOMY  2000  . DILATION AND CURETTAGE OF UTERUS N/A 08/09/2016   Procedure: DILATATION AND CURETTAGE;  Surgeon: Everitt Amber, MD;  Location: [facility];  Service: Gynecology;  Laterality: N/A;  . HYSTEROSCOPY W/D&C  12/27/2011   Procedure: DILATATION AND CURETTAGE /HYSTEROSCOPY;  Surgeon: Maeola Sarah. Landry Mellow,  MD;  Location: [facility];  Service: Gynecology;;  . I and D of abcess  05/2011  . INTRAUTERINE DEVICE (IUD) INSERTION N/A 08/09/2016   Procedure: INTRAUTERINE DEVICE (IUD) INSERTION;  Surgeon: Everitt Amber, MD;  Location: [facility];  Service: Gynecology;  Laterality: N/A;  . LUNG BIOPSY    . uterine abletion       OB History   No obstetric history on file.      Home Medications    Prior to Admission medications   Medication Sig Start Date End Date Taking? Authorizing Provider  albuterol (PROAIR HFA) 108 (90 Base) MCG/ACT inhaler Inhale 1-2 puffs into the lungs every 6 (six) hours as needed for wheezing or shortness of breath. 10/29/18   Hongalgi, Lenis Dickinson, MD  atorvastatin (LIPITOR) 40 MG tablet Take 1 tablet (40 mg total) by mouth daily at 6 PM. 05/21/19   Nafziger, Tommi Rumps, NP  calcium citrate-vitamin D (CITRACAL+D) 315-200 MG-UNIT tablet Take 1 tablet by mouth daily.    [provider]  cetirizine (ZYRTEC) 10 MG tablet Take 10 mg by mouth at bedtime as needed for allergies.    [provider]  diphenhydrAMINE (BENADRYL) 25 mg capsule Take 25 mg by mouth every 6 (six) hours as needed (fever).    [provider]  DUREZOL 0.05 % EMUL Place 1 drop into the left eye 3 (three) times daily.  08/01/18   [provider]  famotidine (PEPCID) 40 MG tablet Take 1 tablet (40 mg total) by mouth 2 (two) times daily. 11/13/18 07/31/19  Nafziger, Tommi Rumps, NP  folic acid (FOLVITE) 1 MG tablet Take 1 mg by mouth daily. 01/11/16   [provider]  furosemide (LASIX) 40 MG tablet Take as directed by cardiologist Patient taking differently: Take 40 mg by mouth daily. Take as directed by cardiologist 05/13/19   Murlean Iba, MD  HYDROcodone-acetaminophen (NORCO) 10-325 MG tablet Take 1 tablet by mouth every 6 (six) hours as needed (pain). 11/21/16   Pete Pelt, PA-C  ketorolac (ACULAR) 0.5 % ophthalmic solution  08/23/18   [provider]  losartan (COZAAR) 100  MG tablet TAKE 1 TABLET (100 MG TOTAL) DAILY BY MOUTH. 05/22/19   Nafziger, Tommi Rumps, NP  methotrexate 50 MG/2ML injection Inject 20 mg into the vein once a week. Dose is 0.8 mL (20mg ). Take on Fridays    [provider]  metoprolol succinate (TOPROL-XL) 25 MG 24 hr tablet TAKE 1 TABLET BY MOUTH EVERY DAY 05/22/19   Nafziger, Tommi Rumps, NP  NON FORMULARY 2 liter of oxygen    [provider]  potassium chloride SA (K-DUR) 20 MEQ tablet Take as directed by cardiologist 05/12/19   Murlean Iba, MD  predniSONE (DELTASONE) 10 MG tablet Take 1 tablet (10 mg total) by mouth daily. 01/17/18   Florencia Reasons, MD  triamcinolone acetonide (KENALOG) 40 MG/ML injection Inject 40 mg into the muscle every  3 (three) months. For knee pain    [provider]  triamcinolone ointment (KENALOG) 0.1 % Apply 1 application topically 2 (two) times daily. For Sarcoidosis    [provider]  vitamin B-12 1000 MCG tablet Take 1 tablet (1,000 mcg total) by mouth daily. 06/19/16   Lavina Hamman, MD  Vitamin D, Ergocalciferol, (DRISDOL) 1.25 MG (50000 UT) CAPS capsule Take 1 capsule (50,000 Units total) by mouth every 7 (seven) days. 05/21/19 08/19/19  [name], NP    Family History Family History  Problem Relation Age of Onset  . Diabetes Father   . Cancer Father 56       colon cancer   . Diabetes Brother   . Deep vein thrombosis Mother   . Aneurysm Sister        d/o brain aneurysm    Social History Social History   Tobacco Use  . Smoking status: Never Smoker  . Smokeless tobacco: Never Used  Substance Use Topics  . Alcohol use: Yes    Comment: occasionally  . Drug use: No     Allergies   Patient has no known allergies.   Review of Systems Review of Systems  Constitutional: Negative for chills and fever.  HENT: Negative for congestion, facial swelling and sore throat.   Respiratory: Positive for chest tightness and shortness of breath. Negative for cough.    Cardiovascular: Positive for leg swelling.  Gastrointestinal: Negative for abdominal pain, diarrhea, nausea and vomiting.  Genitourinary: Negative for dysuria.  Musculoskeletal: Positive for arthralgias and myalgias.  Neurological: Negative for syncope.  All other systems reviewed and are negative.    Physical Exam Updated Vital Signs BP (!) 166/80   Pulse 93   Temp 98.7 F (37.1 C)   Resp (!) 24   SpO2 98%   Physical Exam Vitals signs and nursing note reviewed.  Constitutional:      General: She is not in acute distress.    Appearance: She is well-developed. She is obese. She is not ill-appearing or toxic-appearing.  HENT:     Head: Normocephalic and atraumatic.  Eyes:     General:        Right eye: No discharge.        Left eye: No discharge.     Conjunctiva/sclera: Conjunctivae normal.  Neck:     Musculoskeletal: Neck supple.  Cardiovascular:     Rate and Rhythm: Normal rate and regular rhythm.     Pulses:          Dorsalis pedis pulses are 2+ on the right side and 2+ on the left side.       Posterior tibial pulses are 2+ on the right side and 2+ on the left side.  Pulmonary:     Effort: No respiratory distress.     Breath sounds: Decreased breath sounds (bibasilar) present. No wheezing, rhonchi or rales.     Comments: SpO2 98% on baseline 2L via [place] Abdominal:     General: There is no distension.     Palpations: Abdomen is soft.     Tenderness: There is no abdominal tenderness.  Musculoskeletal:     Comments: Lower extremities: No obvious deformity, erythema, ecchymosis, warmth, or open wounds.  Diffusely tender to the left lower extremity.  Compartments are soft.  No significant pitting edema, morbidly obese.  Skin:    General: Skin is warm and dry.     Capillary Refill: Capillary refill takes less than 2 seconds.  Findings: No rash.  Neurological:     Mental Status: She is alert.     Comments: Alert. Clear speech. Sensation grossly intact to bilateral  lower extremities. 5/5 strength with plantar/dorsiflexion bilaterally.   Psychiatric:        Mood and Affect: Mood normal.        Behavior: Behavior normal.     ED Treatments / Results  Labs (all labs ordered are listed, but only abnormal results are displayed) Labs Reviewed  CBC WITH DIFFERENTIAL/PLATELET - Abnormal; Notable for the following components:      Result Value   Hemoglobin 10.8 (*)    MCH 24.2 (*)    MCHC 28.6 (*)    RDW 18.3 (*)    All other components within normal limits  COMPREHENSIVE METABOLIC PANEL - Abnormal; Notable for the following components:   Glucose, Bld 121 (*)    All other components within normal limits  BRAIN NATRIURETIC PEPTIDE  TROPONIN I (HIGH SENSITIVITY)  TROPONIN I (HIGH SENSITIVITY)    EKG EKG Interpretation  Date/Time:  Friday August 16 2019 12:11:15 EDT Ventricular Rate:  90 PR Interval:    QRS Duration: 93 QT Interval:  378 QTC Calculation: 463 R Axis:   73 Text Interpretation:  Sinus rhythm No STEMI  Confirmed by Octaviano Glow [phone]) on 08/16/2019 12:25:10 PM   Radiology Dg Chest 2 View  Result Date: 08/16/2019 CLINICAL DATA:  Morbid obesity.  Shortness of breath and weakness. EXAM: CHEST - 2 VIEW COMPARISON:  October 26, 2018 FINDINGS: The study is limited due to patient body habitus. Stable cardiomegaly. The hila and mediastinum are normal. No pneumothorax. No pulmonary nodules, masses, or focal infiltrates identified. IMPRESSION: Limited study due to patient body habitus. No definite abnormalities. Electronically Signed   By: Dorise Bullion III M.D   On: 08/16/2019 13:08   Ct Angio Chest Pe W/cm &/or Wo Cm  Result Date: 08/16/2019 CLINICAL DATA:  Shortness of breath for 3 weeks EXAM: CT ANGIOGRAPHY CHEST WITH CONTRAST TECHNIQUE: Multidetector CT imaging of the chest was performed using the standard protocol during bolus administration of intravenous contrast. Multiplanar CT image reconstructions and MIPs were obtained  to evaluate the vascular anatomy. CONTRAST:  152mL OMNIPAQUE IOHEXOL 350 MG/ML SOLN COMPARISON:  05/10/2019 FINDINGS: Cardiovascular: Stable cardiomegaly. No pericardial effusion. Examination is limited by contrast bolus timing and beam hardening artifact from patient body habitus. No central filling defect to suggest pulmonary embolism. There is cephalization of the pulmonary vasculature. Thoracic aorta normal in course and caliber. Mediastinum/Nodes: Mildly enlarged right paratracheal lymph node measures 11 mm short axis (series 6, image 30), not significantly changed from prior studies. No axillary or hilar lymphadenopathy. Enlarged thyroid gland, unchanged. Trachea and esophagus within normal limits. Lungs/Pleura: There is ill-defined ground-glass opacity in a predominantly perihilar distribution bilaterally. No focal airspace consolidation. No pleural effusion. No pneumothorax. Upper Abdomen: No acute findings. Musculoskeletal: No acute findings. Review of the MIP images confirms the above findings. IMPRESSION: 1. Limited exam.  No large central pulmonary embolus. 2. Cardiomegaly and pulmonary vascular congestion with ill-defined ground-glass opacity in a predominantly perihilar distribution. Findings are nonspecific but can be seen in the setting of pulmonary edema versus atypical infection in the appropriate clinical setting. Electronically Signed   By: Davina Poke M.D.   On: 08/16/2019 17:33    Procedures Procedures (including critical care time)  Medications Ordered in ED Medications  sodium chloride (PF) 0.9 % injection (has no administration in time range)  furosemide (LASIX) injection  80 mg (has no administration in time range)  potassium chloride SA (KLOR-CON) CR tablet 40 mEq (has no administration in time range)  fentaNYL (SUBLIMAZE) injection 50 mcg (50 mcg Intravenous Given 08/16/19 1448)  iohexol (OMNIPAQUE) 350 MG/ML injection 100 mL (100 mLs Intravenous Contrast Given 08/16/19  1656)     Initial Impression / Assessment and Plan / ED Course  I have reviewed the triage vital signs and the nursing notes.  Pertinent labs & imaging results that were available during my care of the patient were reviewed by me and considered in my medical decision making (see chart for details).   Patient presents to the ED w/ complaints of progressively worsening shortness of breath x 3 weeks. Taking Lasix as prescribed until LLE pain started (Hx of similar, usually improves with knee intra-articular injection, has r/o DVT w/ prior similar pain) limiting mobility therefore started taking 40 mg daily as opposed to BID to avoid excess trips to the bathroom which resultantly aggravated her dyspnea. Nontoxic appearing, SpO2 WNL on baseline O2 requirement, hypertensive. Decreased breath sounds @ the bases. DDX: CHF, pneumonia, PE, ACS, pneumothorax.   CBC: Anemia similar to prior. No leukocytosis.  CMP: Mild hyperglycemia. No electrolyte derangement. Renal function preserved.  Troponin: 6, 7 EKG: No STEMI, sinus BNP: 40.6 CXR: Limited study due to patient body habitus. No definite abnormalities.  CTA: 1. Limited exam.  No large central pulmonary embolus. 2. Cardiomegaly and pulmonary vascular congestion with ill-defined ground-glass opacity in a predominantly perihilar distribution. Findings are nonspecific but can be seen in the setting of pulmonary edema versus atypical infection in the appropriate clinical setting.   Patient w/ complex history. Has had issues w/ diuresis & renal function in the past. States this feels similar to prior sxs requiring admission and feels she would not do well at home especially given her LLE pain. Will obtain covid swab and consult for admission.  Findings and plan of care discussed with supervising physician Dr. Zenia Resides who is in agreement.   18:33: CONSULT: Discussed with hosptialist Dr. Roel Cluck who accepts admission.   Demaya CHAMPAYNE LEI was evaluated in  Emergency Department on 08/16/2019 for the symptoms described in the history of present illness. He/she was evaluated in the context of the global COVID-19 pandemic, which necessitated consideration that the patient might be at risk for infection with the SARS-CoV-2 virus that causes COVID-19. Institutional protocols and algorithms that pertain to the evaluation of patients at risk for COVID-19 are in a state of rapid change based on information released by regulatory bodies including the CDC and federal and state organizations. These policies and algorithms were followed during the patient's care in the ED.   Final Clinical Impressions(s) / ED Diagnoses   Final diagnoses:  Acute on chronic congestive heart failure, unspecified heart failure type [facility])    ED Discharge Orders    None       Amaryllis Dyke, PA-C 08/16/19 1834    Lacretia Leigh, MD 08/19/19 1218

## 2019-08-16 NOTE — ED Notes (Signed)
ED Provider at bedside. 

## 2019-08-16 NOTE — ED Notes (Signed)
Linen changed on patient's bed. Patient sitting up in chair. Requesting privacy to get dressed in clean gown. Call bell within reach.

## 2019-08-16 NOTE — ED Notes (Signed)
ED TO INPATIENT HANDOFF REPORT  Name/Age/Gender [name] 53 y.o. female  Code Status Code Status History    Date Active Date Inactive Code Status Order ID Comments User Context   05/08/2019 1548 05/12/2019 1725 Full Code [id number]  Alma Friendly, MD Inpatient   10/27/2018 0457 10/29/2018 1813 Full Code [id number]  Rise Patience, MD Inpatient   04/17/2018 0212 04/20/2018 1613 Full Code [id number]  Norval Morton, MD ED   01/10/2018 1616 01/17/2018 1640 Full Code [id number]  Hosie Poisson, MD Inpatient   02/12/2017 0139 02/16/2017 2116 Full Code [id number]  Reubin Milan, MD Inpatient   06/16/2016 1221 06/19/2016 1424 Full Code [id number]  Jani Gravel, MD Inpatient   02/27/2016 0452 02/28/2016 1848 Full Code [id number]  Vianne Bulls, MD ED   07/08/2015 1555 07/11/2015 1710 Full Code [id number]  Modena Jansky, MD Inpatient   01/15/2015 1709 01/18/2015 1550 Full Code [id number]  Theressa Millard, MD Inpatient   09/09/2012 1634 09/10/2012 1248 Full Code [id number]  Angus Palms, RN Inpatient   Advance Care Planning Activity      Home/SNF/Other Home  Chief Complaint shortness of breath  Level of Care/Admitting Diagnosis ED Disposition    ED Disposition Condition [facility] Area: [place] [[id number]]  Level of Care: Telemetry [5]  Admit to tele based on following criteria: Acute CHF  Covid Evaluation: Asymptomatic Screening Protocol (No Symptoms)  Diagnosis: Diastolic CHF ([place]) [id number]  Admitting Physician: Toy Baker [[id number]]  Attending Physician: Toy Baker [[id number]]  PT Class (Do Not Modify): Observation [104]  PT Acc Code (Do Not Modify): Observation [10022]       Medical History Past Medical History:  Diagnosis Date  . Anemia   . Arthritis    hands, shoulders, no meds  . CHF (congestive heart failure) (HCC)    EF60-65%  . Chronic hyperventilation syndrome    w/ obesity tx with albuterol  inhaler and oxygen 2L  . COPD (chronic obstructive pulmonary disease) ([place])    uses oxygen 2 L  . Gallstones   . GERD (gastroesophageal reflux disease)    diet controlled - no meds  . H/O hiatal hernia   . Hypertension   . Hypothyroidism   . Kidney stones   . Morbid obesity ([place])   . Pneumonia    hoispitalized in 08/2011  . Sarcoidosis   . Seasonal allergies   . Sleep apnea    uses CPAP machine     Allergies No Known Allergies  IV Location/Drains/Wounds Patient Lines/Drains/Airways Status   Active Line/Drains/Airways    Name:   Placement date:   Placement time:   Site:   Days:   Peripheral IV 05/08/19 Left Arm   05/08/19    1032    Arm   100   Peripheral IV 08/16/19 Right Antecubital   08/16/19    1447    Antecubital   less than 1   Incision (Closed) 08/09/16 Perineum Other (Comment)   08/09/16    1001     1102          Labs/Imaging Results for orders placed or performed during the hospital encounter of 08/16/19 (from the past 48 hour(s))  Troponin I (High Sensitivity)     Status: None   Collection Time: 08/16/19 12:30 PM  Result Value Ref Range   Troponin I (High Sensitivity) 6 <18 ng/L    Comment: (NOTE) Elevated high sensitivity troponin I (hsTnI) values and significant  changes across serial measurements may suggest ACS but many other  chronic and acute conditions are known to elevate hsTnI results.  Refer to the "Links" section for chest pain algorithms and additional  guidance. Performed at [facility], [place] [address], [place], [place] [address]   Brain natriuretic peptide     Status: None   Collection Time: 08/16/19 12:30 PM  Result Value Ref Range   B Natriuretic Peptide 40.6 0.0 - 100.0 pg/mL    Comment: Performed at [facility], [place] [address] [place], [place] [address]  CBC with Differential     Status: Abnormal   Collection Time: 08/16/19 12:30 PM  Result Value Ref Range   WBC 8.0 4.0 - 10.5 K/uL   RBC 4.47  3.87 - 5.11 MIL/uL   Hemoglobin 10.8 (L) 12.0 - 15.0 g/dL   HCT 37.7 36.0 - 46.0 %   MCV 84.3 80.0 - 100.0 fL   MCH 24.2 (L) 26.0 - 34.0 pg   MCHC 28.6 (L) 30.0 - 36.0 g/dL   RDW 18.3 (H) 11.5 - 15.5 %   Platelets 183 150 - 400 K/uL   nRBC 0.0 0.0 - 0.2 %   Neutrophils Relative % 73 %   Neutro Abs 5.8 1.7 - 7.7 K/uL   Lymphocytes Relative 14 %   Lymphs Abs 1.1 0.7 - 4.0 K/uL   Monocytes Relative 8 %   Monocytes Absolute 0.7 0.1 - 1.0 K/uL   Eosinophils Relative 4 %   Eosinophils Absolute 0.3 0.0 - 0.5 K/uL   Basophils Relative 0 %   Basophils Absolute 0.0 0.0 - 0.1 K/uL   Immature Granulocytes 1 %   Abs Immature Granulocytes 0.05 0.00 - 0.07 K/uL    Comment: Performed at [facility], [place] [address], [place], [place] [address]  Comprehensive metabolic panel     Status: Abnormal   Collection Time: 08/16/19 12:30 PM  Result Value Ref Range   Sodium 140 135 - 145 mmol/L   Potassium 3.9 3.5 - 5.1 mmol/L   Chloride 101 98 - 111 mmol/L   CO2 29 22 - 32 mmol/L   Glucose, Bld 121 (H) 70 - 99 mg/dL   BUN 14 6 - 20 mg/dL   Creatinine, Ser 0.70 0.44 - 1.00 mg/dL   Calcium 8.9 8.9 - 10.3 mg/dL   Total Protein 7.4 6.5 - 8.1 g/dL   Albumin 3.9 3.5 - 5.0 g/dL   AST 15 15 - 41 U/L   ALT 15 0 - 44 U/L   Alkaline Phosphatase 44 38 - 126 U/L   Total Bilirubin 0.8 0.3 - 1.2 mg/dL   GFR calc non Af Amer >60 >60 mL/min   GFR calc Af Amer >60 >60 mL/min   Anion gap 10 5 - 15    Comment: Performed at [facility], [place] [address], [place], [place] [address]  Troponin I (High Sensitivity)     Status: None   Collection Time: 08/16/19  2:47 PM  Result Value Ref Range   Troponin I (High Sensitivity) 7 <18 ng/L    Comment: (NOTE) Elevated high sensitivity troponin I (hsTnI) values and significant  changes across serial measurements may suggest ACS but many other  chronic and acute conditions are known to elevate hsTnI results.  Refer to the "Links" section  for chest pain algorithms and additional  guidance. Performed at [facility], [place] [address], [place],  [address]   Urinalysis, Routine w  reflex microscopic     Status: Abnormal   Collection Time: 08/16/19  7:03 PM  Result Value Ref Range   Color, Urine STRAW (A) YELLOW   APPearance CLEAR CLEAR   Specific Gravity, Urine 1.015 1.005 - 1.030   pH 6.0 5.0 - 8.0   Glucose, UA NEGATIVE NEGATIVE mg/dL   Hgb urine dipstick LARGE (A) NEGATIVE   Bilirubin Urine NEGATIVE NEGATIVE   Ketones, ur NEGATIVE NEGATIVE mg/dL   Protein, ur NEGATIVE NEGATIVE mg/dL   Nitrite NEGATIVE NEGATIVE   Leukocytes,Ua NEGATIVE NEGATIVE   RBC / HPF 11-20 0 - 5 RBC/hpf   WBC, UA 0-5 0 - 5 WBC/hpf   Bacteria, UA NONE SEEN NONE SEEN   Squamous Epithelial / LPF 0-5 0 - 5   Mucus PRESENT     Comment: Performed at [facility], [place] [address] [place], [place] [address]   Dg Chest 2 View  Result Date: 08/16/2019 CLINICAL DATA:  Morbid obesity.  Shortness of breath and weakness. EXAM: CHEST - 2 VIEW COMPARISON:  October 26, 2018 FINDINGS: The study is limited due to patient body habitus. Stable cardiomegaly. The hila and mediastinum are normal. No pneumothorax. No pulmonary nodules, masses, or focal infiltrates identified. IMPRESSION: Limited study due to patient body habitus. No definite abnormalities. Electronically Signed   By: Dorise Bullion III M.D   On: 08/16/2019 13:08   Ct Angio Chest Pe W/cm &/or Wo Cm  Result Date: 08/16/2019 CLINICAL DATA:  Shortness of breath for 3 weeks EXAM: CT ANGIOGRAPHY CHEST WITH CONTRAST TECHNIQUE: Multidetector CT imaging of the chest was performed using the standard protocol during bolus administration of intravenous contrast. Multiplanar CT image reconstructions and MIPs were obtained to evaluate the vascular anatomy. CONTRAST:  143mL OMNIPAQUE IOHEXOL 350 MG/ML SOLN COMPARISON:  05/10/2019 FINDINGS: Cardiovascular: Stable  cardiomegaly. No pericardial effusion. Examination is limited by contrast bolus timing and beam hardening artifact from patient body habitus. No central filling defect to suggest pulmonary embolism. There is cephalization of the pulmonary vasculature. Thoracic aorta normal in course and caliber. Mediastinum/Nodes: Mildly enlarged right paratracheal lymph node measures 11 mm short axis (series 6, image 30), not significantly changed from prior studies. No axillary or hilar lymphadenopathy. Enlarged thyroid gland, unchanged. Trachea and esophagus within normal limits. Lungs/Pleura: There is ill-defined ground-glass opacity in a predominantly perihilar distribution bilaterally. No focal airspace consolidation. No pleural effusion. No pneumothorax. Upper Abdomen: No acute findings. Musculoskeletal: No acute findings. Review of the MIP images confirms the above findings. IMPRESSION: 1. Limited exam.  No large central pulmonary embolus. 2. Cardiomegaly and pulmonary vascular congestion with ill-defined ground-glass opacity in a predominantly perihilar distribution. Findings are nonspecific but can be seen in the setting of pulmonary edema versus atypical infection in the appropriate clinical setting. Electronically Signed   By: Davina Poke M.D.   On: 08/16/2019 17:33    Pending Labs Unresulted Labs (From admission, onward)    Start     Ordered   08/16/19 1745  SARS CORONAVIRUS 2 (TAT 6-24 HRS) Nasopharyngeal Nasopharyngeal Swab  (Symptomatic/High Risk of Exposure/Tier 1 Patients Labs with Precautions)  Once,   STAT    Question Answer Comment  Is this test for diagnosis or screening Diagnosis of ill patient   Symptomatic for COVID-19 as defined by CDC Yes   Date of Symptom Onset 08/02/2019   Hospitalized for COVID-19 Yes   Admitted to ICU for COVID-19 No   Previously tested for COVID-19 Yes   Resident in a  congregate (group) care setting No   Employed in healthcare setting No   Pregnant No       08/16/19 1747   Signed and Held  Magnesium  Tomorrow morning,   R    Comments: Call MD if <1.5    Signed and Held   Signed and Held  Phosphorus  Tomorrow morning,   R     Signed and Held   Signed and Held  TSH  Once,   R    Comments: Cancel if already done within 1 month and notify MD    Signed and Held   Signed and Held  Comprehensive metabolic panel  Once,   R    Comments: Cal MD for K<3.5 or >5.0    Signed and Held   Signed and Held  CBC  Once,   R    Comments: Call for hg <8.0    Signed and Held          Vitals/Pain Today's Vitals   08/16/19 1727 08/16/19 1807 08/16/19 1845 08/16/19 2022  BP: (!) 182/77 (!) 169/95 (!) 183/74 (!) 152/80  Pulse: 92 85 83 91  Resp: (!) 22 (!) 29 19 (!) 26  Temp:      SpO2: 96% 99% 98% 96%  PainSc:        Isolation Precautions Airborne and Contact precautions  Medications Medications  sodium chloride (PF) 0.9 % injection (has no administration in time range)  fentaNYL (SUBLIMAZE) injection 50 mcg (50 mcg Intravenous Given 08/16/19 1448)  iohexol (OMNIPAQUE) 350 MG/ML injection 100 mL (100 mLs Intravenous Contrast Given 08/16/19 1656)  furosemide (LASIX) injection 80 mg (80 mg Intravenous Given 08/16/19 1808)  potassium chloride SA (KLOR-CON) CR tablet 40 mEq (40 mEq Oral Given 08/16/19 1808)    Mobility walks

## 2019-08-17 ENCOUNTER — Observation Stay (HOSPITAL_COMMUNITY): Payer: Medicare HMO

## 2019-08-17 ENCOUNTER — Other Ambulatory Visit: Payer: Self-pay

## 2019-08-17 DIAGNOSIS — J9611 Chronic respiratory failure with hypoxia: Secondary | ICD-10-CM | POA: Diagnosis not present

## 2019-08-17 DIAGNOSIS — Z20828 Contact with and (suspected) exposure to other viral communicable diseases: Secondary | ICD-10-CM | POA: Diagnosis not present

## 2019-08-17 DIAGNOSIS — E785 Hyperlipidemia, unspecified: Secondary | ICD-10-CM | POA: Diagnosis present

## 2019-08-17 DIAGNOSIS — I110 Hypertensive heart disease with heart failure: Secondary | ICD-10-CM | POA: Diagnosis not present

## 2019-08-17 DIAGNOSIS — K219 Gastro-esophageal reflux disease without esophagitis: Secondary | ICD-10-CM | POA: Diagnosis present

## 2019-08-17 DIAGNOSIS — J449 Chronic obstructive pulmonary disease, unspecified: Secondary | ICD-10-CM | POA: Diagnosis present

## 2019-08-17 DIAGNOSIS — I509 Heart failure, unspecified: Secondary | ICD-10-CM | POA: Diagnosis not present

## 2019-08-17 DIAGNOSIS — Z6841 Body Mass Index (BMI) 40.0 and over, adult: Secondary | ICD-10-CM | POA: Diagnosis not present

## 2019-08-17 DIAGNOSIS — K449 Diaphragmatic hernia without obstruction or gangrene: Secondary | ICD-10-CM | POA: Diagnosis present

## 2019-08-17 DIAGNOSIS — R0789 Other chest pain: Secondary | ICD-10-CM | POA: Diagnosis present

## 2019-08-17 DIAGNOSIS — D869 Sarcoidosis, unspecified: Secondary | ICD-10-CM | POA: Diagnosis present

## 2019-08-17 DIAGNOSIS — Z23 Encounter for immunization: Secondary | ICD-10-CM | POA: Diagnosis not present

## 2019-08-17 DIAGNOSIS — R69 Illness, unspecified: Secondary | ICD-10-CM | POA: Diagnosis not present

## 2019-08-17 DIAGNOSIS — D500 Iron deficiency anemia secondary to blood loss (chronic): Secondary | ICD-10-CM | POA: Diagnosis not present

## 2019-08-17 DIAGNOSIS — M170 Bilateral primary osteoarthritis of knee: Secondary | ICD-10-CM | POA: Diagnosis not present

## 2019-08-17 DIAGNOSIS — E039 Hypothyroidism, unspecified: Secondary | ICD-10-CM | POA: Diagnosis present

## 2019-08-17 DIAGNOSIS — E662 Morbid (severe) obesity with alveolar hypoventilation: Secondary | ICD-10-CM | POA: Diagnosis not present

## 2019-08-17 DIAGNOSIS — I5033 Acute on chronic diastolic (congestive) heart failure: Secondary | ICD-10-CM | POA: Diagnosis not present

## 2019-08-17 DIAGNOSIS — N179 Acute kidney failure, unspecified: Secondary | ICD-10-CM | POA: Diagnosis not present

## 2019-08-17 DIAGNOSIS — I272 Pulmonary hypertension, unspecified: Secondary | ICD-10-CM | POA: Diagnosis present

## 2019-08-17 DIAGNOSIS — Z9049 Acquired absence of other specified parts of digestive tract: Secondary | ICD-10-CM | POA: Diagnosis not present

## 2019-08-17 DIAGNOSIS — Z7952 Long term (current) use of systemic steroids: Secondary | ICD-10-CM | POA: Diagnosis not present

## 2019-08-17 DIAGNOSIS — F419 Anxiety disorder, unspecified: Secondary | ICD-10-CM | POA: Diagnosis present

## 2019-08-17 DIAGNOSIS — Z91128 Patient's intentional underdosing of medication regimen for other reason: Secondary | ICD-10-CM | POA: Diagnosis not present

## 2019-08-17 DIAGNOSIS — J302 Other seasonal allergic rhinitis: Secondary | ICD-10-CM | POA: Diagnosis present

## 2019-08-17 DIAGNOSIS — Z9981 Dependence on supplemental oxygen: Secondary | ICD-10-CM | POA: Diagnosis not present

## 2019-08-17 LAB — CBC
HCT: 37.3 % (ref 36.0–46.0)
Hemoglobin: 10.7 g/dL — ABNORMAL LOW (ref 12.0–15.0)
MCH: 24 pg — ABNORMAL LOW (ref 26.0–34.0)
MCHC: 28.7 g/dL — ABNORMAL LOW (ref 30.0–36.0)
MCV: 83.6 fL (ref 80.0–100.0)
Platelets: 191 10*3/uL (ref 150–400)
RBC: 4.46 MIL/uL (ref 3.87–5.11)
RDW: 18.6 % — ABNORMAL HIGH (ref 11.5–15.5)
WBC: 10.4 10*3/uL (ref 4.0–10.5)
nRBC: 0 % (ref 0.0–0.2)

## 2019-08-17 LAB — COMPREHENSIVE METABOLIC PANEL
ALT: 14 U/L (ref 0–44)
AST: 13 U/L — ABNORMAL LOW (ref 15–41)
Albumin: 3.9 g/dL (ref 3.5–5.0)
Alkaline Phosphatase: 44 U/L (ref 38–126)
Anion gap: 9 (ref 5–15)
BUN: 14 mg/dL (ref 6–20)
CO2: 32 mmol/L (ref 22–32)
Calcium: 8.9 mg/dL (ref 8.9–10.3)
Chloride: 97 mmol/L — ABNORMAL LOW (ref 98–111)
Creatinine, Ser: 0.73 mg/dL (ref 0.44–1.00)
GFR calc Af Amer: >60 mL/min (ref >60)
GFR calc non Af Amer: >60 mL/min (ref >60)
Glucose, Bld: 156 mg/dL — ABNORMAL HIGH (ref 70–99)
Potassium: 4.5 mmol/L (ref 3.5–5.1)
Sodium: 138 mmol/L (ref 135–145)
Total Bilirubin: 1.4 mg/dL — ABNORMAL HIGH (ref 0.3–1.2)
Total Protein: 7.2 g/dL (ref 6.5–8.1)

## 2019-08-17 LAB — GLUCOSE, CAPILLARY: Glucose-Capillary: 117 mg/dL — ABNORMAL HIGH (ref 70–99)

## 2019-08-17 LAB — SARS CORONAVIRUS 2 (TAT 6-24 HRS): SARS Coronavirus 2: NEGATIVE

## 2019-08-17 LAB — TSH: TSH: 3.082 u[IU]/mL (ref 0.350–4.500)

## 2019-08-17 LAB — ECHOCARDIOGRAM COMPLETE
Height: 65 in
Weight: 8198.4 oz

## 2019-08-17 LAB — PHOSPHORUS: Phosphorus: 3.8 mg/dL (ref 2.5–4.6)

## 2019-08-17 LAB — MAGNESIUM: Magnesium: 1.6 mg/dL — ABNORMAL LOW (ref 1.7–2.4)

## 2019-08-17 MED ORDER — FUROSEMIDE 10 MG/ML IJ SOLN
60.0000 mg | Freq: Two times a day (BID) | INTRAMUSCULAR | Status: DC
  Administered 2019-08-17 – 2019-08-18 (×2): 60 mg via INTRAVENOUS
  Filled 2019-08-17 (×2): qty 6

## 2019-08-17 MED ORDER — PNEUMOCOCCAL VAC POLYVALENT 25 MCG/0.5ML IJ INJ
0.5000 mL | INJECTION | INTRAMUSCULAR | Status: AC
  Administered 2019-08-19: 0.5 mL via INTRAMUSCULAR
  Filled 2019-08-17 (×2): qty 0.5

## 2019-08-17 MED ORDER — INFLUENZA VAC SPLIT QUAD 0.5 ML IM SUSY
0.5000 mL | PREFILLED_SYRINGE | INTRAMUSCULAR | Status: AC
  Administered 2019-08-19: 0.5 mL via INTRAMUSCULAR
  Filled 2019-08-17: qty 0.5

## 2019-08-17 NOTE — Evaluation (Signed)
Physical Therapy Evaluation Patient Details Name: [name] MRN: [id number] DOB: 1966/04/23 Today's Date: 08/17/2019   History of Present Illness  53 year old morbidly obese female (BMI of 70) with history of diastolic CHF, COPD, chronic respiratory failure on 2 L via nasal cannula, GERD, hypertension, hypothyroidism, history of sarcoidosis, OSA, anemia, hyperlipidemia and iron deficiency anemia presented with 3 weeks of increasing shortness of breath with leg swellings and dyspnea on exertion.  Clinical Impression  Patient evaluated by Physical Therapy with no further acute PT needs identified. All education has been completed and the patient has no further questions.  Pt at baseline level of function. She declines need for further intervention or incr activity, states she just needs to get her L knee injected to assist with pain, declines walker as noted below. Pt is mobilizing in room on her own per her report, encouraged her to continue amb to bathroom (with assist as needed)  See below for any follow-up Physical Therapy or equipment needs. PT is signing off. Thank you for this referral.     Follow Up Recommendations No PT follow up    Equipment Recommendations  None recommended by PT    Recommendations for Other Services       Precautions / Restrictions Precautions Precautions: Fall Precaution Comments: pt denies hx of falls Restrictions Weight Bearing Restrictions: No      Mobility  Bed Mobility Overal bed mobility: Needs Assistance Bed Mobility: Rolling;Sidelying to Sit Rolling: Min assist Sidelying to sit: Mod assist       General bed mobility comments: pt in chair on arrival and departure  Transfers Overall transfer level: Modified independent   Transfers: Sit to/from Stand Sit to Stand: Supervision Stand pivot transfers: Min assist       General transfer comment: for safety  Ambulation/Gait Ambulation/Gait assistance: Supervision Gait  Distance (Feet): 20 Feet(in room) Assistive device: None Gait Pattern/deviations: Wide base of support;Trendelenburg;Step-through pattern;Decreased weight shift to left     General Gait Details: pt briefly attempts to furniture walk, refuses use of RW to un-weight L knee/assist with pain. pt declines amb outside of room; on 2L [place] O2, sats 86% with activity--pt reports she "drops" at her baseline  Stairs            Wheelchair Mobility    Modified Rankin (Stroke Patients Only)       Balance Overall balance assessment: Mild deficits observed, not formally tested                           High level balance activites: Turns;Head turns High Level Balance Comments: no LOB with above             Pertinent Vitals/Pain Pain Assessment: 0-10 Pain Score: 7  Pain Location: L knee Pain Descriptors / Indicators: Constant Pain Intervention(s): Limited activity within patient's tolerance;Monitored during session;Repositioned    Home Living Family/patient expects to be discharged to:: Private residence Living Arrangements: Children Available Help at Discharge: Available PRN/intermittently Type of Home: House Home Access: Level entry     Home Layout: One level Home Equipment: Environmental consultant - 2 wheels;Tub bench      Prior Function Level of Independence: Independent with assistive device(s);Independent         Comments: pt has walker but does not like to use it despite knee pain (chronic)     Hand Dominance        Extremity/Trunk Assessment   Upper Extremity Assessment Upper Extremity Assessment:  Defer to OT evaluation;Overall [facility] for tasks assessed    Lower Extremity Assessment Lower Extremity Assessment: Overall WFL for tasks assessed(ROM limited by body habitus)       Communication   Communication: No difficulties  Cognition Arousal/Alertness: Awake/alert Behavior During Therapy: WFL for tasks assessed/performed Overall Cognitive Status: Within  Functional Limits for tasks assessed                                        General Comments      Exercises     Assessment/Plan    PT Assessment Patent does not need any further PT services  PT Problem List         PT Treatment Interventions      PT Goals (Current goals can be found in the Care Plan section)  Acute Rehab PT Goals Patient Stated Goal: home PT Goal Formulation: All assessment and education complete, DC therapy    Frequency     Barriers to discharge        Co-evaluation PT/OT/SLP Co-Evaluation/Treatment: Yes Reason for Co-Treatment: To address functional/ADL transfers PT goals addressed during session: Mobility/safety with mobility         AM-PAC PT "6 Clicks" Mobility  Outcome Measure Help needed turning from your back to your side while in a flat bed without using bedrails?: None Help needed moving from lying on your back to sitting on the side of a flat bed without using bedrails?: None Help needed moving to and from a bed to a chair (including a wheelchair)?: None Help needed standing up from a chair using your arms (e.g., wheelchair or bedside chair)?: None Help needed to walk in hospital room?: A Little Help needed climbing 3-5 steps with a railing? : A Little 6 Click Score: 22    End of Session   Activity Tolerance: Patient tolerated treatment well Patient left: in chair;with call bell/phone within reach   PT Visit Diagnosis: Other abnormalities of gait and mobility (R26.89)    Time: 1352-1410 PT Time Calculation (min) (ACUTE ONLY): 18 min   Charges:   PT Evaluation $PT Eval Low Complexity: 1 Low          Kenyon Ana, PT  Pager: [phone] Acute Rehab Dept [facility]): [phone]   08/17/2019   [facility] 08/17/2019, 5:03 PM

## 2019-08-17 NOTE — Progress Notes (Signed)
  Echocardiogram 2D Echocardiogram has been performed.  [name] 08/17/2019, 11:17 AM

## 2019-08-17 NOTE — Evaluation (Signed)
Occupational Therapy Evaluation Patient Details Name: [name] MRN: [id number] DOB: 11-25-1965 Today's Date: 08/17/2019    History of Present Illness 53 year old morbidly obese female (BMI of 21) with history of diastolic CHF, COPD, chronic respiratory failure on 2 L via nasal cannula, GERD, hypertension, hypothyroidism, history of sarcoidosis, OSA, anemia, hyperlipidemia and iron deficiency anemia presented with 3 weeks of increasing shortness of breath with leg swellings and dyspnea on exertion.  Also reports orthopnea.  She reports that for the same duration she was having pain in her knees and started taking Lasix only once a day (since she would have to get up to go to the bathroom during the night that would worsen her knee pain).  She was hospitalized in July for CHF exacerbation when she was diuresed with IV Lasix but upon discharge Lasix dose was reduced to 40 mg twice a day as she appeared to be quite dry.  She has not been able to schedule an appointment with her cardiologist since the discharge.  Patient also noticed that she was gaining weight for the past 3 weeks.   Clinical Impression   OT eval and education complete.  Pt does not feel she needs OT.  She has a walker and a sock aid for times she feel she needs them.     Follow Up Recommendations  Supervision - Intermittent    Equipment Recommendations  None recommended by OT    Recommendations for Other Services       Precautions / Restrictions Precautions Precaution Comments: pt reports she has never falled      Mobility Bed Mobility Overal bed mobility: Needs Assistance Bed Mobility: Rolling;Sidelying to Sit Rolling: Min assist Sidelying to sit: Mod assist          Transfers Overall transfer level: Needs assistance   Transfers: Sit to/from Stand;Stand Pivot Transfers Sit to Stand: Min assist Stand pivot transfers: Min assist            Balance Overall balance assessment: Mild deficits  observed, not formally tested                                         ADL either performed or assessed with clinical judgement   ADL Overall ADL's : Modified independent                                       General ADL Comments: Pt reports she is mod I with all ADL activity . Did observe pt up in room and she demostrated safe technique.  Pt stated she was mod I with toileting, LB dressing     Vision Patient Visual Report: No change from baseline       Perception     Praxis      Pertinent Vitals/Pain Pain Assessment: 0-10 Pain Score: 7  Pain Location: l knee Pain Descriptors / Indicators: Contraction Pain Intervention(s): Limited activity within patient's tolerance;Monitored during session;Repositioned     Hand Dominance     Extremity/Trunk Assessment Upper Extremity Assessment Upper Extremity Assessment: Overall WFL for tasks assessed           Communication Communication Communication: No difficulties   Cognition Arousal/Alertness: Awake/alert Behavior During Therapy: WFL for tasks assessed/performed Overall Cognitive Status: Within Functional Limits for tasks assessed  Home Living Family/patient expects to be discharged to:: Private residence Living Arrangements: Children   Type of Home: House Home Access: Level entry     [place]: One level     Bathroom Shower/Tub: Teacher, early years/pre: [place]: Environmental consultant - 2 wheels;Tub bench          Prior Functioning/Environment Level of Independence: Independent with assistive device(s)                          OT Goals(Current goals can be found in the care plan section) Acute Rehab OT Goals Patient Stated Goal: home  OT Frequency:                AM-PAC OT "6 Clicks" Daily Activity     Outcome Measure Help from another person eating meals?: None Help from  another person taking care of personal grooming?: None Help from another person toileting, which includes using toliet, bedpan, or urinal?: None Help from another person bathing (including washing, rinsing, drying)?: None Help from another person to put on and taking off regular upper body clothing?: None Help from another person to put on and taking off regular lower body clothing?: None 6 Click Score: 24   End of Session Nurse Communication: Mobility status  Activity Tolerance: Patient tolerated treatment well Patient left: in chair                   Time: 0152-0207 OT Time Calculation (min): 15 min Charges:  OT General Charges $OT Visit: 1 Visit OT Evaluation $OT Eval Moderate Complexity: 1 Mod  Kari Baars, OT Acute Rehabilitation Services [phone] Office- [phone]     Lutricia Widjaja, Edwena Felty D 08/17/2019, 2:53 PM

## 2019-08-17 NOTE — Progress Notes (Addendum)
PROGRESS NOTE                                                                                                                                                                                                             Patient Demographics:    [name], is a 53 y.o. female, DOB - [birth year], CI:[id number]  Admit date - 08/16/2019   Admitting Physician Toy Baker, MD  Outpatient Primary MD for the patient is [name], NP  LOS - 0  Outpatient Specialists: Dr. [name] (cardiology)  Chief Complaint  Patient presents with  . Shortness of Breath       Brief Narrative 53 year old morbidly obese female (BMI of 21) with history of diastolic CHF, COPD, chronic respiratory failure on 2 L via nasal cannula, GERD, hypertension, hypothyroidism, history of sarcoidosis, OSA, anemia, hyperlipidemia and iron deficiency anemia presented with 3 weeks of increasing shortness of breath with leg swellings and dyspnea on exertion.  Also reports orthopnea.  She reports that for the same duration she was having pain in her knees and started taking Lasix only once a day (since she would have to get up to go to the bathroom during the night that would worsen her knee pain).  She was hospitalized in July for CHF exacerbation when she was diuresed with IV Lasix but upon discharge Lasix dose was reduced to 40 mg twice a day as she appeared to be quite dry.  She has not been able to schedule an appointment with her cardiologist since the discharge.  Patient also noticed that she was gaining weight for the past 3 weeks.  Patient admitted for acute on chronic diastolic CHF.  On review of the chart patient had gained about 14 pounds (was 226 kg) upon discharge from the hospital back in July.   Subjective:   Reports her breathing to be slightly better since yesterday.  Assessment  & Plan :   Principal problem Acute on chronic  diastolic CHF ([place]) Placed on IV Lasix 40 mg every 12 hours.  I will increase the dose to 60 mg every 12 hours.  Monitor strict I's/O and daily weight.  Will consult cardiology if no improvement with current diuresis or worsening symptoms.  Will arrange for outpatient follow-up with her cardiologist within 2 weeks of discharge.  2D echo ordered.  Placed on ACE  inhibitor. Continue telemetry monitoring.  Monitor electrolytes and renal function daily.    Active Problems: Chest pain Likely secondary to fluid overload.  No further symptoms.  Stable on telemetry    OSA (obstructive sleep apnea) On CPAP at home.  Resume.    Obesity hypoventilation syndrome (HCC) Resume home CPAP  Chronic respiratory failure with hypoxia Likely combination of her CHF and OSA/OHS. Stable on 2 L via nasal cannula.  Essential hypertension Resume home meds.  Stable  Iron deficiency anemia Secondary to chronic blood loss.  Stable.  Being followed by hematology.  History of sarcoidosis Stable.  On methotrexate and chronic steroid.  Continue  Hypothyroidism .  Continue Synthroid  Arthritis of the knee Patient reports being scheduled to get her cortisone injection on the morning of 10/19 with Dr. [name].  Since patient will likely not be discharged before 10/19 we will ask him if she can get the injection while she is here or reschedule her appointment.  Morbid obesity (BMI 85 kg/m) Nutrition consulted  Patient status: Inpatient Patient presenting with acute on chronic diastolic CHF with acute severe exacerbation, significant weight gain, orthopnea and PND.  Patient needs to receive aggressive IV diuresis for at least 48-72 hours and monitor closely for improvement in her respiratory symptoms with high risk for worsening of her CHF symptoms including acute respiratory failure.  Patient needs to be monitored in an inpatient setting for >2 midnight.  Code Status : Full code  Family Communication  : None at  bedside  Disposition Plan  : Home possibly in the next 48-72 hours if diuresed adequately  Barriers For Discharge : Active symptoms  Consults  : None  Procedures  : 2D echo  DVT Prophylaxis  :  Lovenox -  Lab Results  Component Value Date   PLT 191 08/17/2019    Antibiotics  :    Anti-infectives (From admission, onward)   None        Objective:   Vitals:   08/16/19 2139 08/16/19 2201 08/17/19 0008 08/17/19 0446  BP: (!) 176/96  131/70 133/82  Pulse: 89  81 74  Resp: 14  18 18   Temp: 98.3 F (36.8 C)  98.4 F (36.9 C) 98.4 F (36.9 C)  TempSrc: Oral  Oral Oral  SpO2: (!) 86%  95% 97%  Weight:  (!) 233.1 kg  (!) 232.4 kg  Height:  5\' 5"  (1.651 m)      Wt Readings from Last 3 Encounters:  08/17/19 (!) 232.4 kg  07/31/19 (!) 225.9 kg  05/12/19 (!) 226.1 kg     Intake/Output Summary (Last 24 hours) at 08/17/2019 1114 Last data filed at 08/17/2019 0741 Gross per 24 hour  Intake -  Output 800 ml  Net -800 ml     Physical Exam  Gen: Morbidly obese female in no acute distress HEENT: Moist mucosa, supple neck Chest: Diminished breath sounds due to body habitus CVs: Normal S1-S2, no murmurs GI: Soft, nondistended, nontender Musculoskeletal: Warm, 1+ pitting edema bilaterally     Data Review:    CBC Recent Labs  Lab 08/16/19 1230 08/17/19 0429  WBC 8.0 10.4  HGB 10.8* 10.7*  HCT 37.7 37.3  PLT 183 191  MCV 84.3 83.6  MCH 24.2* 24.0*  MCHC 28.6* 28.7*  RDW 18.3* 18.6*  LYMPHSABS 1.1  --   MONOABS 0.7  --   EOSABS 0.3  --   BASOSABS 0.0  --     Chemistries  Recent Labs  Lab 08/16/19  1230 08/17/19 0429  NA 140 138  K 3.9 4.5  CL 101 97*  CO2 29 32  GLUCOSE 121* 156*  BUN 14 14  CREATININE 0.70 0.73  CALCIUM 8.9 8.9  MG  --  1.6*  AST 15 13*  ALT 15 14  ALKPHOS 44 44  BILITOT 0.8 1.4*   ------------------------------------------------------------------------------------------------------------------ No results for input(s):  CHOL, HDL, LDLCALC, TRIG, CHOLHDL, LDLDIRECT in the last 72 hours.  Lab Results  Component Value Date   HGBA1C 5.7 05/08/2019   ------------------------------------------------------------------------------------------------------------------ Recent Labs    08/17/19 0429  TSH 3.082   ------------------------------------------------------------------------------------------------------------------ No results for input(s): VITAMINB12, FOLATE, FERRITIN, TIBC, IRON, RETICCTPCT in the last 72 hours.  Coagulation profile No results for input(s): INR, PROTIME in the last 168 hours.  No results for input(s): DDIMER in the last 72 hours.  Cardiac Enzymes No results for input(s): CKMB, TROPONINI, MYOGLOBIN in the last 168 hours.  Invalid input(s): CK ------------------------------------------------------------------------------------------------------------------    Component Value Date/Time   BNP 40.6 08/16/2019 1230    Inpatient Medications  Scheduled Meds: . atorvastatin  40 mg Oral q1800  . enoxaparin (LOVENOX) injection  100 mg Subcutaneous Daily  . famotidine  40 mg Oral BID  . furosemide  40 mg Intravenous Q12H  . [START ON 08/18/2019] influenza vac split quadrivalent PF  0.5 mL Intramuscular Tomorrow-1000  . losartan  100 mg Oral Daily  . metoprolol succinate  25 mg Oral Daily  . [START ON 08/18/2019] pneumococcal 23 valent vaccine  0.5 mL Intramuscular Tomorrow-1000  . potassium chloride SA  20 mEq Oral Daily  . predniSONE  10 mg Oral Q breakfast  . sodium chloride flush  3 mL Intravenous Q12H   Continuous Infusions: . sodium chloride     PRN Meds:.sodium chloride, acetaminophen **OR** acetaminophen, albuterol, HYDROcodone-acetaminophen, ondansetron **OR** ondansetron (ZOFRAN) IV, sodium chloride flush  Micro Results Recent Results (from the past 240 hour(s))  SARS CORONAVIRUS 2 (TAT 6-24 HRS) Nasopharyngeal Nasopharyngeal Swab     Status: None   Collection Time:  08/16/19  5:45 PM   Specimen: Nasopharyngeal Swab  Result Value Ref Range Status   SARS Coronavirus 2 NEGATIVE NEGATIVE Final    Comment: (NOTE) SARS-CoV-2 target nucleic acids are NOT DETECTED. The SARS-CoV-2 RNA is generally detectable in upper and lower respiratory specimens during the acute phase of infection. Negative results do not preclude SARS-CoV-2 infection, do not rule out co-infections with other pathogens, and should not be used as the sole basis for treatment or other patient management decisions. Negative results must be combined with clinical observations, patient history, and epidemiological information. The expected result is Negative. Fact Sheet for Patients: SugarRoll.be Fact Sheet for Healthcare Providers: https://www.woods-mathews.com/ This test is not yet approved or cleared by the [place] FDA and  has been authorized for detection and/or diagnosis of SARS-CoV-2 by FDA under an Emergency Use Authorization (EUA). This EUA will remain  in effect (meaning this test can be used) for the duration of the COVID-19 declaration under Section 56 4(b)(1) of the Act, 21 U.S.C. section 360bbb-3(b)(1), unless the authorization is terminated or revoked sooner. Performed at [facility], [place] [address] [place], [place] [address]     Radiology Reports Dg Chest 2 View  Result Date: 08/16/2019 CLINICAL DATA:  Morbid obesity.  Shortness of breath and weakness. EXAM: CHEST - 2 VIEW COMPARISON:  October 26, 2018 FINDINGS: The study is limited due to patient body habitus. Stable cardiomegaly. The hila and mediastinum are normal.  No pneumothorax. No pulmonary nodules, masses, or focal infiltrates identified. IMPRESSION: Limited study due to patient body habitus. No definite abnormalities. Electronically Signed   By: Dorise Bullion III M.D   On: 08/16/2019 13:08   Ct Angio Chest Pe W/cm &/or Wo Cm  Result Date:  08/16/2019 CLINICAL DATA:  Shortness of breath for 3 weeks EXAM: CT ANGIOGRAPHY CHEST WITH CONTRAST TECHNIQUE: Multidetector CT imaging of the chest was performed using the standard protocol during bolus administration of intravenous contrast. Multiplanar CT image reconstructions and MIPs were obtained to evaluate the vascular anatomy. CONTRAST:  16mL OMNIPAQUE IOHEXOL 350 MG/ML SOLN COMPARISON:  05/10/2019 FINDINGS: Cardiovascular: Stable cardiomegaly. No pericardial effusion. Examination is limited by contrast bolus timing and beam hardening artifact from patient body habitus. No central filling defect to suggest pulmonary embolism. There is cephalization of the pulmonary vasculature. Thoracic aorta normal in course and caliber. Mediastinum/Nodes: Mildly enlarged right paratracheal lymph node measures 11 mm short axis (series 6, image 30), not significantly changed from prior studies. No axillary or hilar lymphadenopathy. Enlarged thyroid gland, unchanged. Trachea and esophagus within normal limits. Lungs/Pleura: There is ill-defined ground-glass opacity in a predominantly perihilar distribution bilaterally. No focal airspace consolidation. No pleural effusion. No pneumothorax. Upper Abdomen: No acute findings. Musculoskeletal: No acute findings. Review of the MIP images confirms the above findings. IMPRESSION: 1. Limited exam.  No large central pulmonary embolus. 2. Cardiomegaly and pulmonary vascular congestion with ill-defined ground-glass opacity in a predominantly perihilar distribution. Findings are nonspecific but can be seen in the setting of pulmonary edema versus atypical infection in the appropriate clinical setting. Electronically Signed   By: Davina Poke M.D.   On: 08/16/2019 17:33    Time Spent in minutes 35   Daksh Coates M.D on 08/17/2019 at 11:14 AM  Between 7am to 7pm - Pager - [phone]  After 7pm go to [email or web address] - password [facility]  [facility] -  Office   [phone]

## 2019-08-18 DIAGNOSIS — N179 Acute kidney failure, unspecified: Secondary | ICD-10-CM | POA: Diagnosis not present

## 2019-08-18 LAB — BASIC METABOLIC PANEL
Anion gap: 13 (ref 5–15)
BUN: 19 mg/dL (ref 6–20)
CO2: 29 mmol/L (ref 22–32)
Calcium: 8.8 mg/dL — ABNORMAL LOW (ref 8.9–10.3)
Chloride: 97 mmol/L — ABNORMAL LOW (ref 98–111)
Creatinine, Ser: 1.14 mg/dL — ABNORMAL HIGH (ref 0.44–1.00)
GFR calc Af Amer: >60 mL/min (ref >60)
GFR calc non Af Amer: 55 mL/min — ABNORMAL LOW (ref >60)
Glucose, Bld: 122 mg/dL — ABNORMAL HIGH (ref 70–99)
Potassium: 3.9 mmol/L (ref 3.5–5.1)
Sodium: 139 mmol/L (ref 135–145)

## 2019-08-18 MED ORDER — FUROSEMIDE 10 MG/ML IJ SOLN
80.0000 mg | Freq: Two times a day (BID) | INTRAMUSCULAR | Status: DC
  Administered 2019-08-19 – 2019-08-21 (×5): 80 mg via INTRAVENOUS
  Filled 2019-08-18 (×6): qty 8

## 2019-08-18 NOTE — Progress Notes (Signed)
Nutrition Education Note  RD consulted for nutrition education regarding CHF.  **[place]. Pt reports she tries to limit sodium in diet at home (did not go into much detail).   RD provided "Low Sodium Nutrition Therapy" handout from the Academy of [employer] in discharge instructions. Provides examples on ways to decrease sodium intake in diet. Discourages intake of processed foods and use of salt shaker. Encourages fresh fruits and vegetables as well as whole grain sources of carbohydrates to maximize fiber intake.   RD discussed why it is important for patient to adhere to diet recommendations, and emphasized the role of fluids, foods to avoid, and importance of weighing self daily.  Expect fair compliance. Pt with no questions for RD.  Body mass index is 84.57 kg/m. Pt meets criteria for morbid obesity based on current BMI.  Current diet order is 2gmna, patient is consuming approximately 100% of meals at this time. Labs and medications reviewed. No further nutrition interventions warranted at this time. If additional nutrition issues arise, please re-consult RD.   Clayton Bibles, MS, RD, LDN Inpatient Clinical Dietitian Pager: [phone] After Hours Pager: [phone]

## 2019-08-18 NOTE — Discharge Instructions (Signed)

## 2019-08-18 NOTE — Progress Notes (Addendum)
PROGRESS NOTE                                                                                                                                                                                                             Patient Demographics:    [name], is a 53 y.o. female, DOB - 12-30-1965, [id number]  Admit date - 08/16/2019   Admitting Physician Toy Baker, MD  Outpatient Primary MD for the patient is [name], NP  LOS - 1  Outpatient Specialists: Dr. [name] (cardiology)  Chief Complaint  Patient presents with  . Shortness of Breath       Brief Narrative 53 year old morbidly obese female (BMI of 48) with history of diastolic CHF, COPD, chronic respiratory failure on 2 L via nasal cannula, GERD, hypertension, hypothyroidism, history of sarcoidosis, OSA, anemia, hyperlipidemia and iron deficiency anemia presented with 3 weeks of increasing shortness of breath with leg swellings and dyspnea on exertion.  Also reports orthopnea.  She reports that for the same duration she was having pain in her knees and started taking Lasix only once a day (since she would have to get up to go to the bathroom during the night that would worsen her knee pain).  She was hospitalized in July for CHF exacerbation when she was diuresed with IV Lasix but upon discharge Lasix dose was reduced to 40 mg twice a day as she appeared to be quite dry.  She has not been able to schedule an appointment with her cardiologist since the discharge.  Patient also noticed that she was gaining weight for the past 3 weeks.  Patient admitted for acute on chronic diastolic CHF.  On review of the chart patient had gained about 14 pounds (was 226 kg) upon discharge from the hospital back in July.   Subjective:   She put out around 1600 cc yesterday however feels her dyspnea has not improved much.  Assessment  & Plan :   Principal  problem Acute on chronic diastolic CHF (HCC) Increased Lasix dose to 60 mg every 12 hours with moderate diuresis.  Symptoms not much improved.  Will increase dose to 80 mg every 12 hours.  Strict I's/O and daily weight.  If no improvement or worsening renal function will consult cardiology.  Also needs close outpatient follow-up with her cardiologist (has not been seen  since her last discharge). 2D echo with normal EF, LVH and increased pulmonary artery systolic pressure Strict I's/O and daily weight.  Monitor electrolytes.    Active Problems: Chest pain Likely secondary to fluid overload.  No further symptoms.  Stable on telemetry  Acute kidney injury ([place]) Mild secondary to diuresis.  Monitor closely and adjust Lasix dose.    OSA (obstructive sleep apnea) On CPAP at home.  Resume.    Obesity hypoventilation syndrome (HCC) Resume home CPAP  Chronic respiratory failure with hypoxia Likely combination of her CHF and OSA/OHS. Stable on 2 L via nasal cannula.  Essential hypertension Resume home meds.  Stable  Iron deficiency anemia Secondary to chronic blood loss.  Stable.  Being followed by hematology.  History of sarcoidosis Stable.  On methotrexate and chronic steroid.  Continue  Hypothyroidism .  Continue Synthroid  Arthritis of the knee Patient reports being scheduled to get her cortisone injection on the morning of 10/19 with Dr. [name].  She will likely miss her appointment.  Spoke with Dr. [name].  He may see her while in the hospital or arrange outpatient appointment for the next day.  Morbid obesity (BMI 85 kg/m) Nutrition consulted  Code Status : Full code  Family Communication  : None at bedside  Disposition Plan  : Home possibly in the next 48-72 hours if diuresed adequately  Barriers For Discharge : Active symptoms  Consults  : None  Procedures  : 2D echo  DVT Prophylaxis  :  Lovenox -  Lab Results  Component Value Date   PLT 191 08/17/2019     Antibiotics  :    Anti-infectives (From admission, onward)   None        Objective:   Vitals:   08/17/19 1332 08/17/19 2042 08/17/19 2330 08/18/19 0617  BP: (!) 143/79 135/64  127/67  Pulse: 76 80 78 67  Resp: 16 19 18 20   Temp: 98.2 F (36.8 C) 98.7 F (37.1 C)  97.7 F (36.5 C)  TempSrc: Oral Oral  Oral  SpO2: 98% 97% 97% 100%  Weight:    (!) 230.5 kg  Height:        Wt Readings from Last 3 Encounters:  08/18/19 (!) 230.5 kg  07/31/19 (!) 225.9 kg  05/12/19 (!) 226.1 kg     Intake/Output Summary (Last 24 hours) at 08/18/2019 1150 Last data filed at 08/18/2019 1053 Gross per 24 hour  Intake 960 ml  Output 1450 ml  Net -490 ml    Physical exam Morbidly obese female, fatigued, not in acute distress HEENT: Moist mucosa, supple neck Chest: Diminished breath sounds due to body habitus CVs: Normal S1-S2 GI: Soft, nondistended, nontender Musculoskeletal: 1+ pitting edema bilaterally Physical Exam  Gen: Morbidly obese female in no acute distress HEENT: Moist mucosa, supple neck Chest: Diminished breath sounds due to body habitus CVs: Normal S1-S2, no murmurs GI: Soft, nondistended, nontender Musculoskeletal: Warm, 1+ pitting edema bilaterally     Data Review:    CBC Recent Labs  Lab 08/16/19 1230 08/17/19 0429  WBC 8.0 10.4  HGB 10.8* 10.7*  HCT 37.7 37.3  PLT 183 191  MCV 84.3 83.6  MCH 24.2* 24.0*  MCHC 28.6* 28.7*  RDW 18.3* 18.6*  LYMPHSABS 1.1  --   MONOABS 0.7  --   EOSABS 0.3  --   BASOSABS 0.0  --     Chemistries  Recent Labs  Lab 08/16/19 1230 08/17/19 0429 08/18/19 0447  NA 140 138 139  K 3.9 4.5 3.9  CL 101 97* 97*  CO2 29 32 29  GLUCOSE 121* 156* 122*  BUN 14 14 19   CREATININE 0.70 0.73 1.14*  CALCIUM 8.9 8.9 8.8*  MG  --  1.6*  --   AST 15 13*  --   ALT 15 14  --   ALKPHOS 44 44  --   BILITOT 0.8 1.4*  --     ------------------------------------------------------------------------------------------------------------------ No results for input(s): CHOL, HDL, LDLCALC, TRIG, CHOLHDL, LDLDIRECT in the last 72 hours.  Lab Results  Component Value Date   HGBA1C 5.7 05/08/2019   ------------------------------------------------------------------------------------------------------------------ Recent Labs    08/17/19 0429  TSH 3.082   ------------------------------------------------------------------------------------------------------------------ No results for input(s): VITAMINB12, FOLATE, FERRITIN, TIBC, IRON, RETICCTPCT in the last 72 hours.  Coagulation profile No results for input(s): INR, PROTIME in the last 168 hours.  No results for input(s): DDIMER in the last 72 hours.  Cardiac Enzymes No results for input(s): CKMB, TROPONINI, MYOGLOBIN in the last 168 hours.  Invalid input(s): CK ------------------------------------------------------------------------------------------------------------------    Component Value Date/Time   BNP 40.6 08/16/2019 1230    Inpatient Medications  Scheduled Meds: . atorvastatin  40 mg Oral q1800  . enoxaparin (LOVENOX) injection  100 mg Subcutaneous Daily  . famotidine  40 mg Oral BID  . furosemide  80 mg Intravenous Q12H  . influenza vac split quadrivalent PF  0.5 mL Intramuscular Tomorrow-1000  . losartan  100 mg Oral Daily  . metoprolol succinate  25 mg Oral Daily  . pneumococcal 23 valent vaccine  0.5 mL Intramuscular Tomorrow-1000  . potassium chloride SA  20 mEq Oral Daily  . predniSONE  10 mg Oral Q breakfast  . sodium chloride flush  3 mL Intravenous Q12H   Continuous Infusions: . sodium chloride     PRN Meds:.sodium chloride, acetaminophen **OR** acetaminophen, albuterol, HYDROcodone-acetaminophen, ondansetron **OR** ondansetron (ZOFRAN) IV, sodium chloride flush  Micro Results Recent Results (from the past 240 hour(s))  SARS  CORONAVIRUS 2 (TAT 6-24 HRS) Nasopharyngeal Nasopharyngeal Swab     Status: None   Collection Time: 08/16/19  5:45 PM   Specimen: Nasopharyngeal Swab  Result Value Ref Range Status   SARS Coronavirus 2 NEGATIVE NEGATIVE Final    Comment: (NOTE) SARS-CoV-2 target nucleic acids are NOT DETECTED. The SARS-CoV-2 RNA is generally detectable in upper and lower respiratory specimens during the acute phase of infection. Negative results do not preclude SARS-CoV-2 infection, do not rule out co-infections with other pathogens, and should not be used as the sole basis for treatment or other patient management decisions. Negative results must be combined with clinical observations, patient history, and epidemiological information. The expected result is Negative. Fact Sheet for Patients: SugarRoll.be Fact Sheet for Healthcare Providers: https://www.woods-mathews.com/ This test is not yet approved or cleared by the [place] FDA and  has been authorized for detection and/or diagnosis of SARS-CoV-2 by FDA under an Emergency Use Authorization (EUA). This EUA will remain  in effect (meaning this test can be used) for the duration of the COVID-19 declaration under Section 56 4(b)(1) of the Act, 21 U.S.C. section 360bbb-3(b)(1), unless the authorization is terminated or revoked sooner. Performed at [facility], [place] [address], [place], [place] [address]     Radiology Reports Dg Chest 2 View  Result Date: 08/16/2019 CLINICAL DATA:  Morbid obesity.  Shortness of breath and weakness. EXAM: CHEST - 2 VIEW COMPARISON:  October 26, 2018 FINDINGS: The study is limited due to patient body habitus.  Stable cardiomegaly. The hila and mediastinum are normal. No pneumothorax. No pulmonary nodules, masses, or focal infiltrates identified. IMPRESSION: Limited study due to patient body habitus. No definite abnormalities. Electronically Signed   By: Dorise Bullion III M.D   On: 08/16/2019 13:08   Ct Angio Chest Pe W/cm &/or Wo Cm  Result Date: 08/16/2019 CLINICAL DATA:  Shortness of breath for 3 weeks EXAM: CT ANGIOGRAPHY CHEST WITH CONTRAST TECHNIQUE: Multidetector CT imaging of the chest was performed using the standard protocol during bolus administration of intravenous contrast. Multiplanar CT image reconstructions and MIPs were obtained to evaluate the vascular anatomy. CONTRAST:  115mL OMNIPAQUE IOHEXOL 350 MG/ML SOLN COMPARISON:  05/10/2019 FINDINGS: Cardiovascular: Stable cardiomegaly. No pericardial effusion. Examination is limited by contrast bolus timing and beam hardening artifact from patient body habitus. No central filling defect to suggest pulmonary embolism. There is cephalization of the pulmonary vasculature. Thoracic aorta normal in course and caliber. Mediastinum/Nodes: Mildly enlarged right paratracheal lymph node measures 11 mm short axis (series 6, image 30), not significantly changed from prior studies. No axillary or hilar lymphadenopathy. Enlarged thyroid gland, unchanged. Trachea and esophagus within normal limits. Lungs/Pleura: There is ill-defined ground-glass opacity in a predominantly perihilar distribution bilaterally. No focal airspace consolidation. No pleural effusion. No pneumothorax. Upper Abdomen: No acute findings. Musculoskeletal: No acute findings. Review of the MIP images confirms the above findings. IMPRESSION: 1. Limited exam.  No large central pulmonary embolus. 2. Cardiomegaly and pulmonary vascular congestion with ill-defined ground-glass opacity in a predominantly perihilar distribution. Findings are nonspecific but can be seen in the setting of pulmonary edema versus atypical infection in the appropriate clinical setting. Electronically Signed   By: Davina Poke M.D.   On: 08/16/2019 17:33    Time Spent in minutes 35   Shakai Dolley M.D on 08/18/2019 at 11:50 AM  Between 7am to 7pm - Pager -  [phone]  After 7pm go to [email or web address] - password [facility]  [facility] -  Office  [phone]

## 2019-08-18 NOTE — Progress Notes (Signed)
Received handoff from Lorain Childes, RN at start of 7pm shift.

## 2019-08-19 ENCOUNTER — Ambulatory Visit: Payer: Medicare HMO | Admitting: Orthopaedic Surgery

## 2019-08-19 ENCOUNTER — Telehealth: Payer: Self-pay

## 2019-08-19 DIAGNOSIS — I509 Heart failure, unspecified: Secondary | ICD-10-CM

## 2019-08-19 DIAGNOSIS — D500 Iron deficiency anemia secondary to blood loss (chronic): Secondary | ICD-10-CM

## 2019-08-19 LAB — BASIC METABOLIC PANEL
Anion gap: 12 (ref 5–15)
BUN: 28 mg/dL — ABNORMAL HIGH (ref 6–20)
CO2: 29 mmol/L (ref 22–32)
Calcium: 8.7 mg/dL — ABNORMAL LOW (ref 8.9–10.3)
Chloride: 97 mmol/L — ABNORMAL LOW (ref 98–111)
Creatinine, Ser: 1.4 mg/dL — ABNORMAL HIGH (ref 0.44–1.00)
GFR calc Af Amer: 50 mL/min — ABNORMAL LOW (ref >60)
GFR calc non Af Amer: 43 mL/min — ABNORMAL LOW (ref >60)
Glucose, Bld: 119 mg/dL — ABNORMAL HIGH (ref 70–99)
Potassium: 4.5 mmol/L (ref 3.5–5.1)
Sodium: 138 mmol/L (ref 135–145)

## 2019-08-19 NOTE — Telephone Encounter (Signed)
Will do, and I will see her at [facility], thanks    Truitt Merle MD

## 2019-08-19 NOTE — Progress Notes (Signed)
PROGRESS NOTE                                                                                                                                                                                                             Patient Demographics:    [name], is a 53 y.o. female, DOB - 04/10/1966, [id number]  Admit date - 08/16/2019   Admitting Physician Toy Baker, MD  Outpatient Primary MD for the patient is [name], NP  LOS - 2  Outpatient Specialists: Dr. [name] (cardiology)  Chief Complaint  Patient presents with  . Shortness of Breath       Brief Narrative 53 year old morbidly obese female (BMI of 39) with history of diastolic CHF, COPD, chronic respiratory failure on 2 L via nasal cannula, GERD, hypertension, hypothyroidism, history of sarcoidosis, OSA, anemia, hyperlipidemia and iron deficiency anemia presented with 3 weeks of increasing shortness of breath with leg swellings and dyspnea on exertion.  Also reports orthopnea.  She reports that for the same duration she was having pain in her knees and started taking Lasix only once a day (since she would have to get up to go to the bathroom during the night that would worsen her knee pain).  She was hospitalized in July for CHF exacerbation when she was diuresed with IV Lasix but upon discharge Lasix dose was reduced to 40 mg twice a day as she appeared to be quite dry.  She has not been able to schedule an appointment with her cardiologist since the discharge.  Patient also noticed that she was gaining weight for the past 3 weeks.  Patient admitted for acute on chronic diastolic CHF.  On review of the chart patient had gained about 14 pounds (was 226 kg) upon discharge from the hospital back in July.   Subjective:   Made about 3.75 L with negative balance of 3 L past 24 hours.  Reports of breathing still not much improved  Assessment  & Plan :    Principal problem Acute on chronic diastolic CHF (HCC) Good diuresis with IV Lasix 80 mg every 12 hours.  I will continue the same today.  If no improvement in her respiratory symptoms will consult cardiology tomorrow.  I suspect her morbid obesity, OHS and mild pulmonary hypertension are also contributing. Continue strict I's/O and daily weight.  Active Problems: Chest pain Secondary to fluid overload, resolved.  Acute kidney injury ([employer]) Mild, secondary to diuresis.  Continue to monitor    OSA (obstructive sleep apnea) Continue CPAP follow-up    Obesity hypoventilation syndrome (HCC) Resume home CPAP  Chronic respiratory failure with hypoxia Likely combination of her CHF and OSA/OHS. Stable on 2 L via nasal cannula.  Essential hypertension Resume home meds.  Stable  Iron deficiency anemia Secondary to chronic blood loss.  Stable.  Being followed by hematology.  History of sarcoidosis Stable.  On methotrexate and chronic steroid.  Continue  Hypothyroidism .  Continue Synthroid  Arthritis of the knee 3 monthly steroid injection with Dr. [name].  Patient has been having worsening bilateral knee pain for several weeks.  She was scheduled to see him in the office this morning for steroid injections.  Appreciate him coming by and giving her steroid injections at bedside.  Morbid obesity (BMI 85 kg/m) Nutrition consulted  Code Status : Full code  Family Communication  : None at bedside  Disposition Plan  : Home possibly in the next 48-72 hours if symptoms improved with adequate diuresis.  Barriers For Discharge : Active symptoms  Consults  : Dr. [name] (orthopedics)  Procedures  : 2D echo  DVT Prophylaxis  :  Lovenox -  Lab Results  Component Value Date   PLT 191 08/17/2019    Antibiotics  :    Anti-infectives (From admission, onward)   None        Objective:   Vitals:   08/18/19 2051 08/18/19 2102 08/19/19 0544 08/19/19 1310  BP: 120/72   (!) 125/54 113/75  Pulse: 88  73 85  Resp: 18  18 17   Temp: 98.5 F (36.9 C)  97.9 F (36.6 C) 97.8 F (36.6 C)  TempSrc: Oral  Oral Oral  SpO2: 96% 95% 100% 99%  Weight:   (!) 228.5 kg   Height:        Wt Readings from Last 3 Encounters:  08/19/19 (!) 228.5 kg  07/31/19 (!) 225.9 kg  05/12/19 (!) 226.1 kg     Intake/Output Summary (Last 24 hours) at 08/19/2019 1320 Last data filed at 08/19/2019 1000 Gross per 24 hour  Intake 360 ml  Output 2800 ml  Net -2440 ml    Physical exam Morbidly obese female, not in distress HEENT: Moist mucosa, supple neck Chest: Clear CVs: Normal S1-S2 GI: Soft, nondistended, nontender Musculoskeletal: Trace edema bilaterally      Data Review:    CBC Recent Labs  Lab 08/16/19 1230 08/17/19 0429  WBC 8.0 10.4  HGB 10.8* 10.7*  HCT 37.7 37.3  PLT 183 191  MCV 84.3 83.6  MCH 24.2* 24.0*  MCHC 28.6* 28.7*  RDW 18.3* 18.6*  LYMPHSABS 1.1  --   MONOABS 0.7  --   EOSABS 0.3  --   BASOSABS 0.0  --     Chemistries  Recent Labs  Lab 08/16/19 1230 08/17/19 0429 08/18/19 0447 08/19/19 0336  NA 140 138 139 138  K 3.9 4.5 3.9 4.5  CL 101 97* 97* 97*  CO2 29 32 29 29  GLUCOSE 121* 156* 122* 119*  BUN 14 14 19  28*  CREATININE 0.70 0.73 1.14* 1.40*  CALCIUM 8.9 8.9 8.8* 8.7*  MG  --  1.6*  --   --   AST 15 13*  --   --   ALT 15 14  --   --   ALKPHOS 44 44  --   --  BILITOT 0.8 1.4*  --   --    ------------------------------------------------------------------------------------------------------------------ No results for input(s): CHOL, HDL, LDLCALC, TRIG, CHOLHDL, LDLDIRECT in the last 72 hours.  Lab Results  Component Value Date   HGBA1C 5.7 05/08/2019   ------------------------------------------------------------------------------------------------------------------ Recent Labs    08/17/19 0429  TSH 3.082    ------------------------------------------------------------------------------------------------------------------ No results for input(s): VITAMINB12, FOLATE, FERRITIN, TIBC, IRON, RETICCTPCT in the last 72 hours.  Coagulation profile No results for input(s): INR, PROTIME in the last 168 hours.  No results for input(s): DDIMER in the last 72 hours.  Cardiac Enzymes No results for input(s): CKMB, TROPONINI, MYOGLOBIN in the last 168 hours.  Invalid input(s): CK ------------------------------------------------------------------------------------------------------------------    Component Value Date/Time   BNP 40.6 08/16/2019 1230    Inpatient Medications  Scheduled Meds: . atorvastatin  40 mg Oral q1800  . enoxaparin (LOVENOX) injection  100 mg Subcutaneous Daily  . famotidine  40 mg Oral BID  . furosemide  80 mg Intravenous Q12H  . losartan  100 mg Oral Daily  . metoprolol succinate  25 mg Oral Daily  . potassium chloride SA  20 mEq Oral Daily  . predniSONE  10 mg Oral Q breakfast  . sodium chloride flush  3 mL Intravenous Q12H   Continuous Infusions: . sodium chloride     PRN Meds:.sodium chloride, acetaminophen **OR** acetaminophen, albuterol, HYDROcodone-acetaminophen, ondansetron **OR** ondansetron (ZOFRAN) IV, sodium chloride flush  Micro Results Recent Results (from the past 240 hour(s))  SARS CORONAVIRUS 2 (TAT 6-24 HRS) Nasopharyngeal Nasopharyngeal Swab     Status: None   Collection Time: 08/16/19  5:45 PM   Specimen: Nasopharyngeal Swab  Result Value Ref Range Status   SARS Coronavirus 2 NEGATIVE NEGATIVE Final    Comment: (NOTE) SARS-CoV-2 target nucleic acids are NOT DETECTED. The SARS-CoV-2 RNA is generally detectable in upper and lower respiratory specimens during the acute phase of infection. Negative results do not preclude SARS-CoV-2 infection, do not rule out co-infections with other pathogens, and should not be used as the sole basis for  treatment or other patient management decisions. Negative results must be combined with clinical observations, patient history, and epidemiological information. The expected result is Negative. Fact Sheet for Patients: SugarRoll.be Fact Sheet for Healthcare Providers: https://www.woods-mathews.com/ This test is not yet approved or cleared by the [place] FDA and  has been authorized for detection and/or diagnosis of SARS-CoV-2 by FDA under an Emergency Use Authorization (EUA). This EUA will remain  in effect (meaning this test can be used) for the duration of the COVID-19 declaration under Section 56 4(b)(1) of the Act, 21 U.S.C. section 360bbb-3(b)(1), unless the authorization is terminated or revoked sooner. Performed at [facility], [place] [address], [place], [place] [address]     Radiology Reports Dg Chest 2 View  Result Date: 08/16/2019 CLINICAL DATA:  Morbid obesity.  Shortness of breath and weakness. EXAM: CHEST - 2 VIEW COMPARISON:  October 26, 2018 FINDINGS: The study is limited due to patient body habitus. Stable cardiomegaly. The hila and mediastinum are normal. No pneumothorax. No pulmonary nodules, masses, or focal infiltrates identified. IMPRESSION: Limited study due to patient body habitus. No definite abnormalities. Electronically Signed   By: Dorise Bullion III M.D   On: 08/16/2019 13:08   Ct Angio Chest Pe W/cm &/or Wo Cm  Result Date: 08/16/2019 CLINICAL DATA:  Shortness of breath for 3 weeks EXAM: CT ANGIOGRAPHY CHEST WITH CONTRAST TECHNIQUE: Multidetector CT imaging of the chest was performed using the standard  protocol during bolus administration of intravenous contrast. Multiplanar CT image reconstructions and MIPs were obtained to evaluate the vascular anatomy. CONTRAST:  156mL OMNIPAQUE IOHEXOL 350 MG/ML SOLN COMPARISON:  05/10/2019 FINDINGS: Cardiovascular: Stable cardiomegaly. No pericardial effusion.  Examination is limited by contrast bolus timing and beam hardening artifact from patient body habitus. No central filling defect to suggest pulmonary embolism. There is cephalization of the pulmonary vasculature. Thoracic aorta normal in course and caliber. Mediastinum/Nodes: Mildly enlarged right paratracheal lymph node measures 11 mm short axis (series 6, image 30), not significantly changed from prior studies. No axillary or hilar lymphadenopathy. Enlarged thyroid gland, unchanged. Trachea and esophagus within normal limits. Lungs/Pleura: There is ill-defined ground-glass opacity in a predominantly perihilar distribution bilaterally. No focal airspace consolidation. No pleural effusion. No pneumothorax. Upper Abdomen: No acute findings. Musculoskeletal: No acute findings. Review of the MIP images confirms the above findings. IMPRESSION: 1. Limited exam.  No large central pulmonary embolus. 2. Cardiomegaly and pulmonary vascular congestion with ill-defined ground-glass opacity in a predominantly perihilar distribution. Findings are nonspecific but can be seen in the setting of pulmonary edema versus atypical infection in the appropriate clinical setting. Electronically Signed   By: Davina Poke M.D.   On: 08/16/2019 17:33    Time Spent in minutes 35   Aayra Hornbaker M.D on 08/19/2019 at 1:20 PM  Between 7am to 7pm - Pager - [phone]  After 7pm go to [email or web address] - password [facility]  [facility] -  Office  [phone]

## 2019-08-19 NOTE — Progress Notes (Signed)
Patient ID: [name], female   DOB: 09-01-1966, 53 y.o.   MRN: [id number] The patient is well-known to me.  I have been seeing her for many years now.  I was able to come by her bedside just now and place steroid injections in both her knees which she tolerated quite well.  She know to come see me as an outpatient and to wait at least 3 months for repeat knee injections.  This is to treat the pain from severe arthritis in both knees.

## 2019-08-19 NOTE — Plan of Care (Signed)
  Problem: Education: Goal: Knowledge of General Education information will improve Description: Including pain rating scale, medication(s)/side effects and non-pharmacologic comfort measures Outcome: Completed/Met   Problem: Clinical Measurements: Goal: Ability to maintain clinical measurements within normal limits will improve Outcome: Progressing Goal: Will remain free from infection Outcome: Completed/Met Goal: Diagnostic test results will improve Outcome: Progressing Goal: Respiratory complications will improve Outcome: Progressing Goal: Cardiovascular complication will be avoided Outcome: Progressing   Problem: Activity: Goal: Risk for activity intolerance will decrease Outcome: Progressing   Problem: Nutrition: Goal: Adequate nutrition will be maintained Outcome: Completed/Met   Problem: Coping: Goal: Level of anxiety will decrease Outcome: Completed/Met   Problem: Elimination: Goal: Will not experience complications related to bowel motility Outcome: Completed/Met Goal: Will not experience complications related to urinary retention Outcome: Completed/Met   Problem: Pain Managment: Goal: General experience of comfort will improve Outcome: Completed/Met   Problem: Safety: Goal: Ability to remain free from injury will improve Outcome: Progressing   Problem: Skin Integrity: Goal: Risk for impaired skin integrity will decrease Outcome: Progressing   Problem: Education: Goal: Ability to demonstrate management of disease process will improve Outcome: Completed/Met Goal: Ability to verbalize understanding of medication therapies will improve Outcome: Completed/Met Goal: Individualized Educational Video(s) Outcome: Not Applicable   Problem: Activity: Goal: Capacity to carry out activities will improve Outcome: Progressing   Problem: Cardiac: Goal: Ability to achieve and maintain adequate cardiopulmonary perfusion will improve Outcome: Progressing

## 2019-08-19 NOTE — Progress Notes (Signed)
[name]   DOB:Jul 11, 1966   [id number]   [id number]  Hematology follow up   Subjective: Patient is well-known to me, under my care for her iron deficient anemia.  She informed me that she was admitted to [facility] for worsening dyspnea.  She is feeling better after diuretics.    Objective:  Vitals:   08/19/19 1310 08/19/19 2026  BP: 113/75   Pulse: 85   Resp: 17 18  Temp: 97.8 F (36.6 C)   SpO2: 99% 98%    Body mass index is 83.84 kg/m.  Intake/Output Summary (Last 24 hours) at 08/19/2019 2137 Last data filed at 08/19/2019 2121 Gross per 24 hour  Intake 720 ml  Output 3250 ml  Net -2530 ml     Sclerae unicteric  Lungs clear -- no rales or rhonchi  Heart regular rate and rhythm  Abdomen benign  MSK no focal spinal tenderness, no peripheral edema  Neuro nonfocal   CBG (last 3)  Recent Labs    08/17/19 0727  GLUCAP 117*     Labs:   Urine Studies No results for input(s): UHGB, CRYS in the last 72 hours.  Invalid input(s): UACOL, UAPR, USPG, UPH, UTP, UGL, UKET, UBIL, UNIT, UROB, [place], UEPI, UWBC, [place], [place]  Basic Metabolic Panel: Recent Labs  Lab 08/16/19 1230 08/17/19 0429 08/18/19 0447 08/19/19 0336  NA 140 138 139 138  K 3.9 4.5 3.9 4.5  CL 101 97* 97* 97*  CO2 29 32 29 29  GLUCOSE 121* 156* 122* 119*  BUN 14 14 19  28*  CREATININE 0.70 0.73 1.14* 1.40*  CALCIUM 8.9 8.9 8.8* 8.7*  MG  --  1.6*  --   --   PHOS  --  3.8  --   --    GFR Estimated Creatinine Clearance: 92.1 mL/min (A) (by C-G formula based on SCr of 1.4 mg/dL (H)). Liver Function Tests: Recent Labs  Lab 08/16/19 1230 08/17/19 0429  AST 15 13*  ALT 15 14  ALKPHOS 44 44  BILITOT 0.8 1.4*  PROT 7.4 7.2  ALBUMIN 3.9 3.9   No results for input(s): LIPASE, AMYLASE in the last 168 hours. No results for input(s): AMMONIA in the last 168 hours. Coagulation profile No results for input(s): INR, PROTIME in the last 168  hours.  CBC: Recent Labs  Lab 08/16/19 1230 08/17/19 0429  WBC 8.0 10.4  NEUTROABS 5.8  --   HGB 10.8* 10.7*  HCT 37.7 37.3  MCV 84.3 83.6  PLT 183 191   Cardiac Enzymes: No results for input(s): CKTOTAL, CKMB, CKMBINDEX, TROPONINI in the last 168 hours. BNP: Invalid input(s): POCBNP CBG: Recent Labs  Lab 08/17/19 0727  GLUCAP 117*   D-Dimer No results for input(s): DDIMER in the last 72 hours. Hgb A1c No results for input(s): HGBA1C in the last 72 hours. Lipid Profile No results for input(s): CHOL, HDL, LDLCALC, TRIG, CHOLHDL, LDLDIRECT in the last 72 hours. Thyroid function studies Recent Labs    08/17/19 0429  TSH 3.082   Anemia work up No results for input(s): VITAMINB12, FOLATE, FERRITIN, TIBC, IRON, RETICCTPCT in the last 72 hours. Microbiology Recent Results (from the past 240 hour(s))  SARS CORONAVIRUS 2 (TAT 6-24 HRS) Nasopharyngeal Nasopharyngeal Swab     Status: None   Collection Time: 08/16/19  5:45 PM   Specimen: Nasopharyngeal Swab  Result Value Ref Range Status   SARS Coronavirus 2 NEGATIVE NEGATIVE Final    Comment: (NOTE) SARS-CoV-2 target nucleic  acids are NOT DETECTED. The SARS-CoV-2 RNA is generally detectable in upper and lower respiratory specimens during the acute phase of infection. Negative results do not preclude SARS-CoV-2 infection, do not rule out co-infections with other pathogens, and should not be used as the sole basis for treatment or other patient management decisions. Negative results must be combined with clinical observations, patient history, and epidemiological information. The expected result is Negative. Fact Sheet for Patients: SugarRoll.be Fact Sheet for Healthcare Providers: https://www.woods-mathews.com/ This test is not yet approved or cleared by the [place] FDA and  has been authorized for detection and/or diagnosis of SARS-CoV-2 by FDA under an Emergency Use  Authorization (EUA). This EUA will remain  in effect (meaning this test can be used) for the duration of the COVID-19 declaration under Section 56 4(b)(1) of the Act, 21 U.S.C. section 360bbb-3(b)(1), unless the authorization is terminated or revoked sooner. Performed at [facility], [place] [address] [place], [place] [address]       Studies:  No results found.  Assessment: 53 y.o. with morbid obesity, CHF, COPD on home oxygen, HTN, iron deficientanemia, presented with worsening dyspnea on exertion and swelling   1.  Acute on chronic diastolic CHF 2. COPD, OSA and obesity hypoventilation syndrome 3.  Chronic respite failure with hypoxia 4.  Iron deficient anemia 5. HTN 6.  Morbid obesity   Plan:  -she has been receiving IV iron as needed in my clinic, for intermittent mild anemia.  Her hemoglobin was 10.8 on admission, lower than usual level. She is due for lab check up  -I will check ferritin and iron study tomorrow. If low, will give iv fereheme, which she has been tolerating well -other management per hospitalist team. she is clinically improving with diuretics, but Cr has slightly increased with diuretics  -I will f/u as needed. I discussed with Dr. Clementeen Graham today.   Truitt Merle, MD 08/19/2019

## 2019-08-19 NOTE — Telephone Encounter (Signed)
Patient left a voicemail wanting to let Dr. Burr Medico know that she is currently admitted at [facility] in room 1425. She stated she missed her last lab appointment on 08/05/19 and wanted to know if Dr. Burr Medico wanted her inpatient team to draw any iron study lab work while she was admitted. Will pass information along to Dr. Burr Medico.

## 2019-08-19 NOTE — Progress Notes (Signed)
Patient ID: [name], female   DOB: [birth year], 53 y.o.   MRN: [id number] I did get a call this weekend about possibly coming by the patient's room to place steroid injections in here knees to treat her osteoarthritis pain.  She is well-known to me and I have done this plenty of times before.  I am in the office al day today.  If I get a chance to come by late today to inject her knees, I will.  If she is discharged today, she can always call the office for a follow-up to come in since she is well-known to our practice.

## 2019-08-20 LAB — IRON AND TIBC
Iron: 23 ug/dL — ABNORMAL LOW (ref 28–170)
Saturation Ratios: 6 % — ABNORMAL LOW (ref 10.4–31.8)
TIBC: 365 ug/dL (ref 250–450)
UIBC: 342 ug/dL

## 2019-08-20 LAB — BASIC METABOLIC PANEL
Anion gap: 11 (ref 5–15)
BUN: 33 mg/dL — ABNORMAL HIGH (ref 6–20)
CO2: 31 mmol/L (ref 22–32)
Calcium: 8.8 mg/dL — ABNORMAL LOW (ref 8.9–10.3)
Chloride: 96 mmol/L — ABNORMAL LOW (ref 98–111)
Creatinine, Ser: 1.25 mg/dL — ABNORMAL HIGH (ref 0.44–1.00)
GFR calc Af Amer: 57 mL/min — ABNORMAL LOW (ref >60)
GFR calc non Af Amer: 49 mL/min — ABNORMAL LOW (ref >60)
Glucose, Bld: 128 mg/dL — ABNORMAL HIGH (ref 70–99)
Potassium: 4.6 mmol/L (ref 3.5–5.1)
Sodium: 138 mmol/L (ref 135–145)

## 2019-08-20 LAB — FERRITIN: Ferritin: 16 ng/mL (ref 11–307)

## 2019-08-20 MED ORDER — LORATADINE 10 MG PO TABS
10.0000 mg | ORAL_TABLET | Freq: Every day | ORAL | Status: DC
  Administered 2019-08-20 – 2019-08-22 (×3): 10 mg via ORAL
  Filled 2019-08-20 (×3): qty 1

## 2019-08-20 MED ORDER — SODIUM CHLORIDE 0.9 % IV SOLN
510.0000 mg | Freq: Once | INTRAVENOUS | Status: AC
  Administered 2019-08-20: 510 mg via INTRAVENOUS
  Filled 2019-08-20: qty 17

## 2019-08-20 NOTE — Progress Notes (Addendum)
PROGRESS NOTE                                                                                                                                                                                                             Patient Demographics:    [name], is a 53 y.o. female, DOB - 03-13-1966, [id number]  Admit date - 08/16/2019   Admitting Physician Toy Baker, MD  Outpatient Primary MD for the patient is [name], NP  LOS - 3  Outpatient Specialists: Dr. [name] (cardiology)  Chief Complaint  Patient presents with  . Shortness of Breath       Brief Narrative 53 year old morbidly obese female (BMI of 48) with history of diastolic CHF, COPD, chronic respiratory failure on 2 L via nasal cannula, GERD, hypertension, hypothyroidism, history of sarcoidosis, OSA, anemia, hyperlipidemia and iron deficiency anemia presented with 3 weeks of increasing shortness of breath with leg swellings and dyspnea on exertion.  Also reports orthopnea.  She reports that for the same duration she was having pain in her knees and started taking Lasix only once a day (since she would have to get up to go to the bathroom during the night that would worsen her knee pain).  She was hospitalized in July for CHF exacerbation when she was diuresed with IV Lasix but upon discharge Lasix dose was reduced to 40 mg twice a day as she appeared to be quite dry.  She has not been able to schedule an appointment with her cardiologist since the discharge.  Patient also noticed that she was gaining weight for the past 3 weeks.  Patient admitted for acute on chronic diastolic CHF.  On review of the chart patient had gained about 14 pounds (was 226 kg) upon discharge from the hospital back in July.   Subjective:   Reports of breathing has started to get better today.  Net negative >6 L since admission and weight improved ((about 2 L above  baseline weight of 226 kg) received cortisone shot in her knee yesterday and feels better.  Assessment  & Plan :   Principal problem Acute on chronic diastolic CHF ([place]) Continue to diurese well on IV Lasix 80 mg every 12 hours.  Put out 3.35 L past 24 hours. Continue current dose.  Strict I's/O and daily weight. appt arranged with cardiology in  2 weeks ( see f/up appt on AVS)  Monitor electrolyte and replace.   Active Problems: Chest pain Secondary to fluid overload, resolved.  Acute kidney injury ([place]) Mild, secondary to diuresis.  Continue to monitor   Chronic respiratory failure with hypoxia Likely combination of her CHF and OSA/OHS. Stable on 2 L via nasal cannula.  Nighttime CPAP  Essential hypertension Continue home meds.  Stable  Iron deficiency anemia Secondary to chronic blood loss.  Stable.  Being followed by Dr. Annamaria Boots in hematology clinic and receives IV iron intermittently.  She has ordered ferritin iron study and plans to give her IV Feraheme if low.  History of sarcoidosis Stable.  On methotrexate and chronic steroid.  Continue  Hypothyroidism .  Continue Synthroid  Arthritis of the knee 3 monthly steroid injection with Dr. [name].  Patient has been having worsening bilateral knee pain for several weeks.  Dr. [name] gave her cortisone injection at bedside on 10/19.    Morbid obesity (BMI 85 kg/m) Nutrition consulted  Code Status : Full code  Family Communication  : None at bedside  Disposition Plan  : Home possibly in the continues to diurese adequately Barriers For Discharge : Active symptoms  Consults  : Dr. [name] (orthopedics), Dr. Burr Medico (hematology)  Procedures  : 2D echo  DVT Prophylaxis  :  Lovenox -  Lab Results  Component Value Date   PLT 191 08/17/2019    Antibiotics  :    Anti-infectives (From admission, onward)   None        Objective:   Vitals:   08/20/19 0520 08/20/19 0520 08/20/19 0652 08/20/19 1240  BP: 120/62  120/62  127/60  Pulse: 60 60  79  Resp: 18 18  16   Temp: 98.2 F (36.8 C) 98.2 F (36.8 C)  98.3 F (36.8 C)  TempSrc: Oral Oral  Oral  SpO2: 100% 100%  98%  Weight:   (!) 227.9 kg   Height:        Wt Readings from Last 3 Encounters:  08/20/19 (!) 227.9 kg  07/31/19 (!) 225.9 kg  05/12/19 (!) 226.1 kg     Intake/Output Summary (Last 24 hours) at 08/20/2019 1256 Last data filed at 08/20/2019 1150 Gross per 24 hour  Intake 360 ml  Output 4475 ml  Net -4115 ml   Physical exam Morbidly obese female, no acute distress HEENT: Moist mucosa, supple neck Chest: Clear CVS: Normal S1-S2, no murmurs GI: Soft, nondistended, nontender Musculoskeletal: Trace edema (improved)      Data Review:    CBC Recent Labs  Lab 08/16/19 1230 08/17/19 0429  WBC 8.0 10.4  HGB 10.8* 10.7*  HCT 37.7 37.3  PLT 183 191  MCV 84.3 83.6  MCH 24.2* 24.0*  MCHC 28.6* 28.7*  RDW 18.3* 18.6*  LYMPHSABS 1.1  --   MONOABS 0.7  --   EOSABS 0.3  --   BASOSABS 0.0  --     Chemistries  Recent Labs  Lab 08/16/19 1230 08/17/19 0429 08/18/19 0447 08/19/19 0336 08/20/19 0422  NA 140 138 139 138 138  K 3.9 4.5 3.9 4.5 4.6  CL 101 97* 97* 97* 96*  CO2 29 32 29 29 31   GLUCOSE 121* 156* 122* 119* 128*  BUN 14 14 19  28* 33*  CREATININE 0.70 0.73 1.14* 1.40* 1.25*  CALCIUM 8.9 8.9 8.8* 8.7* 8.8*  MG  --  1.6*  --   --   --   AST 15 13*  --   --   --  ALT 15 14  --   --   --   ALKPHOS 44 44  --   --   --   BILITOT 0.8 1.4*  --   --   --    ------------------------------------------------------------------------------------------------------------------ No results for input(s): CHOL, HDL, LDLCALC, TRIG, CHOLHDL, LDLDIRECT in the last 72 hours.  Lab Results  Component Value Date   HGBA1C 5.7 05/08/2019   ------------------------------------------------------------------------------------------------------------------ No results for input(s): TSH, T4TOTAL, T3FREE, THYROIDAB in the last  72 hours.  Invalid input(s): FREET3 ------------------------------------------------------------------------------------------------------------------ Recent Labs    08/20/19 0422  FERRITIN 16  TIBC 365  IRON 23*    Coagulation profile No results for input(s): INR, PROTIME in the last 168 hours.  No results for input(s): DDIMER in the last 72 hours.  Cardiac Enzymes No results for input(s): CKMB, TROPONINI, MYOGLOBIN in the last 168 hours.  Invalid input(s): CK ------------------------------------------------------------------------------------------------------------------    Component Value Date/Time   BNP 40.6 08/16/2019 1230    Inpatient Medications  Scheduled Meds: . atorvastatin  40 mg Oral q1800  . enoxaparin (LOVENOX) injection  100 mg Subcutaneous Daily  . famotidine  40 mg Oral BID  . furosemide  80 mg Intravenous Q12H  . losartan  100 mg Oral Daily  . metoprolol succinate  25 mg Oral Daily  . potassium chloride SA  20 mEq Oral Daily  . predniSONE  10 mg Oral Q breakfast  . sodium chloride flush  3 mL Intravenous Q12H   Continuous Infusions: . sodium chloride     PRN Meds:.sodium chloride, acetaminophen **OR** acetaminophen, albuterol, HYDROcodone-acetaminophen, ondansetron **OR** ondansetron (ZOFRAN) IV, sodium chloride flush  Micro Results Recent Results (from the past 240 hour(s))  SARS CORONAVIRUS 2 (TAT 6-24 HRS) Nasopharyngeal Nasopharyngeal Swab     Status: None   Collection Time: 08/16/19  5:45 PM   Specimen: Nasopharyngeal Swab  Result Value Ref Range Status   SARS Coronavirus 2 NEGATIVE NEGATIVE Final    Comment: (NOTE) SARS-CoV-2 target nucleic acids are NOT DETECTED. The SARS-CoV-2 RNA is generally detectable in upper and lower respiratory specimens during the acute phase of infection. Negative results do not preclude SARS-CoV-2 infection, do not rule out co-infections with other pathogens, and should not be used as the sole basis for  treatment or other patient management decisions. Negative results must be combined with clinical observations, patient history, and epidemiological information. The expected result is Negative. Fact Sheet for Patients: SugarRoll.be Fact Sheet for Healthcare Providers: https://www.woods-mathews.com/ This test is not yet approved or cleared by the [place] FDA and  has been authorized for detection and/or diagnosis of SARS-CoV-2 by FDA under an Emergency Use Authorization (EUA). This EUA will remain  in effect (meaning this test can be used) for the duration of the COVID-19 declaration under Section 56 4(b)(1) of the Act, 21 U.S.C. section 360bbb-3(b)(1), unless the authorization is terminated or revoked sooner. Performed at [facility], [place] [address] [place], [place] [address]     Radiology Reports Dg Chest 2 View  Result Date: 08/16/2019 CLINICAL DATA:  Morbid obesity.  Shortness of breath and weakness. EXAM: CHEST - 2 VIEW COMPARISON:  October 26, 2018 FINDINGS: The study is limited due to patient body habitus. Stable cardiomegaly. The hila and mediastinum are normal. No pneumothorax. No pulmonary nodules, masses, or focal infiltrates identified. IMPRESSION: Limited study due to patient body habitus. No definite abnormalities. Electronically Signed   By: Dorise Bullion III M.D   On: 08/16/2019 13:08   Ct Angio  Chest Pe W/cm &/or Wo Cm  Result Date: 08/16/2019 CLINICAL DATA:  Shortness of breath for 3 weeks EXAM: CT ANGIOGRAPHY CHEST WITH CONTRAST TECHNIQUE: Multidetector CT imaging of the chest was performed using the standard protocol during bolus administration of intravenous contrast. Multiplanar CT image reconstructions and MIPs were obtained to evaluate the vascular anatomy. CONTRAST:  137mL OMNIPAQUE IOHEXOL 350 MG/ML SOLN COMPARISON:  05/10/2019 FINDINGS: Cardiovascular: Stable cardiomegaly. No pericardial effusion.  Examination is limited by contrast bolus timing and beam hardening artifact from patient body habitus. No central filling defect to suggest pulmonary embolism. There is cephalization of the pulmonary vasculature. Thoracic aorta normal in course and caliber. Mediastinum/Nodes: Mildly enlarged right paratracheal lymph node measures 11 mm short axis (series 6, image 30), not significantly changed from prior studies. No axillary or hilar lymphadenopathy. Enlarged thyroid gland, unchanged. Trachea and esophagus within normal limits. Lungs/Pleura: There is ill-defined ground-glass opacity in a predominantly perihilar distribution bilaterally. No focal airspace consolidation. No pleural effusion. No pneumothorax. Upper Abdomen: No acute findings. Musculoskeletal: No acute findings. Review of the MIP images confirms the above findings. IMPRESSION: 1. Limited exam.  No large central pulmonary embolus. 2. Cardiomegaly and pulmonary vascular congestion with ill-defined ground-glass opacity in a predominantly perihilar distribution. Findings are nonspecific but can be seen in the setting of pulmonary edema versus atypical infection in the appropriate clinical setting. Electronically Signed   By: Davina Poke M.D.   On: 08/16/2019 17:33    Time Spent in minutes 35   Micheale Schlack M.D on 08/20/2019 at 12:56 PM  Between 7am to 7pm - Pager - [phone]  After 7pm go to [email or web address] - password [facility]  [facility] -  Office  [phone]

## 2019-08-20 NOTE — Plan of Care (Signed)
  Problem: Health Behavior/Discharge Planning: Goal: Ability to manage health-related needs will improve Outcome: Progressing   Problem: Clinical Measurements: Goal: Ability to maintain clinical measurements within normal limits will improve Outcome: Progressing Goal: Diagnostic test results will improve Outcome: Progressing Goal: Respiratory complications will improve Outcome: Progressing   Problem: Activity: Goal: Risk for activity intolerance will decrease Outcome: Progressing

## 2019-08-21 LAB — BASIC METABOLIC PANEL
Anion gap: 14 (ref 5–15)
BUN: 34 mg/dL — ABNORMAL HIGH (ref 6–20)
CO2: 32 mmol/L (ref 22–32)
Calcium: 9.2 mg/dL (ref 8.9–10.3)
Chloride: 92 mmol/L — ABNORMAL LOW (ref 98–111)
Creatinine, Ser: 1.11 mg/dL — ABNORMAL HIGH (ref 0.44–1.00)
GFR calc Af Amer: >60 mL/min (ref >60)
GFR calc non Af Amer: 57 mL/min — ABNORMAL LOW (ref >60)
Glucose, Bld: 119 mg/dL — ABNORMAL HIGH (ref 70–99)
Potassium: 4.1 mmol/L (ref 3.5–5.1)
Sodium: 138 mmol/L (ref 135–145)

## 2019-08-21 LAB — MAGNESIUM: Magnesium: 1.9 mg/dL (ref 1.7–2.4)

## 2019-08-21 MED ORDER — SODIUM CHLORIDE 0.9 % IV SOLN: 510.0000 mg | Freq: Once | INTRAVENOUS | Status: DC

## 2019-08-21 MED ORDER — TORSEMIDE 20 MG PO TABS
40.0000 mg | ORAL_TABLET | Freq: Two times a day (BID) | ORAL | Status: DC
  Administered 2019-08-21 – 2019-08-22 (×2): 40 mg via ORAL
  Filled 2019-08-21 (×3): qty 2

## 2019-08-21 NOTE — Care Management Important Message (Signed)
Important Message  Patient Details IM Letter given to Rhea Pink SW to present to the Patient Name: [name] MRN: [id number] Date of Birth: [birth year]   Medicare Important Message Given:  Yes     Kerin Salen 08/21/2019, 10:32 AM

## 2019-08-21 NOTE — Progress Notes (Signed)
Per CM, patient does not need home O2 desaturation evaluation, as she is from home with 2L oxygen at baseline and is currently on the same rate.   [name], Fraser Din 08/21/2019 10:32 AM

## 2019-08-21 NOTE — Progress Notes (Addendum)
PROGRESS NOTE                                                                                                                                                                                                             Patient Demographics:    [name], is a 53 y.o. female, DOB - 1966-07-06, [id number]  Admit date - 08/16/2019   Admitting Physician Kim Baker, MD  Outpatient Primary MD for the patient is Kim Peng, NP  LOS - 4  Outpatient Specialists: Dr. [name] (cardiology)  Chief Complaint  Patient presents with  . Shortness of Breath       Brief Narrative 53 year old morbidly obese female (BMI of 62) with history of diastolic CHF, COPD, chronic respiratory failure on 2 L via nasal cannula, GERD, hypertension, hypothyroidism, history of sarcoidosis, OSA, anemia, hyperlipidemia and iron deficiency anemia presented with 3 weeks of increasing shortness of breath with leg swellings and dyspnea on exertion.  Also reports orthopnea.  She reports that for the same duration she was having pain in her knees and started taking Lasix only once a day (since she would have to get up to go to the bathroom during the night that would worsen her knee pain).  She was hospitalized in July for CHF exacerbation when she was diuresed with IV Lasix but upon discharge Lasix dose was reduced to 40 mg twice a day as she appeared to be quite dry.  She has not been able to schedule an appointment with her cardiologist since the discharge.  Patient also noticed that she was gaining weight for the past 3 weeks.  Patient admitted for acute on chronic diastolic CHF.  On review of the chart patient had gained about 14 pounds (was 226 kg) upon discharge from the hospital back in July.   Subjective:   Breathing still remains heavy but improving.  No nausea no vomiting no chest pain abdominal pain but no diarrhea no  constipation.   Assessment  & Plan :   Principal problem Acute on chronic diastolic CHF ([place]) Treated with IV Lasix 80 mg twice daily.  Exam prior to this the patient was on oral Lasix 40 mg twice daily. Patient had a recent hospitalization for acute CHF exacerbation for which patient was discharged with oral Lasix of 120 mg twice daily which was felt to be  aggressive per cardiology's note. Discussed with cardiology on-call.  Transition to oral torsemide. For short course we will use 40 mg twice daily dose.  Within the next 48 hours transition to 40 mg daily dose. Recommend patient to use extra 20 mg dose for weight gain on discharge. appt arranged with cardiology in 2 weeks ( see f/up appt on AVS)  Monitor electrolyte and replace.   Active Problems: Chest pain Secondary to fluid overload, resolved.  Acute kidney injury ([place]) Cardiorenal hemodynamics improving with diuresis.  Chronic respiratory failure with hypoxia Likely combination of her CHF and OSA/OHS. Stable on 2 L via nasal cannula.  Nighttime CPAP  Essential hypertension Continue home meds.  Stable  Iron deficiency anemia Secondary to chronic blood loss.  Stable.  Being followed by Dr. Annamaria Boots in hematology clinic and receives IV iron intermittently.   IV Feraheme on 08/20/2019  History of sarcoidosis Stable.  On methotrexate and chronic steroid.  Continue  Hypothyroidism Continue Synthroid  Arthritis of the knee 3 monthly steroid injection with Dr. Ninfa Macias.  Patient has been having worsening bilateral knee pain for several weeks.  Dr. Ninfa Macias gave her cortisone injection at bedside on 10/19.  Morbid obesity (BMI 85 kg/m) Nutrition consulted  Code Status : Full code  Family Communication  : None at bedside  Disposition Plan  : Home possibly in the continues to diurese adequately Barriers For Discharge : Active symptoms  Consults  : Dr. Ninfa Macias (orthopedics), Dr. Burr Medico (hematology)  Procedures  : 2D  echo  DVT Prophylaxis  :  Lovenox -  Lab Results  Component Value Date   PLT 191 08/17/2019    Antibiotics  :    Anti-infectives (From admission, onward)   None        Objective:   Vitals:   08/20/19 2048 08/21/19 0611 08/21/19 0638 08/21/19 1312  BP: (!) 128/56 (!) 122/50  (!) 147/67  Pulse: 76 73  70  Resp: 16 16  17   Temp: 98.2 F (36.8 C)   98.1 F (36.7 C)  TempSrc:    Oral  SpO2: 98% 98%  99%  Weight:   (!) 227.3 kg   Height:        Wt Readings from Last 3 Encounters:  08/21/19 (!) 227.3 kg  07/31/19 (!) 225.9 kg  05/12/19 (!) 226.1 kg     Intake/Output Summary (Last 24 hours) at 08/21/2019 1710 Last data filed at 08/21/2019 1130 Gross per 24 hour  Intake 692.97 ml  Output 5100 ml  Net -4407.03 ml   General: alert and oriented to time, place, and person. Appear in mild distress, affect appropriate Eyes: PERRL, Conjunctiva normal ENT: Oral Clear, moist  Neck: difficult to assess  JVD, no Abnormal Mass Or lumps Cardiovascular: S1 and S2 Present, no Murmur, peripheral pulses symmetrical Respiratory: good respiratory effort, Bilateral Air entry equal and Decreased, no signs of accessory muscle use, Clear to Auscultation, no Crackles, no wheezes Abdomen: Bowel Sound present, Soft and no tenderness, no hernia Skin: no rashes  Extremities: trace Pedal edema, no calf tenderness Neurologic: without any new focal findings Gait not checked due to patient safety concerns      Data Review:    CBC Recent Labs  Lab 08/16/19 1230 08/17/19 0429  WBC 8.0 10.4  HGB 10.8* 10.7*  HCT 37.7 37.3  PLT 183 191  MCV 84.3 83.6  MCH 24.2* 24.0*  MCHC 28.6* 28.7*  RDW 18.3* 18.6*  LYMPHSABS 1.1  --   MONOABS 0.7  --  EOSABS 0.3  --   BASOSABS 0.0  --     Chemistries  Recent Labs  Lab 08/16/19 1230 08/17/19 0429 08/18/19 0447 08/19/19 0336 08/20/19 0422 08/21/19 0947  NA 140 138 139 138 138 138  K 3.9 4.5 3.9 4.5 4.6 4.1  CL 101 97* 97* 97* 96*  92*  CO2 29 32 29 29 31  32  GLUCOSE 121* 156* 122* 119* 128* 119*  BUN 14 14 19  28* 33* 34*  CREATININE 0.70 0.73 1.14* 1.40* 1.25* 1.11*  CALCIUM 8.9 8.9 8.8* 8.7* 8.8* 9.2  MG  --  1.6*  --   --   --  1.9  AST 15 13*  --   --   --   --   ALT 15 14  --   --   --   --   ALKPHOS 44 44  --   --   --   --   BILITOT 0.8 1.4*  --   --   --   --    ------------------------------------------------------------------------------------------------------------------ No results for input(s): CHOL, HDL, LDLCALC, TRIG, CHOLHDL, LDLDIRECT in the last 72 hours.  Lab Results  Component Value Date   HGBA1C 5.7 05/08/2019   ------------------------------------------------------------------------------------------------------------------ No results for input(s): TSH, T4TOTAL, T3FREE, THYROIDAB in the last 72 hours.  Invalid input(s): FREET3 ------------------------------------------------------------------------------------------------------------------ Recent Labs    08/20/19 0422  FERRITIN 16  TIBC 365  IRON 23*    Coagulation profile No results for input(s): INR, PROTIME in the last 168 hours.  No results for input(s): DDIMER in the last 72 hours.  Cardiac Enzymes No results for input(s): CKMB, TROPONINI, MYOGLOBIN in the last 168 hours.  Invalid input(s): CK ------------------------------------------------------------------------------------------------------------------    Component Value Date/Time   BNP 40.6 08/16/2019 1230    Inpatient Medications  Scheduled Meds: . atorvastatin  40 mg Oral q1800  . enoxaparin (LOVENOX) injection  100 mg Subcutaneous Daily  . famotidine  40 mg Oral BID  . loratadine  10 mg Oral Daily  . losartan  100 mg Oral Daily  . metoprolol succinate  25 mg Oral Daily  . potassium chloride SA  20 mEq Oral Daily  . predniSONE  10 mg Oral Q breakfast  . sodium chloride flush  3 mL Intravenous Q12H  . torsemide  40 mg Oral BID   Continuous  Infusions: . sodium chloride     PRN Meds:.sodium chloride, acetaminophen **OR** acetaminophen, albuterol, HYDROcodone-acetaminophen, ondansetron **OR** ondansetron (ZOFRAN) IV, sodium chloride flush  Micro Results Recent Results (from the past 240 hour(s))  SARS CORONAVIRUS 2 (TAT 6-24 HRS) Nasopharyngeal Nasopharyngeal Swab     Status: None   Collection Time: 08/16/19  5:45 PM   Specimen: Nasopharyngeal Swab  Result Value Ref Range Status   SARS Coronavirus 2 NEGATIVE NEGATIVE Final    Comment: (NOTE) SARS-CoV-2 target nucleic acids are NOT DETECTED. The SARS-CoV-2 RNA is generally detectable in upper and lower respiratory specimens during the acute phase of infection. Negative results do not preclude SARS-CoV-2 infection, do not rule out co-infections with other pathogens, and should not be used as the sole basis for treatment or other patient management decisions. Negative results must be combined with clinical observations, patient history, and epidemiological information. The expected result is Negative. Fact Sheet for Patients: SugarRoll.be Fact Sheet for Healthcare Providers: https://www.woods-mathews.com/ This test is not yet approved or cleared by the [place] FDA and  has been authorized for detection and/or diagnosis of SARS-CoV-2 by FDA under an Emergency Use Authorization (  EUA). This EUA will remain  in effect (meaning this test can be used) for the duration of the COVID-19 declaration under Section 56 4(b)(1) of the Act, 21 U.S.C. section 360bbb-3(b)(1), unless the authorization is terminated or revoked sooner. Performed at [facility], [place] [address] [place], [place] [address]     Radiology Reports Dg Chest 2 View  Result Date: 08/16/2019 CLINICAL DATA:  Morbid obesity.  Shortness of breath and weakness. EXAM: CHEST - 2 VIEW COMPARISON:  October 26, 2018 FINDINGS: The study is limited due to patient body  habitus. Stable cardiomegaly. The hila and mediastinum are normal. No pneumothorax. No pulmonary nodules, masses, or focal infiltrates identified. IMPRESSION: Limited study due to patient body habitus. No definite abnormalities. Electronically Signed   By: Dorise Bullion III M.D   On: 08/16/2019 13:08   Ct Angio Chest Pe W/cm &/or Wo Cm  Result Date: 08/16/2019 CLINICAL DATA:  Shortness of breath for 3 weeks EXAM: CT ANGIOGRAPHY CHEST WITH CONTRAST TECHNIQUE: Multidetector CT imaging of the chest was performed using the standard protocol during bolus administration of intravenous contrast. Multiplanar CT image reconstructions and MIPs were obtained to evaluate the vascular anatomy. CONTRAST:  169mL OMNIPAQUE IOHEXOL 350 MG/ML SOLN COMPARISON:  05/10/2019 FINDINGS: Cardiovascular: Stable cardiomegaly. No pericardial effusion. Examination is limited by contrast bolus timing and beam hardening artifact from patient body habitus. No central filling defect to suggest pulmonary embolism. There is cephalization of the pulmonary vasculature. Thoracic aorta normal in course and caliber. Mediastinum/Nodes: Mildly enlarged right paratracheal lymph node measures 11 mm short axis (series 6, image 30), not significantly changed from prior studies. No axillary or hilar lymphadenopathy. Enlarged thyroid gland, unchanged. Trachea and esophagus within normal limits. Lungs/Pleura: There is ill-defined ground-glass opacity in a predominantly perihilar distribution bilaterally. No focal airspace consolidation. No pleural effusion. No pneumothorax. Upper Abdomen: No acute findings. Musculoskeletal: No acute findings. Review of the MIP images confirms the above findings. IMPRESSION: 1. Limited exam.  No large central pulmonary embolus. 2. Cardiomegaly and pulmonary vascular congestion with ill-defined ground-glass opacity in a predominantly perihilar distribution. Findings are nonspecific but can be seen in the setting of pulmonary  edema versus atypical infection in the appropriate clinical setting. Electronically Signed   By: Davina Poke M.D.   On: 08/16/2019 17:33    Time Spent in minutes 35   Berle Mull M.D on 08/21/2019 at 5:10 PM

## 2019-08-22 LAB — CBC
HCT: 39.2 % (ref 36.0–46.0)
Hemoglobin: 11.4 g/dL — ABNORMAL LOW (ref 12.0–15.0)
MCH: 24.2 pg — ABNORMAL LOW (ref 26.0–34.0)
MCHC: 29.1 g/dL — ABNORMAL LOW (ref 30.0–36.0)
MCV: 83.1 fL (ref 80.0–100.0)
Platelets: 242 10*3/uL (ref 150–400)
RBC: 4.72 MIL/uL (ref 3.87–5.11)
RDW: 19.6 % — ABNORMAL HIGH (ref 11.5–15.5)
WBC: 13 10*3/uL — ABNORMAL HIGH (ref 4.0–10.5)
nRBC: 0 % (ref 0.0–0.2)

## 2019-08-22 LAB — BASIC METABOLIC PANEL
Anion gap: 18 — ABNORMAL HIGH (ref 5–15)
BUN: 35 mg/dL — ABNORMAL HIGH (ref 6–20)
CO2: 28 mmol/L (ref 22–32)
Calcium: 9.1 mg/dL (ref 8.9–10.3)
Chloride: 92 mmol/L — ABNORMAL LOW (ref 98–111)
Creatinine, Ser: 1.12 mg/dL — ABNORMAL HIGH (ref 0.44–1.00)
GFR calc Af Amer: >60 mL/min (ref >60)
GFR calc non Af Amer: 56 mL/min — ABNORMAL LOW (ref >60)
Glucose, Bld: 127 mg/dL — ABNORMAL HIGH (ref 70–99)
Potassium: 4.2 mmol/L (ref 3.5–5.1)
Sodium: 138 mmol/L (ref 135–145)

## 2019-08-22 LAB — MAGNESIUM: Magnesium: 1.9 mg/dL (ref 1.7–2.4)

## 2019-08-22 MED ORDER — TORSEMIDE 20 MG PO TABS: 40.0000 mg | ORAL_TABLET | Freq: Every day | ORAL | 0 refills | Status: DC

## 2019-08-22 NOTE — TOC Initial Note (Signed)
Transition of Care [facility]) - Initial/Assessment Note    Patient Details  Name: [name] MRN: [id number] Date of Birth: 1966-03-24  [facility]) CM/SW Contact:    Wende Neighbors, LCSW Phone Number: 08/22/2019, 10:59 AM  Clinical Narrative:  CSW received verbal consult to assist patient with [facility]. CSW spoke with patient at bedside and she stated that her adult children live in the home with her and they are able to assist her with needs. Patient stated that she also has a [place] aide that come for a couple of hours a day 7 days a week from [facility] agency. CSW asked if patient would be agreeable to have [facility] come into the home to follow her and she stated she did not think that was necessary. Patient stated she  Receives her oxygen through [employer] and will have her ride bring portable tank to the hospital when they pick her up. CSW signing off                    Expected Discharge Plan: [place] Barriers to Discharge: No Barriers Identified   Patient Goals and CMS Choice   CMS Medicare.gov Compare Post Acute Care list provided to:: Patient Choice offered to / list presented to : Patient  Expected Discharge Plan and Services Expected Discharge Plan: [place] In-house Referral: Clinical Social Work   Post Acute Care Choice: [place] arrangements for the past 2 months: Apartment                           HH Arranged: Refused HH          Prior Living Arrangements/Services Living arrangements for the past 2 months: Apartment Lives with:: Self, Adult Children Patient language and need for interpreter reviewed:: Yes        Need for Family Participation in Patient Care: Yes (Comment) Care giver support system in place?: Yes (comment) Current home services: Homehealth aide Criminal Activity/Legal Involvement Pertinent to Current Situation/Hospitalization: No - Comment as needed  Activities  of Daily Living Home Assistive Devices/Equipment: None ADL Screening (condition at time of admission) Patient's cognitive ability adequate to safely complete daily activities?: Yes Is the patient deaf or have difficulty hearing?: No Does the patient have difficulty seeing, even when wearing glasses/contacts?: No Does the patient have difficulty concentrating, remembering, or making decisions?: No Patient able to express need for assistance with ADLs?: Yes Does the patient have difficulty dressing or bathing?: No Independently performs ADLs?: Yes (appropriate for developmental age) Does the patient have difficulty walking or climbing stairs?: Yes(D/T her size) Weakness of Legs: Both Weakness of Arms/Hands: Both  Permission Sought/Granted Permission sought to share information with : Case Manager Permission granted to share information with : Yes, Verbal Permission Granted  Share Information with NAME: Sherlean Salasar     Permission granted to share info w Relationship: son  Permission granted to share info w Contact Information: [phone]  Emotional Assessment Appearance:: Appears stated age Attitude/Demeanor/Rapport: Engaged Affect (typically observed): Accepting, Pleasant, Calm Orientation: : Oriented to Self, Oriented to Place, Oriented to  Time, Oriented to Situation Alcohol / Substance Use: Not Applicable Psych Involvement: No (comment)  Admission diagnosis:  Acute on chronic congestive heart failure, unspecified heart failure type [facility]) [I50.9] Patient Active Problem List   Diagnosis Date Noted  . Diastolic CHF ([place]) [id number]  . Acute on chronic  diastolic (congestive) heart failure ([place]) 05/08/2019  . CHF exacerbation ([place]) 04/17/2018  . Unilateral primary osteoarthritis, left knee 02/20/2017  . Unilateral primary osteoarthritis, right knee 02/20/2017  . COPD exacerbation ([place]) 02/12/2017  . GERD (gastroesophageal reflux disease) 02/12/2017  . Abnormal uterine  bleeding 08/09/2016  . Microcytic anemia 02/27/2016  . Hyperlipidemia 02/24/2016  . Anxiety 04/14/2015  . SOB (shortness of breath)   . Hypoxemia   . Symptomatic anemia 01/15/2015  . Hypothyroidism 01/15/2015  . Morbid obesity ([place]) 01/15/2015  . Fibroids 12/09/2013  . Hyperglycemia 09/16/2013  . Sarcoidosis 09/09/2012  . Iron deficiency anemia due to chronic blood loss 09/09/2012  . Hypertension 02/25/2011  . Obesity hypoventilation syndrome ([place]) 02/14/2011  . OSA (obstructive sleep apnea) 02/10/2011   PCP:  [name], NP Pharmacy:   [id number] - [place], [place] [address] Phone: [phone] Fax: [phone]     Social Determinants of Health (SDOH) Interventions    Readmission Risk Interventions No flowsheet data found.

## 2019-08-22 NOTE — Progress Notes (Signed)
Patient's IV removed.  Site WNL.  AVS reviewed with patient.  Verbalized understanding of discharge instructions, physician follow-up, medications.  Patient stable awaiting family for discharge.

## 2019-08-26 NOTE — Progress Notes (Signed)
Cardiology Office Note   Date:  08/28/2019   ID:  [name], DOB 1965-11-15, MRN [id number]  PCP:  [name], NP  Cardiologist:  Dr. [name]    Chief Complaint  Patient presents with  . Hospitalization Follow-up      History of Present Illness: [name] is a 53 y.o. female who presents for post hospital,   She has a hx of diastolic heart failure, morbid obesity, hypothyroidism, COPD hypertension, chronic respiratory failure on 2L via [place],  OSA, anemia, iron def. ,  sarcoidosis with  March 2019 seen in consultation.  She was hospitalized in July 2020 with HF exacerbation,  On follow up due to lack of urinary output with 120 mg Lasix BID she was decreased to 40 mg BID and extra 40 prn. .   She was readmitted 08/16/19 with increased SOB, CTA of chest no PE + cardiomegaly and fluid overload.  BNP has been low with all acute exacerbations.  Her wt was up 14 lbs from July.  Pt was admitted and diuresed, pt on ARB,  On echo EF 55-60% with moderately increased LVH, mildly elevated pulmonary artery systolic pressure.  With her knee pain she had both knees injected by Dr. Rush Farmer.    At discharge pt on torsemide 40 mg daily and extra 20 prn , she did receive IV feraheme on 08/20/19.  Wt at discharge 227.3 Kg 500.3 lbs,   Today she feels much improved.  Her scales are 10 lbs less than hospital and her wt is up only 1 lb.  No pitting edema. She is able to walk around her home without SOB, she could hardly go to [place] prior to hospitalization.  She is passing more urine with the torsemide.  Her knees are much improved as well.  She is stable after Iron infusion and usually receives every 3 months.   She is using her CPAP without issues.  She sees her PCP tomorrow and will have BMP and CBC done there.  Her BP is up some but she has not yet had her morning meds.    Pt has no symptoms of COVID and she tested neg in hospital.  She only goes out for MD appts.  When out she  wars a mask.   Past Medical History:  Diagnosis Date  . Anemia   . Arthritis    hands, shoulders, no meds  . CHF (congestive heart failure) (HCC)    EF60-65%  . Chronic hyperventilation syndrome    w/ obesity tx with albuterol inhaler and oxygen 2L  . COPD (chronic obstructive pulmonary disease) ([place])    uses oxygen 2 L  . Gallstones   . GERD (gastroesophageal reflux disease)    diet controlled - no meds  . H/O hiatal hernia   . Hypertension   . Hypothyroidism   . Kidney stones   . Morbid obesity ([place])   . Pneumonia    hoispitalized in 08/2011  . Sarcoidosis   . Seasonal allergies   . Sleep apnea    uses CPAP machine     Past Surgical History:  Procedure Laterality Date  . [place]   x 1  . CHOLECYSTECTOMY  2000  . DILATION AND CURETTAGE OF UTERUS N/A 08/09/2016   Procedure: DILATATION AND CURETTAGE;  Surgeon: Everitt Amber, MD;  Location: [facility];  Service: Gynecology;  Laterality: N/A;  . HYSTEROSCOPY W/D&C  12/27/2011   Procedure: DILATATION AND CURETTAGE /HYSTEROSCOPY;  Surgeon: Maeola Sarah.  Landry Mellow, MD;  Location: [facility];  Service: Gynecology;;  . I and D of abcess  05/2011  . INTRAUTERINE DEVICE (IUD) INSERTION N/A 08/09/2016   Procedure: INTRAUTERINE DEVICE (IUD) INSERTION;  Surgeon: Everitt Amber, MD;  Location: [facility];  Service: Gynecology;  Laterality: N/A;  . LUNG BIOPSY    . uterine abletion       Current Outpatient Medications  Medication Sig Dispense Refill  . albuterol (PROAIR HFA) 108 (90 Base) MCG/ACT inhaler Inhale 1-2 puffs into the lungs every 6 (six) hours as needed for wheezing or shortness of breath.    Marland Kitchen atorvastatin (LIPITOR) 40 MG tablet Take 1 tablet (40 mg total) by mouth daily at 6 PM. 90 tablet 1  . calcium citrate-vitamin D (CITRACAL+D) 315-200 MG-UNIT tablet Take 1 tablet by mouth daily.    . cetirizine (ZYRTEC) 10 MG tablet Take 10 mg by mouth at bedtime as needed for allergies.    . famotidine (PEPCID) 40 MG tablet Take 1 tablet (40  mg total) by mouth 2 (two) times daily. [id number] tablet 3  . folic acid (FOLVITE) 1 MG tablet Take 1 mg by mouth daily.  3  . HYDROcodone-acetaminophen (NORCO) 10-325 MG tablet Take 1 tablet by mouth every 6 (six) hours as needed (pain). 30 tablet 0  . losartan (COZAAR) 100 MG tablet TAKE 1 TABLET (100 MG TOTAL) DAILY BY MOUTH. 90 tablet 3  . methotrexate 50 MG/2ML injection Inject 20 mg into the vein once a week. Dose is 0.8 mL (20mg ). Take on Fridays    . metoprolol succinate (TOPROL-XL) 25 MG 24 hr tablet TAKE 1 TABLET BY MOUTH EVERY DAY 90 tablet 3  . NON FORMULARY 2 liter of oxygen    . potassium chloride SA (KLOR-CON) 20 MEQ tablet Take 20 mEq by mouth daily.    . predniSONE (DELTASONE) 10 MG tablet Take 1 tablet (10 mg total) by mouth daily. 10 tablet 0  . torsemide (DEMADEX) 20 MG tablet Take 2 tablets (40 mg total) by mouth daily. Take additional 20 mg for weight gain of 3 lbs in 1 day or 5 lbs in 2 days or swelling or shortness of breath. 90 tablet 0  . triamcinolone acetonide (KENALOG) 40 MG/ML injection Inject 40 mg into the muscle every 3 (three) months. For knee pain    . triamcinolone ointment (KENALOG) 0.1 % Apply 1 application topically 2 (two) times daily. For Sarcoidosis    . vitamin B-12 1000 MCG tablet Take 1 tablet (1,000 mcg total) by mouth daily. 30 tablet 0   No current facility-administered medications for this visit.     Allergies:   Tape    Social History:  The patient  reports that she has never smoked. She has never used smokeless tobacco. She reports current alcohol use. She reports that she does not use drugs.   Family History:  The patient's family history includes Aneurysm in her sister; Cancer (age of onset: 32) in her father; Deep vein thrombosis in her mother; Diabetes in her brother and father.    ROS:  General:no colds or fevers, no weight changes but 1 lb  Skin:no rashes or ulcers HEENT:no blurred vision, no congestion CV:see HPI PUL:see HPI GI:no  diarrhea constipation or melena, no indigestion GU:no hematuria, no dysuria MS:no joint pain- knee pain improved, no claudication Neuro:no syncope, no lightheadedness- none since discharge but prior to hospitalization she felt lightheaded.  Endo:no diabetes, no thyroid disease  Wt Readings from Last 3 Encounters:  08/28/19 Marland Kitchen)  491 lb (222.7 kg)  08/22/19 (!) 493 lb 11.2 oz (223.9 kg)  07/31/19 (!) 498 lb (225.9 kg)     PHYSICAL EXAM: VS:  BP (!) 147/68   Pulse 77   Temp 98.4 F (36.9 C)   Ht 5\' 5"  (1.651 m)   Wt (!) 491 lb (222.7 kg)   SpO2 96%   BMI 81.71 kg/m  , BMI Body mass index is 81.71 kg/m. General:Pleasant affect, NAD Skin:Warm and dry, brisk capillary refill HEENT:normocephalic, sclera clear, mucus membranes moist Neck:supple, no JVD, no bruits  Heart:S1S2 RRR without murmur, gallup, rub or click Lungs:clear without rales, rhonchi, or wheezes [id number], non tender, + BS, do not palpate liver spleen or masses Ext:no lower ext edema, 2+ pedal pulses, 2+ radial pulses Neuro:alert and oriented, MAE, follows commands, + facial symmetry    EKG:  EKG is not ordered today. EKG from hospital reviewed. 08/16/19  SR no acute ST changes.   Recent Labs: 08/16/2019: B Natriuretic Peptide 40.6 08/17/2019: ALT 14; TSH 3.082 08/22/2019: BUN 35; Creatinine, Ser 1.12; Hemoglobin 11.4; Magnesium 1.9; Platelets 242; Potassium 4.2; Sodium 138    Lipid Panel    Component Value Date/Time   CHOL 241 (H) 05/08/2019 0811   TRIG 125.0 05/08/2019 0811   HDL 58.80 05/08/2019 0811   CHOLHDL 4 05/08/2019 0811   VLDL 25.0 05/08/2019 0811   LDLCALC 157 (H) 05/08/2019 0811       Other studies Reviewed: Additional studies/ records that were reviewed today include:  Echo 08/17/19 .  IMPRESSIONS    1. Left ventricular ejection fraction, by visual estimation, is 55 to 60%. The left ventricle has normal function. Normal left ventricular size. There is moderately increased left  ventricular hypertrophy.  2. Elevated mean left atrial pressure.  3. Left ventricular diastolic Doppler parameters are consistent with pseudonormalization pattern of LV diastolic filling.  4. Global right ventricle has normal systolic function.The right ventricular size is normal. No increase in right ventricular wall thickness.  5. Left atrial size was mildly dilated.  6. Right atrial size was not well visualized.  7. The pericardial effusion is posterior to the left ventricle.  8. Trivial pericardial effusion is present.  9. The mitral valve is normal in structure. No evidence of mitral valve regurgitation. 10. The tricuspid valve is normal in structure. Tricuspid valve regurgitation is trivial. 11. The aortic valve is normal in structure. Aortic valve regurgitation is trivial by color flow Doppler. 12. The pulmonic valve was grossly normal. Pulmonic valve regurgitation is not visualized by color flow Doppler. 13. Mildly elevated pulmonary artery systolic pressure. 14. The inferior vena cava is normal in size with greater than 50% respiratory variability, suggesting right atrial pressure of 3 mmHg.  FINDINGS  Left Ventricle: Left ventricular ejection fraction, by visual estimation, is 55 to 60%. The left ventricle has normal function. There is moderately increased left ventricular hypertrophy. Concentric left ventricular hypertrophy. Normal left ventricular  size. Spectral Doppler shows Left ventricular diastolic Doppler parameters are consistent with pseudonormalization pattern of LV diastolic filling. Elevated mean left atrial pressure.  Right Ventricle: The right ventricular size is normal. No increase in right ventricular wall thickness. Global RV systolic function is has normal systolic function. The tricuspid regurgitant velocity is 2.88 m/s, and with an assumed right atrial pressure  of 3 mmHg, the estimated right ventricular systolic pressure is mildly elevated at 36.2 mmHg.  Left  Atrium: Left atrial size was mildly dilated.  Right Atrium: Right atrial size was not  well visualized  Pericardium: Trivial pericardial effusion is present. The pericardial effusion is posterior to the left ventricle.  Mitral Valve: The mitral valve is normal in structure. No evidence of mitral valve regurgitation.  Tricuspid Valve: The tricuspid valve is normal in structure. Tricuspid valve regurgitation is trivial by color flow Doppler.  Aortic Valve: The aortic valve is normal in structure. Aortic valve regurgitation is trivial by color flow Doppler.  Pulmonic Valve: The pulmonic valve was grossly normal. Pulmonic valve regurgitation is not visualized by color flow Doppler.  Aorta: The aortic root and ascending aorta are structurally normal, with no evidence of dilitation.  Venous: The inferior vena cava is normal in size with greater than 50% respiratory variability, suggesting right atrial pressure of 3 mmHg.  IAS/Shunts: No atrial level shunt detected by color flow Doppler.     LEFT VENTRICLE PLAX 2D LVIDd:         4.60 cm  Diastology LVIDs:         3.20 cm  LV e' lateral:   7.29 cm/s LV PW:         1.60 cm  LV E/e' lateral: 11.3 LV IVS:        1.60 cm  LV e' medial:    7.18 cm/s LVOT diam:     2.10 cm  LV E/e' medial:  11.5 LV SV:         56 ml LV SV Index:   16.38 LVOT Area:     3.46 cm    RIGHT VENTRICLE RV Basal diam:  2.70 cm RV S prime:     14.60 cm/s TAPSE (M-mode): 2.1 cm  LEFT ATRIUM             Index LA diam:        3.50 cm 1.19 cm/m LA Vol (A2C):   37.9 ml 12.85 ml/m LA Vol (A4C):   72.2 ml 24.47 ml/m LA Biplane Vol: 54.0 ml 18.30 ml/m  AORTIC VALVE LVOT Vmax:   111.00 cm/s LVOT Vmean:  71.050 cm/s LVOT VTI:    0.200 m   AORTA Ao Root diam: 2.90 cm Ao Asc diam:  2.60 cm  MITRAL VALVE                        TRICUSPID VALVE MV Area (PHT): 3.42 cm             TR Peak grad:   33.2 mmHg MV PHT:        64.38 msec           TR Vmax:         288.00 cm/s MV Decel Time: 222 msec MV E velocity: 82.30 cm/s 103 cm/s  SHUNTS MV A velocity: 60.40 cm/s 70.3 cm/s Systemic VTI:  0.20 m MV E/A ratio:  1.36       1.5       Systemic Diam: 2.10 cm   ASSESSMENT AND PLAN:  1.  Chronic diastolic HF with recent exacerbation.  Lasix changed to torsemide and pt is doing well, no wt gain, we discussed for 3 lb wt gain to take extra torsemide- she was agreeable.  She watches her salt intake.  Follow up with me or Dr. [name] in 5-6 weeks virtual is fine.  For BMP and CBC tomorrow with PCP  2.  OSA on CPAP and using.  3. Iron def anemia has iron infusion in hospital.   .   4.  Morbid obesity  have discussed diet before.  She is aware of dangers  5.  Sarcoidosis per pulmonary with chronic chest tightness with deep breath.on methotrexate prednisone   6.  Osteoarthritis of knees - just infected in hospital.   7.  Chronic hypoxia on home oxygen. Per pulmonary.   Current medicines are reviewed with the patient today.  The patient Has no concerns regarding medicines.  The following changes have been made:  See above Labs/ tests ordered today include:see above  Disposition:   FU:  see above  Signed, Cecilie Kicks, NP  08/28/2019 9:19 AM    Melissa Keo, [place], [place], [place] Phone: [phone]; Fax: [phone]

## 2019-08-26 NOTE — Discharge Summary (Signed)
[facility] Discharge Summary   Patient: [name] [id number]   PCP: [name], NP DOB: 12/15/1965   Date of admission: 08/16/2019   Date of discharge: 08/22/2019     Discharge Diagnoses:  Principal diagnosis Acute on chronic diastolic CHF Active Problems:   OSA (obstructive sleep apnea)   Obesity hypoventilation syndrome ([place])   Hypertension   Sarcoidosis   Iron deficiency anemia due to chronic blood loss   Hypothyroidism   Morbid obesity (HCC)   SOB (shortness of breath)   Hyperlipidemia   GERD (gastroesophageal reflux disease)   Acute on chronic diastolic (congestive) heart failure (HCC)   Diastolic CHF ([place])   Admitted From: Home Disposition:  Home   Recommendations for Outpatient Follow-up:  1. PCP: Please follow-up in 1 week with repeat BMP 2. Follow up LABS/TEST: Repeat BMP  Follow-up Information    Isaiah Serge, NP Follow up on 08/28/2019.   Specialties: Cardiology, Radiology Why: 9:15 AM Contact information: [address] [place] [address] [phone]        [name], NP Follow up.   Specialty: Family Medicine Why: repeat BMP in 1 week Contact information: [place] [address] [phone]          Diet recommendation: Cardiac diet  Activity: The patient is advised to gradually reintroduce usual activities,as tolerated .  Discharge Condition: good  Code Status: Full code   History of present illness: As per the H and P dictated on admission, "[name] is a 53 y.o. female with medical history significant of CHF(last EF> 65%), COPD, chronic respiratory failure on 2L via [place], GERD, HTN, hypothyroidism, sarcoidosis, OSA, anemia, & hyperlipidemia, iron deficiency anemia   Presented with   3 wk hx of SOB and proggresive leg swelling, pain dyspnea on exertion and orthopnea lower extremity swelling. She used to be on Lasix IV 20 mg twice a day but it was changed  to 40 mg twice a day she states because of her kidney function.  She also noted some left lower extremity pain and that has prevented her from using the bathroom and ambulating.  She has history of arthritis and have not gotten her shots secondary to Covid pandemic She has had numerous DVT studies in the past to rule out blood clot and this is ongoing chronic issue. Patient is sedentary and unable to ambulate easily. Reports some chest tightness which is chronic and she attributes to her history of sarcoidosis. No fevers or chills no cough no abdominal pain no nausea no vomiting or diarrhea no syncope   Last admission for CHF exacerbation was in July 2020  at that time she tolerated while IV diuresis  When her Lasix was increased to 120 twice daily patient states she could not urinate at all cardiology thought perhaps she was too dry and she does not require such a high dose she has been doing better on 40 g."  Hospital Course:  Summary of her active problems in the hospital is as following. Acute on chronic diastolic CHF (HCC) Treated with IV Lasix 80 mg twice daily.   prior to this the patient was on oral Lasix 40 mg twice daily. Patient had a recent hospitalization for acute CHF exacerbation for which patient was discharged with oral Lasix of 120 mg twice daily which was felt to be aggressive per cardiology's note. Discussed with cardiology on-call.  Transition to oral torsemide. For short course we used 40 mg twice  daily dose.  Discharging home on 40 mg daily dose. Recommend patient to use extra 20 mg dose for weight gain on discharge. appt arranged with cardiology in 2 weeks ( see f/up appt on AVS)  Monitor electrolyte and replace.   Active Problems: Chest pain Secondary to fluid overload, resolved.  Acute kidney injury ([place]) Cardiorenal hemodynamics improving with diuresis.  Chronic respiratory failure with hypoxia Likely combination of her CHF and OSA/OHS. Stable on 2 L  via nasal cannula.  Nighttime CPAP  Essential hypertension Continue home meds.  Stable  Iron deficiency anemia Secondary to chronic blood loss.  Stable.  Being followed by Dr. Annamaria Boots in hematology clinic and receives IV iron intermittently.   IV Feraheme on 08/20/2019  History of sarcoidosis Stable.  On methotrexate and chronic steroid.  Continue  Hypothyroidism Continue Synthroid  Arthritis of the knee 3 monthly steroid injection with Dr. [name].  Patient has been having worsening bilateral knee pain for several weeks.  Dr. [name] gave her cortisone injection at bedside on 10/19.  Morbid obesity (BMI 85 kg/m) Nutrition consulted  Patient was recommended Home health, which was arranged by case manager. On the day of the discharge the patient's vitals were stable, and no other acute medical condition were reported by patient. the patient was felt safe to be discharge at Home with Home health.  Consultants: none Procedures: none  DISCHARGE MEDICATION: Allergies as of 08/22/2019   No Known Allergies     Medication List    STOP taking these medications   furosemide 40 MG tablet Commonly known as: LASIX   Vitamin D (Ergocalciferol) 1.25 MG (50000 UT) Caps capsule Commonly known as: DRISDOL     TAKE these medications   albuterol 108 (90 Base) MCG/ACT inhaler Commonly known as: ProAir HFA Inhale 1-2 puffs into the lungs every 6 (six) hours as needed for wheezing or shortness of breath.   atorvastatin 40 MG tablet Commonly known as: LIPITOR Take 1 tablet (40 mg total) by mouth daily at 6 PM.   calcium citrate-vitamin D 315-200 MG-UNIT tablet Commonly known as: CITRACAL+D Take 1 tablet by mouth daily.   cetirizine 10 MG tablet Commonly known as: ZYRTEC Take 10 mg by mouth at bedtime as needed for allergies.   cyanocobalamin 1000 MCG tablet Take 1 tablet (1,000 mcg total) by mouth daily.   diphenhydrAMINE 25 mg capsule Commonly known as: BENADRYL Take 25  mg by mouth every 6 (six) hours as needed (fever).   famotidine 40 MG tablet Commonly known as: PEPCID Take 1 tablet (40 mg total) by mouth 2 (two) times daily.   folic acid 1 MG tablet Commonly known as: FOLVITE Take 1 mg by mouth daily.   HYDROcodone-acetaminophen 10-325 MG tablet Commonly known as: NORCO Take 1 tablet by mouth every 6 (six) hours as needed (pain).   Kenalog 40 MG/ML injection Generic drug: triamcinolone acetonide Inject 40 mg into the muscle every 3 (three) months. For knee pain   losartan 100 MG tablet Commonly known as: COZAAR TAKE 1 TABLET (100 MG TOTAL) DAILY BY MOUTH.   methotrexate 50 MG/2ML injection Inject 20 mg into the vein once a week. Dose is 0.8 mL (20mg ). Take on Fridays   metoprolol succinate 25 MG 24 hr tablet Commonly known as: TOPROL-XL TAKE 1 TABLET BY MOUTH EVERY DAY   NON FORMULARY 2 liter of oxygen   potassium chloride SA 20 MEQ tablet Commonly known as: KLOR-CON Take as directed by cardiologist What changed:   how much  to take  how to take this  when to take this  additional instructions   predniSONE 10 MG tablet Commonly known as: DELTASONE Take 1 tablet (10 mg total) by mouth daily.   torsemide 20 MG tablet Commonly known as: DEMADEX Take 2 tablets (40 mg total) by mouth daily. Take additional 20 mg for weight gain of 3 lbs in 1 day or 5 lbs in 2 days or swelling or shortness of breath.   triamcinolone ointment 0.1 % Commonly known as: KENALOG Apply 1 application topically 2 (two) times daily. For Sarcoidosis      No Known Allergies Discharge Instructions    Diet - low sodium heart healthy   Complete by: As directed    Increase activity slowly   Complete by: As directed      Discharge Exam: Filed Weights   08/20/19 0652 08/21/19 0638 08/22/19 0553  Weight: (!) 227.9 kg (!) 227.3 kg (!) 223.9 kg   Vitals:   08/21/19 2043 08/22/19 0553  BP: (!) 135/49 (!) 126/56  Pulse: 67 64  Resp: 20 18  Temp:  97.9 F (36.6 C) 98.2 F (36.8 C)  SpO2: 100% 100%   General: Appear in mild distress, no Rash; Oral Mucosa Clear, moist. no Abnormal Mass Or lumps Cardiovascular: S1 and S2 Present, no Murmur, Respiratory: normal respiratory effort, Bilateral Air entry present and Clear to Auscultation, no Crackles, no wheezes Abdomen: Bowel Sound present, Soft and no tenderness, no hernia Extremities: bilateral  Pedal edema, no calf tenderness Neurology: alert and oriented to time, place, and person affect appropriate.  The results of significant diagnostics from this hospitalization (including imaging, microbiology, ancillary and laboratory) are listed below for reference.    Significant Diagnostic Studies: Dg Chest 2 View  Result Date: 08/16/2019 CLINICAL DATA:  Morbid obesity.  Shortness of breath and weakness. EXAM: CHEST - 2 VIEW COMPARISON:  October 26, 2018 FINDINGS: The study is limited due to patient body habitus. Stable cardiomegaly. The hila and mediastinum are normal. No pneumothorax. No pulmonary nodules, masses, or focal infiltrates identified. IMPRESSION: Limited study due to patient body habitus. No definite abnormalities. Electronically Signed   By: Dorise Bullion III M.D   On: 08/16/2019 13:08   Ct Angio Chest Pe W/cm &/or Wo Cm  Result Date: 08/16/2019 CLINICAL DATA:  Shortness of breath for 3 weeks EXAM: CT ANGIOGRAPHY CHEST WITH CONTRAST TECHNIQUE: Multidetector CT imaging of the chest was performed using the standard protocol during bolus administration of intravenous contrast. Multiplanar CT image reconstructions and MIPs were obtained to evaluate the vascular anatomy. CONTRAST:  18mL OMNIPAQUE IOHEXOL 350 MG/ML SOLN COMPARISON:  05/10/2019 FINDINGS: Cardiovascular: Stable cardiomegaly. No pericardial effusion. Examination is limited by contrast bolus timing and beam hardening artifact from patient body habitus. No central filling defect to suggest pulmonary embolism. There is  cephalization of the pulmonary vasculature. Thoracic aorta normal in course and caliber. Mediastinum/Nodes: Mildly enlarged right paratracheal lymph node measures 11 mm short axis (series 6, image 30), not significantly changed from prior studies. No axillary or hilar lymphadenopathy. Enlarged thyroid gland, unchanged. Trachea and esophagus within normal limits. Lungs/Pleura: There is ill-defined ground-glass opacity in a predominantly perihilar distribution bilaterally. No focal airspace consolidation. No pleural effusion. No pneumothorax. Upper Abdomen: No acute findings. Musculoskeletal: No acute findings. Review of the MIP images confirms the above findings. IMPRESSION: 1. Limited exam.  No large central pulmonary embolus. 2. Cardiomegaly and pulmonary vascular congestion with ill-defined ground-glass opacity in a predominantly perihilar distribution. Findings are  nonspecific but can be seen in the setting of pulmonary edema versus atypical infection in the appropriate clinical setting. Electronically Signed   By: Davina Poke M.D.   On: 08/16/2019 17:33    Microbiology: Recent Results (from the past 240 hour(s))  SARS CORONAVIRUS 2 (TAT 6-24 HRS) Nasopharyngeal Nasopharyngeal Swab     Status: None   Collection Time: 08/16/19  5:45 PM   Specimen: Nasopharyngeal Swab  Result Value Ref Range Status   SARS Coronavirus 2 NEGATIVE NEGATIVE Final    Comment: (NOTE) SARS-CoV-2 target nucleic acids are NOT DETECTED. The SARS-CoV-2 RNA is generally detectable in upper and lower respiratory specimens during the acute phase of infection. Negative results do not preclude SARS-CoV-2 infection, do not rule out co-infections with other pathogens, and should not be used as the sole basis for treatment or other patient management decisions. Negative results must be combined with clinical observations, patient history, and epidemiological information. The expected result is Negative. Fact Sheet for  Patients: SugarRoll.be Fact Sheet for Healthcare Providers: https://www.woods-mathews.com/ This test is not yet approved or cleared by the [place] FDA and  has been authorized for detection and/or diagnosis of SARS-CoV-2 by FDA under an Emergency Use Authorization (EUA). This EUA will remain  in effect (meaning this test can be used) for the duration of the COVID-19 declaration under Section 56 4(b)(1) of the Act, 21 U.S.C. section 360bbb-3(b)(1), unless the authorization is terminated or revoked sooner. Performed at [facility], [place] [address], [place], [place] [address]      Labs: CBC: Recent Labs  Lab 08/22/19 0410  WBC 13.0*  HGB 11.4*  HCT 39.2  MCV 83.1  PLT [id number]   Basic Metabolic Panel: Recent Labs  Lab 08/20/19 0422 08/21/19 0947 08/22/19 0410  NA 138 138 138  K 4.6 4.1 4.2  CL 96* 92* 92*  CO2 31 32 28  GLUCOSE 128* 119* 127*  BUN 33* 34* 35*  CREATININE 1.25* 1.11* 1.12*  CALCIUM 8.8* 9.2 9.1  MG  --  1.9 1.9   Liver Function Tests: No results for input(s): AST, ALT, ALKPHOS, BILITOT, PROT, ALBUMIN in the last 168 hours. No results for input(s): LIPASE, AMYLASE in the last 168 hours. No results for input(s): AMMONIA in the last 168 hours. Cardiac Enzymes: No results for input(s): CKTOTAL, CKMB, CKMBINDEX, TROPONINI in the last 168 hours. BNP (last 3 results) Recent Labs    10/26/18 2325 05/08/19 0953 08/16/19 1230  BNP 52.8 54.2 40.6   CBG: No results for input(s): GLUCAP in the last 168 hours.  Time spent: 35 minutes  Signed:  Berle Mull  [facility] 08/22/2019  8:33 AM

## 2019-08-27 ENCOUNTER — Telehealth: Payer: Self-pay | Admitting: Cardiology

## 2019-08-27 NOTE — Telephone Encounter (Signed)
New message:      Patient calling because she would like for her appt to be a VV, she states she just got out the hospital a week ago. Please call patient.

## 2019-08-27 NOTE — Telephone Encounter (Signed)
Returned call to pt.  Changed her appt to Virtual since she didn't have a ride to her appt since her son has to work. However, she does have an appt with her PCP on 08/29/2019, in which she can get a BMET at that time  Pt very grateful.

## 2019-08-28 ENCOUNTER — Encounter: Payer: Self-pay | Admitting: Cardiology

## 2019-08-28 ENCOUNTER — Other Ambulatory Visit: Payer: Self-pay

## 2019-08-28 ENCOUNTER — Telehealth (INDEPENDENT_AMBULATORY_CARE_PROVIDER_SITE_OTHER): Payer: Medicare HMO | Admitting: Cardiology

## 2019-08-28 VITALS — BP 147/68 | HR 77 | Temp 98.4°F | Ht 65.0 in | Wt >= 6400 oz

## 2019-08-28 DIAGNOSIS — J9611 Chronic respiratory failure with hypoxia: Secondary | ICD-10-CM

## 2019-08-28 DIAGNOSIS — I5032 Chronic diastolic (congestive) heart failure: Secondary | ICD-10-CM | POA: Diagnosis not present

## 2019-08-28 DIAGNOSIS — M159 Polyosteoarthritis, unspecified: Secondary | ICD-10-CM

## 2019-08-28 DIAGNOSIS — Z9989 Dependence on other enabling machines and devices: Secondary | ICD-10-CM

## 2019-08-28 DIAGNOSIS — D509 Iron deficiency anemia, unspecified: Secondary | ICD-10-CM

## 2019-08-28 DIAGNOSIS — G4733 Obstructive sleep apnea (adult) (pediatric): Secondary | ICD-10-CM

## 2019-08-28 NOTE — Patient Instructions (Signed)
Medication Instructions:  Your physician recommends that you continue on your current medications as directed. Please refer to the Current Medication list given to you today.  *If you need a refill on your cardiac medications before your next appointment, please call your pharmacy*  Lab Work: None ordered  If you have labs (blood work) drawn today and your tests are completely normal, you will receive your results only by: Marland Kitchen MyChart Message (if you have MyChart) OR . A paper copy in the mail If you have any lab test that is abnormal or we need to change your treatment, we will call you to review the results.  Testing/Procedures: None ordered  Follow-Up: At [facility], you and your health needs are our priority.  As part of our continuing mission to provide you with exceptional heart care, we have created designated Provider Care Teams.  These Care Teams include your primary Cardiologist (physician) and Advanced Practice Providers (APPs -  Physician Assistants and Nurse Practitioners) who all work together to provide you with the care you need, when you need it.  Your next appointment:   5-6 WEEKS  10/03/2019 AT 9:00 VIRTUAL VIDEO JUST LIKE TODAY  The format for your next appointment:   Virtual Visit   Provider:   Cecilie Kicks, NP  Other Instructions If weight is up 3 lbs in a day, take extra 20 mg torsemide. If no improvement then call the office. Continue with low salt diet. Weigh daily.

## 2019-08-29 ENCOUNTER — Other Ambulatory Visit: Payer: Self-pay

## 2019-08-29 ENCOUNTER — Encounter: Payer: Self-pay | Admitting: Adult Health

## 2019-08-29 ENCOUNTER — Ambulatory Visit (INDEPENDENT_AMBULATORY_CARE_PROVIDER_SITE_OTHER): Payer: Medicare HMO | Admitting: Adult Health

## 2019-08-29 DIAGNOSIS — D500 Iron deficiency anemia secondary to blood loss (chronic): Secondary | ICD-10-CM

## 2019-08-29 DIAGNOSIS — I5033 Acute on chronic diastolic (congestive) heart failure: Secondary | ICD-10-CM

## 2019-08-29 DIAGNOSIS — I10 Essential (primary) hypertension: Secondary | ICD-10-CM | POA: Diagnosis not present

## 2019-08-29 DIAGNOSIS — G4733 Obstructive sleep apnea (adult) (pediatric): Secondary | ICD-10-CM

## 2019-08-29 NOTE — Progress Notes (Signed)
Virtual Visit via Video Note  I connected with [facility] on 08/29/19 at  7:00 AM EDT by a video enabled telemedicine application and verified that I am speaking with the correct person using two identifiers.  Location patient: home Location provider:work or home office Persons participating in the virtual visit: patient, provider  I discussed the limitations of evaluation and management by telemedicine and the availability of in person appointments. The patient expressed understanding and agreed to proceed.   HPI:  TCM visit   Admit Date 08/16/2019 Discharge Date 08/22/2019  She presented to the emergency room with 3 weeks of shortness of breath and progressive leg swelling/pain and DOE.   In the past she was on Lasix 120 mg twice a day but it was eventually changed to 40 mg twice a day due to kidney function she could not urinate at all and cardiology thought perhaps that she was too dry did not require such a high dose.  She had also noticed some left lower extremity pain that had prevented her from using the bathroom and ambulating.  She has had numerous DVT studies in the past to rule out blood clots and this is an ongoing chronic issue.  Hospital Course  Acute on chronic diastolic CHF - treated with IV Lasix 80 mg twice daily. -Patient had a recent hospitalization for acute CHF exacerbation for which patient was discharged on oral Lasix of 120 mg twice daily, which was felt to be too aggressive per cardiology note.  She was discussed with cardiology on-call.  And transition to oral torsemide.  While in the hospital they did a short course of 40 mg twice daily and she was discharged home on this dose.  It was recommended that patient use an extra 20 mg dose for weight gain on discharge. -Monitor electrolytes on discharge and replace if needed  * She has had her follow up appointment with Cardiology yesterday.   Chest Pain  -Secondary to fluid overload, had resolved on  discharge  Acute kidney injury -Improved with diuresis  Chronic respiratory failure with hypoxia -Thought to be likely combination of her CHF and OSA -She was stable on 2 L via nasal cannula.  Nighttime CPAP  Hypertension -Was stable and continued on home meds  Iron deficiency anemia -Secondary to chronic blood loss.  Stable.  She has been followed by Dr. Annamaria Boots in hematology clinic and receives IV iron intermittently -She received IV Feraheme on 08/20/2019  History of sarcoidosis -Stable.  On methotrexate and chronic steroid  Hypothyroidism -Continue Synthroid  Today she reports that she is feeling much improved.  She feels as though the torsemide works much better than furosemide, states "I was the bathroom all day and all night yesterday".  She is able to walk around the house without any shortness of breath and denies any chest pain.  She continues to use her CPAP nightly without any issues.  She has gained 1 pound since being discharged from the hospital. overall she denies any acute complaints.    ROS: See pertinent positives and negatives per HPI.  Past Medical History:  Diagnosis Date  . Anemia   . Arthritis    hands, shoulders, no meds  . CHF (congestive heart failure) (HCC)    EF60-65%  . Chronic hyperventilation syndrome    w/ obesity tx with albuterol inhaler and oxygen 2L  . COPD (chronic obstructive pulmonary disease) ([place])    uses oxygen 2 L  . Gallstones   . GERD (gastroesophageal  reflux disease)    diet controlled - no meds  . H/O hiatal hernia   . Hypertension   . Hypothyroidism   . Kidney stones   . Morbid obesity ([place])   . Pneumonia    hoispitalized in 08/2011  . Sarcoidosis   . Seasonal allergies   . Sleep apnea    uses CPAP machine     Past Surgical History:  Procedure Laterality Date  . [place]   x 1  . CHOLECYSTECTOMY  2000  . DILATION AND CURETTAGE OF UTERUS N/A 08/09/2016   Procedure: DILATATION AND CURETTAGE;   Surgeon: Everitt Amber, MD;  Location: [facility];  Service: Gynecology;  Laterality: N/A;  . HYSTEROSCOPY W/D&C  12/27/2011   Procedure: DILATATION AND CURETTAGE /HYSTEROSCOPY;  Surgeon: Maeola Sarah. Landry Mellow, MD;  Location: [facility];  Service: Gynecology;;  . I and D of abcess  05/2011  . INTRAUTERINE DEVICE (IUD) INSERTION N/A 08/09/2016   Procedure: INTRAUTERINE DEVICE (IUD) INSERTION;  Surgeon: Everitt Amber, MD;  Location: [facility];  Service: Gynecology;  Laterality: N/A;  . LUNG BIOPSY    . uterine abletion      Family History  Problem Relation Age of Onset  . Diabetes Father   . Cancer Father 92       colon cancer   . Diabetes Brother   . Deep vein thrombosis Mother   . Aneurysm Sister        d/o brain aneurysm      Current Outpatient Medications:  .  albuterol (PROAIR HFA) 108 (90 Base) MCG/ACT inhaler, Inhale 1-2 puffs into the lungs every 6 (six) hours as needed for wheezing or shortness of breath., Disp: , Rfl:  .  atorvastatin (LIPITOR) 40 MG tablet, Take 1 tablet (40 mg total) by mouth daily at 6 PM., Disp: 90 tablet, Rfl: 1 .  calcium citrate-vitamin D (CITRACAL+D) 315-200 MG-UNIT tablet, Take 1 tablet by mouth daily., Disp: , Rfl:  .  cetirizine (ZYRTEC) 10 MG tablet, Take 10 mg by mouth at bedtime as needed for allergies., Disp: , Rfl:  .  famotidine (PEPCID) 40 MG tablet, Take 1 tablet (40 mg total) by mouth 2 (two) times daily., Disp: 180 tablet, Rfl: 3 .  folic acid (FOLVITE) 1 MG tablet, Take 1 mg by mouth daily., Disp: , Rfl: 3 .  HYDROcodone-acetaminophen (NORCO) 10-325 MG tablet, Take 1 tablet by mouth every 6 (six) hours as needed (pain)., Disp: 30 tablet, Rfl: 0 .  losartan (COZAAR) 100 MG tablet, TAKE 1 TABLET (100 MG TOTAL) DAILY BY MOUTH., Disp: 90 tablet, Rfl: 3 .  methotrexate 50 MG/2ML injection, Inject 20 mg into the vein once a week. Dose is 0.8 mL (20mg ). Take on Fridays, Disp: , Rfl:  .  metoprolol succinate (TOPROL-XL) 25 MG 24 hr tablet, TAKE 1 TABLET BY MOUTH EVERY DAY,  Disp: 90 tablet, Rfl: 3 .  NON FORMULARY, 2 liter of oxygen, Disp: , Rfl:  .  potassium chloride SA (KLOR-CON) 20 MEQ tablet, Take 20 mEq by mouth daily., Disp: , Rfl:  .  predniSONE (DELTASONE) 10 MG tablet, Take 1 tablet (10 mg total) by mouth daily., Disp: 10 tablet, Rfl: 0 .  torsemide (DEMADEX) 20 MG tablet, Take 2 tablets (40 mg total) by mouth daily. Take additional 20 mg for weight gain of 3 lbs in 1 day or 5 lbs in 2 days or swelling or shortness of breath., Disp: 90 tablet, Rfl: 0 .  triamcinolone acetonide (KENALOG) 40  MG/ML injection, Inject 40 mg into the muscle every 3 (three) months. For knee pain, Disp: , Rfl:  .  triamcinolone ointment (KENALOG) 0.1 %, Apply 1 application topically 2 (two) times daily. For Sarcoidosis, Disp: , Rfl:  .  vitamin B-12 1000 MCG tablet, Take 1 tablet (1,000 mcg total) by mouth daily., Disp: 30 tablet, Rfl: 0  EXAM:  VITALS per patient if applicable:  GENERAL: alert, oriented, appears well and in no acute distress  HEENT: atraumatic, conjunttiva clear, no obvious abnormalities on inspection of external nose and ears  NECK: normal movements of the head and neck  LUNGS: on inspection no signs of respiratory distress, breathing rate appears normal, no obvious gross SOB, gasping or wheezing  CV: no obvious cyanosis  MS: moves all visible extremities without noticeable abnormality  PSYCH/NEURO: pleasant and cooperative, no obvious depression or anxiety, speech and thought processing grossly intact  ASSESSMENT AND PLAN:  Discussed the following assessment and plan:  1. Acute on chronic diastolic congestive heart failure ([place]) -We reviewed recent hospital admission/discharge notes, imaging, and labs.   -Patient doing well with no complaints today. - She is agreeable to take an extra dose of torsemide if her weight goes up greater than 3 pounds. -Advised low-salt diet - CBC with Differential/Platelet; Future - Basic Metabolic Panel;  Future -Follow-up with cardiology as directed  2. Essential hypertension -No change in medication - CBC with Differential/Platelet; Future - Basic Metabolic Panel; Future  3. OSA (obstructive sleep apnea) -Continue with CPAP  4. Morbid obesity ([place]) -Encouraged weight loss.  5. Iron deficiency anemia due to chronic blood loss -Stable with iron infusions.  Follow-up with hematology as directed - CBC with Differential/Platelet; Future - Basic Metabolic Panel; Future   I discussed the assessment and treatment plan with the patient. The patient was provided an opportunity to ask questions and all were answered. The patient agreed with the plan and demonstrated an understanding of the instructions.   The patient was advised to call back or seek an in-person evaluation if the symptoms worsen or if the condition fails to improve as anticipated.   Kim Peng, NP

## 2019-09-09 ENCOUNTER — Other Ambulatory Visit: Payer: Medicare HMO

## 2019-09-11 DIAGNOSIS — E669 Obesity, unspecified: Secondary | ICD-10-CM | POA: Diagnosis not present

## 2019-09-11 DIAGNOSIS — G4733 Obstructive sleep apnea (adult) (pediatric): Secondary | ICD-10-CM | POA: Diagnosis not present

## 2019-09-14 DIAGNOSIS — Z20828 Contact with and (suspected) exposure to other viral communicable diseases: Secondary | ICD-10-CM | POA: Diagnosis not present

## 2019-09-16 ENCOUNTER — Other Ambulatory Visit: Payer: Medicare HMO

## 2019-10-01 ENCOUNTER — Other Ambulatory Visit: Payer: Self-pay | Admitting: Nurse Practitioner

## 2019-10-01 DIAGNOSIS — Z1231 Encounter for screening mammogram for malignant neoplasm of breast: Secondary | ICD-10-CM

## 2019-10-02 ENCOUNTER — Telehealth: Payer: Medicare HMO | Admitting: Cardiology

## 2019-10-02 NOTE — Progress Notes (Signed)
Virtual Visit via Video Note   This visit type was conducted due to national recommendations for restrictions regarding the COVID-19 Pandemic (e.g. social distancing) in an effort to limit this patient's exposure and mitigate transmission in our community.  Due to her co-morbid illnesses, this patient is at least at moderate risk for complications without adequate follow up.  This format is felt to be most appropriate for this patient at this time.  All issues noted in this document were discussed and addressed.  A limited physical exam was performed with this format.  Please refer to the patient's chart for her consent to telehealth for [facility].   Date:  10/03/2019   ID:  [name], DOB 1966-10-07, MRN [id number]  Patient Location: Home Provider Location: Office  PCP:  [name], NP  Cardiologist:  Candee Furbish, MD  Electrophysiologist:  None   Evaluation Performed:  Follow-Up Visit  Chief Complaint:  Edema   History of Present Illness:    [name] is a 53 y.o. female with heart failure follow up  She has a hx of diastolic heart failure, morbid obesity, hypothyroidism, COPD hypertension, chronic respiratory failure on 2L via [place],  OSA, anemia, iron def., sarcoidosis with March 2019 seen in consultation.  She was hospitalized in July 2020 with HF exacerbation,  On follow up due to lack of urinary output with 120 mg Lasix BID she was decreased to 40 mg BID and extra 40 prn. .   She was readmitted 08/16/19 with increased SOB, CTA of chest no PE + cardiomegaly and fluid overload.  BNP has been low with all acute exacerbations.  Her wt was up 14 lbs from July.  Pt was admitted and diuresed, pt on ARB,  On echo EF 55-60% with moderately increased LVH, mildly elevated pulmonary artery systolic pressure.  With her knee pain she had both knees injected by Dr. Rush Farmer.    At discharge pt on torsemide 40 mg daily and extra 20 prn , she did receive IV feraheme  on 08/20/19.  Wt at discharge 227.3 Kg 500.3 lbs,   08/28/19 visit "she feels much improved.  Her scales are 10 lbs less than hospital and her wt is up only 1 lb.  No pitting edema. She is able to walk around her home without SOB, she could hardly go to [place] prior to hospitalization.  She is passing more urine with the torsemide.  Her knees are much improved as well.  She is stable after Iron infusion and usually receives every 3 months.   She is using her CPAP without issues.  She sees her PCP tomorrow and will have BMP and CBC done there.  Her BP is up some but she has not yet had her morning meds."  Today her wt is up some but no changes in respirations.  She has been under lots of stress with her step daughter with new dx.  And her exposure to COVID which brought whole household under quarantine.   they have been tested and are neg.  Pt is wearing her home oxygen and uses CPAP.  She is still doing well on torsemide.  She is to see oncology tomorrow and have her labs done then.  Her BP is elevated but she has not yet had her morning meds.  She continues to monitor salt intake.    We discussed COVID vaccine, she is not sure she will take at this time.    Pt has no symptoms  of COVID and she tested neg in hospital.  She only goes out for MD appts.  When out she wars a mask.   The patient does not have symptoms concerning for COVID-19 infection (fever, chills, cough, or new shortness of breath).    Past Medical History:  Diagnosis Date  . Anemia   . Arthritis    hands, shoulders, no meds  . CHF (congestive heart failure) (HCC)    EF60-65%  . Chronic hyperventilation syndrome    w/ obesity tx with albuterol inhaler and oxygen 2L  . COPD (chronic obstructive pulmonary disease) ([place])    uses oxygen 2 L  . Gallstones   . GERD (gastroesophageal reflux disease)    diet controlled - no meds  . H/O hiatal hernia   . Hypertension   . Hypothyroidism   . Kidney stones   . Morbid obesity ([place])    . Pneumonia    hoispitalized in 08/2011  . Sarcoidosis   . Seasonal allergies   . Sleep apnea    uses CPAP machine    Past Surgical History:  Procedure Laterality Date  . [place]   x 1  . CHOLECYSTECTOMY  2000  . DILATION AND CURETTAGE OF UTERUS N/A 08/09/2016   Procedure: DILATATION AND CURETTAGE;  Surgeon: Everitt Amber, MD;  Location: [facility];  Service: Gynecology;  Laterality: N/A;  . HYSTEROSCOPY W/D&C  12/27/2011   Procedure: DILATATION AND CURETTAGE /HYSTEROSCOPY;  Surgeon: Maeola Sarah. Landry Mellow, MD;  Location: [place];  Service: Gynecology;;  . I and D of abcess  05/2011  . INTRAUTERINE DEVICE (IUD) INSERTION N/A 08/09/2016   Procedure: INTRAUTERINE DEVICE (IUD) INSERTION;  Surgeon: Everitt Amber, MD;  Location: [facility];  Service: Gynecology;  Laterality: N/A;  . LUNG BIOPSY    . uterine abletion       Current Meds  Medication Sig  . albuterol (PROAIR HFA) 108 (90 Base) MCG/ACT inhaler Inhale 1-2 puffs into the lungs every 6 (six) hours as needed for wheezing or shortness of breath.  Marland Kitchen atorvastatin (LIPITOR) 40 MG tablet Take 1 tablet (40 mg total) by mouth daily at 6 PM.  . calcium citrate-vitamin D (CITRACAL+D) 315-200 MG-UNIT tablet Take 1 tablet by mouth daily.  . cetirizine (ZYRTEC) 10 MG tablet Take 10 mg by mouth at bedtime as needed for allergies.  . famotidine (PEPCID) 40 MG tablet Take 1 tablet (40 mg total) by mouth 2 (two) times daily.  . folic acid (FOLVITE) 1 MG tablet Take 1 mg by mouth daily.  Marland Kitchen HYDROcodone-acetaminophen (NORCO) 10-325 MG tablet Take 1 tablet by mouth every 6 (six) hours as needed (pain).  Marland Kitchen losartan (COZAAR) 100 MG tablet TAKE 1 TABLET (100 MG TOTAL) DAILY BY MOUTH.  . methotrexate 50 MG/2ML injection Inject 20 mg into the vein once a week. Dose is 0.8 mL (20mg ). Take on Fridays  . metoprolol succinate (TOPROL-XL) 25 MG 24 hr tablet TAKE 1 TABLET BY MOUTH EVERY DAY  . NON FORMULARY 2 liter of oxygen  . potassium chloride SA (KLOR-CON) 20 MEQ  tablet Take 20 mEq by mouth daily.  . predniSONE (DELTASONE) 10 MG tablet Take 1 tablet (10 mg total) by mouth daily.  Marland Kitchen torsemide (DEMADEX) 20 MG tablet Take 2 tablets (40 mg total) by mouth daily. Take additional 20 mg for weight gain of 3 lbs in 1 day or 5 lbs in 2 days or swelling or shortness of breath.  . triamcinolone acetonide (KENALOG) 40 MG/ML injection Inject  40 mg into the muscle every 3 (three) months. For knee pain  . triamcinolone ointment (KENALOG) 0.1 % Apply 1 application topically 2 (two) times daily. For Sarcoidosis  . vitamin B-12 1000 MCG tablet Take 1 tablet (1,000 mcg total) by mouth daily.     Allergies:   Tape   Social History   Tobacco Use  . Smoking status: Never Smoker  . Smokeless tobacco: Never Used  Substance Use Topics  . Alcohol use: Yes    Comment: occasionally  . Drug use: No     Family Hx: The patient's family history includes Aneurysm in her sister; Cancer (age of onset: 26) in her father; Deep vein thrombosis in her mother; Diabetes in her brother and father.  ROS:   Please see the history of present illness.    General:no colds or fevers, some weight changes Skin:no rashes or ulcers HEENT:no blurred vision, no congestion CV:see HPI PUL:see HPI GI:no diarrhea constipation or melena, no indigestion GU:no hematuria, no dysuria MS:no joint pain, no claudication Neuro:no syncope, no lightheadedness Endo:no diabetes, no thyroid disease Psych:  Emotional stress with situational issues of family  All other systems reviewed and are negative.   Prior CV studies:   The following studies were reviewed today:  Echo 08/17/19 .  IMPRESSIONS   1. Left ventricular ejection fraction, by visual estimation, is 55 to 60%. The left ventricle has normal function. Normal left ventricular size. There is moderately increased left ventricular hypertrophy. 2. Elevated mean left atrial pressure. 3. Left ventricular diastolic Doppler parameters are  consistent with pseudonormalization pattern of LV diastolic filling. 4. Global right ventricle has normal systolic function.The right ventricular size is normal. No increase in right ventricular wall thickness. 5. Left atrial size was mildly dilated. 6. Right atrial size was not well visualized. 7. The pericardial effusion is posterior to the left ventricle. 8. Trivial pericardial effusion is present. 9. The mitral valve is normal in structure. No evidence of mitral valve regurgitation. 10. The tricuspid valve is normal in structure. Tricuspid valve regurgitation is trivial. 11. The aortic valve is normal in structure. Aortic valve regurgitation is trivial by color flow Doppler. 12. The pulmonic valve was grossly normal. Pulmonic valve regurgitation is not visualized by color flow Doppler. 13. Mildly elevated pulmonary artery systolic pressure. 14. The inferior vena cava is normal in size with greater than 50% respiratory variability, suggesting right atrial pressure of 3 mmHg.  FINDINGS Left Ventricle: Left ventricular ejection fraction, by visual estimation, is 55 to 60%. The left ventricle has normal function. There is moderately increased left ventricular hypertrophy. Concentric left ventricular hypertrophy. Normal left ventricular  size. Spectral Doppler shows Left ventricular diastolic Doppler parameters are consistent with pseudonormalization pattern of LV diastolic filling. Elevated mean left atrial pressure.  Right Ventricle: The right ventricular size is normal. No increase in right ventricular wall thickness. Global RV systolic function is has normal systolic function. The tricuspid regurgitant velocity is 2.88 m/s, and with an assumed right atrial pressure of 3 mmHg, the estimated right ventricular systolic pressure is mildly elevated at 36.2 mmHg.  Left Atrium: Left atrial size was mildly dilated.  Right Atrium: Right atrial size was not well visualized  Pericardium:  Trivial pericardial effusion is present. The pericardial effusion is posterior to the left ventricle.  Mitral Valve: The mitral valve is normal in structure. No evidence of mitral valve regurgitation.  Tricuspid Valve: The tricuspid valve is normal in structure. Tricuspid valve regurgitation is trivial by color flow Doppler.  Aortic Valve: The aortic valve is normal in structure. Aortic valve regurgitation is trivial by color flow Doppler.  Pulmonic Valve: The pulmonic valve was grossly normal. Pulmonic valve regurgitation is not visualized by color flow Doppler.  Aorta: The aortic root and ascending aorta are structurally normal, with no evidence of dilitation.  Venous: The inferior vena cava is normal in size with greater than 50% respiratory variability, suggesting right atrial pressure of 3 mmHg.  IAS/Shunts: No atrial level shunt detected by color flow Doppler.    LEFT VENTRICLE PLAX 2D LVIDd: 4.60 cm Diastology LVIDs: 3.20 cm LV e' lateral: 7.29 cm/s LV PW: 1.60 cm LV E/e' lateral: 11.3 LV IVS: 1.60 cm LV e' medial: 7.18 cm/s LVOT diam: 2.10 cm LV E/e' medial: 11.5 LV SV: 56 ml LV SV Index: 16.38 LVOT Area: 3.46 cm   RIGHT VENTRICLE RV Basal diam: 2.70 cm RV S prime: 14.60 cm/s TAPSE (M-mode): 2.1 cm  LEFT ATRIUM Index LA diam: 3.50 cm 1.19 cm/m LA Vol (A2C): 37.9 ml 12.85 ml/m LA Vol (A4C): 72.2 ml 24.47 ml/m LA Biplane Vol: 54.0 ml 18.30 ml/m AORTIC VALVE LVOT Vmax: 111.00 cm/s LVOT Vmean: 71.050 cm/s LVOT VTI: 0.200 m  AORTA Ao Root diam: 2.90 cm Ao Asc diam: 2.60 cm  MITRAL VALVE TRICUSPID VALVE MV Area (PHT): 3.42 cm TR Peak grad: 33.2 mmHg MV PHT: 64.38 msec TR Vmax: 288.00 cm/s MV Decel Time: 222 msec MV E velocity: 82.30 cm/s 103 cm/s SHUNTS MV A velocity: 60.40 cm/s 70.3  cm/s Systemic VTI: 0.20 m MV E/A ratio: 1.36 1.5 Systemic Diam: 2.10 cm   Labs/Other Tests and Data Reviewed:    EKG:  No ECG reviewed. no new since last visit   Recent Labs: 08/16/2019: B Natriuretic Peptide 40.6 08/17/2019: ALT 14; TSH 3.082 08/22/2019: BUN 35; Creatinine, Ser 1.12; Hemoglobin 11.4; Magnesium 1.9; Platelets 242; Potassium 4.2; Sodium 138   Recent Lipid Panel Lab Results  Component Value Date/Time   CHOL 241 (H) 05/08/2019 08:11 AM   TRIG 125.0 05/08/2019 08:11 AM   HDL 58.80 05/08/2019 08:11 AM   CHOLHDL 4 05/08/2019 08:11 AM   LDLCALC 157 (H) 05/08/2019 08:11 AM    Wt Readings from Last 3 Encounters:  10/03/19 (!) 498 lb (225.9 kg)  08/28/19 (!) 491 lb (222.7 kg)  08/22/19 (!) 493 lb 11.2 oz (223.9 kg)     Objective:    Vital Signs:  BP (!) 147/92   Pulse 88   Ht 5' 5.5" (1.664 m)   Wt (!) 498 lb (225.9 kg)   LMP 09/19/2019   BMI 81.61 kg/m    VITAL SIGNS:  reviewed  General female in NAD with oxygen cannula in place Pulmonary can speak in complete sentences without SOB Neuro A&O X 3 MAE follows commands.  ASSESSMENT & PLAN:    1. Chronic diastolic HF has been stable on torsemide, still wit good urine output. She has some wt gain but no SOB more than her usual.  She follows her salt.  To see oncology and have labs this week  2.  OSA on CPAP and uses  3.  Iron def anemia followed by oncology with iron infusions.  4.  Sarcoidosis  per pulmonary on methotrexate and prednisone   5.  Chronic hypoxia on home oxygen per pulmonary   COVID-19 Education: The signs and symptoms of COVID-19 were discussed with the patient and how to seek care for testing (follow up with PCP or arrange E-visit).  The importance  of social distancing was discussed today.  Time:   Today, I have spent 10 minutes with the patient with telehealth technology discussing the above problems.     Medication Adjustments/Labs and Tests Ordered: Current  medicines are reviewed at length with the patient today.  Concerns regarding medicines are outlined above.   Tests Ordered: No orders of the defined types were placed in this encounter.   Medication Changes: No orders of the defined types were placed in this encounter.   Follow Up:  In Person in 3 month(s)  Signed, Cecilie Kicks, NP  10/03/2019 9:22 AM    [place]

## 2019-10-03 ENCOUNTER — Other Ambulatory Visit: Payer: Self-pay

## 2019-10-03 ENCOUNTER — Telehealth (INDEPENDENT_AMBULATORY_CARE_PROVIDER_SITE_OTHER): Payer: Medicare HMO | Admitting: Cardiology

## 2019-10-03 ENCOUNTER — Encounter: Payer: Self-pay | Admitting: Cardiology

## 2019-10-03 ENCOUNTER — Telehealth: Payer: Self-pay | Admitting: Hematology

## 2019-10-03 VITALS — BP 147/92 | HR 88 | Ht 65.5 in | Wt >= 6400 oz

## 2019-10-03 DIAGNOSIS — I5032 Chronic diastolic (congestive) heart failure: Secondary | ICD-10-CM

## 2019-10-03 DIAGNOSIS — J9611 Chronic respiratory failure with hypoxia: Secondary | ICD-10-CM

## 2019-10-03 DIAGNOSIS — G4733 Obstructive sleep apnea (adult) (pediatric): Secondary | ICD-10-CM

## 2019-10-03 DIAGNOSIS — Z9989 Dependence on other enabling machines and devices: Secondary | ICD-10-CM

## 2019-10-03 DIAGNOSIS — D509 Iron deficiency anemia, unspecified: Secondary | ICD-10-CM

## 2019-10-03 NOTE — Telephone Encounter (Signed)
Returned patient's phone call regarding rescheduling 12/04 appointment, per patient's request appointment has moved to 12/09.

## 2019-10-03 NOTE — Patient Instructions (Addendum)
Medication Instructions:  Your physician recommends that you continue on your current medications as directed. Please refer to the Current Medication list given to you today.  *If you need a refill on your cardiac medications before your next appointment, please call your pharmacy*  Lab Work: None ordered  If you have labs (blood work) drawn today and your tests are completely normal, you will receive your results only by: Marland Kitchen MyChart Message (if you have MyChart) OR . A paper copy in the mail If you have any lab test that is abnormal or we need to change your treatment, we will call you to review the results.  Testing/Procedures: None ordered  Follow-Up: At [facility], you and your health needs are our priority.  As part of our continuing mission to provide you with exceptional heart care, we have created designated Provider Care Teams.  These Care Teams include your primary Cardiologist (physician) and Advanced Practice Providers (APPs -  Physician Assistants and Nurse Practitioners) who all work together to provide you with the care you need, when you need it.  Your next appointment:   4 month(s)   01/24/2020 ARRIVE AT 8:45 FOR REGISTRATION TO SEE DR. [name] IN OFFICE   The format for your next appointment:   In Person  Provider:   Candee Furbish, MD  Other Instructions

## 2019-10-04 ENCOUNTER — Inpatient Hospital Stay: Payer: Medicare HMO

## 2019-10-09 ENCOUNTER — Inpatient Hospital Stay: Payer: Medicare HMO | Attending: Hematology

## 2019-10-09 ENCOUNTER — Other Ambulatory Visit: Payer: Self-pay

## 2019-10-09 DIAGNOSIS — D500 Iron deficiency anemia secondary to blood loss (chronic): Secondary | ICD-10-CM

## 2019-10-09 LAB — IRON AND TIBC
Iron: 27 ug/dL — ABNORMAL LOW (ref 41–142)
Saturation Ratios: 9 % — ABNORMAL LOW (ref 21–57)
TIBC: 296 ug/dL (ref 236–444)
UIBC: 269 ug/dL (ref 120–384)

## 2019-10-09 LAB — CBC WITH DIFFERENTIAL (CANCER CENTER ONLY)
Abs Immature Granulocytes: 0.08 10*3/uL — ABNORMAL HIGH (ref 0.00–0.07)
Basophils Absolute: 0 10*3/uL (ref 0.0–0.1)
Basophils Relative: 0 %
Eosinophils Absolute: 0 10*3/uL (ref 0.0–0.5)
Eosinophils Relative: 0 %
HCT: 39.4 % (ref 36.0–46.0)
Hemoglobin: 12 g/dL (ref 12.0–15.0)
Immature Granulocytes: 1 %
Lymphocytes Relative: 4 %
Lymphs Abs: 0.4 10*3/uL — ABNORMAL LOW (ref 0.7–4.0)
MCH: 24.6 pg — ABNORMAL LOW (ref 26.0–34.0)
MCHC: 30.5 g/dL (ref 30.0–36.0)
MCV: 80.9 fL (ref 80.0–100.0)
Monocytes Absolute: 0.1 10*3/uL (ref 0.1–1.0)
Monocytes Relative: 1 %
Neutro Abs: 9.2 10*3/uL — ABNORMAL HIGH (ref 1.7–7.7)
Neutrophils Relative %: 94 %
Platelet Count: 239 10*3/uL (ref 150–400)
RBC: 4.87 MIL/uL (ref 3.87–5.11)
RDW: 21.5 % — ABNORMAL HIGH (ref 11.5–15.5)
WBC Count: 9.8 10*3/uL (ref 4.0–10.5)
nRBC: 0 % (ref 0.0–0.2)

## 2019-10-09 LAB — RETICULOCYTES
Immature Retic Fract: 25 % — ABNORMAL HIGH (ref 2.3–15.9)
RBC.: 4.89 MIL/uL (ref 3.87–5.11)
Retic Count, Absolute: 49.4 10*3/uL (ref 19.0–186.0)
Retic Ct Pct: 1 % (ref 0.4–3.1)

## 2019-10-09 LAB — FERRITIN: Ferritin: 48 ng/mL (ref 11–307)

## 2019-10-11 DIAGNOSIS — G4733 Obstructive sleep apnea (adult) (pediatric): Secondary | ICD-10-CM | POA: Diagnosis not present

## 2019-10-11 DIAGNOSIS — E669 Obesity, unspecified: Secondary | ICD-10-CM | POA: Diagnosis not present

## 2019-10-15 DIAGNOSIS — Z79899 Other long term (current) drug therapy: Secondary | ICD-10-CM | POA: Diagnosis not present

## 2019-10-15 DIAGNOSIS — M064 Inflammatory polyarthropathy: Secondary | ICD-10-CM | POA: Diagnosis not present

## 2019-10-15 DIAGNOSIS — E662 Morbid (severe) obesity with alveolar hypoventilation: Secondary | ICD-10-CM | POA: Diagnosis not present

## 2019-10-15 DIAGNOSIS — M159 Polyosteoarthritis, unspecified: Secondary | ICD-10-CM | POA: Diagnosis not present

## 2019-10-15 DIAGNOSIS — D860 Sarcoidosis of lung: Secondary | ICD-10-CM | POA: Diagnosis not present

## 2019-10-16 ENCOUNTER — Telehealth: Payer: Self-pay | Admitting: *Deleted

## 2019-10-16 NOTE — Telephone Encounter (Signed)
Records faxed to [facility] - Release [id number]

## 2019-10-21 ENCOUNTER — Telehealth: Payer: Self-pay

## 2019-10-21 NOTE — Telephone Encounter (Signed)
-----   Message from Truitt Merle, MD sent at 10/21/2019 10:50 AM EST ----- Please let pt know her lab results, ferritin level (47) slightly below goal (50), no anemia, she got iv feraheme 2 months ago, OK to give iv feraheme in the next few weeks or with next lab in 2 months, please discuss with her and set up infusion appointment, thanks   Truitt Merle  10/21/2019

## 2019-10-21 NOTE — Telephone Encounter (Signed)
Spoke with patient regarding lab results.  Per Dr. Burr Medico ferritin level 47 slightly below goal of 50, no anemia.  Offered patient IV feraheme infusion within a couple of weeks or in 2 months when she comes for repeat labs.  Patient prefers to add to her appointment in 2 months on 2/4.   She denies symptoms at this time.  Scheduling message was sent.

## 2019-10-23 ENCOUNTER — Other Ambulatory Visit: Payer: Self-pay | Admitting: Adult Health

## 2019-10-23 DIAGNOSIS — E785 Hyperlipidemia, unspecified: Secondary | ICD-10-CM

## 2019-11-06 ENCOUNTER — Ambulatory Visit (INDEPENDENT_AMBULATORY_CARE_PROVIDER_SITE_OTHER): Payer: Medicare Other

## 2019-11-06 DIAGNOSIS — Z1231 Encounter for screening mammogram for malignant neoplasm of breast: Secondary | ICD-10-CM | POA: Diagnosis not present

## 2019-11-06 DIAGNOSIS — Z0000 Encounter for general adult medical examination without abnormal findings: Secondary | ICD-10-CM

## 2019-11-06 NOTE — Patient Instructions (Addendum)
[name] , Thank you for taking time to participate in your Medicare Wellness Visit. I appreciate your ongoing commitment to your health goals. Please review the following plan we discussed and let me know if I can assist you in the future.   Screening recommendations/referrals: Colorectal Screening: Patient continues to decline a colonoscopy. She has a cologuard kit at home and stated she would complete that. Mammogram: Never had. Order sent to [facility] today as patient agreed. Bone Density: patient is 54 years old and still menstruating. Not yet.  Vision and Dental Exams: Recommended annual ophthalmology exams for early detection of glaucoma and other disorders of the eye. She is preparing to have cataract surgery when pandemic settles.  Recommended annual dental exams for proper oral hygiene  Diabetic Exams: Diabetic Eye Exam: N/A Diabetic Foot Exam: N/A  Vaccinations: Influenza vaccine: completed 08/19/2019.  Pneumococcal vaccine: completed 08/19/2019. Tdap vaccine: expired; patient wishes to wait on updating at this time.  Shingles vaccine: Please call your pharmacy to determine your out of pocket expense for the Shingrix vaccine. You may receive this vaccine at your local pharmacy. This is a series of two injections that are to be given 2-6 months apart.   Advanced directives: Advance directives discussed with you today.  Please bring a copy of your POA (Power of [place]) and/or Living Will to your next appointment.  Goals: Continue to drink at least 6-8  8oz glasses of water per day. Recommend to remove any items from the home that may cause slips or trips.   Next appointment: Please schedule your Annual Wellness Visit with your Nurse Health Advisor in one year.  Preventive Care 40-64 Years, Female Preventive care refers to lifestyle choices and visits with your health care provider that can promote health and wellness. What does preventive care include?  A  yearly physical exam. This is also called an annual well check.  Dental exams once or twice a year.  Routine eye exams. Ask your health care provider how often you should have your eyes checked.  Personal lifestyle choices, including:  Daily care of your teeth and gums.  Regular physical activity.  Eating a healthy diet.  Avoiding tobacco and drug use.  Limiting alcohol use.  Practicing safe sex.  Taking low-dose aspirin daily starting at age 68 if recommended by your health care provider.  Taking vitamin and mineral supplements as recommended by your health care provider. What happens during an annual well check? The services and screenings done by your health care provider during your annual well check will depend on your age, overall health, lifestyle risk factors, and family history of disease. Counseling  Your health care provider may ask you questions about your:  Alcohol use.  Tobacco use.  Drug use.  Emotional well-being.  Home and relationship well-being.  Sexual activity.  Eating habits.  Work and work Statistician.  Method of birth control.  Menstrual cycle.  Pregnancy history. Screening  You may have the following tests or measurements:  Height, weight, and BMI.  Blood pressure.  Lipid and cholesterol levels. These may be checked every 5 years, or more frequently if you are over 55 years old.  Skin check.  Lung cancer screening. You may have this screening every year starting at age 43 if you have a 30-pack-year history of smoking and currently smoke or have quit within the past 15 years.  Fecal occult blood test (FOBT) of the stool. You may have this test every year starting at  age 39.  Flexible sigmoidoscopy or colonoscopy. You may have a sigmoidoscopy every 5 years or a colonoscopy every 10 years starting at age 40.  Hepatitis C blood test.  Hepatitis B blood test.  Sexually transmitted disease (STD) testing.  Diabetes screening.  This is done by checking your blood sugar (glucose) after you have not eaten for a while (fasting). You may have this done every 1-3 years.  Mammogram. This may be done every 1-2 years. Talk to your health care provider about when you should start having regular mammograms. This may depend on whether you have a family history of breast cancer.  BRCA-related cancer screening. This may be done if you have a family history of breast, ovarian, tubal, or peritoneal cancers.  Pelvic exam and Pap test. This may be done every 3 years starting at age 68. Starting at age 75, this may be done every 5 years if you have a Pap test in combination with an HPV test.  Bone density scan. This is done to screen for osteoporosis. You may have this scan if you are at high risk for osteoporosis. Discuss your test results, treatment options, and if necessary, the need for more tests with your health care provider. Vaccines  Your health care provider may recommend certain vaccines, such as:  Influenza vaccine. This is recommended every year.  Tetanus, diphtheria, and acellular pertussis (Tdap, Td) vaccine. You may need a Td booster every 10 years.  Zoster vaccine. You may need this after age 82.  Pneumococcal 13-valent conjugate (PCV13) vaccine. You may need this if you have certain conditions and were not previously vaccinated.  Pneumococcal polysaccharide (PPSV23) vaccine. You may need one or two doses if you smoke cigarettes or if you have certain conditions. Talk to your health care provider about which screenings and vaccines you need and how often you need them. This information is not intended to replace advice given to you by your health care provider. Make sure you discuss any questions you have with your health care provider. Document Released: 11/13/2015 Document Revised: 07/06/2016 Document Reviewed: 08/18/2015 Elsevier Interactive Patient Education  2017 [place] Prevention in the  Home Falls can cause injuries. They can happen to people of all ages. There are many things you can do to make your home safe and to help prevent falls. What can I do on the outside of my home?  Regularly fix the edges of walkways and driveways and fix any cracks.  Remove anything that might make you trip as you walk through a door, such as a raised step or threshold.  Trim any bushes or trees on the path to your home.  Use bright outdoor lighting.  Clear any walking paths of anything that might make someone trip, such as rocks or tools.  Regularly check to see if handrails are loose or broken. Make sure that both sides of any steps have handrails.  Any raised decks and porches should have guardrails on the edges.  Have any leaves, snow, or ice cleared regularly.  Use sand or salt on walking paths during winter.  Clean up any spills in your garage right away. This includes oil or grease spills. What can I do in the bathroom?  Use night lights.  Install grab bars by the toilet and in the tub and shower. Do not use towel bars as grab bars.  Use non-skid mats or decals in the tub or shower.  If you need to sit down  in the shower, use a plastic, non-slip stool.  Keep the floor dry. Clean up any water that spills on the floor as soon as it happens.  Remove soap buildup in the tub or shower regularly.  Attach bath mats securely with double-sided non-slip rug tape.  Do not have throw rugs and other things on the floor that can make you trip. What can I do in the bedroom?  Use night lights.  Make sure that you have a light by your bed that is easy to reach.  Do not use any sheets or blankets that are too big for your bed. They should not hang down onto the floor.  Have a firm chair that has side arms. You can use this for support while you get dressed.  Do not have throw rugs and other things on the floor that can make you trip. What can I do in the kitchen?  Clean up any  spills right away.  Avoid walking on wet floors.  Keep items that you use a lot in easy-to-reach places.  If you need to reach something above you, use a strong step stool that has a grab bar.  Keep electrical cords out of the way.  Do not use floor polish or wax that makes floors slippery. If you must use wax, use non-skid floor wax.  Do not have throw rugs and other things on the floor that can make you trip. What can I do with my stairs?  Do not leave any items on the stairs.  Make sure that there are handrails on both sides of the stairs and use them. Fix handrails that are broken or loose. Make sure that handrails are as long as the stairways.  Check any carpeting to make sure that it is firmly attached to the stairs. Fix any carpet that is loose or worn.  Avoid having throw rugs at the top or bottom of the stairs. If you do have throw rugs, attach them to the floor with carpet tape.  Make sure that you have a light switch at the top of the stairs and the bottom of the stairs. If you do not have them, ask someone to add them for you. What else can I do to help prevent falls?  Wear shoes that:  Do not have high heels.  Have rubber bottoms.  Are comfortable and fit you well.  Are closed at the toe. Do not wear sandals.  If you use a stepladder:  Make sure that it is fully opened. Do not climb a closed stepladder.  Make sure that both sides of the stepladder are locked into place.  Ask someone to hold it for you, if possible.  Clearly mark and make sure that you can see:  Any grab bars or handrails.  First and last steps.  Where the edge of each step is.  Use tools that help you move around (mobility aids) if they are needed. These include:  Canes.  Walkers.  Scooters.  Crutches.  Turn on the lights when you go into a dark area. Replace any light bulbs as soon as they burn out.  Set up your furniture so you have a clear path. Avoid moving your  furniture around.  If any of your floors are uneven, fix them.  If there are any pets around you, be aware of where they are.  Review your medicines with your doctor. Some medicines can make you feel dizzy. This can increase your chance of falling.  Ask your doctor what other things that you can do to help prevent falls. This information is not intended to replace advice given to you by your health care provider. Make sure you discuss any questions you have with your health care provider. Document Released: 08/13/2009 Document Revised: 03/24/2016 Document Reviewed: 11/21/2014 Elsevier Interactive Patient Education  2017 [employer].

## 2019-11-06 NOTE — Progress Notes (Signed)
This visit is being conducted via phone call due to the COVID-19 pandemic. This patient has given me verbal consent via phone to conduct this visit, patient states they are participating from their home address. Some vital signs may be absent or patient reported.   Patient identification: identified by name, DOB, and current address.  Location provider: [facility], Office Persons participating in the virtual visit: Ms. Penni Penado and Franne Forts, LPN.     Subjective:   [name] is a 54 y.o. female who presents for Medicare Annual (Subsequent) preventive examination.  Ms. Knoche is stable at present. She is on continuous oxygen 2L and is able to ambulate independently but will occasionally use a walker if she's experiencing joint pain. She reports eating two meals daily due to problems with reflux. We discussed a trial of eating multiple small meals. She does try to avoid fatty meats because they worsen her reflux symptoms. She reports that she is eating a large amount of fresh fruits and vegetables at this time because a family member is on a diet. Lastly, she reports that she is not able to perform any physical exercise.   Review of Systems:   Cardiac Risk Factors include: advanced age (>39mn, >>69women);sedentary lifestyle;obesity (BMI >30kg/m2);dyslipidemia;hypertension     Objective:     Vitals: BP 137/88   Ht 5' 6"  (1.676 m)   Wt (!) 498 lb (225.9 kg)   BMI 80.38 kg/m   Body mass index is 80.38 kg/m.  Advanced Directives 11/06/2019 08/17/2019 05/08/2019 10/27/2018 10/26/2018 05/04/2018 04/17/2018  Does Patient Have a Medical Advance Directive? Yes No No - No Yes Yes  Type of Advance Directive Living will - - - -Public librarianLiving will HQuitmanLiving will  Does patient want to make changes to medical advance directive? Yes (ED - Information included in AVS) - - - - No - Patient declined No - Patient declined  Copy of  [place] Chart? - - - - - Yes Yes  Would patient like information on creating a medical advance directive? - No - Patient declined No - Patient declined No - Patient declined - - -  Pre-existing out of facility DNR order (yellow form or pink MOST form) - - - - - - -    Tobacco Social History   Tobacco Use  Smoking Status Never Smoker  Smokeless Tobacco Never Used     Counseling given: Not Answered   Clinical Intake:  Pre-visit preparation completed: Yes        BMI - recorded: 80.38 Nutritional Status: BMI > 30  Obese Nutritional Risks: Unintentional weight gain Diabetes: No  How often do you need to have someone help you when you read instructions, pamphlets, or other written materials from your doctor or pharmacy?: 1 - Never What is the last grade level you completed in school?: Master's Degree  Interpreter Needed?: No  Information entered by :: CFranne Forts LPN.  Past Medical History:  Diagnosis Date  . Anemia   . Arthritis    hands, shoulders, no meds  . CHF (congestive heart failure) (HCC)    EF60-65%  . Chronic hyperventilation syndrome    w/ obesity tx with albuterol inhaler and oxygen 2L  . COPD (chronic obstructive pulmonary disease) ([place]    uses oxygen 2 L  . Gallstones   . GERD (gastroesophageal reflux disease)    diet controlled - no meds  . H/O hiatal hernia   .  Hypertension   . Hypothyroidism   . Kidney stones   . Morbid obesity ([place])   . Pneumonia    hoispitalized in 08/2011  . Sarcoidosis   . Seasonal allergies   . Sleep apnea    uses CPAP machine    Past Surgical History:  Procedure Laterality Date  . [place]   x 1  . CHOLECYSTECTOMY  2000  . DILATION AND CURETTAGE OF UTERUS N/A 08/09/2016   Procedure: DILATATION AND CURETTAGE;  Surgeon: Everitt Amber, MD;  Location: [facility];  Service: Gynecology;  Laterality: N/A;  . HYSTEROSCOPY WITH D & C  12/27/2011   Procedure: DILATATION AND CURETTAGE  /HYSTEROSCOPY;  Surgeon: Maeola Sarah. Landry Mellow, MD;  Location: [facility];  Service: Gynecology;;  . I and D of abcess  05/2011  . INTRAUTERINE DEVICE (IUD) INSERTION N/A 08/09/2016   Procedure: INTRAUTERINE DEVICE (IUD) INSERTION;  Surgeon: Everitt Amber, MD;  Location: [facility];  Service: Gynecology;  Laterality: N/A;  . LUNG BIOPSY    . uterine abletion     Family History  Problem Relation Age of Onset  . Diabetes Father   . Cancer Father 66       colon cancer   . Diabetes Brother   . Deep vein thrombosis Mother   . Aneurysm Sister        d/o brain aneurysm   Social History   Socioeconomic History  . Marital status: Single    Spouse name: Not on file  . Number of children: 1  . Years of education: 87  . Highest education level: Bachelor's degree (e.g., BA, AB, BS)  Occupational History  . Occupation: disability   Tobacco Use  . Smoking status: Never Smoker  . Smokeless tobacco: Never Used  Substance and Sexual Activity  . Alcohol use: Yes    Comment: occasionally  . Drug use: No  . Sexual activity: Never    Birth control/protection: None  Other Topics Concern  . Not on file  Social History Narrative   Son lives with patient.   Divorced for eight or nine years   disabled   Social Determinants of Radio broadcast assistant Strain: Low Risk   . Difficulty of Paying Living Expenses: Not very hard  Food Insecurity: No Food Insecurity  . Worried About Charity fundraiser in the Last Year: Never true  . Ran Out of Food in the Last Year: Never true  Transportation Needs: No Transportation Needs  . Lack of Transportation (Medical): No  . Lack of Transportation (Non-Medical): No  Physical Activity: Inactive  . Days of Exercise per Week: 0 days  . Minutes of Exercise per Session: 0 min  Stress: No Stress Concern Present  . Feeling of Stress : Only a little  Social Connections: Unknown  . Frequency of Communication with Friends and Family: More than three times a week  . Frequency of  Social Gatherings with Friends and Family: More than three times a week  . Attends Religious Services: More than 4 times per year  . Active Member of Clubs or Organizations: Yes  . Attends Archivist Meetings: More than 4 times per year  . Marital Status: Not on file    Outpatient Encounter Medications as of 11/06/2019  Medication Sig  . albuterol (PROAIR HFA) 108 (90 Base) MCG/ACT inhaler Inhale 1-2 puffs into the lungs every 6 (six) hours as needed for wheezing or shortness of breath.  Marland Kitchen atorvastatin (LIPITOR) 40 MG tablet  TAKE 1 TABLET (40 MG TOTAL) BY MOUTH DAILY AT 6 PM.  . calcium citrate-vitamin D (CITRACAL+D) 315-200 MG-UNIT tablet Take 1 tablet by mouth daily.  . cetirizine (ZYRTEC) 10 MG tablet Take 10 mg by mouth at bedtime as needed for allergies.  . folic acid (FOLVITE) 1 MG tablet Take 1 mg by mouth daily.  Marland Kitchen HYDROcodone-acetaminophen (NORCO) 10-325 MG tablet Take 1 tablet by mouth every 6 (six) hours as needed (pain).  Marland Kitchen losartan (COZAAR) 100 MG tablet TAKE 1 TABLET (100 MG TOTAL) DAILY BY MOUTH.  . methotrexate 50 MG/2ML injection Inject 20 mg into the vein once a week. Dose is 0.8 mL (41m). Take on Fridays  . metoprolol succinate (TOPROL-XL) 25 MG 24 hr tablet TAKE 1 TABLET BY MOUTH EVERY DAY  . NON FORMULARY 2 liter of oxygen  . potassium chloride SA (KLOR-CON) 20 MEQ tablet Take 20 mEq by mouth daily.  . predniSONE (DELTASONE) 10 MG tablet Take 1 tablet (10 mg total) by mouth daily.  .Marland Kitchentorsemide (DEMADEX) 20 MG tablet Take 2 tablets (40 mg total) by mouth daily. Take additional 20 mg for weight gain of 3 lbs in 1 day or 5 lbs in 2 days or swelling or shortness of breath.  . triamcinolone acetonide (KENALOG) 40 MG/ML injection Inject 40 mg into the muscle every 3 (three) months. For knee pain  . triamcinolone ointment (KENALOG) 0.1 % Apply 1 application topically 2 (two) times daily. For Sarcoidosis  . vitamin B-12 1000 MCG tablet Take 1 tablet (1,000 mcg total) by  mouth daily.  . famotidine (PEPCID) 40 MG tablet Take 1 tablet (40 mg total) by mouth 2 (two) times daily.   No facility-administered encounter medications on file as of 11/06/2019.    Activities of Daily Living In your present state of health, do you have any difficulty performing the following activities: 11/06/2019 08/17/2019  Hearing? N N  Vision? N N  Difficulty concentrating or making decisions? N N  Walking or climbing stairs? Y Y  Comment - D/T her size  Dressing or bathing? N N  Doing errands, shopping? N N  Preparing Food and eating ? N -  Using the Toilet? N -  In the past six months, have you accidently leaked urine? Y -  Do you have problems with loss of bowel control? N -  Managing your Medications? N -  Managing your Finances? N -  Housekeeping or managing your Housekeeping? N -  Some recent data might be hidden    Patient Care Team: NDorothyann Peng NP as PCP - General (Family Medicine) SJerline Pain MD as PCP - Cardiology (Cardiology) GWonda Horner MD (Gastroenterology) ARigoberto Noel MD as Consulting Physician (Pulmonary Disease) HGavin Pound MD as Consulting Physician (Rheumatology)    Assessment:   This is a routine wellness examination for Jaymi.  Exercise Activities and Dietary recommendations Current Exercise Habits: The patient does not participate in regular exercise at present, Exercise limited by: cardiac condition(s);respiratory conditions(s)  Goals   None     Fall Risk Fall Risk  11/06/2019 05/04/2018 02/24/2016 02/24/2016 02/24/2016  Falls in the past year? 0 No No No No  Risk for fall due to : Medication side effect - - - -   Is the patient's home free of loose throw rugs in walkways, pet beds, electrical cords, etc?   yes      Grab bars in the bathroom? yes      Handrails on the stairs?  yes      Adequate lighting?   yes  Timed Get Up and Go performed: N/A due to telephone visit.   Depression Screen PHQ 2/9 Scores 11/06/2019  05/04/2018 02/24/2016 02/24/2016  PHQ - 2 Score 0 0 0 0     Cognitive Function     6CIT Screen 11/06/2019  What Year? 0 points  What month? 0 points  What time? 0 points  Count back from 20 0 points  Months in reverse 0 points  Repeat phrase 0 points  Total Score 0    Immunization History  Administered Date(s) Administered  . Influenza Split 08/22/2011  . Influenza Whole 07/31/2012  . Influenza,inj,Quad PF,6+ Mos 09/16/2013, 07/11/2014, 07/11/2015, 09/04/2017, 11/07/2018, 08/19/2019  . Influenza-Unspecified 08/18/2016  . Pneumococcal Polysaccharide-23 08/19/2019  . Pneumococcal-Unspecified 08/01/2011    Qualifies for Shingles Vaccine? Yes and she will inquire about cost from pharmacy since that is most affordable option.   Screening Tests Health Maintenance  Topic Date Due  . TETANUS/TDAP  10/02/1984  . PAP SMEAR-Modifier  12/26/2014  . MAMMOGRAM  10/03/2015  . COLONOSCOPY  10/03/2015  . HIV Screening  Completed    Cancer Screenings: Lung: Low Dose CT Chest recommended if Age 4-80 years, 30 pack-year currently smoking OR have quit w/in 15years. Patient does not qualify. Breast:  Up to date on Mammogram? No , patient agreed to do and order was [facility] to [facility].  Up to date of Bone Density/Dexa? N/A at this time. Patient is age 25 and still menstruating regularly. Colorectal: No; she declines a colonoscopy but has a cologuard kit at home and states she will completed this.   Additional Screenings:  Hepatitis C Screening: completed 10/27/2018.     Plan:   Ms. Coco stated she would completed the cologuard kit that she has at home and also agreed to having a mammogram. This order was sent to [facility] today. Discussed updating Tdap and obtaining Shingrix vaccines from pharmacy. Because these are not covered by insurance, I am not sure that she is able to have those administered.    I have personally reviewed and noted the following in the patient's chart:    . Medical and social history . Use of alcohol, tobacco or illicit drugs  . Current medications and supplements . Functional ability and status . Nutritional status . Physical activity . Advanced directives . List of other physicians . Hospitalizations, surgeries, and ER visits in previous 12 months . Vitals . Screenings to include cognitive, depression, and falls . Referrals and appointments  In addition, I have reviewed and discussed with patient certain preventive protocols, quality metrics, and best practice recommendations. A written personalized care plan for preventive services as well as general preventive health recommendations were provided to patient.     Franne Forts, LPN  02/06/7025

## 2019-11-11 DIAGNOSIS — G4733 Obstructive sleep apnea (adult) (pediatric): Secondary | ICD-10-CM | POA: Diagnosis not present

## 2019-11-29 NOTE — Progress Notes (Signed)
[place]   Telephone:(336) [phone] Fax:(336) [phone]   Clinic Follow up Note   Patient Care Team: [name], NP as PCP - General (Family Medicine) Jerline Pain, MD as PCP - Cardiology (Cardiology) Wonda Horner, MD (Gastroenterology) Rigoberto Noel, MD as Consulting Physician (Pulmonary Disease) Gavin Pound, MD as Consulting Physician (Rheumatology)  Date of Service:  12/05/2019  CHIEF COMPLAINT: F/u anemia  CURRENT THERAPY:  iv Feraheme 510mg  as needed  INTERVAL HISTORY:  [name] is here for a follow up of anemia. She was last seen by me 6 months ago. She presents to the clinic alone. She notes she is stable. She notes after her IV iron last year she felt mildly better. She notes around that time she had more fluid retention and was SOB. She notes she was changed to [facility] during a hospital stay. She notes she is on Prednisone 10mg  for RA and Sarcoidosis. She has been on Prednisone for 3 years.  She notes her periods are not as heavy but lasting 9 days, once a month. She notes she is interested in surgery given her family's late menopausal start. She denies black stool or bleeding.     REVIEW OF SYSTEMS:   Constitutional: Denies fevers, chills or abnormal weight loss Eyes: Denies blurriness of vision Ears, nose, mouth, throat, and face: Denies mucositis or sore throat Respiratory: Denies cough, dyspnea or wheezes Cardiovascular: Denies palpitation, chest discomfort (+) Body swelling Gastrointestinal:  Denies nausea, heartburn or change in bowel habits Skin: Denies abnormal skin rashes MSK: (+) RA Lymphatics: Denies new lymphadenopathy or easy bruising Neurological:Denies numbness, tingling or new weaknesses Behavioral/Psych: Mood is stable, no new changes  All other systems were reviewed with the patient and are negative.  MEDICAL HISTORY:  Past Medical History:  Diagnosis Date  . Anemia   . Arthritis    hands, shoulders, no  meds  . CHF (congestive heart failure) (HCC)    EF60-65%  . Chronic hyperventilation syndrome    w/ obesity tx with albuterol inhaler and oxygen 2L  . COPD (chronic obstructive pulmonary disease) ([place])    uses oxygen 2 L  . Gallstones   . GERD (gastroesophageal reflux disease)    diet controlled - no meds  . H/O hiatal hernia   . Hypertension   . Hypothyroidism   . Kidney stones   . Morbid obesity ([place])   . Pneumonia    hoispitalized in 08/2011  . Sarcoidosis   . Seasonal allergies   . Sleep apnea    uses CPAP machine     SURGICAL HISTORY: Past Surgical History:  Procedure Laterality Date  . [place]   x 1  . CHOLECYSTECTOMY  2000  . DILATION AND CURETTAGE OF UTERUS N/A 08/09/2016   Procedure: DILATATION AND CURETTAGE;  Surgeon: Everitt Amber, MD;  Location: [facility];  Service: Gynecology;  Laterality: N/A;  . HYSTEROSCOPY WITH D & C  12/27/2011   Procedure: DILATATION AND CURETTAGE /HYSTEROSCOPY;  Surgeon: Maeola Sarah. Landry Mellow, MD;  Location: [place] [facility];  Service: Gynecology;;  . I and D of abcess  05/2011  . INTRAUTERINE DEVICE (IUD) INSERTION N/A 08/09/2016   Procedure: INTRAUTERINE DEVICE (IUD) INSERTION;  Surgeon: Everitt Amber, MD;  Location: [facility];  Service: Gynecology;  Laterality: N/A;  . LUNG BIOPSY    . uterine abletion      I have reviewed the social history and family history with the patient and they are unchanged from previous  note.  ALLERGIES:  is allergic to tape.  MEDICATIONS:  Current Outpatient Medications  Medication Sig Dispense Refill  . albuterol (PROAIR HFA) 108 (90 Base) MCG/ACT inhaler Inhale 1-2 puffs into the lungs every 6 (six) hours as needed for wheezing or shortness of breath.    Marland Kitchen atorvastatin (LIPITOR) 40 MG tablet TAKE 1 TABLET (40 MG TOTAL) BY MOUTH DAILY AT 6 PM. 90 tablet 0  . B-D TB SYRINGE 1CC/27GX1/2" 27G X 1/2" 1 ML MISC USE ONCE A WEEK TO INJECT METHOTREXATE    . calcium citrate-vitamin D (CITRACAL+D) 315-200 MG-UNIT tablet Take 1  tablet by mouth daily.    . cetirizine (ZYRTEC) 10 MG tablet Take 10 mg by mouth at bedtime as needed for allergies.    . famotidine (PEPCID) 40 MG tablet Take 1 tablet (40 mg total) by mouth 2 (two) times daily. [id number] tablet 3  . folic acid (FOLVITE) 1 MG tablet Take 1 mg by mouth daily.  3  . HYDROcodone-acetaminophen (NORCO) 10-325 MG tablet Take 1 tablet by mouth every 6 (six) hours as needed (pain). 30 tablet 0  . losartan (COZAAR) 100 MG tablet TAKE 1 TABLET (100 MG TOTAL) DAILY BY MOUTH. 90 tablet 3  . methotrexate 50 MG/2ML injection Inject 20 mg into the vein once a week. Dose is 0.8 mL (20mg ). Take on Fridays    . metoprolol succinate (TOPROL-XL) 25 MG 24 hr tablet TAKE 1 TABLET BY MOUTH EVERY DAY 90 tablet 3  . NON FORMULARY 2 liter of oxygen    . potassium chloride SA (KLOR-CON) 20 MEQ tablet Take 20 mEq by mouth daily.    . predniSONE (DELTASONE) 10 MG tablet Take 1 tablet (10 mg total) by mouth daily. 10 tablet 0  . torsemide (DEMADEX) 20 MG tablet Take 2 tablets (40 mg total) by mouth daily. Take additional 20 mg for weight gain of 3 lbs in 1 day or 5 lbs in 2 days or swelling or shortness of breath. 90 tablet 0  . triamcinolone acetonide (KENALOG) 40 MG/ML injection Inject 40 mg into the muscle every 3 (three) months. For knee pain    . triamcinolone ointment (KENALOG) 0.1 % Apply 1 application topically 2 (two) times daily. For Sarcoidosis    . vitamin B-12 1000 MCG tablet Take 1 tablet (1,000 mcg total) by mouth daily. 30 tablet 0   No current facility-administered medications for this visit.    PHYSICAL EXAMINATION:  Vitals:   12/05/19 1049  BP: (!) 168/68  Pulse: 81  Resp: 18  Temp: 98.1 F (36.7 C)  SpO2: 92%   Filed Weights   12/05/19 1049  Weight: (!) 507 lb 3.2 oz (230.1 kg)    GENERAL:alert, no distress and comfortable SKIN: skin color, texture, turgor are normal, no rashes or significant lesions EYES: normal, Conjunctiva are pink and non-injected, sclera  clear  NECK: supple, thyroid normal size, non-tender, without nodularity LYMPH:  no palpable lymphadenopathy in the cervical, axillary  LUNGS: clear to auscultation and percussion with normal breathing effort HEART: regular rate & rhythm and no murmurs (+) lower extremity edema ABDOMEN:abdomen soft, non-tender and normal bowel sounds Musculoskeletal:no cyanosis of digits and no clubbing  NEURO: alert & oriented x 3 with fluent speech, no focal motor/sensory deficits  LABORATORY DATA:  I have reviewed the data as listed CBC Latest Ref Rng & Units 12/05/2019 10/09/2019 08/22/2019  WBC 4.0 - 10.5 K/uL 11.5(H) 9.8 13.0(H)  Hemoglobin 12.0 - 15.0 g/dL 10.8(L) 12.0 11.4(L)  Hematocrit  36.0 - 46.0 % 37.7 39.4 39.2  Platelets 150 - 400 K/uL 224 239 242     CMP Latest Ref Rng & Units 08/22/2019 08/21/2019 08/20/2019  Glucose 70 - 99 mg/dL 127(H) 119(H) 128(H)  BUN 6 - 20 mg/dL 35(H) 34(H) 33(H)  Creatinine 0.44 - 1.00 mg/dL 1.12(H) 1.11(H) 1.25(H)  Sodium 135 - 145 mmol/L 138 138 138  Potassium 3.5 - 5.1 mmol/L 4.2 4.1 4.6  Chloride 98 - 111 mmol/L 92(L) 92(L) 96(L)  CO2 22 - 32 mmol/L 28 32 31  Calcium 8.9 - 10.3 mg/dL 9.1 9.2 8.8(L)  Total Protein 6.5 - 8.1 g/dL - - -  Total Bilirubin 0.3 - 1.2 mg/dL - - -  Alkaline Phos 38 - 126 U/L - - -  AST 15 - 41 U/L - - -  ALT 0 - 44 U/L - - -      RADIOGRAPHIC STUDIES: I have personally reviewed the radiological images as listed and agreed with the findings in the report. No results found.   ASSESSMENT & PLAN:  [name] is a 54 y.o. female with    1. Iron deficient anemia from Menorrhagia -Her multiple iron studies previouslyshowed very low level of ferritin, serum iron level and transferrin saturation, her MCV is low, consistent with iron deficient anemia. -Her iron deficiency is likely from her menorrhagia -she has tried IUD, but did not help much and it was removed, her menorrhagia has improved some since then  -She is  not very compliant with oral iron supplement, responded well to IV iron. She has required IV iron every 2-4 monthsinthe past few years. Last on 08/20/19.  -Per pt her periods are lighter but longer now. Labs reviewed, she has mild anemia again, will proceed with IV Feraheme today. If ferritin low, will give second dose next week  -Will continue to monitor with Lab every3 months, iv iron if ferritin<50 or low iron level.  -f/u in 9 months    2. Hypertension, pulmonary sarcomatosis, congestive heart failure  -F/u with PCP -Admitted to the ED 04/16/18-04/20/18 for SOB, 10/26/2018 for SOB 10/19/2018 -She was also admitted to ED 08/16/19-08/22/19 for SOB and acute on chronic heart failure. She was started on Demadex.  -Uses CPAP -She is still on long term use of Prednisone for her Sarcoidosis and RA. Currently on 10mg .  -On Lasix   3. Morbid obesity  -We have repeatedly discussed weight loss and I recommend her to see weight management clinic, she declined previously.    Plan -Will proceed with IV Feraheme today, and will schedule second dose next week if needed  -Labs every 3 months,will set up IV iron if ferritin less than 50 or low iron level. Will coincide labs with Dr [name]  -F/u with me in 9 months   No problem-specific Assessment & Plan notes found for this encounter.   No orders of the defined types were placed in this encounter.  All questions were answered. The patient knows to call the clinic with any problems, questions or concerns. No barriers to learning was detected. The total time spent in the appointment was 20 minutes.     Truitt Merle, MD 12/05/2019   I, Joslyn Devon, am acting as scribe for Truitt Merle, MD.   I have reviewed the above documentation for accuracy and completeness, and I agree with the above.

## 2019-12-05 ENCOUNTER — Encounter: Payer: Self-pay | Admitting: Hematology

## 2019-12-05 ENCOUNTER — Inpatient Hospital Stay (HOSPITAL_BASED_OUTPATIENT_CLINIC_OR_DEPARTMENT_OTHER): Payer: Medicare Other | Admitting: Hematology

## 2019-12-05 ENCOUNTER — Inpatient Hospital Stay: Payer: Medicare Other

## 2019-12-05 ENCOUNTER — Inpatient Hospital Stay: Payer: Medicare Other | Attending: Hematology

## 2019-12-05 ENCOUNTER — Other Ambulatory Visit: Payer: Self-pay

## 2019-12-05 VITALS — BP 168/68 | HR 81 | Temp 98.1°F | Resp 18 | Ht 66.0 in | Wt >= 6400 oz

## 2019-12-05 VITALS — BP 132/70 | HR 75 | Resp 18

## 2019-12-05 DIAGNOSIS — N920 Excessive and frequent menstruation with regular cycle: Secondary | ICD-10-CM | POA: Diagnosis present

## 2019-12-05 DIAGNOSIS — Z79899 Other long term (current) drug therapy: Secondary | ICD-10-CM | POA: Diagnosis not present

## 2019-12-05 DIAGNOSIS — I509 Heart failure, unspecified: Secondary | ICD-10-CM | POA: Insufficient documentation

## 2019-12-05 DIAGNOSIS — Z7952 Long term (current) use of systemic steroids: Secondary | ICD-10-CM | POA: Insufficient documentation

## 2019-12-05 DIAGNOSIS — G4730 Sleep apnea, unspecified: Secondary | ICD-10-CM | POA: Insufficient documentation

## 2019-12-05 DIAGNOSIS — E039 Hypothyroidism, unspecified: Secondary | ICD-10-CM | POA: Diagnosis not present

## 2019-12-05 DIAGNOSIS — J449 Chronic obstructive pulmonary disease, unspecified: Secondary | ICD-10-CM | POA: Diagnosis not present

## 2019-12-05 DIAGNOSIS — D500 Iron deficiency anemia secondary to blood loss (chronic): Secondary | ICD-10-CM

## 2019-12-05 DIAGNOSIS — D869 Sarcoidosis, unspecified: Secondary | ICD-10-CM | POA: Diagnosis not present

## 2019-12-05 DIAGNOSIS — I110 Hypertensive heart disease with heart failure: Secondary | ICD-10-CM | POA: Insufficient documentation

## 2019-12-05 LAB — CBC WITH DIFFERENTIAL (CANCER CENTER ONLY)
Abs Immature Granulocytes: 0.05 10*3/uL (ref 0.00–0.07)
Basophils Absolute: 0 10*3/uL (ref 0.0–0.1)
Basophils Relative: 0 %
Eosinophils Absolute: 0 10*3/uL (ref 0.0–0.5)
Eosinophils Relative: 0 %
HCT: 37.7 % (ref 36.0–46.0)
Hemoglobin: 10.8 g/dL — ABNORMAL LOW (ref 12.0–15.0)
Immature Granulocytes: 0 %
Lymphocytes Relative: 4 %
Lymphs Abs: 0.5 10*3/uL — ABNORMAL LOW (ref 0.7–4.0)
MCH: 22.5 pg — ABNORMAL LOW (ref 26.0–34.0)
MCHC: 28.6 g/dL — ABNORMAL LOW (ref 30.0–36.0)
MCV: 78.7 fL — ABNORMAL LOW (ref 80.0–100.0)
Monocytes Absolute: 0.3 10*3/uL (ref 0.1–1.0)
Monocytes Relative: 3 %
Neutro Abs: 10.6 10*3/uL — ABNORMAL HIGH (ref 1.7–7.7)
Neutrophils Relative %: 93 %
Platelet Count: 224 10*3/uL (ref 150–400)
RBC: 4.79 MIL/uL (ref 3.87–5.11)
RDW: 20.9 % — ABNORMAL HIGH (ref 11.5–15.5)
WBC Count: 11.5 10*3/uL — ABNORMAL HIGH (ref 4.0–10.5)
nRBC: 0 % (ref 0.0–0.2)

## 2019-12-05 LAB — RETICULOCYTES
Immature Retic Fract: 14.2 % (ref 2.3–15.9)
RBC.: 4.55 MIL/uL (ref 3.87–5.11)
Retic Count, Absolute: 59.5 10*3/uL (ref 19.0–186.0)
Retic Ct Pct: 1.3 % (ref 0.4–3.1)

## 2019-12-05 LAB — FERRITIN: Ferritin: 17 ng/mL (ref 11–307)

## 2019-12-05 LAB — IRON AND TIBC
Iron: 19 ug/dL — ABNORMAL LOW (ref 41–142)
Saturation Ratios: 5 % — ABNORMAL LOW (ref 21–57)
TIBC: 343 ug/dL (ref 236–444)
UIBC: 324 ug/dL (ref 120–384)

## 2019-12-05 MED ORDER — SODIUM CHLORIDE 0.9 % IV SOLN
510.0000 mg | Freq: Once | INTRAVENOUS | Status: AC
  Administered 2019-12-05: 510 mg via INTRAVENOUS
  Filled 2019-12-05: qty 510

## 2019-12-05 MED ORDER — SODIUM CHLORIDE 0.9 % IV SOLN
INTRAVENOUS | Status: DC
  Filled 2019-12-05: qty 250

## 2019-12-05 NOTE — Patient Instructions (Signed)

## 2019-12-06 ENCOUNTER — Telehealth: Payer: Self-pay | Admitting: Hematology

## 2019-12-06 NOTE — Telephone Encounter (Signed)
Scheduled appt per 2/4 los.  Sent a message to HIM pool to get a calendar mailed out. 

## 2019-12-11 ENCOUNTER — Telehealth: Payer: Self-pay | Admitting: Hematology

## 2019-12-11 NOTE — Telephone Encounter (Signed)
Scheduled appt per 2/6 sch message - unable to reach pt - left message with apt date and time

## 2019-12-12 DIAGNOSIS — G4733 Obstructive sleep apnea (adult) (pediatric): Secondary | ICD-10-CM | POA: Diagnosis not present

## 2019-12-13 DIAGNOSIS — G4733 Obstructive sleep apnea (adult) (pediatric): Secondary | ICD-10-CM | POA: Diagnosis not present

## 2019-12-20 ENCOUNTER — Ambulatory Visit: Payer: Medicare Other

## 2020-01-08 ENCOUNTER — Other Ambulatory Visit: Payer: Self-pay | Admitting: Adult Health

## 2020-01-20 ENCOUNTER — Telehealth: Payer: Self-pay | Admitting: Cardiology

## 2020-01-20 NOTE — Telephone Encounter (Signed)
Spoke with patient who reports she is physically unable to come into the office 1) because of knee pain 2) lack of transportation.  Advised her appt can be changed to a VV.  She is aware to have VS and medications available at least 15 mins before her appt time.  Pt was grateful for the ability to change the appt otherwise she states she would have had to cancel it all together.

## 2020-01-20 NOTE — Telephone Encounter (Signed)
New message   Patient would like to know if the office visit with Dr. [name] can be changed to a virtual visit. Please call to discuss.

## 2020-01-24 ENCOUNTER — Encounter: Payer: Self-pay | Admitting: Cardiology

## 2020-01-24 ENCOUNTER — Other Ambulatory Visit: Payer: Self-pay

## 2020-01-24 ENCOUNTER — Telehealth (INDEPENDENT_AMBULATORY_CARE_PROVIDER_SITE_OTHER): Payer: Medicare Other | Admitting: Cardiology

## 2020-01-24 VITALS — BP 159/79 | HR 77 | Ht 66.0 in | Wt >= 6400 oz

## 2020-01-24 DIAGNOSIS — Z6841 Body Mass Index (BMI) 40.0 and over, adult: Secondary | ICD-10-CM

## 2020-01-24 DIAGNOSIS — J9611 Chronic respiratory failure with hypoxia: Secondary | ICD-10-CM | POA: Diagnosis not present

## 2020-01-24 DIAGNOSIS — D509 Iron deficiency anemia, unspecified: Secondary | ICD-10-CM

## 2020-01-24 DIAGNOSIS — D869 Sarcoidosis, unspecified: Secondary | ICD-10-CM

## 2020-01-24 DIAGNOSIS — I5032 Chronic diastolic (congestive) heart failure: Secondary | ICD-10-CM

## 2020-01-24 DIAGNOSIS — G4733 Obstructive sleep apnea (adult) (pediatric): Secondary | ICD-10-CM

## 2020-01-24 DIAGNOSIS — I110 Hypertensive heart disease with heart failure: Secondary | ICD-10-CM

## 2020-01-24 DIAGNOSIS — Z9989 Dependence on other enabling machines and devices: Secondary | ICD-10-CM

## 2020-01-24 DIAGNOSIS — Z9981 Dependence on supplemental oxygen: Secondary | ICD-10-CM

## 2020-01-24 DIAGNOSIS — Z7189 Other specified counseling: Secondary | ICD-10-CM

## 2020-01-24 NOTE — Progress Notes (Signed)
Virtual Visit via Video Note   This visit type was conducted due to national recommendations for restrictions regarding the COVID-19 Pandemic (e.g. social distancing) in an effort to limit this patient's exposure and mitigate transmission in our community.  Due to her co-morbid illnesses, this patient is at least at moderate risk for complications without adequate follow up.  This format is felt to be most appropriate for this patient at this time.  All issues noted in this document were discussed and addressed.  A limited physical exam was performed with this format.  Please refer to the patient's chart for her consent to telehealth for [facility].   The patient was identified using 2 identifiers.  Date:  01/24/2020   ID:  [name], DOB 1966-04-16, MRN [id number]  Patient Location: Home Provider Location: Office  PCP:  [name], NP  Cardiologist:  Candee Furbish, MD  Electrophysiologist:  None   Evaluation Performed:  Follow-Up Visit  Chief Complaint: Follow-up morbid obesity diastolic heart failure  History of Present Illness:    [name] is a 54 y.o. female here for the follow-up of diastolic heart failure super morbid obesity COPD chronic respiratory failure on 2 L obstructive sleep apnea obesity hypoventilation syndrome with possible sarcoidosis.  Back hospitalized in July 2020, diuresed.  Lasix was changed.  Readmitted again in October 2020 with increased shortness of breath.  No PE on CTA.  EF has been 55 to 60% with moderate LVH and mildly elevated pulmonary artery systolic pressures.  She was switched to torsemide at that time and felt improvement.  More urine output.  She has been under more stress with her daughter.  The patient does not have symptoms concerning for COVID-19 infection (fever, chills, cough, or new shortness of breath).    Past Medical History:  Diagnosis Date  . Anemia   . Arthritis    hands, shoulders, no meds  . CHF  (congestive heart failure) (HCC)    EF60-65%  . Chronic hyperventilation syndrome    w/ obesity tx with albuterol inhaler and oxygen 2L  . COPD (chronic obstructive pulmonary disease) ([place])    uses oxygen 2 L  . Gallstones   . GERD (gastroesophageal reflux disease)    diet controlled - no meds  . H/O hiatal hernia   . Hypertension   . Hypothyroidism   . Kidney stones   . Morbid obesity ([place])   . Pneumonia    hoispitalized in 08/2011  . Sarcoidosis   . Seasonal allergies   . Sleep apnea    uses CPAP machine    Past Surgical History:  Procedure Laterality Date  . [place]   x 1  . CHOLECYSTECTOMY  2000  . DILATION AND CURETTAGE OF UTERUS N/A 08/09/2016   Procedure: DILATATION AND CURETTAGE;  Surgeon: Everitt Amber, MD;  Location: [facility];  Service: Gynecology;  Laterality: N/A;  . HYSTEROSCOPY WITH D & C  12/27/2011   Procedure: DILATATION AND CURETTAGE /HYSTEROSCOPY;  Surgeon: Maeola Sarah. Landry Mellow, MD;  Location: [facility];  Service: Gynecology;;  . I and D of abcess  05/2011  . INTRAUTERINE DEVICE (IUD) INSERTION N/A 08/09/2016   Procedure: INTRAUTERINE DEVICE (IUD) INSERTION;  Surgeon: Everitt Amber, MD;  Location: [facility];  Service: Gynecology;  Laterality: N/A;  . LUNG BIOPSY    . uterine abletion       Current Meds  Medication Sig  . albuterol (PROAIR HFA) 108 (90 Base) MCG/ACT inhaler Inhale 1-2  puffs into the lungs every 6 (six) hours as needed for wheezing or shortness of breath.  Marland Kitchen atorvastatin (LIPITOR) 40 MG tablet TAKE 1 TABLET (40 MG TOTAL) BY MOUTH DAILY AT 6 PM.  . B-D TB SYRINGE 1CC/27GX1/2" 27G X 1/2" 1 ML MISC USE ONCE A WEEK TO INJECT METHOTREXATE  . calcium citrate-vitamin D (CITRACAL+D) 315-200 MG-UNIT tablet Take 1 tablet by mouth daily.  . cetirizine (ZYRTEC) 10 MG tablet Take 10 mg by mouth at bedtime as needed for allergies.  . famotidine (PEPCID) 40 MG tablet Take 1 tablet (40 mg total) by mouth 2 (two) times daily.  . folic acid (FOLVITE) 1 MG tablet  Take 1 mg by mouth daily.  Marland Kitchen HYDROcodone-acetaminophen (NORCO) 10-325 MG tablet Take 1 tablet by mouth every 6 (six) hours as needed (pain).  Marland Kitchen losartan (COZAAR) 100 MG tablet TAKE 1 TABLET (100 MG TOTAL) DAILY BY MOUTH.  . methotrexate 50 MG/2ML injection Inject 20 mg into the vein once a week. Dose is 0.8 mL (20mg ). Take on Fridays  . metoprolol succinate (TOPROL-XL) 25 MG 24 hr tablet TAKE 1 TABLET BY MOUTH EVERY DAY  . NON FORMULARY 2 liter of oxygen  . potassium chloride SA (KLOR-CON) 20 MEQ tablet Take 20 mEq by mouth daily.  . predniSONE (DELTASONE) 10 MG tablet Take 1 tablet (10 mg total) by mouth daily.  Marland Kitchen torsemide (DEMADEX) 20 MG tablet Take 2 tablets (40 mg total) by mouth daily. Take additional 20 mg for weight gain of 3 lbs in 1 day or 5 lbs in 2 days or swelling or shortness of breath.  . triamcinolone acetonide (KENALOG) 40 MG/ML injection Inject 40 mg into the muscle every 3 (three) months. For knee pain  . triamcinolone ointment (KENALOG) 0.1 % Apply 1 application topically 2 (two) times daily. For Sarcoidosis  . vitamin B-12 1000 MCG tablet Take 1 tablet (1,000 mcg total) by mouth daily.     Allergies:   Tape   Social History   Tobacco Use  . Smoking status: Never Smoker  . Smokeless tobacco: Never Used  Substance Use Topics  . Alcohol use: Yes    Comment: occasionally  . Drug use: No     Family Hx: The patient's family history includes Aneurysm in her sister; Cancer (age of onset: 71) in her father; Deep vein thrombosis in her mother; Diabetes in her brother and father.  ROS:   Please see the history of present illness.    Still having dyspnea on exertion, no bleeding All other systems reviewed and are negative.   Prior CV studies:   The following studies were reviewed today:  Echo 08/17/2019-EF 60% to pulmonary pressures 36 mmHg  Labs/Other Tests and Data Reviewed:    EKG:  An ECG dated 08/16/2019 was personally reviewed today and demonstrated:  NSR  90  Recent Labs: 08/16/2019: B Natriuretic Peptide 40.6 08/17/2019: ALT 14; TSH 3.082 08/22/2019: BUN 35; Creatinine, Ser 1.12; Magnesium 1.9; Potassium 4.2; Sodium 138 12/05/2019: Hemoglobin 10.8; Platelet Count 224   Recent Lipid Panel Lab Results  Component Value Date/Time   CHOL 241 (H) 05/08/2019 08:11 AM   TRIG 125.0 05/08/2019 08:11 AM   HDL 58.80 05/08/2019 08:11 AM   CHOLHDL 4 05/08/2019 08:11 AM   LDLCALC 157 (H) 05/08/2019 08:11 AM    Wt Readings from Last 3 Encounters:  01/24/20 (!) 508 lb (230.4 kg)  12/05/19 (!) 507 lb 3.2 oz (230.1 kg)  11/06/19 (!) 498 lb (225.9 kg)  Objective:    Vital Signs:  BP (!) 159/79   Pulse 77   Ht 5\' 6"  (1.676 m)   Wt (!) 508 lb (230.4 kg)   SpO2 98%   BMI 81.99 kg/m    VITAL SIGNS:  reviewed GEN:  no acute distress EYES:  sclerae anicteric, EOMI - Extraocular Movements Intact RESPIRATORY:  normal respiratory effort, symmetric expansion SKIN:  no rash, lesions or ulcers. MUSCULOSKELETAL:  no obvious deformities. NEURO:  alert and oriented x 3, no obvious focal deficit PSYCH:  normal affect  ASSESSMENT & PLAN:    Chronic diastolic heart failure -Multifactorial from morbid obesity mostly.  Continue to work on weight gain.  Follow salt restrictions.  Torsemide 40 mg a day. -Last creatinine 1.1.  She does see Dr. Lenna Gilford for her rheumatology visit.  I did ask her if she would not mind checking a basic metabolic profile for [place] to maintain observation on her kidney function and potassium. -Since she has had some weight gain recently, I think it would be a good idea for her to take some additional furosemide this weekend.  She is at liberty to do so.  Understands instructions.  Sometimes going to the bathroom can be tough for her.  Obstructive sleep apnea -CPAP, compliant, home O2  Iron deficiency anemia -Followed by oncology, iron infusions.  Sarcoidosis -Pulmonary is following on methotrexate and prednisone.  Chronic  hypoxia -Home oxygen.  COVID-19 Education: The signs and symptoms of COVID-19 were discussed with the patient and how to seek care for testing (follow up with PCP or arrange E-visit).  The importance of social distancing was discussed today.  Time:   Today, I have spent 15 minutes with the patient with telehealth technology discussing the above problems.     Medication Adjustments/Labs and Tests Ordered: Current medicines are reviewed at length with the patient today.  Concerns regarding medicines are outlined above.   Tests Ordered: No orders of the defined types were placed in this encounter.   Medication Changes: No orders of the defined types were placed in this encounter.   Follow Up:  Virtual Visit  in 4 month(s)  Signed, Candee Furbish, MD  01/24/2020 10:22 AM    [facility]

## 2020-01-24 NOTE — Patient Instructions (Signed)
Medication Instructions:  The current medical regimen is effective;  continue present plan and medications.  *If you need a refill on your cardiac medications before your next appointment, please call your pharmacy*  Follow-Up: At [facility], you and your health needs are our priority.  As part of our continuing mission to provide you with exceptional heart care, we have created designated Provider Care Teams.  These Care Teams include your primary Cardiologist (physician) and Advanced Practice Providers (APPs -  Physician Assistants and Nurse Practitioners) who all work together to provide you with the care you need, when you need it.  We recommend signing up for the patient portal called "MyChart".  Sign up information is provided on this After Visit Summary.  MyChart is used to connect with patients for Virtual Visits (Telemedicine).  Patients are able to view lab/test results, encounter notes, upcoming appointments, etc.  Non-urgent messages can be sent to your provider as well.   To learn more about what you can do with MyChart, go to [email or web address].    Your next appointment:   4 month(s)  The format for your next appointment:   Virtual Visit   Provider:   Cecilie Kicks, NP and Dr [name] in person in 8 months. You will receive a letter to call the office to schedule when you are due for these visits.   Thank you for choosing [facility]!!

## 2020-01-29 ENCOUNTER — Other Ambulatory Visit: Payer: Self-pay | Admitting: Adult Health

## 2020-01-29 DIAGNOSIS — E785 Hyperlipidemia, unspecified: Secondary | ICD-10-CM

## 2020-01-30 DIAGNOSIS — G4733 Obstructive sleep apnea (adult) (pediatric): Secondary | ICD-10-CM | POA: Diagnosis not present

## 2020-02-01 IMAGING — CT CT ANGIO CHEST
2 of 6 series · 18 of 36 positions shown · IV contrast (ISOVUE 370)
Comparison: 01/15/2018

CLINICAL DATA: Increasing shortness of breath for 3 days. Recent
admission to have fluid taken off. History of hypertension,
congestive heart failure, COPD, gastroesophageal reflux disease,
pneumonia, sarcoidosis, lung biopsy.

EXAM:
CT ANGIOGRAPHY CHEST WITH CONTRAST
TECHNIQUE: Multidetector CT imaging of the chest was performed using the
standard protocol during bolus administration of intravenous
contrast. Multiplanar CT image reconstructions and MIPs were
obtained to evaluate the vascular anatomy.
CONTRAST:  100mL 28PD7R-6TO IOPAMIDOL (28PD7R-6TO) INJECTION 76%

[Series 5: thins · axial · 0.64mm/px · z∈[+1605,+1829]mm · 17 of 250 slices shown]
[im 13/250  lung]
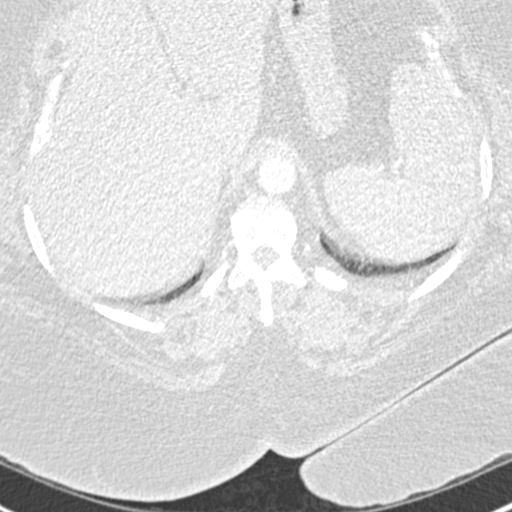
[im 25/250  mediastinal]
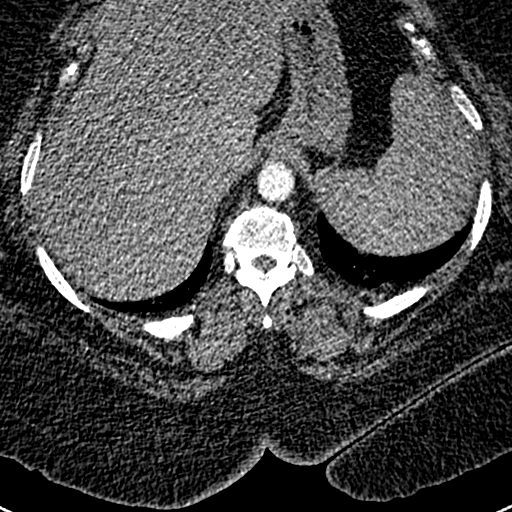
[im 38/250  lung]
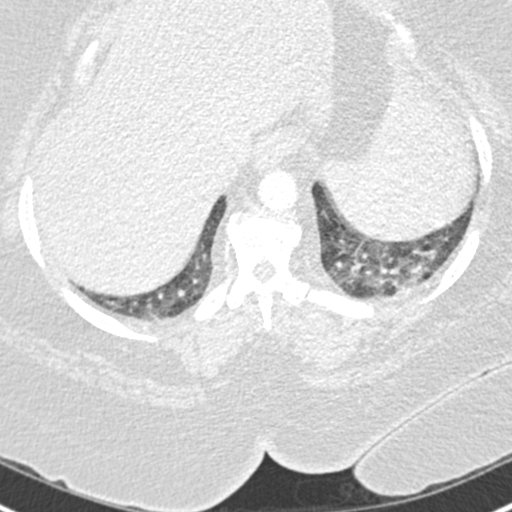
[im 50/250  mediastinal]
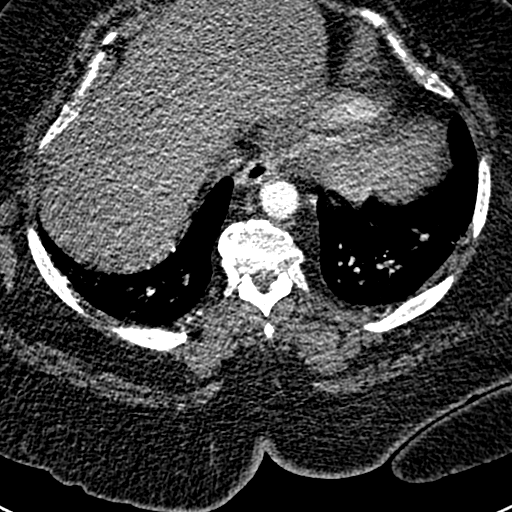
[im 75/250  lung]
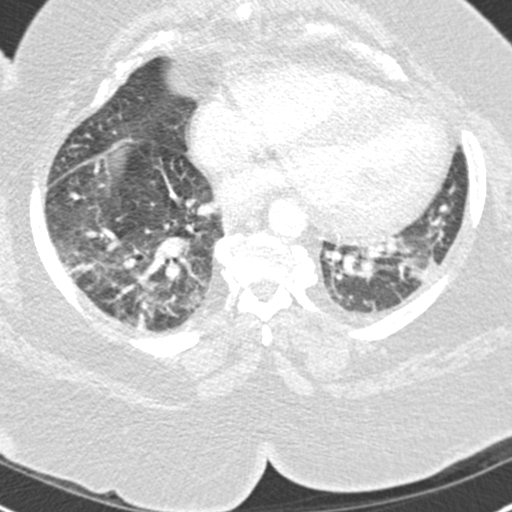
[im 88/250  mediastinal]
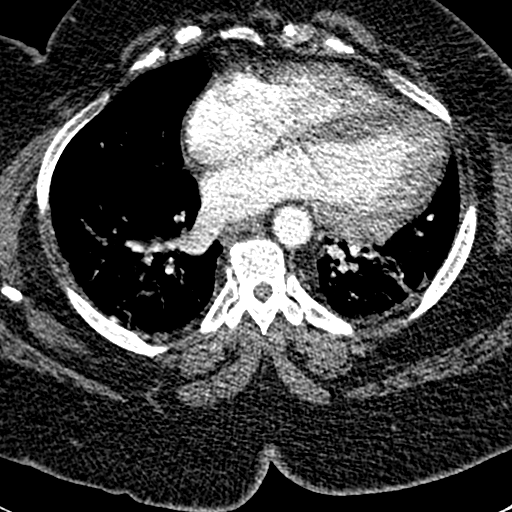
[im 100/250  lung]
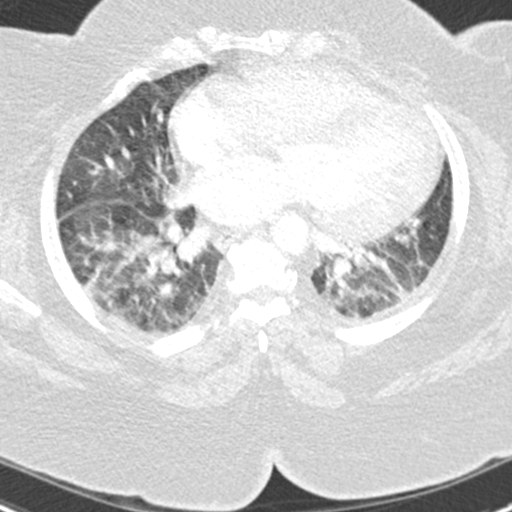
[im 113/250  mediastinal]
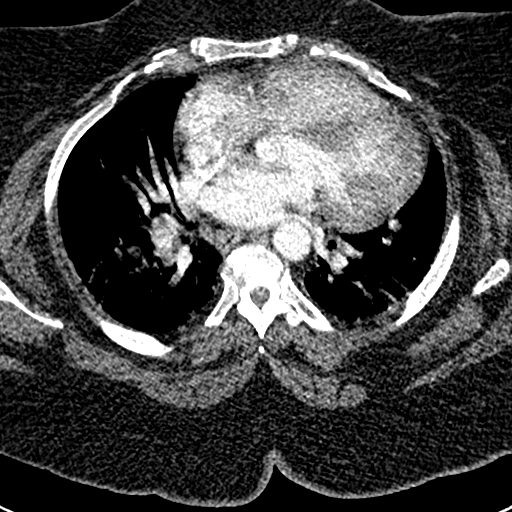
[im 125/250  lung]
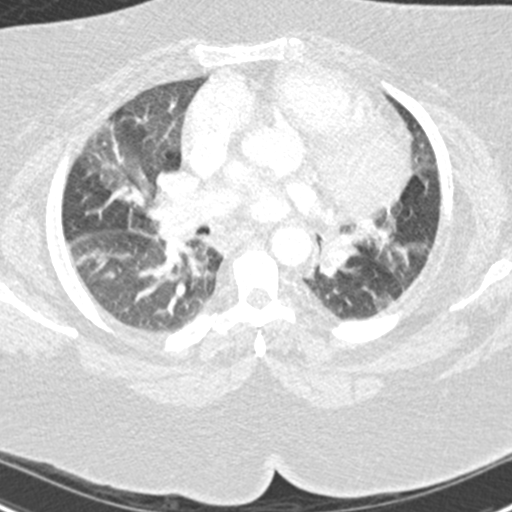
[im 137/250  mediastinal]
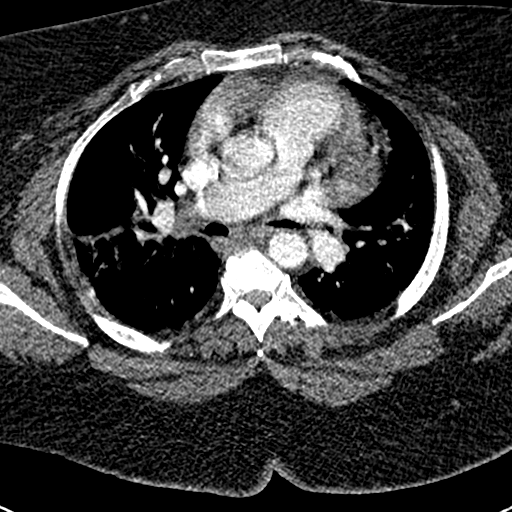
[im 150/250  lung]
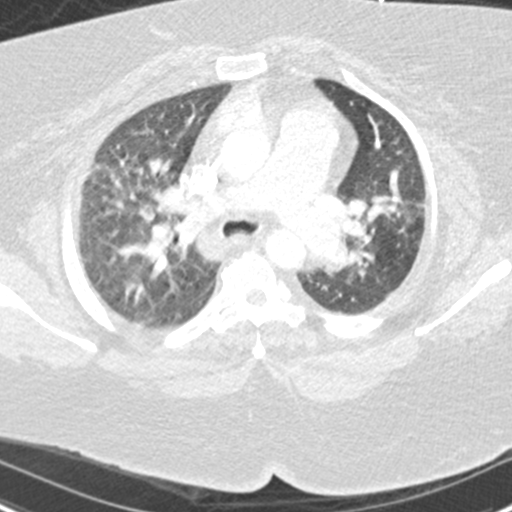
[im 162/250  mediastinal]
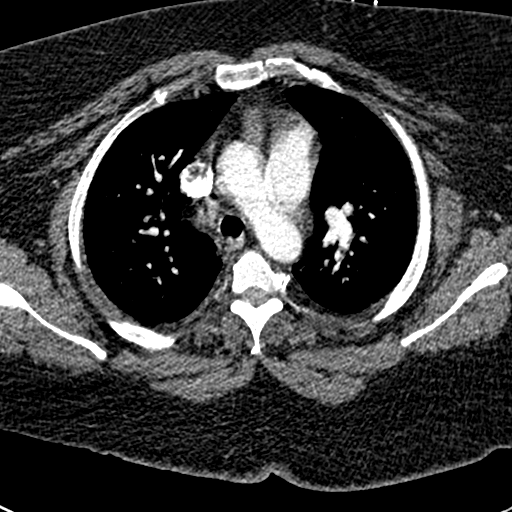
[im 175/250  lung]
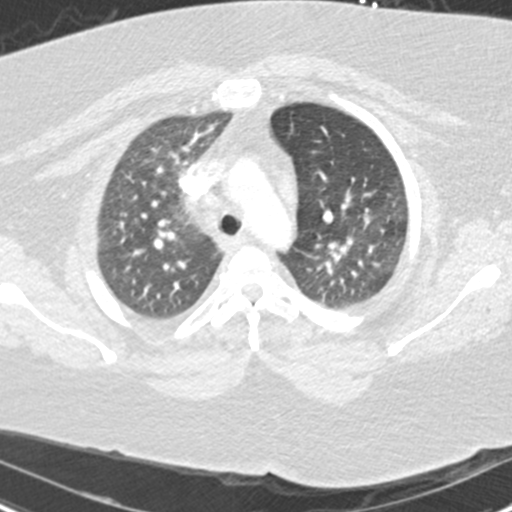
[im 200/250  mediastinal]
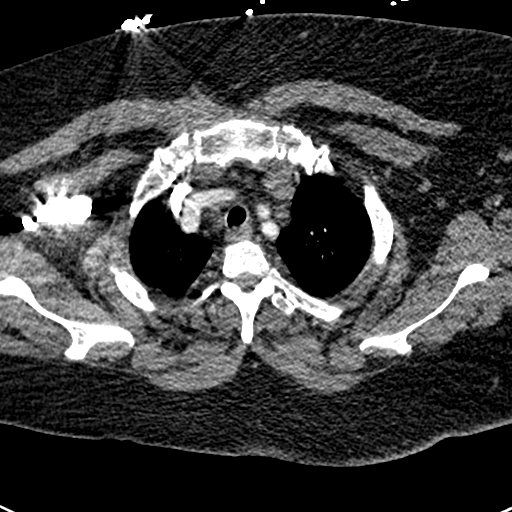
[im 212/250  lung]
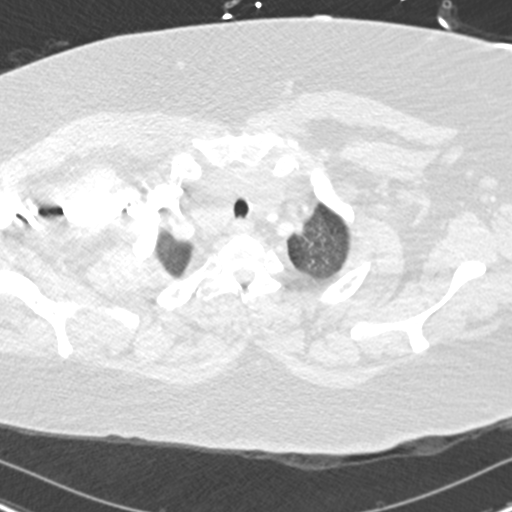
[im 225/250  mediastinal]
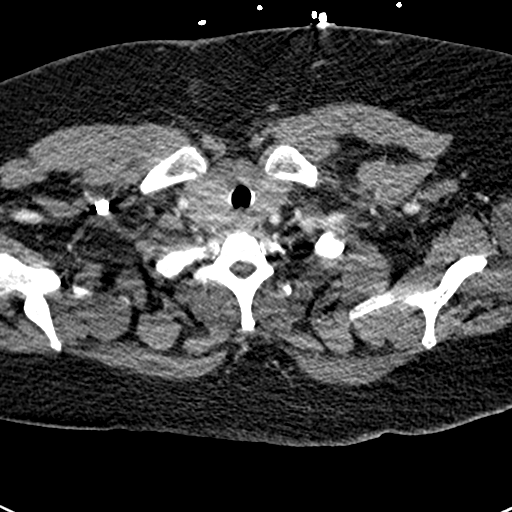
[im 237/250  lung]
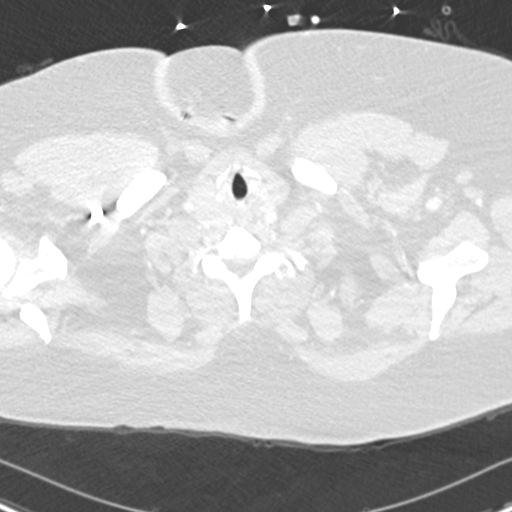

[Series 7: coronal mpr · coronal · 0.48mm/px · 1 of 144 slices shown]
[im 72/144  mediastinal]
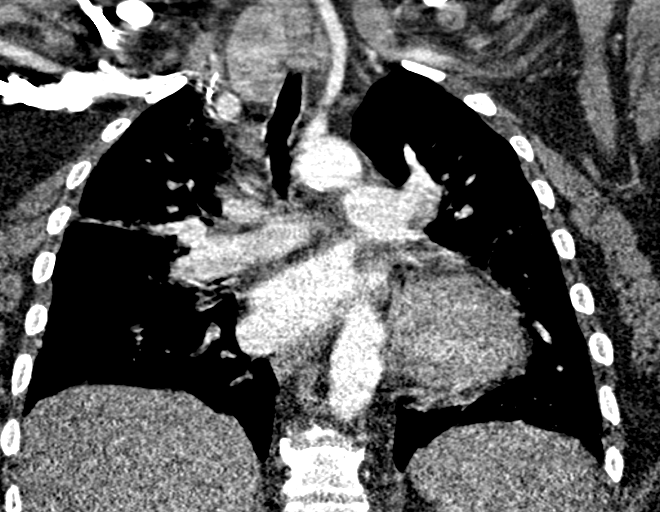

[18 of 36 positions shown; findings below may reference images not displayed]

FINDINGS: Cardiovascular: Evaluation of pulmonary arteries is limited due to
poor contrast bolus and motion artifact. There is moderately good
opacification of the central pulmonary arteries and no large central
filling defects are demonstrated. Diffuse cardiac enlargement. No
pericardial effusions. Normal caliber thoracic aorta. Great vessel
origins are patent.

Mediastinum/Nodes: Esophagus is decompressed. No significant
lymphadenopathy in the chest. Diffuse enlargement of the thyroid
gland likely representing thyroid goiter. Similar appearance to
previous study.

Lungs/Pleura: Evaluation is limited due to motion artifact. There is
patchy mosaic attenuation pattern to the lungs. This can indicate
air trapping or edema or may be due to motion artifact. There is
evidence of atelectasis or edema in the lung bases. Appearances are
similar to previous study. No pleural effusions. No pneumothorax.
Airways are patent.

Upper Abdomen: No acute process demonstrated in the visualized upper
abdomen.

Musculoskeletal: Degenerative changes in the spine. No destructive
bone lesions.

Review of the MIP images confirms the above findings.
IMPRESSION: 1. Evaluation of pulmonary arteries is limited due to motion
artifact and poor contrast bolus but there is no evidence of large
central pulmonary embolus.
2. Cardiac enlargement.
3. Mosaic attenuation pattern to the lungs may be due to motion
artifact, air trapping, or edema. Atelectasis or edema in the lung
bases. Similar to previous study.

## 2020-02-19 DIAGNOSIS — Z79899 Other long term (current) drug therapy: Secondary | ICD-10-CM | POA: Diagnosis not present

## 2020-02-19 DIAGNOSIS — D860 Sarcoidosis of lung: Secondary | ICD-10-CM | POA: Diagnosis not present

## 2020-02-19 DIAGNOSIS — M25562 Pain in left knee: Secondary | ICD-10-CM | POA: Diagnosis not present

## 2020-02-19 DIAGNOSIS — M159 Polyosteoarthritis, unspecified: Secondary | ICD-10-CM | POA: Diagnosis not present

## 2020-02-19 DIAGNOSIS — M064 Inflammatory polyarthropathy: Secondary | ICD-10-CM | POA: Diagnosis not present

## 2020-03-03 ENCOUNTER — Inpatient Hospital Stay: Payer: Medicare Other | Attending: Hematology

## 2020-03-04 NOTE — Telephone Encounter (Signed)
See prior CRM 

## 2020-03-10 DIAGNOSIS — G4733 Obstructive sleep apnea (adult) (pediatric): Secondary | ICD-10-CM | POA: Diagnosis not present

## 2020-03-12 DIAGNOSIS — G4733 Obstructive sleep apnea (adult) (pediatric): Secondary | ICD-10-CM | POA: Diagnosis not present

## 2020-04-10 DIAGNOSIS — G4733 Obstructive sleep apnea (adult) (pediatric): Secondary | ICD-10-CM | POA: Diagnosis not present

## 2020-05-10 DIAGNOSIS — G4733 Obstructive sleep apnea (adult) (pediatric): Secondary | ICD-10-CM | POA: Diagnosis not present

## 2020-05-25 DIAGNOSIS — E611 Iron deficiency: Secondary | ICD-10-CM | POA: Diagnosis not present

## 2020-05-25 DIAGNOSIS — R0602 Shortness of breath: Secondary | ICD-10-CM | POA: Diagnosis not present

## 2020-05-25 DIAGNOSIS — M159 Polyosteoarthritis, unspecified: Secondary | ICD-10-CM | POA: Diagnosis not present

## 2020-05-25 DIAGNOSIS — D860 Sarcoidosis of lung: Secondary | ICD-10-CM | POA: Diagnosis not present

## 2020-05-25 DIAGNOSIS — M064 Inflammatory polyarthropathy: Secondary | ICD-10-CM | POA: Diagnosis not present

## 2020-05-25 DIAGNOSIS — M2550 Pain in unspecified joint: Secondary | ICD-10-CM | POA: Diagnosis not present

## 2020-05-25 DIAGNOSIS — G894 Chronic pain syndrome: Secondary | ICD-10-CM | POA: Diagnosis not present

## 2020-05-29 ENCOUNTER — Other Ambulatory Visit: Payer: Self-pay | Admitting: Adult Health

## 2020-06-03 ENCOUNTER — Inpatient Hospital Stay: Payer: Medicare Other

## 2020-06-10 DIAGNOSIS — G4733 Obstructive sleep apnea (adult) (pediatric): Secondary | ICD-10-CM | POA: Diagnosis not present

## 2020-07-11 DIAGNOSIS — G4733 Obstructive sleep apnea (adult) (pediatric): Secondary | ICD-10-CM | POA: Diagnosis not present

## 2020-07-15 ENCOUNTER — Telehealth: Payer: Self-pay | Admitting: Adult Health

## 2020-07-15 DIAGNOSIS — J441 Chronic obstructive pulmonary disease with (acute) exacerbation: Secondary | ICD-10-CM

## 2020-07-15 DIAGNOSIS — I5033 Acute on chronic diastolic (congestive) heart failure: Secondary | ICD-10-CM

## 2020-07-15 DIAGNOSIS — M1711 Unilateral primary osteoarthritis, right knee: Secondary | ICD-10-CM

## 2020-07-15 DIAGNOSIS — M1712 Unilateral primary osteoarthritis, left knee: Secondary | ICD-10-CM

## 2020-07-15 DIAGNOSIS — J9611 Chronic respiratory failure with hypoxia: Secondary | ICD-10-CM
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
## 2020-07-15 NOTE — Telephone Encounter (Signed)
Order placed

## 2020-07-20 NOTE — Telephone Encounter (Signed)
Patient called needing the order for the wheelchair that she has been requesting since Wednesday.   [facility] is stating that they have not received the order for the wheelchair.   I gave the patient that Order # [id number]  Patient is calling [facility] with this Order #.  Patient would like a call to be updated on the order.  The apartment complex was supposed to be evacuated to be fumigated and because she was not able to get out of the apartment the landlord had to reschedule.  She is also needing an order for a portable oxygen tank to take with her to the hotel.  Please advise

## 2020-07-20 NOTE — Telephone Encounter (Signed)
Pt is calling in stating that she can not wait until 07/21/2020 for the order to be sent to [facility] and she stated that the company stated to her that their fax machine is done and their is no other way that the order can be sent over to the company.  Pt is not happy that she has to wait another day for this to be resolved.

## 2020-07-20 NOTE — Telephone Encounter (Signed)
Patient called after hours line on 07/18/2020. Patient reports she was supposed to have an order placed for a wheelchair, because she has to move out of her apartment. Patient states the [facility]) company has not received the order. Patient states she spoke with the office several times last week regarding the order.

## 2020-07-21 ENCOUNTER — Encounter: Payer: Self-pay | Admitting: *Deleted

## 2020-07-21 NOTE — Telephone Encounter (Signed)
[facility] for order for wheelchair.   Portable oxygen needs to come from pulmonary

## 2020-07-21 NOTE — Telephone Encounter (Signed)
Dusti from [facility] said they will have to deny the wheelchair because of the pt's weight. It will need to be sent to a specialty wheelchair company.

## 2020-07-21 NOTE — Telephone Encounter (Signed)
Spoke with patient and explained that we are waiting on a call from [facility] and will try [employer] also.

## 2020-07-21 NOTE — Telephone Encounter (Signed)
Ive called [employer] to see if they have a wheelchair.  They will call back.

## 2020-07-23 NOTE — Telephone Encounter (Signed)
Order faxed and confirmed to [employer].

## 2020-07-24 ENCOUNTER — Telehealth: Payer: Self-pay | Admitting: Adult Health

## 2020-07-24 NOTE — Telephone Encounter (Signed)
Pt contacted [employer]--they said they didn't receive a fax from you.  Patient would like a call back.

## 2020-07-24 NOTE — Telephone Encounter (Signed)
[name] has re faxed the form

## 2020-07-24 NOTE — Telephone Encounter (Signed)
Pt is calling to see what is the status of her wheelchair. She said she cannot get another week from the landlord and needs her wheelchair

## 2020-07-24 NOTE — Telephone Encounter (Signed)
Patient is aware that a fax has been sent to [employer] and waiting for a response.

## 2020-07-29 ENCOUNTER — Encounter: Payer: Self-pay | Admitting: Adult Health

## 2020-07-29 ENCOUNTER — Other Ambulatory Visit: Payer: Self-pay

## 2020-07-29 ENCOUNTER — Telehealth (INDEPENDENT_AMBULATORY_CARE_PROVIDER_SITE_OTHER): Payer: Medicare Other | Admitting: Adult Health

## 2020-07-29 DIAGNOSIS — Z7689 Persons encountering health services in other specified circumstances: Secondary | ICD-10-CM | POA: Diagnosis not present

## 2020-07-29 NOTE — Progress Notes (Signed)
Virtual Visit via Video Note  I connected with [name] on 07/29/20 at 10:00 AM EDT by a video enabled telemedicine application and verified that I am speaking with the correct person using two identifiers.  Location patient: home Location provider:work or home office Persons participating in the virtual visit: patient, provider  I discussed the limitations of evaluation and management by telemedicine and the availability of in person appointments. The patient expressed understanding and agreed to proceed.   HPI:  54 year old female who is being evaluated for mobility assistance for power wheelchair.  She has a history of diastolic heart failure, super morbid obesity, COPD with chronic respiratory failure on 2 L of continuous oxygen via nasal cannula, sleep apnea, and possible sarcoidosis.  She is currently prescribed methotrexate by rheumatology as well as torsemide 40 mg daily by cardiology and uses an albuterol inhaler as needed.  Due to extensive chronic health issues including morbid obesity she finds it difficult to ambulate even short distances around the house.  She finds it difficult to perform ADLs and does not leave the house often due to shortness of breath and difficulty ambulating due to upper and lower extremity weakness.   She does ambulate she feels unsteady due to her weight/ lower extremity weakness,  shortness of breath, and having to wear oxygen tubing.   She is interested in having a power wheelchair to assist her in her home in order to perform ADLs such as cooking, bathing, using the toilet, dressing, as well as getting out of the house more often so that she can enjoy certain aspects of her life.  Wt Readings from Last 3 Encounters:  01/24/20 (!) 508 lb (230.4 kg)  12/05/19 (!) 507 lb 3.2 oz (230.1 kg)  11/06/19 (!) 498 lb (225.9 kg)     ROS: See pertinent positives and negatives per HPI.  Past Medical History:  Diagnosis Date  . Anemia   .  Arthritis    hands, shoulders, no meds  . CHF (congestive heart failure) (HCC)    EF60-65%  . Chronic hyperventilation syndrome    w/ obesity tx with albuterol inhaler and oxygen 2L  . COPD (chronic obstructive pulmonary disease) ([place])    uses oxygen 2 L  . Gallstones   . GERD (gastroesophageal reflux disease)    diet controlled - no meds  . H/O hiatal hernia   . Hypertension   . Hypothyroidism   . Kidney stones   . Morbid obesity ([place])   . Pneumonia    hoispitalized in 08/2011  . Sarcoidosis   . Seasonal allergies   . Sleep apnea    uses CPAP machine     Past Surgical History:  Procedure Laterality Date  . [place]   x 1  . CHOLECYSTECTOMY  2000  . DILATION AND CURETTAGE OF UTERUS N/A 08/09/2016   Procedure: DILATATION AND CURETTAGE;  Surgeon: Everitt Amber, MD;  Location: [facility];  Service: Gynecology;  Laterality: N/A;  . HYSTEROSCOPY WITH D & C  12/27/2011   Procedure: DILATATION AND CURETTAGE /HYSTEROSCOPY;  Surgeon: Maeola Sarah. Landry Mellow, MD;  Location: [facility];  Service: Gynecology;;  . I and D of abcess  05/2011  . INTRAUTERINE DEVICE (IUD) INSERTION N/A 08/09/2016   Procedure: INTRAUTERINE DEVICE (IUD) INSERTION;  Surgeon: Everitt Amber, MD;  Location: [facility];  Service: Gynecology;  Laterality: N/A;  . LUNG BIOPSY    . uterine abletion      Family History  Problem Relation Age of  Onset  . Diabetes Father   . Cancer Father 51       colon cancer   . Diabetes Brother   . Deep vein thrombosis Mother   . Aneurysm Sister        d/o brain aneurysm       Current Outpatient Medications:  .  albuterol (PROAIR HFA) 108 (90 Base) MCG/ACT inhaler, Inhale 1-2 puffs into the lungs every 6 (six) hours as needed for wheezing or shortness of breath., Disp: , Rfl:  .  atorvastatin (LIPITOR) 40 MG tablet, TAKE 1 TABLET (40 MG TOTAL) BY MOUTH DAILY AT 6 PM., Disp: 90 tablet, Rfl: 3 .  B-D TB SYRINGE 1CC/27GX1/2" 27G X 1/2" 1 ML MISC, USE ONCE A WEEK TO INJECT METHOTREXATE,  Disp: , Rfl:  .  calcium citrate-vitamin D (CITRACAL+D) 315-200 MG-UNIT tablet, Take 1 tablet by mouth daily., Disp: , Rfl:  .  cetirizine (ZYRTEC) 10 MG tablet, Take 10 mg by mouth at bedtime as needed for allergies., Disp: , Rfl:  .  famotidine (PEPCID) 40 MG tablet, Take 1 tablet (40 mg total) by mouth 2 (two) times daily., Disp: 180 tablet, Rfl: 3 .  folic acid (FOLVITE) 1 MG tablet, Take 1 mg by mouth daily., Disp: , Rfl: 3 .  HYDROcodone-acetaminophen (NORCO) 10-325 MG tablet, Take 1 tablet by mouth every 6 (six) hours as needed (pain)., Disp: 30 tablet, Rfl: 0 .  losartan (COZAAR) 100 MG tablet, TAKE 1 TABLET (100 MG TOTAL) DAILY BY MOUTH., Disp: 90 tablet, Rfl: 3 .  methotrexate 50 MG/2ML injection, Inject 20 mg into the vein once a week. Dose is 0.8 mL (20mg ). Take on Fridays, Disp: , Rfl:  .  metoprolol succinate (TOPROL-XL) 25 MG 24 hr tablet, TAKE 1 TABLET BY MOUTH EVERY DAY, Disp: 90 tablet, Rfl: 3 .  NON FORMULARY, 2 liter of oxygen, Disp: , Rfl:  .  potassium chloride SA (KLOR-CON) 20 MEQ tablet, Take 20 mEq by mouth daily., Disp: , Rfl:  .  predniSONE (DELTASONE) 10 MG tablet, Take 1 tablet (10 mg total) by mouth daily., Disp: 10 tablet, Rfl: 0 .  torsemide (DEMADEX) 20 MG tablet, Take 2 tablets (40 mg total) by mouth daily. Take additional 20 mg for weight gain of 3 lbs in 1 day or 5 lbs in 2 days or swelling or shortness of breath., Disp: 90 tablet, Rfl: 0 .  triamcinolone acetonide (KENALOG) 40 MG/ML injection, Inject 40 mg into the muscle every 3 (three) months. For knee pain, Disp: , Rfl:  .  triamcinolone ointment (KENALOG) 0.1 %, Apply 1 application topically 2 (two) times daily. For Sarcoidosis, Disp: , Rfl:  .  vitamin B-12 1000 MCG tablet, Take 1 tablet (1,000 mcg total) by mouth daily., Disp: 30 tablet, Rfl: 0  EXAM:  VITALS per patient if applicable:  GENERAL: alert, oriented, appears well and in no acute distress. Obese  HEENT: atraumatic, conjunttiva clear, no  obvious abnormalities on inspection of external nose and ears  NECK: normal movements of the head and neck  LUNGS: on inspection no signs of respiratory distress, breathing rate appears normal, no obvious gross SOB, gasping or wheezing. Wearing nasal canula.   CV: no obvious cyanosis  MS: Unable to assess upper and lower extremity weakness due to being a telehealth visit.  In the past, she has had significant lower extremity and upper extremity weakness.  Has a slow steady gait when she is in the office  PSYCH/NEURO: pleasant and cooperative, no  obvious depression or anxiety, speech and thought processing grossly intact  ASSESSMENT AND PLAN:  Discussed the following assessment and plan:  1. Encounter for power mobility device assessment - DME Wheelchair electric Do think this patient would be an excellent candidate for a power wheelchair.  By having a power wheelchair she would be able to perform her ADLs and will have some less symptomatic shortness of breath throughout the day.  This would also allow her to go to her medical appointments as needed.  Having a power wheelchair would also provide a sense of wellbeing if she would be able to enjoy being outside and not stuck in the house all day.  I do feel as though this patient has the mental capacity to operate a power wheelchair safely and she is willing to use it in her home.  Due to chronic shortness of breath with ambulation even short distances as well as morbid obesity I do not think a cane or walker would be an ideal device, although this would give the patient some extra stability, it would not do anything to help correct shortness of breath with exertion that is caused by her CHF, COPD, and possible sarcoidosis.  I do not feel as though the scooter would be beneficial or ideal for this patient.  I believe due to her morbid obesity and chronic shortness of breath it would be difficult for her to transfer on and off the scooter and it  would be difficult using a scooter due to lack of postural instability and would actually make her shortness of breath worse while exerting herself to propel the scooter  I do not feel as though a manual wheelchair would be sufficient to help her perform her ADLs as I believe that she would have a difficult time propelling the wheelchair due to upper extremity weakness.    I discussed the assessment and treatment plan with the patient. The patient was provided an opportunity to ask questions and all were answered. The patient agreed with the plan and demonstrated an understanding of the instructions.   The patient was advised to call back or seek an in-person evaluation if the symptoms worsen or if the condition fails to improve as anticipated.   [name], NP

## 2020-08-10 ENCOUNTER — Other Ambulatory Visit: Payer: Self-pay

## 2020-08-10 ENCOUNTER — Inpatient Hospital Stay (HOSPITAL_COMMUNITY)
Admission: EM | Admit: 2020-08-10 | Discharge: 2020-08-16 | DRG: 291 | Disposition: A | Discharge status: Home Health | Payer: Medicare Other | Attending: Internal Medicine | Admitting: Internal Medicine

## 2020-08-10 ENCOUNTER — Encounter (HOSPITAL_COMMUNITY): Payer: Self-pay

## 2020-08-10 ENCOUNTER — Emergency Department (HOSPITAL_COMMUNITY): Payer: Medicare Other

## 2020-08-10 DIAGNOSIS — Z9049 Acquired absence of other specified parts of digestive tract: Secondary | ICD-10-CM

## 2020-08-10 DIAGNOSIS — R079 Chest pain, unspecified: Secondary | ICD-10-CM | POA: Diagnosis not present

## 2020-08-10 DIAGNOSIS — D869 Sarcoidosis, unspecified: Secondary | ICD-10-CM | POA: Diagnosis present

## 2020-08-10 DIAGNOSIS — I2721 Secondary pulmonary arterial hypertension: Secondary | ICD-10-CM | POA: Diagnosis not present

## 2020-08-10 DIAGNOSIS — E039 Hypothyroidism, unspecified: Secondary | ICD-10-CM | POA: Diagnosis not present

## 2020-08-10 DIAGNOSIS — J9611 Chronic respiratory failure with hypoxia: Secondary | ICD-10-CM | POA: Diagnosis not present

## 2020-08-10 DIAGNOSIS — Z23 Encounter for immunization: Secondary | ICD-10-CM | POA: Diagnosis not present

## 2020-08-10 DIAGNOSIS — Z20822 Contact with and (suspected) exposure to covid-19: Secondary | ICD-10-CM | POA: Diagnosis present

## 2020-08-10 DIAGNOSIS — R0602 Shortness of breath: Secondary | ICD-10-CM

## 2020-08-10 DIAGNOSIS — I5082 Biventricular heart failure: Secondary | ICD-10-CM | POA: Diagnosis not present

## 2020-08-10 DIAGNOSIS — D500 Iron deficiency anemia secondary to blood loss (chronic): Secondary | ICD-10-CM | POA: Diagnosis not present

## 2020-08-10 DIAGNOSIS — I110 Hypertensive heart disease with heart failure: Secondary | ICD-10-CM | POA: Diagnosis not present

## 2020-08-10 DIAGNOSIS — J9811 Atelectasis: Secondary | ICD-10-CM | POA: Diagnosis not present

## 2020-08-10 DIAGNOSIS — I10 Essential (primary) hypertension: Secondary | ICD-10-CM | POA: Diagnosis present

## 2020-08-10 DIAGNOSIS — Z7952 Long term (current) use of systemic steroids: Secondary | ICD-10-CM

## 2020-08-10 DIAGNOSIS — K219 Gastro-esophageal reflux disease without esophagitis: Secondary | ICD-10-CM | POA: Diagnosis not present

## 2020-08-10 DIAGNOSIS — R0902 Hypoxemia: Secondary | ICD-10-CM | POA: Diagnosis not present

## 2020-08-10 DIAGNOSIS — I5031 Acute diastolic (congestive) heart failure: Secondary | ICD-10-CM | POA: Diagnosis not present

## 2020-08-10 DIAGNOSIS — D649 Anemia, unspecified: Secondary | ICD-10-CM | POA: Diagnosis not present

## 2020-08-10 DIAGNOSIS — I509 Heart failure, unspecified: Secondary | ICD-10-CM | POA: Diagnosis not present

## 2020-08-10 DIAGNOSIS — J449 Chronic obstructive pulmonary disease, unspecified: Secondary | ICD-10-CM | POA: Diagnosis present

## 2020-08-10 DIAGNOSIS — K449 Diaphragmatic hernia without obstruction or gangrene: Secondary | ICD-10-CM | POA: Diagnosis not present

## 2020-08-10 DIAGNOSIS — D860 Sarcoidosis of lung: Secondary | ICD-10-CM | POA: Diagnosis present

## 2020-08-10 DIAGNOSIS — Z91048 Other nonmedicinal substance allergy status: Secondary | ICD-10-CM | POA: Diagnosis not present

## 2020-08-10 DIAGNOSIS — I517 Cardiomegaly: Secondary | ICD-10-CM | POA: Diagnosis not present

## 2020-08-10 DIAGNOSIS — R0789 Other chest pain: Secondary | ICD-10-CM | POA: Diagnosis not present

## 2020-08-10 DIAGNOSIS — I5033 Acute on chronic diastolic (congestive) heart failure: Secondary | ICD-10-CM | POA: Diagnosis present

## 2020-08-10 DIAGNOSIS — J302 Other seasonal allergic rhinitis: Secondary | ICD-10-CM | POA: Diagnosis present

## 2020-08-10 DIAGNOSIS — E662 Morbid (severe) obesity with alveolar hypoventilation: Secondary | ICD-10-CM | POA: Diagnosis present

## 2020-08-10 DIAGNOSIS — Z975 Presence of (intrauterine) contraceptive device: Secondary | ICD-10-CM | POA: Diagnosis not present

## 2020-08-10 DIAGNOSIS — Z9989 Dependence on other enabling machines and devices: Secondary | ICD-10-CM | POA: Diagnosis not present

## 2020-08-10 DIAGNOSIS — Z79899 Other long term (current) drug therapy: Secondary | ICD-10-CM

## 2020-08-10 DIAGNOSIS — R609 Edema, unspecified: Secondary | ICD-10-CM | POA: Diagnosis not present

## 2020-08-10 DIAGNOSIS — G4733 Obstructive sleep apnea (adult) (pediatric): Secondary | ICD-10-CM | POA: Diagnosis present

## 2020-08-10 DIAGNOSIS — I272 Pulmonary hypertension, unspecified: Secondary | ICD-10-CM | POA: Diagnosis not present

## 2020-08-10 DIAGNOSIS — Z6841 Body Mass Index (BMI) 40.0 and over, adult: Secondary | ICD-10-CM

## 2020-08-10 DIAGNOSIS — I872 Venous insufficiency (chronic) (peripheral): Secondary | ICD-10-CM | POA: Diagnosis present

## 2020-08-10 DIAGNOSIS — Z9981 Dependence on supplemental oxygen: Secondary | ICD-10-CM

## 2020-08-10 LAB — CBC WITH DIFFERENTIAL/PLATELET
Abs Immature Granulocytes: 0.04 10*3/uL (ref 0.00–0.07)
Basophils Absolute: 0 10*3/uL (ref 0.0–0.1)
Basophils Relative: 0 %
Eosinophils Absolute: 0.2 10*3/uL (ref 0.0–0.5)
Eosinophils Relative: 2 %
HCT: 34.3 % — ABNORMAL LOW (ref 36.0–46.0)
Hemoglobin: 9.2 g/dL — ABNORMAL LOW (ref 12.0–15.0)
Immature Granulocytes: 0 %
Lymphocytes Relative: 12 %
Lymphs Abs: 1.1 10*3/uL (ref 0.7–4.0)
MCH: 19.7 pg — ABNORMAL LOW (ref 26.0–34.0)
MCHC: 26.8 g/dL — ABNORMAL LOW (ref 30.0–36.0)
MCV: 73.6 fL — ABNORMAL LOW (ref 80.0–100.0)
Monocytes Absolute: 0.9 10*3/uL (ref 0.1–1.0)
Monocytes Relative: 10 %
Neutro Abs: 6.8 10*3/uL (ref 1.7–7.7)
Neutrophils Relative %: 76 %
Platelets: 191 10*3/uL (ref 150–400)
RBC: 4.66 MIL/uL (ref 3.87–5.11)
RDW: 24.7 % — ABNORMAL HIGH (ref 11.5–15.5)
WBC: 9 10*3/uL (ref 4.0–10.5)
nRBC: 0 % (ref 0.0–0.2)

## 2020-08-10 LAB — COMPREHENSIVE METABOLIC PANEL
ALT: 14 U/L (ref 0–44)
AST: 15 U/L (ref 15–41)
Albumin: 3.6 g/dL (ref 3.5–5.0)
Alkaline Phosphatase: 42 U/L (ref 38–126)
Anion gap: 7 (ref 5–15)
BUN: 16 mg/dL (ref 6–20)
CO2: 34 mmol/L — ABNORMAL HIGH (ref 22–32)
Calcium: 8.8 mg/dL — ABNORMAL LOW (ref 8.9–10.3)
Chloride: 102 mmol/L (ref 98–111)
Creatinine, Ser: 0.9 mg/dL (ref 0.44–1.00)
GFR, Estimated: >60 mL/min (ref >60)
Glucose, Bld: 98 mg/dL (ref 70–99)
Potassium: 4 mmol/L (ref 3.5–5.1)
Sodium: 143 mmol/L (ref 135–145)
Total Bilirubin: 0.6 mg/dL (ref 0.3–1.2)
Total Protein: 7.2 g/dL (ref 6.5–8.1)

## 2020-08-10 LAB — RESPIRATORY PANEL BY RT PCR (FLU A&B, COVID)
Influenza A by PCR: NEGATIVE
Influenza B by PCR: NEGATIVE
SARS Coronavirus 2 by RT PCR: NEGATIVE

## 2020-08-10 LAB — TROPONIN I (HIGH SENSITIVITY): Troponin I (High Sensitivity): 6 ng/L (ref <18)

## 2020-08-10 LAB — POC OCCULT BLOOD, ED: Fecal Occult Bld: NEGATIVE

## 2020-08-10 LAB — BRAIN NATRIURETIC PEPTIDE: B Natriuretic Peptide: 34.7 pg/mL (ref 0.0–100.0)

## 2020-08-10 NOTE — ED Triage Notes (Signed)
Patient BIB [facility] with c/o increased shortness of breath for the last 2 weeks but the worst is the past 4 days. Patient did not take her Lasix today. Patient noticed fluid retention in her legs, feet, ankles and hands. Rales and crackles in lower lobes bilaterally. Patient says she is always on 2L [place]. Patient says weight has increased 15 pounds in the last 2 weeks.  EMS vitals BP 188/87 94% 2L 97 heart rate 22F temp

## 2020-08-10 NOTE — ED Provider Notes (Signed)
[facility] DEPT Provider Note   CSN: [id number] Arrival date & time: 08/10/20  9242     History Chief Complaint  Patient presents with  . Shortness of Breath    [name] is a 54 y.o. female.  HPI Patient presents been worsening over the last 2 weeks but worse over the last few days.  States she is up around 14 pounds.  States it feels like her CHF states she is on torsemide.  States Dr. [name] has her increase the medicine when she feels as if her weight is going up.  States she is increased it but she is if anything been urinating less.  Occasional cough.  Is on chronic oxygen at home.    Past Medical History:  Diagnosis Date  . Anemia   . Arthritis    hands, shoulders, no meds  . CHF (congestive heart failure) (HCC)    EF60-65%  . Chronic hyperventilation syndrome    w/ obesity tx with albuterol inhaler and oxygen 2L  . COPD (chronic obstructive pulmonary disease) ([place])    uses oxygen 2 L  . Gallstones   . GERD (gastroesophageal reflux disease)    diet controlled - no meds  . H/O hiatal hernia   . Hypertension   . Hypothyroidism   . Kidney stones   . Morbid obesity ([place])   . Pneumonia    hoispitalized in 08/2011  . Sarcoidosis   . Seasonal allergies   . Sleep apnea    uses CPAP machine     Patient Active Problem List   Diagnosis Date Noted  . Diastolic CHF ([place]) 68/34/1962  . Acute on chronic diastolic (congestive) heart failure ([place]) 05/08/2019  . CHF exacerbation ([place]) 04/17/2018  . Unilateral primary osteoarthritis, left knee 02/20/2017  . Unilateral primary osteoarthritis, right knee 02/20/2017  . COPD exacerbation ([place]) 02/12/2017  . GERD (gastroesophageal reflux disease) 02/12/2017  . Abnormal uterine bleeding 08/09/2016  . Microcytic anemia 02/27/2016  . Hyperlipidemia 02/24/2016  . Anxiety 04/14/2015  . SOB (shortness of breath)   . Hypoxemia   . Symptomatic anemia 01/15/2015  . Hypothyroidism  01/15/2015  . Morbid obesity ([place]) 01/15/2015  . Fibroids 12/09/2013  . Hyperglycemia 09/16/2013  . Hematuria 05/23/2013  . Nephrolithiasis 05/23/2013  . Sarcoidosis 09/09/2012  . Iron deficiency anemia due to chronic blood loss 09/09/2012  . Hypertension 02/25/2011  . Obesity hypoventilation syndrome ([place]) 02/14/2011  . OSA (obstructive sleep apnea) 02/10/2011    Past Surgical History:  Procedure Laterality Date  . [place]   x 1  . CHOLECYSTECTOMY  2000  . DILATION AND CURETTAGE OF UTERUS N/A 08/09/2016   Procedure: DILATATION AND CURETTAGE;  Surgeon: Everitt Amber, MD;  [facility]: [facility];  Service: Gynecology;  Laterality: N/A;  . HYSTEROSCOPY WITH D & C  12/27/2011   Procedure: DILATATION AND CURETTAGE /HYSTEROSCOPY;  Surgeon: Maeola Sarah. Landry Mellow, MD;  Location: [facility];  Service: Gynecology;;  . I and D of abcess  05/2011  . INTRAUTERINE DEVICE (IUD) INSERTION N/A 08/09/2016   Procedure: INTRAUTERINE DEVICE (IUD) INSERTION;  Surgeon: Everitt Amber, MD;  Location: [facility];  Service: Gynecology;  Laterality: N/A;  . LUNG BIOPSY    . uterine abletion       OB History   No obstetric history on file.     Family History  Problem Relation Age of Onset  . Diabetes Father   . Cancer Father 60       colon  cancer   . Diabetes Brother   . Deep vein thrombosis Mother   . Aneurysm Sister        d/o brain aneurysm    Social History   Tobacco Use  . Smoking status: Never Smoker  . Smokeless tobacco: Never Used  Vaping Use  . Vaping Use: Never used  Substance Use Topics  . Alcohol use: Yes    Comment: occasionally  . Drug use: No    Home Medications Prior to Admission medications   Medication Sig Start Date End Date Taking? Authorizing Provider  albuterol (PROAIR HFA) 108 (90 Base) MCG/ACT inhaler Inhale 1-2 puffs into the lungs every 6 (six) hours as needed for wheezing or shortness of breath. 10/29/18   Hongalgi, Lenis Dickinson, MD  atorvastatin (LIPITOR) 40 MG tablet TAKE 1  TABLET (40 MG TOTAL) BY MOUTH DAILY AT 6 PM. 01/30/20   Nafziger, Tommi Rumps, NP  B-D TB SYRINGE 1CC/27GX1/2" 27G X 1/2" 1 ML MISC USE ONCE A WEEK TO INJECT METHOTREXATE 11/05/19   [provider]  calcium citrate-vitamin D (CITRACAL+D) 315-200 MG-UNIT tablet Take 1 tablet by mouth daily.    [provider]  cetirizine (ZYRTEC) 10 MG tablet Take 10 mg by mouth at bedtime as needed for allergies.    [provider]  famotidine (PEPCID) 40 MG tablet Take 1 tablet (40 mg total) by mouth 2 (two) times daily. 11/13/18 01/24/20  Nafziger, Tommi Rumps, NP  folic acid (FOLVITE) 1 MG tablet Take 1 mg by mouth daily. 01/11/16   [provider]  HYDROcodone-acetaminophen (NORCO) 10-325 MG tablet Take 1 tablet by mouth every 6 (six) hours as needed (pain). 11/21/16   Pete Pelt, PA-C  losartan (COZAAR) 100 MG tablet TAKE 1 TABLET (100 MG TOTAL) DAILY BY MOUTH. 05/29/20   Nafziger, Tommi Rumps, NP  methotrexate 50 MG/2ML injection Inject 20 mg into the vein once a week. Dose is 0.8 mL (20mg ). Take on Fridays    [provider]  metoprolol succinate (TOPROL-XL) 25 MG 24 hr tablet TAKE 1 TABLET BY MOUTH EVERY DAY 05/29/20   Nafziger, Tommi Rumps, NP  NON FORMULARY 2 liter of oxygen    [provider]  potassium chloride SA (KLOR-CON) 20 MEQ tablet Take 20 mEq by mouth daily.    [provider]  predniSONE (DELTASONE) 10 MG tablet Take 1 tablet (10 mg total) by mouth daily. 01/17/18   Florencia Reasons, MD  torsemide (DEMADEX) 20 MG tablet Take 2 tablets (40 mg total) by mouth daily. Take additional 20 mg for weight gain of 3 lbs in 1 day or 5 lbs in 2 days or swelling or shortness of breath. 08/22/19   Lavina Hamman, MD  triamcinolone acetonide (KENALOG) 40 MG/ML injection Inject 40 mg into the muscle every 3 (three) months. For knee pain    [provider]  triamcinolone ointment (KENALOG) 0.1 % Apply 1 application topically 2 (two) times daily. For Sarcoidosis    [provider]  vitamin B-12 1000 MCG tablet Take 1 tablet (1,000 mcg total) by mouth daily. 06/19/16   Lavina Hamman, MD    Allergies    Tape  Review of Systems   Review of Systems  Constitutional: Positive for fatigue. Negative for appetite change.  Respiratory: Positive for cough and shortness of breath.   Cardiovascular: Positive for leg swelling.  Gastrointestinal: Negative for abdominal pain.  Genitourinary: Negative for flank pain.  Musculoskeletal: Negative for back pain.  Skin: Negative for wound.  Neurological: Negative for weakness.  Psychiatric/Behavioral: Negative for confusion.    Physical Exam Updated Vital Signs BP 133/65   Pulse 76   Temp 98 F (36.7 C) (Oral)   Resp (!) 23   Ht 5\' 6"  (1.676 m)   Wt (!) 229.1 kg   LMP 07/27/2020   SpO2 99%   BMI 81.51 kg/m   Physical Exam Constitutional:      Appearance: She is obese.  HENT:     Head: Normocephalic.  Cardiovascular:     Rate and Rhythm: Regular rhythm.  Pulmonary:     Comments: appearsSomewhat difficult exam due to body habitus, but appears to have rales at the bases. Chest:     Chest wall: No tenderness.  Abdominal:     Tenderness: There is no abdominal tenderness.  Musculoskeletal:     Right lower leg: Edema present.     Left lower leg: Edema present.     Comments: Pitting edema bilateral lower extremities.  Skin:    Capillary Refill: Capillary refill takes less than 2 seconds.  Neurological:     Mental Status: She is oriented to person, place, and time.     ED Results / Procedures / Treatments   Labs (all labs ordered are listed, but only abnormal results are displayed) Labs Reviewed  COMPREHENSIVE METABOLIC PANEL - Abnormal; Notable for the following components:      Result Value   CO2 34 (*)    Calcium 8.8 (*)    All other components within normal limits  CBC WITH DIFFERENTIAL/PLATELET - Abnormal; Notable for the following components:   Hemoglobin 9.2 (*)    HCT 34.3 (*)     MCV 73.6 (*)    MCH 19.7 (*)    MCHC 26.8 (*)    RDW 24.7 (*)    All other components within normal limits  RESPIRATORY PANEL BY RT PCR (FLU A&B, COVID)  BRAIN NATRIURETIC PEPTIDE  POC OCCULT BLOOD, ED  TROPONIN I (HIGH SENSITIVITY)  TROPONIN I (HIGH SENSITIVITY)    EKG EKG Interpretation  Date/Time:  Monday August 10 2020 18:10:54 EDT Ventricular Rate:  83 PR Interval:    QRS Duration: 92 QT Interval:  377 QTC Calculation: 443 R Axis:   65 Text Interpretation: Sinus rhythm Confirmed by Davonna Belling [phone]) on 08/10/2020 8:51:57 PM   Radiology DG Chest 2 View  Result Date: 08/10/2020 CLINICAL DATA:  54 year old female with increasing shortness of breath for 2 weeks. Fluid retention in the extremities. EXAM: CHEST - 2 VIEW COMPARISON:  Chest CTA 08/16/2019 and earlier. FINDINGS: AP semi upright and lateral views. Stable cardiomegaly and mediastinal contours. Visualized tracheal air column is within normal limits. No pneumothorax or layering pleural effusion. Pulmonary interstitial opacity/indistinctness of pulmonary vasculature appears unchanged from last year. There is trace pleural fluid in the right minor fissure. No consolidation. No acute osseous abnormality identified. Paucity bowel gas in the upper abdomen. IMPRESSION: Cardiomegaly with indistinct pulmonary vasculature and trace fluid in the right minor fissure. Suspect acute interstitial edema. Electronically Signed   By: Genevie Ann M.D.   On: 08/10/2020 19:01    Procedures Procedures (including critical care time)  Medications Ordered in ED Medications - No data to display  ED Course  I have reviewed the triage vital signs and the nursing notes.  Pertinent labs & imaging results that were available during my care of the patient were reviewed by me and considered in my medical decision making (see chart for details).  MDM Rules/Calculators/A&P                         Patient presents worsening shortness of  breath.  Has put on around 14 pounds over the last 2 weeks.  Chest today showed likely interstitial edema.  However BNP is reassuring.  Does have an anemia.  Lower than baseline with hemoglobin in the nines.  Guaiac negative but has had transfusions in the past.  She is microcytic.  Sees hematology for it.  With the anemia and the shortness of breath I feels the patient would benefit from admission to the hospital.  Will discuss with hospitalist. Final Clinical Impression(s) / ED Diagnoses Final diagnoses:  Acute on chronic congestive heart failure, unspecified heart failure type ([place])  Anemia, unspecified type    Rx / DC Orders ED Discharge Orders    None       Davonna Belling, MD 08/10/20 2254

## 2020-08-10 NOTE — H&P (Signed)
History and Physical   [name] BMW:[id number] DOB: [birth year] DOA: 08/10/2020  Referring MD/NP/PA: Dr. Alvino Chapel  PCP: [name], NP   Outpatient Specialists: Dr. Burr Medico, oncology and Dr. [name], cardiology  Patient coming from: Home  Chief Complaint: Shortness of breath and increased weight  HPI: [name] is a 54 y.o. female with medical history significant of diastolic CHF, iron deficiency anemia, COPD, morbidly obese, GERD, sarcoidosis, obstructive sleep apnea, hypothyroidism and hypertension who presented to the ER with worsening shortness of breath and increased weight in the last month.  She has gained about 15 pounds.  Patient normally is on 2 L of oxygen at home and uses CPAP at night.  She was seen in the ER with slight increase in oxygen requirement.  She has history of COPD but no wheezing.  She is currently requiring 3 L of oxygen.  She is also notably having significant edema.  Patient has been taking oral Bumex at home but has not been having significant response.  She is being admitted for IV diuretics and reevaluation.  She had her last echocardiogram a year ago where the EF was 55 to 60%.  She denied any chest pain.  Denied any recent cardiac work-up.  She has sarcoidosis and is on chronic prednisone..  ED Course: Temperature 98 blood pressure 170/75 pulse 111 respirate of 38 oxygen sats 93% on 3 L.  White count is 9.0 hemoglobin 9.1 platelets 191.  Sodium 143 potassium 4.0 chloride 102 CO2 34 BUN 16 creatinine 0.90 calcium 8.8.  Respiratory panel is negative.  Fecal occult blood testing negative x2.  PT/INR 14 and 1.06.  Chest x-ray showed cardiomegaly with pulmonary vascular and trace fluid in the right effusion acute interstitial edema.  Patient being admitted for evaluation and treatment  Review of Systems: As per HPI otherwise 10 point review of systems negative.    Past Medical History:  Diagnosis Date  . Anemia   . Arthritis    hands,  shoulders, no meds  . CHF (congestive heart failure) (HCC)    EF60-65%  . Chronic hyperventilation syndrome    w/ obesity tx with albuterol inhaler and oxygen 2L  . COPD (chronic obstructive pulmonary disease) ([place])    uses oxygen 2 L  . Gallstones   . GERD (gastroesophageal reflux disease)    diet controlled - no meds  . H/O hiatal hernia   . Hypertension   . Hypothyroidism   . Kidney stones   . Morbid obesity ([place])   . Pneumonia    hoispitalized in 08/2011  . Sarcoidosis   . Seasonal allergies   . Sleep apnea    uses CPAP machine     Past Surgical History:  Procedure Laterality Date  . [place]   x 1  . CHOLECYSTECTOMY  2000  . DILATION AND CURETTAGE OF UTERUS N/A 08/09/2016   Procedure: DILATATION AND CURETTAGE;  Surgeon: Everitt Amber, MD;  Location: [facility];  Service: Gynecology;  Laterality: N/A;  . HYSTEROSCOPY WITH D & C  12/27/2011   Procedure: DILATATION AND CURETTAGE /HYSTEROSCOPY;  Surgeon: Maeola Sarah. Landry Mellow, MD;  Location: [facility];  Service: Gynecology;;  . I and D of abcess  05/2011  . INTRAUTERINE DEVICE (IUD) INSERTION N/A 08/09/2016   Procedure: INTRAUTERINE DEVICE (IUD) INSERTION;  Surgeon: Everitt Amber, MD;  Location: [facility];  Service: Gynecology;  Laterality: N/A;  . LUNG BIOPSY    . uterine abletion  reports that she has never smoked. She has never used smokeless tobacco. She reports current alcohol use. She reports that she does not use drugs.  Allergies  Allergen Reactions  . Tape Itching    Leads cause Skin burns and irritation after 24 hours+    Family History  Problem Relation Age of Onset  . Diabetes Father   . Cancer Father 79       colon cancer   . Diabetes Brother   . Deep vein thrombosis Mother   . Aneurysm Sister        d/o brain aneurysm     Prior to Admission medications   Medication Sig Start Date End Date Taking? Authorizing Provider  albuterol (PROAIR HFA) 108 (90 Base) MCG/ACT inhaler Inhale 1-2 puffs into the  lungs every 6 (six) hours as needed for wheezing or shortness of breath. 10/29/18  Yes Hongalgi, Lenis Dickinson, MD  atorvastatin (LIPITOR) 40 MG tablet TAKE 1 TABLET (40 MG TOTAL) BY MOUTH DAILY AT 6 PM. 01/30/20  Yes Nafziger, Tommi Rumps, NP  calcium citrate-vitamin D (CITRACAL+D) 315-200 MG-UNIT tablet Take 1 tablet by mouth daily.   Yes [provider]  cetirizine (ZYRTEC) 10 MG tablet Take 10 mg by mouth at bedtime as needed for allergies.   Yes [provider]  famotidine (PEPCID) 40 MG tablet Take 1 tablet (40 mg total) by mouth 2 (two) times daily. 11/13/18 08/10/20 Yes Nafziger, Tommi Rumps, NP  folic acid (FOLVITE) 1 MG tablet Take 1 mg by mouth daily. 01/11/16  Yes [provider]  HYDROcodone-acetaminophen (NORCO) 10-325 MG tablet Take 1 tablet by mouth every 6 (six) hours as needed (pain). 11/21/16  Yes Pete Pelt, PA-C  losartan (COZAAR) 100 MG tablet TAKE 1 TABLET (100 MG TOTAL) DAILY BY MOUTH. 05/29/20  Yes Nafziger, Tommi Rumps, NP  methotrexate 50 MG/2ML injection Inject 20 mg into the vein once a week. Dose is 0.8 mL (20mg ). Take on Fridays   Yes [provider]  metoprolol succinate (TOPROL-XL) 25 MG 24 hr tablet TAKE 1 TABLET BY MOUTH EVERY DAY Patient taking differently: Take 25 mg by mouth daily.  05/29/20  Yes Nafziger, Tommi Rumps, NP  predniSONE (DELTASONE) 10 MG tablet Take 1 tablet (10 mg total) by mouth daily. 01/17/18  Yes Florencia Reasons, MD  torsemide (DEMADEX) 20 MG tablet Take 2 tablets (40 mg total) by mouth daily. Take additional 20 mg for weight gain of 3 lbs in 1 day or 5 lbs in 2 days or swelling or shortness of breath. 08/22/19  Yes Lavina Hamman, MD  triamcinolone acetonide (KENALOG) 40 MG/ML injection Inject 40 mg into the muscle every 3 (three) months. For knee pain   Yes [provider]  triamcinolone ointment (KENALOG) 0.1 % Apply 1 application topically 2 (two) times daily. For Sarcoidosis   Yes [provider]  vitamin B-12 1000 MCG tablet  Take 1 tablet (1,000 mcg total) by mouth daily. 06/19/16  Yes Lavina Hamman, MD  B-D TB SYRINGE 1CC/27GX1/2" 27G X 1/2" 1 ML MISC USE ONCE A WEEK TO INJECT METHOTREXATE 11/05/19   [provider]  NON FORMULARY 2 liter of oxygen    [provider]    Physical Exam: Vitals:   08/10/20 2130 08/10/20 2200 08/10/20 2230 08/10/20 2300  BP: 130/68 133/65 (!) 162/69 (!) 151/72  Pulse: 84 76 (!) 111 79  Resp: (!) 23 (!) 23 (!) 38 (!) 23  Temp:      TempSrc:  SpO2: 98% 99% 93% 99%  Weight:      Height:          Constitutional: Pleasant, morbidly obese, no distress, anasarca Vitals:   08/10/20 2130 08/10/20 2200 08/10/20 2230 08/10/20 2300  BP: 130/68 133/65 (!) 162/69 (!) 151/72  Pulse: 84 76 (!) 111 79  Resp: (!) 23 (!) 23 (!) 38 (!) 23  Temp:      TempSrc:      SpO2: 98% 99% 93% 99%  Weight:      Height:       Eyes: PERRL, lids and conjunctivae normal ENMT: Mucous membranes are moist. Posterior pharynx clear of any exudate or lesions.Normal dentition.  Neck: normal, supple, no masses, no thyromegaly Respiratory: Decreased air entry with bilateral basal crackles normal respiratory effort. No accessory muscle use.  Cardiovascular: Regular rate and rhythm, no murmurs / rubs / gallops.  3+ extremity edema. 2+ pedal pulses. No carotid bruits.  Abdomen: no tenderness, no masses palpated. No hepatosplenomegaly. Bowel sounds positive.  Musculoskeletal: no clubbing / cyanosis. No joint deformity upper and lower extremities. Good ROM, no contractures. Normal muscle tone.  Skin: Chronic stasis dermatitis of the lower extremity no rashes, lesions, ulcers. No induration Neurologic: CN 2-12 grossly intact. Sensation intact, DTR normal. Strength 5/5 in all 4.  Psychiatric: Normal judgment and insight. Alert and oriented x 3. Normal mood.     Labs on Admission: I have personally reviewed following labs and imaging studies  CBC: Recent Labs  Lab 08/10/20 1938  WBC 9.0   NEUTROABS 6.8  HGB 9.2*  HCT 34.3*  MCV 73.6*  PLT 536   Basic Metabolic Panel: Recent Labs  Lab 08/10/20 1938  NA 143  K 4.0  CL 102  CO2 34*  GLUCOSE 98  BUN 16  CREATININE 0.90  CALCIUM 8.8*   GFR: Estimated Creatinine Clearance: 143.5 mL/min (by C-G formula based on SCr of 0.9 mg/dL). Liver Function Tests: Recent Labs  Lab 08/10/20 1938  AST 15  ALT 14  ALKPHOS 42  BILITOT 0.6  PROT 7.2  ALBUMIN 3.6   No results for input(s): LIPASE, AMYLASE in the last 168 hours. No results for input(s): AMMONIA in the last 168 hours. Coagulation Profile: No results for input(s): INR, PROTIME in the last 168 hours. Cardiac Enzymes: No results for input(s): CKTOTAL, CKMB, CKMBINDEX, TROPONINI in the last 168 hours. BNP (last 3 results) No results for input(s): PROBNP in the last 8760 hours. HbA1C: No results for input(s): HGBA1C in the last 72 hours. CBG: No results for input(s): GLUCAP in the last 168 hours. Lipid Profile: No results for input(s): CHOL, HDL, LDLCALC, TRIG, CHOLHDL, LDLDIRECT in the last 72 hours. Thyroid Function Tests: No results for input(s): TSH, T4TOTAL, FREET4, T3FREE, THYROIDAB in the last 72 hours. Anemia Panel: No results for input(s): VITAMINB12, FOLATE, FERRITIN, TIBC, IRON, RETICCTPCT in the last 72 hours. Urine analysis:    Component Value Date/Time   COLORURINE STRAW (A) 08/16/2019 1903   APPEARANCEUR CLEAR 08/16/2019 1903   LABSPEC 1.015 08/16/2019 1903   PHURINE 6.0 08/16/2019 1903   GLUCOSEU NEGATIVE 08/16/2019 1903   HGBUR LARGE (A) 08/16/2019 1903   BILIRUBINUR NEGATIVE 08/16/2019 1903   KETONESUR NEGATIVE 08/16/2019 1903   PROTEINUR NEGATIVE 08/16/2019 1903   UROBILINOGEN 0.2 09/10/2012 0106   NITRITE NEGATIVE 08/16/2019 1903   LEUKOCYTESUR NEGATIVE 08/16/2019 1903   Sepsis Labs: @LABRCNTIP (procalcitonin:4,lacticidven:4) ) Recent Results (from the past 240 hour(s))  Respiratory Panel by RT PCR (Flu A&B, Covid) -  Nasopharyngeal Swab     Status: None   Collection Time: 08/10/20  7:45 PM   Specimen: Nasopharyngeal Swab  Result Value Ref Range Status   SARS Coronavirus 2 by RT PCR NEGATIVE NEGATIVE Final    Comment: (NOTE) SARS-CoV-2 target nucleic acids are NOT DETECTED.  The SARS-CoV-2 RNA is generally detectable in upper respiratoy specimens during the acute phase of infection. The lowest concentration of SARS-CoV-2 viral copies this assay can detect is 131 copies/mL. A negative result does not preclude SARS-Cov-2 infection and should not be used as the sole basis for treatment or other patient management decisions. A negative result may occur with  improper specimen collection/handling, submission of specimen other than nasopharyngeal swab, presence of viral mutation(s) within the areas targeted by this assay, and inadequate number of viral copies (<131 copies/mL). A negative result must be combined with clinical observations, patient history, and epidemiological information. The expected result is Negative.  Fact Sheet for Patients:  PinkCheek.be  Fact Sheet for Healthcare Providers:  GravelBags.it  This test is no t yet approved or cleared by the [place] FDA and  has been authorized for detection and/or diagnosis of SARS-CoV-2 by FDA under an Emergency Use Authorization (EUA). This EUA will remain  in effect (meaning this test can be used) for the duration of the COVID-19 declaration under Section 564(b)(1) of the Act, 21 U.S.C. section 360bbb-3(b)(1), unless the authorization is terminated or revoked sooner.     Influenza A by PCR NEGATIVE NEGATIVE Final   Influenza B by PCR NEGATIVE NEGATIVE Final    Comment: (NOTE) The Xpert Xpress SARS-CoV-2/FLU/RSV assay is intended as an aid in  the diagnosis of influenza from Nasopharyngeal swab specimens and  should not be used as a sole basis for treatment. Nasal washings and   aspirates are unacceptable for Xpert Xpress SARS-CoV-2/FLU/RSV  testing.  Fact Sheet for Patients: PinkCheek.be  Fact Sheet for Healthcare Providers: GravelBags.it  This test is not yet approved or cleared by the [place] FDA and  has been authorized for detection and/or diagnosis of SARS-CoV-2 by  FDA under an Emergency Use Authorization (EUA). This EUA will remain  in effect (meaning this test can be used) for the duration of the  Covid-19 declaration under Section 564(b)(1) of the Act, 21  U.S.C. section 360bbb-3(b)(1), unless the authorization is  terminated or revoked. Performed at [facility], [place] [address], [place], [place] [address]      Radiological Exams on Admission: DG Chest 2 View  Result Date: 08/10/2020 CLINICAL DATA:  54 year old female with increasing shortness of breath for 2 weeks. Fluid retention in the extremities. EXAM: CHEST - 2 VIEW COMPARISON:  Chest CTA 08/16/2019 and earlier. FINDINGS: AP semi upright and lateral views. Stable cardiomegaly and mediastinal contours. Visualized tracheal air column is within normal limits. No pneumothorax or layering pleural effusion. Pulmonary interstitial opacity/indistinctness of pulmonary vasculature appears unchanged from last year. There is trace pleural fluid in the right minor fissure. No consolidation. No acute osseous abnormality identified. Paucity bowel gas in the upper abdomen. IMPRESSION: Cardiomegaly with indistinct pulmonary vasculature and trace fluid in the right minor fissure. Suspect acute interstitial edema. Electronically Signed   By: Genevie Ann M.D.   On: 08/10/2020 19:01    EKG: Independently reviewed.  Sinus rhythm with no significant ST changes.  Assessment/Plan Principal Problem:   Acute on chronic diastolic (congestive) heart failure (HCC) Active Problems:   OSA (obstructive sleep apnea)   Obesity hypoventilation  syndrome (  [place])   Hypertension   Sarcoidosis   Iron deficiency anemia due to chronic blood loss   Hypoxemia   GERD (gastroesophageal reflux disease)     #1 acute on chronic diastolic heart failure: Patient will be admitted and aggressively diuresed with IV Lasix.  Continue her home regimen.  Hold Bumex.  Her last echocardiogram was a year ago so we will repeat and monitor.  #2 obstructive sleep apnea: Patient on CPAP at home.  Initiate CPAP and monitor.  #3 obesity hypoventilation syndrome: Continue oxygen and supportive care.  #4 GERD: Continue PPIs.  #5 hypertension: Continue blood pressure control.  #6 sarcoidosis: On chronic prednisone.  Continue  #7 iron deficiency anemia: Patient will not require transfusion at the moment.  Hemoglobin above 9.  Continue outpatient management and treatment.   DVT prophylaxis: Lovenox Code Status: Full code Family Communication: No family at bedside Disposition Plan: Home Consults called: None Admission status: Inpatient  Severity of Illness: The appropriate patient status for this patient is INPATIENT. Inpatient status is judged to be reasonable and necessary in order to provide the required intensity of service to ensure the patient's safety. The patient's presenting symptoms, physical exam findings, and initial radiographic and laboratory data in the context of their chronic comorbidities is felt to place them at high risk for further clinical deterioration. Furthermore, it is not anticipated that the patient will be medically stable for discharge from the hospital within 2 midnights of admission. The following factors support the patient status of inpatient.   " The patient's presenting symptoms include shortness of breath and weight gain. " The worrisome physical exam findings include anasarca. " The initial radiographic and laboratory data are worrisome because of evidence of pulmonary edema. " The chronic co-morbidities include  diastolic CHF.   * I certify that at the point of admission it is my clinical judgment that the patient will require inpatient hospital care spanning beyond 2 midnights from the point of admission due to high intensity of service, high risk for further deterioration and high frequency of surveillance required.Barbette Merino MD [facility] Pager [phone]  If 7PM-7AM, please contact night-coverage [email or web address] Password [facility]  08/10/2020, 11:32 PM

## 2020-08-11 ENCOUNTER — Inpatient Hospital Stay (HOSPITAL_COMMUNITY): Payer: Medicare Other

## 2020-08-11 DIAGNOSIS — I5031 Acute diastolic (congestive) heart failure: Secondary | ICD-10-CM

## 2020-08-11 DIAGNOSIS — I5033 Acute on chronic diastolic (congestive) heart failure: Secondary | ICD-10-CM

## 2020-08-11 LAB — BASIC METABOLIC PANEL
Anion gap: 11 (ref 5–15)
BUN: 14 mg/dL (ref 6–20)
CO2: 31 mmol/L (ref 22–32)
Calcium: 8.7 mg/dL — ABNORMAL LOW (ref 8.9–10.3)
Chloride: 99 mmol/L (ref 98–111)
Creatinine, Ser: 0.91 mg/dL (ref 0.44–1.00)
GFR, Estimated: >60 mL/min (ref >60)
Glucose, Bld: 98 mg/dL (ref 70–99)
Potassium: 3.9 mmol/L (ref 3.5–5.1)
Sodium: 141 mmol/L (ref 135–145)

## 2020-08-11 LAB — CBC
HCT: 33.7 % — ABNORMAL LOW (ref 36.0–46.0)
Hemoglobin: 8.9 g/dL — ABNORMAL LOW (ref 12.0–15.0)
MCH: 19.6 pg — ABNORMAL LOW (ref 26.0–34.0)
MCHC: 26.4 g/dL — ABNORMAL LOW (ref 30.0–36.0)
MCV: 74.4 fL — ABNORMAL LOW (ref 80.0–100.0)
Platelets: 191 10*3/uL (ref 150–400)
RBC: 4.53 MIL/uL (ref 3.87–5.11)
RDW: 24.7 % — ABNORMAL HIGH (ref 11.5–15.5)
WBC: 7.8 10*3/uL (ref 4.0–10.5)
nRBC: 0 % (ref 0.0–0.2)

## 2020-08-11 LAB — ECHOCARDIOGRAM COMPLETE
Area-P 1/2: 5.27 cm2
Height: 66 in
S' Lateral: 3.15 cm
Weight: 8080 oz

## 2020-08-11 LAB — TROPONIN I (HIGH SENSITIVITY): Troponin I (High Sensitivity): 6 ng/L (ref <18)

## 2020-08-11 LAB — HIV ANTIBODY (ROUTINE TESTING W REFLEX): HIV Screen 4th Generation wRfx: NONREACTIVE

## 2020-08-11 MED ORDER — METHOTREXATE SODIUM CHEMO INJECTION 50 MG/2ML: 20.0000 mg | INTRAMUSCULAR | Status: DC

## 2020-08-11 MED ORDER — ENOXAPARIN SODIUM 100 MG/ML ~~LOC~~ SOLN: 100.0000 mg | SUBCUTANEOUS | Status: DC

## 2020-08-11 MED ORDER — ATORVASTATIN CALCIUM 40 MG PO TABS
40.0000 mg | ORAL_TABLET | Freq: Every day | ORAL | Status: DC
  Administered 2020-08-11 – 2020-08-15 (×5): 40 mg via ORAL
  Filled 2020-08-11 (×5): qty 1

## 2020-08-11 MED ORDER — INFLUENZA VAC SPLIT QUAD 0.5 ML IM SUSY: 0.5000 mL | PREFILLED_SYRINGE | INTRAMUSCULAR | Status: DC

## 2020-08-11 MED ORDER — SODIUM CHLORIDE 0.9% FLUSH
3.0000 mL | Freq: Two times a day (BID) | INTRAVENOUS | Status: DC
  Administered 2020-08-11 – 2020-08-16 (×10): 3 mL via INTRAVENOUS

## 2020-08-11 MED ORDER — PERFLUTREN LIPID MICROSPHERE
1.0000 mL | INTRAVENOUS | Status: AC | PRN
  Administered 2020-08-11: 2 mL via INTRAVENOUS
  Filled 2020-08-11: qty 10

## 2020-08-11 MED ORDER — ORAL CARE MOUTH RINSE
15.0000 mL | Freq: Two times a day (BID) | OROMUCOSAL | Status: DC
  Administered 2020-08-12 – 2020-08-16 (×5): 15 mL via OROMUCOSAL

## 2020-08-11 MED ORDER — ALBUTEROL SULFATE HFA 108 (90 BASE) MCG/ACT IN AERS: 1.0000 | INHALATION_SPRAY | Freq: Four times a day (QID) | RESPIRATORY_TRACT | Status: DC | PRN

## 2020-08-11 MED ORDER — VITAMIN B-12 1000 MCG PO TABS
1000.0000 ug | ORAL_TABLET | Freq: Every day | ORAL | Status: DC
  Administered 2020-08-11 – 2020-08-16 (×6): 1000 ug via ORAL
  Filled 2020-08-11 (×6): qty 1

## 2020-08-11 MED ORDER — SODIUM CHLORIDE 0.9 % IV SOLN: 250.0000 mL | INTRAVENOUS | Status: DC | PRN

## 2020-08-11 MED ORDER — ENOXAPARIN SODIUM 40 MG/0.4ML ~~LOC~~ SOLN
40.0000 mg | SUBCUTANEOUS | Status: DC
  Administered 2020-08-11: 40 mg via SUBCUTANEOUS
  Filled 2020-08-11: qty 0.4

## 2020-08-11 MED ORDER — FUROSEMIDE 10 MG/ML IJ SOLN
60.0000 mg | Freq: Two times a day (BID) | INTRAMUSCULAR | Status: DC
  Administered 2020-08-11 – 2020-08-13 (×6): 60 mg via INTRAVENOUS
  Filled 2020-08-11 (×5): qty 6
  Filled 2020-08-11: qty 8

## 2020-08-11 MED ORDER — TRIAMCINOLONE ACETONIDE 0.1 % EX OINT
1.0000 "application " | TOPICAL_OINTMENT | Freq: Two times a day (BID) | CUTANEOUS | Status: DC
  Administered 2020-08-13 – 2020-08-15 (×4): 1 via TOPICAL
  Filled 2020-08-11: qty 80

## 2020-08-11 MED ORDER — SODIUM CHLORIDE 0.9% FLUSH
3.0000 mL | INTRAVENOUS | Status: DC | PRN
  Administered 2020-08-11: 3 mL via INTRAVENOUS

## 2020-08-11 MED ORDER — ACETAMINOPHEN 325 MG PO TABS
650.0000 mg | ORAL_TABLET | ORAL | Status: DC | PRN
  Administered 2020-08-11 – 2020-08-12 (×2): 650 mg via ORAL
  Filled 2020-08-11 (×2): qty 2

## 2020-08-11 MED ORDER — CALCIUM CARBONATE-VITAMIN D 500-200 MG-UNIT PO TABS
1.0000 | ORAL_TABLET | Freq: Every day | ORAL | Status: DC
  Administered 2020-08-11 – 2020-08-16 (×6): 1 via ORAL
  Filled 2020-08-11 (×6): qty 1

## 2020-08-11 MED ORDER — FAMOTIDINE 20 MG PO TABS
40.0000 mg | ORAL_TABLET | Freq: Two times a day (BID) | ORAL | Status: DC
  Administered 2020-08-11 – 2020-08-16 (×11): 40 mg via ORAL
  Filled 2020-08-11 (×11): qty 2

## 2020-08-11 MED ORDER — HYDROCODONE-ACETAMINOPHEN 10-325 MG PO TABS
1.0000 | ORAL_TABLET | Freq: Four times a day (QID) | ORAL | Status: DC | PRN
  Administered 2020-08-13 – 2020-08-16 (×6): 1 via ORAL
  Filled 2020-08-11 (×6): qty 1

## 2020-08-11 MED ORDER — CALCIUM CITRATE-VITAMIN D 315-200 MG-UNIT PO TABS: 1.0000 | ORAL_TABLET | Freq: Every day | ORAL | Status: DC

## 2020-08-11 MED ORDER — TRIAMCINOLONE ACETONIDE 40 MG/ML IJ SUSP: 40.0000 mg | INTRAMUSCULAR | Status: DC

## 2020-08-11 MED ORDER — PREDNISONE 10 MG PO TABS
10.0000 mg | ORAL_TABLET | Freq: Every day | ORAL | Status: DC
  Administered 2020-08-11 – 2020-08-16 (×6): 10 mg via ORAL
  Filled 2020-08-11 (×6): qty 1

## 2020-08-11 MED ORDER — LOSARTAN POTASSIUM 50 MG PO TABS
100.0000 mg | ORAL_TABLET | Freq: Every day | ORAL | Status: DC
  Administered 2020-08-11 – 2020-08-16 (×6): 100 mg via ORAL
  Filled 2020-08-11 (×6): qty 2

## 2020-08-11 MED ORDER — LORATADINE 10 MG PO TABS
10.0000 mg | ORAL_TABLET | Freq: Every day | ORAL | Status: DC
  Administered 2020-08-11 – 2020-08-16 (×6): 10 mg via ORAL
  Filled 2020-08-11 (×6): qty 1

## 2020-08-11 MED ORDER — ONDANSETRON HCL 4 MG/2ML IJ SOLN
4.0000 mg | Freq: Four times a day (QID) | INTRAMUSCULAR | Status: DC | PRN
  Administered 2020-08-11: 4 mg via INTRAVENOUS
  Filled 2020-08-11: qty 2

## 2020-08-11 MED ORDER — FOLIC ACID 1 MG PO TABS
1.0000 mg | ORAL_TABLET | Freq: Every day | ORAL | Status: DC
  Administered 2020-08-11 – 2020-08-16 (×6): 1 mg via ORAL
  Filled 2020-08-11 (×6): qty 1

## 2020-08-11 MED ORDER — METOPROLOL SUCCINATE ER 25 MG PO TB24
25.0000 mg | ORAL_TABLET | Freq: Every day | ORAL | Status: DC
  Administered 2020-08-11 – 2020-08-16 (×6): 25 mg via ORAL
  Filled 2020-08-11 (×6): qty 1

## 2020-08-11 NOTE — Progress Notes (Signed)
PROGRESS NOTE    [name]  OEV:[id number] DOB: 08-06-1966 DOA: 08/10/2020 PCP: [name], NP  Chief Complaint  Patient presents with  . Shortness of Breath   Brief Narrative:  [name] is [name] 54 y.o. female with medical history significant of diastolic CHF, iron deficiency anemia, COPD, morbidly obese, GERD, sarcoidosis, obstructive sleep apnea, hypothyroidism and hypertension who presented to the ER with worsening shortness of breath and increased weight in the last month.  She has gained about 15 pounds.  Patient normally is on 2 L of oxygen at home and uses CPAP at night.  She was seen in the ER with slight increase in oxygen requirement.  She has history of COPD but no wheezing.  She is currently requiring 3 L of oxygen.  She is also notably having significant edema.  Patient has been taking oral Bumex at home but has not been having significant response.  She is being admitted for IV diuretics and reevaluation.  She had her last echocardiogram [name] year ago where the EF was 55 to 60%.  She denied any chest pain.  Denied any recent cardiac work-up.  She has sarcoidosis and is on chronic prednisone..  ED Course: Temperature 98 blood pressure 170/75 pulse 111 respirate of 38 oxygen sats 93% on 3 L.  White count is 9.0 hemoglobin 9.1 platelets 191.  Sodium 143 potassium 4.0 chloride 102 CO2 34 BUN 16 creatinine 0.90 calcium 8.8.  Respiratory panel is negative.  Fecal occult blood testing negative x2.  PT/INR 14 and 1.06.  Chest x-ray showed cardiomegaly with pulmonary vascular and trace fluid in the right effusion acute interstitial edema.  Patient being admitted for evaluation and treatment  Assessment & Plan:   Principal Problem:   Acute on chronic diastolic (congestive) heart failure (HCC) Active Problems:   OSA (obstructive sleep apnea)   Obesity hypoventilation syndrome (HCC)   Hypertension   Sarcoidosis   Iron deficiency anemia due to chronic blood loss    Hypoxemia   GERD (gastroesophageal reflux disease)  #1 HFpEF Exacerbation  Right Sided Heart Failure  Pulmonary Hypertension:  EF 55-60%, grade II diastolic dysfunction, RV pressure overload, mildly reduced RV systolic function, moderately elevated PASP (see report) Continue lasix 60 mg IV BID (she's on 40 mg bumex at home daily) Cardiology c/s, recommending continuing diuresis -> elevated PA pressures WHO 2/3, no recommendation for [facility] cath, continue cpap  Strict I/O, daily weights  Wt Readings from Last 3 Encounters:  08/10/20 (!) 229.1 kg  01/24/20 (!) 230.4 kg  12/05/19 (!) 230.1 kg   #2 obstructive sleep apnea: Patient on CPAP at home.  Initiate CPAP and monitor.  #3 obesity hypoventilation syndrome: Continue oxygen and supportive care.  #4 GERD: Continue PPIs.  #5 hypertension: losartan, metoprolol, diuretics  #6 sarcoidosis: On chronic prednisone.  Continue  #7 iron deficiency anemia: Patient will not require transfusion at the moment.  Hemoglobin above 9. Follow iron, b12, folate, ferritin  DVT prophylaxis: lovenox Code Status: full  Family Communication: none at bedside Disposition:   Status is: Inpatient  Remains inpatient appropriate because:Inpatient level of care appropriate due to severity of illness   Dispo: The patient is from: Home              Anticipated d/c is to: Home              Anticipated d/c date is: > 3 days  Patient currently is not medically stable to d/c.       Consultants:   cardiology  Procedures: Echo IMPRESSIONS    1. Left ventricular ejection fraction, by estimation, is 55 to 60%. The  left ventricle has normal function. The left ventricle has no regional  wall motion abnormalities. There is mild concentric left ventricular  hypertrophy. Left ventricular diastolic  parameters are consistent with Grade II diastolic dysfunction  (pseudonormalization). There is the interventricular septum is flattened  in  systole, consistent with right ventricular pressure overload.  2. Right ventricular systolic function is mildly reduced. The right  ventricular size is mildly enlarged. There is moderately elevated  pulmonary artery systolic pressure.  3. The mitral valve is normal in structure. No evidence of mitral valve  regurgitation.  4. The aortic valve is normal in structure. Aortic valve regurgitation is  not visualized.   Antimicrobials: Anti-infectives (From admission, onward)   None     Subjective: No new complaints Weight gain and SOB  Objective: Vitals:   08/11/20 1000 08/11/20 1030 08/11/20 1301 08/11/20 1430  BP: 137/67 133/71 126/71 (!) 143/74  Pulse: 97 87 83 84  Resp: (!) 23 20 20 20   Temp:      TempSrc:      SpO2: 93% 92% 96% 92%  Weight:      Height:        Intake/Output Summary (Last 24 hours) at 08/11/2020 1518 Last data filed at 08/11/2020 0948 Gross per 24 hour  Intake 3 ml  Output --  Net 3 ml   Filed Weights   08/10/20 1802  Weight: (!) 229.1 kg    Examination:  General: No acute distress. Obese. Cardiovascular: Heart sounds show Teige Rountree regular rate, and rhythm Lungs: unlabored Abdomen: Soft, nontender, nondistended Neurological: Alert and oriented 3. Moves all extremities 4 . Cranial nerves II through XII grossly intact. Skin: Warm and dry. No rashes or lesions. Extremities: bilateral LE edema   Data Reviewed: I have personally reviewed following labs and imaging studies  CBC: Recent Labs  Lab 08/10/20 1938 08/11/20 0437  WBC 9.0 7.8  NEUTROABS 6.8  --   HGB 9.2* 8.9*  HCT 34.3* 33.7*  MCV 73.6* 74.4*  PLT 191 124    Basic Metabolic Panel: Recent Labs  Lab 08/10/20 1938 08/11/20 0437  NA 143 141  K 4.0 3.9  CL 102 99  CO2 34* 31  GLUCOSE 98 98  BUN 16 14  CREATININE 0.90 0.91  CALCIUM 8.8* 8.7*    GFR: Estimated Creatinine Clearance: 141.9 mL/min (by C-G formula based on SCr of 0.91 mg/dL).  Liver Function Tests: Recent  Labs  Lab 08/10/20 1938  AST 15  ALT 14  ALKPHOS 42  BILITOT 0.6  PROT 7.2  ALBUMIN 3.6    CBG: No results for input(s): GLUCAP in the last 168 hours.   Recent Results (from the past 240 hour(s))  Respiratory Panel by RT PCR (Flu Randal Yepiz&B, Covid) - Nasopharyngeal Swab     Status: None   Collection Time: 08/10/20  7:45 PM   Specimen: Nasopharyngeal Swab  Result Value Ref Range Status   SARS Coronavirus 2 by RT PCR NEGATIVE NEGATIVE Final    Comment: (NOTE) SARS-CoV-2 target nucleic acids are NOT DETECTED.  The SARS-CoV-2 RNA is generally detectable in upper respiratoy specimens during the acute phase of infection. The lowest concentration of SARS-CoV-2 viral copies this assay can detect is 131 copies/mL. Kristine Chahal negative result does not preclude SARS-Cov-2 infection and should  not be used as the sole basis for treatment or other patient management decisions. Priya Matsen negative result may occur with  improper specimen collection/handling, submission of specimen other than nasopharyngeal swab, presence of viral mutation(s) within the areas targeted by this assay, and inadequate number of viral copies (<131 copies/mL). Natsuko Kelsay negative result must be combined with clinical observations, patient history, and epidemiological information. The expected result is Negative.  Fact Sheet for Patients:  PinkCheek.be  Fact Sheet for Healthcare Providers:  GravelBags.it  This test is no t yet approved or cleared by the [place] FDA and  has been authorized for detection and/or diagnosis of SARS-CoV-2 by FDA under an Emergency Use Authorization (EUA). This EUA will remain  in effect (meaning this test can be used) for the duration of the COVID-19 declaration under Section 564(b)(1) of the Act, 21 U.S.C. section 360bbb-3(b)(1), unless the authorization is terminated or revoked sooner.     Influenza Hershel Corkery by PCR NEGATIVE NEGATIVE Final   Influenza B  by PCR NEGATIVE NEGATIVE Final    Comment: (NOTE) The Xpert Xpress SARS-CoV-2/FLU/RSV assay is intended as an aid in  the diagnosis of influenza from Nasopharyngeal swab specimens and  should not be used as Siris Hoos sole basis for treatment. Nasal washings and  aspirates are unacceptable for Xpert Xpress SARS-CoV-2/FLU/RSV  testing.  Fact Sheet for Patients: PinkCheek.be  Fact Sheet for Healthcare Providers: GravelBags.it  This test is not yet approved or cleared by the [place] FDA and  has been authorized for detection and/or diagnosis of SARS-CoV-2 by  FDA under an Emergency Use Authorization (EUA). This EUA will remain  in effect (meaning this test can be used) for the duration of the  Covid-19 declaration under Section 564(b)(1) of the Act, 21  U.S.C. section 360bbb-3(b)(1), unless the authorization is  terminated or revoked. Performed at [facility], [place] [address], [place], [place] [address]          Radiology Studies: DG Chest 2 View  Result Date: 08/10/2020 CLINICAL DATA:  55 year old female with increasing shortness of breath for 2 weeks. Fluid retention in the extremities. EXAM: CHEST - 2 VIEW COMPARISON:  Chest CTA 08/16/2019 and earlier. FINDINGS: AP semi upright and lateral views. Stable cardiomegaly and mediastinal contours. Visualized tracheal air column is within normal limits. No pneumothorax or layering pleural effusion. Pulmonary interstitial opacity/indistinctness of pulmonary vasculature appears unchanged from last year. There is trace pleural fluid in the right minor fissure. No consolidation. No acute osseous abnormality identified. Paucity bowel gas in the upper abdomen. IMPRESSION: Cardiomegaly with indistinct pulmonary vasculature and trace fluid in the right minor fissure. Suspect acute interstitial edema. Electronically Signed   By: Genevie Ann M.D.   On: 08/10/2020 19:01    ECHOCARDIOGRAM COMPLETE  Result Date: 08/11/2020    ECHOCARDIOGRAM REPORT   Patient Name:   [name] Date of Exam: 08/11/2020 Medical Rec #:  [id number]             Height:       66.0 in Accession #:    [id number]            Weight:       505.0 lb Date of Birth:  11-22-1965             BSA:          2.964 m Patient Age:    10 years              BP:  139/71 mmHg Patient Gender: F                     HR:           81 bpm. Exam Location:  Inpatient Procedure: 2D Echo and Intracardiac Opacification Agent Indications:    CHF-Acute Diastolic 941.74 / Y81.44  History:        Patient has prior history of Echocardiogram examinations, most                 recent 08/17/2019. CHF, COPD, Signs/Symptoms:Shortness of                 Breath; Risk Factors:Former Smoker, Sleep Apnea and                 Hypertension. Iron deficiency anemia, GERD, Sarcoidosis,                 Hypothyroidsm.  Sonographer:    Darlina Sicilian RDCS Referring Phys: [phone] [facility] Julious Oka  Sonographer Comments: Image acquisition challenging due to patient body habitus. IMPRESSIONS  1. Left ventricular ejection fraction, by estimation, is 55 to 60%. The left ventricle has normal function. The left ventricle has no regional wall motion abnormalities. There is mild concentric left ventricular hypertrophy. Left ventricular diastolic parameters are consistent with Grade II diastolic dysfunction (pseudonormalization). There is the interventricular septum is flattened in systole, consistent with right ventricular pressure overload.  2. Right ventricular systolic function is mildly reduced. The right ventricular size is mildly enlarged. There is moderately elevated pulmonary artery systolic pressure.  3. The mitral valve is normal in structure. No evidence of mitral valve regurgitation.  4. The aortic valve is normal in structure. Aortic valve regurgitation is not visualized. Comparison(s): Daveon Arpino prior study was performed on 08/17/2019. Prior  images reviewed side by side. The right ventricular hypertrophy is worse. There is also more prominent systolic flattening of the ventricular septum and worsened pulmonary artery hypertension and increased right atrial pressure. There is also prominent respiratory septal displacement; in the absence of other signs of constrictive physiology aor pericardial tamponade, this is likely due to markedly increased work of breathing (e.g. asthma/COPD exacerbation, severe obesity, etc.). FINDINGS  Left Ventricle: Left ventricular ejection fraction, by estimation, is 55 to 60%. The left ventricle has normal function. The left ventricle has no regional wall motion abnormalities. Definity contrast agent was given IV to delineate the left ventricular  endocardial borders. The left ventricular internal cavity size was normal in size. There is mild concentric left ventricular hypertrophy. The interventricular septum is flattened in systole, consistent with right ventricular pressure overload. Left ventricular diastolic parameters are consistent with Grade II diastolic dysfunction (pseudonormalization). Indeterminate filling pressures. Right Ventricle: The right ventricular size is mildly enlarged. No increase in right ventricular wall thickness. Right ventricular systolic function is mildly reduced. There is moderately elevated pulmonary artery systolic pressure. The tricuspid regurgitant velocity is 3.14 m/s, and with an assumed right atrial pressure of 15 mmHg, the estimated right ventricular systolic pressure is 63.1 mmHg. Left Atrium: Left atrial size was normal in size. Right Atrium: Right atrial size was not well visualized. Pericardium: There is no evidence of pericardial effusion. Mitral Valve: The mitral valve is normal in structure. No evidence of mitral valve regurgitation. Tricuspid Valve: The tricuspid valve is grossly normal. Tricuspid valve regurgitation is trivial. Aortic Valve: The aortic valve is normal in  structure. Aortic valve regurgitation is not visualized. Pulmonic Valve: The pulmonic valve was not well  visualized. Pulmonic valve regurgitation is not visualized. Aorta: The aortic root and ascending aorta are structurally normal, with no evidence of dilitation. IAS/Shunts: The interatrial septum was not well visualized.  LEFT VENTRICLE PLAX 2D LVIDd:         5.00 cm  Diastology LVIDs:         3.15 cm  LV e' medial:    6.74 cm/s LV PW:         1.15 cm  LV E/e' medial:  10.9 LV IVS:        1.30 cm  LV e' lateral:   5.00 cm/s LVOT diam:     2.00 cm  LV E/e' lateral: 14.7 LV SV:         47 LV SV Index:   16 LVOT Area:     3.14 cm  LEFT ATRIUM           Index LA diam:      3.65 cm 1.23 cm/m LA Vol (A4C): 39.7 ml 13.39 ml/m  AORTIC VALVE LVOT Vmax:   77.00 cm/s LVOT Vmean:  53.500 cm/s LVOT VTI:    0.149 m  AORTA Ao Root diam: 2.70 cm MITRAL VALVE               TRICUSPID VALVE MV Area (PHT): 5.27 cm    TR Peak grad:   39.4 mmHg MV Decel Time: 144 msec    TR Vmax:        314.00 cm/s MV E velocity: 73.70 cm/s MV Teiara Baria velocity: 46.70 cm/s  SHUNTS MV E/Ayane Delancey ratio:  1.58        Systemic VTI:  0.15 m                            Systemic Diam: 2.00 cm Dani Gobble Croitoru MD Electronically signed by Sanda Klein MD Signature Date/Time: 08/11/2020/8:45:05 AM    Final         Scheduled Meds: . atorvastatin  40 mg Oral q1800  . calcium-vitamin D  1 tablet Oral Q breakfast  . [START ON 08/12/2020] enoxaparin (LOVENOX) injection  100 mg Subcutaneous Q24H  . famotidine  40 mg Oral BID  . folic acid  1 mg Oral Daily  . furosemide  60 mg Intravenous BID  . [START ON 08/12/2020] influenza vac split quadrivalent PF  0.5 mL Intramuscular Tomorrow-1000  . loratadine  10 mg Oral Daily  . losartan  100 mg Oral Daily  . [START ON 08/14/2020] methotrexate  20 mg Intravenous Weekly  . metoprolol succinate  25 mg Oral Daily  . predniSONE  10 mg Oral Daily  . sodium chloride flush  3 mL Intravenous Q12H  . triamcinolone acetonide   40 mg Intramuscular Q90 days  . triamcinolone ointment  1 application Topical BID  . cyanocobalamin  1,000 mcg Oral Daily   Continuous Infusions: . sodium chloride       LOS: 1 day    Time spent: over 30 min    Fayrene Helper, MD [facility]   To contact the attending provider between 7A-7P or the covering provider during after hours 7P-7A, please log into the web site [email or web address] and access using universal [place] password for that web site. If you do not have the password, please call the hospital operator.  08/11/2020, 3:18 PM

## 2020-08-11 NOTE — Consult Note (Signed)
CARDIOLOGY CONSULT NOTE       Patient ID: CAELEY DOHRMANN MRN: [id number] DOB/AGE: June 22, 1966 54 y.o.  Admit date: 08/10/2020 Referring Physician: Florene Glen Primary Physician: [name], NP Primary Cardiologist: [name] Reason for Consultation: CHF  Principal Problem:   Acute on chronic diastolic (congestive) heart failure ([place]) Active Problems:   OSA (obstructive sleep apnea)   Obesity hypoventilation syndrome ([place])   Hypertension   Sarcoidosis   Iron deficiency anemia due to chronic blood loss   Hypoxemia   GERD (gastroesophageal reflux disease)   HPI:  54 y.o. morbidly obese black female with OSA, HTN, hypothyroidism and sarcoid. She carries a diagnosis of diastolic heart dysfunction with history of fluid overload. She usually does not leave house. Does not cook and has poor diet Home health aid sometimes provides breakfast. Compliant with meds and CPAP. Noted increased weight and dyspnea last 2 weeks. No chest pain palpitations or syncope She has been compliant with daily demadex 40 mg but does not routinely take extra when her weight is up . She says she urinates less with higher doses of diuretic paradoxically Reviewed her echo done today and EF 55-60% mild LVH and grade 2 diastolic dysfunction with elevated EDP no valve disease suggestion of elevated PA pressures 55 mmgH  ROS All other systems reviewed and negative except as noted above  Past Medical History:  Diagnosis Date  . Anemia   . Arthritis    hands, shoulders, no meds  . CHF (congestive heart failure) ([facility])    EF60-65%  . Chronic hyperventilation syndrome    w/ obesity tx with albuterol inhaler and oxygen 2L  . COPD (chronic obstructive pulmonary disease) ([place])    uses oxygen 2 L  . Gallstones   . GERD (gastroesophageal reflux disease)    diet controlled - no meds  . H/O hiatal hernia   . Hypertension   . Hypothyroidism   . Kidney stones   . Morbid obesity ([place])   . Pneumonia    hoispitalized  in 08/2011  . Sarcoidosis   . Seasonal allergies   . Sleep apnea    uses CPAP machine     Family History  Problem Relation Age of Onset  . Diabetes Father   . Cancer Father 19       colon cancer   . Diabetes Brother   . Deep vein thrombosis Mother   . Aneurysm Sister        d/o brain aneurysm    Social History   Socioeconomic History  . Marital status: Single    Spouse name: Not on file  . Number of children: 1  . Years of education: 92  . Highest education level: Bachelor's degree (e.g., BA, AB, BS)  Occupational History  . Occupation: disability   Tobacco Use  . Smoking status: Never Smoker  . Smokeless tobacco: Never Used  Vaping Use  . Vaping Use: Never used  Substance and Sexual Activity  . Alcohol use: Yes    Comment: occasionally  . Drug use: No  . Sexual activity: Never    Birth control/protection: None  Other Topics Concern  . Not on file  Social History Narrative   Son lives with patient.   Divorced for eight or nine years   disabled   Social Determinants of Radio broadcast assistant Strain: Low Risk   . Difficulty of Paying Living Expenses: Not very hard  Food Insecurity: No Food Insecurity  . Worried About Charity fundraiser in  the Last Year: Never true  . Ran Out of Food in the Last Year: Never true  Transportation Needs: No Transportation Needs  . Lack of Transportation (Medical): No  . Lack of Transportation (Non-Medical): No  Physical Activity: Inactive  . Days of Exercise per Week: 0 days  . Minutes of Exercise per Session: 0 min  Stress: No Stress Concern Present  . Feeling of Stress : Only a little  Social Connections: Unknown  . Frequency of Communication with Friends and Family: More than three times a week  . Frequency of Social Gatherings with Friends and Family: More than three times a week  . Attends Religious Services: More than 4 times per year  . Active Member of Clubs or Organizations: Yes  . Attends Archivist  Meetings: More than 4 times per year  . Marital Status: Not on file  Intimate Partner Violence:   . Fear of Current or Ex-Partner: Not on file  . Emotionally Abused: Not on file  . Physically Abused: Not on file  . Sexually Abused: Not on file    Past Surgical History:  Procedure Laterality Date  . [place]   x 1  . CHOLECYSTECTOMY  2000  . DILATION AND CURETTAGE OF UTERUS N/A 08/09/2016   Procedure: DILATATION AND CURETTAGE;  Surgeon: Everitt Amber, MD;  Location: [facility];  Service: Gynecology;  Laterality: N/A;  . HYSTEROSCOPY WITH D & C  12/27/2011   Procedure: DILATATION AND CURETTAGE /HYSTEROSCOPY;  Surgeon: Maeola Sarah. Landry Mellow, MD;  Location: [facility];  Service: Gynecology;;  . I and D of abcess  05/2011  . INTRAUTERINE DEVICE (IUD) INSERTION N/A 08/09/2016   Procedure: INTRAUTERINE DEVICE (IUD) INSERTION;  Surgeon: Everitt Amber, MD;  Location: [facility];  Service: Gynecology;  Laterality: N/A;  . LUNG BIOPSY    . uterine abletion        Current Facility-Administered Medications:  .  0.9 %  sodium chloride infusion, 250 mL, Intravenous, PRN, Jonelle Sidle, Mohammad L, MD .  acetaminophen (TYLENOL) tablet 650 mg, 650 mg, Oral, Q4H PRN, Garba, Mohammad L, MD .  albuterol (VENTOLIN HFA) 108 (90 Base) MCG/ACT inhaler 1-2 puff, 1-2 puff, Inhalation, Q6H PRN, Jonelle Sidle, Mohammad L, MD .  atorvastatin (LIPITOR) tablet 40 mg, 40 mg, Oral, q1800, Garba, Mohammad L, MD .  calcium-vitamin D (OSCAL WITH D) 500-200 MG-UNIT per tablet 1 tablet, 1 tablet, Oral, Q breakfast, Elwyn Reach, MD, 1 tablet at 08/11/20 0937 .  [START ON 08/12/2020] enoxaparin (LOVENOX) injection 100 mg, 100 mg, Subcutaneous, Q24H, Powell, A Clint Lipps., MD .  famotidine (PEPCID) tablet 40 mg, 40 mg, Oral, BID, Gala Romney L, MD, 40 mg at 08/11/20 0937 .  folic acid (FOLVITE) tablet 1 mg, 1 mg, Oral, Daily, Garba, Mohammad L, MD, 1 mg at 08/11/20 0940 .  furosemide (LASIX) injection 60 mg, 60 mg, Intravenous, BID, Gala Romney L, MD, 60 mg at 08/11/20 0942 .  HYDROcodone-acetaminophen (NORCO) 10-325 MG per tablet 1 tablet, 1 tablet, Oral, Q6H PRN, Jonelle Sidle, Mohammad L, MD .  loratadine (CLARITIN) tablet 10 mg, 10 mg, Oral, Daily, Jonelle Sidle, Mohammad L, MD, 10 mg at 08/11/20 0940 .  losartan (COZAAR) tablet 100 mg, 100 mg, Oral, Daily, Gala Romney L, MD, 100 mg at 08/11/20 0937 .  [START ON 08/14/2020] methotrexate chemo injection 20 mg, 20 mg, Intravenous, Weekly, Garba, Mohammad L, MD .  metoprolol succinate (TOPROL-XL) 24 hr tablet 25 mg, 25 mg, Oral, Daily, Garba, Mohammad L,  MD, 25 mg at 08/11/20 0941 .  ondansetron (ZOFRAN) injection 4 mg, 4 mg, Intravenous, Q6H PRN, Elwyn Reach, MD, 4 mg at 08/11/20 0942 .  predniSONE (DELTASONE) tablet 10 mg, 10 mg, Oral, Daily, Gala Romney L, MD, 10 mg at 08/11/20 0939 .  sodium chloride flush (NS) 0.9 % injection 3 mL, 3 mL, Intravenous, Q12H, Jonelle Sidle, Mohammad L, MD, 3 mL at 08/11/20 0948 .  sodium chloride flush (NS) 0.9 % injection 3 mL, 3 mL, Intravenous, PRN, Jonelle Sidle, Mohammad L, MD .  triamcinolone acetonide (KENALOG-40) injection 40 mg, 40 mg, Intramuscular, Q90 days, Garba, Mohammad L, MD .  triamcinolone ointment (KENALOG) 0.1 % 1 application, 1 application, Topical, BID, Garba, Mohammad L, MD .  vitamin B-12 (CYANOCOBALAMIN) tablet 1,000 mcg, 1,000 mcg, Oral, Daily, Elwyn Reach, MD, 1,000 mcg at 08/11/20 4431  Current Outpatient Medications:  .  albuterol (PROAIR HFA) 108 (90 Base) MCG/ACT inhaler, Inhale 1-2 puffs into the lungs every 6 (six) hours as needed for wheezing or shortness of breath., Disp: , Rfl:  .  atorvastatin (LIPITOR) 40 MG tablet, TAKE 1 TABLET (40 MG TOTAL) BY MOUTH DAILY AT 6 PM., Disp: 90 tablet, Rfl: 3 .  calcium citrate-vitamin D (CITRACAL+D) 315-200 MG-UNIT tablet, Take 1 tablet by mouth daily., Disp: , Rfl:  .  cetirizine (ZYRTEC) 10 MG tablet, Take 10 mg by mouth at bedtime as needed for allergies., Disp: , Rfl:  .   famotidine (PEPCID) 40 MG tablet, Take 1 tablet (40 mg total) by mouth 2 (two) times daily., Disp: 180 tablet, Rfl: 3 .  folic acid (FOLVITE) 1 MG tablet, Take 1 mg by mouth daily., Disp: , Rfl: 3 .  HYDROcodone-acetaminophen (NORCO) 10-325 MG tablet, Take 1 tablet by mouth every 6 (six) hours as needed (pain)., Disp: 30 tablet, Rfl: 0 .  losartan (COZAAR) 100 MG tablet, TAKE 1 TABLET (100 MG TOTAL) DAILY BY MOUTH., Disp: 90 tablet, Rfl: 3 .  methotrexate 50 MG/2ML injection, Inject 20 mg into the vein once a week. Dose is 0.8 mL (20mg ). Take on Fridays, Disp: , Rfl:  .  metoprolol succinate (TOPROL-XL) 25 MG 24 hr tablet, TAKE 1 TABLET BY MOUTH EVERY DAY (Patient taking differently: Take 25 mg by mouth daily. ), Disp: 90 tablet, Rfl: 3 .  predniSONE (DELTASONE) 10 MG tablet, Take 1 tablet (10 mg total) by mouth daily., Disp: 10 tablet, Rfl: 0 .  torsemide (DEMADEX) 20 MG tablet, Take 2 tablets (40 mg total) by mouth daily. Take additional 20 mg for weight gain of 3 lbs in 1 day or 5 lbs in 2 days or swelling or shortness of breath., Disp: 90 tablet, Rfl: 0 .  triamcinolone acetonide (KENALOG) 40 MG/ML injection, Inject 40 mg into the muscle every 3 (three) months. For knee pain, Disp: , Rfl:  .  triamcinolone ointment (KENALOG) 0.1 %, Apply 1 application topically 2 (two) times daily. For Sarcoidosis, Disp: , Rfl:  .  vitamin B-12 1000 MCG tablet, Take 1 tablet (1,000 mcg total) by mouth daily., Disp: 30 tablet, Rfl: 0 .  B-D TB SYRINGE 1CC/27GX1/2" 27G X 1/2" 1 ML MISC, USE ONCE A WEEK TO INJECT METHOTREXATE, Disp: , Rfl:  .  NON FORMULARY, 2 liter of oxygen, Disp: , Rfl:  . atorvastatin  40 mg Oral q1800  . calcium-vitamin D  1 tablet Oral Q breakfast  . [START ON 08/12/2020] enoxaparin (LOVENOX) injection  100 mg Subcutaneous Q24H  . famotidine  40 mg Oral  BID  . folic acid  1 mg Oral Daily  . furosemide  60 mg Intravenous BID  . loratadine  10 mg Oral Daily  . losartan  100 mg Oral Daily  .  [START ON 08/14/2020] methotrexate  20 mg Intravenous Weekly  . metoprolol succinate  25 mg Oral Daily  . predniSONE  10 mg Oral Daily  . sodium chloride flush  3 mL Intravenous Q12H  . triamcinolone acetonide  40 mg Intramuscular Q90 days  . triamcinolone ointment  1 application Topical BID  . cyanocobalamin  1,000 mcg Oral Daily   . sodium chloride      Physical Exam: Blood pressure 126/71, pulse 83, temperature 98 F (36.7 C), temperature source Oral, resp. rate 20, height 5\' 6"  (1.676 m), weight (!) 229.1 kg, last menstrual period 07/27/2020, SpO2 96 %.   Affect appropriate Morbidly obese black female  HEENT: normal Neck supple with no adenopathy JVP not visible  Lungs clear with no wheezing and good diaphragmatic motion Heart:  S1/S2 no murmur, no rub, gallop or click PMI normal Abdomen: benighn, BS positve, no tenderness, no AAA no bruit.  No HSM or HJR Distal pulses intact with no bruits Plus 2 chronic brawny edema Neuro non-focal Skin warm and dry No muscular weakness   Labs:   Lab Results  Component Value Date   WBC 7.8 08/11/2020   HGB 8.9 (L) 08/11/2020   HCT 33.7 (L) 08/11/2020   MCV 74.4 (L) 08/11/2020   PLT 191 08/11/2020    Recent Labs  Lab 08/10/20 1938 08/10/20 1938 08/11/20 0437  NA 143   < > 141  K 4.0   < > 3.9  CL 102   < > 99  CO2 34*   < > 31  BUN 16   < > 14  CREATININE 0.90   < > 0.91  CALCIUM 8.8*   < > 8.7*  PROT 7.2  --   --   BILITOT 0.6  --   --   ALKPHOS 42  --   --   ALT 14  --   --   AST 15  --   --   GLUCOSE 98   < > 98   < > = values in this interval not displayed.   Lab Results  Component Value Date   CKTOTAL 44 12/04/2013   CKMB 1.2 08/23/2011   TROPONINI <0.03 10/27/2018    Lab Results  Component Value Date   CHOL 241 (H) 05/08/2019   CHOL 261 (H) 03/02/2017   CHOL 204 (H) 06/12/2015   Lab Results  Component Value Date   HDL 58.80 05/08/2019   HDL 63.20 03/02/2017   HDL 50.60 06/12/2015   Lab Results   Component Value Date   LDLCALC 157 (H) 05/08/2019   LDLCALC 172 (H) 03/02/2017   LDLCALC 134 (H) 06/12/2015   Lab Results  Component Value Date   TRIG 125.0 05/08/2019   TRIG 130.0 03/02/2017   TRIG 93.0 06/12/2015   Lab Results  Component Value Date   CHOLHDL 4 05/08/2019   CHOLHDL 4 03/02/2017   CHOLHDL 4 06/12/2015   No results found for: LDLDIRECT    Radiology: DG Chest 2 View  Result Date: 08/10/2020 CLINICAL DATA:  54 year old female with increasing shortness of breath for 2 weeks. Fluid retention in the extremities. EXAM: CHEST - 2 VIEW COMPARISON:  Chest CTA 08/16/2019 and earlier. FINDINGS: AP semi upright and lateral views. Stable cardiomegaly and mediastinal contours. Visualized  tracheal air column is within normal limits. No pneumothorax or layering pleural effusion. Pulmonary interstitial opacity/indistinctness of pulmonary vasculature appears unchanged from last year. There is trace pleural fluid in the right minor fissure. No consolidation. No acute osseous abnormality identified. Paucity bowel gas in the upper abdomen. IMPRESSION: Cardiomegaly with indistinct pulmonary vasculature and trace fluid in the right minor fissure. Suspect acute interstitial edema. Electronically Signed   By: Genevie Ann M.D.   On: 08/10/2020 19:01   ECHOCARDIOGRAM COMPLETE  Result Date: 08/11/2020    ECHOCARDIOGRAM REPORT   Patient Name:   [name] Date of Exam: 08/11/2020 Medical Rec #:  [id number]             Height:       66.0 in Accession #:    [id number]            Weight:       505.0 lb Date of Birth:  1966/06/03             BSA:          2.964 m Patient Age:    86 years              BP:           139/71 mmHg Patient Gender: F                     HR:           81 bpm. Exam Location:  Inpatient Procedure: 2D Echo and Intracardiac Opacification Agent Indications:    CHF-Acute Diastolic 564.33 / I95.18  History:        Patient has prior history of Echocardiogram examinations, most                  recent 08/17/2019. CHF, COPD, Signs/Symptoms:Shortness of                 Breath; Risk Factors:Former Smoker, Sleep Apnea and                 Hypertension. Iron deficiency anemia, GERD, Sarcoidosis,                 Hypothyroidsm.  Sonographer:    Darlina Sicilian RDCS Referring Phys: [phone] [facility] Julious Oka  Sonographer Comments: Image acquisition challenging due to patient body habitus. IMPRESSIONS  1. Left ventricular ejection fraction, by estimation, is 55 to 60%. The left ventricle has normal function. The left ventricle has no regional wall motion abnormalities. There is mild concentric left ventricular hypertrophy. Left ventricular diastolic parameters are consistent with Grade II diastolic dysfunction (pseudonormalization). There is the interventricular septum is flattened in systole, consistent with right ventricular pressure overload.  2. Right ventricular systolic function is mildly reduced. The right ventricular size is mildly enlarged. There is moderately elevated pulmonary artery systolic pressure.  3. The mitral valve is normal in structure. No evidence of mitral valve regurgitation.  4. The aortic valve is normal in structure. Aortic valve regurgitation is not visualized. Comparison(s): A prior study was performed on 08/17/2019. Prior images reviewed side by side. The right ventricular hypertrophy is worse. There is also more prominent systolic flattening of the ventricular septum and worsened pulmonary artery hypertension and increased right atrial pressure. There is also prominent respiratory septal displacement; in the absence of other signs of constrictive physiology aor pericardial tamponade, this is likely due to markedly increased work of breathing (e.g. asthma/COPD exacerbation, severe obesity, etc.). FINDINGS  Left Ventricle: Left  ventricular ejection fraction, by estimation, is 55 to 60%. The left ventricle has normal function. The left ventricle has no regional wall motion  abnormalities. Definity contrast agent was given IV to delineate the left ventricular  endocardial borders. The left ventricular internal cavity size was normal in size. There is mild concentric left ventricular hypertrophy. The interventricular septum is flattened in systole, consistent with right ventricular pressure overload. Left ventricular diastolic parameters are consistent with Grade II diastolic dysfunction (pseudonormalization). Indeterminate filling pressures. Right Ventricle: The right ventricular size is mildly enlarged. No increase in right ventricular wall thickness. Right ventricular systolic function is mildly reduced. There is moderately elevated pulmonary artery systolic pressure. The tricuspid regurgitant velocity is 3.14 m/s, and with an assumed right atrial pressure of 15 mmHg, the estimated right ventricular systolic pressure is 27.5 mmHg. Left Atrium: Left atrial size was normal in size. Right Atrium: Right atrial size was not well visualized. Pericardium: There is no evidence of pericardial effusion. Mitral Valve: The mitral valve is normal in structure. No evidence of mitral valve regurgitation. Tricuspid Valve: The tricuspid valve is grossly normal. Tricuspid valve regurgitation is trivial. Aortic Valve: The aortic valve is normal in structure. Aortic valve regurgitation is not visualized. Pulmonic Valve: The pulmonic valve was not well visualized. Pulmonic valve regurgitation is not visualized. Aorta: The aortic root and ascending aorta are structurally normal, with no evidence of dilitation. IAS/Shunts: The interatrial septum was not well visualized.  LEFT VENTRICLE PLAX 2D LVIDd:         5.00 cm  Diastology LVIDs:         3.15 cm  LV e' medial:    6.74 cm/s LV PW:         1.15 cm  LV E/e' medial:  10.9 LV IVS:        1.30 cm  LV e' lateral:   5.00 cm/s LVOT diam:     2.00 cm  LV E/e' lateral: 14.7 LV SV:         47 LV SV Index:   16 LVOT Area:     3.14 cm  LEFT ATRIUM           Index  LA diam:      3.65 cm 1.23 cm/m LA Vol (A4C): 39.7 ml 13.39 ml/m  AORTIC VALVE LVOT Vmax:   77.00 cm/s LVOT Vmean:  53.500 cm/s LVOT VTI:    0.149 m  AORTA Ao Root diam: 2.70 cm MITRAL VALVE               TRICUSPID VALVE MV Area (PHT): 5.27 cm    TR Peak grad:   39.4 mmHg MV Decel Time: 144 msec    TR Vmax:        314.00 cm/s MV E velocity: 73.70 cm/s MV A velocity: 46.70 cm/s  SHUNTS MV E/A ratio:  1.58        Systemic VTI:  0.15 m                            Systemic Diam: 2.00 cm Dani Gobble Croitoru MD Electronically signed by Sanda Klein MD Signature Date/Time: 08/11/2020/8:45:05 AM    Final     EKG: NSR rate 83 normal    ASSESSMENT AND PLAN:   1. Diastolic CHF:  Acute on chronic. Major issues are morbid obesity, poor diet and OSA. Elevated PA pressures are WHO class 2/3 and don't think she would benefit from right heart  cath as directed vasodilators only indicated for WHO class 1.  Primary goal of Rx is diuresis. 60 iv lasix bid is fine. Continue CPAP she needs to f/u with Dr Elsworth Soho pulmonary to optimize. Weight loss does not seem to be on her agenda and she is extremely sedentary  Suspect she will need to go home on Demadex 40 bid. Would diurese until azotemic supplement K.  Note her BNP was only 34 and a lot of her weight gain is simple obesity  Not sure cardiology needs to be directly involved with her diuresis Call with questions   Signed: Jenkins Rouge 08/11/2020, 2:53 PM

## 2020-08-11 NOTE — ED Notes (Signed)
Pt ambulated with assistance (wheelchair) to restroom and back.

## 2020-08-11 NOTE — ED Notes (Signed)
Patient to the bathroom via w/c.

## 2020-08-11 NOTE — ED Notes (Signed)
Patient is resting comfortably. 

## 2020-08-11 NOTE — Progress Notes (Signed)
  Echocardiogram 2D Echocardiogram with definity has been performed.  [name] M 08/11/2020, 8:15 AM

## 2020-08-12 ENCOUNTER — Inpatient Hospital Stay (HOSPITAL_COMMUNITY): Payer: Medicare Other

## 2020-08-12 ENCOUNTER — Telehealth: Payer: Self-pay

## 2020-08-12 DIAGNOSIS — I5033 Acute on chronic diastolic (congestive) heart failure: Secondary | ICD-10-CM | POA: Diagnosis not present

## 2020-08-12 LAB — COMPREHENSIVE METABOLIC PANEL
ALT: 12 U/L (ref 0–44)
AST: 14 U/L — ABNORMAL LOW (ref 15–41)
Albumin: 3.5 g/dL (ref 3.5–5.0)
Alkaline Phosphatase: 41 U/L (ref 38–126)
Anion gap: 9 (ref 5–15)
BUN: 18 mg/dL (ref 6–20)
CO2: 34 mmol/L — ABNORMAL HIGH (ref 22–32)
Calcium: 8.5 mg/dL — ABNORMAL LOW (ref 8.9–10.3)
Chloride: 96 mmol/L — ABNORMAL LOW (ref 98–111)
Creatinine, Ser: 1.14 mg/dL — ABNORMAL HIGH (ref 0.44–1.00)
GFR, Estimated: 54 mL/min — ABNORMAL LOW (ref >60)
Glucose, Bld: 117 mg/dL — ABNORMAL HIGH (ref 70–99)
Potassium: 3.5 mmol/L (ref 3.5–5.1)
Sodium: 139 mmol/L (ref 135–145)
Total Bilirubin: 0.8 mg/dL (ref 0.3–1.2)
Total Protein: 6.8 g/dL (ref 6.5–8.1)

## 2020-08-12 LAB — CBC WITH DIFFERENTIAL/PLATELET
Abs Immature Granulocytes: 0.04 10*3/uL (ref 0.00–0.07)
Basophils Absolute: 0 10*3/uL (ref 0.0–0.1)
Basophils Relative: 0 %
Eosinophils Absolute: 0.2 10*3/uL (ref 0.0–0.5)
Eosinophils Relative: 2 %
HCT: 34 % — ABNORMAL LOW (ref 36.0–46.0)
Hemoglobin: 9 g/dL — ABNORMAL LOW (ref 12.0–15.0)
Immature Granulocytes: 1 %
Lymphocytes Relative: 15 %
Lymphs Abs: 1.3 10*3/uL (ref 0.7–4.0)
MCH: 19.7 pg — ABNORMAL LOW (ref 26.0–34.0)
MCHC: 26.5 g/dL — ABNORMAL LOW (ref 30.0–36.0)
MCV: 74.6 fL — ABNORMAL LOW (ref 80.0–100.0)
Monocytes Absolute: 1.2 10*3/uL — ABNORMAL HIGH (ref 0.1–1.0)
Monocytes Relative: 13 %
Neutro Abs: 5.9 10*3/uL (ref 1.7–7.7)
Neutrophils Relative %: 69 %
Platelets: 194 10*3/uL (ref 150–400)
RBC: 4.56 MIL/uL (ref 3.87–5.11)
RDW: 24.3 % — ABNORMAL HIGH (ref 11.5–15.5)
WBC: 8.6 10*3/uL (ref 4.0–10.5)
nRBC: 0 % (ref 0.0–0.2)

## 2020-08-12 LAB — MAGNESIUM: Magnesium: 1.7 mg/dL (ref 1.7–2.4)

## 2020-08-12 LAB — PHOSPHORUS: Phosphorus: 4.7 mg/dL — ABNORMAL HIGH (ref 2.5–4.6)

## 2020-08-12 NOTE — Progress Notes (Signed)
Progress Note  Patient Name: [name] Date of Encounter: 08/12/2020  Primary Cardiologist: Dr. [name], MD   Subjective   Doing well some issues with iv access   Inpatient Medications    Scheduled Meds: . atorvastatin  40 mg Oral q1800  . calcium-vitamin D  1 tablet Oral Q breakfast  . famotidine  40 mg Oral BID  . folic acid  1 mg Oral Daily  . furosemide  60 mg Intravenous BID  . influenza vac split quadrivalent PF  0.5 mL Intramuscular Tomorrow-1000  . loratadine  10 mg Oral Daily  . losartan  100 mg Oral Daily  . mouth rinse  15 mL Mouth Rinse BID  . metoprolol succinate  25 mg Oral Daily  . predniSONE  10 mg Oral Daily  . sodium chloride flush  3 mL Intravenous Q12H  . triamcinolone ointment  1 application Topical BID  . cyanocobalamin  1,000 mcg Oral Daily   Continuous Infusions: . sodium chloride     PRN Meds: sodium chloride, acetaminophen, albuterol, HYDROcodone-acetaminophen, ondansetron (ZOFRAN) IV, sodium chloride flush   Vital Signs    Vitals:   08/11/20 2018 08/12/20 0149 08/12/20 0456 08/12/20 0637  BP: (!) 178/71 (!) 132/48 127/61   Pulse: 91 70 79   Resp: (!) 22 (!) 22 16   Temp: 98.2 F (36.8 C) 98.4 F (36.9 C) 97.9 F (36.6 C)   TempSrc: Oral Oral Oral   SpO2: 94% 95% 95%   Weight:    (!) 227.3 kg  Height:        Intake/Output Summary (Last 24 hours) at 08/12/2020 0814 Last data filed at 08/12/2020 0357 Gross per 24 hour  Intake 543 ml  Output 1700 ml  Net -1157 ml   Filed Weights   08/10/20 1802 08/11/20 1716 08/12/20 0637  Weight: (!) 229.1 kg (!) 223 kg (!) 227.3 kg    Physical Exam   Affect appropriate Morbidly obese black female  HEENT: normal Neck supple with no adenopathy JVP normal no bruits no thyromegaly Lungs clear with no wheezing and good diaphragmatic motion Heart:  S1/S2 no murmur, no rub, gallop or click PMI normal Abdomen: benighn, BS positve, no tenderness, no AAA no bruit.  No HSM or  HJR Distal pulses intact with no bruits Plus 2 chronic brawny edema Neuro non-focal Skin warm and dry No muscular weakness   Labs    Chemistry Recent Labs  Lab 08/10/20 1938 08/11/20 0437 08/12/20 0614  NA 143 141 139  K 4.0 3.9 3.5  CL 102 99 96*  CO2 34* 31 34*  GLUCOSE 98 98 117*  BUN 16 14 18   CREATININE 0.90 0.91 1.14*  CALCIUM 8.8* 8.7* 8.5*  PROT 7.2  --  6.8  ALBUMIN 3.6  --  3.5  AST 15  --  14*  ALT 14  --  12  ALKPHOS 42  --  41  BILITOT 0.6  --  0.8  GFRNONAA >60 >60 54*  ANIONGAP 7 11 9      Hematology Recent Labs  Lab 08/10/20 1938 08/11/20 0437 08/12/20 0614  WBC 9.0 7.8 8.6  RBC 4.66 4.53 4.56  HGB 9.2* 8.9* 9.0*  HCT 34.3* 33.7* 34.0*  MCV 73.6* 74.4* 74.6*  MCH 19.7* 19.6* 19.7*  MCHC 26.8* 26.4* 26.5*  RDW 24.7* 24.7* 24.3*  PLT 191 191 194    Cardiac EnzymesNo results for input(s): TROPONINI in the last 168 hours. No results for input(s): TROPIPOC in  the last 168 hours.   BNP Recent Labs  Lab 08/10/20 1938  BNP 34.7     DDimer No results for input(s): DDIMER in the last 168 hours.   Radiology    DG Chest 2 View  Result Date: 08/10/2020 CLINICAL DATA:  54 year old female with increasing shortness of breath for 2 weeks. Fluid retention in the extremities. EXAM: CHEST - 2 VIEW COMPARISON:  Chest CTA 08/16/2019 and earlier. FINDINGS: AP semi upright and lateral views. Stable cardiomegaly and mediastinal contours. Visualized tracheal air column is within normal limits. No pneumothorax or layering pleural effusion. Pulmonary interstitial opacity/indistinctness of pulmonary vasculature appears unchanged from last year. There is trace pleural fluid in the right minor fissure. No consolidation. No acute osseous abnormality identified. Paucity bowel gas in the upper abdomen. IMPRESSION: Cardiomegaly with indistinct pulmonary vasculature and trace fluid in the right minor fissure. Suspect acute interstitial edema. Electronically Signed   By:  Genevie Ann M.D.   On: 08/10/2020 19:01   ECHOCARDIOGRAM COMPLETE  Result Date: 08/11/2020    ECHOCARDIOGRAM REPORT   Patient Name:   BLONDIE RIGGSBEE Date of Exam: 08/11/2020 Medical Rec #:  [id number]             Height:       66.0 in Accession #:    [id number]            Weight:       505.0 lb Date of Birth:  07/04/1966             BSA:          2.964 m Patient Age:    15 years              BP:           139/71 mmHg Patient Gender: F                     HR:           81 bpm. Exam Location:  Inpatient Procedure: 2D Echo and Intracardiac Opacification Agent Indications:    CHF-Acute Diastolic 532.99 / M42.68  History:        Patient has prior history of Echocardiogram examinations, most                 recent 08/17/2019. CHF, COPD, Signs/Symptoms:Shortness of                 Breath; Risk Factors:Former Smoker, Sleep Apnea and                 Hypertension. Iron deficiency anemia, GERD, Sarcoidosis,                 Hypothyroidsm.  Sonographer:    Darlina Sicilian RDCS Referring Phys: [phone] [facility] Julious Oka  Sonographer Comments: Image acquisition challenging due to patient body habitus. IMPRESSIONS  1. Left ventricular ejection fraction, by estimation, is 55 to 60%. The left ventricle has normal function. The left ventricle has no regional wall motion abnormalities. There is mild concentric left ventricular hypertrophy. Left ventricular diastolic parameters are consistent with Grade II diastolic dysfunction (pseudonormalization). There is the interventricular septum is flattened in systole, consistent with right ventricular pressure overload.  2. Right ventricular systolic function is mildly reduced. The right ventricular size is mildly enlarged. There is moderately elevated pulmonary artery systolic pressure.  3. The mitral valve is normal in structure. No evidence of mitral valve regurgitation.  4. The aortic valve is normal in  structure. Aortic valve regurgitation is not visualized. Comparison(s): A prior study  was performed on 08/17/2019. Prior images reviewed side by side. The right ventricular hypertrophy is worse. There is also more prominent systolic flattening of the ventricular septum and worsened pulmonary artery hypertension and increased right atrial pressure. There is also prominent respiratory septal displacement; in the absence of other signs of constrictive physiology aor pericardial tamponade, this is likely due to markedly increased work of breathing (e.g. asthma/COPD exacerbation, severe obesity, etc.). FINDINGS  Left Ventricle: Left ventricular ejection fraction, by estimation, is 55 to 60%. The left ventricle has normal function. The left ventricle has no regional wall motion abnormalities. Definity contrast agent was given IV to delineate the left ventricular  endocardial borders. The left ventricular internal cavity size was normal in size. There is mild concentric left ventricular hypertrophy. The interventricular septum is flattened in systole, consistent with right ventricular pressure overload. Left ventricular diastolic parameters are consistent with Grade II diastolic dysfunction (pseudonormalization). Indeterminate filling pressures. Right Ventricle: The right ventricular size is mildly enlarged. No increase in right ventricular wall thickness. Right ventricular systolic function is mildly reduced. There is moderately elevated pulmonary artery systolic pressure. The tricuspid regurgitant velocity is 3.14 m/s, and with an assumed right atrial pressure of 15 mmHg, the estimated right ventricular systolic pressure is 95.6 mmHg. Left Atrium: Left atrial size was normal in size. Right Atrium: Right atrial size was not well visualized. Pericardium: There is no evidence of pericardial effusion. Mitral Valve: The mitral valve is normal in structure. No evidence of mitral valve regurgitation. Tricuspid Valve: The tricuspid valve is grossly normal. Tricuspid valve regurgitation is trivial. Aortic Valve:  The aortic valve is normal in structure. Aortic valve regurgitation is not visualized. Pulmonic Valve: The pulmonic valve was not well visualized. Pulmonic valve regurgitation is not visualized. Aorta: The aortic root and ascending aorta are structurally normal, with no evidence of dilitation. IAS/Shunts: The interatrial septum was not well visualized.  LEFT VENTRICLE PLAX 2D LVIDd:         5.00 cm  Diastology LVIDs:         3.15 cm  LV e' medial:    6.74 cm/s LV PW:         1.15 cm  LV E/e' medial:  10.9 LV IVS:        1.30 cm  LV e' lateral:   5.00 cm/s LVOT diam:     2.00 cm  LV E/e' lateral: 14.7 LV SV:         47 LV SV Index:   16 LVOT Area:     3.14 cm  LEFT ATRIUM           Index LA diam:      3.65 cm 1.23 cm/m LA Vol (A4C): 39.7 ml 13.39 ml/m  AORTIC VALVE LVOT Vmax:   77.00 cm/s LVOT Vmean:  53.500 cm/s LVOT VTI:    0.149 m  AORTA Ao Root diam: 2.70 cm MITRAL VALVE               TRICUSPID VALVE MV Area (PHT): 5.27 cm    TR Peak grad:   39.4 mmHg MV Decel Time: 144 msec    TR Vmax:        314.00 cm/s MV E velocity: 73.70 cm/s MV A velocity: 46.70 cm/s  SHUNTS MV E/A ratio:  1.58        Systemic VTI:  0.15 m  Systemic Diam: 2.00 cm Sanda Klein MD Electronically signed by Sanda Klein MD Signature Date/Time: 08/11/2020/8:45:05 AM    Final     Telemetry    NSR 08/12/2020   ECG    NSR rate 83 normal 08/10/20    Cardiac Studies   Echo 08/11/20 EF 24-40%  Grade 2 diastolic dysfunction Estimated PA 55 mmHg   Patient Profile     54 y.o. female OSA, HTN, hypothyroidism and sarcoid. She carries a diagnosis of diastolic heart dysfunction with history of fluid overload.  Assessment & Plan    1. Acute on chronic diastolic HF: Over liter diuresis. Doing well Diastolic dysfunction related to obesity, HTN and OSA Supplement K 3.5 Cr stable 1.14 today   2. Pulmonary HTN:  Related to diastolic dysfunction / Obesity hypoventilation Rx is diuretics weight loss and BP  control No role for targeted vasodilator Rx in WHO Class 2/3   Jenkins Rouge MD [facility]   For questions or updates, please contact   Please consult [email or web address] for contact info under Cardiology/STEMI.

## 2020-08-12 NOTE — Progress Notes (Addendum)
Per dayshift RN, patient refused bari bed d/t bari bed being too hard to get in and out of to ambulate to BR. She did agree to [employer] chair.

## 2020-08-12 NOTE — Telephone Encounter (Signed)
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  She is requesting that Dr. Burr Medico order her lab tests while she is in the hospital for her upcoming appt on 09/03/2020. Forwarded to Dr Burr Medico

## 2020-08-12 NOTE — Progress Notes (Signed)
PROGRESS NOTE    [name]  ZDG:[id number] DOB: October 07, 1966 DOA: 08/10/2020 PCP: [name], NP  Chief Complaint  Patient presents with  . Shortness of Breath   Brief Narrative:  EDLA PARA is [name] 54 y.o. female with medical history significant of diastolic CHF, iron deficiency anemia, COPD, morbidly obese, GERD, sarcoidosis, obstructive sleep apnea, hypothyroidism and hypertension who presented to the ER with worsening shortness of breath and increased weight in the last month.  She has gained about 15 pounds.  Patient normally is on 2 L of oxygen at home and uses CPAP at night.  She was seen in the ER with slight increase in oxygen requirement.  She has history of COPD but no wheezing.  She is currently requiring 3 L of oxygen.  She is also notably having significant edema.  Patient has been taking oral Bumex at home but has not been having significant response.  She is being admitted for IV diuretics and reevaluation.  She had her last echocardiogram [name] year ago where the EF was 55 to 60%.  She denied any chest pain.  Denied any recent cardiac work-up.  She has sarcoidosis and is on chronic prednisone..  ED Course: Temperature 98 blood pressure 170/75 pulse 111 respirate of 38 oxygen sats 93% on 3 L.  White count is 9.0 hemoglobin 9.1 platelets 191.  Sodium 143 potassium 4.0 chloride 102 CO2 34 BUN 16 creatinine 0.90 calcium 8.8.  Respiratory panel is negative.  Fecal occult blood testing negative x2.  PT/INR 14 and 1.06.  Chest x-ray showed cardiomegaly with pulmonary vascular and trace fluid in the right effusion acute interstitial edema.  Patient being admitted for evaluation and treatment  Assessment & Plan:   Principal Problem:   Acute on chronic diastolic (congestive) heart failure (HCC) Active Problems:   OSA (obstructive sleep apnea)   Obesity hypoventilation syndrome (HCC)   Hypertension   Sarcoidosis   Iron deficiency anemia due to chronic blood loss    Hypoxemia   GERD (gastroesophageal reflux disease)  #1 HFpEF Exacerbation  Right Sided Heart Failure  Pulmonary Hypertension:  EF 55-60%, grade II diastolic dysfunction, RV pressure overload, mildly reduced RV systolic function, moderately elevated PASP (see report) Continue lasix 60 mg IV BID (she's on 40 mg bumex at home daily) Cardiology c/s, recommending continuing diuresis -> elevated PA pressures WHO 2/3, no recommendation for [facility] cath, continue cpap  Strict I/O, daily weights  Wt Readings from Last 3 Encounters:  08/12/20 (!) 227.3 kg  01/24/20 (!) 230.4 kg  12/05/19 (!) 230.1 kg   #2 obstructive sleep apnea: Patient on CPAP at home.  Initiate CPAP and monitor.  #3 obesity hypoventilation syndrome: Continue oxygen and supportive care.  #4 GERD: Continue PPIs.  #5 hypertension: losartan, metoprolol, diuretics  #6 sarcoidosis: On chronic prednisone.  Continue  #7 iron deficiency anemia: Follow iron, b12, folate, ferritin.  Hb relatively stable.  DVT prophylaxis: lovenox Code Status: full  Family Communication: none at bedside Disposition:   Status is: Inpatient  Remains inpatient appropriate because:Inpatient level of care appropriate due to severity of illness   Dispo: The patient is from: Home              Anticipated d/c is to: Home              Anticipated d/c date is: > 3 days              Patient currently is not medically stable to d/c.  Consultants:   cardiology  Procedures: Echo IMPRESSIONS    1. Left ventricular ejection fraction, by estimation, is 55 to 60%. The  left ventricle has normal function. The left ventricle has no regional  wall motion abnormalities. There is mild concentric left ventricular  hypertrophy. Left ventricular diastolic  parameters are consistent with Grade II diastolic dysfunction  (pseudonormalization). There is the interventricular septum is flattened  in systole, consistent with right ventricular  pressure overload.  2. Right ventricular systolic function is mildly reduced. The right  ventricular size is mildly enlarged. There is moderately elevated  pulmonary artery systolic pressure.  3. The mitral valve is normal in structure. No evidence of mitral valve  regurgitation.  4. The aortic valve is normal in structure. Aortic valve regurgitation is  not visualized.   Antimicrobials: Anti-infectives (From admission, onward)   None     Subjective: Concerned that she's making less urine today  Objective: Vitals:   08/12/20 0149 08/12/20 0456 08/12/20 0637 08/12/20 1452  BP: (!) 132/48 127/61  (!) 153/65  Pulse: 70 79  80  Resp: (!) 22 16  16   Temp: 98.4 F (36.9 C) 97.9 F (36.6 C)  98.2 F (36.8 C)  TempSrc: Oral Oral  Oral  SpO2: 95% 95%  93%  Weight:   (!) 227.3 kg   Height:        Intake/Output Summary (Last 24 hours) at 08/12/2020 1619 Last data filed at 08/12/2020 1618 Gross per 24 hour  Intake 1500 ml  Output 2900 ml  Net -1400 ml   Filed Weights   08/10/20 1802 08/11/20 1716 08/12/20 0637  Weight: (!) 229.1 kg (!) 223 kg (!) 227.3 kg    Examination:  General: No acute distress. Cardiovascular: Heart sounds show Javan Gonzaga regular rate, and rhythm.  Lungs: distant, unlabored Abdomen: Soft, nontender, nondistended  Neurological: Alert and oriented 3. Moves all extremities 4. Cranial nerves II through XII grossly intact. Skin: Warm and dry. No rashes or lesions. Extremities: bilateral LE edema   Data Reviewed: I have personally reviewed following labs and imaging studies  CBC: Recent Labs  Lab 08/10/20 1938 08/11/20 0437 08/12/20 0614  WBC 9.0 7.8 8.6  NEUTROABS 6.8  --  5.9  HGB 9.2* 8.9* 9.0*  HCT 34.3* 33.7* 34.0*  MCV 73.6* 74.4* 74.6*  PLT 191 191 546    Basic Metabolic Panel: Recent Labs  Lab 08/10/20 1938 08/11/20 0437 08/12/20 0614  NA 143 141 139  K 4.0 3.9 3.5  CL 102 99 96*  CO2 34* 31 34*  GLUCOSE 98 98 117*  BUN 16 14  18   CREATININE 0.90 0.91 1.14*  CALCIUM 8.8* 8.7* 8.5*  MG  --   --  1.7  PHOS  --   --  4.7*    GFR: Estimated Creatinine Clearance: 112.7 mL/min (Kaycie Pegues) (by C-G formula based on SCr of 1.14 mg/dL (H)).  Liver Function Tests: Recent Labs  Lab 08/10/20 1938 08/12/20 0614  AST 15 14*  ALT 14 12  ALKPHOS 42 41  BILITOT 0.6 0.8  PROT 7.2 6.8  ALBUMIN 3.6 3.5    CBG: No results for input(s): GLUCAP in the last 168 hours.   Recent Results (from the past 240 hour(s))  Respiratory Panel by RT PCR (Flu Dallie Patton&B, Covid) - Nasopharyngeal Swab     Status: None   Collection Time: 08/10/20  7:45 PM   Specimen: Nasopharyngeal Swab  Result Value Ref Range Status   SARS Coronavirus 2 by RT PCR  NEGATIVE NEGATIVE Final    Comment: (NOTE) SARS-CoV-2 target nucleic acids are NOT DETECTED.  The SARS-CoV-2 RNA is generally detectable in upper respiratoy specimens during the acute phase of infection. The lowest concentration of SARS-CoV-2 viral copies this assay can detect is 131 copies/mL. Shawanna Zanders negative result does not preclude SARS-Cov-2 infection and should not be used as the sole basis for treatment or other patient management decisions. Kaliyah Gladman negative result may occur with  improper specimen collection/handling, submission of specimen other than nasopharyngeal swab, presence of viral mutation(s) within the areas targeted by this assay, and inadequate number of viral copies (<131 copies/mL). Macario Shear negative result must be combined with clinical observations, patient history, and epidemiological information. The expected result is Negative.  Fact Sheet for Patients:  PinkCheek.be  Fact Sheet for Healthcare Providers:  GravelBags.it  This test is no t yet approved or cleared by the [place] FDA and  has been authorized for detection and/or diagnosis of SARS-CoV-2 by FDA under an Emergency Use Authorization (EUA). This EUA will remain  in  effect (meaning this test can be used) for the duration of the COVID-19 declaration under Section 564(b)(1) of the Act, 21 U.S.C. section 360bbb-3(b)(1), unless the authorization is terminated or revoked sooner.     Influenza Fred Franzen by PCR NEGATIVE NEGATIVE Final   Influenza B by PCR NEGATIVE NEGATIVE Final    Comment: (NOTE) The Xpert Xpress SARS-CoV-2/FLU/RSV assay is intended as an aid in  the diagnosis of influenza from Nasopharyngeal swab specimens and  should not be used as Quantrell Splitt sole basis for treatment. Nasal washings and  aspirates are unacceptable for Xpert Xpress SARS-CoV-2/FLU/RSV  testing.  Fact Sheet for Patients: PinkCheek.be  Fact Sheet for Healthcare Providers: GravelBags.it  This test is not yet approved or cleared by the [place] FDA and  has been authorized for detection and/or diagnosis of SARS-CoV-2 by  FDA under an Emergency Use Authorization (EUA). This EUA will remain  in effect (meaning this test can be used) for the duration of the  Covid-19 declaration under Section 564(b)(1) of the Act, 21  U.S.C. section 360bbb-3(b)(1), unless the authorization is  terminated or revoked. Performed at [facility], [place] [address] [place],  [address]          Radiology Studies: DG Chest 2 View  Result Date: 08/10/2020 CLINICAL DATA:  54 year old female with increasing shortness of breath for 2 weeks. Fluid retention in the extremities. EXAM: CHEST - 2 VIEW COMPARISON:  Chest CTA 08/16/2019 and earlier. FINDINGS: AP semi upright and lateral views. Stable cardiomegaly and mediastinal contours. Visualized tracheal air column is within normal limits. No pneumothorax or layering pleural effusion. Pulmonary interstitial opacity/indistinctness of pulmonary vasculature appears unchanged from last year. There is trace pleural fluid in the right minor fissure. No consolidation. No acute osseous  abnormality identified. Paucity bowel gas in the upper abdomen. IMPRESSION: Cardiomegaly with indistinct pulmonary vasculature and trace fluid in the right minor fissure. Suspect acute interstitial edema. Electronically Signed   By: Genevie Ann M.D.   On: 08/10/2020 19:01   DG CHEST PORT 1 VIEW  Result Date: 08/12/2020 CLINICAL DATA:  Short of breath EXAM: PORTABLE CHEST 1 VIEW COMPARISON:  08/10/2020 FINDINGS: Cardiac enlargement with vascular congestion similar to prior study. No significant edema or effusion. Mild left lower lobe atelectasis. IMPRESSION: Cardiac enlargement with mild vascular congestion similar to the prior study. Mild fluid overload. Electronically Signed   By: Franchot Gallo M.D.   On: 08/12/2020 10:33  ECHOCARDIOGRAM COMPLETE  Result Date: 08/11/2020    ECHOCARDIOGRAM REPORT   Patient Name:   QUINESHA SELINGER Date of Exam: 08/11/2020 Medical Rec #:  [id number]             Height:       66.0 in Accession #:    [id number]            Weight:       505.0 lb Date of Birth:  October 04, 1966             BSA:          2.964 m Patient Age:    87 years              BP:           139/71 mmHg Patient Gender: F                     HR:           81 bpm. Exam Location:  Inpatient Procedure: 2D Echo and Intracardiac Opacification Agent Indications:    CHF-Acute Diastolic 829.56 / O13.08  History:        Patient has prior history of Echocardiogram examinations, most                 recent 08/17/2019. CHF, COPD, Signs/Symptoms:Shortness of                 Breath; Risk Factors:Former Smoker, Sleep Apnea and                 Hypertension. Iron deficiency anemia, GERD, Sarcoidosis,                 Hypothyroidsm.  Sonographer:    Darlina Sicilian RDCS Referring Phys: [phone] [facility] Julious Oka  Sonographer Comments: Image acquisition challenging due to patient body habitus. IMPRESSIONS  1. Left ventricular ejection fraction, by estimation, is 55 to 60%. The left ventricle has normal function. The left ventricle has  no regional wall motion abnormalities. There is mild concentric left ventricular hypertrophy. Left ventricular diastolic parameters are consistent with Grade II diastolic dysfunction (pseudonormalization). There is the interventricular septum is flattened in systole, consistent with right ventricular pressure overload.  2. Right ventricular systolic function is mildly reduced. The right ventricular size is mildly enlarged. There is moderately elevated pulmonary artery systolic pressure.  3. The mitral valve is normal in structure. No evidence of mitral valve regurgitation.  4. The aortic valve is normal in structure. Aortic valve regurgitation is not visualized. Comparison(s): [place] Brick prior study was performed on 08/17/2019. Prior images reviewed side by side. The right ventricular hypertrophy is worse. There is also more prominent systolic flattening of the ventricular septum and worsened pulmonary artery hypertension and increased right atrial pressure. There is also prominent respiratory septal displacement; in the absence of other signs of constrictive physiology aor pericardial tamponade, this is likely due to markedly increased work of breathing (e.g. asthma/COPD exacerbation, severe obesity, etc.). FINDINGS  Left Ventricle: Left ventricular ejection fraction, by estimation, is 55 to 60%. The left ventricle has normal function. The left ventricle has no regional wall motion abnormalities. Definity contrast agent was given IV to delineate the left ventricular  endocardial borders. The left ventricular internal cavity size was normal in size. There is mild concentric left ventricular hypertrophy. The interventricular septum is flattened in systole, consistent with right ventricular pressure overload. Left ventricular diastolic parameters are consistent with Grade II diastolic dysfunction (pseudonormalization). Indeterminate filling  pressures. Right Ventricle: The right ventricular size is mildly enlarged. No increase  in right ventricular wall thickness. Right ventricular systolic function is mildly reduced. There is moderately elevated pulmonary artery systolic pressure. The tricuspid regurgitant velocity is 3.14 m/s, and with an assumed right atrial pressure of 15 mmHg, the estimated right ventricular systolic pressure is 32.9 mmHg. Left Atrium: Left atrial size was normal in size. Right Atrium: Right atrial size was not well visualized. Pericardium: There is no evidence of pericardial effusion. Mitral Valve: The mitral valve is normal in structure. No evidence of mitral valve regurgitation. Tricuspid Valve: The tricuspid valve is grossly normal. Tricuspid valve regurgitation is trivial. Aortic Valve: The aortic valve is normal in structure. Aortic valve regurgitation is not visualized. Pulmonic Valve: The pulmonic valve was not well visualized. Pulmonic valve regurgitation is not visualized. Aorta: The aortic root and ascending aorta are structurally normal, with no evidence of dilitation. IAS/Shunts: The interatrial septum was not well visualized.  LEFT VENTRICLE PLAX 2D LVIDd:         5.00 cm  Diastology LVIDs:         3.15 cm  LV e' medial:    6.74 cm/s LV PW:         1.15 cm  LV E/e' medial:  10.9 LV IVS:        1.30 cm  LV e' lateral:   5.00 cm/s LVOT diam:     2.00 cm  LV E/e' lateral: 14.7 LV SV:         47 LV SV Index:   16 LVOT Area:     3.14 cm  LEFT ATRIUM           Index LA diam:      3.65 cm 1.23 cm/m LA Vol (A4C): 39.7 ml 13.39 ml/m  AORTIC VALVE LVOT Vmax:   77.00 cm/s LVOT Vmean:  53.500 cm/s LVOT VTI:    0.149 m  AORTA Ao Root diam: 2.70 cm MITRAL VALVE               TRICUSPID VALVE MV Area (PHT): 5.27 cm    TR Peak grad:   39.4 mmHg MV Decel Time: 144 msec    TR Vmax:        314.00 cm/s MV E velocity: 73.70 cm/s MV Reyana Leisey velocity: 46.70 cm/s  SHUNTS MV E/Doren Kaspar ratio:  1.58        Systemic VTI:  0.15 m                            Systemic Diam: 2.00 cm Dani Gobble Croitoru MD Electronically signed by Sanda Klein MD  Signature Date/Time: 08/11/2020/8:45:05 AM    Final         Scheduled Meds: . atorvastatin  40 mg Oral q1800  . calcium-vitamin D  1 tablet Oral Q breakfast  . famotidine  40 mg Oral BID  . folic acid  1 mg Oral Daily  . furosemide  60 mg Intravenous BID  . influenza vac split quadrivalent PF  0.5 mL Intramuscular Tomorrow-1000  . loratadine  10 mg Oral Daily  . losartan  100 mg Oral Daily  . mouth rinse  15 mL Mouth Rinse BID  . metoprolol succinate  25 mg Oral Daily  . predniSONE  10 mg Oral Daily  . sodium chloride flush  3 mL Intravenous Q12H  . triamcinolone ointment  1 application Topical BID  . cyanocobalamin  1,000  mcg Oral Daily   Continuous Infusions: . sodium chloride       LOS: 2 days    Time spent: over 30 min    Fayrene Helper, MD [facility]   To contact the attending provider between 7A-7P or the covering provider during after hours 7P-7A, please log into the web site [email or web address] and access using universal [place] password for that web site. If you do not have the password, please call the hospital operator.  08/12/2020, 4:19 PM

## 2020-08-12 NOTE — Progress Notes (Addendum)
Patient stated that if she wears the tele stickers too long she will get blisters on her skin. Agreeable to wear them overnight. Will pass information to day shift RN.

## 2020-08-13 DIAGNOSIS — D500 Iron deficiency anemia secondary to blood loss (chronic): Secondary | ICD-10-CM

## 2020-08-13 DIAGNOSIS — G4733 Obstructive sleep apnea (adult) (pediatric): Secondary | ICD-10-CM

## 2020-08-13 DIAGNOSIS — I10 Essential (primary) hypertension: Secondary | ICD-10-CM | POA: Diagnosis not present

## 2020-08-13 DIAGNOSIS — I509 Heart failure, unspecified: Secondary | ICD-10-CM | POA: Diagnosis not present

## 2020-08-13 DIAGNOSIS — D649 Anemia, unspecified: Secondary | ICD-10-CM

## 2020-08-13 DIAGNOSIS — R0902 Hypoxemia: Secondary | ICD-10-CM

## 2020-08-13 DIAGNOSIS — I5033 Acute on chronic diastolic (congestive) heart failure: Secondary | ICD-10-CM | POA: Diagnosis not present

## 2020-08-13 DIAGNOSIS — E662 Morbid (severe) obesity with alveolar hypoventilation: Secondary | ICD-10-CM

## 2020-08-13 LAB — COMPREHENSIVE METABOLIC PANEL
ALT: 12 U/L (ref 0–44)
AST: 12 U/L — ABNORMAL LOW (ref 15–41)
Albumin: 3.5 g/dL (ref 3.5–5.0)
Alkaline Phosphatase: 35 U/L — ABNORMAL LOW (ref 38–126)
Anion gap: 12 (ref 5–15)
BUN: 24 mg/dL — ABNORMAL HIGH (ref 6–20)
CO2: 34 mmol/L — ABNORMAL HIGH (ref 22–32)
Calcium: 8.9 mg/dL (ref 8.9–10.3)
Chloride: 95 mmol/L — ABNORMAL LOW (ref 98–111)
Creatinine, Ser: 1.04 mg/dL — ABNORMAL HIGH (ref 0.44–1.00)
GFR, Estimated: >60 mL/min (ref >60)
Glucose, Bld: 95 mg/dL (ref 70–99)
Potassium: 4 mmol/L (ref 3.5–5.1)
Sodium: 141 mmol/L (ref 135–145)
Total Bilirubin: 0.9 mg/dL (ref 0.3–1.2)
Total Protein: 6.8 g/dL (ref 6.5–8.1)

## 2020-08-13 LAB — CBC WITH DIFFERENTIAL/PLATELET
Abs Immature Granulocytes: 0.02 10*3/uL (ref 0.00–0.07)
Basophils Absolute: 0 10*3/uL (ref 0.0–0.1)
Basophils Relative: 0 %
Eosinophils Absolute: 0.2 10*3/uL (ref 0.0–0.5)
Eosinophils Relative: 2 %
HCT: 36.6 % (ref 36.0–46.0)
Hemoglobin: 9.9 g/dL — ABNORMAL LOW (ref 12.0–15.0)
Immature Granulocytes: 0 %
Lymphocytes Relative: 18 %
Lymphs Abs: 1.5 10*3/uL (ref 0.7–4.0)
MCH: 19.4 pg — ABNORMAL LOW (ref 26.0–34.0)
MCHC: 27 g/dL — ABNORMAL LOW (ref 30.0–36.0)
MCV: 71.9 fL — ABNORMAL LOW (ref 80.0–100.0)
Monocytes Absolute: 0.9 10*3/uL (ref 0.1–1.0)
Monocytes Relative: 11 %
Neutro Abs: 5.7 10*3/uL (ref 1.7–7.7)
Neutrophils Relative %: 69 %
Platelets: 224 10*3/uL (ref 150–400)
RBC: 5.09 MIL/uL (ref 3.87–5.11)
RDW: 25.1 % — ABNORMAL HIGH (ref 11.5–15.5)
WBC: 8.3 10*3/uL (ref 4.0–10.5)
nRBC: 0 % (ref 0.0–0.2)

## 2020-08-13 LAB — FOLATE: Folate: 18.3 ng/mL (ref >5.9)

## 2020-08-13 LAB — MAGNESIUM: Magnesium: 1.6 mg/dL — ABNORMAL LOW (ref 1.7–2.4)

## 2020-08-13 LAB — VITAMIN B12: Vitamin B-12: 748 pg/mL (ref 180–914)

## 2020-08-13 LAB — IRON AND TIBC
Iron: 21 ug/dL — ABNORMAL LOW (ref 28–170)
Saturation Ratios: 6 % — ABNORMAL LOW (ref 10.4–31.8)
TIBC: 379 ug/dL (ref 250–450)
UIBC: 358 ug/dL

## 2020-08-13 LAB — FERRITIN: Ferritin: 70 ng/mL (ref 11–307)

## 2020-08-13 LAB — PHOSPHORUS: Phosphorus: 4.6 mg/dL (ref 2.5–4.6)

## 2020-08-13 MED ORDER — SODIUM CHLORIDE 0.9 % IV SOLN
510.0000 mg | Freq: Once | INTRAVENOUS | Status: AC
  Administered 2020-08-13: 510 mg via INTRAVENOUS
  Filled 2020-08-13: qty 510

## 2020-08-13 MED ORDER — FUROSEMIDE 10 MG/ML IJ SOLN
60.0000 mg | Freq: Three times a day (TID) | INTRAMUSCULAR | Status: DC
  Administered 2020-08-13 – 2020-08-15 (×6): 60 mg via INTRAVENOUS
  Filled 2020-08-13 (×6): qty 6

## 2020-08-13 MED ORDER — ENOXAPARIN SODIUM 100 MG/ML ~~LOC~~ SOLN
100.0000 mg | Freq: Every day | SUBCUTANEOUS | Status: DC
  Administered 2020-08-13 – 2020-08-15 (×3): 100 mg via SUBCUTANEOUS
  Filled 2020-08-13 (×3): qty 1

## 2020-08-13 MED ORDER — INFLUENZA VAC SPLIT QUAD 0.5 ML IM SUSY
0.5000 mL | PREFILLED_SYRINGE | Freq: Once | INTRAMUSCULAR | Status: AC
  Administered 2020-08-13: 0.5 mL via INTRAMUSCULAR
  Filled 2020-08-13: qty 0.5

## 2020-08-13 NOTE — Progress Notes (Signed)
Subjective:  Denies SSCP, palpitations chronic dyspnea improved   Objective:  Vitals:   08/12/20 0637 08/12/20 1452 08/12/20 2032 08/13/20 0618  BP:  (!) 153/65 139/72 (!) 156/71  Pulse:  80 85 74  Resp:  16 16 18   Temp:  98.2 F (36.8 C) 99 F (37.2 C) 98 F (36.7 C)  TempSrc:  Oral Oral Oral  SpO2:  93% 94% 95%  Weight: (!) 227.3 kg   (!) 224.5 kg  Height:        Intake/Output from previous day:  Intake/Output Summary (Last 24 hours) at 08/13/2020 0953 Last data filed at 08/13/2020 0800 Gross per 24 hour  Intake 795 ml  Output 2800 ml  Net -2005 ml    Physical Exam: Affect appropriate Morbidly obese black female  HEENT: normal Neck supple with no adenopathy JVP normal no bruits no thyromegaly Lungs clear with no wheezing and good diaphragmatic motion Heart:  S1/S2 no murmur, no rub, gallop or click PMI normal Abdomen: benighn, BS positve, no tenderness, no AAA no bruit.  No HSM or HJR Distal pulses intact with no bruits Plus 3 bilateral edema Neuro non-focal Skin warm and dry No muscular weakness   Lab Results: Basic Metabolic Panel: Recent Labs    08/12/20 0614 08/13/20 0449  NA 139 141  K 3.5 4.0  CL 96* 95*  CO2 34* 34*  GLUCOSE 117* 95  BUN 18 24*  CREATININE 1.14* 1.04*  CALCIUM 8.5* 8.9  MG 1.7 1.6*  PHOS 4.7* 4.6   Liver Function Tests: Recent Labs    08/12/20 0614 08/13/20 0449  AST 14* 12*  ALT 12 12  ALKPHOS 41 35*  BILITOT 0.8 0.9  PROT 6.8 6.8  ALBUMIN 3.5 3.5   No results for input(s): LIPASE, AMYLASE in the last 72 hours. CBC: Recent Labs    08/12/20 0614 08/13/20 0824  WBC 8.6 8.3  NEUTROABS 5.9 5.7  HGB 9.0* 9.9*  HCT 34.0* 36.6  MCV 74.6* 71.9*  PLT 194 224   Cardiac Enzymes: No results for input(s): CKTOTAL, CKMB, CKMBINDEX, TROPONINI in the last 72 hours. BNP: Invalid input(s): POCBNP D-Dimer: No results for input(s): DDIMER in the last 72 hours. Hemoglobin A1C: No results for input(s): HGBA1C  in the last 72 hours. Fasting Lipid Panel: No results for input(s): CHOL, HDL, LDLCALC, TRIG, CHOLHDL, LDLDIRECT in the last 72 hours. Thyroid Function Tests: No results for input(s): TSH, T4TOTAL, T3FREE, THYROIDAB in the last 72 hours.  Invalid input(s): FREET3 Anemia Panel: Recent Labs    08/13/20 0449  VITAMINB12 748  FOLATE 18.3  FERRITIN 70  TIBC 379  IRON 21*    Imaging: DG CHEST PORT 1 VIEW  Result Date: 08/12/2020 CLINICAL DATA:  Short of breath EXAM: PORTABLE CHEST 1 VIEW COMPARISON:  08/10/2020 FINDINGS: Cardiac enlargement with vascular congestion similar to prior study. No significant edema or effusion. Mild left lower lobe atelectasis. IMPRESSION: Cardiac enlargement with mild vascular congestion similar to the prior study. Mild fluid overload. Electronically Signed   By: Franchot Gallo M.D.   On: 08/12/2020 10:33    Cardiac Studies:  ECG: SR no acute changes    Telemetry:  NSR 08/13/2020   Echo: 08/11/20 EF 36-64% grade 2 diastolic dysfunction Mild RVE no valve disease   Medications:   . atorvastatin  40 mg Oral q1800  . calcium-vitamin D  1 tablet Oral Q breakfast  . famotidine  40 mg Oral BID  . folic acid  1 mg  Oral Daily  . furosemide  60 mg Intravenous BID  . influenza vac split quadrivalent PF  0.5 mL Intramuscular Tomorrow-1000  . loratadine  10 mg Oral Daily  . losartan  100 mg Oral Daily  . mouth rinse  15 mL Mouth Rinse BID  . metoprolol succinate  25 mg Oral Daily  . predniSONE  10 mg Oral Daily  . sodium chloride flush  3 mL Intravenous Q12H  . triamcinolone ointment  1 application Topical BID  . cyanocobalamin  1,000 mcg Oral Daily     . sodium chloride    . ferumoxytol      Assessment/Plan:   1. Diastolic CHF:  Acute on chronic. Major issues are morbid obesity, poor diet and OSA. Elevated PA pressures are WHO class 2/3 and don't think she would benefit from right heart cath as directed vasodilators only indicated for WHO class 1.   Primary goal of Rx is diuresis. 60 iv lasix bid is fine. Continue CPAP she needs to f/u with Dr Elsworth Soho pulmonary to optimize. Weight loss does not seem to be on her agenda and she is extremely sedentary  Suspect she will need to go home on Demadex 40 bid. Would diurese until azotemic supplement K.  Note her BNP was only 34 and a lot of her weight gain is simple obesity  She diuresed over 2 Liters yesterday suspect she will need 2 more days of iv diuresis and ? D/c on Sunday   Jenkins Rouge 08/13/2020, 9:53 AM

## 2020-08-13 NOTE — Progress Notes (Signed)
Pt states she will self administer CPAP when ready for bed.  Pt to notify RT if she needs assistance.

## 2020-08-13 NOTE — Care Management Important Message (Signed)
Important Message  Patient Details IM Letter given to the Patient Name: [name] MRN: [id number] Date of Birth: [birth year]   Medicare Important Message Given:  Yes     Kerin Salen 08/13/2020, 10:37 AM

## 2020-08-13 NOTE — Progress Notes (Signed)
PROGRESS NOTE    [name]  YTK:[id number] DOB: 1966-02-25 DOA: 08/10/2020 PCP: [name], NP     Brief Narrative:  Otilio Jefferson Carltonis a 54 y.o.BF PMHx Chronic Diastolic CHF, iron deficiency anemia, chronic respiratory failure with hypoxia/COPD on 2 L O2, sarcoidosis, OSA/OHS, morbidly obese, GERD, hypothyroidism and hypertension   who presented to the ER with worsening shortness of breath and increased weight in the last month. She has gained about 15 pounds. Patient normally is on 2 L of oxygen at home and uses CPAP at night. She was seen in the ER with slight increase in oxygen requirement. She has history of COPD but no wheezing. She is currently requiring 3 L of oxygen. She is also notably having significant edema. Patient has been taking oral Bumex at home but has not been having significant response. She is being admitted for IV diuretics and reevaluation. She had her last echocardiogram a year ago where the EF was 55 to 60%. She denied any chest pain. Denied any recent cardiac work-up. She has sarcoidosis and is on chronic prednisone..  ED Course:Temperature 98 blood pressure 170/75 pulse 111 respirate of 38 oxygen sats 93% on 3 L. White count is 9.0 hemoglobin 9.1 platelets 191. Sodium 143 potassium 4.0 chloride 102 CO2 34 BUN 16 creatinine 0.90 calcium 8.8. Respiratory panel is negative. Fecal occult blood testing negative x2. PT/INR 14 and 1.06. Chest x-ray showed cardiomegaly with pulmonary vascular and trace fluid in the right effusion acute interstitial edema. Patient being admitted for evaluation and treatment   Subjective: Afebrile overnight A/O x4, positive acute on chronic S OB.  Positive significant weight gain states she notified her cardiologist Dr. [name] who upped her Lasix at home without resolution.  When patient had increasing S OB decided to come to the hospital.  Per patient patient was never given a base weight by  cardiology  Assessment & Plan: Covid vaccination; vaccinated   Principal Problem:   Acute on chronic diastolic (congestive) heart failure ([place]) Active Problems:   OSA (obstructive sleep apnea)   Obesity hypoventilation syndrome (HCC)   Hypertension   Sarcoidosis   Iron deficiency anemia due to chronic blood loss   Hypoxemia   GERD (gastroesophageal reflux disease)   Chronic Diastolic CHF  Right Sided Heart Failure  Pulmonary Hypertension: EF 55-60%, grade II diastolic dysfunction, RV pressure overload, mildly reduced RV systolic function, moderately elevated PASP (see report) -10/14 increase Lasix IV 60 mg  TID (she's on 40 mg bumex at home daily) -Cardiology c/s, recommending continuing diuresis -> elevated PA pressures WHO 2/3, no recommendation for [facility] cath, continue cpap  -Strict I&O -3.0 L -Daily weight Filed Weights   08/11/20 1716 08/12/20 0637 08/13/20 0618  Weight: (!) 223 kg (!) 227.3 kg (!) 224.5 kg  -Losartan 100 mg daily -Toprol 25 mg daily  Essential HTN -See CHF  OSA/OHS  -CPAP per respiratory  GERD: -Continue PPIs.  Sarcoidosis -Prednisone 10 mg daily -10/15 in a.m. discuss who patient sees for her sarcoidosis.  While on chronic steroids instead of DMARD?  iron deficiency anemia:  -Follow iron, b12, folate, ferritin.  Hb relatively stable.    DVT prophylaxis: Lovenox Code Status: Full Family Communication:  Status is: Inpatient    Dispo: The patient is from: Home              Anticipated d/c is to: Home              Anticipated d/c date is: 10/17  Patient currently unstable      Consultants:    Procedures/Significant Events:    I have personally reviewed and interpreted all radiology studies and my findings are as above.  VENTILATOR SETTINGS: Nasal cannula 10/14 Flow 2 L/min SPO2 92%   Cultures   Antimicrobials:    Devices    LINES / TUBES:      Continuous Infusions: . sodium chloride    .  ferumoxytol 510 mg (08/13/20 1439)     Objective: Vitals:   08/12/20 1452 08/12/20 2032 08/13/20 0618 08/13/20 1247  BP: (!) 153/65 139/72 (!) 156/71 133/76  Pulse: 80 85 74 85  Resp: 16 16 18 18   Temp: 98.2 F (36.8 C) 99 F (37.2 C) 98 F (36.7 C) 98.9 F (37.2 C)  TempSrc: Oral Oral Oral Oral  SpO2: 93% 94% 95% 92%  Weight:   (!) 224.5 kg   Height:        Intake/Output Summary (Last 24 hours) at 08/13/2020 1446 Last data filed at 08/13/2020 1100 Gross per 24 hour  Intake 1035 ml  Output 2800 ml  Net -1765 ml   Filed Weights   08/11/20 1716 08/12/20 0637 08/13/20 0618  Weight: (!) 223 kg (!) 227.3 kg (!) 224.5 kg    Examination:  General: A/O x4, positive chronic respiratory distress Eyes: negative scleral hemorrhage, negative anisocoria, negative icterus ENT: Negative Runny nose, negative gingival bleeding, Neck:  Negative scars, masses, torticollis, lymphadenopathy, JVD Lungs: Clear to auscultation bilaterally without wheezes or crackles Cardiovascular: Regular rate and rhythm without murmur gallop or rub normal S1 and S2 Abdomen: MORBIDLY OBESE, negative abdominal pain, nondistended, positive soft, bowel sounds, no rebound, no ascites, no appreciable mass Extremities: anasarca Skin: Negative rashes, lesions, ulcers Psychiatric:  Negative depression, negative anxiety, negative fatigue, negative mania  Central nervous system:  Cranial nerves II through XII intact, tongue/uvula midline, all extremities muscle strength 5/5, sensation intact throughout, negative dysarthria, negative expressive aphasia, negative receptive aphasia.  .     Data Reviewed: Care during the described time interval was provided by me .  I have reviewed this patient's available data, including medical history, events of note, physical examination, and all test results as part of my evaluation.  CBC: Recent Labs  Lab 08/10/20 1938 08/11/20 0437 08/12/20 0614 08/13/20 0824  WBC 9.0  7.8 8.6 8.3  NEUTROABS 6.8  --  5.9 5.7  HGB 9.2* 8.9* 9.0* 9.9*  HCT 34.3* 33.7* 34.0* 36.6  MCV 73.6* 74.4* 74.6* 71.9*  PLT 191 191 194 681   Basic Metabolic Panel: Recent Labs  Lab 08/10/20 1938 08/11/20 0437 08/12/20 0614 08/13/20 0449  NA 143 141 139 141  K 4.0 3.9 3.5 4.0  CL 102 99 96* 95*  CO2 34* 31 34* 34*  GLUCOSE 98 98 117* 95  BUN 16 14 18  24*  CREATININE 0.90 0.91 1.14* 1.04*  CALCIUM 8.8* 8.7* 8.5* 8.9  MG  --   --  1.7 1.6*  PHOS  --   --  4.7* 4.6   GFR: Estimated Creatinine Clearance: 122.4 mL/min (A) (by C-G formula based on SCr of 1.04 mg/dL (H)). Liver Function Tests: Recent Labs  Lab 08/10/20 1938 08/12/20 0614 08/13/20 0449  AST 15 14* 12*  ALT 14 12 12   ALKPHOS 42 41 35*  BILITOT 0.6 0.8 0.9  PROT 7.2 6.8 6.8  ALBUMIN 3.6 3.5 3.5   No results for input(s): LIPASE, AMYLASE in the last 168 hours. No results for input(s): AMMONIA in  the last 168 hours. Coagulation Profile: No results for input(s): INR, PROTIME in the last 168 hours. Cardiac Enzymes: No results for input(s): CKTOTAL, CKMB, CKMBINDEX, TROPONINI in the last 168 hours. BNP (last 3 results) No results for input(s): PROBNP in the last 8760 hours. HbA1C: No results for input(s): HGBA1C in the last 72 hours. CBG: No results for input(s): GLUCAP in the last 168 hours. Lipid Profile: No results for input(s): CHOL, HDL, LDLCALC, TRIG, CHOLHDL, LDLDIRECT in the last 72 hours. Thyroid Function Tests: No results for input(s): TSH, T4TOTAL, FREET4, T3FREE, THYROIDAB in the last 72 hours. Anemia Panel: Recent Labs    08/13/20 0449  VITAMINB12 748  FOLATE 18.3  FERRITIN 70  TIBC 379  IRON 21*   Sepsis Labs: No results for input(s): PROCALCITON, LATICACIDVEN in the last 168 hours.  Recent Results (from the past 240 hour(s))  Respiratory Panel by RT PCR (Flu A&B, Covid) - Nasopharyngeal Swab     Status: None   Collection Time: 08/10/20  7:45 PM   Specimen: Nasopharyngeal Swab   Result Value Ref Range Status   SARS Coronavirus 2 by RT PCR NEGATIVE NEGATIVE Final    Comment: (NOTE) SARS-CoV-2 target nucleic acids are NOT DETECTED.  The SARS-CoV-2 RNA is generally detectable in upper respiratoy specimens during the acute phase of infection. The lowest concentration of SARS-CoV-2 viral copies this assay can detect is 131 copies/mL. A negative result does not preclude SARS-Cov-2 infection and should not be used as the sole basis for treatment or other patient management decisions. A negative result may occur with  improper specimen collection/handling, submission of specimen other than nasopharyngeal swab, presence of viral mutation(s) within the areas targeted by this assay, and inadequate number of viral copies (<131 copies/mL). A negative result must be combined with clinical observations, patient history, and epidemiological information. The expected result is Negative.  Fact Sheet for Patients:  PinkCheek.be  Fact Sheet for Healthcare Providers:  GravelBags.it  This test is no t yet approved or cleared by the [place] FDA and  has been authorized for detection and/or diagnosis of SARS-CoV-2 by FDA under an Emergency Use Authorization (EUA). This EUA will remain  in effect (meaning this test can be used) for the duration of the COVID-19 declaration under Section 564(b)(1) of the Act, 21 U.S.C. section 360bbb-3(b)(1), unless the authorization is terminated or revoked sooner.     Influenza A by PCR NEGATIVE NEGATIVE Final   Influenza B by PCR NEGATIVE NEGATIVE Final    Comment: (NOTE) The Xpert Xpress SARS-CoV-2/FLU/RSV assay is intended as an aid in  the diagnosis of influenza from Nasopharyngeal swab specimens and  should not be used as a sole basis for treatment. Nasal washings and  aspirates are unacceptable for Xpert Xpress SARS-CoV-2/FLU/RSV  testing.  Fact Sheet for  Patients: PinkCheek.be  Fact Sheet for Healthcare Providers: GravelBags.it  This test is not yet approved or cleared by the [place] FDA and  has been authorized for detection and/or diagnosis of SARS-CoV-2 by  FDA under an Emergency Use Authorization (EUA). This EUA will remain  in effect (meaning this test can be used) for the duration of the  Covid-19 declaration under Section 564(b)(1) of the Act, 21  U.S.C. section 360bbb-3(b)(1), unless the authorization is  terminated or revoked. Performed at [facility], [place] [address], [place], [place] [address]          Radiology Studies: DG CHEST PORT 1 VIEW  Result Date: 08/12/2020 CLINICAL DATA:  Short of breath EXAM: PORTABLE CHEST 1 VIEW COMPARISON:  08/10/2020 FINDINGS: Cardiac enlargement with vascular congestion similar to prior study. No significant edema or effusion. Mild left lower lobe atelectasis. IMPRESSION: Cardiac enlargement with mild vascular congestion similar to the prior study. Mild fluid overload. Electronically Signed   By: Franchot Gallo M.D.   On: 08/12/2020 10:33        Scheduled Meds: . atorvastatin  40 mg Oral q1800  . calcium-vitamin D  1 tablet Oral Q breakfast  . famotidine  40 mg Oral BID  . folic acid  1 mg Oral Daily  . furosemide  60 mg Intravenous BID  . loratadine  10 mg Oral Daily  . losartan  100 mg Oral Daily  . mouth rinse  15 mL Mouth Rinse BID  . metoprolol succinate  25 mg Oral Daily  . predniSONE  10 mg Oral Daily  . sodium chloride flush  3 mL Intravenous Q12H  . triamcinolone ointment  1 application Topical BID  . cyanocobalamin  1,000 mcg Oral Daily   Continuous Infusions: . sodium chloride    . ferumoxytol 510 mg (08/13/20 1439)     LOS: 3 days    Time spent:40 min    Gailen Venne, Geraldo Docker, MD [facility] Pager [phone]  If 7PM-7AM, please contact  night-coverage [email or web address] Password [email or web address] 08/13/2020, 2:46 PM

## 2020-08-13 NOTE — Progress Notes (Addendum)
HEMATOLOGY-ONCOLOGY PROGRESS NOTE  SUBJECTIVE: [name] is followed in our office for iron deficiency anemia secondary to menorrhagia.  She received IV Feraheme.  She is currently admitted with CHF exacerbation.  She continues to have anemia.  The patient reports that her shortness of breath is somewhat improved.  Lower extremity edema is ongoing but mildly improved with diuresis.  She denies any active bleeding.  States that her period last month was just spotting.  REVIEW OF SYSTEMS:   Constitutional: Denies fevers, chills or abnormal weight loss Eyes: Denies blurriness of vision Ears, nose, mouth, throat, and face: Denies mucositis or sore throat Respiratory: Shortness of breath improved Cardiovascular: Denies chest pain, has ongoing lower extremity edema Gastrointestinal:  Denies nausea, heartburn or change in bowel habits Skin: Denies abnormal skin rashes Lymphatics: Denies new lymphadenopathy or easy bruising Neurological:Denies numbness, tingling or new weaknesses Behavioral/Psych: Mood is stable, no new changes  Extremities: No lower extremity edema All other systems were reviewed with the patient and are negative.  I have reviewed the past medical history, past surgical history, social history and family history with the patient and they are unchanged from previous note.   PHYSICAL EXAMINATION:  Vitals:   08/12/20 2032 08/13/20 0618  BP: 139/72 (!) 156/71  Pulse: 85 74  Resp: 16 18  Temp: 99 F (37.2 C) 98 F (36.7 C)  SpO2: 94% 95%   Filed Weights   08/11/20 1716 08/12/20 0637 08/13/20 0618  Weight: (!) 223 kg (!) 227.3 kg (!) 224.5 kg    Intake/Output from previous day: 10/13 0701 - 10/14 0700 In: 1035 [P.O.:1035] Out: 2800 [Urine:2800]  GENERAL:alert, no distress and comfortable LUNGS: clear to auscultation and percussion with normal breathing effort HEART: regular rate & rhythm and no murmur ABDOMEN:abdomen soft, non-tender and normal bowel sounds NEURO:  alert & oriented x 3 with fluent speech, no focal motor/sensory deficits  LABORATORY DATA:  I have reviewed the data as listed CMP Latest Ref Rng & Units 08/13/2020 08/12/2020 08/11/2020  Glucose 70 - 99 mg/dL 95 117(H) 98  BUN 6 - 20 mg/dL 24(H) 18 14  Creatinine 0.44 - 1.00 mg/dL 1.04(H) 1.14(H) 0.91  Sodium 135 - 145 mmol/L 141 139 141  Potassium 3.5 - 5.1 mmol/L 4.0 3.5 3.9  Chloride 98 - 111 mmol/L 95(L) 96(L) 99  CO2 22 - 32 mmol/L 34(H) 34(H) 31  Calcium 8.9 - 10.3 mg/dL 8.9 8.5(L) 8.7(L)  Total Protein 6.5 - 8.1 g/dL 6.8 6.8 -  Total Bilirubin 0.3 - 1.2 mg/dL 0.9 0.8 -  Alkaline Phos 38 - 126 U/L 35(L) 41 -  AST 15 - 41 U/L 12(L) 14(L) -  ALT 0 - 44 U/L 12 12 -    Lab Results  Component Value Date   WBC 8.3 08/13/2020   HGB 9.9 (L) 08/13/2020   HCT 36.6 08/13/2020   MCV 71.9 (L) 08/13/2020   PLT 224 08/13/2020   NEUTROABS PENDING 08/13/2020    DG Chest 2 View  Result Date: 08/10/2020 CLINICAL DATA:  54 year old female with increasing shortness of breath for 2 weeks. Fluid retention in the extremities. EXAM: CHEST - 2 VIEW COMPARISON:  Chest CTA 08/16/2019 and earlier. FINDINGS: AP semi upright and lateral views. Stable cardiomegaly and mediastinal contours. Visualized tracheal air column is within normal limits. No pneumothorax or layering pleural effusion. Pulmonary interstitial opacity/indistinctness of pulmonary vasculature appears unchanged from last year. There is trace pleural fluid in the right minor fissure. No consolidation. No acute osseous abnormality identified.  Paucity bowel gas in the upper abdomen. IMPRESSION: Cardiomegaly with indistinct pulmonary vasculature and trace fluid in the right minor fissure. Suspect acute interstitial edema. Electronically Signed   By: Genevie Ann M.D.   On: 08/10/2020 19:01   DG CHEST PORT 1 VIEW  Result Date: 08/12/2020 CLINICAL DATA:  Short of breath EXAM: PORTABLE CHEST 1 VIEW COMPARISON:  08/10/2020 FINDINGS: Cardiac  enlargement with vascular congestion similar to prior study. No significant edema or effusion. Mild left lower lobe atelectasis. IMPRESSION: Cardiac enlargement with mild vascular congestion similar to the prior study. Mild fluid overload. Electronically Signed   By: Franchot Gallo M.D.   On: 08/12/2020 10:33   ECHOCARDIOGRAM COMPLETE  Result Date: 08/11/2020    ECHOCARDIOGRAM REPORT   Patient Name:   [name] Date of Exam: 08/11/2020 Medical Rec #:  [id number]             Height:       66.0 in Accession #:    [id number]            Weight:       505.0 lb Date of Birth:  [birth year]             BSA:          2.964 m Patient Age:    65 years              BP:           139/71 mmHg Patient Gender: F                     HR:           81 bpm. Exam Location:  Inpatient Procedure: 2D Echo and Intracardiac Opacification Agent Indications:    CHF-Acute Diastolic 932.35 / T73.22  History:        Patient has prior history of Echocardiogram examinations, most                 recent 08/17/2019. CHF, COPD, Signs/Symptoms:Shortness of                 Breath; Risk Factors:Former Smoker, Sleep Apnea and                 Hypertension. Iron deficiency anemia, GERD, Sarcoidosis,                 Hypothyroidsm.  Sonographer:    Darlina Sicilian RDCS Referring Phys: [phone] [facility] Julious Oka  Sonographer Comments: Image acquisition challenging due to patient body habitus. IMPRESSIONS  1. Left ventricular ejection fraction, by estimation, is 55 to 60%. The left ventricle has normal function. The left ventricle has no regional wall motion abnormalities. There is mild concentric left ventricular hypertrophy. Left ventricular diastolic parameters are consistent with Grade II diastolic dysfunction (pseudonormalization). There is the interventricular septum is flattened in systole, consistent with right ventricular pressure overload.  2. Right ventricular systolic function is mildly reduced. The right ventricular size is mildly  enlarged. There is moderately elevated pulmonary artery systolic pressure.  3. The mitral valve is normal in structure. No evidence of mitral valve regurgitation.  4. The aortic valve is normal in structure. Aortic valve regurgitation is not visualized. Comparison(s): A prior study was performed on 08/17/2019. Prior images reviewed side by side. The right ventricular hypertrophy is worse. There is also more prominent systolic flattening of the ventricular septum and worsened pulmonary artery hypertension and increased right atrial pressure. There is also prominent respiratory septal  displacement; in the absence of other signs of constrictive physiology aor pericardial tamponade, this is likely due to markedly increased work of breathing (e.g. asthma/COPD exacerbation, severe obesity, etc.). FINDINGS  Left Ventricle: Left ventricular ejection fraction, by estimation, is 55 to 60%. The left ventricle has normal function. The left ventricle has no regional wall motion abnormalities. Definity contrast agent was given IV to delineate the left ventricular  endocardial borders. The left ventricular internal cavity size was normal in size. There is mild concentric left ventricular hypertrophy. The interventricular septum is flattened in systole, consistent with right ventricular pressure overload. Left ventricular diastolic parameters are consistent with Grade II diastolic dysfunction (pseudonormalization). Indeterminate filling pressures. Right Ventricle: The right ventricular size is mildly enlarged. No increase in right ventricular wall thickness. Right ventricular systolic function is mildly reduced. There is moderately elevated pulmonary artery systolic pressure. The tricuspid regurgitant velocity is 3.14 m/s, and with an assumed right atrial pressure of 15 mmHg, the estimated right ventricular systolic pressure is 62.2 mmHg. Left Atrium: Left atrial size was normal in size. Right Atrium: Right atrial size was not well  visualized. Pericardium: There is no evidence of pericardial effusion. Mitral Valve: The mitral valve is normal in structure. No evidence of mitral valve regurgitation. Tricuspid Valve: The tricuspid valve is grossly normal. Tricuspid valve regurgitation is trivial. Aortic Valve: The aortic valve is normal in structure. Aortic valve regurgitation is not visualized. Pulmonic Valve: The pulmonic valve was not well visualized. Pulmonic valve regurgitation is not visualized. Aorta: The aortic root and ascending aorta are structurally normal, with no evidence of dilitation. IAS/Shunts: The interatrial septum was not well visualized.  LEFT VENTRICLE PLAX 2D LVIDd:         5.00 cm  Diastology LVIDs:         3.15 cm  LV e' medial:    6.74 cm/s LV PW:         1.15 cm  LV E/e' medial:  10.9 LV IVS:        1.30 cm  LV e' lateral:   5.00 cm/s LVOT diam:     2.00 cm  LV E/e' lateral: 14.7 LV SV:         47 LV SV Index:   16 LVOT Area:     3.14 cm  LEFT ATRIUM           Index LA diam:      3.65 cm 1.23 cm/m LA Vol (A4C): 39.7 ml 13.39 ml/m  AORTIC VALVE LVOT Vmax:   77.00 cm/s LVOT Vmean:  53.500 cm/s LVOT VTI:    0.149 m  AORTA Ao Root diam: 2.70 cm MITRAL VALVE               TRICUSPID VALVE MV Area (PHT): 5.27 cm    TR Peak grad:   39.4 mmHg MV Decel Time: 144 msec    TR Vmax:        314.00 cm/s MV E velocity: 73.70 cm/s MV A velocity: 46.70 cm/s  SHUNTS MV E/A ratio:  1.58        Systemic VTI:  0.15 m                            Systemic Diam: 2.00 cm Dani Gobble Croitoru MD Electronically signed by Sanda Klein MD Signature Date/Time: 08/11/2020/8:45:05 AM    Final     ASSESSMENT AND PLAN: 1.  Iron deficiency anemia 2.  CHF exacerbation  3.  Pulmonary hypertension 4.  Obstructive sleep apnea 5.  GERD 6.  Hypertension 7.  Sarcoidosis 8.  Morbid obesity  -Hemoglobin stable this admission at 9.9 today.  Ferritin is normal at 70 however, iron level and percent saturation are low.  Vitamin B12 level and folate levels  are normal.  I have recommended for the patient to proceed with Feraheme 510 mg IV x1 dose.  She is very familiar with the potential side effects and agrees to proceed. -Continue diuresis per cardiology recommendations. -We will arrange for outpatient follow-up in our office.    LOS: 3 days   Mikey Bussing, DNP, AGPCNP-BC, AOCNP 08/13/20   Addendum  I have seen the patient, examined her. I agree with the assessment and and plan and have edited the notes.   Lab reviewed, she has mild iron deficiency, given her moderate anemia and CHF, we decided to give her one dose feraheme, she tolerated well this afternoon. She is scheduled to see me back in 3 weeks in office.   Truitt Merle  08/13/2020

## 2020-08-13 NOTE — Progress Notes (Addendum)
ANTICOAGULATION CONSULT NOTE - Initial Consult  Pharmacy Consult for Lovenox Indication: VTE prophylaxis  Allergies  Allergen Reactions  . Tape Itching    Leads cause Skin burns and irritation after 24 hours+    Patient Measurements: Height: 5\' 6"  (167.6 cm) Weight: (!) 224.5 kg (495 lb) IBW/kg (Calculated) : 59.3 Heparin Dosing Weight:   Vital Signs: Temp: 98.9 F (37.2 C) (10/14 1247) Temp Source: Oral (10/14 1247) BP: 133/76 (10/14 1247) Pulse Rate: 85 (10/14 1247)  Labs: Recent Labs    08/10/20 1938 08/10/20 1938 08/11/20 0437 08/11/20 0437 08/12/20 0614 08/13/20 0449 08/13/20 0824  HGB 9.2*   < > 8.9*   < > 9.0*  --  9.9*  HCT 34.3*   < > 33.7*  --  34.0*  --  36.6  PLT 191   < > 191  --  194  --  224  CREATININE 0.90   < > 0.91  --  1.14* 1.04*  --   TROPONINIHS 6  --  6  --   --   --   --    < > = values in this interval not displayed.    Estimated Creatinine Clearance: 122.4 mL/min (A) (by C-G formula based on SCr of 1.04 mg/dL (H)).   Medical History: Past Medical History:  Diagnosis Date  . Anemia   . Arthritis    hands, shoulders, no meds  . CHF (congestive heart failure) (HCC)    EF60-65%  . Chronic hyperventilation syndrome    w/ obesity tx with albuterol inhaler and oxygen 2L  . COPD (chronic obstructive pulmonary disease) ([place])    uses oxygen 2 L  . Gallstones   . GERD (gastroesophageal reflux disease)    diet controlled - no meds  . H/O hiatal hernia   . Hypertension   . Hypothyroidism   . Kidney stones   . Morbid obesity ([place])   . Pneumonia    hoispitalized in 08/2011  . Sarcoidosis   . Seasonal allergies   . Sleep apnea    uses CPAP machine      Assessment: PHARMACY CONSULT: Lovenox for VTE prophylaxis  Lovenox 40mg  for VTE prophylaxis ordered on 10/12, given on 10/12.  Dose adjusted for weight for next dose due on 10/13, but order was d/c on 10/12.  No VTE prophylaxis given on 10/13.  Wt: 224.5 kg BMI:  79.9 Scr:   1.04 CrCl >30 ml/hr  CBC:  Hgb 9.9, Plt 224 (given Feraheme 10/14 for iron deficiency anemia)  Goal of Therapy:  Monitor platelets by anticoagulation protocol: Yes   Plan:  Weight-adjusted Lovenox 0.5mg /kg (100mg ) SubQ q24h  Pharmacy will sign off and peripherally monitor CBC, weight, and renal function.   Gretta Arab PharmD, BCPS Clinical Pharmacist [facility] main pharmacy [phone] 08/13/2020 3:43 PM

## 2020-08-14 DIAGNOSIS — I5033 Acute on chronic diastolic (congestive) heart failure: Secondary | ICD-10-CM | POA: Diagnosis not present

## 2020-08-14 DIAGNOSIS — D649 Anemia, unspecified: Secondary | ICD-10-CM | POA: Diagnosis not present

## 2020-08-14 DIAGNOSIS — I509 Heart failure, unspecified: Secondary | ICD-10-CM | POA: Diagnosis not present

## 2020-08-14 DIAGNOSIS — I10 Essential (primary) hypertension: Secondary | ICD-10-CM | POA: Diagnosis not present

## 2020-08-14 DIAGNOSIS — D869 Sarcoidosis, unspecified: Secondary | ICD-10-CM

## 2020-08-14 LAB — COMPREHENSIVE METABOLIC PANEL
ALT: 13 U/L (ref 0–44)
AST: 14 U/L — ABNORMAL LOW (ref 15–41)
Albumin: 4.1 g/dL (ref 3.5–5.0)
Alkaline Phosphatase: 45 U/L (ref 38–126)
Anion gap: 15 (ref 5–15)
BUN: 28 mg/dL — ABNORMAL HIGH (ref 6–20)
CO2: 35 mmol/L — ABNORMAL HIGH (ref 22–32)
Calcium: 9.1 mg/dL (ref 8.9–10.3)
Chloride: 91 mmol/L — ABNORMAL LOW (ref 98–111)
Creatinine, Ser: 1.18 mg/dL — ABNORMAL HIGH (ref 0.44–1.00)
GFR, Estimated: 52 mL/min — ABNORMAL LOW (ref >60)
Glucose, Bld: 106 mg/dL — ABNORMAL HIGH (ref 70–99)
Potassium: 4 mmol/L (ref 3.5–5.1)
Sodium: 141 mmol/L (ref 135–145)
Total Bilirubin: 0.9 mg/dL (ref 0.3–1.2)
Total Protein: 7.7 g/dL (ref 6.5–8.1)

## 2020-08-14 LAB — MAGNESIUM: Magnesium: 1.6 mg/dL — ABNORMAL LOW (ref 1.7–2.4)

## 2020-08-14 LAB — CBC
HCT: 35.8 % — ABNORMAL LOW (ref 36.0–46.0)
Hemoglobin: 9.8 g/dL — ABNORMAL LOW (ref 12.0–15.0)
MCH: 19.6 pg — ABNORMAL LOW (ref 26.0–34.0)
MCHC: 27.4 g/dL — ABNORMAL LOW (ref 30.0–36.0)
MCV: 71.7 fL — ABNORMAL LOW (ref 80.0–100.0)
Platelets: 185 10*3/uL (ref 150–400)
RBC: 4.99 MIL/uL (ref 3.87–5.11)
RDW: 25.3 % — ABNORMAL HIGH (ref 11.5–15.5)
WBC: 11 10*3/uL — ABNORMAL HIGH (ref 4.0–10.5)
nRBC: 0.2 % (ref 0.0–0.2)

## 2020-08-14 LAB — PHOSPHORUS: Phosphorus: 4.5 mg/dL (ref 2.5–4.6)

## 2020-08-14 MED ORDER — MAGNESIUM SULFATE 4 GM/100ML IV SOLN
4.0000 g | Freq: Once | INTRAVENOUS | Status: AC
  Administered 2020-08-14: 4 g via INTRAVENOUS
  Filled 2020-08-14 (×3): qty 100

## 2020-08-14 MED ORDER — METHOTREXATE SODIUM CHEMO INJECTION 50 MG/2ML: 20.0000 mg | Freq: Once | INTRAMUSCULAR | Status: DC

## 2020-08-14 MED ORDER — METHOTREXATE SODIUM CHEMO INJECTION 50 MG/2ML
20.0000 mg | Freq: Once | INTRAMUSCULAR | Status: AC
  Administered 2020-08-15: 20 mg via SUBCUTANEOUS
  Filled 2020-08-14: qty 0.8

## 2020-08-14 NOTE — Progress Notes (Signed)
PROGRESS NOTE    [name]  BDZ:[id number] DOB: 09-06-1966 DOA: 08/10/2020 PCP: [name], NP     Brief Narrative:  [name] a 54 y.o.BF PMHx Chronic Diastolic CHF, iron deficiency anemia, chronic respiratory failure with hypoxia/COPD on 2 L O2, sarcoidosis, OSA/OHS, morbidly obese, GERD, hypothyroidism and hypertension   who presented to the ER with worsening shortness of breath and increased weight in the last month. She has gained about 15 pounds. Patient normally is on 2 L of oxygen at home and uses CPAP at night. She was seen in the ER with slight increase in oxygen requirement. She has history of COPD but no wheezing. She is currently requiring 3 L of oxygen. She is also notably having significant edema. Patient has been taking oral Bumex at home but has not been having significant response. She is being admitted for IV diuretics and reevaluation. She had her last echocardiogram a year ago where the EF was 55 to 60%. She denied any chest pain. Denied any recent cardiac work-up. She has sarcoidosis and is on chronic prednisone..  ED Course:Temperature 98 blood pressure 170/75 pulse 111 respirate of 38 oxygen sats 93% on 3 L. White count is 9.0 hemoglobin 9.1 platelets 191. Sodium 143 potassium 4.0 chloride 102 CO2 34 BUN 16 creatinine 0.90 calcium 8.8. Respiratory panel is negative. Fecal occult blood testing negative x2. PT/INR 14 and 1.06. Chest x-ray showed cardiomegaly with pulmonary vascular and trace fluid in the right effusion acute interstitial edema. Patient being admitted for evaluation and treatment   Subjective: 10/15, afebrile overnight patient with poor understanding of disease process.  Does not understand that she needs to continue to diurese, and given her morbid obesity difficult to determine except through measurement.   Assessment & Plan: Covid vaccination; vaccinated   Principal Problem:   Acute on chronic  diastolic (congestive) heart failure (HCC) Active Problems:   OSA (obstructive sleep apnea)   Obesity hypoventilation syndrome (HCC)   Hypertension   Sarcoidosis   Iron deficiency anemia due to chronic blood loss   Hypoxemia   GERD (gastroesophageal reflux disease)   Chronic Diastolic CHF  Right Sided Heart Failure  Pulmonary Hypertension: EF 55-60%, grade II diastolic dysfunction, RV pressure overload, mildly reduced RV systolic function, moderately elevated PASP (see report) -10/14 increase Lasix IV 60 mg  TID (she's on 40 mg bumex at home daily) -Cardiology c/s, recommending continuing diuresis -> elevated PA pressures WHO 2/3, no recommendation for [facility] cath, continue cpap  -Strict I&O -5.6 L -Daily weight Filed Weights   08/12/20 0637 08/13/20 0618 08/14/20 0532  Weight: (!) 227.3 kg (!) 224.5 kg (!) 223.8 kg  -Losartan 100 mg daily -Toprol 25 mg daily  Essential HTN -See CHF  OSA/OHS  -CPAP per respiratory  GERD: -Continue PPIs.  Sarcoidosis -Prednisone 10 mg daily -10/15 in a.m. discuss who patient sees for her sarcoidosis.  While on chronic steroids instead of DMARD?  iron deficiency anemia:  -Follow iron, b12, folate, ferritin.  Hb relatively stable.  Hypomagnesmia -Magnesium goal> 2 -Magnesium IV 3 g     DVT prophylaxis: Lovenox Code Status: Full Family Communication:  Status is: Inpatient    Dispo: The patient is from: Home              Anticipated d/c is to: Home              Anticipated d/c date is: 10/17  Patient currently unstable      Consultants:    Procedures/Significant Events:    I have personally reviewed and interpreted all radiology studies and my findings are as above.  VENTILATOR SETTINGS: Nasal cannula 10/15 Flow 2 L/min SPO2 100%   Cultures   Antimicrobials:    Devices    LINES / TUBES:      Continuous Infusions: . sodium chloride       Objective: Vitals:   08/13/20 2034  08/13/20 2130 08/14/20 0532 08/14/20 1308  BP:  137/64 139/62 (!) 151/74  Pulse:  76 60 81  Resp: 18 18 18 20   Temp:  98.4 F (36.9 C) 97.8 F (36.6 C) 98.2 F (36.8 C)  TempSrc:  Oral Oral Oral  SpO2:  96% 100% 100%  Weight:   (!) 223.8 kg   Height:        Intake/Output Summary (Last 24 hours) at 08/14/2020 1648 Last data filed at 08/14/2020 1049 Gross per 24 hour  Intake --  Output 2750 ml  Net -2750 ml   Filed Weights   08/12/20 6440 08/13/20 0618 08/14/20 0532  Weight: (!) 227.3 kg (!) 224.5 kg (!) 223.8 kg    Examination:  General: A/O x4, positive chronic respiratory distress Eyes: negative scleral hemorrhage, negative anisocoria, negative icterus ENT: Negative Runny nose, negative gingival bleeding, Neck:  Negative scars, masses, torticollis, lymphadenopathy, JVD Lungs: Clear to auscultation bilaterally without wheezes or crackles Cardiovascular: Regular rate and rhythm without murmur gallop or rub normal S1 and S2 Abdomen: MORBIDLY OBESE, negative abdominal pain, nondistended, positive soft, bowel sounds, no rebound, no ascites, no appreciable mass Extremities: anasarca Skin: Negative rashes, lesions, ulcers Psychiatric:  Negative depression, negative anxiety, negative fatigue, negative mania  Central nervous system:  Cranial nerves II through XII intact, tongue/uvula midline, all extremities muscle strength 5/5, sensation intact throughout, negative dysarthria, negative expressive aphasia, negative receptive aphasia.  .     Data Reviewed: Care during the described time interval was provided by me .  I have reviewed this patient's available data, including medical history, events of note, physical examination, and all test results as part of my evaluation.  CBC: Recent Labs  Lab 08/10/20 1938 08/11/20 0437 08/12/20 0614 08/13/20 0824  WBC 9.0 7.8 8.6 8.3  NEUTROABS 6.8  --  5.9 5.7  HGB 9.2* 8.9* 9.0* 9.9*  HCT 34.3* 33.7* 34.0* 36.6  MCV 73.6* 74.4*  74.6* 71.9*  PLT 191 191 194 347   Basic Metabolic Panel: Recent Labs  Lab 08/10/20 1938 08/11/20 0437 08/12/20 0614 08/13/20 0449  NA 143 141 139 141  K 4.0 3.9 3.5 4.0  CL 102 99 96* 95*  CO2 34* 31 34* 34*  GLUCOSE 98 98 117* 95  BUN 16 14 18  24*  CREATININE 0.90 0.91 1.14* 1.04*  CALCIUM 8.8* 8.7* 8.5* 8.9  MG  --   --  1.7 1.6*  PHOS  --   --  4.7* 4.6   GFR: Estimated Creatinine Clearance: 122.1 mL/min (A) (by C-G formula based on SCr of 1.04 mg/dL (H)). Liver Function Tests: Recent Labs  Lab 08/10/20 1938 08/12/20 0614 08/13/20 0449  AST 15 14* 12*  ALT 14 12 12   ALKPHOS 42 41 35*  BILITOT 0.6 0.8 0.9  PROT 7.2 6.8 6.8  ALBUMIN 3.6 3.5 3.5   No results for input(s): LIPASE, AMYLASE in the last 168 hours. No results for input(s): AMMONIA in the last 168 hours. Coagulation Profile: No results for input(s): INR, PROTIME  in the last 168 hours. Cardiac Enzymes: No results for input(s): CKTOTAL, CKMB, CKMBINDEX, TROPONINI in the last 168 hours. BNP (last 3 results) No results for input(s): PROBNP in the last 8760 hours. HbA1C: No results for input(s): HGBA1C in the last 72 hours. CBG: No results for input(s): GLUCAP in the last 168 hours. Lipid Profile: No results for input(s): CHOL, HDL, LDLCALC, TRIG, CHOLHDL, LDLDIRECT in the last 72 hours. Thyroid Function Tests: No results for input(s): TSH, T4TOTAL, FREET4, T3FREE, THYROIDAB in the last 72 hours. Anemia Panel: Recent Labs    08/13/20 0449  VITAMINB12 748  FOLATE 18.3  FERRITIN 70  TIBC 379  IRON 21*   Sepsis Labs: No results for input(s): PROCALCITON, LATICACIDVEN in the last 168 hours.  Recent Results (from the past 240 hour(s))  Respiratory Panel by RT PCR (Flu A&B, Covid) - Nasopharyngeal Swab     Status: None   Collection Time: 08/10/20  7:45 PM   Specimen: Nasopharyngeal Swab  Result Value Ref Range Status   SARS Coronavirus 2 by RT PCR NEGATIVE NEGATIVE Final    Comment:  (NOTE) SARS-CoV-2 target nucleic acids are NOT DETECTED.  The SARS-CoV-2 RNA is generally detectable in upper respiratoy specimens during the acute phase of infection. The lowest concentration of SARS-CoV-2 viral copies this assay can detect is 131 copies/mL. A negative result does not preclude SARS-Cov-2 infection and should not be used as the sole basis for treatment or other patient management decisions. A negative result may occur with  improper specimen collection/handling, submission of specimen other than nasopharyngeal swab, presence of viral mutation(s) within the areas targeted by this assay, and inadequate number of viral copies (<131 copies/mL). A negative result must be combined with clinical observations, patient history, and epidemiological information. The expected result is Negative.  Fact Sheet for Patients:  PinkCheek.be  Fact Sheet for Healthcare Providers:  GravelBags.it  This test is no t yet approved or cleared by the [place] FDA and  has been authorized for detection and/or diagnosis of SARS-CoV-2 by FDA under an Emergency Use Authorization (EUA). This EUA will remain  in effect (meaning this test can be used) for the duration of the COVID-19 declaration under Section 564(b)(1) of the Act, 21 U.S.C. section 360bbb-3(b)(1), unless the authorization is terminated or revoked sooner.     Influenza A by PCR NEGATIVE NEGATIVE Final   Influenza B by PCR NEGATIVE NEGATIVE Final    Comment: (NOTE) The Xpert Xpress SARS-CoV-2/FLU/RSV assay is intended as an aid in  the diagnosis of influenza from Nasopharyngeal swab specimens and  should not be used as a sole basis for treatment. Nasal washings and  aspirates are unacceptable for Xpert Xpress SARS-CoV-2/FLU/RSV  testing.  Fact Sheet for Patients: PinkCheek.be  Fact Sheet for Healthcare  Providers: GravelBags.it  This test is not yet approved or cleared by the [place] FDA and  has been authorized for detection and/or diagnosis of SARS-CoV-2 by  FDA under an Emergency Use Authorization (EUA). This EUA will remain  in effect (meaning this test can be used) for the duration of the  Covid-19 declaration under Section 564(b)(1) of the Act, 21  U.S.C. section 360bbb-3(b)(1), unless the authorization is  terminated or revoked. Performed at [facility], [place] [address] [place], [place] [address]          Radiology Studies: No results found.      Scheduled Meds: . atorvastatin  40 mg Oral q1800  . calcium-vitamin D  1  tablet Oral Q breakfast  . enoxaparin (LOVENOX) injection  100 mg Subcutaneous QHS  . famotidine  40 mg Oral BID  . folic acid  1 mg Oral Daily  . furosemide  60 mg Intravenous TID  . loratadine  10 mg Oral Daily  . losartan  100 mg Oral Daily  . mouth rinse  15 mL Mouth Rinse BID  . [START ON 08/15/2020] methotrexate  20 mg Subcutaneous Once  . metoprolol succinate  25 mg Oral Daily  . predniSONE  10 mg Oral Daily  . sodium chloride flush  3 mL Intravenous Q12H  . triamcinolone ointment  1 application Topical BID  . cyanocobalamin  1,000 mcg Oral Daily   Continuous Infusions: . sodium chloride       LOS: 4 days    Time spent:40 min    Saaya Procell, Geraldo Docker, MD [facility] Pager [phone]  If 7PM-7AM, please contact night-coverage [email or web address] Password [facility] 08/14/2020, 4:48 PM

## 2020-08-14 NOTE — Plan of Care (Signed)

## 2020-08-14 NOTE — Progress Notes (Signed)
Subjective:  Looks great dressed and sitting in window seat  Objective:  Vitals:   08/13/20 1247 08/13/20 2034 08/13/20 2130 08/14/20 0532  BP: 133/76  137/64 139/62  Pulse: 85  76 60  Resp: 18 18 18 18   Temp: 98.9 F (37.2 C)  98.4 F (36.9 C) 97.8 F (36.6 C)  TempSrc: Oral  Oral Oral  SpO2: 92%  96% 100%  Weight:    (!) 223.8 kg  Height:        Intake/Output from previous day:  Intake/Output Summary (Last 24 hours) at 08/14/2020 0914 Last data filed at 08/14/2020 0532 Gross per 24 hour  Intake 435 ml  Output 1950 ml  Net -1515 ml    Physical Exam: Affect appropriate Morbidly obese black female  HEENT: normal Neck supple with no adenopathy JVP normal no bruits no thyromegaly Lungs clear with no wheezing and good diaphragmatic motion Heart:  S1/S2 no murmur, no rub, gallop or click PMI normal Abdomen: benighn, BS positve, no tenderness, no AAA no bruit.  No HSM or HJR Distal pulses intact with no bruits Plus 3 bilateral edema Neuro non-focal Skin warm and dry No muscular weakness   Lab Results: Basic Metabolic Panel: Recent Labs    08/12/20 0614 08/13/20 0449  NA 139 141  K 3.5 4.0  CL 96* 95*  CO2 34* 34*  GLUCOSE 117* 95  BUN 18 24*  CREATININE 1.14* 1.04*  CALCIUM 8.5* 8.9  MG 1.7 1.6*  PHOS 4.7* 4.6   Liver Function Tests: Recent Labs    08/12/20 0614 08/13/20 0449  AST 14* 12*  ALT 12 12  ALKPHOS 41 35*  BILITOT 0.8 0.9  PROT 6.8 6.8  ALBUMIN 3.5 3.5   No results for input(s): LIPASE, AMYLASE in the last 72 hours. CBC: Recent Labs    08/12/20 0614 08/13/20 0824  WBC 8.6 8.3  NEUTROABS 5.9 5.7  HGB 9.0* 9.9*  HCT 34.0* 36.6  MCV 74.6* 71.9*  PLT 194 224   Cardiac Enzymes: No results for input(s): CKTOTAL, CKMB, CKMBINDEX, TROPONINI in the last 72 hours. BNP: Invalid input(s): POCBNP D-Dimer: No results for input(s): DDIMER in the last 72 hours. Hemoglobin A1C: No results for input(s): HGBA1C in the last 72  hours. Fasting Lipid Panel: No results for input(s): CHOL, HDL, LDLCALC, TRIG, CHOLHDL, LDLDIRECT in the last 72 hours. Thyroid Function Tests: No results for input(s): TSH, T4TOTAL, T3FREE, THYROIDAB in the last 72 hours.  Invalid input(s): FREET3 Anemia Panel: Recent Labs    08/13/20 0449  VITAMINB12 748  FOLATE 18.3  FERRITIN 70  TIBC 379  IRON 21*    Imaging: DG CHEST PORT 1 VIEW  Result Date: 08/12/2020 CLINICAL DATA:  Short of breath EXAM: PORTABLE CHEST 1 VIEW COMPARISON:  08/10/2020 FINDINGS: Cardiac enlargement with vascular congestion similar to prior study. No significant edema or effusion. Mild left lower lobe atelectasis. IMPRESSION: Cardiac enlargement with mild vascular congestion similar to the prior study. Mild fluid overload. Electronically Signed   By: Franchot Gallo M.D.   On: 08/12/2020 10:33    Cardiac Studies:  ECG: SR no acute changes    Telemetry:  NSR 08/14/2020   Echo: 08/11/20 EF 85-27% grade 2 diastolic dysfunction Mild RVE no valve disease   Medications:   . atorvastatin  40 mg Oral q1800  . calcium-vitamin D  1 tablet Oral Q breakfast  . enoxaparin (LOVENOX) injection  100 mg Subcutaneous QHS  . famotidine  40 mg Oral BID  .  folic acid  1 mg Oral Daily  . furosemide  60 mg Intravenous TID  . loratadine  10 mg Oral Daily  . losartan  100 mg Oral Daily  . mouth rinse  15 mL Mouth Rinse BID  . metoprolol succinate  25 mg Oral Daily  . predniSONE  10 mg Oral Daily  . sodium chloride flush  3 mL Intravenous Q12H  . triamcinolone ointment  1 application Topical BID  . cyanocobalamin  1,000 mcg Oral Daily     . sodium chloride      Assessment/Plan:   1. Diastolic CHF:  Acute on chronic. Major issues are morbid obesity, poor diet and OSA. Elevated PA pressures are WHO class 2/3 and don't think she would benefit from right heart cath as directed vasodilators only indicated for WHO class 1.  Primary goal of Rx is diuresis. 60 iv lasix bid is  fine. Continue CPAP she needs to f/u with Dr Elsworth Soho pulmonary to optimize. Weight loss does not seem to be on her agenda and she is extremely sedentary  Suspect she will need to go home on Demadex 40 bid. Would diurese until azotemic supplement K.  Note her BNP was only 34 and a lot of her weight gain is simple obesity  She diuresed over 3.5 L's so far  suspect she can be d/c over weekend  On demedex 40 mg bid  Will arrange outpatient f/u with Dr [name]  Cardiology will sign off  Jenkins Rouge 08/14/2020, 9:14 AM

## 2020-08-15 DIAGNOSIS — I10 Essential (primary) hypertension: Secondary | ICD-10-CM | POA: Diagnosis not present

## 2020-08-15 DIAGNOSIS — D649 Anemia, unspecified: Secondary | ICD-10-CM | POA: Diagnosis not present

## 2020-08-15 DIAGNOSIS — I509 Heart failure, unspecified: Secondary | ICD-10-CM | POA: Diagnosis not present

## 2020-08-15 DIAGNOSIS — I5033 Acute on chronic diastolic (congestive) heart failure: Secondary | ICD-10-CM | POA: Diagnosis not present

## 2020-08-15 LAB — COMPREHENSIVE METABOLIC PANEL
ALT: 9 U/L (ref 0–44)
AST: 30 U/L (ref 15–41)
Albumin: 3.7 g/dL (ref 3.5–5.0)
Alkaline Phosphatase: 40 U/L (ref 38–126)
Anion gap: 15 (ref 5–15)
BUN: 26 mg/dL — ABNORMAL HIGH (ref 6–20)
CO2: 35 mmol/L — ABNORMAL HIGH (ref 22–32)
Calcium: 8.7 mg/dL — ABNORMAL LOW (ref 8.9–10.3)
Chloride: 92 mmol/L — ABNORMAL LOW (ref 98–111)
Creatinine, Ser: 1.22 mg/dL — ABNORMAL HIGH (ref 0.44–1.00)
GFR, Estimated: 50 mL/min — ABNORMAL LOW (ref >60)
Glucose, Bld: 85 mg/dL (ref 70–99)
Potassium: 4.7 mmol/L (ref 3.5–5.1)
Sodium: 142 mmol/L (ref 135–145)
Total Bilirubin: 1.6 mg/dL — ABNORMAL HIGH (ref 0.3–1.2)
Total Protein: 7.1 g/dL (ref 6.5–8.1)

## 2020-08-15 LAB — CBC
HCT: 34.9 % — ABNORMAL LOW (ref 36.0–46.0)
Hemoglobin: 9.8 g/dL — ABNORMAL LOW (ref 12.0–15.0)
MCH: 19.8 pg — ABNORMAL LOW (ref 26.0–34.0)
MCHC: 28.1 g/dL — ABNORMAL LOW (ref 30.0–36.0)
MCV: 70.5 fL — ABNORMAL LOW (ref 80.0–100.0)
Platelets: 145 10*3/uL — ABNORMAL LOW (ref 150–400)
RBC: 4.95 MIL/uL (ref 3.87–5.11)
RDW: 25.2 % — ABNORMAL HIGH (ref 11.5–15.5)
WBC: 8.8 10*3/uL (ref 4.0–10.5)
nRBC: 0 % (ref 0.0–0.2)

## 2020-08-15 LAB — MAGNESIUM: Magnesium: 2.4 mg/dL (ref 1.7–2.4)

## 2020-08-15 LAB — PHOSPHORUS: Phosphorus: 4.6 mg/dL (ref 2.5–4.6)

## 2020-08-15 MED ORDER — TORSEMIDE 20 MG PO TABS
40.0000 mg | ORAL_TABLET | Freq: Every day | ORAL | Status: DC
  Filled 2020-08-15: qty 2

## 2020-08-15 MED ORDER — TORSEMIDE 20 MG PO TABS
40.0000 mg | ORAL_TABLET | Freq: Two times a day (BID) | ORAL | Status: DC
  Administered 2020-08-15 – 2020-08-16 (×2): 40 mg via ORAL
  Filled 2020-08-15 (×3): qty 2

## 2020-08-15 NOTE — Plan of Care (Signed)
No s/s of sepsis this shift. Pt had prn medication for c/o Left leg pain times 2 this shift. (see MARs). Problem: Education: Goal: Ability to demonstrate management of disease process will improve Outcome: Progressing Goal: Ability to verbalize understanding of medication therapies will improve Outcome: Progressing Goal: Individualized Educational Video(s) Outcome: Progressing   Problem: Activity: Goal: Capacity to carry out activities will improve Outcome: Progressing   Problem: Cardiac: Goal: Ability to achieve and maintain adequate cardiopulmonary perfusion will improve Outcome: Progressing   Problem: Education: Goal: Knowledge of General Education information will improve Description: Including pain rating scale, medication(s)/side effects and non-pharmacologic comfort measures Outcome: Progressing   Problem: Health Behavior/Discharge Planning: Goal: Ability to manage health-related needs will improve Outcome: Progressing   Problem: Clinical Measurements: Goal: Ability to maintain clinical measurements within normal limits will improve Outcome: Progressing Goal: Will remain free from infection Outcome: Progressing Goal: Diagnostic test results will improve Outcome: Progressing Goal: Respiratory complications will improve Outcome: Progressing Goal: Cardiovascular complication will be avoided Outcome: Progressing   Problem: Activity: Goal: Risk for activity intolerance will decrease Outcome: Progressing   Problem: Nutrition: Goal: Adequate nutrition will be maintained Outcome: Progressing   Problem: Coping: Goal: Level of anxiety will decrease Outcome: Progressing   Problem: Elimination: Goal: Will not experience complications related to bowel motility Outcome: Progressing Goal: Will not experience complications related to urinary retention Outcome: Progressing   Problem: Pain Managment: Goal: General experience of comfort will improve Outcome:  Progressing   Problem: Safety: Goal: Ability to remain free from injury will improve Outcome: Progressing   Problem: Skin Integrity: Goal: Risk for impaired skin integrity will decrease Outcome: Progressing

## 2020-08-15 NOTE — Progress Notes (Signed)
PROGRESS NOTE    [name]  URK:[id number] DOB: Nov 30, 1965 DOA: 08/10/2020 PCP: [name], NP     Brief Narrative:  [name] a 54 y.o.BF PMHx Chronic Diastolic CHF, iron deficiency anemia, chronic respiratory failure with hypoxia/COPD on 2 L O2, sarcoidosis, OSA/OHS, morbidly obese, GERD, hypothyroidism and hypertension   who presented to the ER with worsening shortness of breath and increased weight in the last month. She has gained about 15 pounds. Patient normally is on 2 L of oxygen at home and uses CPAP at night. She was seen in the ER with slight increase in oxygen requirement. She has history of COPD but no wheezing. She is currently requiring 3 L of oxygen. She is also notably having significant edema. Patient has been taking oral Bumex at home but has not been having significant response. She is being admitted for IV diuretics and reevaluation. She had her last echocardiogram a year ago where the EF was 55 to 60%. She denied any chest pain. Denied any recent cardiac work-up. She has sarcoidosis and is on chronic prednisone..  ED Course:Temperature 98 blood pressure 170/75 pulse 111 respirate of 38 oxygen sats 93% on 3 L. White count is 9.0 hemoglobin 9.1 platelets 191. Sodium 143 potassium 4.0 chloride 102 CO2 34 BUN 16 creatinine 0.90 calcium 8.8. Respiratory panel is negative. Fecal occult blood testing negative x2. PT/INR 14 and 1.06. Chest x-ray showed cardiomegaly with pulmonary vascular and trace fluid in the right effusion acute interstitial edema. Patient being admitted for evaluation and treatment   Subjective: 10/16 afebrile overnight A/O x4, positive S OB, acute on chronic.    Assessment & Plan: Covid vaccination; vaccinated   Principal Problem:   Acute on chronic diastolic (congestive) heart failure (HCC) Active Problems:   OSA (obstructive sleep apnea)   Obesity hypoventilation syndrome (HCC)   Hypertension    Sarcoidosis   Iron deficiency anemia due to chronic blood loss   Hypoxemia   GERD (gastroesophageal reflux disease)   Chronic Diastolic CHF  Right Sided Heart Failure  Pulmonary Hypertension: EF 55-60%, grade II diastolic dysfunction, RV pressure overload, mildly reduced RV systolic function, moderately elevated PASP (see report) -10/14 increase Lasix IV 60 mg  TID (she's on 40 mg of Torsemide at home daily) -Cardiology c/s, recommending continuing diuresis -> elevated PA pressures WHO 2/3, no recommendation for [facility] cath, continue cpap  -Strict I&O -9.8 L -Daily weight Filed Weights   08/13/20 0618 08/14/20 0532 08/15/20 0202  Weight: (!) 224.5 kg (!) 223.8 kg (!) 218.2 kg  -Losartan 100 mg daily -Toprol 25 mg daily -10/16 DC IV Lasix start Torsemide 40 mg BID -Follow-up with Dr. Candee Furbish in 2 weeks chronic diastolic CHF/pulmonary HTN/right-sided heart failure  Essential HTN -See CHF  OSA/OHS  -CPAP per respiratory  GERD: -Continue PPIs.  Sarcoidosis -Prednisone 10 mg daily -10/15 in a.m. discuss who patient sees for her sarcoidosis.   -Continue Methotrexate 20 mg q. Friday  iron deficiency anemia:  Lab Results  Component Value Date   HGB 9.8 (L) 08/15/2020   HGB 9.8 (L) 08/14/2020   HGB 9.9 (L) 08/13/2020   HGB 9.0 (L) 08/12/2020   HGB 8.9 (L) 08/11/2020  -Stable  Hypomagnesmia -Magnesium goal> 2 -Magnesium IV 3 g     DVT prophylaxis: Lovenox Code Status: Full Family Communication:  Status is: Inpatient    Dispo: The patient is from: Home              Anticipated d/c  is to: Home              Anticipated d/c date is: 10/17              Patient currently unstable      Consultants:    Procedures/Significant Events:    I have personally reviewed and interpreted all radiology studies and my findings are as above.  VENTILATOR SETTINGS: Nasal cannula 10/ 16 Flow 2 L/min SPO2  100%   Cultures   Antimicrobials:    Devices    LINES / TUBES:      Continuous Infusions: . sodium chloride       Objective: Vitals:   08/14/20 2324 08/15/20 0202 08/15/20 0527 08/15/20 1312  BP:   (!) 133/57 (!) 138/57  Pulse:   68 73  Resp: 17  18 18   Temp:   (!) 97.4 F (36.3 C) 97.9 F (36.6 C)  TempSrc:    Oral  SpO2:   100% 92%  Weight:  (!) 218.2 kg    Height:        Intake/Output Summary (Last 24 hours) at 08/15/2020 1410 Last data filed at 08/15/2020 1313 Gross per 24 hour  Intake 480 ml  Output 2901 ml  Net -2421 ml   Filed Weights   08/13/20 0618 08/14/20 0532 08/15/20 0202  Weight: (!) 224.5 kg (!) 223.8 kg (!) 218.2 kg    Examination:  General: A/O x4, positive chronic respiratory distress Eyes: negative scleral hemorrhage, negative anisocoria, negative icterus ENT: Negative Runny nose, negative gingival bleeding, Neck:  Negative scars, masses, torticollis, lymphadenopathy, JVD Lungs: Clear to auscultation bilaterally without wheezes or crackles Cardiovascular: Regular rate and rhythm without murmur gallop or rub normal S1 and S2 Abdomen: MORBIDLY OBESE, negative abdominal pain, nondistended, positive soft, bowel sounds, no rebound, no ascites, no appreciable mass Extremities: anasarca Skin: Negative rashes, lesions, ulcers Psychiatric:  Negative depression, negative anxiety, negative fatigue, negative mania  Central nervous system:  Cranial nerves II through XII intact, tongue/uvula midline, all extremities muscle strength 5/5, sensation intact throughout, negative dysarthria, negative expressive aphasia, negative receptive aphasia.  .     Data Reviewed: Care during the described time interval was provided by me .  I have reviewed this patient's available data, including medical history, events of note, physical examination, and all test results as part of my evaluation.  CBC: Recent Labs  Lab 08/10/20 1938 08/10/20 1938  08/11/20 0437 08/12/20 0614 08/13/20 0824 08/14/20 1812 08/15/20 0534  WBC 9.0   < > 7.8 8.6 8.3 11.0* 8.8  NEUTROABS 6.8  --   --  5.9 5.7  --   --   HGB 9.2*   < > 8.9* 9.0* 9.9* 9.8* 9.8*  HCT 34.3*   < > 33.7* 34.0* 36.6 35.8* 34.9*  MCV 73.6*   < > 74.4* 74.6* 71.9* 71.7* 70.5*  PLT 191   < > 191 194 224 185 145*   < > = values in this interval not displayed.   Basic Metabolic Panel: Recent Labs  Lab 08/11/20 0437 08/12/20 0614 08/13/20 0449 08/14/20 1812 08/15/20 0534  NA 141 139 141 141 142  K 3.9 3.5 4.0 4.0 4.7  CL 99 96* 95* 91* 92*  CO2 31 34* 34* 35* 35*  GLUCOSE 98 117* 95 106* 85  BUN 14 18 24* 28* 26*  CREATININE 0.91 1.14* 1.04* 1.18* 1.22*  CALCIUM 8.7* 8.5* 8.9 9.1 8.7*  MG  --  1.7 1.6* 1.6* 2.4  PHOS  --  4.7* 4.6 4.5 4.6   GFR: Estimated Creatinine Clearance: 102.3 mL/min (A) (by C-G formula based on SCr of 1.22 mg/dL (H)). Liver Function Tests: Recent Labs  Lab 08/10/20 1938 08/12/20 0614 08/13/20 0449 08/14/20 1812 08/15/20 0534  AST 15 14* 12* 14* 30  ALT 14 12 12 13 9   ALKPHOS 42 41 35* 45 40  BILITOT 0.6 0.8 0.9 0.9 1.6*  PROT 7.2 6.8 6.8 7.7 7.1  ALBUMIN 3.6 3.5 3.5 4.1 3.7   No results for input(s): LIPASE, AMYLASE in the last 168 hours. No results for input(s): AMMONIA in the last 168 hours. Coagulation Profile: No results for input(s): INR, PROTIME in the last 168 hours. Cardiac Enzymes: No results for input(s): CKTOTAL, CKMB, CKMBINDEX, TROPONINI in the last 168 hours. BNP (last 3 results) No results for input(s): PROBNP in the last 8760 hours. HbA1C: No results for input(s): HGBA1C in the last 72 hours. CBG: No results for input(s): GLUCAP in the last 168 hours. Lipid Profile: No results for input(s): CHOL, HDL, LDLCALC, TRIG, CHOLHDL, LDLDIRECT in the last 72 hours. Thyroid Function Tests: No results for input(s): TSH, T4TOTAL, FREET4, T3FREE, THYROIDAB in the last 72 hours. Anemia Panel: Recent Labs     08/13/20 0449  VITAMINB12 748  FOLATE 18.3  FERRITIN 70  TIBC 379  IRON 21*   Sepsis Labs: No results for input(s): PROCALCITON, LATICACIDVEN in the last 168 hours.  Recent Results (from the past 240 hour(s))  Respiratory Panel by RT PCR (Flu A&B, Covid) - Nasopharyngeal Swab     Status: None   Collection Time: 08/10/20  7:45 PM   Specimen: Nasopharyngeal Swab  Result Value Ref Range Status   SARS Coronavirus 2 by RT PCR NEGATIVE NEGATIVE Final    Comment: (NOTE) SARS-CoV-2 target nucleic acids are NOT DETECTED.  The SARS-CoV-2 RNA is generally detectable in upper respiratoy specimens during the acute phase of infection. The lowest concentration of SARS-CoV-2 viral copies this assay can detect is 131 copies/mL. A negative result does not preclude SARS-Cov-2 infection and should not be used as the sole basis for treatment or other patient management decisions. A negative result may occur with  improper specimen collection/handling, submission of specimen other than nasopharyngeal swab, presence of viral mutation(s) within the areas targeted by this assay, and inadequate number of viral copies (<131 copies/mL). A negative result must be combined with clinical observations, patient history, and epidemiological information. The expected result is Negative.  Fact Sheet for Patients:  PinkCheek.be  Fact Sheet for Healthcare Providers:  GravelBags.it  This test is no t yet approved or cleared by the [place] FDA and  has been authorized for detection and/or diagnosis of SARS-CoV-2 by FDA under an Emergency Use Authorization (EUA). This EUA will remain  in effect (meaning this test can be used) for the duration of the COVID-19 declaration under Section 564(b)(1) of the Act, 21 U.S.C. section 360bbb-3(b)(1), unless the authorization is terminated or revoked sooner.     Influenza A by PCR NEGATIVE NEGATIVE Final    Influenza B by PCR NEGATIVE NEGATIVE Final    Comment: (NOTE) The Xpert Xpress SARS-CoV-2/FLU/RSV assay is intended as an aid in  the diagnosis of influenza from Nasopharyngeal swab specimens and  should not be used as a sole basis for treatment. Nasal washings and  aspirates are unacceptable for Xpert Xpress SARS-CoV-2/FLU/RSV  testing.  Fact Sheet for Patients: PinkCheek.be  Fact Sheet for Healthcare Providers: GravelBags.it  This test is not yet approved or  cleared by the [place] and  has been authorized for detection and/or diagnosis of SARS-CoV-2 by  FDA under an Emergency Use Authorization (EUA). This EUA will remain  in effect (meaning this test can be used) for the duration of the  Covid-19 declaration under Section 564(b)(1) of the Act, 21  U.S.C. section 360bbb-3(b)(1), unless the authorization is  terminated or revoked. Performed at [facility], [place] [address] [place], [place] [address]          Radiology Studies: No results found.      Scheduled Meds: . atorvastatin  40 mg Oral q1800  . calcium-vitamin D  1 tablet Oral Q breakfast  . enoxaparin (LOVENOX) injection  100 mg Subcutaneous QHS  . famotidine  40 mg Oral BID  . folic acid  1 mg Oral Daily  . furosemide  60 mg Intravenous TID  . loratadine  10 mg Oral Daily  . losartan  100 mg Oral Daily  . mouth rinse  15 mL Mouth Rinse BID  . methotrexate  20 mg Subcutaneous Once  . metoprolol succinate  25 mg Oral Daily  . predniSONE  10 mg Oral Daily  . sodium chloride flush  3 mL Intravenous Q12H  . triamcinolone ointment  1 application Topical BID  . cyanocobalamin  1,000 mcg Oral Daily   Continuous Infusions: . sodium chloride       LOS: 5 days    Time spent:40 min    Arisbeth Purrington, Geraldo Docker, MD [facility] Pager [phone]  If 7PM-7AM, please contact night-coverage [email or web address] Password  [email or web address] 08/15/2020, 2:10 PM

## 2020-08-16 DIAGNOSIS — J9611 Chronic respiratory failure with hypoxia: Secondary | ICD-10-CM | POA: Diagnosis not present

## 2020-08-16 DIAGNOSIS — I509 Heart failure, unspecified: Secondary | ICD-10-CM | POA: Diagnosis not present

## 2020-08-16 DIAGNOSIS — D649 Anemia, unspecified: Secondary | ICD-10-CM | POA: Diagnosis not present

## 2020-08-16 DIAGNOSIS — I272 Pulmonary hypertension, unspecified: Secondary | ICD-10-CM

## 2020-08-16 DIAGNOSIS — I5033 Acute on chronic diastolic (congestive) heart failure: Secondary | ICD-10-CM | POA: Diagnosis not present

## 2020-08-16 DIAGNOSIS — Z9989 Dependence on other enabling machines and devices: Secondary | ICD-10-CM

## 2020-08-16 DIAGNOSIS — D860 Sarcoidosis of lung: Secondary | ICD-10-CM | POA: Diagnosis present

## 2020-08-16 LAB — CBC
HCT: 39.1 % (ref 36.0–46.0)
Hemoglobin: 10.3 g/dL — ABNORMAL LOW (ref 12.0–15.0)
MCH: 19.6 pg — ABNORMAL LOW (ref 26.0–34.0)
MCHC: 26.3 g/dL — ABNORMAL LOW (ref 30.0–36.0)
MCV: 74.5 fL — ABNORMAL LOW (ref 80.0–100.0)
Platelets: 209 10*3/uL (ref 150–400)
RBC: 5.25 MIL/uL — ABNORMAL HIGH (ref 3.87–5.11)
RDW: 25.7 % — ABNORMAL HIGH (ref 11.5–15.5)
WBC: 10.6 10*3/uL — ABNORMAL HIGH (ref 4.0–10.5)
nRBC: 0.3 % — ABNORMAL HIGH (ref 0.0–0.2)

## 2020-08-16 LAB — COMPREHENSIVE METABOLIC PANEL
ALT: 13 U/L (ref 0–44)
AST: 20 U/L (ref 15–41)
Albumin: 4.4 g/dL (ref 3.5–5.0)
Alkaline Phosphatase: 46 U/L (ref 38–126)
Anion gap: 18 — ABNORMAL HIGH (ref 5–15)
BUN: 27 mg/dL — ABNORMAL HIGH (ref 6–20)
CO2: 34 mmol/L — ABNORMAL HIGH (ref 22–32)
Calcium: 9.5 mg/dL (ref 8.9–10.3)
Chloride: 90 mmol/L — ABNORMAL LOW (ref 98–111)
Creatinine, Ser: 1.3 mg/dL — ABNORMAL HIGH (ref 0.44–1.00)
GFR, Estimated: 46 mL/min — ABNORMAL LOW (ref >60)
Glucose, Bld: 99 mg/dL (ref 70–99)
Potassium: 3.8 mmol/L (ref 3.5–5.1)
Sodium: 142 mmol/L (ref 135–145)
Total Bilirubin: 1 mg/dL (ref 0.3–1.2)
Total Protein: 8.2 g/dL — ABNORMAL HIGH (ref 6.5–8.1)

## 2020-08-16 LAB — PHOSPHORUS: Phosphorus: 4.1 mg/dL (ref 2.5–4.6)

## 2020-08-16 LAB — MAGNESIUM: Magnesium: 2 mg/dL (ref 1.7–2.4)

## 2020-08-16 MED ORDER — TORSEMIDE 20 MG PO TABS: 40.0000 mg | ORAL_TABLET | Freq: Every day | ORAL | Status: DC

## 2020-08-16 MED ORDER — TORSEMIDE 20 MG PO TABS: 20.0000 mg | ORAL_TABLET | ORAL | Status: DC

## 2020-08-16 NOTE — Discharge Summary (Signed)
Physician Discharge Summary  KAITLAN BIN GBT:[id number] DOB: [birth year] DOA: 08/10/2020  PCP: [name], NP  Admit date: 08/10/2020 Discharge date: 08/16/2020  Time spent: 35 minutes  Recommendations for Outpatient Follow-up:   Covid vaccination; vaccinated  Acute on Chronic Diastolic CHF  Right Sided Heart Failure  Pulmonary Hypertension: EF 55-60%, grade II diastolic dysfunction, RV pressure overload, mildly reduced RV systolic function, moderately elevated PASP (see report) -10/14 increase Lasix IV 60 mg  TID (she's on 40 mg of Torsemide at home daily) -Cardiology c/s, recommending continuing diuresis ->elevated PA pressures WHO 2/3, no recommendation for [facility] cath, continue cpap  -Strict I&O  -11.9 L -Daily weight Filed Weights   08/14/20 0532 08/15/20 0202 08/16/20 0743  Weight: (!) 223.8 kg (!) 218.2 kg (!) 221.2 kg  -Losartan 100 mg daily -Toprol 25 mg daily -10/16 DC IV Lasix start Torsemide 40 mg  -Follow-up with Dr. Candee Furbish in 2 weeks chronic diastolic CHF/pulmonary HTN/right-sided heart failure.  Dr. [name] will adjust diuretic.  Essential HTN -See CHF  OSA/OHS  -CPAP per respiratory  Chronic respiratory failure with hypoxia Patient Saturations on Room Air at Rest = 88 % Patient Saturations on Room Air while Ambulating = 82 % Patient Saturations on 2 Liters of oxygen while Ambulating = 94-98% Please briefly explain why patient needs home oxygen: Patient ambulated 16 feet, saturation decreased 82%. Pt experienced shortness of breath with activity. Saturation increased with 2 lt [place] to 94% -Patient meets criteria for home O2 -2 L O2 titrate to maintain SPO2> 88% -Provide [employer] portable home O2 concentrator   Sarcoidosis of the lung -Prednisone 10 mg daily -10/15 in a.m. discuss who patient sees for her sarcoidosis.   -Continue Methotrexate 20 mg q. Friday  GERD: -Continue PPIs.  iron deficiency anemia: Lab Results  Component  Value Date   HGB 10.3 (L) 08/16/2020   HGB 9.8 (L) 08/15/2020   HGB 9.8 (L) 08/14/2020   HGB 9.9 (L) 08/13/2020   HGB 9.0 (L) 08/12/2020  -Stable  Hypomagnesmia -Magnesium goal> 2    Discharge Diagnoses:  Principal Problem:   Acute on chronic diastolic (congestive) heart failure (HCC) Active Problems:   OSA (obstructive sleep apnea)   Obesity hypoventilation syndrome (HCC)   Hypertension   Sarcoidosis   Iron deficiency anemia due to chronic blood loss   Hypoxemia   GERD (gastroesophageal reflux disease)   Chronic respiratory failure with hypoxia (HCC)   Sarcoidosis of lung ([place])   Discharge Condition: Stable  Diet recommendation: Heart healthy  Filed Weights   08/14/20 0532 08/15/20 0202 08/16/20 0743  Weight: (!) 223.8 kg (!) 218.2 kg (!) 221.2 kg    History of present illness:  [name] a 54 y.o.BF PMHx Chronic Diastolic CHF, iron deficiency anemia, chronic respiratory failure with hypoxia/COPD on 2 L O2, sarcoidosis, OSA/OHS, morbidly obese, GERD, hypothyroidism and hypertension   who presented to the ER with worsening shortness of breath and increased weight in the last month. She has gained about 15 pounds. Patient normally is on 2 L of oxygen at home and uses CPAP at night. She was seen in the ER with slight increase in oxygen requirement. She has history of COPD but no wheezing. She is currently requiring 3 L of oxygen. She is also notably having significant edema. Patient has been taking oral Bumex at home but has not been having significant response. She is being admitted for IV diuretics and reevaluation. She had her last echocardiogram a year ago where  the EF was 55 to 60%. She denied any chest pain. Denied any recent cardiac work-up. She has sarcoidosis and is on chronic prednisone..  ED Course:Temperature 98 blood pressure 170/75 pulse 111 respirate of 38 oxygen sats 93% on 3 L. White count is 9.0 hemoglobin 9.1 platelets 191.  Sodium 143 potassium 4.0 chloride 102 CO2 34 BUN 16 creatinine 0.90 calcium 8.8. Respiratory panel is negative. Fecal occult blood testing negative x2. PT/INR 14 and 1.06. Chest x-ray showed cardiomegaly with pulmonary vascular and trace fluid in the right effusion acute interstitial edema. Patient being admitted for evaluation and treatment   Hospital Course:  See above    Ventilator settings Nasal cannula 10/17 Flow 2 L/min SPO2 93%   Procedures: 10/12 Echocardiogram;LVEF= 55 to 60%. -interventricular septum is flattened in systole, consistent with right ventricular pressure overload.  -Grade II diastolic dysfunction (pseudonormalization). -Moderately elevated pulmonary artery systolic pressure. estimated right ventricular systolic pressure is  63.1 mmHg.    Consultations: Cardiology    Cultures   10/11 SARS coronavirus negative 10/11 influenza A/B negative  10/12 HIV screen negative   Antibiotics Anti-infectives (From admission, onward)   None       Discharge Exam: Vitals:   08/15/20 2144 08/16/20 0451 08/16/20 0743 08/16/20 0834  BP: (!) 146/60 (!) 143/67  (!) 145/66  Pulse: 70 74  73  Resp: 18 18  18   Temp: 98 F (36.7 C) 98.3 F (36.8 C)    TempSrc: Oral Oral    SpO2: 95% 93%  93%  Weight:   (!) 221.2 kg   Height:        General: A/O x4, positive chronic respiratory distress Eyes: negative scleral hemorrhage, negative anisocoria, negative icterus ENT: Negative Runny nose, negative gingival bleeding, Neck:  Negative scars, masses, torticollis, lymphadenopathy, JVD Lungs: Clear to auscultation bilaterally without wheezes or crackles Cardiovascular: Regular rate and rhythm without murmur gallop or rub normal S1 and S2 Abdomen: MORBIDLY OBESE, negative abdominal pain, nondistended, positive soft, bowel sounds, no rebound, no ascites, no appreciable mass   Discharge Instructions   Allergies as of 08/16/2020      Reactions   Tape Itching    Leads cause Skin burns and irritation after 24 hours+      Medication List    TAKE these medications   albuterol 108 (90 Base) MCG/ACT inhaler Commonly known as: ProAir HFA Inhale 1-2 puffs into the lungs every 6 (six) hours as needed for wheezing or shortness of breath.   atorvastatin 40 MG tablet Commonly known as: LIPITOR TAKE 1 TABLET (40 MG TOTAL) BY MOUTH DAILY AT 6 PM.   B-D TB SYRINGE 1CC/27GX1/2" 27G X 1/2" 1 ML Misc Generic drug: TUBERCULIN SYR 1CC/27GX1/2" USE ONCE A WEEK TO INJECT METHOTREXATE   calcium citrate-vitamin D 315-200 MG-UNIT tablet Commonly known as: CITRACAL+D Take 1 tablet by mouth daily.   cetirizine 10 MG tablet Commonly known as: ZYRTEC Take 10 mg by mouth at bedtime as needed for allergies.   cyanocobalamin 1000 MCG tablet Take 1 tablet (1,000 mcg total) by mouth daily.   famotidine 40 MG tablet Commonly known as: PEPCID Take 1 tablet (40 mg total) by mouth 2 (two) times daily.   folic acid 1 MG tablet Commonly known as: FOLVITE Take 1 mg by mouth daily.   HYDROcodone-acetaminophen 10-325 MG tablet Commonly known as: NORCO Take 1 tablet by mouth every 6 (six) hours as needed (pain).   Kenalog 40 MG/ML injection Generic drug: triamcinolone acetonide Inject 40 mg  into the muscle every 3 (three) months. For knee pain   losartan 100 MG tablet Commonly known as: COZAAR TAKE 1 TABLET (100 MG TOTAL) DAILY BY MOUTH.   methotrexate 50 MG/2ML injection Inject 20 mg into the vein once a week. Dose is 0.8 mL (20mg ). Take on Fridays   metoprolol succinate 25 MG 24 hr tablet Commonly known as: TOPROL-XL TAKE 1 TABLET BY MOUTH EVERY DAY   NON FORMULARY 2 liter of oxygen   predniSONE 10 MG tablet Commonly known as: DELTASONE Take 1 tablet (10 mg total) by mouth daily.   torsemide 20 MG tablet Commonly known as: DEMADEX Take 2 tablets (40 mg total) by mouth daily. Take additional 20 mg for weight gain of 3 lbs in 1 day or 5 lbs in 2 days  or swelling or shortness of breath.   triamcinolone ointment 0.1 % Commonly known as: KENALOG Apply 1 application topically 2 (two) times daily. For Sarcoidosis            Durable Medical Equipment  (From admission, onward)         Start     Ordered   08/16/20 1046  For home use only DME oxygen  Once       Comments: Patient Saturations on Room Air at Rest = 88 % Patient Saturations on Room Air while Ambulating = 82 % Patient Saturations on 2 Liters of oxygen while Ambulating = 94-98% Please briefly explain why patient needs home oxygen: Patient ambulated 16 feet, saturation decreased 82%. Pt experienced shortness of breath with activity. Saturation increased with 2 lt [place] to 94% -Patient meets criteria for home O2 -2 L O2 titrate to maintain SPO2> 88% -Provide [employer]ogen portable home O2 concentrator  Question Answer Comment  Length of Need Lifetime   Mode or (Route) Nasal cannula   Liters per Minute 2   Frequency Continuous (stationary and portable oxygen unit needed)   Oxygen conserving device Yes   Oxygen delivery system Gas      08/16/20 1046         Allergies  Allergen Reactions  . Tape Itching    Leads cause Skin burns and irritation after 24 hours+    Follow-up Information    Jerline Pain, MD. Schedule an appointment as soon as possible for a visit in 2 week(s).   Specialty: Cardiology Why: Follow-up with Dr. Candee Furbish in 2 weeks chronic diastolic CHF/pulmonary HTN/right-sided heart failure Contact information: [address] [place] [address] [phone]                The results of significant diagnostics from this hospitalization (including imaging, microbiology, ancillary and laboratory) are listed below for reference.    Significant Diagnostic Studies: DG Chest 2 View  Result Date: 08/10/2020 CLINICAL DATA:  54 year old female with increasing shortness of breath for 2 weeks. Fluid retention in the extremities. EXAM:  CHEST - 2 VIEW COMPARISON:  Chest CTA 08/16/2019 and earlier. FINDINGS: AP semi upright and lateral views. Stable cardiomegaly and mediastinal contours. Visualized tracheal air column is within normal limits. No pneumothorax or layering pleural effusion. Pulmonary interstitial opacity/indistinctness of pulmonary vasculature appears unchanged from last year. There is trace pleural fluid in the right minor fissure. No consolidation. No acute osseous abnormality identified. Paucity bowel gas in the upper abdomen. IMPRESSION: Cardiomegaly with indistinct pulmonary vasculature and trace fluid in the right minor fissure. Suspect acute interstitial edema. Electronically Signed   By: Herminio Heads.D.  On: 08/10/2020 19:01   DG CHEST PORT 1 VIEW  Result Date: 08/12/2020 CLINICAL DATA:  Short of breath EXAM: PORTABLE CHEST 1 VIEW COMPARISON:  08/10/2020 FINDINGS: Cardiac enlargement with vascular congestion similar to prior study. No significant edema or effusion. Mild left lower lobe atelectasis. IMPRESSION: Cardiac enlargement with mild vascular congestion similar to the prior study. Mild fluid overload. Electronically Signed   By: Franchot Gallo M.D.   On: 08/12/2020 10:33   ECHOCARDIOGRAM COMPLETE  Result Date: 08/11/2020    ECHOCARDIOGRAM REPORT   Patient Name:   [name] Date of Exam: 08/11/2020 Medical Rec #:  [id number]             Height:       66.0 in Accession #:    [id number]            Weight:       505.0 lb Date of Birth:  April 02, 1966             BSA:          2.964 m Patient Age:    63 years              BP:           139/71 mmHg Patient Gender: F                     HR:           81 bpm. Exam Location:  Inpatient Procedure: 2D Echo and Intracardiac Opacification Agent Indications:    CHF-Acute Diastolic 865.78 / I69.62  History:        Patient has prior history of Echocardiogram examinations, most                 recent 08/17/2019. CHF, COPD, Signs/Symptoms:Shortness of                  Breath; Risk Factors:Former Smoker, Sleep Apnea and                 Hypertension. Iron deficiency anemia, GERD, Sarcoidosis,                 Hypothyroidsm.  Sonographer:    Darlina Sicilian RDCS Referring Phys: [phone] [facility] Julious Oka  Sonographer Comments: Image acquisition challenging due to patient body habitus. IMPRESSIONS  1. Left ventricular ejection fraction, by estimation, is 55 to 60%. The left ventricle has normal function. The left ventricle has no regional wall motion abnormalities. There is mild concentric left ventricular hypertrophy. Left ventricular diastolic parameters are consistent with Grade II diastolic dysfunction (pseudonormalization). There is the interventricular septum is flattened in systole, consistent with right ventricular pressure overload.  2. Right ventricular systolic function is mildly reduced. The right ventricular size is mildly enlarged. There is moderately elevated pulmonary artery systolic pressure.  3. The mitral valve is normal in structure. No evidence of mitral valve regurgitation.  4. The aortic valve is normal in structure. Aortic valve regurgitation is not visualized. Comparison(s): A prior study was performed on 08/17/2019. Prior images reviewed side by side. The right ventricular hypertrophy is worse. There is also more prominent systolic flattening of the ventricular septum and worsened pulmonary artery hypertension and increased right atrial pressure. There is also prominent respiratory septal displacement; in the absence of other signs of constrictive physiology aor pericardial tamponade, this is likely due to markedly increased work of breathing (e.g. asthma/COPD exacerbation, severe obesity, etc.). FINDINGS  Left Ventricle: Left ventricular ejection fraction,  by estimation, is 55 to 60%. The left ventricle has normal function. The left ventricle has no regional wall motion abnormalities. Definity contrast agent was given IV to delineate the left ventricular   endocardial borders. The left ventricular internal cavity size was normal in size. There is mild concentric left ventricular hypertrophy. The interventricular septum is flattened in systole, consistent with right ventricular pressure overload. Left ventricular diastolic parameters are consistent with Grade II diastolic dysfunction (pseudonormalization). Indeterminate filling pressures. Right Ventricle: The right ventricular size is mildly enlarged. No increase in right ventricular wall thickness. Right ventricular systolic function is mildly reduced. There is moderately elevated pulmonary artery systolic pressure. The tricuspid regurgitant velocity is 3.14 m/s, and with an assumed right atrial pressure of 15 mmHg, the estimated right ventricular systolic pressure is 06.2 mmHg. Left Atrium: Left atrial size was normal in size. Right Atrium: Right atrial size was not well visualized. Pericardium: There is no evidence of pericardial effusion. Mitral Valve: The mitral valve is normal in structure. No evidence of mitral valve regurgitation. Tricuspid Valve: The tricuspid valve is grossly normal. Tricuspid valve regurgitation is trivial. Aortic Valve: The aortic valve is normal in structure. Aortic valve regurgitation is not visualized. Pulmonic Valve: The pulmonic valve was not well visualized. Pulmonic valve regurgitation is not visualized. Aorta: The aortic root and ascending aorta are structurally normal, with no evidence of dilitation. IAS/Shunts: The interatrial septum was not well visualized.  LEFT VENTRICLE PLAX 2D LVIDd:         5.00 cm  Diastology LVIDs:         3.15 cm  LV e' medial:    6.74 cm/s LV PW:         1.15 cm  LV E/e' medial:  10.9 LV IVS:        1.30 cm  LV e' lateral:   5.00 cm/s LVOT diam:     2.00 cm  LV E/e' lateral: 14.7 LV SV:         47 LV SV Index:   16 LVOT Area:     3.14 cm  LEFT ATRIUM           Index LA diam:      3.65 cm 1.23 cm/m LA Vol (A4C): 39.7 ml 13.39 ml/m  AORTIC VALVE LVOT  Vmax:   77.00 cm/s LVOT Vmean:  53.500 cm/s LVOT VTI:    0.149 m  AORTA Ao Root diam: 2.70 cm MITRAL VALVE               TRICUSPID VALVE MV Area (PHT): 5.27 cm    TR Peak grad:   39.4 mmHg MV Decel Time: 144 msec    TR Vmax:        314.00 cm/s MV E velocity: 73.70 cm/s MV A velocity: 46.70 cm/s  SHUNTS MV E/A ratio:  1.58        Systemic VTI:  0.15 m                            Systemic Diam: 2.00 cm Dani Gobble Croitoru MD Electronically signed by Sanda Klein MD Signature Date/Time: 08/11/2020/8:45:05 AM    Final     Microbiology: Recent Results (from the past 240 hour(s))  Respiratory Panel by RT PCR (Flu A&B, Covid) - Nasopharyngeal Swab     Status: None   Collection Time: 08/10/20  7:45 PM   Specimen: Nasopharyngeal Swab  Result Value Ref Range Status   SARS  Coronavirus 2 by RT PCR NEGATIVE NEGATIVE Final    Comment: (NOTE) SARS-CoV-2 target nucleic acids are NOT DETECTED.  The SARS-CoV-2 RNA is generally detectable in upper respiratoy specimens during the acute phase of infection. The lowest concentration of SARS-CoV-2 viral copies this assay can detect is 131 copies/mL. A negative result does not preclude SARS-Cov-2 infection and should not be used as the sole basis for treatment or other patient management decisions. A negative result may occur with  improper specimen collection/handling, submission of specimen other than nasopharyngeal swab, presence of viral mutation(s) within the areas targeted by this assay, and inadequate number of viral copies (<131 copies/mL). A negative result must be combined with clinical observations, patient history, and epidemiological information. The expected result is Negative.  Fact Sheet for Patients:  PinkCheek.be  Fact Sheet for Healthcare Providers:  GravelBags.it  This test is no t yet approved or cleared by the [place] FDA and  has been authorized for detection and/or diagnosis  of SARS-CoV-2 by FDA under an Emergency Use Authorization (EUA). This EUA will remain  in effect (meaning this test can be used) for the duration of the COVID-19 declaration under Section 564(b)(1) of the Act, 21 U.S.C. section 360bbb-3(b)(1), unless the authorization is terminated or revoked sooner.     Influenza A by PCR NEGATIVE NEGATIVE Final   Influenza B by PCR NEGATIVE NEGATIVE Final    Comment: (NOTE) The Xpert Xpress SARS-CoV-2/FLU/RSV assay is intended as an aid in  the diagnosis of influenza from Nasopharyngeal swab specimens and  should not be used as a sole basis for treatment. Nasal washings and  aspirates are unacceptable for Xpert Xpress SARS-CoV-2/FLU/RSV  testing.  Fact Sheet for Patients: PinkCheek.be  Fact Sheet for Healthcare Providers: GravelBags.it  This test is not yet approved or cleared by the [place] FDA and  has been authorized for detection and/or diagnosis of SARS-CoV-2 by  FDA under an Emergency Use Authorization (EUA). This EUA will remain  in effect (meaning this test can be used) for the duration of the  Covid-19 declaration under Section 564(b)(1) of the Act, 21  U.S.C. section 360bbb-3(b)(1), unless the authorization is  terminated or revoked. Performed at [facility], [place] [address] [place], [place] [address]      Labs: Basic Metabolic Panel: Recent Labs  Lab 08/12/20 0614 08/13/20 0449 08/14/20 1812 08/15/20 0534 08/16/20 0447  NA 139 141 141 142 142  K 3.5 4.0 4.0 4.7 3.8  CL 96* 95* 91* 92* 90*  CO2 34* 34* 35* 35* 34*  GLUCOSE 117* 95 106* 85 99  BUN 18 24* 28* 26* 27*  CREATININE 1.14* 1.04* 1.18* 1.22* 1.30*  CALCIUM 8.5* 8.9 9.1 8.7* 9.5  MG 1.7 1.6* 1.6* 2.4 2.0  PHOS 4.7* 4.6 4.5 4.6 4.1   Liver Function Tests: Recent Labs  Lab 08/12/20 0614 08/13/20 0449 08/14/20 1812 08/15/20 0534 08/16/20 0447  AST 14* 12* 14* 30 20  ALT  12 12 13 9 13   ALKPHOS 41 35* 45 40 46  BILITOT 0.8 0.9 0.9 1.6* 1.0  PROT 6.8 6.8 7.7 7.1 8.2*  ALBUMIN 3.5 3.5 4.1 3.7 4.4   No results for input(s): LIPASE, AMYLASE in the last 168 hours. No results for input(s): AMMONIA in the last 168 hours. CBC: Recent Labs  Lab 08/10/20 1938 08/11/20 0437 08/12/20 0614 08/13/20 0824 08/14/20 1812 08/15/20 0534 08/16/20 0447  WBC 9.0   < > 8.6 8.3 11.0* 8.8 10.6*  NEUTROABS 6.8  --  5.9 5.7  --   --   --   HGB 9.2*   < > 9.0* 9.9* 9.8* 9.8* 10.3*  HCT 34.3*   < > 34.0* 36.6 35.8* 34.9* 39.1  MCV 73.6*   < > 74.6* 71.9* 71.7* 70.5* 74.5*  PLT 191   < > 194 224 185 145* 209   < > = values in this interval not displayed.   Cardiac Enzymes: No results for input(s): CKTOTAL, CKMB, CKMBINDEX, TROPONINI in the last 168 hours. BNP: BNP (last 3 results) Recent Labs    08/10/20 1938  BNP 34.7    ProBNP (last 3 results) No results for input(s): PROBNP in the last 8760 hours.  CBG: No results for input(s): GLUCAP in the last 168 hours.     Signed:  Dia Crawford, MD [facility] [phone] pager

## 2020-08-16 NOTE — Progress Notes (Signed)
Discharge instructions and heart failure education reviewed.Patient denied questions, concerns at this time. Pt remains alert, oriented(x4). Ambulatory short distances independently.

## 2020-08-16 NOTE — Progress Notes (Signed)
SATURATION QUALIFICATIONS:  Patient Saturations on Room Air at Rest = 88 %  Patient Saturations on Room Air while Ambulating = 82 %  Patient Saturations on 2 Liters of oxygen while Ambulating = 94-98%  Please briefly explain why patient needs home oxygen: Patient ambulated 16 feet, saturation decreased 82%. Pt experienced shortness of breath with activity. Saturation increased with 2 lt [place] to 94%.

## 2020-08-16 NOTE — TOC Progression Note (Signed)
Transition of Care [facility]) - Progression Note    Patient Details  Name: [name] MRN: [id number] Date of Birth: 12/11/1965  [facility]) CM/SW Contact  Joaquin Courts, RN Phone Number: 08/16/2020, 11:33 AM  Clinical Narrative:   CM spoke with patient's son who reports that he is able to come and pick patient up from hospital today at discharge. Patient has oxygen at home and son will bring a portable concentrator with him for transport.  CM spoke with bedside RN and requested that she coordinate a pick up time with the son.     Expected Discharge Plan: [place] Barriers to Discharge: No Barriers Identified  Expected Discharge Plan and Services Expected Discharge Plan: [place] arrangements for the past 2 months: Single Family Home Expected Discharge Date: 08/16/20                                     Social Determinants of Health (SDOH) Interventions    Readmission Risk Interventions No flowsheet data found.

## 2020-08-16 NOTE — Progress Notes (Signed)
Patient stable at time of discharge, oxygen education provided. Patient son at bedside and requested staff not assist during discharge. Son arrived to unit with wheelchair and assisted pt to chair and to car for transport.

## 2020-08-21 ENCOUNTER — Telehealth: Payer: Self-pay | Admitting: Pulmonary Disease

## 2020-08-21 ENCOUNTER — Other Ambulatory Visit: Payer: Self-pay | Admitting: Adult Health

## 2020-08-21 DIAGNOSIS — J9611 Chronic respiratory failure with hypoxia: Secondary | ICD-10-CM

## 2020-08-21 NOTE — Telephone Encounter (Signed)
Called and spoke with patient.  She is asking for a POC.  Advised patient that because of covid-19, the demand for pocs is very high and they are only able to provide new patients with pocs, not established patients.  I let her know that I would contact the physician to get an order for best fit portable oxygen.  Advised we would let her know when we heard back from the provider.  She verbalized understand.  Dr. Elsworth Soho, Please advise if it is ok to send an order to [facility] for best fit portable oxygen.  Thank you.

## 2020-08-24 DIAGNOSIS — R0602 Shortness of breath: Secondary | ICD-10-CM | POA: Diagnosis not present

## 2020-08-24 DIAGNOSIS — M159 Polyosteoarthritis, unspecified: Secondary | ICD-10-CM | POA: Diagnosis not present

## 2020-08-24 DIAGNOSIS — G894 Chronic pain syndrome: Secondary | ICD-10-CM | POA: Diagnosis not present

## 2020-08-24 DIAGNOSIS — D860 Sarcoidosis of lung: Secondary | ICD-10-CM | POA: Diagnosis not present

## 2020-08-24 DIAGNOSIS — M064 Inflammatory polyarthropathy: Secondary | ICD-10-CM | POA: Diagnosis not present

## 2020-08-24 NOTE — Telephone Encounter (Signed)
OK to order POC If [facility] cannot provide, she is welcome to find from other DME or [employer]

## 2020-08-24 NOTE — Telephone Encounter (Signed)
Called and spoke with pt letting her know the info stated by RA and she verbalized understanding. Order placed for pt to have best fit eval by [facility]. Nothing further needed.

## 2020-09-03 ENCOUNTER — Ambulatory Visit: Payer: Medicare Other | Admitting: Hematology

## 2020-09-03 ENCOUNTER — Other Ambulatory Visit: Payer: Medicare Other

## 2020-09-04 NOTE — Progress Notes (Signed)
[place]   [phone] [phone]   Clinic Follow up Note   Patient Care Team: [name], NP as PCP - General (Family Medicine) Jerline Pain, MD as PCP - Cardiology (Cardiology) Wonda Horner, MD (Gastroenterology) Rigoberto Noel, MD as Consulting Physician (Pulmonary Disease) Gavin Pound, MD as Consulting Physician (Rheumatology)  Date of Service:  09/07/2020  CHIEF COMPLAINT:  F/u anemia   CURRENT THERAPY:  iv Feraheme 510mg  as needed  INTERVAL HISTORY:  [name] is here for a follow up of Anemia. She was last seen by me in 12/2019. She presents to the clinic alone.  She was admitted to hospital for CHF exacerbation last month, and I did give her 1 dose Feraheme for her iron deficiency.  Her dyspnea has improved since hospital discharge.  She noticed skin rashes on her arms, which she thinks related to her sarcoidosis.  She has not been able to see her pulmonologist Dr. Elsworth Soho because the clinic does not have a large wheelchair to accommodate her.  Her menstrual period has been much lighter lately, still regular and last 7 days.  No other bleedings. She has moderate fatigue, able to function at home.    All other systems were reviewed with the patient and are negative.  MEDICAL HISTORY:  Past Medical History:  Diagnosis Date  . Anemia   . Arthritis    hands, shoulders, no meds  . CHF (congestive heart failure) (HCC)    EF60-65%  . Chronic hyperventilation syndrome    w/ obesity tx with albuterol inhaler and oxygen 2L  . COPD (chronic obstructive pulmonary disease) ([place])    uses oxygen 2 L  . Gallstones   . GERD (gastroesophageal reflux disease)    diet controlled - no meds  . H/O hiatal hernia   . Hypertension   . Hypothyroidism   . Kidney stones   . Morbid obesity ([place])   . Pneumonia    hoispitalized in 08/2011  . Sarcoidosis   . Seasonal allergies   . Sleep apnea    uses CPAP machine     SURGICAL  HISTORY: Past Surgical History:  Procedure Laterality Date  . [place]   x 1  . CHOLECYSTECTOMY  2000  . DILATION AND CURETTAGE OF UTERUS N/A 08/09/2016   Procedure: DILATATION AND CURETTAGE;  Surgeon: Everitt Amber, MD;  Location: [facility];  Service: Gynecology;  Laterality: N/A;  . HYSTEROSCOPY WITH D & C  12/27/2011   Procedure: DILATATION AND CURETTAGE /HYSTEROSCOPY;  Surgeon: Maeola Sarah. Landry Mellow, MD;  Location: [facility];  Service: Gynecology;;  . I and D of abcess  05/2011  . INTRAUTERINE DEVICE (IUD) INSERTION N/A 08/09/2016   Procedure: INTRAUTERINE DEVICE (IUD) INSERTION;  Surgeon: Everitt Amber, MD;  Location: [facility];  Service: Gynecology;  Laterality: N/A;  . LUNG BIOPSY    . uterine abletion      I have reviewed the social history and family history with the patient and they are unchanged from previous note.  ALLERGIES:  is allergic to tape.  MEDICATIONS:  Current Outpatient Medications  Medication Sig Dispense Refill  . albuterol (PROAIR HFA) 108 (90 Base) MCG/ACT inhaler Inhale 1-2 puffs into the lungs every 6 (six) hours as needed for wheezing or shortness of breath.    Marland Kitchen atorvastatin (LIPITOR) 40 MG tablet TAKE 1 TABLET (40 MG TOTAL) BY MOUTH DAILY AT 6 PM. 90 tablet 3  . B-D TB SYRINGE 1CC/27GX1/2" 27G X  1/2" 1 ML MISC USE ONCE A WEEK TO INJECT METHOTREXATE    . calcium citrate-vitamin D (CITRACAL+D) 315-200 MG-UNIT tablet Take 1 tablet by mouth daily.    . cetirizine (ZYRTEC) 10 MG tablet Take 10 mg by mouth at bedtime as needed for allergies.    . famotidine (PEPCID) 40 MG tablet TAKE 1 TABLET BY MOUTH TWICE A DAY 557 tablet 3  . folic acid (FOLVITE) 1 MG tablet Take 1 mg by mouth daily.  3  . HYDROcodone-acetaminophen (NORCO) 10-325 MG tablet Take 1 tablet by mouth every 6 (six) hours as needed (pain). 30 tablet 0  . losartan (COZAAR) 100 MG tablet TAKE 1 TABLET (100 MG TOTAL) DAILY BY MOUTH. 90 tablet 3  . methotrexate 50 MG/2ML injection Inject 20 mg into the vein  once a week. Dose is 0.8 mL (20mg ). Take on Fridays    . metoprolol succinate (TOPROL-XL) 25 MG 24 hr tablet TAKE 1 TABLET BY MOUTH EVERY DAY (Patient taking differently: Take 25 mg by mouth daily. ) 90 tablet 3  . NON FORMULARY 2 liter of oxygen    . predniSONE (DELTASONE) 10 MG tablet Take 1 tablet (10 mg total) by mouth daily. 10 tablet 0  . torsemide (DEMADEX) 20 MG tablet Take 2 tablets (40 mg total) by mouth daily. Take additional 20 mg for weight gain of 3 lbs in 1 day or 5 lbs in 2 days or swelling or shortness of breath. 90 tablet 0  . triamcinolone acetonide (KENALOG) 40 MG/ML injection Inject 40 mg into the muscle every 3 (three) months. For knee pain    . triamcinolone ointment (KENALOG) 0.1 % Apply 1 application topically 2 (two) times daily. For Sarcoidosis    . vitamin B-12 1000 MCG tablet Take 1 tablet (1,000 mcg total) by mouth daily. 30 tablet 0   No current facility-administered medications for this visit.    PHYSICAL EXAMINATION: ECOG PERFORMANCE STATUS: 2 - Symptomatic, <50% confined to bed  Vitals:   09/07/20 0826 09/07/20 0841  BP: (!) 175/81 139/85  Pulse: 100   Resp: 19   Temp: 99.7 F (37.6 C)   SpO2: 91%    Filed Weights   09/07/20 0826  Weight: (!) 482 lb (218.6 kg)    GENERAL:alert, no distress and comfortable, morbid obese lady  SKIN: skin color, texture, turgor are normal, no rashes or significant lesions, a few scatter papular skin rashes on arms.  EYES: normal, Conjunctiva are pink and non-injected, sclera clear NEURO: alert & oriented x 3 with fluent speech, no focal motor/sensory deficits  LABORATORY DATA:  I have reviewed the data as listed CBC Latest Ref Rng & Units 09/07/2020 08/16/2020 08/15/2020  WBC 4.0 - 10.5 K/uL 9.0 10.6(H) 8.8  Hemoglobin 12.0 - 15.0 g/dL 11.5(L) 10.3(L) 9.8(L)  Hematocrit 36 - 46 % 41.4 39.1 34.9(L)  Platelets 150 - 400 K/uL 234 209 145(L)     CMP Latest Ref Rng & Units 08/16/2020 08/15/2020 08/14/2020  Glucose  70 - 99 mg/dL 99 85 106(H)  BUN 6 - 20 mg/dL 27(H) 26(H) 28(H)  Creatinine 0.44 - 1.00 mg/dL 1.30(H) 1.22(H) 1.18(H)  Sodium 135 - 145 mmol/L 142 142 141  Potassium 3.5 - 5.1 mmol/L 3.8 4.7 4.0  Chloride 98 - 111 mmol/L 90(L) 92(L) 91(L)  CO2 22 - 32 mmol/L 34(H) 35(H) 35(H)  Calcium 8.9 - 10.3 mg/dL 9.5 8.7(L) 9.1  Total Protein 6.5 - 8.1 g/dL 8.2(H) 7.1 7.7  Total Bilirubin 0.3 - 1.2 mg/dL  1.0 1.6(H) 0.9  Alkaline Phos 38 - 126 U/L 46 40 45  AST 15 - 41 U/L 20 30 14(L)  ALT 0 - 44 U/L 13 9 13       RADIOGRAPHIC STUDIES: I have personally reviewed the radiological images as listed and agreed with the findings in the report. No results found.   ASSESSMENT & PLAN:  DANYEL TOBEY is a 54 y.o. female with   1. Iron deficient anemia from Menorrhagia -Her multiple iron studies previouslyshowed very low level of ferritin, serum iron level and transferrin saturation, her MCV is low, consistent with iron deficient anemia. -Her iron deficiency is likely from her menorrhagia -she has tried IUD, but did not help much and it was removed, her menorrhagia has improved some since then  -She is not very compliant with oral iron supplement, responded well to IV iron. She has required IV iron every 2-4 monthsinthe past few years. Last on 07/2020 -Her menstrual period has been lighter, hopefully she will need to less IV iron -Lab reviewed, anemia improved since last IV iron 3 weeks ago, iron studies still pending.  -Labs every 3 months, will set up IV Feraheme if ferritin less than 50 -Follow-up in 9 months   2. Hypertension, pulmonary sarcomatosis, congestive heart failure  -F/u with PCP -Admitted to the ED 04/16/18-04/20/18 for SOB, 10/26/2018 for SOB 10/19/2018 -She was also admitted to ED 08/16/19-08/22/19 for SOB and acute on chronic heart failure. She was started on Demadex.  -Uses CPAP -She is still on long term use of Prednisone for her Sarcoidosis and RA. Currently on 10mg .   -She has developed a skin rash, for which she feels is related to her sarcoidosis.  I encouraged her to follow-up with her pulmonologist Dr. Elsworth Soho   3. Morbid obesity  -We have repeatedly discussed weight loss and I recommend her to see weight management clinic, she declinedpreviously.   Plan -Every 3 months, will set up IV Feraheme if ferritin less than 50 -f/u in 9 months    No problem-specific Assessment & Plan notes found for this encounter.   No orders of the defined types were placed in this encounter.  All questions were answered. The patient knows to call the clinic with any problems, questions or concerns. No barriers to learning was detected. The total time spent in the appointment was 20 minutes.     Truitt Merle, MD 09/07/2020   I, Joslyn Devon, am acting as scribe for Truitt Merle, MD.   I have reviewed the above documentation for accuracy and completeness, and I agree with the above.

## 2020-09-07 ENCOUNTER — Inpatient Hospital Stay (HOSPITAL_BASED_OUTPATIENT_CLINIC_OR_DEPARTMENT_OTHER): Payer: Medicare Other | Admitting: Hematology

## 2020-09-07 ENCOUNTER — Encounter: Payer: Self-pay | Admitting: Hematology

## 2020-09-07 ENCOUNTER — Other Ambulatory Visit: Payer: Self-pay

## 2020-09-07 ENCOUNTER — Inpatient Hospital Stay: Payer: Medicare Other | Attending: Hematology

## 2020-09-07 VITALS — BP 139/85 | HR 100 | Temp 99.7°F | Resp 19 | Ht 66.0 in | Wt >= 6400 oz

## 2020-09-07 DIAGNOSIS — J449 Chronic obstructive pulmonary disease, unspecified: Secondary | ICD-10-CM | POA: Diagnosis not present

## 2020-09-07 DIAGNOSIS — D500 Iron deficiency anemia secondary to blood loss (chronic): Secondary | ICD-10-CM

## 2020-09-07 DIAGNOSIS — Z79899 Other long term (current) drug therapy: Secondary | ICD-10-CM | POA: Diagnosis not present

## 2020-09-07 DIAGNOSIS — E039 Hypothyroidism, unspecified: Secondary | ICD-10-CM | POA: Insufficient documentation

## 2020-09-07 DIAGNOSIS — I110 Hypertensive heart disease with heart failure: Secondary | ICD-10-CM | POA: Diagnosis not present

## 2020-09-07 DIAGNOSIS — N920 Excessive and frequent menstruation with regular cycle: Secondary | ICD-10-CM | POA: Diagnosis present

## 2020-09-07 DIAGNOSIS — I509 Heart failure, unspecified: Secondary | ICD-10-CM | POA: Insufficient documentation

## 2020-09-07 DIAGNOSIS — K219 Gastro-esophageal reflux disease without esophagitis: Secondary | ICD-10-CM | POA: Insufficient documentation

## 2020-09-07 LAB — IRON AND TIBC
Iron: 27 ug/dL — ABNORMAL LOW (ref 41–142)
Saturation Ratios: 8 % — ABNORMAL LOW (ref 21–57)
TIBC: 320 ug/dL (ref 236–444)
UIBC: 293 ug/dL (ref 120–384)

## 2020-09-07 LAB — CBC WITH DIFFERENTIAL/PLATELET
Abs Immature Granulocytes: 0.05 10*3/uL (ref 0.00–0.07)
Basophils Absolute: 0 10*3/uL (ref 0.0–0.1)
Basophils Relative: 0 %
Eosinophils Absolute: 0 10*3/uL (ref 0.0–0.5)
Eosinophils Relative: 0 %
HCT: 41.4 % (ref 36.0–46.0)
Hemoglobin: 11.5 g/dL — ABNORMAL LOW (ref 12.0–15.0)
Immature Granulocytes: 1 %
Lymphocytes Relative: 6 %
Lymphs Abs: 0.5 10*3/uL — ABNORMAL LOW (ref 0.7–4.0)
MCH: 21.2 pg — ABNORMAL LOW (ref 26.0–34.0)
MCHC: 27.8 g/dL — ABNORMAL LOW (ref 30.0–36.0)
MCV: 76.2 fL — ABNORMAL LOW (ref 80.0–100.0)
Monocytes Absolute: 0.2 10*3/uL (ref 0.1–1.0)
Monocytes Relative: 2 %
Neutro Abs: 8.2 10*3/uL — ABNORMAL HIGH (ref 1.7–7.7)
Neutrophils Relative %: 91 %
Platelets: 234 10*3/uL (ref 150–400)
RBC: 5.43 MIL/uL — ABNORMAL HIGH (ref 3.87–5.11)
RDW: 28.8 % — ABNORMAL HIGH (ref 11.5–15.5)
WBC: 9 10*3/uL (ref 4.0–10.5)
nRBC: 0 % (ref 0.0–0.2)

## 2020-09-07 LAB — RETICULOCYTES
Immature Retic Fract: 27.5 % — ABNORMAL HIGH (ref 2.3–15.9)
RBC.: 5.43 MIL/uL — ABNORMAL HIGH (ref 3.87–5.11)
Retic Count, Absolute: 66.8 10*3/uL (ref 19.0–186.0)
Retic Ct Pct: 1.2 % (ref 0.4–3.1)

## 2020-09-07 LAB — FERRITIN: Ferritin: 51 ng/mL (ref 11–307)

## 2020-09-08 ENCOUNTER — Telehealth: Payer: Self-pay | Admitting: Hematology

## 2020-09-08 DIAGNOSIS — G4733 Obstructive sleep apnea (adult) (pediatric): Secondary | ICD-10-CM | POA: Diagnosis not present

## 2020-09-08 NOTE — Telephone Encounter (Signed)
Scheduled per 11/8 los. Pt is aware of appt times and dates. 

## 2020-09-09 ENCOUNTER — Telehealth: Payer: Self-pay

## 2020-09-09 NOTE — Telephone Encounter (Signed)
Made aware of lab results follow up in three months plans for possible iron infusion

## 2020-09-09 NOTE — Telephone Encounter (Signed)
-----   Message from Truitt Merle, MD sent at 09/08/2020 10:26 PM EST ----- Please let pt know her iron study result. Ferritin 51, no iv iron for now. She will likely need iv iron in near future, so OK to schedule feraheme infusion on next lab in 3 months if she wants.   Thanks  Truitt Merle

## 2020-09-10 DIAGNOSIS — G4733 Obstructive sleep apnea (adult) (pediatric): Secondary | ICD-10-CM | POA: Diagnosis not present

## 2020-09-18 ENCOUNTER — Telehealth (INDEPENDENT_AMBULATORY_CARE_PROVIDER_SITE_OTHER): Payer: Medicare Other | Admitting: Family Medicine

## 2020-09-18 DIAGNOSIS — J029 Acute pharyngitis, unspecified: Secondary | ICD-10-CM | POA: Diagnosis not present

## 2020-09-18 MED ORDER — AMOXICILLIN 875 MG PO TABS: 875.0000 mg | ORAL_TABLET | Freq: Two times a day (BID) | ORAL | 0 refills | Status: DC

## 2020-09-18 NOTE — Progress Notes (Signed)
Patient ID: [name], female   DOB: May 10, 1966, 54 y.o.   MRN: [id number]  This visit type was conducted due to national recommendations for restrictions regarding the COVID-19 pandemic in an effort to limit this patient's exposure and mitigate transmission in our community.   Virtual Visit via Video Note  I connected with [name] on 09/18/20 at  1:30 PM EST by a video enabled telemedicine application and verified that I am speaking with the correct person using two identifiers.  Location patient: home Location provider:work or home office Persons participating in the virtual visit: patient, provider  I discussed the limitations of evaluation and management by telemedicine and the availability of in person appointments. The patient expressed understanding and agreed to proceed.   HPI: [name] relates sore throat which started yesterday.  She states she has been very isolated for the most part and about only when she sees her son.  He unfortunately did get sick and went to clinic earlier in the week and had Covid testing.  This was reportedly PCR and came back negative.  She states she had fever 101 this morning but down at this time.  No cough.  Minimal nasal congestion.  Some body aches.  She states her son is being treated for strep throat though is not clear that whether he was tested for strep.  Patient denies any rash.  No loss of taste or smell.  She has history of sarcoidosis and is on chronic oxygen   ROS: See pertinent positives and negatives per HPI.  Past Medical History:  Diagnosis Date  . Anemia   . Arthritis    hands, shoulders, no meds  . CHF (congestive heart failure) (HCC)    EF60-65%  . Chronic hyperventilation syndrome    w/ obesity tx with albuterol inhaler and oxygen 2L  . COPD (chronic obstructive pulmonary disease) ([place])    uses oxygen 2 L  . Gallstones   . GERD (gastroesophageal reflux disease)    diet controlled - no meds  . H/O  hiatal hernia   . Hypertension   . Hypothyroidism   . Kidney stones   . Morbid obesity ([place])   . Pneumonia    hoispitalized in 08/2011  . Sarcoidosis   . Seasonal allergies   . Sleep apnea    uses CPAP machine     Past Surgical History:  Procedure Laterality Date  . [place]   x 1  . CHOLECYSTECTOMY  2000  . DILATION AND CURETTAGE OF UTERUS N/A 08/09/2016   Procedure: DILATATION AND CURETTAGE;  Surgeon: Everitt Amber, MD;  Location: [facility];  Service: Gynecology;  Laterality: N/A;  . HYSTEROSCOPY WITH D & C  12/27/2011   Procedure: DILATATION AND CURETTAGE /HYSTEROSCOPY;  Surgeon: Maeola Sarah. Landry Mellow, MD;  Location: [facility];  Service: Gynecology;;  . I and D of abcess  05/2011  . INTRAUTERINE DEVICE (IUD) INSERTION N/A 08/09/2016   Procedure: INTRAUTERINE DEVICE (IUD) INSERTION;  Surgeon: Everitt Amber, MD;  Location: [facility];  Service: Gynecology;  Laterality: N/A;  . LUNG BIOPSY    . uterine abletion      Family History  Problem Relation Age of Onset  . Diabetes Father   . Cancer Father 36       colon cancer   . Diabetes Brother   . Deep vein thrombosis Mother   . Aneurysm Sister        d/o brain aneurysm    SOCIAL HX: Non-smoker  Current Outpatient Medications:  .  albuterol (PROAIR HFA) 108 (90 Base) MCG/ACT inhaler, Inhale 1-2 puffs into the lungs every 6 (six) hours as needed for wheezing or shortness of breath., Disp: , Rfl:  .  amoxicillin (AMOXIL) 875 MG tablet, Take 1 tablet (875 mg total) by mouth 2 (two) times daily., Disp: 20 tablet, Rfl: 0 .  atorvastatin (LIPITOR) 40 MG tablet, TAKE 1 TABLET (40 MG TOTAL) BY MOUTH DAILY AT 6 PM., Disp: 90 tablet, Rfl: 3 .  B-D TB SYRINGE 1CC/27GX1/2" 27G X 1/2" 1 ML MISC, USE ONCE A WEEK TO INJECT METHOTREXATE, Disp: , Rfl:  .  calcium citrate-vitamin D (CITRACAL+D) 315-200 MG-UNIT tablet, Take 1 tablet by mouth daily., Disp: , Rfl:  .  cetirizine (ZYRTEC) 10 MG tablet, Take 10 mg by mouth at bedtime as needed for  allergies., Disp: , Rfl:  .  famotidine (PEPCID) 40 MG tablet, TAKE 1 TABLET BY MOUTH TWICE A DAY, Disp: 180 tablet, Rfl: 3 .  folic acid (FOLVITE) 1 MG tablet, Take 1 mg by mouth daily., Disp: , Rfl: 3 .  HYDROcodone-acetaminophen (NORCO) 10-325 MG tablet, Take 1 tablet by mouth every 6 (six) hours as needed (pain)., Disp: 30 tablet, Rfl: 0 .  losartan (COZAAR) 100 MG tablet, TAKE 1 TABLET (100 MG TOTAL) DAILY BY MOUTH., Disp: 90 tablet, Rfl: 3 .  methotrexate 50 MG/2ML injection, Inject 20 mg into the vein once a week. Dose is 0.8 mL (20mg ). Take on Fridays, Disp: , Rfl:  .  metoprolol succinate (TOPROL-XL) 25 MG 24 hr tablet, TAKE 1 TABLET BY MOUTH EVERY DAY (Patient taking differently: Take 25 mg by mouth daily. ), Disp: 90 tablet, Rfl: 3 .  NON FORMULARY, 2 liter of oxygen, Disp: , Rfl:  .  predniSONE (DELTASONE) 10 MG tablet, Take 1 tablet (10 mg total) by mouth daily., Disp: 10 tablet, Rfl: 0 .  torsemide (DEMADEX) 20 MG tablet, Take 2 tablets (40 mg total) by mouth daily. Take additional 20 mg for weight gain of 3 lbs in 1 day or 5 lbs in 2 days or swelling or shortness of breath., Disp: 90 tablet, Rfl: 0 .  triamcinolone acetonide (KENALOG) 40 MG/ML injection, Inject 40 mg into the muscle every 3 (three) months. For knee pain, Disp: , Rfl:  .  triamcinolone ointment (KENALOG) 0.1 %, Apply 1 application topically 2 (two) times daily. For Sarcoidosis, Disp: , Rfl:  .  vitamin B-12 1000 MCG tablet, Take 1 tablet (1,000 mcg total) by mouth daily., Disp: 30 tablet, Rfl: 0  EXAM:  VITALS per patient if applicable:  GENERAL: alert, oriented, appears well and in no acute distress  HEENT: atraumatic, conjunttiva clear, no obvious abnormalities on inspection of external nose and ears  NECK: normal movements of the head and neck  LUNGS: on inspection no signs of respiratory distress, breathing rate appears normal, no obvious gross SOB, gasping or wheezing  CV: no obvious cyanosis  MS: moves  all visible extremities without noticeable abnormality  PSYCH/NEURO: pleasant and cooperative, no obvious depression or anxiety, speech and thought processing grossly intact  ASSESSMENT AND PLAN:  Discussed the following assessment and plan:  Sore throat.  Possible exposure to strep throat.  -We elected to go and cover with amoxicillin 875 mg twice daily for 10 days -Tylenol as needed for sore throat symptoms -Follow-up for any persistent or worsening symptoms     I discussed the assessment and treatment plan with the patient. The patient was provided an  opportunity to ask questions and all were answered. The patient agreed with the plan and demonstrated an understanding of the instructions.   The patient was advised to call back or seek an in-person evaluation if the symptoms worsen or if the condition fails to improve as anticipated.     Carolann Littler, MD

## 2020-10-10 DIAGNOSIS — G4733 Obstructive sleep apnea (adult) (pediatric): Secondary | ICD-10-CM | POA: Diagnosis not present

## 2020-11-10 DIAGNOSIS — G4733 Obstructive sleep apnea (adult) (pediatric): Secondary | ICD-10-CM | POA: Diagnosis not present

## 2020-11-17 ENCOUNTER — Ambulatory Visit: Payer: Medicare Other

## 2020-11-17 NOTE — Progress Notes (Signed)
Virtual Visit via Video Note   This visit type was conducted due to national recommendations for restrictions regarding the COVID-19 Pandemic (e.g. social distancing) in an effort to limit this patient's exposure and mitigate transmission in our community.  Due to her co-morbid illnesses, this patient is at least at moderate risk for complications without adequate follow up.  This format is felt to be most appropriate for this patient at this time.  All issues noted in this document were discussed and addressed.  A limited physical exam was performed with this format.  Please refer to the patient's chart for her consent to telehealth for [facility].       Date:  11/24/2020   ID:  [name], DOB 25-Feb-1966, MRN [id number] The patient was identified using 2 identifiers.  Patient Location: Home Provider Location: Home Office  PCP:  [name], NP  Cardiologist:  Candee Furbish, MD  Electrophysiologist:  None   Evaluation Performed:  Follow-Up Visit  Chief Complaint:  CHF   History of Present Illness:    [name] is a 55 y.o. female with a hx of diastolic heart failure, super morbid obesity, COPD with chronic respiratory failure on home 2L, OSA/sleep apnea, hypoventilation syndrome.   [name] has had multiple admissions for CHF for which she has been diuresed with IV lasix and PO Lasix transitioned to torsemide with improvement. Last echocardiogram with EF at 55-60% with moderate LVH and mildly elevated pulmonary artery systolic pressures.   She had a recent hospitalization for CHF 07/2020. Echo showed stable EF at 55-60% with G2DD, RV pressure overload, mildly reduced RV systolic function with moderately elevated PASP. Home torsemide was increased to 40mg . There was no recommendation for RHC. D/C weight 221.2kg  Today she states that she has been doing really well from a CHF standpoint. She has a cold but states that she does not go anywhere and her son  who lives with her tested negative for COVID. She continues to wear 2L O2 and has a stable weight on Torsemide. She feel that her fluid is more controlled than while on Lasix. She does state that the 40mg  is hindering her ability to go out of her home due to frequent urination. We discussed redcing the dose to 20mg  QD with the ability to increase to 40mg  based on weight and breathing. She does not want to come to the office for labs therefore we will have [place] draw a BMET with no urgency. She denies chest pain, LE edema, recent weight gain, no change in SOB, no palpitations or orthopnea.    The patient does not have symptoms concerning for COVID-19 infection (fever, chills, cough, or new shortness of breath).    Past Medical History:  Diagnosis Date  . Anemia   . Arthritis    hands, shoulders, no meds  . CHF (congestive heart failure) (HCC)    EF60-65%  . Chronic hyperventilation syndrome    w/ obesity tx with albuterol inhaler and oxygen 2L  . COPD (chronic obstructive pulmonary disease) ([place])    uses oxygen 2 L  . Gallstones   . GERD (gastroesophageal reflux disease)    diet controlled - no meds  . H/O hiatal hernia   . Hypertension   . Hypothyroidism   . Kidney stones   . Morbid obesity ([place])   . Pneumonia    hoispitalized in 08/2011  . Sarcoidosis   . Seasonal allergies   . Sleep apnea    uses  CPAP machine    Past Surgical History:  Procedure Laterality Date  . [place]   x 1  . CHOLECYSTECTOMY  2000  . DILATION AND CURETTAGE OF UTERUS N/A 08/09/2016   Procedure: DILATATION AND CURETTAGE;  Surgeon: Everitt Amber, MD;  Location: [facility];  Service: Gynecology;  Laterality: N/A;  . HYSTEROSCOPY WITH D & C  12/27/2011   Procedure: DILATATION AND CURETTAGE /HYSTEROSCOPY;  Surgeon: Maeola Sarah. Landry Mellow, MD;  Location: [facility];  Service: Gynecology;;  . I and D of abcess  05/2011  . INTRAUTERINE DEVICE (IUD) INSERTION N/A 08/09/2016   Procedure: INTRAUTERINE DEVICE (IUD)  INSERTION;  Surgeon: Everitt Amber, MD;  Location: [facility];  Service: Gynecology;  Laterality: N/A;  . LUNG BIOPSY    . uterine abletion       Current Meds  Medication Sig  . albuterol (PROAIR HFA) 108 (90 Base) MCG/ACT inhaler Inhale 1-2 puffs into the lungs every 6 (six) hours as needed for wheezing or shortness of breath.  Marland Kitchen atorvastatin (LIPITOR) 40 MG tablet TAKE 1 TABLET (40 MG TOTAL) BY MOUTH DAILY AT 6 PM.  . B-D TB SYRINGE 1CC/27GX1/2" 27G X 1/2" 1 ML MISC USE ONCE A WEEK TO INJECT METHOTREXATE  . calcium citrate-vitamin D (CITRACAL+D) 315-200 MG-UNIT tablet Take 1 tablet by mouth daily.  . cetirizine (ZYRTEC) 10 MG tablet Take 10 mg by mouth at bedtime as needed for allergies.  . famotidine (PEPCID) 40 MG tablet TAKE 1 TABLET BY MOUTH TWICE A DAY  . folic acid (FOLVITE) 1 MG tablet Take 1 mg by mouth daily.  Marland Kitchen HYDROcodone-acetaminophen (NORCO) 10-325 MG tablet Take 1 tablet by mouth every 6 (six) hours as needed (pain).  Marland Kitchen losartan (COZAAR) 100 MG tablet TAKE 1 TABLET (100 MG TOTAL) DAILY BY MOUTH.  . methotrexate 50 MG/2ML injection Inject 20 mg into the vein once a week. Dose is 0.8 mL (20mg ). Take on Fridays  . metoprolol succinate (TOPROL-XL) 25 MG 24 hr tablet TAKE 1 TABLET BY MOUTH EVERY DAY (Patient taking differently: Take 25 mg by mouth daily.)  . NON FORMULARY 2 liter of oxygen  . predniSONE (DELTASONE) 10 MG tablet Take 1 tablet (10 mg total) by mouth daily.  Marland Kitchen torsemide (DEMADEX) 20 MG tablet Take 2 tablets (40 mg total) by mouth daily. Take additional 20 mg for weight gain of 3 lbs in 1 day or 5 lbs in 2 days or swelling or shortness of breath.  . triamcinolone ointment (KENALOG) 0.1 % Apply 1 application topically 2 (two) times daily. For Sarcoidosis  . vitamin B-12 1000 MCG tablet Take 1 tablet (1,000 mcg total) by mouth daily.  . [DISCONTINUED] triamcinolone acetonide (KENALOG-40) 40 MG/ML injection Inject 40 mg into the muscle every 3 (three) months. For knee pain      Allergies:   Tape   Social History   Tobacco Use  . Smoking status: Never Smoker  . Smokeless tobacco: Never Used  Vaping Use  . Vaping Use: Never used  Substance Use Topics  . Alcohol use: Yes    Comment: occasionally  . Drug use: No     Family Hx: The patient's family history includes Aneurysm in her sister; Cancer (age of onset: 84) in her father; Deep vein thrombosis in her mother; Diabetes in her brother and father.  ROS:   Please see the history of present illness.     All other systems reviewed and are negative  Prior CV studies:   The  following studies were reviewed today:  Echocardiogram 08/11/20:  1. Left ventricular ejection fraction, by estimation, is 55 to 60%. The  left ventricle has normal function. The left ventricle has no regional  wall motion abnormalities. There is mild concentric left ventricular  hypertrophy. Left ventricular diastolic  parameters are consistent with Grade II diastolic dysfunction  (pseudonormalization). There is the interventricular septum is flattened  in systole, consistent with right ventricular pressure overload.  2. Right ventricular systolic function is mildly reduced. The right  ventricular size is mildly enlarged. There is moderately elevated  pulmonary artery systolic pressure.  3. The mitral valve is normal in structure. No evidence of mitral valve  regurgitation.  4. The aortic valve is normal in structure. Aortic valve regurgitation is  not visualized.   Labs/Other Tests and Data Reviewed:    EKG:  No ECG reviewed.  Recent Labs: 08/10/2020: B Natriuretic Peptide 34.7 08/16/2020: ALT 13; BUN 27; Creatinine, Ser 1.30; Magnesium 2.0; Potassium 3.8; Sodium 142 09/07/2020: Hemoglobin 11.5; Platelets 234   Recent Lipid Panel Lab Results  Component Value Date/Time   CHOL 241 (H) 05/08/2019 08:11 AM   TRIG 125.0 05/08/2019 08:11 AM   HDL 58.80 05/08/2019 08:11 AM   CHOLHDL 4 05/08/2019 08:11 AM   LDLCALC 157  (H) 05/08/2019 08:11 AM    Wt Readings from Last 3 Encounters:  11/24/20 (!) 487 lb (220.9 kg)  09/07/20 (!) 482 lb (218.6 kg)  08/16/20 (!) 487 lb 11.2 oz (221.2 kg)     Objective:    Vital Signs:  BP (!) 154/86   Pulse 89   Ht 5' 5.5" (1.664 m)   Wt (!) 487 lb (220.9 kg)   BMI 79.81 kg/m   VITAL SIGNS:  reviewed GEN:  no acute distress EYES:  sclerae anicteric, EOMI - Extraocular Movements Intact NEURO:  alert and oriented x 3, no obvious focal deficit PSYCH:  normal affect  ASSESSMENT & PLAN:    1. Chronic diastolic heart failure: -Felt to be multifactorial with obesity, ILD and diastolic HF. Denies recent change in weight or breathing. Asking to reduce Torsemide to 20mg  due to frequent urination. Will reduce to 20mg  with the ability to increase to 40mg  QD for weight gain or 3lb in one day or 5lb in one week. Labs per HH>>BMET and follow Cr result   -Last echo 07/2020 with normal LVEF and G2DD  2. HTN: -Elevated today however reports she has not yet taken her AM antihypertensives>>reports better control with meds  -Follow low Na+ diet  3. OSA: -Compliant with CPAP, home supplemental oxygen  4. Iron deficiency anemia: -Followed by oncology   5. Sarcoidosis with chronic hypoxia: -Follows with pulmonary>>on methotrexate and prednisone     COVID-19 Education: The signs and symptoms of COVID-19 were discussed with the patient and how to seek care for testing (follow up with PCP or arrange E-visit).  The importance of social distancing was discussed today.  Time:   Today, I have spent 20 minutes with the patient with telehealth technology discussing the above problems.   Medication Adjustments/Labs and Tests Ordered: Current medicines are reviewed at length with the patient today.  Concerns regarding medicines are outlined above.   Tests Ordered: No orders of the defined types were placed in this encounter.   Medication Changes: No orders of the defined types were  placed in this encounter.   Follow Up:  Virtual Visit  with Dr. [name] or myself in 6-8 months   Signed, Kathyrn Drown,  NP  11/24/2020 10:11 AM    [facility]

## 2020-11-24 ENCOUNTER — Encounter: Payer: Self-pay | Admitting: Cardiology

## 2020-11-24 ENCOUNTER — Telehealth (INDEPENDENT_AMBULATORY_CARE_PROVIDER_SITE_OTHER): Payer: Medicare Other | Admitting: Cardiology

## 2020-11-24 ENCOUNTER — Other Ambulatory Visit: Payer: Self-pay

## 2020-11-24 ENCOUNTER — Telehealth: Payer: Self-pay | Admitting: *Deleted

## 2020-11-24 VITALS — BP 154/86 | HR 89 | Ht 65.5 in | Wt >= 6400 oz

## 2020-11-24 DIAGNOSIS — G4733 Obstructive sleep apnea (adult) (pediatric): Secondary | ICD-10-CM | POA: Diagnosis not present

## 2020-11-24 DIAGNOSIS — J9611 Chronic respiratory failure with hypoxia: Secondary | ICD-10-CM | POA: Diagnosis not present

## 2020-11-24 DIAGNOSIS — D860 Sarcoidosis of lung: Secondary | ICD-10-CM

## 2020-11-24 DIAGNOSIS — Z9989 Dependence on other enabling machines and devices: Secondary | ICD-10-CM

## 2020-11-24 DIAGNOSIS — I10 Essential (primary) hypertension: Secondary | ICD-10-CM | POA: Diagnosis not present

## 2020-11-24 DIAGNOSIS — I5032 Chronic diastolic (congestive) heart failure: Secondary | ICD-10-CM | POA: Diagnosis not present

## 2020-11-24 NOTE — Patient Instructions (Addendum)
Medication Instructions:  Your physician has recommended you make the following change in your medication:  1.  Your Torsemide has changed to one tablet ( 20 mg) by mouth daily and take two tablets (40 mg ) by mouth for increase wt 3 lbs in one day and 5 lbs in one week.   *If you need a refill on your cardiac medications before your next appointment, please call your pharmacy*   Lab Work: [facility] will come out to patients house and draw a bmet.  If you have labs (blood work) drawn today and your tests are completely normal, you will receive your results only by: Marland Kitchen MyChart Message (if you have MyChart) OR . A paper copy in the mail If you have any lab test that is abnormal or we need to change your treatment, we will call you to review the results.   Testing/Procedures: Pt it to get a Home Health advocate to come to house and draw bmet.   Follow-Up: At [facility], you and your health needs are our priority.  As part of our continuing mission to provide you with exceptional heart care, we have created designated Provider Care Teams.  These Care Teams include your primary Cardiologist (physician) and Advanced Practice Providers (APPs -  Physician Assistants and Nurse Practitioners) who all work together to provide you with the care you need, when you need it.  We recommend signing up for the patient portal called "MyChart".  Sign up information is provided on this After Visit Summary.  MyChart is used to connect with patients for Virtual Visits (Telemedicine).  Patients are able to view lab/test results, encounter notes, upcoming appointments, etc.  Non-urgent messages can be sent to your provider as well.   To learn more about what you can do with MyChart, go to [email or web address].    Your next appointment:   6 month(s)  The format for your next appointment:   Virtual Visit   Provider:   You may see Candee Furbish, MD 6-8 months or one of the following Advanced Practice  Providers on your designated Care Team:    Cecilie Kicks, NP  Kathyrn Drown, NP    Other Instructions Your physician wants you to follow-up in: 6 You will receive a reminder letter in the mail two months in advance. If you don't receive a letter, please call our office to schedule the follow-up appointment.

## 2020-11-24 NOTE — Telephone Encounter (Signed)
[place] Visit Follow Up Request   Date of Request ([place]):  November 24, 2020  Requesting Provider:  Kathyrn Drown, NP    Agency Requested:    [facility] Contact:  Glory Buff, NP [address] [place], [place] [address] Phone #:  [phone] Fax #:  [phone]  Patient Demographic Information: Name:  [name] Age:  55 y.o.   DOB:  1965/12/04  MRN:  [id number]   Home visit progress note(s), lab results, telemetry strips, etc were reviewed.  Provider Recommendations: LAB WORK  Follow up home services requested:  Labs:  BMET   TO BE DONE IN THE NEXT 1-2 WEEKS   NUMBER OF VISITS 1

## 2020-11-24 NOTE — Telephone Encounter (Signed)
  Patient Consent for Virtual Visit         [name] has provided verbal consent on 11/24/2020 for a virtual visit (video or telephone).   CONSENT FOR VIRTUAL VISIT FOR:  [name]  By participating in this virtual visit I agree to the following:  I hereby voluntarily request, consent and authorize [facility] and its employed or contracted physicians, physician assistants, nurse practitioners or other licensed health care professionals (the Practitioner), to provide me with telemedicine health care services (the "Services") as deemed necessary by the treating Practitioner. I acknowledge and consent to receive the Services by the Practitioner via telemedicine. I understand that the telemedicine visit will involve communicating with the Practitioner through live audiovisual communication technology and the disclosure of certain medical information by electronic transmission. I acknowledge that I have been given the opportunity to request an in-person assessment or other available alternative prior to the telemedicine visit and am voluntarily participating in the telemedicine visit.  I understand that I have the right to withhold or withdraw my consent to the use of telemedicine in the course of my care at any time, without affecting my right to future care or treatment, and that the Practitioner or I may terminate the telemedicine visit at any time. I understand that I have the right to inspect all information obtained and/or recorded in the course of the telemedicine visit and may receive copies of available information for a reasonable fee.  I understand that some of the potential risks of receiving the Services via telemedicine include:  Marland Kitchen Delay or interruption in medical evaluation due to technological equipment failure or disruption; . Information transmitted may not be sufficient (e.g. poor resolution of images) to allow for appropriate medical decision making by the  Practitioner; and/or  . In rare instances, security protocols could fail, causing a breach of personal health information.  Furthermore, I acknowledge that it is my responsibility to provide information about my medical history, conditions and care that is complete and accurate to the best of my ability. I acknowledge that Practitioner's advice, recommendations, and/or decision may be based on factors not within their control, such as incomplete or inaccurate data provided by me or distortions of diagnostic images or specimens that may result from electronic transmissions. I understand that the practice of medicine is not an exact science and that Practitioner makes no warranties or guarantees regarding treatment outcomes. I acknowledge that a copy of this consent can be made available to me via my patient portal ([facility]), or I can request a printed copy by calling the office of [facility].    I understand that my insurance will be billed for this visit.   I have read or had this consent read to me. . I understand the contents of this consent, which adequately explains the benefits and risks of the Services being provided via telemedicine.  . I have been provided ample opportunity to ask questions regarding this consent and the Services and have had my questions answered to my satisfaction. . I give my informed consent for the services to be provided through the use of telemedicine in my medical care

## 2020-11-24 NOTE — Addendum Note (Signed)
Addended by: Tamsen Snider on: 11/24/2020 10:27 AM   Modules accepted: Orders

## 2020-11-25 ENCOUNTER — Telehealth: Payer: Self-pay | Admitting: Cardiology

## 2020-11-25 NOTE — Telephone Encounter (Signed)
Messaged received today from [facility] with [facility], see note. I will let requesting provider know of message sent to our office today. I will send request to [facility] to see if they will be able to go out and see the pt.

## 2020-11-25 NOTE — Telephone Encounter (Signed)
[place] Visit Initial Request  Date of Request ([place]):  November 25, 2020  Requesting Provider:  Kathyrn Drown, NP    Agency Requested:    [facility]:  [address], [place], [place], [place], [place]  [address] [email or web address]  Phone #: [phone] Fax #: [phone]  Patient Demographic Information: Name:  [name] Age:  55 y.o.   DOB:  02-23-1966  MRN:  [id number]   Address:   [place] [address]   Phone Numbers:   Home Phone [phone]  Mobile [phone]     Emergency Contact Information on File:   Contact Information    Name Relation Home Work [place], [place] Sister   [phone]   J[place], Pho   [phone]      The above family members may be contacted for information on this patient (review DPR on file):  Yes    Patient Clinical Information:  Primary Care Provider:  [name], NP  Primary Cardiologist:  Candee Furbish, MD  Primary Electrophysiologist:  None   Past Medical Hx: [name]  has a past medical history of Anemia, Arthritis, CHF (congestive heart failure) ([place]), Chronic hyperventilation syndrome, COPD (chronic obstructive pulmonary disease) ([place]), Gallstones, GERD (gastroesophageal reflux disease), H/O hiatal hernia, Hypertension, Hypothyroidism, Kidney stones, Morbid obesity ([place]), Pneumonia, Sarcoidosis, Seasonal allergies, and Sleep apnea.   Allergies: She is allergic to tape.   Medications: Current Outpatient Medications on File Prior to Visit  Medication Sig  . albuterol (PROAIR HFA) 108 (90 Base) MCG/ACT inhaler Inhale 1-2 puffs into the lungs every 6 (six) hours as needed for wheezing or shortness of breath.  Marland Kitchen atorvastatin (LIPITOR) 40 MG tablet TAKE 1 TABLET (40 MG TOTAL) BY MOUTH DAILY AT 6 PM.  . B-D TB SYRINGE 1CC/27GX1/2" 27G X 1/2" 1 ML MISC USE ONCE A WEEK TO INJECT METHOTREXATE  .  calcium citrate-vitamin D (CITRACAL+D) 315-200 MG-UNIT tablet Take 1 tablet by mouth daily.  . cetirizine (ZYRTEC) 10 MG tablet Take 10 mg by mouth at bedtime as needed for allergies.  . famotidine (PEPCID) 40 MG tablet TAKE 1 TABLET BY MOUTH TWICE A DAY  . folic acid (FOLVITE) 1 MG tablet Take 1 mg by mouth daily.  Marland Kitchen HYDROcodone-acetaminophen (NORCO) 10-325 MG tablet Take 1 tablet by mouth every 6 (six) hours as needed (pain).  Marland Kitchen losartan (COZAAR) 100 MG tablet TAKE 1 TABLET (100 MG TOTAL) DAILY BY MOUTH.  . methotrexate 50 MG/2ML injection Inject 20 mg into the vein once a week. Dose is 0.8 mL (20mg ). Take on Fridays  . metoprolol succinate (TOPROL-XL) 25 MG 24 hr tablet TAKE 1 TABLET BY MOUTH EVERY DAY (Patient taking differently: Take 25 mg by mouth daily.)  . NON FORMULARY 2 liter of oxygen  . predniSONE (DELTASONE) 10 MG tablet Take 1 tablet (10 mg total) by mouth daily.  Marland Kitchen torsemide (DEMADEX) 20 MG tablet Take 20 mg by mouth daily. Take two tablets ( 40 mg) for increase wt gain of 3 lbs in one day or 5 lbs in one week.  . triamcinolone ointment (KENALOG) 0.1 % Apply 1 application topically 2 (two) times daily. For Sarcoidosis  . vitamin B-12 1000 MCG tablet Take 1 tablet (1,000 mcg total) by mouth daily.   No current facility-administered medications on file prior to visit.     Social Hx: She  reports that she has never smoked. She  has never used smokeless tobacco. She reports current alcohol use. She reports that she does not use drugs.    Diagnosis/Reason for Visit:   LAB WORK NEEDED  Services Requested:  Labs:  BMET  # of Visits Needed/Frequency per Week: 1 VISIT TO BE DONE IN 1-2 WEEKS PER PROVIDER Kim McDANIEL, NP

## 2020-11-25 NOTE — Telephone Encounter (Signed)
Cecille Rubin from [employer] is calling to let Arbie Cookey know that this patient doesn't meet the qualifications for their services due to her age and her not being a part of their program. Please advise.

## 2020-12-04 ENCOUNTER — Other Ambulatory Visit: Payer: Self-pay | Admitting: Adult Health

## 2020-12-04 ENCOUNTER — Ambulatory Visit (INDEPENDENT_AMBULATORY_CARE_PROVIDER_SITE_OTHER): Payer: Medicare Other

## 2020-12-04 DIAGNOSIS — Z0000 Encounter for general adult medical examination without abnormal findings: Secondary | ICD-10-CM

## 2020-12-04 MED ORDER — ALBUTEROL SULFATE HFA 108 (90 BASE) MCG/ACT IN AERS
1.0000 | INHALATION_SPRAY | Freq: Four times a day (QID) | RESPIRATORY_TRACT | 3 refills | Status: DC | PRN
Start: 2020-12-04 — End: 2021-01-11

## 2020-12-04 NOTE — Telephone Encounter (Signed)
I dont think I need to order any labs  We will order cologuard   Albuterol inhaler sent in

## 2020-12-04 NOTE — Telephone Encounter (Signed)
During medicare wellness visit today patient informed me that her cardiologist Dr. [name] was going to be drawing some labs on her next week and has set up the labs to be drawn at her home. She wanted to know if you would like to add any additional labs that day as well. Also she would like to know if we could order another cologuard test for her as well as send in a refill on her ventolin inhaler. Please advise?

## 2020-12-04 NOTE — Progress Notes (Addendum)
Subjective:   [name] is a 55 y.o. female who presents for Medicare Annual (Subsequent) preventive examination.  Virtual Visit via Video Note  I connected with [name]  by a video enabled telemedicine application and verified that I am speaking with the correct person using two identifiers.  Location: Patient: Home Provider: Office Persons participating in the virtual visit: patient, provider   I discussed the limitations of evaluation and management by telemedicine and the availability of in person appointments. The patient expressed understanding and agreed to proceed.     [place]    Review of Systems    N/A Cardiac Risk Factors include: hypertension;obesity (BMI >30kg/m2)     Objective:    Today's Vitals   12/04/20 0938  PainSc: 5    There is no height or weight on file to calculate BMI.  Advanced Directives 12/04/2020 08/11/2020 08/10/2020 12/05/2019 11/06/2019 08/17/2019 05/08/2019  Does Patient Have a Medical Advance Directive? Yes Yes No Yes Yes No No  Type of Paramedic of [place];Living will [place];Living will - [place];Living will Living will - -  Does patient want to make changes to medical advance directive? - No - Patient declined - No - Patient declined Yes (ED - Information included in AVS) - -  Copy of [place] in Chart? Yes - validated most recent copy scanned in chart (See row information) No - copy requested - Yes - validated most recent copy scanned in chart (See row information) - - -  Would patient like information on creating a medical advance directive? - No - Patient declined No - Patient declined - - No - Patient declined No - Patient declined  Pre-existing out of facility DNR order (yellow form or pink MOST form) - - - - - - -    Current Medications (verified) Outpatient Encounter Medications as of 12/04/2020  Medication Sig  .  albuterol (PROAIR HFA) 108 (90 Base) MCG/ACT inhaler Inhale 1-2 puffs into the lungs every 6 (six) hours as needed for wheezing or shortness of breath.  Marland Kitchen atorvastatin (LIPITOR) 40 MG tablet TAKE 1 TABLET (40 MG TOTAL) BY MOUTH DAILY AT 6 PM.  . B-D TB SYRINGE 1CC/27GX1/2" 27G X 1/2" 1 ML MISC USE ONCE A WEEK TO INJECT METHOTREXATE  . calcium citrate-vitamin D (CITRACAL+D) 315-200 MG-UNIT tablet Take 1 tablet by mouth daily.  . cetirizine (ZYRTEC) 10 MG tablet Take 10 mg by mouth at bedtime as needed for allergies.  . famotidine (PEPCID) 40 MG tablet TAKE 1 TABLET BY MOUTH TWICE A DAY  . folic acid (FOLVITE) 1 MG tablet Take 1 mg by mouth daily.  Marland Kitchen HYDROcodone-acetaminophen (NORCO) 10-325 MG tablet Take 1 tablet by mouth every 6 (six) hours as needed (pain).  Marland Kitchen losartan (COZAAR) 100 MG tablet TAKE 1 TABLET (100 MG TOTAL) DAILY BY MOUTH.  . methotrexate 50 MG/2ML injection Inject 20 mg into the vein once a week. Dose is 0.8 mL (20mg ). Take on Fridays  . metoprolol succinate (TOPROL-XL) 25 MG 24 hr tablet TAKE 1 TABLET BY MOUTH EVERY DAY (Patient taking differently: Take 25 mg by mouth daily.)  . NON FORMULARY 2 liter of oxygen  . predniSONE (DELTASONE) 10 MG tablet Take 1 tablet (10 mg total) by mouth daily.  Marland Kitchen torsemide (DEMADEX) 20 MG tablet Take 20 mg by mouth daily. Take two tablets ( 40 mg) for increase wt gain of 3 lbs in one day or  5 lbs in one week.  . triamcinolone ointment (KENALOG) 0.1 % Apply 1 application topically 2 (two) times daily. For Sarcoidosis  . vitamin B-12 1000 MCG tablet Take 1 tablet (1,000 mcg total) by mouth daily.   No facility-administered encounter medications on file as of 12/04/2020.    Allergies (verified) Tape   History: Past Medical History:  Diagnosis Date  . Anemia   . Arthritis    hands, shoulders, no meds  . CHF (congestive heart failure) (HCC)    EF60-65%  . Chronic hyperventilation syndrome    w/ obesity tx with albuterol inhaler and oxygen 2L   . COPD (chronic obstructive pulmonary disease) ([place])    uses oxygen 2 L  . Gallstones   . GERD (gastroesophageal reflux disease)    diet controlled - no meds  . H/O hiatal hernia   . Hypertension   . Hypothyroidism   . Kidney stones   . Morbid obesity ([place])   . Pneumonia    hoispitalized in 08/2011  . Sarcoidosis   . Seasonal allergies   . Sleep apnea    uses CPAP machine    Past Surgical History:  Procedure Laterality Date  . [place]   x 1  . CHOLECYSTECTOMY  2000  . DILATION AND CURETTAGE OF UTERUS N/A 08/09/2016   Procedure: DILATATION AND CURETTAGE;  Surgeon: Everitt Amber, MD;  Location: [facility];  Service: Gynecology;  Laterality: N/A;  . HYSTEROSCOPY WITH D & C  12/27/2011   Procedure: DILATATION AND CURETTAGE /HYSTEROSCOPY;  Surgeon: Maeola Sarah. Landry Mellow, MD;  Location: [facility];  Service: Gynecology;;  . I and D of abcess  05/2011  . INTRAUTERINE DEVICE (IUD) INSERTION N/A 08/09/2016   Procedure: INTRAUTERINE DEVICE (IUD) INSERTION;  Surgeon: Everitt Amber, MD;  Location: [facility];  Service: Gynecology;  Laterality: N/A;  . LUNG BIOPSY    . uterine abletion     Family History  Problem Relation Age of Onset  . Diabetes Father   . Cancer Father 18       colon cancer   . Diabetes Brother   . Deep vein thrombosis Mother   . Aneurysm Sister        d/o brain aneurysm   Social History   Socioeconomic History  . Marital status: Single    Spouse name: Not on file  . Number of children: 1  . Years of education: 74  . Highest education level: Bachelor's degree (e.g., BA, AB, BS)  Occupational History  . Occupation: disability   Tobacco Use  . Smoking status: Never Smoker  . Smokeless tobacco: Never Used  Vaping Use  . Vaping Use: Never used  Substance and Sexual Activity  . Alcohol use: Yes    Comment: occasionally  . Drug use: No  . Sexual activity: Never    Birth control/protection: None  Other Topics Concern  . Not on file  Social History Narrative   Son  lives with patient.   Divorced for eight or nine years   disabled   Social Determinants of Radio broadcast assistant Strain: Low Risk   . Difficulty of Paying Living Expenses: Not hard at all  Food Insecurity: No Food Insecurity  . Worried About Charity fundraiser in the Last Year: Never true  . Ran Out of Food in the Last Year: Never true  Transportation Needs: No Transportation Needs  . Lack of Transportation (Medical): No  . Lack of Transportation (Non-Medical): No  Physical  Activity: Inactive  . Days of Exercise per Week: 0 days  . Minutes of Exercise per Session: 0 min  Stress: No Stress Concern Present  . Feeling of Stress : Not at all  Social Connections: Socially Isolated  . Frequency of Communication with Friends and Family: More than three times a week  . Frequency of Social Gatherings with Friends and Family: More than three times a week  . Attends Religious Services: Never  . Active Member of Clubs or Organizations: No  . Attends Archivist Meetings: Never  . Marital Status: Divorced    Tobacco Counseling Counseling given: Not Answered   Clinical Intake:  Pre-visit preparation completed: Yes  Pain : 0-10 Pain Score: 5  Pain Type: Chronic pain Pain Location:  (joints) Pain Orientation:  (Diffused) Pain Descriptors / Indicators: Aching Pain Onset: More than a month ago Pain Frequency: Constant Pain Relieving Factors: Moving around, Pain medication  Pain Relieving Factors: Moving around, Pain medication  Nutritional Status: BMI > 30  Obese Nutritional Risks: Nausea/ vomitting/ diarrhea (diarrhea) Diabetes: No  How often do you need to have someone help you when you read instructions, pamphlets, or other written materials from your doctor or pharmacy?: 1 - Never What is the last grade level you completed in school?: Masters Degree  Diabetic?No  Interpreter Needed?: No  Information entered by :: [place] [facility]  Living In your present state of health, do you have any difficulty performing the following activities: 12/04/2020 08/11/2020  Hearing? Y N  Comment has some issues with certain pitches -  Vision? Y Y  Comment needs cataract extraction has visual issues cataracts in both eyes  Difficulty concentrating or making decisions? N N  Walking or climbing stairs? Y Y  Dressing or bathing? N Y  Doing errands, shopping? N Y  Conservation officer, nature and eating ? Y -  Using the Toilet? N -  In the past six months, have you accidently leaked urine? Y -  Do you have problems with loss of bowel control? N -  Managing your Medications? N -  Managing your Finances? N -  Housekeeping or managing your Housekeeping? N -  Some recent data might be hidden    Patient Care Team: [name], NP as PCP - General (Family Medicine) Jerline Pain, MD as PCP - Cardiology (Cardiology) Wonda Horner, MD (Gastroenterology) Rigoberto Noel, MD as Consulting Physician (Pulmonary Disease) Gavin Pound, MD as Consulting Physician (Rheumatology)  Indicate any recent Medical Services you may have received from other than [facility] providers in the past year (date may be approximate).     Assessment:   This is a routine wellness examination for [name].  Hearing/Vision screen  Hearing Screening   125Hz  250Hz  500Hz  1000Hz  2000Hz  3000Hz  4000Hz  6000Hz  8000Hz   Right ear:           Left ear:           Vision Screening Comments: Patient states has not had eye examined in 2-3 years. Has bilateral cataracts that need removal   Dietary issues and exercise activities discussed: Current Exercise Habits: The patient does not participate in regular exercise at present, Exercise limited by: orthopedic condition(s);Other - see comments;respiratory conditions(s)  Goals    . Decrease inpatient Heart Failure admissions/ readmissions with in the year      Depression Screen PHQ 2/9 Scores 12/04/2020 11/06/2019 05/04/2018 02/24/2016  02/24/2016 02/24/2016  PHQ - 2 Score 0 0 0 0 0 0  PHQ- 9 Score 4 - - - - -    Fall Risk Fall Risk  12/04/2020 11/06/2019 05/04/2018 02/24/2016 02/24/2016  Falls in the past year? 0 0 No No No  Number falls in past yr: 0 - - - -  Injury with Fall? 0 - - - -  Risk for fall due to : Impaired balance/gait;Impaired mobility Medication side effect - - -  Follow up Falls evaluation completed;Falls prevention discussed - - - -    FALL RISK PREVENTION PERTAINING TO THE HOME:  Any stairs in or around the home? No  If so, are there any without handrails? No  Home free of loose throw rugs in walkways, pet beds, electrical cords, etc? Yes  Adequate lighting in your home to reduce risk of falls? Yes   ASSISTIVE DEVICES UTILIZED TO PREVENT FALLS:  Life alert? No  Use of a cane, walker or w/c? Yes  Grab bars in the bathroom? Yes  Shower chair or bench in shower? Yes  Elevated toilet seat or a handicapped toilet? Yes    Cognitive Function:   Normal cognitive status assessed by direct observation by this Nurse Health Advisor. No abnormalities found.     6CIT Screen 11/06/2019  What Year? 0 points  What month? 0 points  What time? 0 points  Count back from 20 0 points  Months in reverse 0 points  Repeat phrase 0 points  Total Score 0    Immunizations Immunization History  Administered Date(s) Administered  . Influenza Split 08/22/2011  . Influenza Whole 07/31/2012  . Influenza,inj,Quad PF,6+ Mos 09/16/2013, 07/11/2014, 07/11/2015, 09/04/2017, 11/07/2018, 08/19/2019, 08/13/2020  . Influenza-Unspecified 08/18/2016  . PFIZER(Purple Top)SARS-COV-2 Vaccination 01/16/2020, 02/06/2020, 11/03/2020  . Pneumococcal Polysaccharide-23 08/19/2019  . Pneumococcal-Unspecified 08/01/2011    TDAP status: Due, Education has been provided regarding the importance of this vaccine. Advised may receive this vaccine at local pharmacy or Health Dept. Aware to provide a copy of the vaccination record if obtained from  local pharmacy or Health Dept. Verbalized acceptance and understanding.  Flu Vaccine status: Up to date  Pneumococcal vaccine status: Up to date  Covid-19 vaccine status: Completed vaccines  Qualifies for Shingles Vaccine? Yes   Zostavax completed No   Shingrix Completed?: No.    Education has been provided regarding the importance of this vaccine. Patient has been advised to call insurance company to determine out of pocket expense if they have not yet received this vaccine. Advised may also receive vaccine at local pharmacy or Health Dept. Verbalized acceptance and understanding.  Screening Tests Health Maintenance  Topic Date Due  . Hepatitis C Screening  Never done  . TETANUS/TDAP  Never done  . COLONOSCOPY (Pts 45-3yrs Insurance coverage will need to be confirmed)  Never done  . MAMMOGRAM  Never done  . PAP SMEAR-Modifier  11/01/2019  . COVID-19 Vaccine  Completed  . HIV Screening  Completed    Health Maintenance  Health Maintenance Due  Topic Date Due  . Hepatitis C Screening  Never done  . TETANUS/TDAP  Never done  . COLONOSCOPY (Pts 45-32yrs Insurance coverage will need to be confirmed)  Never done  . MAMMOGRAM  Never done  . PAP SMEAR-Modifier  11/01/2019    Colorectal cancer Screening: Patient would like a new cologuard testing to be sent to her home   Mammogram screening: Patient has declined at this time. States that she would like to schedule in the future, however now due to mobility issues she would like to  hold off until she can get an electric wheel chair  Bone Density Status: Not due at this age   Lung Cancer Screening: (Low Dose CT Chest recommended if Age 21-80 years, 30 pack-year currently smoking OR have quit w/in 15years.) does not qualify.   Lung Cancer Screening Referral: N/A   Additional Screening:  Hepatitis C Screening: does qualify;   Vision Screening: Recommended annual ophthalmology exams for early detection of glaucoma and other  disorders of the eye. Is the patient up to date with their annual eye exam?  No  Who is the provider or what is the name of the office in which the patient attends annual eye exams? Dr.Bernstof  If pt is not established with a provider, would they like to be referred to a provider to establish care? No .   Dental Screening: Recommended annual dental exams for proper oral hygiene  Community Resource Referral / Chronic Care Management: CRR required this visit?  No   CCM required this visit?  No      Plan:     I have personally reviewed and noted the following in the patient's chart:   . Medical and social history . Use of alcohol, tobacco or illicit drugs  . Current medications and supplements . Functional ability and status . Nutritional status . Physical activity . Advanced directives . List of other physicians . Hospitalizations, surgeries, and ER visits in previous 12 months . Vitals . Screenings to include cognitive, depression, and falls . Referrals and appointments  In addition, I have reviewed and discussed with patient certain preventive protocols, quality metrics, and best practice recommendations. A written personalized care plan for preventive services as well as general preventive health recommendations were provided to patient.     Ofilia Neas, LPN   [id number]   Nurse Notes: Patient has some barriers due to accessibility issues. She has deferred some screening exams due to mobility issues as well as respiratory problems.  She has difficulties going to appointments due to knee pain, and respiratory issues. She feels that she would benefit from an electric wheelchair.

## 2020-12-04 NOTE — Patient Instructions (Signed)
[name] , Thank you for taking time to come for your Medicare Wellness Visit. I appreciate your ongoing commitment to your health goals. Please review the following plan we discussed and let me know if I can assist you in the future.   Screening recommendations/referrals: Colonoscopy: Currently due for Colon cancer screening. We will get a new cologuard kit sent to you.  Mammogram: Currently due, Please let us know when you would like for [place] to get this scheduled for you. Bone Density: Not due until age 68 Recommended yearly ophthalmology/optometry visit for glaucoma screening and checkup Recommended yearly dental visit for hygiene and checkup  Vaccinations: Influenza vaccine: Up to date, next due fall 2022  Pneumococcal vaccine: Up to date, next due at age 80 or by recommendation by provider  Tdap vaccine: Currently due, if you wish to receive you may await and injury to do so when it will be covered by your insurance. Shingles vaccine: Currently due for Shingrix, if you wish to receive we recommend that you do so at your local pharmacy.     Advanced directives: Copies on file   Conditions/risks identified: I will discuss with your [name] your accessibility issues so that we may work on getting you and electric wheelchair  Next appointment: None   Preventive Care 40-64 Years, Female Preventive care refers to lifestyle choices and visits with your health care provider that can promote health and wellness. What does preventive care include?  A yearly physical exam. This is also called an annual well check.  Dental exams once or twice a year.  Routine eye exams. Ask your health care provider how often you should have your eyes checked.  Personal lifestyle choices, including:  Daily care of your teeth and gums.  Regular physical activity.  Eating a healthy diet.  Avoiding tobacco and drug use.  Limiting alcohol use.  Practicing safe sex.  Taking low-dose aspirin  daily starting at age 76.  Taking vitamin and mineral supplements as recommended by your health care provider. What happens during an annual well check? The services and screenings done by your health care provider during your annual well check will depend on your age, overall health, lifestyle risk factors, and family history of disease. Counseling  Your health care provider may ask you questions about your:  Alcohol use.  Tobacco use.  Drug use.  Emotional well-being.  Home and relationship well-being.  Sexual activity.  Eating habits.  Work and work Statistician.  Method of birth control.  Menstrual cycle.  Pregnancy history. Screening  You may have the following tests or measurements:  Height, weight, and BMI.  Blood pressure.  Lipid and cholesterol levels. These may be checked every 5 years, or more frequently if you are over 82 years old.  Skin check.  Lung cancer screening. You may have this screening every year starting at age 65 if you have a 30-pack-year history of smoking and currently smoke or have quit within the past 15 years.  Fecal occult blood test (FOBT) of the stool. You may have this test every year starting at age 77.  Flexible sigmoidoscopy or colonoscopy. You may have a sigmoidoscopy every 5 years or a colonoscopy every 10 years starting at age 45.  Hepatitis C blood test.  Hepatitis B blood test.  Sexually transmitted disease (STD) testing.  Diabetes screening. This is done by checking your blood sugar (glucose) after you have not eaten for a while (fasting). You may have this done every 1-3  years.  Mammogram. This may be done every 1-2 years. Talk to your health care provider about when you should start having regular mammograms. This may depend on whether you have a family history of breast cancer.  BRCA-related cancer screening. This may be done if you have a family history of breast, ovarian, tubal, or peritoneal cancers.  Pelvic  exam and Pap test. This may be done every 3 years starting at age 78. Starting at age 27, this may be done every 5 years if you have a Pap test in combination with an HPV test.  Bone density scan. This is done to screen for osteoporosis. You may have this scan if you are at high risk for osteoporosis. Discuss your test results, treatment options, and if necessary, the need for more tests with your health care provider. Vaccines  Your health care provider may recommend certain vaccines, such as:  Influenza vaccine. This is recommended every year.  Tetanus, diphtheria, and acellular pertussis (Tdap, Td) vaccine. You may need a Td booster every 10 years.  Zoster vaccine. You may need this after age 90.  Pneumococcal 13-valent conjugate (PCV13) vaccine. You may need this if you have certain conditions and were not previously vaccinated.  Pneumococcal polysaccharide (PPSV23) vaccine. You may need one or two doses if you smoke cigarettes or if you have certain conditions. Talk to your health care provider about which screenings and vaccines you need and how often you need them. This information is not intended to replace advice given to you by your health care provider. Make sure you discuss any questions you have with your health care provider. Document Released: 11/13/2015 Document Revised: 07/06/2016 Document Reviewed: 08/18/2015 Elsevier Interactive Patient Education  2017 [place] Prevention in the Home Falls can cause injuries. They can happen to people of all ages. There are many things you can do to make your home safe and to help prevent falls. What can I do on the outside of my home?  Regularly fix the edges of walkways and driveways and fix any cracks.  Remove anything that might make you trip as you walk through a door, such as a raised step or threshold.  Trim any bushes or trees on the path to your home.  Use bright outdoor lighting.  Clear any walking paths  of anything that might make someone trip, such as rocks or tools.  Regularly check to see if handrails are loose or broken. Make sure that both sides of any steps have handrails.  Any raised decks and porches should have guardrails on the edges.  Have any leaves, snow, or ice cleared regularly.  Use sand or salt on walking paths during winter.  Clean up any spills in your garage right away. This includes oil or grease spills. What can I do in the bathroom?  Use night lights.  Install grab bars by the toilet and in the tub and shower. Do not use towel bars as grab bars.  Use non-skid mats or decals in the tub or shower.  If you need to sit down in the shower, use a plastic, non-slip stool.  Keep the floor dry. Clean up any water that spills on the floor as soon as it happens.  Remove soap buildup in the tub or shower regularly.  Attach bath mats securely with double-sided non-slip rug tape.  Do not have throw rugs and other things on the floor that can make you trip. What can I do in  the bedroom?  Use night lights.  Make sure that you have a light by your bed that is easy to reach.  Do not use any sheets or blankets that are too big for your bed. They should not hang down onto the floor.  Have a firm chair that has side arms. You can use this for support while you get dressed.  Do not have throw rugs and other things on the floor that can make you trip. What can I do in the kitchen?  Clean up any spills right away.  Avoid walking on wet floors.  Keep items that you use a lot in easy-to-reach places.  If you need to reach something above you, use a strong step stool that has a grab bar.  Keep electrical cords out of the way.  Do not use floor polish or wax that makes floors slippery. If you must use wax, use non-skid floor wax.  Do not have throw rugs and other things on the floor that can make you trip. What can I do with my stairs?  Do not leave any items on  the stairs.  Make sure that there are handrails on both sides of the stairs and use them. Fix handrails that are broken or loose. Make sure that handrails are as long as the stairways.  Check any carpeting to make sure that it is firmly attached to the stairs. Fix any carpet that is loose or worn.  Avoid having throw rugs at the top or bottom of the stairs. If you do have throw rugs, attach them to the floor with carpet tape.  Make sure that you have a light switch at the top of the stairs and the bottom of the stairs. If you do not have them, ask someone to add them for you. What else can I do to help prevent falls?  Wear shoes that:  Do not have high heels.  Have rubber bottoms.  Are comfortable and fit you well.  Are closed at the toe. Do not wear sandals.  If you use a stepladder:  Make sure that it is fully opened. Do not climb a closed stepladder.  Make sure that both sides of the stepladder are locked into place.  Ask someone to hold it for you, if possible.  Clearly mark and make sure that you can see:  Any grab bars or handrails.  First and last steps.  Where the edge of each step is.  Use tools that help you move around (mobility aids) if they are needed. These include:  Canes.  Walkers.  Scooters.  Crutches.  Turn on the lights when you go into a dark area. Replace any light bulbs as soon as they burn out.  Set up your furniture so you have a clear path. Avoid moving your furniture around.  If any of your floors are uneven, fix them.  If there are any pets around you, be aware of where they are.  Review your medicines with your doctor. Some medicines can make you feel dizzy. This can increase your chance of falling. Ask your doctor what other things that you can do to help prevent falls. This information is not intended to replace advice given to you by your health care provider. Make sure you discuss any questions you have with your health care  provider. Document Released: 08/13/2009 Document Revised: 03/24/2016 Document Reviewed: 11/21/2014 Elsevier Interactive Patient Education  2017 [employer].

## 2020-12-08 ENCOUNTER — Inpatient Hospital Stay: Payer: Medicare Other | Attending: Hematology

## 2020-12-09 DIAGNOSIS — G4733 Obstructive sleep apnea (adult) (pediatric): Secondary | ICD-10-CM | POA: Diagnosis not present

## 2020-12-10 NOTE — Telephone Encounter (Signed)
Spoke with patient and informed her that no labs are needed from [facility]. Also informed her of refill and cologuard. Patient verbalized understanding

## 2020-12-11 DIAGNOSIS — G4733 Obstructive sleep apnea (adult) (pediatric): Secondary | ICD-10-CM | POA: Diagnosis not present

## 2020-12-29 ENCOUNTER — Other Ambulatory Visit: Payer: Self-pay | Admitting: Adult Health

## 2020-12-29 DIAGNOSIS — E785 Hyperlipidemia, unspecified: Secondary | ICD-10-CM

## 2021-01-07 DIAGNOSIS — M159 Polyosteoarthritis, unspecified: Secondary | ICD-10-CM | POA: Diagnosis not present

## 2021-01-07 DIAGNOSIS — G894 Chronic pain syndrome: Secondary | ICD-10-CM | POA: Diagnosis not present

## 2021-01-07 DIAGNOSIS — M064 Inflammatory polyarthropathy: Secondary | ICD-10-CM | POA: Diagnosis not present

## 2021-01-07 DIAGNOSIS — Z79899 Other long term (current) drug therapy: Secondary | ICD-10-CM | POA: Diagnosis not present

## 2021-01-07 DIAGNOSIS — E611 Iron deficiency: Secondary | ICD-10-CM | POA: Diagnosis not present

## 2021-01-07 DIAGNOSIS — D860 Sarcoidosis of lung: Secondary | ICD-10-CM | POA: Diagnosis not present

## 2021-01-07 DIAGNOSIS — R0602 Shortness of breath: Secondary | ICD-10-CM | POA: Diagnosis not present

## 2021-01-08 DIAGNOSIS — G4733 Obstructive sleep apnea (adult) (pediatric): Secondary | ICD-10-CM | POA: Diagnosis not present

## 2021-01-10 ENCOUNTER — Other Ambulatory Visit: Payer: Self-pay | Admitting: Adult Health

## 2021-01-28 DIAGNOSIS — J441 Chronic obstructive pulmonary disease with (acute) exacerbation: Secondary | ICD-10-CM | POA: Diagnosis not present

## 2021-01-28 DIAGNOSIS — R918 Other nonspecific abnormal finding of lung field: Secondary | ICD-10-CM | POA: Diagnosis not present

## 2021-01-28 DIAGNOSIS — I5030 Unspecified diastolic (congestive) heart failure: Secondary | ICD-10-CM | POA: Diagnosis not present

## 2021-01-28 DIAGNOSIS — D869 Sarcoidosis, unspecified: Secondary | ICD-10-CM | POA: Diagnosis not present

## 2021-01-28 DIAGNOSIS — M1712 Unilateral primary osteoarthritis, left knee: Secondary | ICD-10-CM | POA: Diagnosis not present

## 2021-02-08 DIAGNOSIS — G4733 Obstructive sleep apnea (adult) (pediatric): Secondary | ICD-10-CM | POA: Diagnosis not present

## 2021-02-28 DIAGNOSIS — J441 Chronic obstructive pulmonary disease with (acute) exacerbation: Secondary | ICD-10-CM | POA: Diagnosis not present

## 2021-02-28 DIAGNOSIS — D869 Sarcoidosis, unspecified: Secondary | ICD-10-CM | POA: Diagnosis not present

## 2021-02-28 DIAGNOSIS — I5030 Unspecified diastolic (congestive) heart failure: Secondary | ICD-10-CM | POA: Diagnosis not present

## 2021-02-28 DIAGNOSIS — R918 Other nonspecific abnormal finding of lung field: Secondary | ICD-10-CM | POA: Diagnosis not present

## 2021-02-28 DIAGNOSIS — M1712 Unilateral primary osteoarthritis, left knee: Secondary | ICD-10-CM | POA: Diagnosis not present

## 2021-03-08 ENCOUNTER — Other Ambulatory Visit: Payer: Self-pay

## 2021-03-08 ENCOUNTER — Inpatient Hospital Stay: Payer: Medicare Other | Attending: Hematology

## 2021-03-08 ENCOUNTER — Telehealth: Payer: Self-pay | Admitting: Hematology

## 2021-03-08 DIAGNOSIS — D500 Iron deficiency anemia secondary to blood loss (chronic): Secondary | ICD-10-CM

## 2021-03-08 NOTE — Telephone Encounter (Signed)
Called pt to r/s lab per 5/9 sch msg. Pt asked if lab could be done at home. Forwarded call to desk nurse to help answer pt's question.

## 2021-03-10 DIAGNOSIS — G4733 Obstructive sleep apnea (adult) (pediatric): Secondary | ICD-10-CM | POA: Diagnosis not present

## 2021-04-08 ENCOUNTER — Telehealth: Payer: Self-pay | Admitting: Adult Health

## 2021-04-08 NOTE — Telephone Encounter (Signed)
The patient needs a disability placard form completed   Call patient to pick up at [phone]  Disposition: Dr's folder

## 2021-04-09 DIAGNOSIS — G4733 Obstructive sleep apnea (adult) (pediatric): Secondary | ICD-10-CM | POA: Diagnosis not present

## 2021-04-09 NOTE — Telephone Encounter (Signed)
Forms has been completed and copied to be placed in pt chart. Called pt to pick forms up pt is aware. No further action needed!

## 2021-04-09 NOTE — Telephone Encounter (Signed)
Form placed in front office filing cabinet

## 2021-04-10 DIAGNOSIS — G4733 Obstructive sleep apnea (adult) (pediatric): Secondary | ICD-10-CM | POA: Diagnosis not present

## 2021-04-29 DIAGNOSIS — R918 Other nonspecific abnormal finding of lung field: Secondary | ICD-10-CM | POA: Diagnosis not present

## 2021-04-29 DIAGNOSIS — I5030 Unspecified diastolic (congestive) heart failure: Secondary | ICD-10-CM | POA: Diagnosis not present

## 2021-04-29 DIAGNOSIS — D869 Sarcoidosis, unspecified: Secondary | ICD-10-CM | POA: Diagnosis not present

## 2021-04-29 DIAGNOSIS — J441 Chronic obstructive pulmonary disease with (acute) exacerbation: Secondary | ICD-10-CM | POA: Diagnosis not present

## 2021-04-29 DIAGNOSIS — M1712 Unilateral primary osteoarthritis, left knee: Secondary | ICD-10-CM | POA: Diagnosis not present

## 2021-05-10 DIAGNOSIS — G4733 Obstructive sleep apnea (adult) (pediatric): Secondary | ICD-10-CM | POA: Diagnosis not present

## 2021-05-24 ENCOUNTER — Telehealth: Payer: Self-pay | Admitting: Adult Health

## 2021-05-24 NOTE — Telephone Encounter (Signed)
Pt is calling in to stating that she sees a rheumatologist and would like to have advice on if she should stop seeing them.  Pt would like to have a call back.

## 2021-05-25 NOTE — Telephone Encounter (Signed)
Pt became upset about the situation and also upset that Tommi Rumps was not the person to return her call. Pt stated that "all he he to do is refill the prescription". I advised pt that it is more to just taking over a prescription and that the prescription she wanted Tommi Rumps does not prescribe anyway. Pt was scheduled to talk to [facility] about situation.

## 2021-05-25 NOTE — Telephone Encounter (Signed)
Pt stated she does not want that medication and her Rheumatologist is about to take her off this. Pt only want Cory to take over hydrocodone. Pt wanting to speak with [facility] directly.

## 2021-05-25 NOTE — Telephone Encounter (Signed)
Pt stated that she cant go into the office due to knee issue. Pt stated that her rheumatologist cannot do vv. Pt stated that they advertise VV but don't offer it. Pt also stated that her joints are painful. Pt stated that the Rheumatologist does knee injections and provides medications. Pt stated that its a hassle to see the provider. The provider stated that the Rheumatologist suggest she see her PCP. Pt wants to know if you can take over medication.   Should I schedule pt for appointment?

## 2021-05-26 ENCOUNTER — Telehealth: Payer: Self-pay | Admitting: Pulmonary Disease

## 2021-05-26 NOTE — Telephone Encounter (Signed)
Is it ok to change to Video Visit?  TP please advise  Thank you

## 2021-05-26 NOTE — Telephone Encounter (Signed)
Pt was able to speak with [facility]. No further actions needed!

## 2021-05-27 ENCOUNTER — Telehealth: Payer: Medicare Other | Admitting: Adult Health

## 2021-05-30 DIAGNOSIS — D869 Sarcoidosis, unspecified: Secondary | ICD-10-CM | POA: Diagnosis not present

## 2021-05-30 DIAGNOSIS — J441 Chronic obstructive pulmonary disease with (acute) exacerbation: Secondary | ICD-10-CM | POA: Diagnosis not present

## 2021-05-30 DIAGNOSIS — R918 Other nonspecific abnormal finding of lung field: Secondary | ICD-10-CM | POA: Diagnosis not present

## 2021-05-30 DIAGNOSIS — I5030 Unspecified diastolic (congestive) heart failure: Secondary | ICD-10-CM | POA: Diagnosis not present

## 2021-05-30 DIAGNOSIS — M1712 Unilateral primary osteoarthritis, left knee: Secondary | ICD-10-CM | POA: Diagnosis not present

## 2021-05-31 NOTE — Telephone Encounter (Signed)
Yes if she does not have any other options.

## 2021-05-31 NOTE — Telephone Encounter (Signed)
Spoke with pt who stated understanding of 8/10 visit being changed to My Chart video visit. Pt also wanted to inform Tammy that there are X-rays in Epic from as recent as 07/2020. Visit changed. Nothing further needed at this time.

## 2021-05-31 NOTE — Telephone Encounter (Signed)
[name], I checked with the pt and she states she is unable to physically come in b/c she can not walk much. I advised we have wheel chairs at the door, and she states she can not use these due to her weight. Are you ok with video?

## 2021-05-31 NOTE — Telephone Encounter (Signed)
Would be better for in person, not here since 2020 . May need xray .  If can make in person that is best

## 2021-06-03 ENCOUNTER — Telehealth: Payer: Self-pay | Admitting: Cardiology

## 2021-06-03 NOTE — Telephone Encounter (Signed)
   Pt said when she saw Sharee Pimple, she order a home health nurse for her to take some blood work, she said that never happen and she is following up on it

## 2021-06-04 ENCOUNTER — Telehealth: Payer: Self-pay | Admitting: Adult Health

## 2021-06-04 NOTE — Telephone Encounter (Signed)
Called and spoke with Patient.  Patient stated she has a upcoming virtual OV with Tammy, NP, 06/09/21. Patient wanted Tammy, NP to know that Dr. [name], with [facility] has set up lab draws to be completed at home, with home health. Patient is not sure of what day or what labs they are going to draw. Patient has standing iron and cbc orders. Patient wanted Tammy, NP to know in case she needed to see or add labs.   Message routed to [place], NP as fyi for upcoming OV

## 2021-06-04 NOTE — Telephone Encounter (Signed)
Called and spoke with Patient.  Patient made aware Tammy, NP will not need to order any labs before OV.  Understanding stated.  Nothing further at this time.

## 2021-06-04 NOTE — Telephone Encounter (Signed)
Michae Kava, CMA    2:12 PM Note [place] Visit Follow Up Request    Date of Request ([place]):  November 24, 2020 Requesting Provider:  Kathyrn Drown, NP     Agency Requested:     [facility] Contact:  Glory Buff, NP [address] [place], [place] [address] Phone #:  [phone] Fax #:  [phone]   Patient Demographic Information: Name:  [name] Age:  55 y.o.   DOB:  May 02, 1966  MRN:  [id number]    Home visit progress note(s), lab results, telemetry strips, etc were reviewed.   Provider Recommendations: LAB WORK   Follow up home services requested: Labs:  BMET  TO BE DONE IN THE NEXT 1-2 WEEKS NUMBER OF VISITS 1      No results for the above order.  Will f/u with why order wasn't completed and if pt still requires the lab work.

## 2021-06-04 NOTE — Telephone Encounter (Signed)
No that would not be necessary

## 2021-06-07 ENCOUNTER — Telehealth: Payer: Self-pay

## 2021-06-07 ENCOUNTER — Inpatient Hospital Stay: Payer: Medicare Other | Admitting: Hematology

## 2021-06-07 ENCOUNTER — Inpatient Hospital Stay: Payer: Medicare Other

## 2021-06-07 NOTE — Telephone Encounter (Signed)
Patient calling back. She states she has not received a call back and would like to make sure she receives one. She states she was told the name of the company was [facility], but there is no such company by that name. She would like to know the name of the company that is supposed to do the home health nursing for her so she can contact them.

## 2021-06-07 NOTE — Telephone Encounter (Signed)
Spoke with [place] at [facility] who states the order was denied due to no contract with [facility] for home lab service. [place] states that that information was sent back to [place] via email. I called and advised patient of this information. She states she is unable to get into any office or [facility] location anywhere and asked if [facility] offers home service; she asked me to call and verify the information with [facility].

## 2021-06-07 NOTE — Telephone Encounter (Signed)
Returned call to patient who states she did not get lab work done in February that was ordered by Kathyrn Drown, NP because no one ever contacted her from home health. I reviewed chart and advised that an order was sent to [facility] on 11/25/20 and I do not see any communication regarding why they did not follow-up. I advised I will call them today to inquire. She takes torsemide 20 mg daily with an occasional extra 20 mg for weight gain. Reports she is home bound due to inability to walk more than a few feet. I advised I will call back with plan of care and she thanked me for the call.

## 2021-06-07 NOTE — Telephone Encounter (Signed)
This nurse spoke with patient and verified that she is not taking oral iron because it causes bad GI upset.  States that Dr. Arlyce Dice has setup for in home lab draw.  She states that she will be reaching out to find out what has happened because no one has come to do it yet.  Patient also wants to inform Dr. Burr Medico that she may be going into early menopause because she has not had a menstrual cycle in two months now. Patient states that she will let this clinic know when her labs have been collected.   This nurse will forward information to provider.  No further questions or concerns at this time.

## 2021-06-07 NOTE — Telephone Encounter (Signed)
Returned call to patient and advised that our manager has researched options for mobile lab and there are currently no options without additional home health services. I advised patient to continue her current medications and if in the future she is able to get to a certain lab location, we will be happy to order the lab test. She verbalized understanding and agreement and thanked me for the call.

## 2021-06-09 ENCOUNTER — Telehealth: Payer: Self-pay

## 2021-06-09 ENCOUNTER — Telehealth (INDEPENDENT_AMBULATORY_CARE_PROVIDER_SITE_OTHER): Payer: Medicare Other | Admitting: Adult Health

## 2021-06-09 ENCOUNTER — Encounter: Payer: Self-pay | Admitting: Adult Health

## 2021-06-09 DIAGNOSIS — I5033 Acute on chronic diastolic (congestive) heart failure: Secondary | ICD-10-CM | POA: Diagnosis not present

## 2021-06-09 DIAGNOSIS — D860 Sarcoidosis of lung: Secondary | ICD-10-CM

## 2021-06-09 DIAGNOSIS — G4733 Obstructive sleep apnea (adult) (pediatric): Secondary | ICD-10-CM

## 2021-06-09 DIAGNOSIS — J9611 Chronic respiratory failure with hypoxia: Secondary | ICD-10-CM | POA: Diagnosis not present

## 2021-06-09 DIAGNOSIS — E662 Morbid (severe) obesity with alveolar hypoventilation: Secondary | ICD-10-CM

## 2021-06-09 NOTE — Telephone Encounter (Signed)
Alternate travel information has already been given to [facility].

## 2021-06-09 NOTE — Telephone Encounter (Signed)
Called and spoke to patient about transportation program, will talk with [facility] the referral specialist to discuss other ways of transportation.

## 2021-06-09 NOTE — Patient Instructions (Signed)
Set up Medical transport, needs extra large wheelchair , to get to medical appointments.  Continue on Prednisone '10mg'$  daily .  Low salt diet  Legs elevated  Remains on Oxygen 2l/m .  Continue on BIPAP At bedtime  with oxygen . Follow up with Dr. Elsworth Soho  in 6-8 weeks and As needed   Please contact office for sooner follow up if symptoms do not improve or worsen or seek emergency care

## 2021-06-09 NOTE — Progress Notes (Signed)
Virtual Visit via Video Note  I connected with [name] on 06/09/21 at  9:30 AM EDT by a video enabled telemedicine application and verified that I am speaking with the correct person using two identifiers.  Location: Patient: Home  Provider: Office    I discussed the limitations of evaluation and management by telemedicine and the availability of in person appointments. The patient expressed understanding and agreed to proceed.  History of Present Illness: 55 year old female morbidly obese followed for obstructive sleep apnea and presumed sarcoidosis with bilateral pulmonary infiltrates and hilar and mediastinal lymphadenopathy with associated skin and joint involvement.  Followed by rheumatology on chronic steroids and injectable methotrexate  Today's  video visit is a follow-up for obstructive sleep apnea/OHS and sarcoid Last seen in the office in January 2020.  Patient presents today via video visit.  Patient says that she is having more more difficulties getting out to doctor visits due to her weight and immobility.  She is able to only walk short distances and has transportation issues.  She is working with her primary care provider on a motorized chair.  She does follow with rheumatology had an appointment 3 months ago.  But has been unable to have her most recent follow-up and lab work.  She is on prednisone 10 mg daily.  Previously on injectable methotrexate but currently on hold until she can get lab work.  Last methotrexate was 2 weeks ago.  Patient says she has chronic joint pain.  Patient says she spends the majority of her day in the bed.  She has chronic shortness of breath.  Low activity tolerance.  Gets winded with minimum activity.  Her son does live with her but works full-time.  She does have home care that comes in 2-1/2 hours each day to help.  She remains on oxygen 2 L 24/7.  Currently O2 saturations today are 96% on 2 L.  She denies any known low O2 sats.  She is on  BiPAP at night with oxygen.  She says she wears it every single night never misses any nights.  BiPAP download was requested.  She says she needs help coordinating medical transport.   She says her weight has been going up she is currently up to 502 pounds.  She also has chronic fluid retention.  She says this is been about the same recently.    She denies any hemoptysis, chest pain, abdominal pain or nausea vomiting.  Past Medical History:  Diagnosis Date   Anemia    Arthritis    hands, shoulders, no meds   CHF (congestive heart failure) (HCC)    EF60-65%   Chronic hyperventilation syndrome    w/ obesity tx with albuterol inhaler and oxygen 2L   COPD (chronic obstructive pulmonary disease) (HCC)    uses oxygen 2 L   Gallstones    GERD (gastroesophageal reflux disease)    diet controlled - no meds   H/O hiatal hernia    Hypertension    Hypothyroidism    Kidney stones    Morbid obesity ([place])    Pneumonia    hoispitalized in 08/2011   Sarcoidosis    Seasonal allergies    Sleep apnea    uses CPAP machine    Current Outpatient Medications on File Prior to Visit  Medication Sig Dispense Refill   albuterol (VENTOLIN HFA) 108 (90 Base) MCG/ACT inhaler INHALE 1-2 PUFFS BY MOUTH EVERY 6 HOURS AS NEEDED FOR WHEEZE OR SHORTNESS OF BREATH 8.5 each  3   atorvastatin (LIPITOR) 40 MG tablet TAKE 1 TABLET (40 MG TOTAL) BY MOUTH DAILY AT 6 PM. 90 tablet 0   B-D TB SYRINGE 1CC/27GX1/2" 27G X 1/2" 1 ML MISC USE ONCE A WEEK TO INJECT METHOTREXATE     calcium citrate-vitamin D (CITRACAL+D) 315-200 MG-UNIT tablet Take 1 tablet by mouth daily.     cetirizine (ZYRTEC) 10 MG tablet Take 10 mg by mouth at bedtime as needed for allergies.     famotidine (PEPCID) 40 MG tablet TAKE 1 TABLET BY MOUTH TWICE A DAY 119 tablet 3   folic acid (FOLVITE) 1 MG tablet Take 1 mg by mouth daily.  3   HYDROcodone-acetaminophen (NORCO) 10-325 MG tablet Take 1 tablet by mouth every 6 (six) hours as needed (pain). 30  tablet 0   losartan (COZAAR) 100 MG tablet TAKE 1 TABLET (100 MG TOTAL) DAILY BY MOUTH. 90 tablet 3   methotrexate 50 MG/2ML injection Inject 20 mg into the vein once a week. Dose is 0.8 mL (45m). Take on Fridays     metoprolol succinate (TOPROL-XL) 25 MG 24 hr tablet TAKE 1 TABLET BY MOUTH EVERY DAY (Patient taking differently: Take 25 mg by mouth daily.) 90 tablet 3   NON FORMULARY 2 liter of oxygen     predniSONE (DELTASONE) 10 MG tablet Take 1 tablet (10 mg total) by mouth daily. 10 tablet 0   torsemide (DEMADEX) 20 MG tablet Take 20 mg by mouth daily. Take two tablets ( 40 mg) for increase wt gain of 3 lbs in one day or 5 lbs in one week.     triamcinolone ointment (KENALOG) 0.1 % Apply 1 application topically 2 (two) times daily. For Sarcoidosis     vitamin B-12 1000 MCG tablet Take 1 tablet (1,000 mcg total) by mouth daily. 30 tablet 0   No current facility-administered medications on file prior to visit.         Observations/Objective: Lying in bed on Oxygen -NAD    First seen in hospital 01/26/11  for acute dyspnea and hypoxia found to have OHS and OSA with recs for continuous O2 ( 2 L/m at rest & 4L/m on walking)and Nocturnal CPAP   Echo- mild LVH , EF 55-60% . VQ scan intermed prob , doppler neg. CT chest  neg for PE.   CPAP titration 03/2011 (wt 479 lbs) -CPAP 7 cm to stop snoring, No desaturation on O2 2 L/min    09/2011 - Hosp adm for hemoptysis & BL infiltrates on CT,p-anca pos 1.80, gbm neg, ANA neg, ESR 42   Labs c/w fe def anemia, UA pos blood (was having periods) >> rpt UA no RBCs   Underwent endometrial ablation for menorrhagia -unsuccessful   08/2012 admitted for atypical chest pain, and shortness of breath.   CT chest was negative for PE, notable, hilar and mediastinal lymphadenopathy and cardiomegaly   C-ANCA 1:40, ANA 1:320 , homogenous   ACE level 21   10/02/2012 TBBX >> no granulomas, BAL neg afb, fungal   12/2012 -Was seen by Dr. HTrudie Reedat [facility]   repeat labs>> neg pANCA and cANCA ,ESR 48 and neg ANA , low ACE level     CT chest  12/2013 >decreased hilar /mediastinal adenopathy-(done 1 week after steroids started )  >>>Pred decreased to 555mdaily   CT chest 04/24/14 - Scattered pulmonary opacities with ground-glass densities, clustered  reticulonodular densities and interstitial thickening   06/12/14   Patient reports previous biopsy of, arm,  rash, by dermatology was consistent with sun exposure dermatitis.   10/2014 PFTs -FVC 55%, DLCO 44%     03/09/15 increased joint pain and chest tightness - on prednisone 68m every other day - Increased to 174mdaily , today she took 20 mg because she was having severe joint pains in the morning   Adm 12/2014 for acute resp failure secondary to CHF and PNA superimposed upon underlying OSA/OHS, sarcoidosis, and anemia -required 2 units PRBC for hemoglobin 6.7 CT angiogram chest negative for pulmonary embolus-Revealed scattered  opacities bilateral   She has been on and off prednisone since 12/2013-started for skin rash and joint pains  -more compliant with O2   >steroid burst and rhuematology referral .   Labs showed ANA remains positive with titer 1:1280. Has seen Rheumatology in past (~2103) for possible Sarcoid/pulmonary infiltrates  and positive ANA /c-ANCA referred back to rheumatology and has office visit in July 2016     Assessment and Plan: Sarcoidosis and polyarthritis.  Patient is continue follow-up with rheumatology.  For now continue on prednisone.  We are going to try to work to see if we can help assist with medical transport so she can get to her medical appointments.  Obstructive sleep apnea/OHS.  Continue with BiPAP and oxygen at bedtime and with naps  Diastolic dysfunction/chronic edema.  Continue on current regimen.  Chronic respiratory failure continue on oxygen.  O2 saturation goals greater than 88 to 90%.     Plan  Patient Instructions  Set up Medical transport,  needs extra large wheelchair , to get to medical appointments.  Continue on Prednisone 1044maily .  Low salt diet  Legs elevated  Remains on Oxygen 2l/m .  Continue on BIPAP At bedtime  with oxygen . Follow up with Dr. AlvElsworth Sohon 6-8 weeks and As needed   Please contact office for sooner follow up if symptoms do not improve or worsen or seek emergency care     Follow Up Instructions:    I discussed the assessment and treatment plan with the patient. The patient was provided an opportunity to ask questions and all were answered. The patient agreed with the plan and demonstrated an understanding of the instructions.   The patient was advised to call back or seek an in-person evaluation if the symptoms worsen or if the condition fails to improve as anticipated.  I provided 35  minutes of non-face-to-face time during this encounter.   TamRexene EdisonP

## 2021-06-09 NOTE — Telephone Encounter (Signed)
TP please advise.  PT had a video visit with you today. Thanks     I meant to bring it up in our visit. I had a flare up of skin rash on my face. Normally I would get them on my legs, however I got it on my face. I used the steroid ointment that i have a prescription for. It normally clear up any rash on my legs in a few days. . The rash  cleared it up but left marks. My question is should I use something different if I have a flare up and it affect my face? This happen about 5 months ago. [name]

## 2021-06-10 DIAGNOSIS — G4733 Obstructive sleep apnea (adult) (pediatric): Secondary | ICD-10-CM | POA: Diagnosis not present

## 2021-06-18 ENCOUNTER — Other Ambulatory Visit: Payer: Self-pay | Admitting: Adult Health

## 2021-06-30 DIAGNOSIS — J441 Chronic obstructive pulmonary disease with (acute) exacerbation: Secondary | ICD-10-CM | POA: Diagnosis not present

## 2021-06-30 DIAGNOSIS — R918 Other nonspecific abnormal finding of lung field: Secondary | ICD-10-CM | POA: Diagnosis not present

## 2021-06-30 DIAGNOSIS — M1712 Unilateral primary osteoarthritis, left knee: Secondary | ICD-10-CM | POA: Diagnosis not present

## 2021-06-30 DIAGNOSIS — D869 Sarcoidosis, unspecified: Secondary | ICD-10-CM | POA: Diagnosis not present

## 2021-06-30 DIAGNOSIS — I5030 Unspecified diastolic (congestive) heart failure: Secondary | ICD-10-CM | POA: Diagnosis not present

## 2021-07-11 DIAGNOSIS — G4733 Obstructive sleep apnea (adult) (pediatric): Secondary | ICD-10-CM | POA: Diagnosis not present

## 2021-07-19 ENCOUNTER — Other Ambulatory Visit: Payer: Self-pay | Admitting: Adult Health

## 2021-07-24 DIAGNOSIS — G4733 Obstructive sleep apnea (adult) (pediatric): Secondary | ICD-10-CM | POA: Diagnosis not present

## 2021-07-30 DIAGNOSIS — J441 Chronic obstructive pulmonary disease with (acute) exacerbation: Secondary | ICD-10-CM | POA: Diagnosis not present

## 2021-07-30 DIAGNOSIS — I5030 Unspecified diastolic (congestive) heart failure: Secondary | ICD-10-CM | POA: Diagnosis not present

## 2021-07-30 DIAGNOSIS — M1712 Unilateral primary osteoarthritis, left knee: Secondary | ICD-10-CM | POA: Diagnosis not present

## 2021-07-30 DIAGNOSIS — D869 Sarcoidosis, unspecified: Secondary | ICD-10-CM | POA: Diagnosis not present

## 2021-07-30 DIAGNOSIS — R918 Other nonspecific abnormal finding of lung field: Secondary | ICD-10-CM | POA: Diagnosis not present

## 2021-08-03 ENCOUNTER — Emergency Department (HOSPITAL_COMMUNITY)
Admission: EM | Admit: 2021-08-03 | Discharge: 2021-08-03 | Disposition: A | Discharge status: Home / Self Care | Payer: Medicare Other | Attending: Emergency Medicine | Admitting: Emergency Medicine

## 2021-08-03 ENCOUNTER — Other Ambulatory Visit: Payer: Self-pay

## 2021-08-03 ENCOUNTER — Emergency Department (HOSPITAL_COMMUNITY): Payer: Medicare Other

## 2021-08-03 DIAGNOSIS — I110 Hypertensive heart disease with heart failure: Secondary | ICD-10-CM | POA: Insufficient documentation

## 2021-08-03 DIAGNOSIS — R0602 Shortness of breath: Secondary | ICD-10-CM | POA: Diagnosis not present

## 2021-08-03 DIAGNOSIS — Z79899 Other long term (current) drug therapy: Secondary | ICD-10-CM | POA: Insufficient documentation

## 2021-08-03 DIAGNOSIS — J449 Chronic obstructive pulmonary disease, unspecified: Secondary | ICD-10-CM | POA: Insufficient documentation

## 2021-08-03 DIAGNOSIS — N132 Hydronephrosis with renal and ureteral calculous obstruction: Secondary | ICD-10-CM | POA: Diagnosis not present

## 2021-08-03 DIAGNOSIS — N201 Calculus of ureter: Secondary | ICD-10-CM | POA: Diagnosis not present

## 2021-08-03 DIAGNOSIS — I5033 Acute on chronic diastolic (congestive) heart failure: Secondary | ICD-10-CM | POA: Insufficient documentation

## 2021-08-03 DIAGNOSIS — J811 Chronic pulmonary edema: Secondary | ICD-10-CM | POA: Diagnosis not present

## 2021-08-03 DIAGNOSIS — N200 Calculus of kidney: Secondary | ICD-10-CM | POA: Diagnosis not present

## 2021-08-03 DIAGNOSIS — Z743 Need for continuous supervision: Secondary | ICD-10-CM | POA: Diagnosis not present

## 2021-08-03 DIAGNOSIS — R109 Unspecified abdominal pain: Secondary | ICD-10-CM | POA: Diagnosis present

## 2021-08-03 DIAGNOSIS — E039 Hypothyroidism, unspecified: Secondary | ICD-10-CM | POA: Diagnosis not present

## 2021-08-03 DIAGNOSIS — I10 Essential (primary) hypertension: Secondary | ICD-10-CM | POA: Diagnosis not present

## 2021-08-03 DIAGNOSIS — R1084 Generalized abdominal pain: Secondary | ICD-10-CM | POA: Diagnosis not present

## 2021-08-03 LAB — CBC WITH DIFFERENTIAL/PLATELET
Abs Immature Granulocytes: 0.04 10*3/uL (ref 0.00–0.07)
Basophils Absolute: 0 10*3/uL (ref 0.0–0.1)
Basophils Relative: 0 %
Eosinophils Absolute: 0.2 10*3/uL (ref 0.0–0.5)
Eosinophils Relative: 2 %
HCT: 39.8 % (ref 36.0–46.0)
Hemoglobin: 11 g/dL — ABNORMAL LOW (ref 12.0–15.0)
Immature Granulocytes: 1 %
Lymphocytes Relative: 9 %
Lymphs Abs: 0.8 10*3/uL (ref 0.7–4.0)
MCH: 21.1 pg — ABNORMAL LOW (ref 26.0–34.0)
MCHC: 27.6 g/dL — ABNORMAL LOW (ref 30.0–36.0)
MCV: 76.2 fL — ABNORMAL LOW (ref 80.0–100.0)
Monocytes Absolute: 0.8 10*3/uL (ref 0.1–1.0)
Monocytes Relative: 9 %
Neutro Abs: 7 10*3/uL (ref 1.7–7.7)
Neutrophils Relative %: 79 %
Platelets: 201 10*3/uL (ref 150–400)
RBC: 5.22 MIL/uL — ABNORMAL HIGH (ref 3.87–5.11)
RDW: 21.3 % — ABNORMAL HIGH (ref 11.5–15.5)
WBC: 8.8 10*3/uL (ref 4.0–10.5)
nRBC: 0 % (ref 0.0–0.2)

## 2021-08-03 LAB — URINALYSIS, ROUTINE W REFLEX MICROSCOPIC
Bacteria, UA: NONE SEEN
Bilirubin Urine: NEGATIVE
Glucose, UA: NEGATIVE mg/dL
Ketones, ur: NEGATIVE mg/dL
Nitrite: NEGATIVE
Protein, ur: 30 mg/dL — AB
RBC / HPF: >50 RBC/hpf — ABNORMAL HIGH (ref 0–5)
Specific Gravity, Urine: 1.014 (ref 1.005–1.030)
pH: 6 (ref 5.0–8.0)

## 2021-08-03 LAB — COMPREHENSIVE METABOLIC PANEL
ALT: 12 U/L (ref 0–44)
AST: 18 U/L (ref 15–41)
Albumin: 3.9 g/dL (ref 3.5–5.0)
Alkaline Phosphatase: 48 U/L (ref 38–126)
Anion gap: 11 (ref 5–15)
BUN: 21 mg/dL — ABNORMAL HIGH (ref 6–20)
CO2: 30 mmol/L (ref 22–32)
Calcium: 8.9 mg/dL (ref 8.9–10.3)
Chloride: 99 mmol/L (ref 98–111)
Creatinine, Ser: 0.99 mg/dL (ref 0.44–1.00)
GFR, Estimated: >60 mL/min (ref >60)
Glucose, Bld: 127 mg/dL — ABNORMAL HIGH (ref 70–99)
Potassium: 4.8 mmol/L (ref 3.5–5.1)
Sodium: 140 mmol/L (ref 135–145)
Total Bilirubin: 1.1 mg/dL (ref 0.3–1.2)
Total Protein: 7.8 g/dL (ref 6.5–8.1)

## 2021-08-03 LAB — BRAIN NATRIURETIC PEPTIDE: B Natriuretic Peptide: 19.2 pg/mL (ref 0.0–100.0)

## 2021-08-03 LAB — PREGNANCY, URINE: Preg Test, Ur: NEGATIVE

## 2021-08-03 MED ORDER — HYDROCODONE-ACETAMINOPHEN 5-325 MG PO TABS: 1.0000 | ORAL_TABLET | Freq: Three times a day (TID) | ORAL | 0 refills | Status: AC | PRN

## 2021-08-03 MED ORDER — TAMSULOSIN HCL 0.4 MG PO CAPS
0.4000 mg | ORAL_CAPSULE | Freq: Once | ORAL | Status: AC
  Administered 2021-08-03: 0.4 mg via ORAL
  Filled 2021-08-03: qty 1

## 2021-08-03 MED ORDER — ONDANSETRON 8 MG PO TBDP: 8.0000 mg | ORAL_TABLET | Freq: Three times a day (TID) | ORAL | 0 refills | Status: DC | PRN

## 2021-08-03 MED ORDER — NAPROXEN 500 MG PO TABS
500.0000 mg | ORAL_TABLET | Freq: Once | ORAL | Status: AC
  Administered 2021-08-03: 500 mg via ORAL
  Filled 2021-08-03: qty 1

## 2021-08-03 MED ORDER — HYDROCODONE-ACETAMINOPHEN 5-325 MG PO TABS
1.0000 | ORAL_TABLET | Freq: Once | ORAL | Status: AC
  Administered 2021-08-03: 1 via ORAL
  Filled 2021-08-03: qty 1

## 2021-08-03 MED ORDER — ACETAMINOPHEN 500 MG PO TABS
500.0000 mg | ORAL_TABLET | Freq: Four times a day (QID) | ORAL | 0 refills | Status: AC | PRN
Start: 2021-08-03 — End: ?

## 2021-08-03 NOTE — Discharge Instructions (Addendum)
We saw you in the ER for the abdominal pain. °Our results indicate that you have a kidney stone. °We were able to get your pain is relative control, and we can safely send you home. ° °Take the meds prescribed. °Set up an appointment with the Urologist. °If the pain is unbearable, you start having fevers, chills, and are unable to keep any meds down - then return to the ER. °  °

## 2021-08-03 NOTE — ED Notes (Signed)
Urine noted in suction container from purewick. 283ml amber urine. Specimen collected and sent to lab.

## 2021-08-03 NOTE — ED Provider Notes (Signed)
  Physical Exam  BP (!) 157/79   Pulse 79   Temp 98.9 F (37.2 C) (Oral)   Resp 18   Ht 5\' 5"  (1.651 m)   Wt (!) 229.1 kg   SpO2 97%   BMI 84.04 kg/m   Physical Exam  ED Course/Procedures     Procedures  MDM  Pt with cc of abd pain and urinary issues. She has copd, sarcoidosis.  Pt's pain is better. Doesn't want medicines. [place] - ? Hydronephrosis. Unable to fit in CT.  UA pending, will add KUB.  If urine is normal - presumptive dx is stone. If infected - consult urology.  9:24 AM  Concerns for stones. UA + for hematuria.  The patient appears reasonably screened and/or stabilized for discharge and I doubt any other medical condition or other [facility] requiring further screening, evaluation, or treatment in the ED at this time prior to discharge.   Results from the ER workup discussed with the patient face to face and all questions answered to the best of my ability. The patient is safe for discharge with strict return precautions.       Varney Biles, MD 08/03/21 [phone]

## 2021-08-03 NOTE — ED Triage Notes (Addendum)
Patient complains of left sided abdominal pain and urge to void, with shortness of breath. Primary care advised her to come to the ED for possibility of UTI. Patient also reports diarrhea. Patient wears 2L of oxygen at home at baseline.

## 2021-08-03 NOTE — ED Provider Notes (Signed)
[facility] DEPT Provider Note   CSN: [id number] Arrival date & time: 08/03/21  0203     History Chief Complaint  Patient presents with   Abdominal Pain    [name] is a 55 y.o. female.  The history is provided by the patient.  Abdominal Pain [name] is a 55 y.o. female who presents to the Emergency Department complaining of abdominal pain.  She presents to the ED complaining of LLQ abdominal pain/cramping that started three days ago. Two days ago she developed watery diarrhea and yesterday she developed urinary frequency. No significant dysuria. No associated fever. She also reports a history of chronic difficulty breathing and is on oxygen at baseline. She feels like her breathing is slightly worse than baseline. She increased her Lasix dose. She also switched from prednisone 10 to prednisone 20 weeks ago due to stopping her methotrexate.     Past Medical History:  Diagnosis Date   Anemia    Arthritis    hands, shoulders, no meds   CHF (congestive heart failure) (HCC)    EF60-65%   Chronic hyperventilation syndrome    w/ obesity tx with albuterol inhaler and oxygen 2L   COPD (chronic obstructive pulmonary disease) (HCC)    uses oxygen 2 L   Gallstones    GERD (gastroesophageal reflux disease)    diet controlled - no meds   H/O hiatal hernia    Hypertension    Hypothyroidism    Kidney stones    Morbid obesity ([place])    Pneumonia    hoispitalized in 08/2011   Sarcoidosis    Seasonal allergies    Sleep apnea    uses CPAP machine     Patient Active Problem List   Diagnosis Date Noted   Chronic respiratory failure with hypoxia ([place]) 08/16/2020   Sarcoidosis of lung ([place]) 08/16/2020   Acute exacerbation of CHF (congestive heart failure) ([place]) 66/04/3015   Diastolic CHF ([place]) 11/08/3233   Acute on chronic diastolic (congestive) heart failure ([place]) 05/08/2019   CHF exacerbation ([place]) 04/17/2018   Unilateral  primary osteoarthritis, left knee 02/20/2017   Unilateral primary osteoarthritis, right knee 02/20/2017   COPD exacerbation ([place]) 02/12/2017   GERD (gastroesophageal reflux disease) 02/12/2017   Abnormal uterine bleeding 08/09/2016   Microcytic anemia 02/27/2016   Hyperlipidemia 02/24/2016   Anxiety 04/14/2015   SOB (shortness of breath)    Hypoxemia    Symptomatic anemia 01/15/2015   Hypothyroidism 01/15/2015   Morbid obesity ([place]) 01/15/2015   Fibroids 12/09/2013   Hyperglycemia 09/16/2013   Hematuria 05/23/2013   Nephrolithiasis 05/23/2013   Sarcoidosis 09/09/2012   Iron deficiency anemia due to chronic blood loss 09/09/2012   Hypertension 02/25/2011   Obesity hypoventilation syndrome ([place]) 02/14/2011   OSA (obstructive sleep apnea) 02/10/2011    Past Surgical History:  Procedure Laterality Date   CESAREAN SECTION  1994   x 1   CHOLECYSTECTOMY  2000   DILATION AND CURETTAGE OF UTERUS N/A 08/09/2016   Procedure: DILATATION AND CURETTAGE;  Surgeon: Everitt Amber, MD;  Location: [facility];  Service: Gynecology;  Laterality: N/A;   HYSTEROSCOPY WITH D & C  12/27/2011   Procedure: DILATATION AND CURETTAGE /HYSTEROSCOPY;  Surgeon: Maeola Sarah. Landry Mellow, MD;  Location: [facility];  Service: Gynecology;;   I and D of abcess  05/2011   INTRAUTERINE DEVICE (IUD) INSERTION N/A 08/09/2016   Procedure: INTRAUTERINE DEVICE (IUD) INSERTION;  Surgeon: Everitt Amber, MD;  Location: [facility];  Service: Gynecology;  Laterality: N/A;   LUNG BIOPSY     uterine abletion       OB History   No obstetric history on file.     Family History  Problem Relation Age of Onset   Diabetes Father    Cancer Father 73       colon cancer    Diabetes Brother    Deep vein thrombosis Mother    Aneurysm Sister        d/o brain aneurysm    Social History   Tobacco Use   Smoking status: Never   Smokeless tobacco: Never  Vaping Use   Vaping Use: Never used  Substance Use Topics   Alcohol use: Yes    Comment:  occasionally   Drug use: No    Home Medications Prior to Admission medications   Medication Sig Start Date End Date Taking? Authorizing Provider  albuterol (VENTOLIN HFA) 108 (90 Base) MCG/ACT inhaler INHALE 1-2 PUFFS BY MOUTH EVERY 6 HOURS AS NEEDED FOR WHEEZE OR SHORTNESS OF BREATH 01/11/21   Nafziger, Tommi Rumps, NP  atorvastatin (LIPITOR) 40 MG tablet TAKE 1 TABLET (40 MG TOTAL) BY MOUTH DAILY AT 6 PM. 12/29/20   Nafziger, Tommi Rumps, NP  B-D TB SYRINGE 1CC/27GX1/2" 27G X 1/2" 1 ML MISC USE ONCE A WEEK TO INJECT METHOTREXATE 11/05/19   [provider]  calcium citrate-vitamin D (CITRACAL+D) 315-200 MG-UNIT tablet Take 1 tablet by mouth daily.    [provider]  cetirizine (ZYRTEC) 10 MG tablet Take 10 mg by mouth at bedtime as needed for allergies.    [provider]  famotidine (PEPCID) 40 MG tablet TAKE 1 TABLET BY MOUTH TWICE A DAY 08/21/20   Nafziger, Tommi Rumps, NP  folic acid (FOLVITE) 1 MG tablet Take 1 mg by mouth daily. 01/11/16   [provider]  HYDROcodone-acetaminophen (NORCO) 10-325 MG tablet Take 1 tablet by mouth every 6 (six) hours as needed (pain). 11/21/16   Pete Pelt, PA-C  losartan (COZAAR) 100 MG tablet TAKE 1 TABLET (100 MG TOTAL) DAILY BY MOUTH. 07/20/21   Nafziger, Tommi Rumps, NP  methotrexate 50 MG/2ML injection Inject 20 mg into the vein once a week. Dose is 0.8 mL (20mg ). Take on Fridays    [provider]  metoprolol succinate (TOPROL-XL) 25 MG 24 hr tablet TAKE 1 TABLET BY MOUTH EVERY DAY Patient taking differently: Take 25 mg by mouth daily. 05/29/20   Nafziger, Tommi Rumps, NP  NON FORMULARY 2 liter of oxygen    [provider]  predniSONE (DELTASONE) 10 MG tablet Take 1 tablet (10 mg total) by mouth daily. 01/17/18   Florencia Reasons, MD  torsemide (DEMADEX) 20 MG tablet Take 20 mg by mouth daily. Take two tablets ( 40 mg) for increase wt gain of 3 lbs in one day or 5 lbs in one week.    [provider]  triamcinolone ointment (KENALOG)  0.1 % Apply 1 application topically 2 (two) times daily. For Sarcoidosis    [provider]  vitamin B-12 1000 MCG tablet Take 1 tablet (1,000 mcg total) by mouth daily. 06/19/16   Lavina Hamman, MD    Allergies    Tape  Review of Systems   Review of Systems  Gastrointestinal:  Positive for abdominal pain.  All other systems reviewed and are negative.  Physical Exam Updated Vital Signs BP (!) 157/79   Pulse 79   Temp 98.9 F (37.2 C) (Oral)   Resp 18   Ht 5\' 5"  (1.651  m)   Wt (!) 229.1 kg   SpO2 97%   BMI 84.04 kg/m   Physical Exam Vitals and nursing note reviewed.  Constitutional:      Appearance: She is well-developed.  HENT:     Head: Normocephalic and atraumatic.  Cardiovascular:     Rate and Rhythm: Normal rate and regular rhythm.     Heart sounds: No murmur heard. Pulmonary:     Effort: Pulmonary effort is normal. No respiratory distress.     Breath sounds: Normal breath sounds.  Abdominal:     Palpations: Abdomen is soft.     Tenderness: There is no guarding or rebound.     Comments: Mild lower abdominal tenderness  Musculoskeletal:        General: No tenderness.     Comments: Nonpitting edema to BLE. Chronic venous stasis changes to BLE.  Skin:    General: Skin is warm and dry.  Neurological:     Mental Status: She is alert and oriented to person, place, and time.  Psychiatric:        Behavior: Behavior normal.    ED Results / Procedures / Treatments   Labs (all labs ordered are listed, but only abnormal results are displayed) Labs Reviewed  COMPREHENSIVE METABOLIC PANEL - Abnormal; Notable for the following components:      Result Value   Glucose, Bld 127 (*)    BUN 21 (*)    All other components within normal limits  CBC WITH DIFFERENTIAL/PLATELET - Abnormal; Notable for the following components:   RBC 5.22 (*)    Hemoglobin 11.0 (*)    MCV 76.2 (*)    MCH 21.1 (*)    MCHC 27.6 (*)    RDW 21.3 (*)    All other components within  normal limits  URINE CULTURE  BRAIN NATRIURETIC PEPTIDE  URINALYSIS, ROUTINE W REFLEX MICROSCOPIC  PREGNANCY, URINE    EKG EKG Interpretation  Date/Time:  Tuesday August 03 2021 03:22:38 EDT Ventricular Rate:  90 PR Interval:  194 QRS Duration: 92 QT Interval:  373 QTC Calculation: 457 R Axis:   82 Text Interpretation: Sinus rhythm Probable anterolateral infarct, old Baseline wander Confirmed by Quintella Reichert [phone]) on 08/03/2021 6:48:16 AM  Radiology US Renal  Result Date: 08/03/2021 CLINICAL DATA:  Initial evaluation for left lower quadrant pain for 3 days. EXAM: RENAL / URINARY TRACT ULTRASOUND COMPLETE COMPARISON:  None available. FINDINGS: Right Kidney: Renal measurements: 13.0 x 5.9 x 6.1 cm = volume: 245 mL. Renal echogenicity within normal limits. No nephrolithiasis or hydronephrosis. No focal renal mass. Left Kidney: Renal measurements: 14.1 x 7.8 x 8.0 cm = volume: 456.6 mL. Renal echogenicity within normal limits. 6 mm nonobstructive calculus present at the upper pole. Mild hydronephrosis is seen. No focal renal mass. Bladder: Appears normal for degree of bladder distention. Other: None. IMPRESSION: 1. Mild left-sided hydronephrosis. Finding raises the possibility for a distally obstructive stone within the left ureter. 2. Additional 6 mm nonobstructive left renal calculus. 3. Normal sonographic appearance of the right kidney. Electronically Signed   By: Jeannine Boga M.D.   On: 08/03/2021 04:32   DG Chest Port 1 View  Result Date: 08/03/2021 CLINICAL DATA:  Shortness of breath. EXAM: PORTABLE CHEST 1 VIEW COMPARISON:  None. FINDINGS: Mild atelectasis and/or early infiltrate is seen within the medial aspect of the right lung base. Overlying breast soft tissue artifact is seen. There is no evidence of a pleural effusion or pneumothorax. Mild, stable prominence of the  perihilar pulmonary vasculature is seen. The cardiac silhouette is mildly enlarged and unchanged in size.  The visualized skeletal structures are unremarkable. IMPRESSION: 1. Mild, stable pulmonary vascular congestion. 2. Mild right basilar atelectasis and/or early infiltrate. Electronically Signed   By: Virgina Norfolk M.D.   On: 08/03/2021 03:23    Procedures Procedures   Medications Ordered in ED Medications - No data to display  ED Course  I have reviewed the triage vital signs and the nursing notes.  Pertinent labs & imaging results that were available during my care of the patient were reviewed by me and considered in my medical decision making (see chart for details).    MDM Rules/Calculators/A&P                          patient with history of CHF, COPD and sarcoidosis here for evaluation of left lower quadrant abdominal pain, urinary frequency. She also complains of progressive shortness of breath. On evaluation she is non-toxic appearing, no respiratory distress unable to obtain CT scan due to equipment limitations. Ultrasound was obtained, which is concerning for possible left-sided hydronephrosis. CBC with stable anemia. BMP essentially within normal limits. Concern for kidney stone contributing to her left lower quadrant abdominal pain. Patient care transferred pending at urinalysis. She declined pain medications at this time in the department. In terms of her shortness of breath, she is breathing comfortably on her home oxygen settings. Current clinical picture is not consistent with pneumonia, decompensated CHF, PE.  Final Clinical Impression(s) / ED Diagnoses Final diagnoses:  None    Rx / DC Orders ED Discharge Orders     None        Quintella Reichert, MD 08/03/21 [phone]

## 2021-08-04 LAB — URINE CULTURE

## 2021-08-05 ENCOUNTER — Telehealth: Payer: Self-pay | Admitting: Adult Health

## 2021-08-05 NOTE — Telephone Encounter (Signed)
Patient was discharged from hospital two days ago and given prescriptions for pain, nausea and inflammation. Patient got prescriptions from pharmacy and is unsure they are the correct medications because there is nothing that is specified to treat inflammation of kidneys. Patient called back and was told to follow up with pcp, patient does have appointment with urologist on 10/7    Good callback number is [phone]

## 2021-08-06 ENCOUNTER — Telehealth: Payer: Self-pay | Admitting: Adult Health

## 2021-08-06 NOTE — Telephone Encounter (Signed)
Pt call and stated they got everything worked out with [facility].

## 2021-08-06 NOTE — Telephone Encounter (Signed)
Pt wants to know if any of the medication that was given to her is suppose to help with her nausea and inflammation bc it only look like pain medication. Pt would not go into details about which medication was prescribed bc she stated it is in the chart. Pt stated that she went to the urologist this morning and stated that she could not be seen in the office bc they did not have a wheelchair. PT stated that she is not sure what she needs to do from here since they were unable to accommodate her for there visit.

## 2021-08-09 ENCOUNTER — Other Ambulatory Visit: Payer: Self-pay | Admitting: Adult Health

## 2021-08-10 DIAGNOSIS — G4733 Obstructive sleep apnea (adult) (pediatric): Secondary | ICD-10-CM | POA: Diagnosis not present

## 2021-08-10 NOTE — Telephone Encounter (Signed)
This has been taking care of already. Pt called to let me know that she was able to be seen at urologist. They made arrangements for her.

## 2021-08-11 ENCOUNTER — Telehealth: Payer: Self-pay

## 2021-08-11 ENCOUNTER — Encounter: Payer: Self-pay | Admitting: Adult Health

## 2021-08-11 NOTE — Telephone Encounter (Signed)
Pt states she noticed bump on right labia yesterday that has increased in size; now bigger than a pea, but smaller than a grape. Only noted to be sore when touched during wiping.  Offered a sitz bath to be done in the toilet. Pt denies having GYN.  Pt states she would like to know how the bump will be assessed since she is unable to get on the table.  Pt also made aware that appt needed for annual exam to get refills of medications if needed. Pt states no meds needed, but is now aware that if any medications are needed in the future it will have to be a in person visit since last OV was in 2020. Pt verb understanding. Pt was in office for AWV in Feb 2022; explained how that visit is different from a visit with PCP; pt verb understanding.  Pt states she will f/u after sitz bath (to be done in toilet base 3x day) to determine if improvement or in person f/u needed.  Pt satisfied with phone call.

## 2021-08-11 NOTE — Telephone Encounter (Signed)
Called pt after receiving a refill request from pharmacy. Pt has not not been seen since 2021. Advised pt that an in person appt. Was needed but medication would be sent in for 30 days. Pt stated that we did not have a wheelchair that could accommodate her. I advised pt that we have different size wheelchairs and we have one that goes up to 500lbs. Pt declined visit still and stated that her hips were too big to fit in the wheelchair. I advised that per [facility] Rx can be filled for 30 days but pt will have to come into office to be seen or she may have to find another PCP. Pt became stated that he wanted to speak with [facility]. I advised pt that Tommi Rumps was unavailable. Pt began to come upset and stated that I said " Tommi Rumps does not want to speak to her" I advised pt that I just stated he was unavailable but that was Cory's final word that she would need to be seen in office. Pt still wanted a call from [facility] and I advised pt to call and make an in office appt. When she is available before her next refill ran out in 30 days. Pt verbalized understanding.  No further actions needed.

## 2021-08-23 DIAGNOSIS — M159 Polyosteoarthritis, unspecified: Secondary | ICD-10-CM | POA: Diagnosis not present

## 2021-08-23 DIAGNOSIS — G894 Chronic pain syndrome: Secondary | ICD-10-CM | POA: Diagnosis not present

## 2021-08-23 DIAGNOSIS — M064 Inflammatory polyarthropathy: Secondary | ICD-10-CM | POA: Diagnosis not present

## 2021-08-23 DIAGNOSIS — M25562 Pain in left knee: Secondary | ICD-10-CM | POA: Diagnosis not present

## 2021-08-23 DIAGNOSIS — Z79899 Other long term (current) drug therapy: Secondary | ICD-10-CM | POA: Diagnosis not present

## 2021-08-23 DIAGNOSIS — M2550 Pain in unspecified joint: Secondary | ICD-10-CM | POA: Diagnosis not present

## 2021-08-23 DIAGNOSIS — D860 Sarcoidosis of lung: Secondary | ICD-10-CM | POA: Diagnosis not present

## 2021-08-30 DIAGNOSIS — I5030 Unspecified diastolic (congestive) heart failure: Secondary | ICD-10-CM | POA: Diagnosis not present

## 2021-08-30 DIAGNOSIS — M1712 Unilateral primary osteoarthritis, left knee: Secondary | ICD-10-CM | POA: Diagnosis not present

## 2021-08-30 DIAGNOSIS — J441 Chronic obstructive pulmonary disease with (acute) exacerbation: Secondary | ICD-10-CM | POA: Diagnosis not present

## 2021-08-30 DIAGNOSIS — R918 Other nonspecific abnormal finding of lung field: Secondary | ICD-10-CM | POA: Diagnosis not present

## 2021-08-30 DIAGNOSIS — D869 Sarcoidosis, unspecified: Secondary | ICD-10-CM | POA: Diagnosis not present

## 2021-09-10 DIAGNOSIS — G4733 Obstructive sleep apnea (adult) (pediatric): Secondary | ICD-10-CM | POA: Diagnosis not present

## 2021-09-12 ENCOUNTER — Other Ambulatory Visit: Payer: Self-pay | Admitting: Adult Health

## 2021-09-22 ENCOUNTER — Telehealth: Payer: Self-pay

## 2021-09-22 NOTE — Telephone Encounter (Signed)
Received fax from [place] requesting updated & signed CMN & F2F with discussion of [phone] as the patient currently has a rental power w/c; this info is being requested for continued coverage through the patient's insurance.  Pt notified of above. Confirms that she currently has w/c. Advised that appt will be needed to continued insurance coverage per [place].  Appt made for CPE & w/c eval for 09/29/21.  Pt also states she has had cold symptoms x 4 days: cough, head congestion, low grade fever. States Covid test "a few days ago" was negative, but symptoms appear to be worse. Pt endorses her normal SOB that is a bit increased due to congestion however, she is not working to breathe any more than normal. Has been taking Theraflu & cough drops. Pt advised to take Tylenol & Mucinex (do NOT use decongestant). Declines VV for symptoms at this time & is advised to seek emergent/urgent care if SOB worsens over Thanksgiving & to call & schedule appt if symptoms do not improve. Pt verb understanding.

## 2021-09-29 ENCOUNTER — Encounter: Payer: Medicare Other | Admitting: Adult Health

## 2021-09-29 DIAGNOSIS — M1712 Unilateral primary osteoarthritis, left knee: Secondary | ICD-10-CM | POA: Diagnosis not present

## 2021-09-29 DIAGNOSIS — I5030 Unspecified diastolic (congestive) heart failure: Secondary | ICD-10-CM | POA: Diagnosis not present

## 2021-09-29 DIAGNOSIS — R918 Other nonspecific abnormal finding of lung field: Secondary | ICD-10-CM | POA: Diagnosis not present

## 2021-09-29 DIAGNOSIS — D869 Sarcoidosis, unspecified: Secondary | ICD-10-CM | POA: Diagnosis not present

## 2021-09-29 DIAGNOSIS — J441 Chronic obstructive pulmonary disease with (acute) exacerbation: Secondary | ICD-10-CM | POA: Diagnosis not present

## 2021-09-29 NOTE — Progress Notes (Deleted)
Subjective:    Patient ID: [name], female    DOB: [birth year], 55 y.o.   MRN: [id number]  HPI Patient presents for yearly preventative medicine examination. She is a a 55 year old female who  has a past medical history of Anemia, Arthritis, CHF (congestive heart failure) ([place]), Chronic hyperventilation syndrome, COPD (chronic obstructive pulmonary disease) ([place]), Gallstones, GERD (gastroesophageal reflux disease), H/O hiatal hernia, Hypertension, Hypothyroidism, Kidney stones, Morbid obesity ([place]), Pneumonia, Sarcoidosis, Seasonal allergies, and Sleep apnea.  Her last CPE was in 05/2019 - she has been reluctant to come into the office   Essential Hypertension -controlled with Cozaar 100 mg daily and metoprolol XR 25 mg daily. BP Readings from Last 3 Encounters:  08/03/21 (!) 153/78  11/24/20 (!) 154/86  09/07/20 139/85   Sarcoidosis/OSA-is managed by pulmonary.  Currently prescribed methotrexate.  She continues to have shortness of breath especially with exertion.  Finds it difficult to walk greater than a few feet without having to stop, sit down, and catch her breath.  She does continue to wear home oxygen  Chronic diastolic heart failure-is managed by cardiology.  Currently prescribed Torsemide 20 mg daily   Iron deficiency anemia-is managed by oncology with iron infusions.    Vitamin D deficiency-takes vitamin D supplements daily  Vitamin B12 deficiency-takes oral vitamin B 12 daily  Morbid obesity secondary to sedentary lifestyle.  This seems to be the cause of many of her chronic health issues.  Hyperlipidemia-currently prescribed lipitor 40 mg daily. Denies myalgia or fatigue  Lab Results  Component Value Date   CHOL 241 (H) 05/08/2019   HDL 58.80 05/08/2019   LDLCALC 157 (H) 05/08/2019   TRIG 125.0 05/08/2019   CHOLHDL 4 05/08/2019   Glucose Intolerance - not currently on medications  Lab Results  Component Value Date   HGBA1C 5.7 05/08/2019    All  immunizations and health maintenance protocols were reviewed with the patient and needed orders were placed.  Appropriate screening laboratory values were ordered for the patient including screening of hyperlipidemia, renal function and hepatic function.   Medication reconciliation,  past medical history, social history, problem list and allergies were reviewed in detail with the patient  Goals were established with regard to weight loss, exercise, and  diet in compliance with medications  Review of Systems  Constitutional: Negative.   HENT: Negative.    Eyes: Negative.   Respiratory: Negative.    Cardiovascular: Negative.   Gastrointestinal: Negative.   Endocrine: Negative.   Genitourinary: Negative.   Musculoskeletal: Negative.   Skin: Negative.   Allergic/Immunologic: Negative.   Neurological: Negative.   Hematological: Negative.   Psychiatric/Behavioral: Negative.     Past Medical History:  Diagnosis Date   Anemia    Arthritis    hands, shoulders, no meds   CHF (congestive heart failure) (H[facility])    EF60-65%   Chronic hyperventilation syndrome    w/ obesity tx with albuterol inhaler and oxygen 2L   COPD (chronic obstructive pulmonary disease) ([facility])    uses oxygen 2 L   Gallstones    GERD (gastroesophageal reflux disease)    diet controlled - no meds   H/O hiatal hernia    Hypertension    Hypothyroidism    Kidney stones    Morbid obesity ([place])    Pneumonia    hoispitalized in 08/2011   Sarcoidosis    Seasonal allergies    Sleep apnea    uses CPAP machine     Social History  Socioeconomic History   Marital status: Single    Spouse name: Not on file   Number of children: 1   Years of education: 72   Highest education level: Bachelor's degree (e.g., BA, AB, BS)  Occupational History   Occupation: disability   Tobacco Use   Smoking status: Never   Smokeless tobacco: Never  Vaping Use   Vaping Use: Never used  Substance and Sexual Activity   Alcohol  use: Yes    Comment: occasionally   Drug use: No   Sexual activity: Never    Birth control/protection: None  Other Topics Concern   Not on file  Social History Narrative   Son lives with patient.   Divorced for eight or nine years   disabled   Social Determinants of Radio broadcast assistant Strain: Low Risk    Difficulty of Paying Living Expenses: Not hard at all  Food Insecurity: No Food Insecurity   Worried About Charity fundraiser in the Last Year: Never true   Arboriculturist in the Last Year: Never true  Transportation Needs: No Transportation Needs   Lack of Transportation (Medical): No   Lack of Transportation (Non-Medical): No  Physical Activity: Inactive   Days of Exercise per Week: 0 days   Minutes of Exercise per Session: 0 min  Stress: No Stress Concern Present   Feeling of Stress : Not at all  Social Connections: Socially Isolated   Frequency of Communication with Friends and Family: More than three times a week   Frequency of Social Gatherings with Friends and Family: More than three times a week   Attends Religious Services: Never   Marine scientist or Organizations: No   Attends Archivist Meetings: Never   Marital Status: Divorced  Human resources officer Violence: Not At Risk   Fear of Current or Ex-Partner: No   Emotionally Abused: No   Physically Abused: No   Sexually Abused: No    Past Surgical History:  Procedure Laterality Date   CESAREAN SECTION  1994   x 1   CHOLECYSTECTOMY  2000   DILATION AND CURETTAGE OF UTERUS N/A 08/09/2016   Procedure: DILATATION AND CURETTAGE;  Surgeon: Everitt Amber, MD;  Location: [facility];  Service: Gynecology;  Laterality: N/A;   HYSTEROSCOPY WITH D & C  12/27/2011   Procedure: DILATATION AND CURETTAGE /HYSTEROSCOPY;  Surgeon: Maeola Sarah. Landry Mellow, MD;  Location: [facility];  Service: Gynecology;;   I and D of abcess  05/2011   INTRAUTERINE DEVICE (IUD) INSERTION N/A 08/09/2016   Procedure: INTRAUTERINE DEVICE (IUD)  INSERTION;  Surgeon: Everitt Amber, MD;  Location: [facility];  Service: Gynecology;  Laterality: N/A;   LUNG BIOPSY     uterine abletion      Family History  Problem Relation Age of Onset   Diabetes Father    Cancer Father 81       colon cancer    Diabetes Brother    Deep vein thrombosis Mother    Aneurysm Sister        d/o brain aneurysm    Allergies  Allergen Reactions   Tape Itching    Leads cause Skin burns and irritation after 24 hours+    Current Outpatient Medications on File Prior to Visit  Medication Sig Dispense Refill   acetaminophen (TYLENOL) 500 MG tablet Take 1 tablet (500 mg total) by mouth every 6 (six) hours as needed. 30 tablet 0   albuterol (VENTOLIN HFA) 108 (90  Base) MCG/ACT inhaler INHALE 1-2 PUFFS BY MOUTH EVERY 6 HOURS AS NEEDED FOR WHEEZE OR SHORTNESS OF BREATH 8.5 each 3   atorvastatin (LIPITOR) 40 MG tablet TAKE 1 TABLET (40 MG TOTAL) BY MOUTH DAILY AT 6 PM. 90 tablet 0   B-D TB SYRINGE 1CC/27GX1/2" 27G X 1/2" 1 ML MISC USE ONCE A WEEK TO INJECT METHOTREXATE     calcium citrate-vitamin D (CITRACAL+D) 315-200 MG-UNIT tablet Take 1 tablet by mouth daily.     cetirizine (ZYRTEC) 10 MG tablet Take 10 mg by mouth at bedtime as needed for allergies.     famotidine (PEPCID) 40 MG tablet TAKE 1 TABLET BY MOUTH TWICE A DAY 60 tablet 0   folic acid (FOLVITE) 1 MG tablet Take 1 mg by mouth daily.  3   losartan (COZAAR) 100 MG tablet TAKE 1 TABLET (100 MG TOTAL) DAILY BY MOUTH. 90 tablet 3   methotrexate 50 MG/2ML injection Inject 20 mg into the vein once a week. Dose is 0.8 mL (20mg ). Take on Fridays     metoprolol succinate (TOPROL-XL) 25 MG 24 hr tablet TAKE 1 TABLET BY MOUTH EVERY DAY 30 tablet 0   NON FORMULARY 2 liter of oxygen     ondansetron (ZOFRAN ODT) 8 MG disintegrating tablet Take 1 tablet (8 mg total) by mouth every 8 (eight) hours as needed for nausea. 20 tablet 0   predniSONE (DELTASONE) 10 MG tablet Take 1 tablet (10 mg total) by mouth daily. 10 tablet 0    torsemide (DEMADEX) 20 MG tablet Take 20 mg by mouth daily. Take two tablets ( 40 mg) for increase wt gain of 3 lbs in one day or 5 lbs in one week.     triamcinolone ointment (KENALOG) 0.1 % Apply 1 application topically 2 (two) times daily. For Sarcoidosis     vitamin B-12 1000 MCG tablet Take 1 tablet (1,000 mcg total) by mouth daily. 30 tablet 0   No current facility-administered medications on file prior to visit.    There were no vitals taken for this visit.       Objective:   Physical Exam Vitals and nursing note reviewed.  Constitutional:      General: She is not in acute distress.    Appearance: Normal appearance. She is well-developed. She is not ill-appearing.  HENT:     Head: Normocephalic and atraumatic.     Right Ear: Tympanic membrane, ear canal and external ear normal. There is no impacted cerumen.     Left Ear: Tympanic membrane, ear canal and external ear normal. There is no impacted cerumen.     Nose: Nose normal. No congestion or rhinorrhea.     Mouth/Throat:     Mouth: Mucous membranes are moist.     Pharynx: Oropharynx is clear. No oropharyngeal exudate or posterior oropharyngeal erythema.  Eyes:     General:        Right eye: No discharge.        Left eye: No discharge.     Extraocular Movements: Extraocular movements intact.     Conjunctiva/sclera: Conjunctivae normal.     Pupils: Pupils are equal, round, and reactive to light.  Neck:     Thyroid: No thyromegaly.     Vascular: No carotid bruit.     Trachea: No tracheal deviation.  Cardiovascular:     Rate and Rhythm: Normal rate and regular rhythm.     Pulses: Normal pulses.     Heart sounds: Normal heart sounds. No  murmur heard.   No friction rub. No gallop.  Pulmonary:     Effort: Pulmonary effort is normal. No respiratory distress.     Breath sounds: Normal breath sounds. No stridor. No wheezing, rhonchi or rales.  Chest:     Chest wall: No tenderness.  Abdominal:     General: Abdomen is  flat. Bowel sounds are normal. There is no distension.     Palpations: Abdomen is soft. There is no mass.     Tenderness: There is no abdominal tenderness. There is no right CVA tenderness, left CVA tenderness, guarding or rebound.     Hernia: No hernia is present.  Musculoskeletal:        General: No swelling, tenderness, deformity or signs of injury. Normal range of motion.     Cervical back: Normal range of motion and neck supple.     Right lower leg: No edema.     Left lower leg: No edema.  Lymphadenopathy:     Cervical: No cervical adenopathy.  Skin:    General: Skin is warm and dry.     Coloration: Skin is not jaundiced or pale.     Findings: No bruising, erythema, lesion or rash.  Neurological:     General: No focal deficit present.     Mental Status: She is alert and oriented to person, place, and time.     Cranial Nerves: No cranial nerve deficit.     Sensory: No sensory deficit.     Motor: No weakness.     Coordination: Coordination normal.     Gait: Gait normal.     Deep Tendon Reflexes: Reflexes normal.  Psychiatric:        Mood and Affect: Mood normal.        Behavior: Behavior normal.        Thought Content: Thought content normal.        Judgment: Judgment normal.      Assessment & Plan:

## 2021-09-30 ENCOUNTER — Telehealth (INDEPENDENT_AMBULATORY_CARE_PROVIDER_SITE_OTHER): Payer: Medicare Other | Admitting: Family Medicine

## 2021-09-30 ENCOUNTER — Encounter: Payer: Self-pay | Admitting: Family Medicine

## 2021-09-30 VITALS — BP 148/60 | HR 81 | Temp 98.2°F

## 2021-09-30 DIAGNOSIS — R0600 Dyspnea, unspecified: Secondary | ICD-10-CM

## 2021-09-30 NOTE — Progress Notes (Signed)
Virtual Visit via Video Note  I connected with Verchelle  on 09/30/21 at 10:40 AM EST by a video enabled telemedicine application and verified that I am speaking with the correct person using two identifiers.  Location patient: home, [place] Location provider:work or home office Persons participating in the virtual visit: patient, provider  I discussed the limitations of evaluation and management by telemedicine and the availability of in person appointments. The patient expressed understanding and agreed to proceed.   HPI:  Acute telemedicine visit for sinus issues and cough: -Onset: 1.5 weeks ago - thinks she had the flu -Symptoms include:started with sore throat, runny nose and cough, SOB, covid test was negative, reports is doing much better now but a little SOB at times - her inhaler helps but has not used the last few days -on 2 L of O2 and O2 is 94% which is not too low for her she reports  -she monitors her weight and does not feel that it is up, but she did hold her lasix for 4-5 days when she was sick last week -Denies: CP, fevers, NVD, malaise, thick mucus, hemoptysis -Pertinent past medical history: see below - sees cardiologist and pulmonologist, reports pulmonologist does steroid burst sometimes when she is sick and she has plenty of steroid pills -Pertinent medication allergies: Allergies  Allergen Reactions   Tape Itching    Leads cause Skin burns and irritation after 24 hours+  -COVID-19 vaccine status: Immunization History  Administered Date(s) Administered   Influenza Split 08/22/2011   Influenza Whole 07/31/2012   Influenza,inj,Quad PF,6+ Mos 09/16/2013, 07/11/2014, 07/11/2015, 09/04/2017, 11/07/2018, 08/19/2019, 08/13/2020   Influenza-Unspecified 08/18/2016   PFIZER(Purple Top)SARS-COV-2 Vaccination 01/16/2020, 02/06/2020, 11/03/2020   Pneumococcal Polysaccharide-23 08/19/2019   Pneumococcal-Unspecified 08/01/2011     ROS: See pertinent positives and negatives  per HPI.  Past Medical History:  Diagnosis Date   Anemia    Arthritis    hands, shoulders, no meds   CHF (congestive heart failure) ([facility])    EF60-65%   Chronic hyperventilation syndrome    w/ obesity tx with albuterol inhaler and oxygen 2L   COPD (chronic obstructive pulmonary disease) ([facility])    uses oxygen 2 L   Gallstones    GERD (gastroesophageal reflux disease)    diet controlled - no meds   H/O hiatal hernia    Hypertension    Hypothyroidism    Kidney stones    Morbid obesity ([place])    Pneumonia    hoispitalized in 08/2011   Sarcoidosis    Seasonal allergies    Sleep apnea    uses CPAP machine     Past Surgical History:  Procedure Laterality Date   CESAREAN SECTION  1994   x 1   CHOLECYSTECTOMY  2000   DILATION AND CURETTAGE OF UTERUS N/A 08/09/2016   Procedure: DILATATION AND CURETTAGE;  Surgeon: Everitt Amber, MD;  Location: [facility];  Service: Gynecology;  Laterality: N/A;   HYSTEROSCOPY WITH D & C  12/27/2011   Procedure: DILATATION AND CURETTAGE /HYSTEROSCOPY;  Surgeon: Maeola Sarah. Landry Mellow, MD;  Location: [facility];  Service: Gynecology;;   I and D of abcess  05/2011   INTRAUTERINE DEVICE (IUD) INSERTION N/A 08/09/2016   Procedure: INTRAUTERINE DEVICE (IUD) INSERTION;  Surgeon: Everitt Amber, MD;  Location: [facility];  Service: Gynecology;  Laterality: N/A;   LUNG BIOPSY     uterine abletion       Current Outpatient Medications:    acetaminophen (TYLENOL) 500 MG tablet, Take 1 tablet (  500 mg total) by mouth every 6 (six) hours as needed., Disp: 30 tablet, Rfl: 0   albuterol (VENTOLIN HFA) 108 (90 Base) MCG/ACT inhaler, INHALE 1-2 PUFFS BY MOUTH EVERY 6 HOURS AS NEEDED FOR WHEEZE OR SHORTNESS OF BREATH, Disp: 8.5 each, Rfl: 3   atorvastatin (LIPITOR) 40 MG tablet, TAKE 1 TABLET (40 MG TOTAL) BY MOUTH DAILY AT 6 PM., Disp: 90 tablet, Rfl: 0   B-D TB SYRINGE 1CC/27GX1/2" 27G X 1/2" 1 ML MISC, USE ONCE A WEEK TO INJECT METHOTREXATE, Disp: , Rfl:    calcium citrate-vitamin D  (CITRACAL+D) 315-200 MG-UNIT tablet, Take 1 tablet by mouth daily., Disp: , Rfl:    cetirizine (ZYRTEC) 10 MG tablet, Take 10 mg by mouth at bedtime as needed for allergies., Disp: , Rfl:    famotidine (PEPCID) 40 MG tablet, TAKE 1 TABLET BY MOUTH TWICE A DAY, Disp: 60 tablet, Rfl: 0   folic acid (FOLVITE) 1 MG tablet, Take 1 mg by mouth daily., Disp: , Rfl: 3   HYDROcodone-acetaminophen (NORCO) 10-325 MG tablet, 1 tablet as needed, Disp: , Rfl:    losartan (COZAAR) 100 MG tablet, TAKE 1 TABLET (100 MG TOTAL) DAILY BY MOUTH., Disp: 90 tablet, Rfl: 3   methotrexate 50 MG/2ML injection, Inject 20 mg into the vein once a week. Dose is 0.8 mL (20mg ). Take on Fridays, Disp: , Rfl:    metoprolol succinate (TOPROL-XL) 25 MG 24 hr tablet, TAKE 1 TABLET BY MOUTH EVERY DAY, Disp: 30 tablet, Rfl: 0   NON FORMULARY, 2 liter of oxygen, Disp: , Rfl:    ondansetron (ZOFRAN ODT) 8 MG disintegrating tablet, Take 1 tablet (8 mg total) by mouth every 8 (eight) hours as needed for nausea., Disp: 20 tablet, Rfl: 0   predniSONE (DELTASONE) 10 MG tablet, Take 1 tablet (10 mg total) by mouth daily., Disp: 10 tablet, Rfl: 0   torsemide (DEMADEX) 20 MG tablet, Take 20 mg by mouth daily. Take two tablets ( 40 mg) for increase wt gain of 3 lbs in one day or 5 lbs in one week., Disp: , Rfl:    triamcinolone ointment (KENALOG) 0.1 %, Apply 1 application topically 2 (two) times daily. For Sarcoidosis, Disp: , Rfl:    vitamin B-12 1000 MCG tablet, Take 1 tablet (1,000 mcg total) by mouth daily., Disp: 30 tablet, Rfl: 0  EXAM:  VITALS per patient if applicable:  GENERAL: alert, oriented, appears well and in no acute distress  HEENT: atraumatic, conjunttiva clear, no obvious abnormalities on inspection of external nose and ears  NECK: normal movements of the head and neck  LUNGS: on inspection no signs of respiratory distress, breathing rate appears normal, no obvious gross SOB, gasping or wheezing  CV: no obvious  cyanosis  MS: moves all visible extremities without noticeable abnormality  PSYCH/NEURO: pleasant and cooperative, no obvious depression or anxiety, speech and thought processing grossly intact  ASSESSMENT AND PLAN:  Discussed the following assessment and plan:  Dyspnea, unspecified type  -we discussed possible serious and likely etiologies, options for evaluation and workup, limitations of telemedicine visit vs in person visit, treatment, treatment risks and precautions. Pt is agreeable to treatment via telemedicine at this moment. Reports mild improving dyspnea following recent resp illness. Denies persistent cough or sinus issues, fevers, malaise, thick mucus, O2 below 94 or appreciable weight gain/edema. Advised she check her weight and if up contact her cardiologist. She has restarted her lasix, but did not take for several days due to being sick.  If weight normal trial albuterol q6 hours prn and steroid burst if needed. Advised if these measures not working, and/or worsening or new symptoms or O2 <94 to contact her pulmonologist or PCP for follow up.   Advised to seek prompt in person care if worsening, new symptoms arise, or if is not improving with treatment. Discussed options for inperson care if PCP office not available. Did let this patient know that I only do telemedicine on Tuesdays and Thursdays for [place]. Advised to schedule follow up visit with PCP or [facility] if any further questions or concerns to avoid delays in care.   I discussed the assessment and treatment plan with the patient. The patient was provided an opportunity to ask questions and all were answered. The patient agreed with the plan and demonstrated an understanding of the instructions.     Lucretia Kern, DO

## 2021-09-30 NOTE — Patient Instructions (Signed)
Please check you weight and contact your cardiologist if elevated.  Please continue your lasix.  Try alb every 6 hours if needed if any further difficulty breathing.  Could also try (if weight normal) a prednisone burst of 40mg  of prednisone daily for 4 days.  I hope you are feeling better soon!  Seek in person care promptly if your symptoms worsen, new concerns arise or you are not improving with treatment.  It was nice to meet you today. I help [facility] out with telemedicine visits on Tuesdays and Thursdays and am available for visits on those days. If you have any concerns or questions following this visit please schedule a follow up visit with your Primary Care doctor or seek care at a local urgent care clinic to avoid delays in care.

## 2021-10-07 ENCOUNTER — Encounter: Payer: Medicare Other | Admitting: Adult Health

## 2021-10-07 NOTE — Progress Notes (Deleted)
   Subjective:    Patient ID: [name], female    DOB: May 24, 1966, 55 y.o.   MRN: [id number]  HPI Patient presents for yearly preventative medicine examination and evaluation for continued use of power mobility device. She is a 55 year old female who  has a past medical history of Anemia, Arthritis, CHF (congestive heart failure) ([place]), Chronic hyperventilation syndrome, COPD (chronic obstructive pulmonary disease) ([place]), Gallstones, GERD (gastroesophageal reflux disease), H/O hiatal hernia, Hypertension, Hypothyroidism, Kidney stones, Morbid obesity ([place]), Pneumonia, Sarcoidosis, Seasonal allergies, and Sleep apnea.  Essential Hypertension -managed with Cozaar 100 mg daily and metoprolol XR 25 mg daily.  She denies dizziness, lightheadedness or chest pain. BP Readings from Last 3 Encounters:  09/30/21 (!) 148/60  08/03/21 (!) 153/78  11/24/20 (!) 154/86   Sarcoidosis/OSA/Chronic Respiratory Failure -managed by pulmonary and Rheumatology with methotrexate 20 mg IV weekly.  She does not wear home oxygen and BiPap every night.  She has chronic shortness of breath with minimalactivity tolerance.  Reported easily with minimal activity.  Chronic diastolic heart failure-managed by cardiology.  Prescribe Torsemide 20 mg daily with the ability to increase to 40 mg daily for weight gain of 3 pounds in 1 day or 5 pounds in a week. All immunizations and health maintenance protocols were reviewed with the patient and needed orders were placed.  Iron deficiency anemia-is followed by oncology and has periodic iron infusions.  Appropriate screening laboratory values were ordered for the patient including screening of hyperlipidemia, renal function and hepatic function.  Medication reconciliation,  past medical history, social history, problem list and allergies were reviewed in detail with the patient  Goals were established with regard to weight loss, exercise, and  diet in compliance with  medications   Review of Systems     Objective:   Physical Exam        Assessment & Plan:

## 2021-10-10 DIAGNOSIS — G4733 Obstructive sleep apnea (adult) (pediatric): Secondary | ICD-10-CM | POA: Diagnosis not present

## 2021-10-22 DIAGNOSIS — G4733 Obstructive sleep apnea (adult) (pediatric): Secondary | ICD-10-CM | POA: Diagnosis not present

## 2021-11-02 ENCOUNTER — Telehealth: Payer: Self-pay

## 2021-11-02 ENCOUNTER — Telehealth: Payer: Medicare Other | Admitting: Adult Health

## 2021-11-02 NOTE — Telephone Encounter (Signed)
Pt was called 11/02/2020 stating that appointment would be canceled for today. Pt advise previously that [facility] was no longer an option for her as Tommi Rumps has not seen her in over 2 years. Pt stated that she needed the appt. In order to keep her wheelchair. Pt advised that she would still need to come into the office. Pt asked to speak with the office manager but advised that office manager was not in the office. Pt was however transferred to clinical manager for her concerns or issues. Per clinical manager pt will call sometime this week to schedule an in ov. Pt claimed that her son is the only one who can help her but she will have to go by her sons schedule. No further action needed. Tommi Rumps advised of update verbally.

## 2021-11-05 ENCOUNTER — Telehealth: Payer: Self-pay | Admitting: Cardiology

## 2021-11-05 NOTE — Telephone Encounter (Signed)
Spoke with patient who reports she has had a significant increase in weight and lower extremity edema bilateraly (worse on left side), also SOB with activity.  She has not been taking her Torsemide since October when she had a kidney stone.  Advised pt to restart Torsemide as RXed and to have lab (BMP) ASAP.  She has difficulty with transportation but reports an upcoming appt at the end of the month with Dr Lenna Gilford.  She will have lab work at that time.  She is aware to c/b if no improvement in s/s, wt gain or edema.

## 2021-11-05 NOTE — Telephone Encounter (Signed)
Pt c/o swelling: STAT is pt has developed SOB within 24 hours  If swelling, where is the swelling located? Legs and feet, but L leg is more swollen than R  How much weight have you gained and in what time span? Not sure.   Have you gained 3 pounds in a day or 5 pounds in a week?  Do you have a log of your daily weights (if so, list)? No  Are you currently taking a fluid pill? No.   Are you currently SOB? No. Pt is only SOB when she gets up and starts moving around  Have you traveled recently? No   Patient said she went to the hospital in October and was having Kidney Stones. The hospital advised her to stop taking her lasix at that time. She wanted to know what to do

## 2021-11-10 DIAGNOSIS — G4733 Obstructive sleep apnea (adult) (pediatric): Secondary | ICD-10-CM | POA: Diagnosis not present

## 2021-11-16 DIAGNOSIS — I5030 Unspecified diastolic (congestive) heart failure: Secondary | ICD-10-CM | POA: Diagnosis not present

## 2021-11-16 DIAGNOSIS — D869 Sarcoidosis, unspecified: Secondary | ICD-10-CM | POA: Diagnosis not present

## 2021-11-16 DIAGNOSIS — M1712 Unilateral primary osteoarthritis, left knee: Secondary | ICD-10-CM | POA: Diagnosis not present

## 2021-11-16 DIAGNOSIS — R918 Other nonspecific abnormal finding of lung field: Secondary | ICD-10-CM | POA: Diagnosis not present

## 2021-11-16 DIAGNOSIS — J441 Chronic obstructive pulmonary disease with (acute) exacerbation: Secondary | ICD-10-CM | POA: Diagnosis not present

## 2021-11-18 ENCOUNTER — Encounter: Payer: Self-pay | Admitting: Hematology

## 2021-11-22 ENCOUNTER — Ambulatory Visit (INDEPENDENT_AMBULATORY_CARE_PROVIDER_SITE_OTHER): Payer: Medicare Other

## 2021-11-22 VITALS — Ht 66.0 in | Wt >= 6400 oz

## 2021-11-22 DIAGNOSIS — Z0000 Encounter for general adult medical examination without abnormal findings: Secondary | ICD-10-CM | POA: Diagnosis not present

## 2021-11-22 NOTE — Patient Instructions (Addendum)
[name] , Thank you for taking time to come for your Medicare Wellness Visit. I appreciate your ongoing commitment to your health goals. Please review the following plan we discussed and let me know if I can assist you in the future.   These are the goals we discussed:  Goals      Decrease inpatient Heart Failure admissions/ readmissions with in the year     Patient states just trying to survive.        This is a list of the screening recommended for you and due dates:  Health Maintenance  Topic Date Due   COVID-19 Vaccine (4 - Booster for Pfizer series) 12/29/2020   Pap Smear  11/22/2021*   Mammogram  12/01/2021*   Zoster (Shingles) Vaccine (1 of 2) 02/20/2022*   Colon Cancer Screening  11/22/2022*   Tetanus Vaccine  11/22/2022*   Hepatitis C Screening: USPSTF Recommendation to screen - Ages 18-79 yo.  11/22/2022*   HIV Screening  Completed   HPV Vaccine  Aged Out  *Topic was postponed. The date shown is not the original due date.    Opioid Pain Medicine Management Opioids are powerful medicines that are used to treat moderate to severe pain. When used for short periods of time, they can help you to: Sleep better. Do better in physical or occupational therapy. Feel better in the first few days after an injury. Recover from surgery. Opioids should be taken with the supervision of a trained health care provider. They should be taken for the shortest period of time possible. This is because opioids can be addictive, and the longer you take opioids, the greater your risk of addiction. This addiction can also be called opioid use disorder. What are the risks? Using opioid pain medicines for longer than 3 days increases your risk of side effects. Side effects include: Constipation. Nausea and vomiting. Breathing difficulties (respiratory depression). Drowsiness. Confusion. Opioid use disorder. Itching. Taking opioid pain medicine for a long period of time can affect your  ability to do daily tasks. It also puts you at risk for: Motor vehicle crashes. Depression. Suicide. Heart attack. Overdose, which can be life-threatening. What is a pain treatment plan? A pain treatment plan is an agreement between you and your health care provider. Pain is unique to each person, and treatments vary depending on your condition. To manage your pain, you and your health care provider need to work together. To help you do this: Discuss the goals of your treatment, including how much pain you might expect to have and how you will manage the pain. Review the risks and benefits of taking opioid medicines. Remember that a good treatment plan uses more than one approach and minimizes the chance of side effects. Be honest about the amount of medicines you take and about any drug or alcohol use. Get pain medicine prescriptions from only one health care provider. Pain can be managed with many types of alternative treatments. Ask your health care provider to refer you to one or more specialists who can help you manage pain through: Physical or occupational therapy. Counseling (cognitive behavioral therapy). Good nutrition. Biofeedback. Massage. Meditation. Non-opioid medicine. Following a gentle exercise program. How to use opioid pain medicine Taking medicine Take your pain medicine exactly as told by your health care provider. Take it only when you need it. If your pain gets less severe, you may take less than your prescribed dose if your health care provider approves. If you are not having pain,  do nottake pain medicine unless your health care provider tells you to take it. If your pain is severe, do nottry to treat it yourself by taking more pills than instructed on your prescription. Contact your health care provider for help. Write down the times when you take your pain medicine. It is easy to become confused while on pain medicine. Writing the time can help you avoid  overdose. Take other over-the-counter or prescription medicines only as told by your health care provider. Keeping yourself and others safe  While you are taking opioid pain medicine: Do not drive, use machinery, or power tools. Do not sign legal documents. Do not drink alcohol. Do not take sleeping pills. Do not supervise children by yourself. Do not do activities that require climbing or being in high places. Do not go to a lake, river, ocean, spa, or swimming pool. Do not share your pain medicine with anyone. Keep pain medicine in a locked cabinet or in a secure area where pets and children cannot reach it. Stopping your use of opioids If you have been taking opioid medicine for more than a few weeks, you may need to slowly decrease (taper) how much you take until you stop completely. Tapering your use of opioids can decrease your risk of symptoms of withdrawal, such as: Pain and cramping in the abdomen. Nausea. Sweating. Sleepiness. Restlessness. Uncontrollable shaking (tremors). Cravings for the medicine. Do not attempt to taper your use of opioids on your own. Talk with your health care provider about how to do this. Your health care provider may prescribe a step-down schedule based on how much medicine you are taking and how long you have been taking it. Getting rid of leftover pills Do not save any leftover pills. Get rid of leftover pills safely by: Taking the medicine to a prescription take-back program. This is usually offered by the county or law enforcement. Bringing them to a pharmacy that has a drug disposal container. Flushing them down the toilet. Check the label or package insert of your medicine to see whether this is safe to do. Throwing them out in the trash. Check the label or package insert of your medicine to see whether this is safe to do. If it is safe to throw it out, remove the medicine from the original container, put it into a sealable bag or container, and  mix it with used coffee grounds, food scraps, dirt, or cat litter before putting it in the trash. Follow these instructions at home: Activity Do exercises as told by your health care provider. Avoid activities that make your pain worse. Return to your normal activities as told by your health care provider. Ask your health care provider what activities are safe for you. General instructions You may need to take these actions to prevent or treat constipation: Drink enough fluid to keep your urine pale yellow. Take over-the-counter or prescription medicines. Eat foods that are high in fiber, such as beans, whole grains, and fresh fruits and vegetables. Limit foods that are high in fat and processed sugars, such as fried or sweet foods. Keep all follow-up visits. This is important. Where to find support If you have been taking opioids for a long time, you may benefit from receiving support for quitting from a local support group or counselor. Ask your health care provider for a referral to these resources in your area. Where to find more information Centers for Disease Control and Prevention (CDC): http://www.wolf.info/ [place]. Food and Drug Administration (FDA): [place]  Get help right away if: You may have taken too much of an opioid (overdosed). Common symptoms of an overdose: Your breathing is slower or more shallow than normal. You have a very slow heartbeat (pulse). You have slurred speech. You have nausea and vomiting. Your pupils become very small. You have other potential symptoms: You are very confused. You faint or feel like you will faint. You have cold, clammy skin. You have blue lips or fingernails. You have thoughts of harming yourself or harming others. These symptoms may represent a serious problem that is an emergency. Do not wait to see if the symptoms will go away. Get medical help right away. Call your local emergency services (911 in the [place] Do not drive yourself to the  hospital.  If you ever feel like you may hurt yourself or others, or have thoughts about taking your own life, get help right away. Go to your nearest emergency department or: Call your local emergency services (911 in the [place] Call the [facility] 903-379-2813 in the [place] Call a suicide crisis helpline, such as the [facility] at 716-811-2924 or 988 in the [place]. This is open 24 hours a day in the [place] Text the Crisis Text Line at 709 493 2919 (in the [place].). Summary Opioid medicines can help you manage moderate to severe pain for a short period of time. A pain treatment plan is an agreement between you and your health care provider. Discuss the goals of your treatment, including how much pain you might expect to have and how you will manage the pain. If you think that you or someone else may have taken too much of an opioid, get medical help right away. This information is not intended to replace advice given to you by your health care provider. Make sure you discuss any questions you have with your health care provider. Document Revised: 05/12/2021 Document Reviewed: 01/27/2021 Elsevier Patient Education  [place].  Advanced directives: Yes  Conditions/risks identified: None  Next appointment: Follow up in one year for your annual wellness visit   Preventive Care 65 Years and Older, Female Preventive care refers to lifestyle choices and visits with your health care provider that can promote health and wellness. What does preventive care include? A yearly physical exam. This is also called an annual well check. Dental exams once or twice a year. Routine eye exams. Ask your health care provider how often you should have your eyes checked. Personal lifestyle choices, including: Daily care of your teeth and gums. Regular physical activity. Eating a healthy diet. Avoiding tobacco and drug use. Limiting alcohol use. Practicing safe  sex. Taking low-dose aspirin every day. Taking vitamin and mineral supplements as recommended by your health care provider. What happens during an annual well check? The services and screenings done by your health care provider during your annual well check will depend on your age, overall health, lifestyle risk factors, and family history of disease. Counseling  Your health care provider may ask you questions about your: Alcohol use. Tobacco use. Drug use. Emotional well-being. Home and relationship well-being. Sexual activity. Eating habits. History of falls. Memory and ability to understand (cognition). Work and work Statistician. Reproductive health. Screening  You may have the following tests or measurements: Height, weight, and BMI. Blood pressure. Lipid and cholesterol levels. These may be checked every 5 years, or more frequently if you are over 50 years old. Skin check. Lung cancer screening. You may have this screening every  year starting at age 61 if you have a 30-pack-year history of smoking and currently smoke or have quit within the past 15 years. Fecal occult blood test (FOBT) of the stool. You may have this test every year starting at age 37. Flexible sigmoidoscopy or colonoscopy. You may have a sigmoidoscopy every 5 years or a colonoscopy every 10 years starting at age 101. Hepatitis C blood test. Hepatitis B blood test. Sexually transmitted disease (STD) testing. Diabetes screening. This is done by checking your blood sugar (glucose) after you have not eaten for a while (fasting). You may have this done every 1-3 years. Bone density scan. This is done to screen for osteoporosis. You may have this done starting at age 39. Mammogram. This may be done every 1-2 years. Talk to your health care provider about how often you should have regular mammograms. Talk with your health care provider about your test results, treatment options, and if necessary, the need for more  tests. Vaccines  Your health care provider may recommend certain vaccines, such as: Influenza vaccine. This is recommended every year. Tetanus, diphtheria, and acellular pertussis (Tdap, Td) vaccine. You may need a Td booster every 10 years. Zoster vaccine. You may need this after age 39. Pneumococcal 13-valent conjugate (PCV13) vaccine. One dose is recommended after age 63. Pneumococcal polysaccharide (PPSV23) vaccine. One dose is recommended after age 32. Talk to your health care provider about which screenings and vaccines you need and how often you need them. This information is not intended to replace advice given to you by your health care provider. Make sure you discuss any questions you have with your health care provider. Document Released: 11/13/2015 Document Revised: 07/06/2016 Document Reviewed: 08/18/2015 Elsevier Interactive Patient Education  2017 [place] Prevention in the Home Falls can cause injuries. They can happen to people of all ages. There are many things you can do to make your home safe and to help prevent falls. What can I do on the outside of my home? Regularly fix the edges of walkways and driveways and fix any cracks. Remove anything that might make you trip as you walk through a door, such as a raised step or threshold. Trim any bushes or trees on the path to your home. Use bright outdoor lighting. Clear any walking paths of anything that might make someone trip, such as rocks or tools. Regularly check to see if handrails are loose or broken. Make sure that both sides of any steps have handrails. Any raised decks and porches should have guardrails on the edges. Have any leaves, snow, or ice cleared regularly. Use sand or salt on walking paths during winter. Clean up any spills in your garage right away. This includes oil or grease spills. What can I do in the bathroom? Use night lights. Install grab bars by the toilet and in the tub and shower.  Do not use towel bars as grab bars. Use non-skid mats or decals in the tub or shower. If you need to sit down in the shower, use a plastic, non-slip stool. Keep the floor dry. Clean up any water that spills on the floor as soon as it happens. Remove soap buildup in the tub or shower regularly. Attach bath mats securely with double-sided non-slip rug tape. Do not have throw rugs and other things on the floor that can make you trip. What can I do in the bedroom? Use night lights. Make sure that you have a light by your bed that  is easy to reach. Do not use any sheets or blankets that are too big for your bed. They should not hang down onto the floor. Have a firm chair that has side arms. You can use this for support while you get dressed. Do not have throw rugs and other things on the floor that can make you trip. What can I do in the kitchen? Clean up any spills right away. Avoid walking on wet floors. Keep items that you use a lot in easy-to-reach places. If you need to reach something above you, use a strong step stool that has a grab bar. Keep electrical cords out of the way. Do not use floor polish or wax that makes floors slippery. If you must use wax, use non-skid floor wax. Do not have throw rugs and other things on the floor that can make you trip. What can I do with my stairs? Do not leave any items on the stairs. Make sure that there are handrails on both sides of the stairs and use them. Fix handrails that are broken or loose. Make sure that handrails are as long as the stairways. Check any carpeting to make sure that it is firmly attached to the stairs. Fix any carpet that is loose or worn. Avoid having throw rugs at the top or bottom of the stairs. If you do have throw rugs, attach them to the floor with carpet tape. Make sure that you have a light switch at the top of the stairs and the bottom of the stairs. If you do not have them, ask someone to add them for you. What else  can I do to help prevent falls? Wear shoes that: Do not have high heels. Have rubber bottoms. Are comfortable and fit you well. Are closed at the toe. Do not wear sandals. If you use a stepladder: Make sure that it is fully opened. Do not climb a closed stepladder. Make sure that both sides of the stepladder are locked into place. Ask someone to hold it for you, if possible. Clearly mark and make sure that you can see: Any grab bars or handrails. First and last steps. Where the edge of each step is. Use tools that help you move around (mobility aids) if they are needed. These include: Canes. Walkers. Scooters. Crutches. Turn on the lights when you go into a dark area. Replace any light bulbs as soon as they burn out. Set up your furniture so you have a clear path. Avoid moving your furniture around. If any of your floors are uneven, fix them. If there are any pets around you, be aware of where they are. Review your medicines with your doctor. Some medicines can make you feel dizzy. This can increase your chance of falling. Ask your doctor what other things that you can do to help prevent falls. This information is not intended to replace advice given to you by your health care provider. Make sure you discuss any questions you have with your health care provider. Document Released: 08/13/2009 Document Revised: 03/24/2016 Document Reviewed: 11/21/2014 Elsevier Interactive Patient Education  2017 [employer].

## 2021-11-22 NOTE — Progress Notes (Signed)
Subjective:   [name] is a 56 y.o. female who presents for Medicare Annual (Subsequent) preventive examination.  Review of Systems    No ROS Cardiac Risk Factors include: advanced age (>25men, >68 women);hypertension    Objective:    Today's Vitals   11/22/21 0814  Weight: (!) 482 lb (218.6 kg)  Height: 5\' 6"  (1.676 m)   Body mass index is 77.8 kg/m.  Advanced Directives 11/22/2021 08/03/2021 12/04/2020 08/11/2020 08/10/2020 12/05/2019 11/06/2019  Does Patient Have a Medical Advance Directive? Yes Yes Yes Yes No Yes Yes  Type of Paramedic of [place];Living will - [place];Living will [place];Living will - [place];Living will Living will  Does patient want to make changes to medical advance directive? No - Patient declined - - No - Patient declined - No - Patient declined Yes (ED - Information included in AVS)  Copy of [place] in Chart? No - copy requested - Yes - validated most recent copy scanned in chart (See row information) No - copy requested - Yes - validated most recent copy scanned in chart (See row information) -  Would patient like information on creating a medical advance directive? - - - No - Patient declined No - Patient declined - -  Pre-existing out of facility DNR order (yellow form or pink MOST form) - - - - - - -    Current Medications (verified) Outpatient Encounter Medications as of 11/22/2021  Medication Sig   acetaminophen (TYLENOL) 500 MG tablet Take 1 tablet (500 mg total) by mouth every 6 (six) hours as needed.   albuterol (VENTOLIN HFA) 108 (90 Base) MCG/ACT inhaler INHALE 1-2 PUFFS BY MOUTH EVERY 6 HOURS AS NEEDED FOR WHEEZE OR SHORTNESS OF BREATH   atorvastatin (LIPITOR) 40 MG tablet TAKE 1 TABLET (40 MG TOTAL) BY MOUTH DAILY AT 6 PM.   B-D TB SYRINGE 1CC/27GX1/2" 27G X 1/2" 1 ML MISC USE ONCE A WEEK TO INJECT METHOTREXATE   calcium  citrate-vitamin D (CITRACAL+D) 315-200 MG-UNIT tablet Take 1 tablet by mouth daily.   cetirizine (ZYRTEC) 10 MG tablet Take 10 mg by mouth at bedtime as needed for allergies.   famotidine (PEPCID) 40 MG tablet TAKE 1 TABLET BY MOUTH TWICE A DAY   folic acid (FOLVITE) 1 MG tablet Take 1 mg by mouth daily.   HYDROcodone-acetaminophen (NORCO) 10-325 MG tablet 1 tablet as needed   losartan (COZAAR) 100 MG tablet TAKE 1 TABLET (100 MG TOTAL) DAILY BY MOUTH.   methotrexate 50 MG/2ML injection Inject 20 mg into the vein once a week. Dose is 0.8 mL (20mg ). Take on Fridays   metoprolol succinate (TOPROL-XL) 25 MG 24 hr tablet TAKE 1 TABLET BY MOUTH EVERY DAY   NON FORMULARY 2 liter of oxygen   ondansetron (ZOFRAN ODT) 8 MG disintegrating tablet Take 1 tablet (8 mg total) by mouth every 8 (eight) hours as needed for nausea.   predniSONE (DELTASONE) 10 MG tablet Take 1 tablet (10 mg total) by mouth daily.   torsemide (DEMADEX) 20 MG tablet Take 20 mg by mouth daily. Take two tablets ( 40 mg) for increase wt gain of 3 lbs in one day or 5 lbs in one week.   triamcinolone ointment (KENALOG) 0.1 % Apply 1 application topically 2 (two) times daily. For Sarcoidosis   vitamin B-12 1000 MCG tablet Take 1 tablet (1,000 mcg total) by mouth daily.   No facility-administered encounter medications  on file as of 11/22/2021.    Allergies (verified) Tape   History: Past Medical History:  Diagnosis Date   Anemia    Arthritis    hands, shoulders, no meds   CHF (congestive heart failure) (HCC)    EF60-65%   Chronic hyperventilation syndrome    w/ obesity tx with albuterol inhaler and oxygen 2L   COPD (chronic obstructive pulmonary disease) (HCC)    uses oxygen 2 L   Gallstones    GERD (gastroesophageal reflux disease)    diet controlled - no meds   H/O hiatal hernia    Hypertension    Hypothyroidism    Kidney stones    Morbid obesity ([place])    Pneumonia    hoispitalized in 08/2011   Sarcoidosis     Seasonal allergies    Sleep apnea    uses CPAP machine    Past Surgical History:  Procedure Laterality Date   CESAREAN SECTION  1994   x 1   CHOLECYSTECTOMY  2000   DILATION AND CURETTAGE OF UTERUS N/A 08/09/2016   Procedure: DILATATION AND CURETTAGE;  Surgeon: Everitt Amber, MD;  Location: [facility];  Service: Gynecology;  Laterality: N/A;   HYSTEROSCOPY WITH D & C  12/27/2011   Procedure: DILATATION AND CURETTAGE /HYSTEROSCOPY;  Surgeon: Maeola Sarah. Landry Mellow, MD;  Location: [facility];  Service: Gynecology;;   I and D of abcess  05/2011   INTRAUTERINE DEVICE (IUD) INSERTION N/A 08/09/2016   Procedure: INTRAUTERINE DEVICE (IUD) INSERTION;  Surgeon: Everitt Amber, MD;  Location: [facility];  Service: Gynecology;  Laterality: N/A;   LUNG BIOPSY     uterine abletion     Family History  Problem Relation Age of Onset   Diabetes Father    Cancer Father 83       colon cancer    Diabetes Brother    Deep vein thrombosis Mother    Aneurysm Sister        d/o brain aneurysm   Social History   Socioeconomic History   Marital status: Single    Spouse name: Not on file   Number of children: 1   Years of education: 16   Highest education level: Bachelor's degree (e.g., BA, AB, BS)  Occupational History   Occupation: disability   Tobacco Use   Smoking status: Never   Smokeless tobacco: Never  Vaping Use   Vaping Use: Never used  Substance and Sexual Activity   Alcohol use: Yes    Comment: occasionally   Drug use: No   Sexual activity: Never    Birth control/protection: None  Other Topics Concern   Not on file  Social History Narrative   Son lives with patient.   Divorced for eight or nine years   disabled   Social Determinants of Radio broadcast assistant Strain: Low Risk    Difficulty of Paying Living Expenses: Not hard at all  Food Insecurity: No Food Insecurity   Worried About Charity fundraiser in the Last Year: Never true   Arboriculturist in the Last Year: Never true  Transportation  Needs: No Transportation Needs   Lack of Transportation (Medical): No   Lack of Transportation (Non-Medical): No  Physical Activity: Inactive   Days of Exercise per Week: 0 days   Minutes of Exercise per Session: 0 min  Stress: No Stress Concern Present   Feeling of Stress : Not at all  Social Connections: Moderately Isolated   Frequency of Communication with  Friends and Family: More than three times a week   Frequency of Social Gatherings with Friends and Family: More than three times a week   Attends Religious Services: Never   Marine scientist or Organizations: Yes   Attends Archivist Meetings: Never   Marital Status: Divorced     Clinical Intake:  Diabetic? No  Interpreter Needed?: No Activities of Daily Living In your present state of health, do you have any difficulty performing the following activities: 11/22/2021 12/04/2020  Hearing? N Y  Comment - has some issues with certain pitches  Vision? N Y  Comment - needs cataract extraction has visual issues  Difficulty concentrating or making decisions? N N  Walking or climbing stairs? N Y  Dressing or bathing? N N  Doing errands, shopping? N N  Preparing Food and eating ? N Y  Using the Toilet? N N  In the past six months, have you accidently leaked urine? Y Y  Comment Patient wears pads -  Do you have problems with loss of bowel control? N N  Managing your Medications? N N  Managing your Finances? N N  Housekeeping or managing your Housekeeping? Y N  Comment Aide assist -  Some recent data might be hidden    Patient Care Team: [name], NP as PCP - General (Family Medicine) Jerline Pain, MD as PCP - Cardiology (Cardiology) Wonda Horner, MD (Gastroenterology) Rigoberto Noel, MD as Consulting Physician (Pulmonary Disease) Gavin Pound, MD as Consulting Physician (Rheumatology)  Indicate any recent Medical Services you may have received from other than [facility] providers in the past year  (date may be approximate).     Assessment:   This is a routine wellness examination for [name].  Virtual Visit via Telephone Note  I connected with  [name] on 11/22/21 at  8:15 AM EST by telephone and verified that I am speaking with the correct person using two identifiers.  Location: Patient: Home Provider: Office Persons participating in the virtual visit: patient/Nurse Health Advisor   I discussed the limitations, risks, security and privacy concerns of performing an evaluation and management service by telephone and the availability of in person appointments. The patient expressed understanding and agreed to proceed.  Interactive audio and video telecommunications were attempted between this nurse and patient, however failed, due to patient having technical difficulties OR patient did not have access to video capability.  We continued and completed visit with audio only.  Some vital signs may be absent or patient reported.   Kim Peaches, LPN   Hearing/Vision screen Hearing Screening - Comments:: No difficulty hearing Vision Screening - Comments:: Wears glasses. [facility]  Dietary issues and exercise activities discussed: Current Exercise Habits: The patient does not participate in regular exercise at present, Exercise limited by: None identified   Goals Addressed             This Visit's Progress    Decrease inpatient Heart Failure admissions/ readmissions with in the year       Patient states just trying to survive.       Depression Screen PHQ 2/9 Scores 11/22/2021 12/04/2020 11/06/2019 05/04/2018 02/24/2016 02/24/2016 02/24/2016  PHQ - 2 Score 0 0 0 0 0 0 0  PHQ- 9 Score - 4 - - - - -    Fall Risk Fall Risk  11/22/2021 12/04/2020 11/06/2019 05/04/2018 02/24/2016  Falls in the past year? 0 0 0 No No  Number  falls in past yr: 0 0 - - -  Injury with Fall? 0 0 - - -  Risk for fall due to : - Impaired balance/gait;Impaired  mobility Medication side effect - -  Follow up - Falls evaluation completed;Falls prevention discussed - - -    FALL RISK PREVENTION PERTAINING TO THE HOME:  Any stairs in or around the home? No  If so, are there any without handrails? No  Home free of loose throw rugs in walkways, pet beds, electrical cords, etc? Yes  Adequate lighting in your home to reduce risk of falls? Yes   ASSISTIVE DEVICES UTILIZED TO PREVENT FALLS:  Life alert? No  Use of a cane, walker or w/c? Yes  Grab bars in the bathroom? No  Shower chair or bench in shower? No  Elevated toilet seat or a handicapped toilet? No   TIMED UP AND GO:  Was the test performed? No . Audio Visit  Cognitive Function:     6CIT Screen 11/22/2021 11/06/2019  What Year? 0 points 0 points  What month? 0 points 0 points  What time? 0 points 0 points  Count back from 20 0 points 0 points  Months in reverse 0 points 0 points  Repeat phrase 0 points 0 points  Total Score 0 0    Immunizations Immunization History  Administered Date(s) Administered   Influenza Split 08/22/2011   Influenza Whole 07/31/2012   Influenza,inj,Quad PF,6+ Mos 09/16/2013, 07/11/2014, 07/11/2015, 09/04/2017, 11/07/2018, 08/19/2019, 08/13/2020   Influenza-Unspecified 08/18/2016   PFIZER(Purple Top)SARS-COV-2 Vaccination 01/16/2020, 02/06/2020, 11/03/2020   Pneumococcal Polysaccharide-23 08/19/2019   Pneumococcal-Unspecified 08/01/2011    TDAP status: Due, Education has been provided regarding the importance of this vaccine. Advised may receive this vaccine at local pharmacy or Health Dept. Aware to provide a copy of the vaccination record if obtained from local pharmacy or Health Dept. Verbalized acceptance and understanding.   Covid-19 vaccine status: Completed vaccines  Qualifies for Shingles Vaccine? Yes   Zostavax completed No   Shingrix Completed?: No.    Education has been provided regarding the importance of this vaccine. Patient has been  advised to call insurance company to determine out of pocket expense if they have not yet received this vaccine. Advised may also receive vaccine at local pharmacy or Health Dept. Verbalized acceptance and understanding.  Screening Tests Health Maintenance  Topic Date Due   COVID-19 Vaccine (4 - Booster for Pfizer series) 12/29/2020   PAP SMEAR-Modifier  11/22/2021 (Originally 11/01/2019)   MAMMOGRAM  12/01/2021 (Originally 10/03/2015)   Zoster Vaccines- Shingrix (1 of 2) 02/20/2022 (Originally 10/02/1984)   COLONOSCOPY (Pts 45-48yrs Insurance coverage will need to be confirmed)  11/22/2022 (Originally 10/02/2010)   TETANUS/TDAP  11/22/2022 (Originally 10/02/1984)   Hepatitis C Screening  11/22/2022 (Originally 10/03/1983)   HIV Screening  Completed   HPV VACCINES  Aged Out    Health Maintenance  Health Maintenance Due  Topic Date Due   COVID-19 Vaccine (4 - Booster for [place] series) 12/29/2020   Colonoscopy: Patient deferred  Mammogram: Patient deferred  Additional Screening:  Hepatitis C Screening: does qualify; Completed Patient deferred  Vision Screening: Recommended annual ophthalmology exams for early detection of glaucoma and other disorders of the eye. Is the patient up to date with their annual eye exam?  Yes  Who is the provider or what is the name of the office in which the patient attends annual eye exams? Followed by [facility]  Dental Screening: Recommended annual dental exams for proper  oral hygiene  Community Resource Referral / Chronic Care Management:  CRR required this visit?  No   CCM required this visit?  No      Plan:     I have personally reviewed and noted the following in the patients chart:   Medical and social history Use of alcohol, tobacco or illicit drugs  Current medications and supplements including opioid prescriptions.  Functional ability and status Nutritional status Physical activity Advanced directives List of other  physicians Hospitalizations, surgeries, and ER visits in previous 12 months Vitals Screenings to include cognitive, depression, and falls Referrals and appointments  In addition, I have reviewed and discussed with patient certain preventive protocols, quality metrics, and best practice recommendations. A written personalized care plan for preventive services as well as general preventive health recommendations were provided to patient.     Kim Peaches, LPN   05/08[birth year]   f

## 2021-12-10 ENCOUNTER — Other Ambulatory Visit: Payer: Self-pay | Admitting: *Deleted

## 2021-12-10 MED ORDER — TORSEMIDE 20 MG PO TABS: 20.0000 mg | ORAL_TABLET | Freq: Every day | ORAL | 0 refills | Status: DC

## 2021-12-10 NOTE — Telephone Encounter (Signed)
Pt called in requesting refill of Torsemide. Sent in 30 days supply, but pt made aware that she needed an appointment. Pt states that Dr. [name] knew of her situation and the troubles with her coming in the office, and was not able to make that walk, and that we don't have a wheelchair big enough for her.  I advised pt I would send a message to Dr. [name] to see how he wanted to pursue the further management of Torsemide, if he wanted to do another video visit or what and someone will let her know.

## 2021-12-11 DIAGNOSIS — G4733 Obstructive sleep apnea (adult) (pediatric): Secondary | ICD-10-CM | POA: Diagnosis not present

## 2021-12-14 ENCOUNTER — Encounter: Payer: Self-pay | Admitting: Adult Health

## 2021-12-14 ENCOUNTER — Ambulatory Visit (INDEPENDENT_AMBULATORY_CARE_PROVIDER_SITE_OTHER): Payer: Medicare Other | Admitting: Adult Health

## 2021-12-14 VITALS — BP 108/80 | HR 112 | Temp 98.9°F

## 2021-12-14 DIAGNOSIS — E038 Other specified hypothyroidism: Secondary | ICD-10-CM

## 2021-12-14 DIAGNOSIS — E785 Hyperlipidemia, unspecified: Secondary | ICD-10-CM

## 2021-12-14 DIAGNOSIS — G894 Chronic pain syndrome: Secondary | ICD-10-CM | POA: Diagnosis not present

## 2021-12-14 DIAGNOSIS — K219 Gastro-esophageal reflux disease without esophagitis: Secondary | ICD-10-CM | POA: Diagnosis not present

## 2021-12-14 DIAGNOSIS — G4733 Obstructive sleep apnea (adult) (pediatric): Secondary | ICD-10-CM

## 2021-12-14 DIAGNOSIS — I10 Essential (primary) hypertension: Secondary | ICD-10-CM

## 2021-12-14 DIAGNOSIS — D860 Sarcoidosis of lung: Secondary | ICD-10-CM

## 2021-12-14 DIAGNOSIS — I5033 Acute on chronic diastolic (congestive) heart failure: Secondary | ICD-10-CM | POA: Diagnosis not present

## 2021-12-14 DIAGNOSIS — Z23 Encounter for immunization: Secondary | ICD-10-CM | POA: Diagnosis not present

## 2021-12-14 DIAGNOSIS — M25562 Pain in left knee: Secondary | ICD-10-CM | POA: Diagnosis not present

## 2021-12-14 DIAGNOSIS — M2550 Pain in unspecified joint: Secondary | ICD-10-CM | POA: Diagnosis not present

## 2021-12-14 DIAGNOSIS — Z79899 Other long term (current) drug therapy: Secondary | ICD-10-CM | POA: Diagnosis not present

## 2021-12-14 DIAGNOSIS — M064 Inflammatory polyarthropathy: Secondary | ICD-10-CM | POA: Diagnosis not present

## 2021-12-14 DIAGNOSIS — M159 Polyosteoarthritis, unspecified: Secondary | ICD-10-CM | POA: Diagnosis not present

## 2021-12-14 MED ORDER — METOPROLOL SUCCINATE ER 25 MG PO TB24: 25.0000 mg | ORAL_TABLET | Freq: Every day | ORAL | 3 refills | Status: DC

## 2021-12-14 MED ORDER — TORSEMIDE 20 MG PO TABS: 20.0000 mg | ORAL_TABLET | Freq: Every day | ORAL | 6 refills | Status: DC

## 2021-12-14 MED ORDER — ATORVASTATIN CALCIUM 40 MG PO TABS: 40.0000 mg | ORAL_TABLET | Freq: Every day | ORAL | 3 refills | Status: DC

## 2021-12-14 NOTE — Progress Notes (Signed)
Subjective:    Patient ID: [name], female    DOB: 04/23/1966, 56 y.o.   MRN: [id number]  HPI  Patient presents for yearly preventative medicine examination. She is a 56 year old female who  has a past medical history of Anemia, Arthritis, CHF (congestive heart failure) ([place]), Chronic hyperventilation syndrome, COPD (chronic obstructive pulmonary disease) ([place]), Gallstones, GERD (gastroesophageal reflux disease), H/O hiatal hernia, Hypertension, Hypothyroidism, Kidney stones, Morbid obesity ([place]), Pneumonia, Sarcoidosis, Seasonal allergies, and Sleep apnea.  Treatment Team  Jerline Pain, MD as PCP - Cardiology (Cardiology) Wonda Horner, MD (Gastroenterology) Rigoberto Noel, MD as Consulting Physician (Pulmonary Disease) Gavin Pound, MD as Consulting Physician (Rheumatology)  Essential Hypertension - Controlled with Cozaar 100 mg daily and Metoprolol XR 25 mg daily. She reports that she checks her blood pressures at home and has been having readings in the 664 systolic BP Readings from Last 3 Encounters:  12/14/21 108/80  09/30/21 (!) 148/60  08/03/21 (!) 153/78   Sarcoidosis/OSA - Managed by Pulmonary and Rheumatology.  Rheumatology provided chronic steroids and injectable methotrexate.  She remains on 2 L 24/7.  She uses BiPAP at night with great compliance  Chronic Diastolic heart failure - managed by cardiology. Currently prescribed Torsemide 20 mg daily with instructions to increase to 40 mg if weight up over three pounds in a day.   Iron Deficiency Anemia - managed by oncology with iron infusion.  Last iron level thousand 22. Lab Results  Component Value Date   IRON 27 (L) 09/07/2020   TIBC 320 09/07/2020   FERRITIN 51 09/07/2020   Vitamin D Deficiency - takes Vitamin D supplement daily  Last vitamin D Lab Results  Component Value Date   VD25OH 16.66 (L) 05/08/2019   Vitamin B 12 - takes oral B12 daily.  Lab Results  Component Value Date    [id number] 638 08/13/2020   Obesity -leads a sedentary lifestyle.  Continues to be the cause of many of her chronic health issues.  Currently has a motorized wheelchair, no longer wants this as she does not feel stable on it.  She would like bariatric wheelchair so she has something to hold onto. Wt Readings from Last 3 Encounters:  11/22/21 (!) 482 lb (218.6 kg)  08/03/21 (!) 505 lb (229.1 kg)  11/24/20 (!) 487 lb (220.9 kg)     All immunizations and health maintenance protocols were reviewed with the patient and needed orders were placed.  Appropriate screening laboratory values were ordered for the patient including screening of hyperlipidemia, renal function and hepatic function.   Medication reconciliation,  past medical history, social history, problem list and allergies were reviewed in detail with the patient  Goals were established with regard to weight loss, exercise, and  diet in compliance with medications  Review of Systems  Constitutional: Negative.   HENT: Negative.    Eyes: Negative.   Respiratory:  Positive for shortness of breath.   Cardiovascular:  Positive for leg swelling.  Gastrointestinal: Negative.   Endocrine: Negative.   Genitourinary: Negative.   Musculoskeletal:  Positive for arthralgias, back pain and gait problem.  Skin: Negative.   Allergic/Immunologic: Negative.   Hematological: Negative.   Psychiatric/Behavioral: Negative.    Past Medical History:  Diagnosis Date   Anemia    Arthritis    hands, shoulders, no meds   CHF (congestive heart failure) ([facility])    EF60-65%   Chronic hyperventilation syndrome    w/ obesity tx with albuterol inhaler and  oxygen 2L   COPD (chronic obstructive pulmonary disease) ([facility])    uses oxygen 2 L   Gallstones    GERD (gastroesophageal reflux disease)    diet controlled - no meds   H/O hiatal hernia    Hypertension    Hypothyroidism    Kidney stones    Morbid obesity ([place])    Pneumonia    hoispitalized in  08/2011   Sarcoidosis    Seasonal allergies    Sleep apnea    uses CPAP machine     Social History   Socioeconomic History   Marital status: Single    Spouse name: Not on file   Number of children: 1   Years of education: 16   Highest education level: Bachelor's degree (e.g., BA, AB, BS)  Occupational History   Occupation: disability   Tobacco Use   Smoking status: Never   Smokeless tobacco: Never  Vaping Use   Vaping Use: Never used  Substance and Sexual Activity   Alcohol use: Yes    Comment: occasionally   Drug use: No   Sexual activity: Never    Birth control/protection: None  Other Topics Concern   Not on file  Social History Narrative   Son lives with patient.   Divorced for eight or nine years   disabled   Social Determinants of Radio broadcast assistant Strain: Low Risk    Difficulty of Paying Living Expenses: Not hard at all  Food Insecurity: No Food Insecurity   Worried About Charity fundraiser in the Last Year: Never true   Arboriculturist in the Last Year: Never true  Transportation Needs: No Transportation Needs   Lack of Transportation (Medical): No   Lack of Transportation (Non-Medical): No  Physical Activity: Inactive   Days of Exercise per Week: 0 days   Minutes of Exercise per Session: 0 min  Stress: No Stress Concern Present   Feeling of Stress : Not at all  Social Connections: Moderately Isolated   Frequency of Communication with Friends and Family: More than three times a week   Frequency of Social Gatherings with Friends and Family: More than three times a week   Attends Religious Services: Never   Marine scientist or Organizations: Yes   Attends Music therapist: Not on file   Marital Status: Divorced  Intimate Partner Violence: Not At Risk   Fear of Current or Ex-Partner: No   Emotionally Abused: No   Physically Abused: No   Sexually Abused: No    Past Surgical History:  Procedure Laterality Date    CESAREAN SECTION  1994   x 1   CHOLECYSTECTOMY  2000   DILATION AND CURETTAGE OF UTERUS N/A 08/09/2016   Procedure: DILATATION AND CURETTAGE;  Surgeon: Everitt Amber, MD;  Location: [facility];  Service: Gynecology;  Laterality: N/A;   HYSTEROSCOPY WITH D & C  12/27/2011   Procedure: DILATATION AND CURETTAGE /HYSTEROSCOPY;  Surgeon: Maeola Sarah. Landry Mellow, MD;  Location: [facility];  Service: Gynecology;;   I and D of abcess  05/2011   INTRAUTERINE DEVICE (IUD) INSERTION N/A 08/09/2016   Procedure: INTRAUTERINE DEVICE (IUD) INSERTION;  Surgeon: Everitt Amber, MD;  Location: [facility];  Service: Gynecology;  Laterality: N/A;   LUNG BIOPSY     uterine abletion      Family History  Problem Relation Age of Onset   Diabetes Father    Cancer Father 42       colon  cancer    Diabetes Brother    Deep vein thrombosis Mother    Aneurysm Sister        d/o brain aneurysm    Allergies  Allergen Reactions   Tape Itching    EKG Leads cause Skin burns and irritation after 24 hours+    Current Outpatient Medications on File Prior to Visit  Medication Sig Dispense Refill   acetaminophen (TYLENOL) 500 MG tablet Take 1 tablet (500 mg total) by mouth every 6 (six) hours as needed. 30 tablet 0   albuterol (VENTOLIN HFA) 108 (90 Base) MCG/ACT inhaler INHALE 1-2 PUFFS BY MOUTH EVERY 6 HOURS AS NEEDED FOR WHEEZE OR SHORTNESS OF BREATH 8.5 each 3   B-D TB SYRINGE 1CC/27GX1/2" 27G X 1/2" 1 ML MISC USE ONCE A WEEK TO INJECT METHOTREXATE     calcium citrate-vitamin D (CITRACAL+D) 315-200 MG-UNIT tablet Take 1 tablet by mouth daily.     cetirizine (ZYRTEC) 10 MG tablet Take 10 mg by mouth at bedtime as needed for allergies.     famotidine (PEPCID) 40 MG tablet TAKE 1 TABLET BY MOUTH TWICE A DAY 60 tablet 0   folic acid (FOLVITE) 1 MG tablet Take 1 mg by mouth daily.  3   HYDROcodone-acetaminophen (NORCO) 10-325 MG tablet 1 tablet as needed     losartan (COZAAR) 100 MG tablet TAKE 1 TABLET (100 MG TOTAL) DAILY BY MOUTH. 90 tablet 3    methotrexate 50 MG/2ML injection Inject 20 mg into the vein once a week. Dose is 0.8 mL (20mg ). Take on Fridays     NON FORMULARY 2 liter of oxygen     ondansetron (ZOFRAN ODT) 8 MG disintegrating tablet Take 1 tablet (8 mg total) by mouth every 8 (eight) hours as needed for nausea. 20 tablet 0   predniSONE (DELTASONE) 10 MG tablet Take 1 tablet (10 mg total) by mouth daily. 10 tablet 0   triamcinolone ointment (KENALOG) 0.1 % Apply 1 application topically 2 (two) times daily. For Sarcoidosis     vitamin B-12 1000 MCG tablet Take 1 tablet (1,000 mcg total) by mouth daily. 30 tablet 0   torsemide (DEMADEX) 20 MG tablet Take 1 tablet (20 mg total) by mouth daily. Take two tablets ( 40 mg) for increase wt gain of 3 lbs in one day or 5 lbs in one week. 30 tablet 6   No current facility-administered medications on file prior to visit.    BP 108/80    Pulse (!) 112    Temp 98.9 F (37.2 C) (Oral)    SpO2 91%       Objective:   Physical Exam Vitals and nursing note reviewed.  Constitutional:      Appearance: Normal appearance. She is obese.  HENT:     Head: Normocephalic and atraumatic.     Right Ear: Tympanic membrane, ear canal and external ear normal. There is no impacted cerumen.     Left Ear: Tympanic membrane, ear canal and external ear normal. There is no impacted cerumen.     Nose: Nose normal. No congestion or rhinorrhea.     Mouth/Throat:     Mouth: Mucous membranes are moist.     Pharynx: Oropharynx is clear.  Eyes:     Extraocular Movements: Extraocular movements intact.     Conjunctiva/sclera: Conjunctivae normal.     Pupils: Pupils are equal, round, and reactive to light.  Cardiovascular:     Rate and Rhythm: Normal rate and regular rhythm.  Pulses: Normal pulses.     Heart sounds: Normal heart sounds.  Pulmonary:     Effort: Pulmonary effort is normal.     Breath sounds: Normal breath sounds.     Comments: 2 L via [place]  Musculoskeletal:     Cervical back: Normal  range of motion and neck supple.     Right lower leg: Edema present.     Left lower leg: Edema present.  Skin:    General: Skin is warm and dry.  Neurological:     General: No focal deficit present.     Mental Status: She is alert and oriented to person, place, and time.  Psychiatric:        Attention and Perception: Attention and perception normal.        Mood and Affect: Mood normal.        Speech: Speech normal.        Behavior: Behavior normal.        Thought Content: Thought content normal.        Cognition and Memory: Cognition and memory normal.        Judgment: Judgment normal.      Assessment & Plan:  1. Hyperlipidemia, unspecified hyperlipidemia type  - CBC with Differential/Platelet; Future - Comprehensive metabolic panel; Future - Lipid panel; Future - Vitamin B12; Future - Ferritin; Future - TSH; Future - atorvastatin (LIPITOR) 40 MG tablet; Take 1 tablet (40 mg total) by mouth daily at 6 PM.  Dispense: 90 tablet; Refill: 3 - Hemoglobin A1c; Future - Hemoglobin A1c - TSH - Ferritin - Vitamin B12 - Lipid panel - Comprehensive metabolic panel - CBC with Differential/Platelet  2. Primary hypertension - Controlled. No change in medication  - CBC with Differential/Platelet; Future - Comprehensive metabolic panel; Future - Lipid panel; Future - Vitamin B12; Future - Ferritin; Future - TSH; Future - metoprolol succinate (TOPROL-XL) 25 MG 24 hr tablet; Take 1 tablet (25 mg total) by mouth daily.  Dispense: 90 tablet; Refill: 3 - Hemoglobin A1c; Future - Hemoglobin A1c - TSH - Ferritin - Vitamin B12 - Lipid panel - Comprehensive metabolic panel - CBC with Differential/Platelet - DME Wheelchair manual  3. Need for immunization against influenza  - Flu Vaccine QUAD 83mo+IM (Fluarix, Fluzone & Alfiuria Quad PF)  4. Sarcoidosis of lung ([place]) - Follow up with Rheumatology and Pulmonary as directed - DME Wheelchair manual  5. Acute on chronic diastolic  congestive heart failure ([place]) - Continue with Cardiology plan of care  6. Gastroesophageal reflux disease, unspecified whether esophagitis present - Continue PPI   7. Other specified hypothyroidism - Consider increase in synthroid  - CBC with Differential/Platelet; Future - Comprehensive metabolic panel; Future - Lipid panel; Future - Vitamin B12; Future - Ferritin; Future - TSH; Future - Hemoglobin A1c; Future - Hemoglobin A1c - TSH - Ferritin - Vitamin B12 - Lipid panel - Comprehensive metabolic panel - CBC with Differential/Platelet - Flu Vaccine QUAD 46mo+IM (Fluarix, Fluzone & Alfiuria Quad PF)   8. Morbid obesity ([place]) - Encouraged weight loss through lifestyle modifications  - CBC with Differential/Platelet; Future - Comprehensive metabolic panel; Future - Lipid panel; Future - Vitamin B12; Future - Ferritin; Future - TSH; Future - Hemoglobin A1c; Future - Hemoglobin A1c - TSH - Ferritin - Vitamin B12 - Lipid panel - Comprehensive metabolic panel - CBC with Differential/Platelet - Flu Vaccine QUAD 7mo+IM (Fluarix, Fluzone & Alfiuria Quad PF) - DME Wheelchair manual  9. OSA (obstructive sleep apnea) - Follow up  with pulmonary as directed - CBC with Differential/Platelet; Future - Comprehensive metabolic panel; Future - Lipid panel; Future - Vitamin B12; Future - Ferritin; Future - TSH; Future - Hemoglobin A1c; Future - Hemoglobin A1c - TSH - Ferritin - Vitamin B12 - Lipid panel - Comprehensive metabolic panel - CBC with Differential/Platelet - Flu Vaccine QUAD 23mo+IM (Fluarix, Fluzone & Alfiuria Quad PF) - DME Wheelchair manual  10. Need for prophylactic vaccination and inoculation against varicella -Advised patient on price of shingles vaccination in the office due to being on Medicare.  She was advised to get the shingles vaccination at pharmacy.  She refused stating "do not worry I will argue with the insurance company when the bill comes  through". - Varicella-zoster vaccine IM  [name], NP  Time spent on chart review, time with patient; discussion of hypertension,, obesity, OSA, sarcoidosis, CHF, home monitoring  treatment, follow up plan, and documentation 42 minutes

## 2021-12-14 NOTE — Patient Instructions (Signed)
It was great seeing you today   We will follow up with you regarding your lab work   I have sent in your medications that were due for a year and I will order you wheelchair

## 2021-12-15 LAB — CBC WITH DIFFERENTIAL/PLATELET
Basophils Absolute: 0.1 10*3/uL (ref 0.0–0.1)
Basophils Relative: 0.9 % (ref 0.0–3.0)
Eosinophils Absolute: 0 10*3/uL (ref 0.0–0.7)
Eosinophils Relative: 0 % (ref 0.0–5.0)
HCT: 40.5 % (ref 36.0–46.0)
Hemoglobin: 12.5 g/dL (ref 12.0–15.0)
Lymphocytes Relative: 2.3 % — ABNORMAL LOW (ref 12.0–46.0)
Lymphs Abs: 0.2 10*3/uL — ABNORMAL LOW (ref 0.7–4.0)
MCHC: 30.9 g/dL (ref 30.0–36.0)
MCV: 79.3 fl (ref 78.0–100.0)
Monocytes Absolute: 0.1 10*3/uL (ref 0.1–1.0)
Monocytes Relative: 1.6 % — ABNORMAL LOW (ref 3.0–12.0)
Neutro Abs: 8.9 10*3/uL — ABNORMAL HIGH (ref 1.4–7.7)
Neutrophils Relative %: 95.2 % — ABNORMAL HIGH (ref 43.0–77.0)
Platelets: 260 10*3/uL (ref 150.0–400.0)
RBC: 5.11 Mil/uL (ref 3.87–5.11)
RDW: 20.1 % — ABNORMAL HIGH (ref 11.5–15.5)
WBC: 9.3 10*3/uL (ref 4.0–10.5)

## 2021-12-15 LAB — LIPID PANEL
Cholesterol: 218 mg/dL — ABNORMAL HIGH (ref 0–200)
HDL: 68.5 mg/dL (ref >39.00)
LDL Cholesterol: 132 mg/dL — ABNORMAL HIGH (ref 0–99)
NonHDL: 149.84
Total CHOL/HDL Ratio: 3
Triglycerides: 88 mg/dL (ref 0.0–149.0)
VLDL: 17.6 mg/dL (ref 0.0–40.0)

## 2021-12-15 LAB — TSH: TSH: 1.97 u[IU]/mL (ref 0.35–5.50)

## 2021-12-15 LAB — COMPREHENSIVE METABOLIC PANEL
ALT: 14 U/L (ref 0–35)
AST: 16 U/L (ref 0–37)
Albumin: 4.6 g/dL (ref 3.5–5.2)
Alkaline Phosphatase: 46 U/L (ref 39–117)
BUN: 25 mg/dL — ABNORMAL HIGH (ref 6–23)
CO2: 26 mEq/L (ref 19–32)
Calcium: 9.6 mg/dL (ref 8.4–10.5)
Chloride: 91 mEq/L — ABNORMAL LOW (ref 96–112)
Creatinine, Ser: 1.53 mg/dL — ABNORMAL HIGH (ref 0.40–1.20)
GFR: 37.89 mL/min — ABNORMAL LOW (ref >60.00)
Glucose, Bld: 131 mg/dL — ABNORMAL HIGH (ref 70–99)
Potassium: 4.1 mEq/L (ref 3.5–5.1)
Sodium: 144 mEq/L (ref 135–145)
Total Bilirubin: 0.8 mg/dL (ref 0.2–1.2)
Total Protein: 9.4 g/dL — ABNORMAL HIGH (ref 6.0–8.3)

## 2021-12-15 LAB — VITAMIN B12: Vitamin B-12: 1150 pg/mL — ABNORMAL HIGH (ref 211–911)

## 2021-12-15 LAB — HEMOGLOBIN A1C: Hgb A1c MFr Bld: 6.1 % (ref 4.6–6.5)

## 2021-12-15 LAB — FERRITIN: Ferritin: 26.2 ng/mL (ref 10.0–291.0)

## 2021-12-17 DIAGNOSIS — J441 Chronic obstructive pulmonary disease with (acute) exacerbation: Secondary | ICD-10-CM | POA: Diagnosis not present

## 2021-12-17 DIAGNOSIS — R918 Other nonspecific abnormal finding of lung field: Secondary | ICD-10-CM | POA: Diagnosis not present

## 2021-12-17 DIAGNOSIS — M1712 Unilateral primary osteoarthritis, left knee: Secondary | ICD-10-CM | POA: Diagnosis not present

## 2021-12-17 DIAGNOSIS — I5030 Unspecified diastolic (congestive) heart failure: Secondary | ICD-10-CM | POA: Diagnosis not present

## 2021-12-17 DIAGNOSIS — D869 Sarcoidosis, unspecified: Secondary | ICD-10-CM | POA: Diagnosis not present

## 2021-12-18 ENCOUNTER — Other Ambulatory Visit: Payer: Self-pay | Admitting: Cardiology

## 2021-12-27 ENCOUNTER — Other Ambulatory Visit: Payer: Self-pay | Admitting: Adult Health

## 2021-12-29 ENCOUNTER — Telehealth: Payer: Self-pay

## 2021-12-29 NOTE — Telephone Encounter (Signed)
Confirmation fax received. Called pt and advised of update.  ?

## 2021-12-29 NOTE — Telephone Encounter (Signed)
Called pt to see if she received the wheelchair. Pt stated that she has not received the wheelchair. I advised pt that I will call and check the status.   ? ? ?Called [employer] ( wheel chair order) and they stated that I was given the wrong fax number. New fax number given [phone]. Faxed information again. Awaiting confirmation fax.  ? ?No further action needed at this time.  ?

## 2022-01-08 DIAGNOSIS — G4733 Obstructive sleep apnea (adult) (pediatric): Secondary | ICD-10-CM | POA: Diagnosis not present

## 2022-01-19 NOTE — Telephone Encounter (Signed)
Faxed ppw was sent over from [employer] stating that "after speaking to client, client wants a standard light weight manual wheelchair which we do not offer". I called pt to advised of note and she stated she doesn't need a wheelchair she needs a transport chair. She stated that [employer] advised that her insurance does not cover this chair. I advised pt if she could contact insurance company to see if this is covered and call us back.  ?

## 2022-01-24 DIAGNOSIS — G4733 Obstructive sleep apnea (adult) (pediatric): Secondary | ICD-10-CM | POA: Diagnosis not present

## 2022-02-08 DIAGNOSIS — G4733 Obstructive sleep apnea (adult) (pediatric): Secondary | ICD-10-CM | POA: Diagnosis not present

## 2022-03-10 DIAGNOSIS — G4733 Obstructive sleep apnea (adult) (pediatric): Secondary | ICD-10-CM | POA: Diagnosis not present

## 2022-03-30 DIAGNOSIS — M159 Polyosteoarthritis, unspecified: Secondary | ICD-10-CM | POA: Diagnosis not present

## 2022-03-30 DIAGNOSIS — M064 Inflammatory polyarthropathy: Secondary | ICD-10-CM | POA: Diagnosis not present

## 2022-03-30 DIAGNOSIS — M2550 Pain in unspecified joint: Secondary | ICD-10-CM | POA: Diagnosis not present

## 2022-03-30 DIAGNOSIS — D860 Sarcoidosis of lung: Secondary | ICD-10-CM | POA: Diagnosis not present

## 2022-03-30 DIAGNOSIS — Z79899 Other long term (current) drug therapy: Secondary | ICD-10-CM | POA: Diagnosis not present

## 2022-03-30 DIAGNOSIS — M25562 Pain in left knee: Secondary | ICD-10-CM | POA: Diagnosis not present

## 2022-03-30 DIAGNOSIS — M25561 Pain in right knee: Secondary | ICD-10-CM | POA: Diagnosis not present

## 2022-03-30 DIAGNOSIS — G894 Chronic pain syndrome: Secondary | ICD-10-CM | POA: Diagnosis not present

## 2022-04-10 DIAGNOSIS — G4733 Obstructive sleep apnea (adult) (pediatric): Secondary | ICD-10-CM | POA: Diagnosis not present

## 2022-04-25 DIAGNOSIS — G4733 Obstructive sleep apnea (adult) (pediatric): Secondary | ICD-10-CM | POA: Diagnosis not present

## 2022-05-10 DIAGNOSIS — G4733 Obstructive sleep apnea (adult) (pediatric): Secondary | ICD-10-CM | POA: Diagnosis not present

## 2022-06-02 DIAGNOSIS — G4733 Obstructive sleep apnea (adult) (pediatric): Secondary | ICD-10-CM | POA: Diagnosis not present

## 2022-06-10 DIAGNOSIS — G4733 Obstructive sleep apnea (adult) (pediatric): Secondary | ICD-10-CM | POA: Diagnosis not present

## 2022-06-29 DIAGNOSIS — M064 Inflammatory polyarthropathy: Secondary | ICD-10-CM | POA: Diagnosis not present

## 2022-06-29 DIAGNOSIS — D860 Sarcoidosis of lung: Secondary | ICD-10-CM | POA: Diagnosis not present

## 2022-06-29 DIAGNOSIS — Z79899 Other long term (current) drug therapy: Secondary | ICD-10-CM | POA: Diagnosis not present

## 2022-06-29 DIAGNOSIS — M1991 Primary osteoarthritis, unspecified site: Secondary | ICD-10-CM | POA: Diagnosis not present

## 2022-07-11 DIAGNOSIS — G4733 Obstructive sleep apnea (adult) (pediatric): Secondary | ICD-10-CM | POA: Diagnosis not present

## 2022-07-20 ENCOUNTER — Encounter: Payer: Self-pay | Admitting: Adult Health

## 2022-07-20 NOTE — Telephone Encounter (Signed)
Please advise 

## 2022-08-02 ENCOUNTER — Telehealth (INDEPENDENT_AMBULATORY_CARE_PROVIDER_SITE_OTHER): Payer: Medicare Other | Admitting: Adult Health

## 2022-08-02 ENCOUNTER — Encounter: Payer: Self-pay | Admitting: Adult Health

## 2022-08-02 VITALS — BP 139/82 | Temp 97.9°F

## 2022-08-02 DIAGNOSIS — M5432 Sciatica, left side: Secondary | ICD-10-CM | POA: Diagnosis not present

## 2022-08-02 DIAGNOSIS — R110 Nausea: Secondary | ICD-10-CM | POA: Diagnosis not present

## 2022-08-02 MED ORDER — ONDANSETRON HCL 4 MG PO TABS: 4.0000 mg | ORAL_TABLET | Freq: Three times a day (TID) | ORAL | 0 refills | Status: DC | PRN

## 2022-08-02 MED ORDER — CYCLOBENZAPRINE HCL 10 MG PO TABS: 10.0000 mg | ORAL_TABLET | Freq: Three times a day (TID) | ORAL | 0 refills | Status: DC | PRN

## 2022-08-02 NOTE — Progress Notes (Signed)
Virtual Visit via Video Note  I connected with [facility] on 08/02/22 at 10:30 AM EDT by a video enabled telemedicine application and verified that I am speaking with the correct person using two identifiers.  Location patient: home Location provider:work or home office Persons participating in the virtual visit: patient, provider  I discussed the limitations of evaluation and management by telemedicine and the availability of in person appointments. The patient expressed understanding and agreed to proceed.   HPI: 56 year old female who is being evaluated today for an acute issue that has been present for roughly 1 to 2 months.  She reports a burning and numbness sensation that has been radiating down her left leg.  The discomfort is only present when she is laying in bed.  We did have her try placing a pillow under her back and she reports that the numbness and tingling went away and the burning sensation improved but that the pillow was uncomfortable so she stopped placing it under her low back when sleeping.  Her symptoms have returned.  She is taking prednisone 10 mg daily   She also needs a refill of Zofran she takes very infrequently for nausea   ROS: See pertinent positives and negatives per HPI.  Past Medical History:  Diagnosis Date   Anemia    Arthritis    hands, shoulders, no meds   CHF (congestive heart failure) ([facility])    EF60-65%   Chronic hyperventilation syndrome    w/ obesity tx with albuterol inhaler and oxygen 2L   COPD (chronic obstructive pulmonary disease) ([facility])    uses oxygen 2 L   Gallstones    GERD (gastroesophageal reflux disease)    diet controlled - no meds   H/O hiatal hernia    Hypertension    Hypothyroidism    Kidney stones    Morbid obesity ([place])    Pneumonia    hoispitalized in 08/2011   Sarcoidosis    Seasonal allergies    Sleep apnea    uses CPAP machine     Past Surgical History:  Procedure Laterality Date   CESAREAN SECTION   1994   x 1   CHOLECYSTECTOMY  2000   DILATION AND CURETTAGE OF UTERUS N/A 08/09/2016   Procedure: DILATATION AND CURETTAGE;  Surgeon: Everitt Amber, MD;  Location: [facility];  Service: Gynecology;  Laterality: N/A;   HYSTEROSCOPY WITH D & C  12/27/2011   Procedure: DILATATION AND CURETTAGE /HYSTEROSCOPY;  Surgeon: Maeola Sarah. Landry Mellow, MD;  Location: [facility];  Service: Gynecology;;   I and D of abcess  05/2011   INTRAUTERINE DEVICE (IUD) INSERTION N/A 08/09/2016   Procedure: INTRAUTERINE DEVICE (IUD) INSERTION;  Surgeon: Everitt Amber, MD;  [facility]: [facility];  Service: Gynecology;  Laterality: N/A;   LUNG BIOPSY     uterine abletion      Family History  Problem Relation Age of Onset   Diabetes Father    Cancer Father 104       colon cancer    Diabetes Brother    Deep vein thrombosis Mother    Aneurysm Sister        d/o brain aneurysm       Current Outpatient Medications:    acetaminophen (TYLENOL) 500 MG tablet, Take 1 tablet (500 mg total) by mouth every 6 (six) hours as needed., Disp: 30 tablet, Rfl: 0   albuterol (VENTOLIN HFA) 108 (90 Base) MCG/ACT inhaler, INHALE 1-2 PUFFS BY MOUTH EVERY 6 HOURS AS NEEDED FOR WHEEZE OR  SHORTNESS OF BREATH, Disp: 8.5 each, Rfl: 3   atorvastatin (LIPITOR) 40 MG tablet, Take 1 tablet (40 mg total) by mouth daily at 6 PM., Disp: 90 tablet, Rfl: 3   B-D TB SYRINGE 1CC/27GX1/2" 27G X 1/2" 1 ML MISC, USE ONCE A WEEK TO INJECT METHOTREXATE, Disp: , Rfl:    calcium citrate-vitamin D (CITRACAL+D) 315-200 MG-UNIT tablet, Take 1 tablet by mouth daily., Disp: , Rfl:    cetirizine (ZYRTEC) 10 MG tablet, Take 10 mg by mouth at bedtime as needed for allergies., Disp: , Rfl:    famotidine (PEPCID) 40 MG tablet, TAKE 1 TABLET BY MOUTH TWICE A DAY, Disp: 60 tablet, Rfl: 0   folic acid (FOLVITE) 1 MG tablet, Take 1 mg by mouth daily., Disp: , Rfl: 3   HYDROcodone-acetaminophen (NORCO) 10-325 MG tablet, 1 tablet as needed, Disp: , Rfl:    losartan (COZAAR) 100 MG tablet, TAKE 1  TABLET (100 MG TOTAL) DAILY BY MOUTH., Disp: 90 tablet, Rfl: 3   methotrexate 50 MG/2ML injection, Inject 20 mg into the vein once a week. Dose is 0.8 mL ('20mg'$ ). Take on Fridays, Disp: , Rfl:    metoprolol succinate (TOPROL-XL) 25 MG 24 hr tablet, Take 1 tablet (25 mg total) by mouth daily., Disp: 90 tablet, Rfl: 3   NON FORMULARY, 2 liter of oxygen, Disp: , Rfl:    ondansetron (ZOFRAN ODT) 8 MG disintegrating tablet, Take 1 tablet (8 mg total) by mouth every 8 (eight) hours as needed for nausea., Disp: 20 tablet, Rfl: 0   predniSONE (DELTASONE) 10 MG tablet, Take 1 tablet (10 mg total) by mouth daily., Disp: 10 tablet, Rfl: 0   torsemide (DEMADEX) 20 MG tablet, Take 1 tablet (20 mg total) by mouth daily. Take two tablets ( 40 mg) for increase wt gain of 3 lbs in one day or 5 lbs in one week. Please make overdue appt with Dr. [name] before anymore refills. Thank you 2nd attempt, Disp: 22 tablet, Rfl: 0   triamcinolone ointment (KENALOG) 0.1 %, Apply 1 application topically 2 (two) times daily. For Sarcoidosis, Disp: , Rfl:    vitamin B-12 1000 MCG tablet, Take 1 tablet (1,000 mcg total) by mouth daily., Disp: 30 tablet, Rfl: 0  EXAM:  VITALS per patient if applicable:  GENERAL: alert, oriented, appears well and in no acute distress  HEENT: atraumatic, conjunttiva clear, no obvious abnormalities on inspection of external nose and ears  NECK: normal movements of the head and neck  LUNGS: on inspection no signs of respiratory distress, breathing rate appears normal, no obvious gross SOB, gasping or wheezing  CV: no obvious cyanosis  MS: moves all visible extremities without noticeable abnormality  PSYCH/NEURO: pleasant and cooperative, no obvious depression or anxiety, speech and thought processing grossly intact  ASSESSMENT AND PLAN:  Discussed the following assessment and plan:  1. Sciatica of left side  - cyclobenzaprine (FLEXERIL) 10 MG tablet; Take 1 tablet (10 mg total) by mouth  3 (three) times daily as needed for muscle spasms.  Dispense: 30 tablet; Refill: 0  2. Nausea  - ondansetron (ZOFRAN) 4 MG tablet; Take 1 tablet (4 mg total) by mouth every 8 (eight) hours as needed for nausea or vomiting.  Dispense: 20 tablet; Refill: 0  [name], NP     I discussed the assessment and treatment plan with the patient. The patient was provided an opportunity to ask questions and all were answered. The patient agreed with the plan and demonstrated an understanding  of the instructions.   The patient was advised to call back or seek an in-person evaluation if the symptoms worsen or if the condition fails to improve as anticipated.   [name], NP

## 2022-08-10 DIAGNOSIS — G4733 Obstructive sleep apnea (adult) (pediatric): Secondary | ICD-10-CM | POA: Diagnosis not present

## 2022-08-18 ENCOUNTER — Other Ambulatory Visit: Payer: Self-pay | Admitting: Adult Health

## 2022-08-24 ENCOUNTER — Other Ambulatory Visit (HOSPITAL_BASED_OUTPATIENT_CLINIC_OR_DEPARTMENT_OTHER): Payer: Self-pay

## 2022-08-24 ENCOUNTER — Encounter: Payer: Self-pay | Admitting: Hematology

## 2022-08-24 MED ORDER — HYDROCODONE-ACETAMINOPHEN 10-325 MG PO TABS
1.0000 | ORAL_TABLET | Freq: Four times a day (QID) | ORAL | 0 refills | Status: DC | PRN
  Filled 2022-08-24: qty 120, 30d supply, fill #0

## 2022-08-25 ENCOUNTER — Other Ambulatory Visit (HOSPITAL_BASED_OUTPATIENT_CLINIC_OR_DEPARTMENT_OTHER): Payer: Self-pay

## 2022-09-04 DIAGNOSIS — G4733 Obstructive sleep apnea (adult) (pediatric): Secondary | ICD-10-CM | POA: Diagnosis not present

## 2022-09-10 DIAGNOSIS — G4733 Obstructive sleep apnea (adult) (pediatric): Secondary | ICD-10-CM | POA: Diagnosis not present

## 2022-10-10 DIAGNOSIS — G4733 Obstructive sleep apnea (adult) (pediatric): Secondary | ICD-10-CM | POA: Diagnosis not present

## 2022-10-14 ENCOUNTER — Encounter: Payer: Self-pay | Admitting: Adult Health

## 2022-10-14 NOTE — Telephone Encounter (Signed)
Patient requesting that another provider review the message and pics she submitted to mychart this morning in Cory's absence.

## 2022-10-17 ENCOUNTER — Encounter: Payer: Self-pay | Admitting: Adult Health

## 2022-10-17 ENCOUNTER — Telehealth: Payer: Medicare Other | Admitting: Internal Medicine

## 2022-10-17 NOTE — Telephone Encounter (Addendum)
Requested pt to come in office. Pt states she only feels comfortable with her son transporting her b/c he's the only one that can pick her up if she falls. Offered to rescheduled however pt states she does not know when her son will be able to bring her b/c he's a truck driver. Offered transportation through insurance however, pt declines, stating on her son can bring her; that the driver may be a female & she is unable to step up in the [place]. Pt again advised that provider would prefer in person appt to assess her & that she should call as soon as her son is available to schedule appt.

## 2022-10-19 NOTE — Telephone Encounter (Signed)
Called pt to apologize that I read her message wrong. Pt just wanted to make sure it was ok to travel to see family at a funeral. I advised the pt the transmission period with someone who is positive for RSV per CDC guidelines. Also, advised pt that this is a decision that she would have to make. Pt verbalized understanding.

## 2022-10-26 ENCOUNTER — Encounter (HOSPITAL_COMMUNITY): Payer: Self-pay | Admitting: Emergency Medicine

## 2022-10-26 ENCOUNTER — Other Ambulatory Visit: Payer: Self-pay

## 2022-10-26 ENCOUNTER — Emergency Department (HOSPITAL_COMMUNITY): Payer: Medicare Other

## 2022-10-26 ENCOUNTER — Inpatient Hospital Stay (HOSPITAL_COMMUNITY)
Admission: EM | Admit: 2022-10-26 | Discharge: 2022-11-01 | DRG: 193 | Disposition: A | Discharge status: Home / Self Care | Payer: Medicare Other | Attending: Internal Medicine | Admitting: Internal Medicine

## 2022-10-26 ENCOUNTER — Ambulatory Visit: Payer: Medicare Other | Admitting: Family Medicine

## 2022-10-26 DIAGNOSIS — Z87442 Personal history of urinary calculi: Secondary | ICD-10-CM

## 2022-10-26 DIAGNOSIS — Z743 Need for continuous supervision: Secondary | ICD-10-CM | POA: Diagnosis not present

## 2022-10-26 DIAGNOSIS — M19012 Primary osteoarthritis, left shoulder: Secondary | ICD-10-CM | POA: Diagnosis not present

## 2022-10-26 DIAGNOSIS — Z800 Family history of malignant neoplasm of digestive organs: Secondary | ICD-10-CM

## 2022-10-26 DIAGNOSIS — G8929 Other chronic pain: Secondary | ICD-10-CM | POA: Diagnosis not present

## 2022-10-26 DIAGNOSIS — M19042 Primary osteoarthritis, left hand: Secondary | ICD-10-CM | POA: Diagnosis present

## 2022-10-26 DIAGNOSIS — J449 Chronic obstructive pulmonary disease, unspecified: Secondary | ICD-10-CM | POA: Diagnosis not present

## 2022-10-26 DIAGNOSIS — E876 Hypokalemia: Secondary | ICD-10-CM | POA: Diagnosis not present

## 2022-10-26 DIAGNOSIS — J111 Influenza due to unidentified influenza virus with other respiratory manifestations: Principal | ICD-10-CM

## 2022-10-26 DIAGNOSIS — I5030 Unspecified diastolic (congestive) heart failure: Secondary | ICD-10-CM | POA: Diagnosis present

## 2022-10-26 DIAGNOSIS — E662 Morbid (severe) obesity with alveolar hypoventilation: Secondary | ICD-10-CM | POA: Diagnosis present

## 2022-10-26 DIAGNOSIS — E039 Hypothyroidism, unspecified: Secondary | ICD-10-CM | POA: Diagnosis not present

## 2022-10-26 DIAGNOSIS — E785 Hyperlipidemia, unspecified: Secondary | ICD-10-CM | POA: Diagnosis not present

## 2022-10-26 DIAGNOSIS — I130 Hypertensive heart and chronic kidney disease with heart failure and stage 1 through stage 4 chronic kidney disease, or unspecified chronic kidney disease: Secondary | ICD-10-CM | POA: Diagnosis present

## 2022-10-26 DIAGNOSIS — G4733 Obstructive sleep apnea (adult) (pediatric): Secondary | ICD-10-CM | POA: Diagnosis present

## 2022-10-26 DIAGNOSIS — N1831 Chronic kidney disease, stage 3a: Secondary | ICD-10-CM | POA: Diagnosis present

## 2022-10-26 DIAGNOSIS — Z9981 Dependence on supplemental oxygen: Secondary | ICD-10-CM

## 2022-10-26 DIAGNOSIS — Z6841 Body Mass Index (BMI) 40.0 and over, adult: Secondary | ICD-10-CM

## 2022-10-26 DIAGNOSIS — I499 Cardiac arrhythmia, unspecified: Secondary | ICD-10-CM | POA: Diagnosis not present

## 2022-10-26 DIAGNOSIS — G4489 Other headache syndrome: Secondary | ICD-10-CM | POA: Diagnosis not present

## 2022-10-26 DIAGNOSIS — K219 Gastro-esophageal reflux disease without esophagitis: Secondary | ICD-10-CM | POA: Diagnosis not present

## 2022-10-26 DIAGNOSIS — Z1152 Encounter for screening for COVID-19: Secondary | ICD-10-CM | POA: Diagnosis not present

## 2022-10-26 DIAGNOSIS — T501X6A Underdosing of loop [high-ceiling] diuretics, initial encounter: Secondary | ICD-10-CM | POA: Diagnosis present

## 2022-10-26 DIAGNOSIS — J9621 Acute and chronic respiratory failure with hypoxia: Secondary | ICD-10-CM | POA: Diagnosis present

## 2022-10-26 DIAGNOSIS — I5032 Chronic diastolic (congestive) heart failure: Secondary | ICD-10-CM | POA: Diagnosis present

## 2022-10-26 DIAGNOSIS — Z91148 Patient's other noncompliance with medication regimen for other reason: Secondary | ICD-10-CM

## 2022-10-26 DIAGNOSIS — Z833 Family history of diabetes mellitus: Secondary | ICD-10-CM

## 2022-10-26 DIAGNOSIS — R0602 Shortness of breath: Secondary | ICD-10-CM | POA: Diagnosis not present

## 2022-10-26 DIAGNOSIS — J101 Influenza due to other identified influenza virus with other respiratory manifestations: Principal | ICD-10-CM | POA: Diagnosis present

## 2022-10-26 DIAGNOSIS — Z79899 Other long term (current) drug therapy: Secondary | ICD-10-CM

## 2022-10-26 DIAGNOSIS — R079 Chest pain, unspecified: Secondary | ICD-10-CM | POA: Diagnosis not present

## 2022-10-26 DIAGNOSIS — M19011 Primary osteoarthritis, right shoulder: Secondary | ICD-10-CM | POA: Diagnosis present

## 2022-10-26 DIAGNOSIS — D860 Sarcoidosis of lung: Secondary | ICD-10-CM | POA: Diagnosis present

## 2022-10-26 DIAGNOSIS — Z9049 Acquired absence of other specified parts of digestive tract: Secondary | ICD-10-CM

## 2022-10-26 DIAGNOSIS — I10 Essential (primary) hypertension: Secondary | ICD-10-CM | POA: Diagnosis present

## 2022-10-26 DIAGNOSIS — Z888 Allergy status to other drugs, medicaments and biological substances status: Secondary | ICD-10-CM

## 2022-10-26 DIAGNOSIS — Z7952 Long term (current) use of systemic steroids: Secondary | ICD-10-CM

## 2022-10-26 DIAGNOSIS — M19041 Primary osteoarthritis, right hand: Secondary | ICD-10-CM | POA: Diagnosis present

## 2022-10-26 DIAGNOSIS — R110 Nausea: Secondary | ICD-10-CM | POA: Diagnosis not present

## 2022-10-26 DIAGNOSIS — R6889 Other general symptoms and signs: Secondary | ICD-10-CM | POA: Diagnosis not present

## 2022-10-26 LAB — CBC
HCT: 38.3 % (ref 36.0–46.0)
Hemoglobin: 11 g/dL — ABNORMAL LOW (ref 12.0–15.0)
MCH: 22.6 pg — ABNORMAL LOW (ref 26.0–34.0)
MCHC: 28.7 g/dL — ABNORMAL LOW (ref 30.0–36.0)
MCV: 78.8 fL — ABNORMAL LOW (ref 80.0–100.0)
Platelets: 200 10*3/uL (ref 150–400)
RBC: 4.86 MIL/uL (ref 3.87–5.11)
RDW: 22.5 % — ABNORMAL HIGH (ref 11.5–15.5)
WBC: 5.4 10*3/uL (ref 4.0–10.5)
nRBC: 0 % (ref 0.0–0.2)

## 2022-10-26 LAB — COMPREHENSIVE METABOLIC PANEL
ALT: 11 U/L (ref 0–44)
AST: 17 U/L (ref 15–41)
Albumin: 3.7 g/dL (ref 3.5–5.0)
Alkaline Phosphatase: 47 U/L (ref 38–126)
Anion gap: 11 (ref 5–15)
BUN: 12 mg/dL (ref 6–20)
CO2: 30 mmol/L (ref 22–32)
Calcium: 8.6 mg/dL — ABNORMAL LOW (ref 8.9–10.3)
Chloride: 96 mmol/L — ABNORMAL LOW (ref 98–111)
Creatinine, Ser: 1.12 mg/dL — ABNORMAL HIGH (ref 0.44–1.00)
GFR, Estimated: 57 mL/min — ABNORMAL LOW (ref >60)
Glucose, Bld: 117 mg/dL — ABNORMAL HIGH (ref 70–99)
Potassium: 3.6 mmol/L (ref 3.5–5.1)
Sodium: 137 mmol/L (ref 135–145)
Total Bilirubin: 0.7 mg/dL (ref 0.3–1.2)
Total Protein: 7.6 g/dL (ref 6.5–8.1)

## 2022-10-26 LAB — I-STAT VENOUS BLOOD GAS, ED
Acid-Base Excess: 4 mmol/L — ABNORMAL HIGH (ref 0.0–2.0)
Bicarbonate: 31.9 mmol/L — ABNORMAL HIGH (ref 20.0–28.0)
Calcium, Ion: 1.11 mmol/L — ABNORMAL LOW (ref 1.15–1.40)
HCT: 38 % (ref 36.0–46.0)
Hemoglobin: 12.9 g/dL (ref 12.0–15.0)
O2 Saturation: 86 %
Potassium: 3.7 mmol/L (ref 3.5–5.1)
Sodium: 139 mmol/L (ref 135–145)
TCO2: 34 mmol/L — ABNORMAL HIGH (ref 22–32)
pCO2, Ven: 60.2 mmHg — ABNORMAL HIGH (ref 44–60)
pH, Ven: 7.333 (ref 7.25–7.43)
pO2, Ven: 57 mmHg — ABNORMAL HIGH (ref 32–45)

## 2022-10-26 LAB — RESP PANEL BY RT-PCR (RSV, FLU A&B, COVID)  RVPGX2
Influenza A by PCR: POSITIVE — AB
Influenza B by PCR: NEGATIVE
Resp Syncytial Virus by PCR: NEGATIVE
SARS Coronavirus 2 by RT PCR: NEGATIVE

## 2022-10-26 LAB — I-STAT BETA HCG BLOOD, ED (MC, WL, AP ONLY): I-stat hCG, quantitative: <5 m[IU]/mL (ref <5)

## 2022-10-26 LAB — BRAIN NATRIURETIC PEPTIDE: B Natriuretic Peptide: 72.8 pg/mL (ref 0.0–100.0)

## 2022-10-26 MED ORDER — CYCLOBENZAPRINE HCL 10 MG PO TABS: 10.0000 mg | ORAL_TABLET | Freq: Three times a day (TID) | ORAL | Status: DC | PRN

## 2022-10-26 MED ORDER — DOCUSATE SODIUM 100 MG PO CAPS
100.0000 mg | ORAL_CAPSULE | Freq: Two times a day (BID) | ORAL | Status: DC
  Administered 2022-10-26 – 2022-10-31 (×5): 100 mg via ORAL
  Filled 2022-10-26 (×12): qty 1

## 2022-10-26 MED ORDER — ACETAMINOPHEN 500 MG PO TABS
1000.0000 mg | ORAL_TABLET | Freq: Once | ORAL | Status: AC
  Administered 2022-10-26: 1000 mg via ORAL
  Filled 2022-10-26: qty 2

## 2022-10-26 MED ORDER — ENOXAPARIN SODIUM 40 MG/0.4ML IJ SOSY
40.0000 mg | PREFILLED_SYRINGE | INTRAMUSCULAR | Status: DC
  Administered 2022-10-26 – 2022-10-30 (×5): 40 mg via SUBCUTANEOUS
  Filled 2022-10-26 (×5): qty 0.4

## 2022-10-26 MED ORDER — METOPROLOL SUCCINATE ER 25 MG PO TB24
25.0000 mg | ORAL_TABLET | Freq: Every day | ORAL | Status: DC
  Administered 2022-10-26 – 2022-11-01 (×7): 25 mg via ORAL
  Filled 2022-10-26 (×6): qty 1

## 2022-10-26 MED ORDER — HYDRALAZINE HCL 20 MG/ML IJ SOLN: 5.0000 mg | INTRAMUSCULAR | Status: DC | PRN

## 2022-10-26 MED ORDER — FUROSEMIDE 10 MG/ML IJ SOLN
60.0000 mg | Freq: Once | INTRAMUSCULAR | Status: AC
  Administered 2022-10-26: 60 mg via INTRAVENOUS
  Filled 2022-10-26: qty 6

## 2022-10-26 MED ORDER — GUAIFENESIN ER 600 MG PO TB12: 600.0000 mg | ORAL_TABLET | Freq: Two times a day (BID) | ORAL | Status: DC | PRN

## 2022-10-26 MED ORDER — POLYETHYLENE GLYCOL 3350 17 G PO PACK: 17.0000 g | PACK | Freq: Every day | ORAL | Status: DC | PRN

## 2022-10-26 MED ORDER — FOLIC ACID 1 MG PO TABS
1.0000 mg | ORAL_TABLET | Freq: Every day | ORAL | Status: DC
  Administered 2022-10-26 – 2022-11-01 (×7): 1 mg via ORAL
  Filled 2022-10-26 (×7): qty 1

## 2022-10-26 MED ORDER — OXYCODONE HCL 5 MG PO TABS
5.0000 mg | ORAL_TABLET | ORAL | Status: DC | PRN
  Administered 2022-10-26 – 2022-10-31 (×4): 5 mg via ORAL
  Filled 2022-10-26 (×4): qty 1

## 2022-10-26 MED ORDER — ACETAMINOPHEN 325 MG PO TABS
650.0000 mg | ORAL_TABLET | Freq: Four times a day (QID) | ORAL | Status: DC | PRN
  Administered 2022-10-26 (×2): 650 mg via ORAL
  Filled 2022-10-26 (×2): qty 2

## 2022-10-26 MED ORDER — PREDNISONE 10 MG PO TABS
10.0000 mg | ORAL_TABLET | Freq: Every day | ORAL | Status: DC
  Administered 2022-10-26 – 2022-11-01 (×7): 10 mg via ORAL
  Filled 2022-10-26 (×7): qty 1

## 2022-10-26 MED ORDER — BISACODYL 5 MG PO TBEC: 5.0000 mg | DELAYED_RELEASE_TABLET | Freq: Every day | ORAL | Status: DC | PRN

## 2022-10-26 MED ORDER — ACETAMINOPHEN 650 MG RE SUPP: 650.0000 mg | Freq: Four times a day (QID) | RECTAL | Status: DC | PRN

## 2022-10-26 MED ORDER — ONDANSETRON HCL 4 MG/2ML IJ SOLN: 4.0000 mg | Freq: Four times a day (QID) | INTRAMUSCULAR | Status: DC | PRN

## 2022-10-26 MED ORDER — OSELTAMIVIR PHOSPHATE 75 MG PO CAPS
75.0000 mg | ORAL_CAPSULE | Freq: Two times a day (BID) | ORAL | Status: AC
  Administered 2022-10-26 – 2022-10-30 (×10): 75 mg via ORAL
  Filled 2022-10-26 (×10): qty 1

## 2022-10-26 MED ORDER — ATORVASTATIN CALCIUM 40 MG PO TABS
40.0000 mg | ORAL_TABLET | Freq: Every day | ORAL | Status: DC
  Administered 2022-10-26 – 2022-10-31 (×6): 40 mg via ORAL
  Filled 2022-10-26 (×6): qty 1

## 2022-10-26 MED ORDER — FAMOTIDINE 20 MG PO TABS
40.0000 mg | ORAL_TABLET | Freq: Two times a day (BID) | ORAL | Status: DC
  Administered 2022-10-26 – 2022-11-01 (×13): 40 mg via ORAL
  Filled 2022-10-26 (×13): qty 2

## 2022-10-26 MED ORDER — ALBUTEROL SULFATE (2.5 MG/3ML) 0.083% IN NEBU: 2.5000 mg | INHALATION_SOLUTION | RESPIRATORY_TRACT | Status: DC | PRN

## 2022-10-26 MED ORDER — SODIUM CHLORIDE 0.9% FLUSH
3.0000 mL | Freq: Two times a day (BID) | INTRAVENOUS | Status: DC
  Administered 2022-10-26 – 2022-10-31 (×12): 3 mL via INTRAVENOUS

## 2022-10-26 MED ORDER — HYDROCODONE-ACETAMINOPHEN 10-325 MG PO TABS: 1.0000 | ORAL_TABLET | Freq: Three times a day (TID) | ORAL | Status: DC | PRN

## 2022-10-26 MED ORDER — ONDANSETRON HCL 4 MG PO TABS
4.0000 mg | ORAL_TABLET | Freq: Four times a day (QID) | ORAL | Status: DC | PRN
  Administered 2022-10-31: 4 mg via ORAL
  Filled 2022-10-26: qty 1

## 2022-10-26 NOTE — ED Provider Notes (Signed)
[facility] EMERGENCY DEPARTMENT Provider Note   CSN: [id number] Arrival date & time: 10/26/22  1245     History  Chief Complaint  Patient presents with   Shortness of Breath    [name] is a 56 y.o. female.  The history is provided by the patient and medical records.  Shortness of Breath [name] is a 56 y.o. female who presents to the Emergency Department complaining of shortness of breath.  She Zentz to the emergency department by EMS for evaluation of shortness of breath.  She states that she started feeling poorly yesterday when she awoke.  She reports body aches, shortness of breath, cough productive of yellow sputum.  No reports of fevers at home.  She also reports that over the holidays she has not been taking her torsemide as often as she should.  She feels like she is retaining fluid.  She is on 2 L nasal cannula oxygen at baseline.  Her son also woke up feeling unwell and he was diagnosed with COVID and flu.  History of hypertension, sarcoidosis, COPD, OSA, RA, CHF.  She is on 2 L nasal cannula at baseline.     Home Medications Prior to Admission medications   Medication Sig Start Date End Date Taking? Authorizing Provider  acetaminophen (TYLENOL) 500 MG tablet Take 1 tablet (500 mg total) by mouth every 6 (six) hours as needed. 08/03/21   Varney Biles, MD  albuterol (VENTOLIN HFA) 108 (90 Base) MCG/ACT inhaler INHALE 1-2 PUFFS BY MOUTH EVERY 6 HOURS AS NEEDED FOR WHEEZE OR SHORTNESS OF BREATH 08/18/22   Nafziger, Tommi Rumps, NP  atorvastatin (LIPITOR) 40 MG tablet Take 1 tablet (40 mg total) by mouth daily at 6 PM. 12/14/21   Nafziger, Tommi Rumps, NP  B-D TB SYRINGE 1CC/27GX1/2" 27G X 1/2" 1 ML MISC USE ONCE A WEEK TO INJECT METHOTREXATE 11/05/19   [provider]  calcium citrate-vitamin D (CITRACAL+D) 315-200 MG-UNIT tablet Take 1 tablet by mouth daily.    [provider]  cetirizine (ZYRTEC) 10 MG tablet Take 10 mg by  mouth at bedtime as needed for allergies.    [provider]  cyclobenzaprine (FLEXERIL) 10 MG tablet Take 1 tablet (10 mg total) by mouth 3 (three) times daily as needed for muscle spasms. 08/02/22   Nafziger, Tommi Rumps, NP  famotidine (PEPCID) 40 MG tablet TAKE 1 TABLET BY MOUTH TWICE A DAY 08/11/21   Nafziger, Tommi Rumps, NP  folic acid (FOLVITE) 1 MG tablet Take 1 mg by mouth daily. 01/11/16   [provider]  HYDROcodone-acetaminophen (NORCO) 10-325 MG tablet 1 tablet as needed 09/27/21   [provider]  HYDROcodone-acetaminophen (NORCO) 10-325 MG tablet Take 1 tablet by mouth every 6 hours as needed 08/24/22     losartan (COZAAR) 100 MG tablet TAKE 1 TABLET (100 MG TOTAL) DAILY BY MOUTH. 07/20/21   Nafziger, Tommi Rumps, NP  methotrexate 50 MG/2ML injection Inject 20 mg into the vein once a week. Dose is 0.8 mL ('20mg'$ ). Take on Fridays    [provider]  metoprolol succinate (TOPROL-XL) 25 MG 24 hr tablet Take 1 tablet (25 mg total) by mouth daily. 12/14/21   Nafziger, Tommi Rumps, NP  NON FORMULARY 2 liter of oxygen    [provider]  ondansetron (ZOFRAN ODT) 8 MG disintegrating tablet Take 1 tablet (8 mg total) by mouth every 8 (eight) hours as needed for nausea. 08/03/21   Varney Biles, MD  ondansetron (ZOFRAN) 4 MG tablet Take 1  tablet (4 mg total) by mouth every 8 (eight) hours as needed for nausea or vomiting. 08/02/22   Nafziger, Tommi Rumps, NP  predniSONE (DELTASONE) 10 MG tablet Take 1 tablet (10 mg total) by mouth daily. 01/17/18   Florencia Reasons, MD  torsemide (DEMADEX) 20 MG tablet Take 1 tablet (20 mg total) by mouth daily. Take two tablets ( 40 mg) for increase wt gain of 3 lbs in one day or 5 lbs in one week. Please make overdue appt with Dr. [name] before anymore refills. Thank you 2nd attempt 12/20/21   Jerline Pain, MD  triamcinolone ointment (KENALOG) 0.1 % Apply 1 application topically 2 (two) times daily. For Sarcoidosis    [provider]  vitamin B-12 1000  MCG tablet Take 1 tablet (1,000 mcg total) by mouth daily. 06/19/16   Lavina Hamman, MD      Allergies    Tape    Review of Systems   Review of Systems  Respiratory:  Positive for shortness of breath.   All other systems reviewed and are negative.   Physical Exam Updated Vital Signs BP 133/81   Pulse (!) 108   Temp (!) 102.6 F (39.2 C)   Resp (!) 26   Wt (!) 218 kg   SpO2 100%   BMI 77.57 kg/m  Physical Exam Vitals and nursing note reviewed.  Constitutional:      Appearance: She is well-developed.  HENT:     Head: Normocephalic and atraumatic.  Cardiovascular:     Rate and Rhythm: Regular rhythm. Tachycardia present.     Heart sounds: No murmur heard. Pulmonary:     Effort: Pulmonary effort is normal. No respiratory distress.     Breath sounds: Normal breath sounds.  Abdominal:     Palpations: Abdomen is soft.     Tenderness: There is no abdominal tenderness. There is no guarding or rebound.  Musculoskeletal:        General: No tenderness.     Comments: Nonpitting edema to bilateral lower extremities  Skin:    General: Skin is warm and dry.  Neurological:     Mental Status: She is alert and oriented to person, place, and time.  Psychiatric:        Behavior: Behavior normal.     ED Results / Procedures / Treatments   Labs (all labs ordered are listed, but only abnormal results are displayed) Labs Reviewed  RESP PANEL BY RT-PCR (RSV, FLU A&B, COVID)  RVPGX2 - Abnormal; Notable for the following components:      Result Value   Influenza A by PCR POSITIVE (*)    All other components within normal limits  CBC - Abnormal; Notable for the following components:   Hemoglobin 11.0 (*)    MCV 78.8 (*)    MCH 22.6 (*)    MCHC 28.7 (*)    RDW 22.5 (*)    All other components within normal limits  COMPREHENSIVE METABOLIC PANEL - Abnormal; Notable for the following components:   Chloride 96 (*)    Glucose, Bld 117 (*)    Creatinine, Ser 1.12 (*)    Calcium 8.6  (*)    GFR, Estimated 57 (*)    All other components within normal limits  I-STAT VENOUS BLOOD GAS, ED - Abnormal; Notable for the following components:   pCO2, Ven 60.2 (*)    pO2, Ven 57 (*)    Bicarbonate 31.9 (*)    TCO2 34 (*)    Acid-Base Excess  4.0 (*)    Calcium, Ion 1.11 (*)    All other components within normal limits  BRAIN NATRIURETIC PEPTIDE  I-STAT BETA HCG BLOOD, ED ([facility], [facility], [facility] ONLY)    EKG None  Radiology DG Chest Port 1 View  Result Date: 10/26/2022 CLINICAL DATA:  [id number] with sharp chest pain. Shortness of breath also prep EXAM: PORTABLE CHEST 1 VIEW COMPARISON:  Portable chest 08/03/2021 FINDINGS: There is mild cardiomegaly. Central vessels are prominent but less prominent than previously. No interstitial edema seen or pleural effusions. The lungs are clear. The mediastinum is normally outlined. Thoracic spondylosis and degenerative disc disease. There are multiple overlying monitor wires. IMPRESSION: No acute chest findings. Mild cardiomegaly and central vascular prominence but less prominent than previously. No edema or focal airspace disease. Electronically Signed   By: Telford Nab M.D.   On: 10/26/2022 06:46    Procedures Procedures    Medications Ordered in ED Medications  acetaminophen (TYLENOL) tablet 1,000 mg (1,000 mg Oral Given 10/26/22 0629)  furosemide (LASIX) injection 60 mg (60 mg Intravenous Given 10/26/22 0600)    ED Course/ Medical Decision Making/ A&P Clinical Course as of 10/26/22 0738  Wed Oct 26, 2022  0706 Received sign out from Dr. Ralene Bathe. Pending labs. Presenting with increased shortness of breath, stopped taking torsemide recently and febrile. Family member has COVID [WS]    Clinical Course User Index [WS] Cristie Hem, MD                           Medical Decision Making Amount and/or Complexity of Data Reviewed Labs: ordered.  Risk OTC drugs. Prescription drug management.  Patient with multiple medical  comorbidities on 2 L nasal cannula oxygen at baseline.  She does have increased oxygen requirement and does have tachycardia and tachypnea without respiratory distress.  Chest x-ray without acute infiltrate-images personally reviewed and interpreted, agree with radiologist interpretation.  Given the patient has been unable to take her torsemide for several days and does feel volume up will treat with Lasix at this time.  Patient care transferred pending labs and reevaluation.        Final Clinical Impression(s) / ED Diagnoses Final diagnoses:  None    Rx / DC Orders ED Discharge Orders     None         Quintella Reichert, MD 10/26/22 [phone]

## 2022-10-26 NOTE — ED Triage Notes (Signed)
Presents for suspected viral infection - her son was confirmed covid and flu positive yesterday. 2L baseline O2 for CHF and COPD. She was increased to 6L by EMS.  Endorses SOB, cough, body aches, CP, congestions EMS VS: 120bpm, 97%, 173/80. No meds en route. EKG unremarkable en route.

## 2022-10-26 NOTE — H&P (Signed)
History and Physical    Patient: [name] PFX:[id number] DOB: 02/12/1966 DOA: 10/26/2022 DOS: the patient was seen and examined on 10/26/2022 PCP: [name], NP  Patient coming from: Home - lives with daughter; NOK: Lynnell Grain, [place]   Chief Complaint: SOB  HPI: [name] is a 56 y.o. female with medical history significant of chronic diastolic CHF; OHS/OSA on CPAP; COPD on 2L home O2; HTN; hypothyroidism; sarcoidosis; and super morbid obesity (BMI 77.57) presenting with SOB.  She reports that her son called yesterday and said that he wasn't feeling well and went to the ER and tested positive for COVID and flu.  She told him that she also wasn't feeling well and felt worse through the evening, prompting her to come to the ER.  She has had cough, myalgias, SOB despite her usual 2L home O2.      ER Course:  Flu-like symptoms, influenza A positive.  Increased O2 requirement to 6L on arrival, given Lasix.  BNP mildly elevated, CXR clear. Desat with ambulation.  Given Tamiflu.     Review of Systems: As mentioned in the history of present illness. All other systems reviewed and are negative. Past Medical History:  Diagnosis Date   Anemia    Arthritis    hands, shoulders, no meds   CHF (congestive heart failure) (HCC)    EF60-65%   Chronic hyperventilation syndrome    w/ obesity tx with albuterol inhaler and oxygen 2L   COPD (chronic obstructive pulmonary disease) (HCC)    uses oxygen 2 L   Gallstones    GERD (gastroesophageal reflux disease)    diet controlled - no meds   H/O hiatal hernia    Hypertension    Hypothyroidism    Kidney stones    Morbid obesity ([place])    Pneumonia    hoispitalized in 08/2011   Sarcoidosis    Seasonal allergies    Sleep apnea    uses CPAP machine    Past Surgical History:  Procedure Laterality Date   CESAREAN SECTION  1994   x 1   CHOLECYSTECTOMY  2000   DILATION AND CURETTAGE OF UTERUS N/A  08/09/2016   Procedure: DILATATION AND CURETTAGE;  Surgeon: Everitt Amber, MD;  Location: [facility];  Service: Gynecology;  Laterality: N/A;   HYSTEROSCOPY WITH D & C  12/27/2011   Procedure: DILATATION AND CURETTAGE /HYSTEROSCOPY;  Surgeon: Maeola Sarah. Landry Mellow, MD;  Location: [facility];  Service: Gynecology;;   I and D of abcess  05/2011   INTRAUTERINE DEVICE (IUD) INSERTION N/A 08/09/2016   Procedure: INTRAUTERINE DEVICE (IUD) INSERTION;  Surgeon: Everitt Amber, MD;  Location: [facility];  Service: Gynecology;  Laterality: N/A;   LUNG BIOPSY     uterine abletion     Social History:  reports that she has never smoked. She has never used smokeless tobacco. She reports current alcohol use. She reports that she does not use drugs.  Allergies  Allergen Reactions   Tape Itching    EKG Leads cause Skin burns and irritation after 24 hours+    Family History  Problem Relation Age of Onset   Diabetes Father    Cancer Father 55       colon cancer    Diabetes Brother    Deep vein thrombosis Mother    Aneurysm Sister        d/o brain aneurysm    Prior to Admission medications   Medication Sig Start Date End Date Taking? Authorizing  Provider  acetaminophen (TYLENOL) 500 MG tablet Take 1 tablet (500 mg total) by mouth every 6 (six) hours as needed. 08/03/21   Varney Biles, MD  albuterol (VENTOLIN HFA) 108 (90 Base) MCG/ACT inhaler INHALE 1-2 PUFFS BY MOUTH EVERY 6 HOURS AS NEEDED FOR WHEEZE OR SHORTNESS OF BREATH 08/18/22   Nafziger, Tommi Rumps, NP  atorvastatin (LIPITOR) 40 MG tablet Take 1 tablet (40 mg total) by mouth daily at 6 PM. 12/14/21   Nafziger, Tommi Rumps, NP  B-D TB SYRINGE 1CC/27GX1/2" 27G X 1/2" 1 ML MISC USE ONCE A WEEK TO INJECT METHOTREXATE 11/05/19   [provider]  calcium citrate-vitamin D (CITRACAL+D) 315-200 MG-UNIT tablet Take 1 tablet by mouth daily.    [provider]  cetirizine (ZYRTEC) 10 MG tablet Take 10 mg by mouth at bedtime as needed for allergies.    [provider]   cyclobenzaprine (FLEXERIL) 10 MG tablet Take 1 tablet (10 mg total) by mouth 3 (three) times daily as needed for muscle spasms. 08/02/22   Nafziger, Tommi Rumps, NP  famotidine (PEPCID) 40 MG tablet TAKE 1 TABLET BY MOUTH TWICE A DAY 08/11/21   Nafziger, Tommi Rumps, NP  folic acid (FOLVITE) 1 MG tablet Take 1 mg by mouth daily. 01/11/16   [provider]  HYDROcodone-acetaminophen (NORCO) 10-325 MG tablet 1 tablet as needed 09/27/21   [provider]  HYDROcodone-acetaminophen (NORCO) 10-325 MG tablet Take 1 tablet by mouth every 6 hours as needed 08/24/22     losartan (COZAAR) 100 MG tablet TAKE 1 TABLET (100 MG TOTAL) DAILY BY MOUTH. 07/20/21   Nafziger, Tommi Rumps, NP  methotrexate 50 MG/2ML injection Inject 20 mg into the vein once a week. Dose is 0.8 mL ('20mg'$ ). Take on Fridays    [provider]  metoprolol succinate (TOPROL-XL) 25 MG 24 hr tablet Take 1 tablet (25 mg total) by mouth daily. 12/14/21   Nafziger, Tommi Rumps, NP  NON FORMULARY 2 liter of oxygen    [provider]  ondansetron (ZOFRAN ODT) 8 MG disintegrating tablet Take 1 tablet (8 mg total) by mouth every 8 (eight) hours as needed for nausea. 08/03/21   Varney Biles, MD  ondansetron (ZOFRAN) 4 MG tablet Take 1 tablet (4 mg total) by mouth every 8 (eight) hours as needed for nausea or vomiting. 08/02/22   Nafziger, Tommi Rumps, NP  predniSONE (DELTASONE) 10 MG tablet Take 1 tablet (10 mg total) by mouth daily. 01/17/18   Florencia Reasons, MD  torsemide (DEMADEX) 20 MG tablet Take 1 tablet (20 mg total) by mouth daily. Take two tablets ( 40 mg) for increase wt gain of 3 lbs in one day or 5 lbs in one week. Please make overdue appt with Dr. [name] before anymore refills. Thank you 2nd attempt 12/20/21   Jerline Pain, MD  triamcinolone ointment (KENALOG) 0.1 % Apply 1 application topically 2 (two) times daily. For Sarcoidosis    [provider]  vitamin B-12 1000 MCG tablet Take 1 tablet (1,000 mcg total) by mouth daily. 06/19/16    Lavina Hamman, MD    Physical Exam: Vitals:   10/26/22 0842 10/26/22 0845 10/26/22 1045 10/26/22 1332  BP:  (!) 171/70 (!) 151/76 (!) 171/71  Pulse: 100 (!) 102 95 (!) 101  Resp: 18 14 (!) 25 20  Temp:   99.4 F (37.4 C) 100.2 F (37.9 C)  TempSrc:   Oral Oral  SpO2: 99% 98% 100% 100%  Weight:       General:  Appears calm  and comfortable and is in NAD, mildly ill whlie at rest, on 3L [place] O2 Eyes:   EOMI, normal lids, iris ENT:  grossly normal hearing, lips & tongue, mmm Neck:  no LAD, masses or thyromegaly Cardiovascular:  RR with mild tachycardia, distant heart sounds.  Respiratory:   CTA bilaterally with no wheezes/rales/rhonchi.  Normal respiratory effort. Abdomen:  soft, NT, ND, limited by habitus Skin:  no rash or induration seen on limited exam Musculoskeletal:  no bony abnormality Psychiatric:  grossly normal mood and affect, speech fluent and appropriate, AOx3 Neurologic:  CN 2-12 grossly intact, moves all extremities in coordinated fashion   Radiological Exams on Admission: Independently reviewed - see discussion in A/P where applicable  DG Chest Port 1 View  Result Date: 10/26/2022 CLINICAL DATA:  [id number] with sharp chest pain. Shortness of breath also prep EXAM: PORTABLE CHEST 1 VIEW COMPARISON:  Portable chest 08/03/2021 FINDINGS: There is mild cardiomegaly. Central vessels are prominent but less prominent than previously. No interstitial edema seen or pleural effusions. The lungs are clear. The mediastinum is normally outlined. Thoracic spondylosis and degenerative disc disease. There are multiple overlying monitor wires. IMPRESSION: No acute chest findings. Mild cardiomegaly and central vascular prominence but less prominent than previously. No edema or focal airspace disease. Electronically Signed   By: Telford Nab M.D.   On: 10/26/2022 06:46    EKG: Independently reviewed.  Sinus tachycardia with rate 118; nonspecific ST changes with no evidence of acute  ischemia   Labs on Admission: I have personally reviewed the available labs and imaging studies at the time of the admission.  Pertinent labs:    Glucose 117 BUN 12/Creatinine 1.12/GFR 57 - stable VBG: 7.333/60.2/31.9 WBC 5.4 Hgb 11 Influenza A   Assessment and Plan: Principal Problem:   Influenza A Active Problems:   OSA (obstructive sleep apnea)   Obesity hypoventilation syndrome (HCC)   Hypertension   Morbid obesity (HCC)   Hyperlipidemia   Diastolic CHF ([place])   Sarcoidosis of lung (HCC)    Influenza A -Supportive care -Tamiflu BID -Observation on telemetry -Anticipate resolution of tachycardia and improvement in symptoms as flu improves   Chronic diastolic CHF -There was some concern for volume overload in the ER but her habitus precludes accurate volume assessment -Given her current influenza situation, will assume euvolemia for now and monitor clinically -Hold torsemide  OSA/OHA -Continue CPAP qhs -Continue 2L home O2  HTN -Continue Toprol XL -Hold losartan due to renal function for now  HLD -Continue atorvastatin  Sarcoidosis -On weekly methotrexate and daily prednisone  Stage 3a CKD -Appears to be stable at this time -Attempt to avoid nephrotoxic medications -Recheck BMP in AM   Obesity -Body mass index is 77.57 kg/m..  -Weight loss should be encouraged -Outpatient PCP/bariatric medicine f/u encouraged  -Chronic pain with minimal ambulation; continue Norco, Flexeril as needed    Advance Care Planning:   Code Status: Full Code - Code status was discussed with the patient at the time of admission.  The patient would want to receive full resuscitative measures at this time.   Consults: TOC team, nutrition, PT/OT  DVT Prophylaxis: Lovenox  Family Communication: None present; she reports that her family is at her brother's funeral at this time and she is choosing not to tell them that she is here accordingly  Severity of Illness: The  appropriate patient status for this patient is OBSERVATION. Observation status is judged to be reasonable and necessary in order to provide the required intensity  of service to ensure the patient's safety. The patient's presenting symptoms, physical exam findings, and initial radiographic and laboratory data in the context of their medical condition is felt to place them at decreased risk for further clinical deterioration. Furthermore, it is anticipated that the patient will be medically stable for discharge from the hospital within 2 midnights of admission.   Author: Karmen Bongo, MD 10/26/2022 2:25 PM  For on call review [email or web address].

## 2022-10-26 NOTE — ED Provider Notes (Signed)
  ED Course / MDM   Clinical Course as of 10/26/22 0958  Wed Oct 26, 2022  0706 Received sign out from Dr. Ralene Bathe. Pending labs. Presenting with increased shortness of breath, stopped taking torsemide recently and febrile. Family member has Johnson [WS]  [phone] Labs demonstrate chronic hypercapnia.  Otherwise reassuring.  BNP not elevated however patient is severely obese.  Chest x-ray without pulmonary edema.  Discussed with patient, patient reports that she still feels short of breath.  She is on 2 to 3 L nasal cannula and satting well.  Attempted ambulation trial however patient became severely dyspneic and desaturated to 86% on her typical oxygen.  Will give Tamiflu.  Discussed with the hospitalist will admit. [WS]    Clinical Course User Index [WS] Cristie Hem, MD   Medical Decision Making Amount and/or Complexity of Data Reviewed Labs: ordered.  Risk OTC drugs. Prescription drug management. Decision regarding hospitalization.         Cristie Hem, MD 10/26/22 [phone]

## 2022-10-26 NOTE — Care Management (Signed)
  Transition of Care (TOC) Screening Note   Patient Details  Name: [name] Date of Birth: 1966/09/17   Transition of Care [facility]) CM/SW Contact:    Carles Collet, RN Phone Number: 10/26/2022, 1:37 PM    Transition of Care Department [facility]) has reviewed patient and no TOC needs have been identified at this time. We will continue to monitor patient advancement through interdisciplinary progression rounds.   Admitted from home w Flu, 2L O2 baseline, PT OT evals pending

## 2022-10-26 NOTE — ED Provider Triage Note (Signed)
  Emergency Medicine Provider Triage Evaluation Note  MRN:  [id number]  Arrival date & time: 10/26/22    Medically screening exam initiated at 5:33 AM.   CC:   Shortness of Breath   HPI:  [name] is a 56 y.o. year-old female presents to the ED with chief complaint of SOB.  Son diagnosed with COVID and flu.  Patient reports SOB and increased O2 usage.   Hx of CHF and COPD.  On 2L normally, now taking 6L.  History provided by patient. ROS:  -As included in HPI PE:   Vitals:   10/26/22 0525  BP: (!) 200/95  Pulse: (!) 121  Resp: (!) 24  Temp: (!) 102.6 F (39.2 C)  SpO2: 100%    Chronically ill appearing Moderate respiratory distress  MDM:  Based on signs and symptoms, Covid/flu is highest on my differential. I've ordered labs and imaging in triage to expedite lab/diagnostic workup.  Patient was informed that the remainder of the evaluation will be completed by another provider, this initial triage assessment does not replace that evaluation, and the importance of remaining in the ED until their evaluation is complete.    Montine Circle, PA-C 10/26/22 [phone]

## 2022-10-26 NOTE — ED Notes (Signed)
Pt ambulated on 2L. While ambulating pt was very SOB, and sat went down to 86%. MD made aware.

## 2022-10-27 DIAGNOSIS — J101 Influenza due to other identified influenza virus with other respiratory manifestations: Secondary | ICD-10-CM | POA: Diagnosis present

## 2022-10-27 DIAGNOSIS — Z800 Family history of malignant neoplasm of digestive organs: Secondary | ICD-10-CM | POA: Diagnosis not present

## 2022-10-27 DIAGNOSIS — M19011 Primary osteoarthritis, right shoulder: Secondary | ICD-10-CM | POA: Diagnosis present

## 2022-10-27 DIAGNOSIS — M19012 Primary osteoarthritis, left shoulder: Secondary | ICD-10-CM | POA: Diagnosis present

## 2022-10-27 DIAGNOSIS — N1831 Chronic kidney disease, stage 3a: Secondary | ICD-10-CM | POA: Diagnosis present

## 2022-10-27 DIAGNOSIS — E876 Hypokalemia: Secondary | ICD-10-CM | POA: Diagnosis not present

## 2022-10-27 DIAGNOSIS — R0602 Shortness of breath: Secondary | ICD-10-CM | POA: Diagnosis present

## 2022-10-27 DIAGNOSIS — T501X6A Underdosing of loop [high-ceiling] diuretics, initial encounter: Secondary | ICD-10-CM | POA: Diagnosis present

## 2022-10-27 DIAGNOSIS — M19042 Primary osteoarthritis, left hand: Secondary | ICD-10-CM | POA: Diagnosis present

## 2022-10-27 DIAGNOSIS — M19041 Primary osteoarthritis, right hand: Secondary | ICD-10-CM | POA: Diagnosis present

## 2022-10-27 DIAGNOSIS — E039 Hypothyroidism, unspecified: Secondary | ICD-10-CM | POA: Diagnosis present

## 2022-10-27 DIAGNOSIS — D860 Sarcoidosis of lung: Secondary | ICD-10-CM | POA: Diagnosis present

## 2022-10-27 DIAGNOSIS — I5032 Chronic diastolic (congestive) heart failure: Secondary | ICD-10-CM | POA: Diagnosis present

## 2022-10-27 DIAGNOSIS — E662 Morbid (severe) obesity with alveolar hypoventilation: Secondary | ICD-10-CM | POA: Diagnosis present

## 2022-10-27 DIAGNOSIS — R0902 Hypoxemia: Secondary | ICD-10-CM | POA: Diagnosis not present

## 2022-10-27 DIAGNOSIS — Z833 Family history of diabetes mellitus: Secondary | ICD-10-CM | POA: Diagnosis not present

## 2022-10-27 DIAGNOSIS — I130 Hypertensive heart and chronic kidney disease with heart failure and stage 1 through stage 4 chronic kidney disease, or unspecified chronic kidney disease: Secondary | ICD-10-CM | POA: Diagnosis present

## 2022-10-27 DIAGNOSIS — E785 Hyperlipidemia, unspecified: Secondary | ICD-10-CM | POA: Diagnosis present

## 2022-10-27 DIAGNOSIS — Z9981 Dependence on supplemental oxygen: Secondary | ICD-10-CM | POA: Diagnosis not present

## 2022-10-27 DIAGNOSIS — Z6841 Body Mass Index (BMI) 40.0 and over, adult: Secondary | ICD-10-CM | POA: Diagnosis not present

## 2022-10-27 DIAGNOSIS — Z1152 Encounter for screening for COVID-19: Secondary | ICD-10-CM | POA: Diagnosis not present

## 2022-10-27 DIAGNOSIS — J9621 Acute and chronic respiratory failure with hypoxia: Secondary | ICD-10-CM | POA: Diagnosis present

## 2022-10-27 DIAGNOSIS — G8929 Other chronic pain: Secondary | ICD-10-CM | POA: Diagnosis present

## 2022-10-27 DIAGNOSIS — J449 Chronic obstructive pulmonary disease, unspecified: Secondary | ICD-10-CM | POA: Diagnosis present

## 2022-10-27 DIAGNOSIS — K219 Gastro-esophageal reflux disease without esophagitis: Secondary | ICD-10-CM | POA: Diagnosis present

## 2022-10-27 DIAGNOSIS — Z87442 Personal history of urinary calculi: Secondary | ICD-10-CM | POA: Diagnosis not present

## 2022-10-27 LAB — BASIC METABOLIC PANEL
Anion gap: 10 (ref 5–15)
BUN: 16 mg/dL (ref 6–20)
CO2: 28 mmol/L (ref 22–32)
Calcium: 8.2 mg/dL — ABNORMAL LOW (ref 8.9–10.3)
Chloride: 101 mmol/L (ref 98–111)
Creatinine, Ser: 1.08 mg/dL — ABNORMAL HIGH (ref 0.44–1.00)
GFR, Estimated: 60 mL/min — ABNORMAL LOW (ref >60)
Glucose, Bld: 85 mg/dL (ref 70–99)
Potassium: 3.6 mmol/L (ref 3.5–5.1)
Sodium: 139 mmol/L (ref 135–145)

## 2022-10-27 LAB — CBC
HCT: 34.7 % — ABNORMAL LOW (ref 36.0–46.0)
Hemoglobin: 10.6 g/dL — ABNORMAL LOW (ref 12.0–15.0)
MCH: 23.5 pg — ABNORMAL LOW (ref 26.0–34.0)
MCHC: 30.5 g/dL (ref 30.0–36.0)
MCV: 76.9 fL — ABNORMAL LOW (ref 80.0–100.0)
Platelets: 200 10*3/uL (ref 150–400)
RBC: 4.51 MIL/uL (ref 3.87–5.11)
RDW: 22.1 % — ABNORMAL HIGH (ref 11.5–15.5)
WBC: 4.5 10*3/uL (ref 4.0–10.5)
nRBC: 0 % (ref 0.0–0.2)

## 2022-10-27 LAB — HIV ANTIBODY (ROUTINE TESTING W REFLEX): HIV Screen 4th Generation wRfx: NONREACTIVE

## 2022-10-27 MED ORDER — FUROSEMIDE 10 MG/ML IJ SOLN
40.0000 mg | Freq: Two times a day (BID) | INTRAMUSCULAR | Status: DC
  Administered 2022-10-27 – 2022-10-30 (×8): 40 mg via INTRAVENOUS
  Filled 2022-10-27 (×8): qty 4

## 2022-10-27 MED ORDER — IPRATROPIUM-ALBUTEROL 0.5-2.5 (3) MG/3ML IN SOLN
3.0000 mL | Freq: Two times a day (BID) | RESPIRATORY_TRACT | Status: DC
  Administered 2022-10-27 – 2022-10-30 (×6): 3 mL via RESPIRATORY_TRACT
  Filled 2022-10-27 (×7): qty 3

## 2022-10-27 MED ORDER — POTASSIUM CHLORIDE CRYS ER 20 MEQ PO TBCR
40.0000 meq | EXTENDED_RELEASE_TABLET | Freq: Once | ORAL | Status: AC
  Administered 2022-10-27: 40 meq via ORAL
  Filled 2022-10-27: qty 2

## 2022-10-27 NOTE — Evaluation (Signed)
Occupational Therapy Evaluation Patient Details Name: [name] MRN: [id number] DOB: 31-Dec-1965 Today's Date: 10/27/2022   History of Present Illness 56 yo female with PMH significant of CHF, OHS/OSA on CPAP, COPD on 2L home O2, HTN, hypothyroidism, sarcoidosis presenting with shortness of breathe. Sone called yesterday and told her he wasn't feeling well. He went to the ER and tested positive for COVID and flu. She told him that she also wasn't feeling well and felt worse through the evening which prompted her to come to ER. She has cough, myalgias, Shortness of breathe on her usual 2L O2 at home.   Clinical Impression   Pt currently supervision for bed mobility and min guard assist for simulated toilet transfers without use of an assistive device.  Pt tends to try and walk holding onto surfaces and reports that she does not use a walker at home.  When it was discussed to use a walker for safety initially she is resistant.  She has a HH aide 2.5 hours a day that helps with some meals as well as bathing dressing tasks.  Oxygen sats decreasing down to 78% on 3 Ls nasal cannula with short distance mobility of less than 10'.  She needed 1-2 mins to increase back up to 91% with purse lip breathing.  Pt declined using a oxygen mask to help with increasing sats faster as she is a mouth breather.  Feel she will benefit from acute care OT at this time to help increased balance and endurance while providing education on energy conservation techniques.        Recommendations for follow up therapy are one component of a multi-disciplinary discharge planning process, led by the attending physician.  Recommendations may be updated based on patient status, additional functional criteria and insurance authorization.   Follow Up Recommendations  No OT follow up     Assistance Recommended at Discharge PRN  Patient can return home with the following A little help with  bathing/dressing/bathroom;Assistance with cooking/housework;Assist for transportation;Help with stairs or ramp for entrance    Functional Status Assessment  Patient has had a recent decline in their functional status and demonstrates the ability to make significant improvements in function in a reasonable and predictable amount of time.  Equipment Recommendations  None recommended by OT       Precautions / Restrictions Precautions Precautions: Fall Precaution Comments: monitor O2 Restrictions Weight Bearing Restrictions: No      Mobility Bed Mobility Overal bed mobility: Needs Assistance Bed Mobility: Sit to Supine, Supine to Sit     Supine to sit: Supervision Sit to supine: Supervision   General bed mobility comments: Pt needing increased time for transitions    Transfers Overall transfer level: Needs assistance Equipment used: None Transfers: Sit to/from Stand, Bed to chair/wheelchair/BSC Sit to Stand: Min guard     Step pivot transfers: Min guard     General transfer comment: Pt with decreased endurance, needed rest break for ambulation from the side of the bed to the bathroom door using foot of bed and other surfaces for support.      Balance Overall balance assessment: Needs assistance Sitting-balance support: No upper extremity supported, Feet supported Sitting balance-Leahy Scale: Fair     Standing balance support: Single extremity supported, No upper extremity supported, During functional activity Standing balance-Leahy Scale: Poor Standing balance comment: Pt needs UE support for safety during mobility  ADL either performed or assessed with clinical judgement   ADL Overall ADL's : Needs assistance/impaired Eating/Feeding: Independent;Sitting   Grooming: Wash/dry hands;Wash/dry face;Sitting;Set up Grooming Details (indicate cue type and reason): simulated Upper Body Bathing: Supervision/ safety;Sitting Upper Body  Bathing Details (indicate cue type and reason): simulated     Upper Body Dressing : Supervision/safety;Sitting Upper Body Dressing Details (indicate cue type and reason): pullover gown simulated Lower Body Dressing: Total assistance;Sit to/from stand;Sitting/lateral leans Lower Body Dressing Details (indicate cue type and reason): simulated gripper socks only Toilet Transfer: Min guard;Ambulation Toilet Transfer Details (indicate cue type and reason): simulated as pt declined need to toilet         Functional mobility during ADLs: Min guard (no device, pt holding onto surfaces at times such as end of bed) General ADL Comments: Pt with sats at 93% on 3Ls nasal cannula, decreasing down to 79% with ambulation from the EOB over to the bathroom door.  Pt needed to sit and take a break at the door.  Decreased understanding of the need for using a RW to help with balance even with pt reporting pain in bilateral knees for a while.  Feel she will likley not use at home but will benefit.  Will also benefit from energy conservation strategies and endurance building activities. Will double check DME as her son had lots of it from an illness previously per her report.     Vision Baseline Vision/History: 1 Wears glasses Ability to See in Adequate Light: 0 Adequate Patient Visual Report: No change from baseline Vision Assessment?: No apparent visual deficits            Pertinent Vitals/Pain Pain Assessment Pain Assessment: Faces Faces Pain Scale: Hurts a little bit Pain Location: knee pain bilaterally Pain Descriptors / Indicators: Discomfort Pain Intervention(s): Limited activity within patient's tolerance, Monitored during session, Repositioned     Hand Dominance Right   Extremity/Trunk Assessment Upper Extremity Assessment Upper Extremity Assessment: Overall WFL for tasks assessed   Lower Extremity Assessment Lower Extremity Assessment: Defer to PT evaluation   Cervical / Trunk  Assessment Cervical / Trunk Assessment: Normal   Communication Communication Communication: No difficulties   Cognition Arousal/Alertness: Awake/alert Behavior During Therapy: WFL for tasks assessed/performed Overall Cognitive Status: Within Functional Limits for tasks assessed                                       General Comments  Pt is presenting at what appears to be her baseline level of function. She states that she is a little more unstable than usual but does admit to using furniture at home to walk though she has AD she prefers not to use.            Home Living Family/patient expects to be discharged to:: Private residence Living Arrangements: Children (daughter) Available Help at Discharge: Available PRN/intermittently ([place] aide 2.5 hours/7 days a week) Type of Home: Apartment Home Access: Level entry     Home Layout: One level     Bathroom Shower/Tub: Teacher, early years/pre: Standard Bathroom Accessibility: Yes How Accessible: Accessible via walker Home Equipment: [place] (2 wheels);Other (comment) (adjustable)   Additional Comments: Oxygen 2Ls at home for use during the day and CPAP at night      Prior Functioning/Environment Prior Level of Function : Needs assist  ADLs Comments: Aide helps with washing back and putting socks on as well as some home management        OT Problem List: Decreased activity tolerance;Cardiopulmonary status limiting activity;Impaired balance (sitting and/or standing);Decreased knowledge of use of DME or AE;Decreased strength      OT Treatment/Interventions: Self-care/ADL training;Therapeutic exercise;Patient/family education;Balance training;Therapeutic activities;DME and/or AE instruction;Energy conservation    OT Goals(Current goals can be found in the care plan section) Acute Rehab OT Goals Patient Stated Goal: Pt wants to get to breathing better. OT Goal Formulation:  With patient Time For Goal Achievement: 11/10/22 Potential to Achieve Goals: Good  OT Frequency: Min 2X/week       AM-PAC OT "6 Clicks" Daily Activity     Outcome Measure Help from another person eating meals?: None Help from another person taking care of personal grooming?: A Little Help from another person toileting, which includes using toliet, bedpan, or urinal?: A Little Help from another person bathing (including washing, rinsing, drying)?: A Lot Help from another person to put on and taking off regular upper body clothing?: A Little Help from another person to put on and taking off regular lower body clothing?: A Lot 6 Click Score: 17   End of Session Equipment Utilized During Treatment: Oxygen Nurse Communication: Mobility status  Activity Tolerance: Patient limited by fatigue Patient left: in bed;with call bell/phone within reach  OT Visit Diagnosis: Unsteadiness on feet (R26.81);Muscle weakness (generalized) (M62.81)                Time: 3748-2707 OT Time Calculation (min): 30 min Charges:  OT General Charges $OT Visit: 1 Visit OT Evaluation $OT Eval Moderate Complexity: 1 Mod Aigner Horseman OTR/L 10/27/2022, 12:15 PM

## 2022-10-27 NOTE — Evaluation (Signed)
Physical Therapy Evaluation Patient Details Name: [name] MRN: [id number] DOB: [birth year] Today's Date: 10/27/2022  History of Present Illness  56 yo female with PMH significant of CHF, OHS/OSA on CPAP, COPD on 2L home O2, HTN, hypothyroidism, sarcoidosis presenting with shortness of breathe. Sone called yesterday and told her he wasn't feeling well. He went to the ER and tested positive for COVID and flu. She told him that she also wasn't feeling well and felt worse through the evening which prompted her to come to ER. She has cough, myalgias, Shortness of breathe on her usual 2L O2 at home.  Clinical Impression  Pt demonstrates ability to perform transfers and gait at close to baseline. She was getting some assistance at home from an aide 7 days/week and lives with her daughter who is able to assist. Pt reports that she was furniture walking at home though she has an AD. Extensive education on the importance and benefits of AD and despite pt stating AD will fit around home she declines use. Further education and encouragement is needed if possible in order to decrease risk for falls. Currently no recommended skilled physical therapy services on discharge from acute care hospital setting. Will continue to see pt in order to return to PLOF while in acute care services. Pt O2 sats drop significantly with short distance gait and pt has difficulty breathing nasally in order to bring up O2 sat.      Recommendations for follow up therapy are one component of a multi-disciplinary discharge planning process, led by the attending physician.  Recommendations may be updated based on patient status, additional functional criteria and insurance authorization.  Follow Up Recommendations No PT follow up      Assistance Recommended at Discharge Intermittent Supervision/Assistance  Patient can return home with the following  A little help with walking and/or transfers;Help with stairs or ramp for  entrance;Assistance with cooking/housework;Assist for transportation    Equipment Recommendations None recommended by PT  Recommendations for Other Services       Functional Status Assessment Patient has had a recent decline in their functional status and/or demonstrates limited ability to make significant improvements in function in a reasonable and predictable amount of time     Precautions / Restrictions Precautions Precautions: Fall Restrictions Weight Bearing Restrictions: No      Mobility  Bed Mobility Overal bed mobility: Needs Assistance Bed Mobility: Sit to Supine       Sit to supine: Supervision   General bed mobility comments: Pt uses momentum to get bil LE up into the bed. She may do better at home with a larger bed.    Transfers Overall transfer level: Needs assistance Equipment used:  (Pt furniture walks and declines RW) Transfers: Sit to/from Stand, Bed to chair/wheelchair/BSC Sit to Stand: Min guard   Step pivot transfers: Min guard       General transfer comment: Pt has antalgic gait pattern with moderate lateral sway. Unstable on feet but does not like to use AD despite education.    Ambulation/Gait Ambulation/Gait assistance: Min guard Gait Distance (Feet): 10 Feet Assistive device:  (pt was furniture walking in hospital room) Gait Pattern/deviations: Step-through pattern, Decreased step length - right, Decreased step length - left, Antalgic, Wide base of support   Gait velocity interpretation: <1.31 ft/sec, indicative of household ambulator   General Gait Details: forward flexed posture during gait with moderate lateral say and antalgic gait pattern with very short step length bil and low foot clearance. Pt  is at a high risk for falls from gait pattern. Extensive education on use of AD for endurance, balance and pain but pt declines use of AD.  Stairs Stairs:  (Not applicable. level entry)          Wheelchair Mobility    Modified  [place] (Stroke Patients Only)       Balance Overall balance assessment: Needs assistance Sitting-balance support: No upper extremity supported, Feet supported Sitting balance-Leahy Scale: Fair     Standing balance support: Single extremity supported, No upper extremity supported, During functional activity Standing balance-Leahy Scale: Poor           Pertinent Vitals/Pain Pain Assessment Pain Assessment: Faces Faces Pain Scale: Hurts a little bit Pain Location: knee pain bilaterally Pain Descriptors / Indicators: Discomfort Pain Intervention(s): Limited activity within patient's tolerance    Home Living Family/patient expects to be discharged to:: Private residence Living Arrangements: Children (daughter) Available Help at Discharge: Available PRN/intermittently ([place] aide 2.5 hours/7 days a week) Type of Home: Apartment Home Access: Level entry       Home Layout: One level Home Equipment: Conservation officer, nature (2 wheels);Other (comment) (adjustable) Additional Comments: Oxygen 2Ls at home for use during the day and CPAP at night    Prior Function Prior Level of Function : Needs assist               ADLs Comments: Aide helps with washing back and putting socks on as well as some home management     Hand Dominance   Dominant Hand: Right    Extremity/Trunk Assessment   Upper Extremity Assessment Upper Extremity Assessment: Defer to OT evaluation    Lower Extremity Assessment Lower Extremity Assessment: Generalized weakness       Communication   Communication: No difficulties  Cognition Arousal/Alertness: Awake/alert Behavior During Therapy: WFL for tasks assessed/performed Overall Cognitive Status: Within Functional Limits for tasks assessed              General Comments General comments (skin integrity, edema, etc.): Pt is presenting at what appears to be her baseline level of function. She states that she is a little more unstable than usual but does  admit to using furniture at home to walk though she has AD she prefers not to use.        Assessment/Plan    PT Assessment Patient needs continued PT services  PT Problem List Decreased strength;Decreased balance;Decreased mobility;Decreased activity tolerance       PT Treatment Interventions Functional mobility training;Balance training;Patient/family education;Gait training;Therapeutic activities;Neuromuscular re-education;Stair training;Therapeutic exercise;Manual techniques    PT Goals (Current goals can be found in the Care Plan section)  Acute Rehab PT Goals Patient Stated Goal: To return home PT Goal Formulation: With patient Time For Goal Achievement: 11/10/22 Potential to Achieve Goals: Fair    Frequency Min 2X/week        AM-PAC PT "6 Clicks" Mobility  Outcome Measure Help needed turning from your back to your side while in a flat bed without using bedrails?: A Little Help needed moving from lying on your back to sitting on the side of a flat bed without using bedrails?: A Little Help needed moving to and from a bed to a chair (including a wheelchair)?: A Little Help needed standing up from a chair using your arms (e.g., wheelchair or bedside chair)?: A Little Help needed to walk in hospital room?: A Little Help needed climbing 3-5 steps with a railing? : A Lot 6 Click Score: 17  End of Session Equipment Utilized During Treatment: Oxygen Activity Tolerance: Other (comment) (pt was limited by O2 sats down to 78% on 3L O2 via [place]. pt has difficutly with nasal breathing but declined suggestion for mouth piece.) Patient left: in bed;with call bell/phone within reach Nurse Communication: Mobility status PT Visit Diagnosis: Unsteadiness on feet (R26.81);Other abnormalities of gait and mobility (R26.89);Muscle weakness (generalized) (M62.81)    Time: 8335-8251 PT Time Calculation (min) (ACUTE ONLY): 24 min   Charges:   PT Evaluation $PT Eval Low Complexity: [place], DPT, CLT  Acute Rehabilitation Services Office: [phone] (Secure chat preferred)   Ander Purpura 10/27/2022, 10:31 AM

## 2022-10-27 NOTE — Progress Notes (Signed)
PROGRESS NOTE    [name]  IDP:[id number] DOB: July 20, 1966 DOA: 10/26/2022 PCP: [name], NP   Brief Narrative: 56 year old with past medical history significant of chronic diastolic heart failure, OHS, OSA, on CPAP, COPD on 2 L of oxygen, hypertension, hypothyroidism, sarcoidosis morbid obesity BMI 77 presented with shortness of breath.  She has a sick contact her son was positive for COVID and flu.  She presented with cough, myalgia, shortness of breath.  Evaluation in the ED she was found to be positive for influenza A.    Assessment & Plan:   Principal Problem:   Influenza A Active Problems:   OSA (obstructive sleep apnea)   Obesity hypoventilation syndrome (HCC)   Hypertension   Morbid obesity (HCC)   Hyperlipidemia   Diastolic CHF ([place])   Sarcoidosis of lung ([place])  1-Influenza A: Supportive care Continue with Tamiflu Start Duo-neb.    Acute Chronic diastolic heart failure She is more hypoxic on exertion.  Start 40 mg IV lasix BID.  Strict I and O.  Monitor renal function.   OSA/OHA: Continue CPAP at bedtime Continue with chronic 2 L of oxygen  Hypertension: Continue with Toprol Holding losartan    Hyperlipidemia: Continue with atorvastatin  Sarcoidosis: On weekly methotrexate and daily prednisone  Stage IIIa CKD: Creatinine range 1.3--1.5 Monitor on IV lasix.   Super morbid obesity BMI: 77 Needs life style modification,.       Estimated body mass index is 77.57 kg/m as calculated from the following:   Height as of 11/22/21: '5\' 6"'$  (1.676 m).   Weight as of this encounter: 218 kg.   DVT prophylaxis: Lovenox Code Status: Full code Family Communication: Care discussed with patient.  Disposition Plan:  Status is: Observation The patient remains OBS appropriate and will d/c before 2 midnights.    Consultants:  none  Procedures:  none  Antimicrobials:    Subjective: She report cough, SOB not at baseline. Oxygen  drop to 75  on 3 L oxygen while she walk from bed to  chair in he room with PT    Objective: Vitals:   10/26/22 2117 10/26/22 2328 10/27/22 0255 10/27/22 0501  BP: (!) 163/62  132/65 (!) (P) 143/75  Pulse: 98 87 72   Resp: '19 18 18   '$ Temp: (!) 100.5 F (38.1 C)  98.9 F (37.2 C)   TempSrc: Oral  Oral   SpO2: 99% 92% 94%   Weight:        Intake/Output Summary (Last 24 hours) at 10/27/2022 0719 Last data filed at 10/26/2022 2358 Gross per 24 hour  Intake 240 ml  Output 475 ml  Net -235 ml   Filed Weights   10/26/22 0531  Weight: (!) 218 kg    Examination:  General exam: Appears calm and comfortable  Respiratory system: BL crackles.  Cardiovascular system: S1 & S2 heard, RRR. No JVD, murmurs, rubs, gallops or clicks. No pedal edema. Gastrointestinal system: Abdomen is nondistended, soft and nontender. No organomegaly or masses felt. Normal bowel sounds heard. Central nervous system: Alert and oriented. No focal neurological deficits. Extremities: Symmetric 5 x 5 power.    Data Reviewed: I have personally reviewed following labs and imaging studies  CBC: Recent Labs  Lab 10/26/22 0600 10/26/22 0635  WBC 5.4  --   HGB 11.0* 12.9  HCT 38.3 38.0  MCV 78.8*  --   PLT 200  --    Basic Metabolic Panel: Recent Labs  Lab 10/26/22 0600 10/26/22 4431  NA 137 139  K 3.6 3.7  CL 96*  --   CO2 30  --   GLUCOSE 117*  --   BUN 12  --   CREATININE 1.12*  --   CALCIUM 8.6*  --    GFR: CrCl cannot be calculated (Unknown ideal weight.). Liver Function Tests: Recent Labs  Lab 10/26/22 0600  AST 17  ALT 11  ALKPHOS 47  BILITOT 0.7  PROT 7.6  ALBUMIN 3.7   No results for input(s): "LIPASE", "AMYLASE" in the last 168 hours. No results for input(s): "AMMONIA" in the last 168 hours. Coagulation Profile: No results for input(s): "INR", "PROTIME" in the last 168 hours. Cardiac Enzymes: No results for input(s): "CKTOTAL", "CKMB", "CKMBINDEX", "TROPONINI" in the  last 168 hours. BNP (last 3 results) No results for input(s): "PROBNP" in the last 8760 hours. HbA1C: No results for input(s): "HGBA1C" in the last 72 hours. CBG: No results for input(s): "GLUCAP" in the last 168 hours. Lipid Profile: No results for input(s): "CHOL", "HDL", "LDLCALC", "TRIG", "CHOLHDL", "LDLDIRECT" in the last 72 hours. Thyroid Function Tests: No results for input(s): "TSH", "T4TOTAL", "FREET4", "T3FREE", "THYROIDAB" in the last 72 hours. Anemia Panel: No results for input(s): "VITAMINB12", "FOLATE", "FERRITIN", "TIBC", "IRON", "RETICCTPCT" in the last 72 hours. Sepsis Labs: No results for input(s): "PROCALCITON", "LATICACIDVEN" in the last 168 hours.  Recent Results (from the past 240 hour(s))  Resp panel by RT-PCR (RSV, Flu A&B, Covid) Anterior Nasal Swab     Status: Abnormal   Collection Time: 10/26/22  6:00 AM   Specimen: Anterior Nasal Swab  Result Value Ref Range Status   SARS Coronavirus 2 by RT PCR NEGATIVE NEGATIVE Final    Comment: (NOTE) SARS-CoV-2 target nucleic acids are NOT DETECTED.  The SARS-CoV-2 RNA is generally detectable in upper respiratory specimens during the acute phase of infection. The lowest concentration of SARS-CoV-2 viral copies this assay can detect is 138 copies/mL. A negative result does not preclude SARS-Cov-2 infection and should not be used as the sole basis for treatment or other patient management decisions. A negative result may occur with  improper specimen collection/handling, submission of specimen other than nasopharyngeal swab, presence of viral mutation(s) within the areas targeted by this assay, and inadequate number of viral copies(<138 copies/mL). A negative result must be combined with clinical observations, patient history, and epidemiological information. The expected result is Negative.  Fact Sheet for Patients:  EntrepreneurPulse.com.au  Fact Sheet for Healthcare Providers:   IncredibleEmployment.be  This test is no t yet approved or cleared by the [place] FDA and  has been authorized for detection and/or diagnosis of SARS-CoV-2 by FDA under an Emergency Use Authorization (EUA). This EUA will remain  in effect (meaning this test can be used) for the duration of the COVID-19 declaration under Section 564(b)(1) of the Act, 21 U.S.C.section 360bbb-3(b)(1), unless the authorization is terminated  or revoked sooner.       Influenza A by PCR POSITIVE (A) NEGATIVE Final   Influenza B by PCR NEGATIVE NEGATIVE Final    Comment: (NOTE) The Xpert Xpress SARS-CoV-2/FLU/RSV plus assay is intended as an aid in the diagnosis of influenza from Nasopharyngeal swab specimens and should not be used as a sole basis for treatment. Nasal washings and aspirates are unacceptable for Xpert Xpress SARS-CoV-2/FLU/RSV testing.  Fact Sheet for Patients: EntrepreneurPulse.com.au  Fact Sheet for Healthcare Providers: IncredibleEmployment.be  This test is not yet approved or cleared by the [place] and has been authorized for  detection and/or diagnosis of SARS-CoV-2 by FDA under an Emergency Use Authorization (EUA). This EUA will remain in effect (meaning this test can be used) for the duration of the COVID-19 declaration under Section 564(b)(1) of the Act, 21 U.S.C. section 360bbb-3(b)(1), unless the authorization is terminated or revoked.     Resp Syncytial Virus by PCR NEGATIVE NEGATIVE Final    Comment: (NOTE) Fact Sheet for Patients: EntrepreneurPulse.com.au  Fact Sheet for Healthcare Providers: IncredibleEmployment.be  This test is not yet approved or cleared by the [place] FDA and has been authorized for detection and/or diagnosis of SARS-CoV-2 by FDA under an Emergency Use Authorization (EUA). This EUA will remain in effect (meaning this test can be used)  for the duration of the COVID-19 declaration under Section 564(b)(1) of the Act, 21 U.S.C. section 360bbb-3(b)(1), unless the authorization is terminated or revoked.  Performed at [facility], [place] [address], [place], [place] [address]          Radiology Studies: DG Chest Port 1 View  Result Date: 10/26/2022 CLINICAL DATA:  [id number] with sharp chest pain. Shortness of breath also prep EXAM: PORTABLE CHEST 1 VIEW COMPARISON:  Portable chest 08/03/2021 FINDINGS: There is mild cardiomegaly. Central vessels are prominent but less prominent than previously. No interstitial edema seen or pleural effusions. The lungs are clear. The mediastinum is normally outlined. Thoracic spondylosis and degenerative disc disease. There are multiple overlying monitor wires. IMPRESSION: No acute chest findings. Mild cardiomegaly and central vascular prominence but less prominent than previously. No edema or focal airspace disease. Electronically Signed   By: Telford Nab M.D.   On: 10/26/2022 06:46        Scheduled Meds:  atorvastatin  40 mg Oral q1800   docusate sodium  100 mg Oral BID   enoxaparin (LOVENOX) injection  40 mg Subcutaneous Q24H   famotidine  40 mg Oral BID   folic acid  1 mg Oral Daily   metoprolol succinate  25 mg Oral Daily   oseltamivir  75 mg Oral BID   predniSONE  10 mg Oral Daily   sodium chloride flush  3 mL Intravenous Q12H   Continuous Infusions:   LOS: 0 days    Time spent: 35 minutes    Tashi Andujo A Milburn Freeney, MD [facility]   If 7PM-7AM, please contact night-coverage [email or web address]  10/27/2022, 7:19 AM

## 2022-10-28 DIAGNOSIS — J101 Influenza due to other identified influenza virus with other respiratory manifestations: Secondary | ICD-10-CM | POA: Diagnosis not present

## 2022-10-28 LAB — BASIC METABOLIC PANEL
Anion gap: 10 (ref 5–15)
BUN: 17 mg/dL (ref 6–20)
CO2: 33 mmol/L — ABNORMAL HIGH (ref 22–32)
Calcium: 8.3 mg/dL — ABNORMAL LOW (ref 8.9–10.3)
Chloride: 96 mmol/L — ABNORMAL LOW (ref 98–111)
Creatinine, Ser: 1.13 mg/dL — ABNORMAL HIGH (ref 0.44–1.00)
GFR, Estimated: 57 mL/min — ABNORMAL LOW (ref >60)
Glucose, Bld: 86 mg/dL (ref 70–99)
Potassium: 3.3 mmol/L — ABNORMAL LOW (ref 3.5–5.1)
Sodium: 139 mmol/L (ref 135–145)

## 2022-10-28 MED ORDER — POTASSIUM CHLORIDE CRYS ER 20 MEQ PO TBCR
40.0000 meq | EXTENDED_RELEASE_TABLET | ORAL | Status: AC
  Administered 2022-10-28 (×2): 40 meq via ORAL
  Filled 2022-10-28 (×2): qty 2

## 2022-10-28 MED ORDER — CALCIUM CITRATE 950 (200 CA) MG PO TABS
200.0000 mg | ORAL_TABLET | Freq: Every day | ORAL | Status: DC
  Administered 2022-10-28 – 2022-11-01 (×5): 200 mg via ORAL
  Filled 2022-10-28 (×5): qty 1

## 2022-10-28 MED ORDER — CALCIUM CITRATE-VITAMIN D 315-200 MG-UNIT PO TABS: 1.0000 | ORAL_TABLET | Freq: Every day | ORAL | Status: DC

## 2022-10-28 MED ORDER — TRIAMCINOLONE ACETONIDE 0.1 % EX OINT
1.0000 | TOPICAL_OINTMENT | Freq: Two times a day (BID) | CUTANEOUS | Status: DC
  Administered 2022-10-28 – 2022-10-31 (×7): 1 via TOPICAL
  Filled 2022-10-28: qty 15

## 2022-10-28 MED ORDER — CHOLECALCIFEROL 10 MCG (400 UNIT) PO TABS
400.0000 [IU] | ORAL_TABLET | Freq: Every day | ORAL | Status: DC
  Administered 2022-10-28 – 2022-11-01 (×5): 400 [IU] via ORAL
  Filled 2022-10-28 (×5): qty 1

## 2022-10-28 NOTE — Progress Notes (Signed)
PROGRESS NOTE    [name]  MAU:[id number] DOB: 1966-03-01 DOA: 10/26/2022 PCP: [name], NP   Brief Narrative: 56 year old with past medical history significant of chronic diastolic heart failure, OHS, OSA, on CPAP, COPD on 2 L of oxygen, hypertension, hypothyroidism, sarcoidosis morbid obesity BMI 77 presented with shortness of breath.  She has a sick contact her son was positive for COVID and flu.  She presented with cough, myalgia, shortness of breath.  Evaluation in the ED she was found to be positive for influenza A.    Assessment & Plan:   Principal Problem:   Influenza A Active Problems:   OSA (obstructive sleep apnea)   Obesity hypoventilation syndrome (HCC)   Hypertension   Morbid obesity (HCC)   Hyperlipidemia   Diastolic CHF ([place])   Sarcoidosis of lung ([place])  1-Influenza A: Supportive care Continue with Tamiflu Continue with  Duo-neb.    Acute Chronic diastolic heart failure She is more hypoxic on exertion.  Continue with  40 mg IV lasix BID.  Strict I and O.  Monitor renal function.  Negative 2 L.   OSA/OHA: Acute on chronic Hypoxic Resp failure.  Continue CPAP at bedtime Continue with chronic 2 L of oxygen Might need higher amount of oxygen on exertion.  IV lasix.   Hypertension: Continue with Toprol Holding losartan   Hypokalemia; replete orally.   Hyperlipidemia: Continue with atorvastatin  Sarcoidosis: On weekly methotrexate and daily prednisone  Stage IIIa CKD: Creatinine range 1.3--1.5 Monitor on IV lasix.   Super morbid obesity BMI: 77 Needs life style modification,.       Estimated body mass index is 77.57 kg/m as calculated from the following:   Height as of 11/22/21: '5\' 6"'$  (1.676 m).   Weight as of this encounter: 218 kg.   DVT prophylaxis: Lovenox Code Status: Full code Family Communication: Care discussed with patient.  Disposition Plan:  Status is: Observation The patient remains OBS appropriate  and will d/c before 2 midnights.    Consultants:  none  Procedures:  none  Antimicrobials:    Subjective: She report some improvement. Still oxygen dropping on exertion. Report cough and dyspnea.     Objective: Vitals:   10/28/22 0453 10/28/22 0743 10/28/22 0927 10/28/22 1430  BP: (!) 153/73 134/66  117/75  Pulse: 95 80  85  Resp: '17 16  17  '$ Temp: 98.5 F (36.9 C) 98 F (36.7 C)  98.5 F (36.9 C)  TempSrc: Oral Oral  Oral  SpO2: (!) 87% 98% 100% 97%  Weight:        Intake/Output Summary (Last 24 hours) at 10/28/2022 1503 Last data filed at 10/28/2022 1426 Gross per 24 hour  Intake 920 ml  Output 3200 ml  Net -2280 ml    Filed Weights   10/26/22 0531  Weight: (!) 218 kg    Examination:  General exam: NAD Respiratory system: BL crackles.  Cardiovascular system: S 1, S 2 RRR Gastrointestinal system: BS present, soft, nt Central nervous system: alert, follows command.  Extremities: Symmetric power.     Data Reviewed: I have personally reviewed following labs and imaging studies  CBC: Recent Labs  Lab 10/26/22 0600 10/26/22 0635 10/27/22 0942  WBC 5.4  --  4.5  HGB 11.0* 12.9 10.6*  HCT 38.3 38.0 34.7*  MCV 78.8*  --  76.9*  PLT 200  --  563    Basic Metabolic Panel: Recent Labs  Lab 10/26/22 0600 10/26/22 0635 10/27/22 0640 10/28/22 8937  NA 137 139 139 139  K 3.6 3.7 3.6 3.3*  CL 96*  --  101 96*  CO2 30  --  28 33*  GLUCOSE 117*  --  85 86  BUN 12  --  16 17  CREATININE 1.12*  --  1.08* 1.13*  CALCIUM 8.6*  --  8.2* 8.3*    GFR: CrCl cannot be calculated (Unknown ideal weight.). Liver Function Tests: Recent Labs  Lab 10/26/22 0600  AST 17  ALT 11  ALKPHOS 47  BILITOT 0.7  PROT 7.6  ALBUMIN 3.7    No results for input(s): "LIPASE", "AMYLASE" in the last 168 hours. No results for input(s): "AMMONIA" in the last 168 hours. Coagulation Profile: No results for input(s): "INR", "PROTIME" in the last 168 hours. Cardiac  Enzymes: No results for input(s): "CKTOTAL", "CKMB", "CKMBINDEX", "TROPONINI" in the last 168 hours. BNP (last 3 results) No results for input(s): "PROBNP" in the last 8760 hours. HbA1C: No results for input(s): "HGBA1C" in the last 72 hours. CBG: No results for input(s): "GLUCAP" in the last 168 hours. Lipid Profile: No results for input(s): "CHOL", "HDL", "LDLCALC", "TRIG", "CHOLHDL", "LDLDIRECT" in the last 72 hours. Thyroid Function Tests: No results for input(s): "TSH", "T4TOTAL", "FREET4", "T3FREE", "THYROIDAB" in the last 72 hours. Anemia Panel: No results for input(s): "VITAMINB12", "FOLATE", "FERRITIN", "TIBC", "IRON", "RETICCTPCT" in the last 72 hours. Sepsis Labs: No results for input(s): "PROCALCITON", "LATICACIDVEN" in the last 168 hours.  Recent Results (from the past 240 hour(s))  Resp panel by RT-PCR (RSV, Flu A&B, Covid) Anterior Nasal Swab     Status: Abnormal   Collection Time: 10/26/22  6:00 AM   Specimen: Anterior Nasal Swab  Result Value Ref Range Status   SARS Coronavirus 2 by RT PCR NEGATIVE NEGATIVE Final    Comment: (NOTE) SARS-CoV-2 target nucleic acids are NOT DETECTED.  The SARS-CoV-2 RNA is generally detectable in upper respiratory specimens during the acute phase of infection. The lowest concentration of SARS-CoV-2 viral copies this assay can detect is 138 copies/mL. A negative result does not preclude SARS-Cov-2 infection and should not be used as the sole basis for treatment or other patient management decisions. A negative result may occur with  improper specimen collection/handling, submission of specimen other than nasopharyngeal swab, presence of viral mutation(s) within the areas targeted by this assay, and inadequate number of viral copies(<138 copies/mL). A negative result must be combined with clinical observations, patient history, and epidemiological information. The expected result is Negative.  Fact Sheet for Patients:   EntrepreneurPulse.com.au  Fact Sheet for Healthcare Providers:  IncredibleEmployment.be  This test is no t yet approved or cleared by the [place] FDA and  has been authorized for detection and/or diagnosis of SARS-CoV-2 by FDA under an Emergency Use Authorization (EUA). This EUA will remain  in effect (meaning this test can be used) for the duration of the COVID-19 declaration under Section 564(b)(1) of the Act, 21 U.S.C.section 360bbb-3(b)(1), unless the authorization is terminated  or revoked sooner.       Influenza A by PCR POSITIVE (A) NEGATIVE Final   Influenza B by PCR NEGATIVE NEGATIVE Final    Comment: (NOTE) The Xpert Xpress SARS-CoV-2/FLU/RSV plus assay is intended as an aid in the diagnosis of influenza from Nasopharyngeal swab specimens and should not be used as a sole basis for treatment. Nasal washings and aspirates are unacceptable for Xpert Xpress SARS-CoV-2/FLU/RSV testing.  Fact Sheet for Patients: EntrepreneurPulse.com.au  Fact Sheet for Healthcare Providers: IncredibleEmployment.be  This test is not yet approved or cleared by the [place] and has been authorized for detection and/or diagnosis of SARS-CoV-2 by FDA under an Emergency Use Authorization (EUA). This EUA will remain in effect (meaning this test can be used) for the duration of the COVID-19 declaration under Section 564(b)(1) of the Act, 21 U.S.C. section 360bbb-3(b)(1), unless the authorization is terminated or revoked.     Resp Syncytial Virus by PCR NEGATIVE NEGATIVE Final    Comment: (NOTE) Fact Sheet for Patients: EntrepreneurPulse.com.au  Fact Sheet for Healthcare Providers: IncredibleEmployment.be  This test is not yet approved or cleared by the [place] FDA and has been authorized for detection and/or diagnosis of SARS-CoV-2 by FDA under an Emergency Use  Authorization (EUA). This EUA will remain in effect (meaning this test can be used) for the duration of the COVID-19 declaration under Section 564(b)(1) of the Act, 21 U.S.C. section 360bbb-3(b)(1), unless the authorization is terminated or revoked.  Performed at [facility], [place] [address] [place], [place] [address]          Radiology Studies: No results found.      Scheduled Meds:  atorvastatin  40 mg Oral q1800   calcium citrate  200 mg of elemental calcium Oral Daily   And   cholecalciferol  400 Units Oral Daily   docusate sodium  100 mg Oral BID   enoxaparin (LOVENOX) injection  40 mg Subcutaneous Q24H   famotidine  40 mg Oral BID   folic acid  1 mg Oral Daily   furosemide  40 mg Intravenous BID   ipratropium-albuterol  3 mL Nebulization BID   metoprolol succinate  25 mg Oral Daily   oseltamivir  75 mg Oral BID   predniSONE  10 mg Oral Daily   sodium chloride flush  3 mL Intravenous Q12H   triamcinolone ointment  1 Application Topical BID   Continuous Infusions:   LOS: 1 day    Time spent: 35 minutes    Augusten Lipkin A Javaughn Opdahl, MD [facility]   If 7PM-7AM, please contact night-coverage [email or web address]  10/28/2022, 3:03 PM

## 2022-10-28 NOTE — Progress Notes (Signed)
Pt has CPAP at bedside and states she can place on herself. Pt instructed to call RT if she needs any further help.

## 2022-10-28 NOTE — Progress Notes (Signed)
Occupational Therapy Treatment Patient Details Name: [name] MRN: [id number] DOB: Dec 17, 1965 Today's Date: 10/28/2022   History of present illness 56 yo female with PMH significant of CHF, OHS/OSA on CPAP, COPD on 2L home O2, HTN, hypothyroidism, sarcoidosis presenting with shortness of breathe. Sone called yesterday and told her he wasn't feeling well. He went to the ER and tested positive for COVID and flu. She told him that she also wasn't feeling well and felt worse through the evening which prompted her to come to ER. She has cough, myalgias, Shortness of breathe on her usual 2L O2 at home.   OT comments  Pt completed toilet transfers and functional mobility in the room with and without use of the wide RW.  Pt still prefers to ambulate without assistive device even though she reports increased pain in the left LE with weightbearing.  Oxygen sats at 92% on 2Ls nasal cannula at rest.  They decrease down to 78-79 with minimal mobility in the room.  Dyspnea 3/4 with 2-3 mins purse lip breathing and rest needed to increase back up above 90%.     Recommendations for follow up therapy are one component of a multi-disciplinary discharge planning process, led by the attending physician.  Recommendations may be updated based on patient status, additional functional criteria and insurance authorization.    Follow Up Recommendations  No OT follow up     Assistance Recommended at Discharge PRN  Patient can return home with the following  A little help with bathing/dressing/bathroom;Assistance with cooking/housework;Assist for transportation;Help with stairs or ramp for entrance   Equipment Recommendations  Tub/shower bench       Precautions / Restrictions Precautions Precautions: Fall Precaution Comments: monitor O2 Restrictions Weight Bearing Restrictions: No       Mobility Bed Mobility Overal bed mobility: Needs Assistance Bed Mobility: Supine to Sit     Supine to sit:  Supervision          Transfers Overall transfer level: Needs assistance Equipment used: Rolling walker (2 wheels) Transfers: Sit to/from Stand Sit to Stand: Modified independent (Device/Increase time)     Step pivot transfers: Supervision     General transfer comment: Pt ambulated with RW and supervision with therapist managing O2 line and holding telemetry box.  She then ambulated without the RW as she choses to not use it.     Balance Overall balance assessment: Needs assistance Sitting-balance support: No upper extremity supported, Feet supported Sitting balance-Leahy Scale: Fair     Standing balance support: Single extremity supported, No upper extremity supported, During functional activity Standing balance-Leahy Scale: Fair Standing balance comment: Pt ambulates without UE support but wide BOS and increased pain in the LLE.                           ADL either performed or assessed with clinical judgement   ADL Overall ADL's : Needs assistance/impaired                         Toilet Transfer: Supervision/safety;Ambulation   Toileting- Clothing Manipulation and Hygiene: Supervision/safety;Sit to/from stand       Functional mobility during ADLs: Supervision/safety;Rolling walker (2 wheels) (wide RW) General ADL Comments: Provided reacher for pt to use at home secondary to her current one being broken.  She agreed to try wide RW during session but with use, still doesn't like it and would rather hobble without it in order  to keep weight off of the LLE.  Oxygen sats at 92% on 2Ls nasal cannula, decreasing down to 79% with mobiilty.  She needs 2-3 mins rest and purse lip breathing to bring back up to above 90%.  Discussed possible need for a tub bench.  Pt reports that she would like to have.  Will discuss with SW.  Energy conservation strategies handout provided as well for reference with discussion on conservation during selfcare tasks.       Cognition Arousal/Alertness: Awake/alert Behavior During Therapy: WFL for tasks assessed/performed Overall Cognitive Status: Within Functional Limits for tasks assessed                                                 Pertinent Vitals/ Pain       Pain Assessment Pain Assessment: 0-10 Faces Pain Scale: Hurts a little bit Pain Location: left knee Pain Descriptors / Indicators: Discomfort Pain Intervention(s): Limited activity within patient's tolerance         Frequency  Min 2X/week        Progress Toward Goals  OT Goals(current goals can now be found in the care plan section)  Progress towards OT goals: Progressing toward goals  Acute Rehab OT Goals Patient Stated Goal: Pt hopes to go home tomorrow OT Goal Formulation: With patient Time For Goal Achievement: 11/10/22 Potential to Achieve Goals: Good  Plan Discharge plan remains appropriate       AM-PAC OT "6 Clicks" Daily Activity     Outcome Measure   Help from another person eating meals?: None Help from another person taking care of personal grooming?: A Little Help from another person toileting, which includes using toliet, bedpan, or urinal?: A Little Help from another person bathing (including washing, rinsing, drying)?: A Lot Help from another person to put on and taking off regular upper body clothing?: A Little Help from another person to put on and taking off regular lower body clothing?: A Lot 6 Click Score: 17    End of Session Equipment Utilized During Treatment: Oxygen  OT Visit Diagnosis: Unsteadiness on feet (R26.81);Muscle weakness (generalized) (M62.81)   Activity Tolerance Patient limited by fatigue   Patient Left in bed;with call bell/phone within reach   Nurse Communication Mobility status        Time: 9735-3299 OT Time Calculation (min): 31 min  Charges: OT General Charges $OT Visit: 1 Visit OT Treatments $Self Care/Home Management : 23-37  mins  Nari Vannatter OTR/L 10/28/2022, 1:59 PM

## 2022-10-28 NOTE — Progress Notes (Signed)
Nutrition Brief Note  RD received a consult for nutritional goals.   Pt admitted with influenza A. Pt reports that her appetite remains the same as it was PTA. Unsure of any recent weight loss, does not weigh herself regularly. States that she has been ordering her meals ok with no difficulty, states she is eating about half of what they send.   Body mass index is 77.57 kg/m. Patient meets criteria for morbid obesity based on current BMI.   Current diet order is Regular, patient is consuming approximately 90% of meals at this time. Labs and medications reviewed.   No nutrition interventions warranted at this time. If nutrition issues arise, please consult RD.   Hermina Barters RD, LDN Clinical Dietitian See Shea Evans for contact information.

## 2022-10-28 NOTE — Progress Notes (Signed)
RT went to pt room to see if pt was ready to be placed on cpap, pt wasn't. RT made RN aware.

## 2022-10-29 ENCOUNTER — Inpatient Hospital Stay (HOSPITAL_COMMUNITY): Payer: Medicare Other

## 2022-10-29 DIAGNOSIS — J101 Influenza due to other identified influenza virus with other respiratory manifestations: Secondary | ICD-10-CM | POA: Diagnosis not present

## 2022-10-29 LAB — BASIC METABOLIC PANEL
Anion gap: 15 (ref 5–15)
BUN: 20 mg/dL (ref 6–20)
CO2: 30 mmol/L (ref 22–32)
Calcium: 8.9 mg/dL (ref 8.9–10.3)
Chloride: 95 mmol/L — ABNORMAL LOW (ref 98–111)
Creatinine, Ser: 1.26 mg/dL — ABNORMAL HIGH (ref 0.44–1.00)
GFR, Estimated: 50 mL/min — ABNORMAL LOW (ref >60)
Glucose, Bld: 101 mg/dL — ABNORMAL HIGH (ref 70–99)
Potassium: 4.1 mmol/L (ref 3.5–5.1)
Sodium: 140 mmol/L (ref 135–145)

## 2022-10-29 LAB — MAGNESIUM: Magnesium: 1.6 mg/dL — ABNORMAL LOW (ref 1.7–2.4)

## 2022-10-29 MED ORDER — MAGNESIUM SULFATE 2 GM/50ML IV SOLN
2.0000 g | Freq: Once | INTRAVENOUS | Status: AC
  Administered 2022-10-29: 2 g via INTRAVENOUS
  Filled 2022-10-29: qty 50

## 2022-10-29 NOTE — Progress Notes (Signed)
PROGRESS NOTE    [name]  MHD:[id number] DOB: 1966-01-27 DOA: 10/26/2022 PCP: [name], NP   Brief Narrative: 56 year old with past medical history significant of chronic diastolic heart failure, OHS, OSA, on CPAP, COPD on 2 L of oxygen, hypertension, hypothyroidism, sarcoidosis morbid obesity BMI 77 presented with shortness of breath.  She has a sick contact her son was positive for COVID and flu.  She presented with cough, myalgia, shortness of breath.  Evaluation in the ED she was found to be positive for influenza A.    Assessment & Plan:   Principal Problem:   Influenza A Active Problems:   OSA (obstructive sleep apnea)   Obesity hypoventilation syndrome (HCC)   Hypertension   Morbid obesity (HCC)   Hyperlipidemia   Diastolic CHF ([place])   Sarcoidosis of lung ([place])  1-Influenza A: Supportive care Continue with Tamiflu Continue with  Duo-neb.    Acute Chronic diastolic heart failure She is more hypoxic on exertion.  Continue with  40 mg IV lasix BID.  Strict I and O.  Monitor renal function.  Negative 3.8 L.  Repeat chest x ray. Cardiomegaly, mild edema  OSA/OHA: Acute on chronic Hypoxic Resp failure.  Continue CPAP at bedtime Continue with chronic 2 L of oxygen Might need higher amount of oxygen on exertion.  IV lasix.   Hypertension: Continue with Toprol Holding losartan   Hypokalemia; replete orally.   Hyperlipidemia: Continue with atorvastatin  Sarcoidosis: On weekly methotrexate and daily prednisone  Stage IIIa CKD: Creatinine range 1.3--1.5 Monitor on IV lasix.   Super morbid obesity BMI: 77 Needs life style modification,.       Estimated body mass index is 77.57 kg/m as calculated from the following:   Height as of this encounter: '5\' 6"'$  (1.676 m).   Weight as of this encounter: 218 kg.   DVT prophylaxis: Lovenox Code Status: Full code Family Communication: Care discussed with patient.  Disposition Plan:  Status  is: Observation The patient remains OBS appropriate and will d/c before 2 midnights.    Consultants:  none  Procedures:  none  Antimicrobials:    Subjective: Report some improvement of dyspnea, still coughing. Desat on ambulation.      Objective: Vitals:   10/29/22 0540 10/29/22 0904 10/29/22 0909 10/29/22 1000  BP: (!) 141/112 (!) 146/73 (!) 146/73   Pulse: 79 (!) 103 (!) 103   Resp: 18  17   Temp: 97.6 F (36.4 C)  98 F (36.7 C)   TempSrc: Oral     SpO2: 99%  95%   Weight:    (!) 218 kg  Height:    '5\' 6"'$  (1.676 m)    Intake/Output Summary (Last 24 hours) at 10/29/2022 1429 Last data filed at 10/29/2022 0933 Gross per 24 hour  Intake 360 ml  Output 1350 ml  Net -990 ml    Filed Weights   10/26/22 0531 10/29/22 1000  Weight: (!) 218 kg (!) 218 kg    Examination:  General exam: NAD Respiratory system: BL crackles.  Cardiovascular system: s 1. S 2 RRR Gastrointestinal system: BS present, soft, nt Central nervous system: alert, follows command Extremities: Symmetric power.     Data Reviewed: I have personally reviewed following labs and imaging studies  CBC: Recent Labs  Lab 10/26/22 0600 10/26/22 0635 10/27/22 0942  WBC 5.4  --  4.5  HGB 11.0* 12.9 10.6*  HCT 38.3 38.0 34.7*  MCV 78.8*  --  76.9*  PLT 200  --  660    Basic Metabolic Panel: Recent Labs  Lab 10/26/22 0600 10/26/22 0635 10/27/22 0640 10/28/22 0635 10/29/22 0808  NA 137 139 139 139 140  K 3.6 3.7 3.6 3.3* 4.1  CL 96*  --  101 96* 95*  CO2 30  --  28 33* 30  GLUCOSE 117*  --  85 86 101*  BUN 12  --  '16 17 20  '$ CREATININE 1.12*  --  1.08* 1.13* 1.26*  CALCIUM 8.6*  --  8.2* 8.3* 8.9  MG  --   --   --   --  1.6*    GFR: Estimated Creatinine Clearance: 95.5 mL/min (A) (by C-G formula based on SCr of 1.26 mg/dL (H)). Liver Function Tests: Recent Labs  Lab 10/26/22 0600  AST 17  ALT 11  ALKPHOS 47  BILITOT 0.7  PROT 7.6  ALBUMIN 3.7    No results for  input(s): "LIPASE", "AMYLASE" in the last 168 hours. No results for input(s): "AMMONIA" in the last 168 hours. Coagulation Profile: No results for input(s): "INR", "PROTIME" in the last 168 hours. Cardiac Enzymes: No results for input(s): "CKTOTAL", "CKMB", "CKMBINDEX", "TROPONINI" in the last 168 hours. BNP (last 3 results) No results for input(s): "PROBNP" in the last 8760 hours. HbA1C: No results for input(s): "HGBA1C" in the last 72 hours. CBG: No results for input(s): "GLUCAP" in the last 168 hours. Lipid Profile: No results for input(s): "CHOL", "HDL", "LDLCALC", "TRIG", "CHOLHDL", "LDLDIRECT" in the last 72 hours. Thyroid Function Tests: No results for input(s): "TSH", "T4TOTAL", "FREET4", "T3FREE", "THYROIDAB" in the last 72 hours. Anemia Panel: No results for input(s): "VITAMINB12", "FOLATE", "FERRITIN", "TIBC", "IRON", "RETICCTPCT" in the last 72 hours. Sepsis Labs: No results for input(s): "PROCALCITON", "LATICACIDVEN" in the last 168 hours.  Recent Results (from the past 240 hour(s))  Resp panel by RT-PCR (RSV, Flu A&B, Covid) Anterior Nasal Swab     Status: Abnormal   Collection Time: 10/26/22  6:00 AM   Specimen: Anterior Nasal Swab  Result Value Ref Range Status   SARS Coronavirus 2 by RT PCR NEGATIVE NEGATIVE Final    Comment: (NOTE) SARS-CoV-2 target nucleic acids are NOT DETECTED.  The SARS-CoV-2 RNA is generally detectable in upper respiratory specimens during the acute phase of infection. The lowest concentration of SARS-CoV-2 viral copies this assay can detect is 138 copies/mL. A negative result does not preclude SARS-Cov-2 infection and should not be used as the sole basis for treatment or other patient management decisions. A negative result may occur with  improper specimen collection/handling, submission of specimen other than nasopharyngeal swab, presence of viral mutation(s) within the areas targeted by this assay, and inadequate number of  viral copies(<138 copies/mL). A negative result must be combined with clinical observations, patient history, and epidemiological information. The expected result is Negative.  Fact Sheet for Patients:  EntrepreneurPulse.com.au  Fact Sheet for Healthcare Providers:  IncredibleEmployment.be  This test is no t yet approved or cleared by the [place] FDA and  has been authorized for detection and/or diagnosis of SARS-CoV-2 by FDA under an Emergency Use Authorization (EUA). This EUA will remain  in effect (meaning this test can be used) for the duration of the COVID-19 declaration under Section 564(b)(1) of the Act, 21 U.S.C.section 360bbb-3(b)(1), unless the authorization is terminated  or revoked sooner.       Influenza A by PCR POSITIVE (A) NEGATIVE Final   Influenza B by PCR NEGATIVE NEGATIVE Final    Comment: (NOTE) The Xpert  Xpress SARS-CoV-2/FLU/RSV plus assay is intended as an aid in the diagnosis of influenza from Nasopharyngeal swab specimens and should not be used as a sole basis for treatment. Nasal washings and aspirates are unacceptable for Xpert Xpress SARS-CoV-2/FLU/RSV testing.  Fact Sheet for Patients: EntrepreneurPulse.com.au  Fact Sheet for Healthcare Providers: IncredibleEmployment.be  This test is not yet approved or cleared by the [place] FDA and has been authorized for detection and/or diagnosis of SARS-CoV-2 by FDA under an Emergency Use Authorization (EUA). This EUA will remain in effect (meaning this test can be used) for the duration of the COVID-19 declaration under Section 564(b)(1) of the Act, 21 U.S.C. section 360bbb-3(b)(1), unless the authorization is terminated or revoked.     Resp Syncytial Virus by PCR NEGATIVE NEGATIVE Final    Comment: (NOTE) Fact Sheet for Patients: EntrepreneurPulse.com.au  Fact Sheet for Healthcare  Providers: IncredibleEmployment.be  This test is not yet approved or cleared by the [place] FDA and has been authorized for detection and/or diagnosis of SARS-CoV-2 by FDA under an Emergency Use Authorization (EUA). This EUA will remain in effect (meaning this test can be used) for the duration of the COVID-19 declaration under Section 564(b)(1) of the Act, 21 U.S.C. section 360bbb-3(b)(1), unless the authorization is terminated or revoked.  Performed at [facility], [place] [address] [place], [place] [address]          Radiology Studies: DG CHEST PORT 1 VIEW  Result Date: 10/29/2022 CLINICAL DATA:  Hypoxia EXAM: PORTABLE CHEST 1 VIEW COMPARISON:  Chest x-ray October 26, 2022 FINDINGS: Stable cardiomegaly. The hila and mediastinum are unchanged. No pneumothorax. No nodules or masses. Interstitial prominence suggestive of mild edema, increased in the interval. An infectious process is considered less likely. Recommend clinical correlation. IMPRESSION: Cardiomegaly and probable mild edema. An infectious process is considered less likely. No other changes. Electronically Signed   By: Dorise Bullion III M.D.   On: 10/29/2022 11:19        Scheduled Meds:  atorvastatin  40 mg Oral q1800   calcium citrate  200 mg of elemental calcium Oral Daily   And   cholecalciferol  400 Units Oral Daily   docusate sodium  100 mg Oral BID   enoxaparin (LOVENOX) injection  40 mg Subcutaneous Q24H   famotidine  40 mg Oral BID   folic acid  1 mg Oral Daily   furosemide  40 mg Intravenous BID   ipratropium-albuterol  3 mL Nebulization BID   metoprolol succinate  25 mg Oral Daily   oseltamivir  75 mg Oral BID   predniSONE  10 mg Oral Daily   sodium chloride flush  3 mL Intravenous Q12H   triamcinolone ointment  1 Application Topical BID   Continuous Infusions:   LOS: 2 days    Time spent: 35 minutes    Demian Maisel A Yumalay Circle, MD [facility]   If  7PM-7AM, please contact night-coverage [email or web address]  10/29/2022, 2:29 PM

## 2022-10-30 DIAGNOSIS — J101 Influenza due to other identified influenza virus with other respiratory manifestations: Secondary | ICD-10-CM | POA: Diagnosis not present

## 2022-10-30 LAB — BASIC METABOLIC PANEL
Anion gap: 8 (ref 5–15)
BUN: 21 mg/dL — ABNORMAL HIGH (ref 6–20)
CO2: 34 mmol/L — ABNORMAL HIGH (ref 22–32)
Calcium: 8.5 mg/dL — ABNORMAL LOW (ref 8.9–10.3)
Chloride: 94 mmol/L — ABNORMAL LOW (ref 98–111)
Creatinine, Ser: 1.02 mg/dL — ABNORMAL HIGH (ref 0.44–1.00)
GFR, Estimated: >60 mL/min (ref >60)
Glucose, Bld: 92 mg/dL (ref 70–99)
Potassium: 3.8 mmol/L (ref 3.5–5.1)
Sodium: 136 mmol/L (ref 135–145)

## 2022-10-30 LAB — MAGNESIUM: Magnesium: 1.7 mg/dL (ref 1.7–2.4)

## 2022-10-30 MED ORDER — MAGNESIUM SULFATE 2 GM/50ML IV SOLN
2.0000 g | Freq: Once | INTRAVENOUS | Status: AC
  Administered 2022-10-30: 2 g via INTRAVENOUS
  Filled 2022-10-30: qty 50

## 2022-10-30 MED ORDER — POTASSIUM CHLORIDE CRYS ER 20 MEQ PO TBCR
40.0000 meq | EXTENDED_RELEASE_TABLET | Freq: Once | ORAL | Status: AC
  Administered 2022-10-30: 40 meq via ORAL
  Filled 2022-10-30: qty 2

## 2022-10-30 MED ORDER — AZITHROMYCIN 250 MG PO TABS
500.0000 mg | ORAL_TABLET | Freq: Every day | ORAL | Status: DC
  Administered 2022-10-30 – 2022-11-01 (×3): 500 mg via ORAL
  Filled 2022-10-30 (×3): qty 2

## 2022-10-30 MED ORDER — ENOXAPARIN SODIUM 100 MG/ML IJ SOSY
100.0000 mg | PREFILLED_SYRINGE | INTRAMUSCULAR | Status: DC
  Administered 2022-10-31: 100 mg via SUBCUTANEOUS
  Filled 2022-10-30 (×2): qty 1

## 2022-10-30 NOTE — Progress Notes (Signed)
PROGRESS NOTE    [name]  UMP:[id number] DOB: [birth year] DOA: 10/26/2022 PCP: [name], NP   Brief Narrative: 56 year old with past medical history significant of chronic diastolic heart failure, OHS, OSA, on CPAP, COPD on 2 L of oxygen, hypertension, hypothyroidism, sarcoidosis morbid obesity BMI 77 presented with shortness of breath.  She has a sick contact her son was positive for COVID and flu.  She presented with cough, myalgia, shortness of breath.  Evaluation in the ED she was found to be positive for influenza A.    Assessment & Plan:   Principal Problem:   Influenza A Active Problems:   OSA (obstructive sleep apnea)   Obesity hypoventilation syndrome (HCC)   Hypertension   Morbid obesity (HCC)   Hyperlipidemia   Diastolic CHF ([place])   Sarcoidosis of lung ([place])  1-Influenza A: Supportive care Continue with Tamiflu Continue with  Duo-neb.  Will add azithro.   Acute Chronic diastolic heart failure She is more hypoxic on exertion.  Continue with  40 mg IV lasix BID.  Strict I and O.  Monitor renal function.  Negative 5 L.  Repeat chest x ray. Cardiomegaly, mild edema Plan to continue IV lasix for one more day.   OSA/OHA: Acute on chronic Hypoxic Resp failure.  Continue CPAP at bedtime Continue with chronic 2 L of oxygen Might need higher amount of oxygen on exertion.  IV lasix.   Hypertension: Continue with Toprol Holding losartan   Hypokalemia; replete orally.   Hyperlipidemia: Continue with atorvastatin  Sarcoidosis: On weekly methotrexate and daily prednisone  Stage IIIa CKD: Creatinine range 1.3--1.5 Stable on lasix.   Super morbid obesity BMI: 77 Needs life style modification,.       Estimated body mass index is 77.57 kg/m as calculated from the following:   Height as of this encounter: '5\' 6"'$  (1.676 m).   Weight as of this encounter: 218 kg.   DVT prophylaxis: Lovenox Code Status: Full code Family Communication:  Care discussed with patient.  Disposition Plan:  Status is: Observation The patient remains OBS appropriate and will d/c before 2 midnights.    Consultants:  none  Procedures:  none  Antimicrobials:    Subjective: Still SOB on exertion, report cough.    Objective: Vitals:   10/29/22 2343 10/30/22 0416 10/30/22 0829 10/30/22 0924  BP:  (!) 149/71 (!) 152/71   Pulse: 74 68 69   Resp: '20 19 20   '$ Temp:  98.1 F (36.7 C) 97.9 F (36.6 C)   TempSrc:  Oral Oral   SpO2: 95% 100% 98% 96%  Weight:      Height:        Intake/Output Summary (Last 24 hours) at 10/30/2022 1355 Last data filed at 10/30/2022 1300 Gross per 24 hour  Intake --  Output 1450 ml  Net -1450 ml    Filed Weights   10/26/22 0531 10/29/22 1000  Weight: (!) 218 kg (!) 218 kg    Examination:  General exam: NAD Respiratory system:BL crackles.  Cardiovascular system: S 1, S 2 RRR Gastrointestinal system: BS present, soft, nt Central nervous system: Alert, follows command Extremities: Sno edema    Data Reviewed: I have personally reviewed following labs and imaging studies  CBC: Recent Labs  Lab 10/26/22 0600 10/26/22 0635 10/27/22 0942  WBC 5.4  --  4.5  HGB 11.0* 12.9 10.6*  HCT 38.3 38.0 34.7*  MCV 78.8*  --  76.9*  PLT 200  --  200  Basic Metabolic Panel: Recent Labs  Lab 10/26/22 0600 10/26/22 0635 10/27/22 0640 10/28/22 0635 10/29/22 0808 10/30/22 0844  NA 137 139 139 139 140 136  K 3.6 3.7 3.6 3.3* 4.1 3.8  CL 96*  --  101 96* 95* 94*  CO2 30  --  28 33* 30 34*  GLUCOSE 117*  --  85 86 101* 92  BUN 12  --  '16 17 20 '$ 21*  CREATININE 1.12*  --  1.08* 1.13* 1.26* 1.02*  CALCIUM 8.6*  --  8.2* 8.3* 8.9 8.5*  MG  --   --   --   --  1.6* 1.7    GFR: Estimated Creatinine Clearance: 118 mL/min (A) (by C-G formula based on SCr of 1.02 mg/dL (H)). Liver Function Tests: Recent Labs  Lab 10/26/22 0600  AST 17  ALT 11  ALKPHOS 47  BILITOT 0.7  PROT 7.6  ALBUMIN 3.7     No results for input(s): "LIPASE", "AMYLASE" in the last 168 hours. No results for input(s): "AMMONIA" in the last 168 hours. Coagulation Profile: No results for input(s): "INR", "PROTIME" in the last 168 hours. Cardiac Enzymes: No results for input(s): "CKTOTAL", "CKMB", "CKMBINDEX", "TROPONINI" in the last 168 hours. BNP (last 3 results) No results for input(s): "PROBNP" in the last 8760 hours. HbA1C: No results for input(s): "HGBA1C" in the last 72 hours. CBG: No results for input(s): "GLUCAP" in the last 168 hours. Lipid Profile: No results for input(s): "CHOL", "HDL", "LDLCALC", "TRIG", "CHOLHDL", "LDLDIRECT" in the last 72 hours. Thyroid Function Tests: No results for input(s): "TSH", "T4TOTAL", "FREET4", "T3FREE", "THYROIDAB" in the last 72 hours. Anemia Panel: No results for input(s): "VITAMINB12", "FOLATE", "FERRITIN", "TIBC", "IRON", "RETICCTPCT" in the last 72 hours. Sepsis Labs: No results for input(s): "PROCALCITON", "LATICACIDVEN" in the last 168 hours.  Recent Results (from the past 240 hour(s))  Resp panel by RT-PCR (RSV, Flu A&B, Covid) Anterior Nasal Swab     Status: Abnormal   Collection Time: 10/26/22  6:00 AM   Specimen: Anterior Nasal Swab  Result Value Ref Range Status   SARS Coronavirus 2 by RT PCR NEGATIVE NEGATIVE Final    Comment: (NOTE) SARS-CoV-2 target nucleic acids are NOT DETECTED.  The SARS-CoV-2 RNA is generally detectable in upper respiratory specimens during the acute phase of infection. The lowest concentration of SARS-CoV-2 viral copies this assay can detect is 138 copies/mL. A negative result does not preclude SARS-Cov-2 infection and should not be used as the sole basis for treatment or other patient management decisions. A negative result may occur with  improper specimen collection/handling, submission of specimen other than nasopharyngeal swab, presence of viral mutation(s) within the areas targeted by this assay, and inadequate  number of viral copies(<138 copies/mL). A negative result must be combined with clinical observations, patient history, and epidemiological information. The expected result is Negative.  Fact Sheet for Patients:  EntrepreneurPulse.com.au  Fact Sheet for Healthcare Providers:  IncredibleEmployment.be  This test is no t yet approved or cleared by the [place] FDA and  has been authorized for detection and/or diagnosis of SARS-CoV-2 by FDA under an Emergency Use Authorization (EUA). This EUA will remain  in effect (meaning this test can be used) for the duration of the COVID-19 declaration under Section 564(b)(1) of the Act, 21 U.S.C.section 360bbb-3(b)(1), unless the authorization is terminated  or revoked sooner.       Influenza A by PCR POSITIVE (A) NEGATIVE Final   Influenza B by PCR NEGATIVE NEGATIVE Final  Comment: (NOTE) The Xpert Xpress SARS-CoV-2/FLU/RSV plus assay is intended as an aid in the diagnosis of influenza from Nasopharyngeal swab specimens and should not be used as a sole basis for treatment. Nasal washings and aspirates are unacceptable for Xpert Xpress SARS-CoV-2/FLU/RSV testing.  Fact Sheet for Patients: EntrepreneurPulse.com.au  Fact Sheet for Healthcare Providers: IncredibleEmployment.be  This test is not yet approved or cleared by the [place] FDA and has been authorized for detection and/or diagnosis of SARS-CoV-2 by FDA under an Emergency Use Authorization (EUA). This EUA will remain in effect (meaning this test can be used) for the duration of the COVID-19 declaration under Section 564(b)(1) of the Act, 21 U.S.C. section 360bbb-3(b)(1), unless the authorization is terminated or revoked.     Resp Syncytial Virus by PCR NEGATIVE NEGATIVE Final    Comment: (NOTE) Fact Sheet for Patients: EntrepreneurPulse.com.au  Fact Sheet for Healthcare  Providers: IncredibleEmployment.be  This test is not yet approved or cleared by the [place] FDA and has been authorized for detection and/or diagnosis of SARS-CoV-2 by FDA under an Emergency Use Authorization (EUA). This EUA will remain in effect (meaning this test can be used) for the duration of the COVID-19 declaration under Section 564(b)(1) of the Act, 21 U.S.C. section 360bbb-3(b)(1), unless the authorization is terminated or revoked.  Performed at [facility], [place] [address], [place], [place] [address]          Radiology Studies: DG CHEST PORT 1 VIEW  Result Date: 10/29/2022 CLINICAL DATA:  Hypoxia EXAM: PORTABLE CHEST 1 VIEW COMPARISON:  Chest x-ray October 26, 2022 FINDINGS: Stable cardiomegaly. The hila and mediastinum are unchanged. No pneumothorax. No nodules or masses. Interstitial prominence suggestive of mild edema, increased in the interval. An infectious process is considered less likely. Recommend clinical correlation. IMPRESSION: Cardiomegaly and probable mild edema. An infectious process is considered less likely. No other changes. Electronically Signed   By: Dorise Bullion III M.D.   On: 10/29/2022 11:19        Scheduled Meds:  atorvastatin  40 mg Oral q1800   azithromycin  500 mg Oral Daily   calcium citrate  200 mg of elemental calcium Oral Daily   And   cholecalciferol  400 Units Oral Daily   docusate sodium  100 mg Oral BID   enoxaparin (LOVENOX) injection  40 mg Subcutaneous Q24H   famotidine  40 mg Oral BID   folic acid  1 mg Oral Daily   furosemide  40 mg Intravenous BID   ipratropium-albuterol  3 mL Nebulization BID   metoprolol succinate  25 mg Oral Daily   oseltamivir  75 mg Oral BID   predniSONE  10 mg Oral Daily   sodium chloride flush  3 mL Intravenous Q12H   triamcinolone ointment  1 Application Topical BID   Continuous Infusions:   LOS: 3 days    Time spent: 35 minutes    Dagoberto Nealy A Danthony Kendrix,  MD [facility]   If 7PM-7AM, please contact night-coverage [email or web address]  10/30/2022, 1:55 PM

## 2022-10-30 NOTE — Progress Notes (Signed)
Patient stated she could place herself on CPAP for the night

## 2022-10-30 NOTE — Progress Notes (Signed)
Room air ambulating in room , O2 sats dropped to 87%.  Pt respiration 25,  and dyspnea.  Placed on 2 liters O2 sats 92-94 when up on feet for just one minute.  Pt did not tolerated ambulating well. O2 sats 94 on 2 liters nasal cannula.

## 2022-10-30 NOTE — Plan of Care (Signed)
  Problem: Clinical Measurements: Goal: Will remain free from infection Outcome: Progressing Goal: Respiratory complications will improve Outcome: Progressing   Problem: Activity: Goal: Risk for activity intolerance will decrease Outcome: Progressing   Problem: Nutrition: Goal: Adequate nutrition will be maintained Outcome: Progressing   Problem: Coping: Goal: Level of anxiety will decrease Outcome: Progressing   Problem: Safety: Goal: Ability to remain free from injury will improve Outcome: Progressing   Problem: Skin Integrity: Goal: Risk for impaired skin integrity will decrease Outcome: Progressing

## 2022-10-30 NOTE — Progress Notes (Signed)
Mobility Specialist Progress Note   10/30/22 1707  Mobility  Activity Ambulated with assistance in room  Level of Assistance Standby assist, set-up cues, supervision of patient - no hands on  Assistive Device None  Distance Ambulated (ft) 32 ft  Activity Response Tolerated well  $Mobility charge 1 Mobility   Pre Mobility: 74 HR, 95% SpO2 on 2LO2 During Mobility: 124 HR, 82% SpO2 on 3LO2  Post Mobility: 92 HR, 92% SpO2 2LO2  Received in bed having no complaints and agreeable. Ambulated in room w/ antalgic like gait d/t R knee pain but no faults during. Educated on potential and importance of an AD and pt not opposed to using a cane but thinks a RW is cumbersome. Post mobility pt desat to 82% SpO2 at 2LO2, after no change from PLB pt required 3LO2 to gradually rise to 92% SpO2, per advisement of RN. Once SpO2 stable weaned back down to 2LO2. Left sitting EOB w/ call bell in reach and needs met.   Holland Falling Mobility Specialist Please contact via SecureChat or  Rehab office at [phone]

## 2022-10-31 DIAGNOSIS — J101 Influenza due to other identified influenza virus with other respiratory manifestations: Secondary | ICD-10-CM | POA: Diagnosis not present

## 2022-10-31 LAB — BASIC METABOLIC PANEL
Anion gap: 18 — ABNORMAL HIGH (ref 5–15)
Anion gap: 11 (ref 5–15)
BUN: 25 mg/dL — ABNORMAL HIGH (ref 6–20)
BUN: 23 mg/dL — ABNORMAL HIGH (ref 6–20)
CO2: 27 mmol/L (ref 22–32)
CO2: 34 mmol/L — ABNORMAL HIGH (ref 22–32)
Calcium: 9.3 mg/dL (ref 8.9–10.3)
Calcium: 9 mg/dL (ref 8.9–10.3)
Chloride: 92 mmol/L — ABNORMAL LOW (ref 98–111)
Chloride: 94 mmol/L — ABNORMAL LOW (ref 98–111)
Creatinine, Ser: 1.34 mg/dL — ABNORMAL HIGH (ref 0.44–1.00)
Creatinine, Ser: 1.09 mg/dL — ABNORMAL HIGH (ref 0.44–1.00)
GFR, Estimated: 46 mL/min — ABNORMAL LOW (ref >60)
GFR, Estimated: 59 mL/min — ABNORMAL LOW (ref >60)
Glucose, Bld: 120 mg/dL — ABNORMAL HIGH (ref 70–99)
Glucose, Bld: 91 mg/dL (ref 70–99)
Potassium: 4.2 mmol/L (ref 3.5–5.1)
Potassium: 3.7 mmol/L (ref 3.5–5.1)
Sodium: 137 mmol/L (ref 135–145)
Sodium: 139 mmol/L (ref 135–145)

## 2022-10-31 MED ORDER — TORSEMIDE 20 MG PO TABS
20.0000 mg | ORAL_TABLET | Freq: Every day | ORAL | Status: DC
  Administered 2022-10-31: 20 mg via ORAL
  Filled 2022-10-31 (×3): qty 1

## 2022-10-31 MED ORDER — AZITHROMYCIN 500 MG PO TABS: 500.0000 mg | ORAL_TABLET | Freq: Every day | ORAL | 0 refills | Status: DC

## 2022-10-31 MED ORDER — GUAIFENESIN ER 600 MG PO TB12: 600.0000 mg | ORAL_TABLET | Freq: Two times a day (BID) | ORAL | 0 refills | Status: DC | PRN

## 2022-10-31 NOTE — TOC Progression Note (Signed)
Transition of Care (TOC) - Progression Note   Patient has home oxygen with [employer] and has portable tank.    Provided Erasmo Downer with [employer] updated orders for home oxygen   Tub bench was ordered 10/28/22  Patient Details  Name: [name] MRN: [id number] Date of Birth: 04-23-1966  Transition of Care [facility]) CM/SW Contact  Cheralyn Oliver, Edson Snowball, RN Phone Number: 10/31/2022, 9:26 AM  Clinical Narrative:            Expected Discharge Plan and Services                                               Social Determinants of Health (SDOH) Interventions SDOH Screenings   Food Insecurity: No Food Insecurity (11/22/2021)  Housing: Low Risk  (11/22/2021)  Transportation Needs: No Transportation Needs (11/22/2021)  Alcohol Screen: Low Risk  (12/04/2020)  Depression (PHQ2-9): Low Risk  (11/22/2021)  Financial Resource Strain: Low Risk  (11/22/2021)  Physical Activity: Inactive (11/22/2021)  Social Connections: Moderately Isolated (11/22/2021)  Stress: No Stress Concern Present (11/22/2021)  Tobacco Use: Low Risk  (10/26/2022)    Readmission Risk Interventions     No data to display

## 2022-10-31 NOTE — Progress Notes (Signed)
PROGRESS NOTE    [name]  VFI:[id number] DOB: [birth year] DOA: 10/26/2022 PCP: [name], NP   Brief Narrative: 57 year old with past medical history significant of chronic diastolic heart failure, OHS, OSA, on CPAP, COPD on 2 L of oxygen, hypertension, hypothyroidism, sarcoidosis morbid obesity BMI 77 presented with shortness of breath.  She has a sick contact her son was positive for COVID and flu.  She presented with cough, myalgia, shortness of breath.  Evaluation in the ED she was found to be positive for influenza A.    Assessment & Plan:   Principal Problem:   Influenza A Active Problems:   OSA (obstructive sleep apnea)   Obesity hypoventilation syndrome (HCC)   Hypertension   Morbid obesity (HCC)   Hyperlipidemia   Diastolic CHF ([place])   Sarcoidosis of lung ([place])  1-Influenza A: Supportive care Completed Tamiflu Continue with  Duo-neb.  Continue with azithro for 5 days.   Acute Chronic diastolic heart failure She is more hypoxic on exertion.  Continue with  40 mg IV lasix BID.  Strict I and O.  Monitor renal function.  Negative 5 L.  Repeat chest x ray. Cardiomegaly, mild edema Transition to torsemide today.   OSA/OHA: Acute on chronic Hypoxic Resp failure.  Continue CPAP at bedtime Continue with chronic 2 L of oxygen Might need higher amount of oxygen on exertion.    Hypertension: Continue with Toprol Holding losartan   Hypokalemia; Replaced.   Hyperlipidemia: Continue with atorvastatin  Sarcoidosis: On weekly methotrexate and daily prednisone  Stage IIIa CKD: Creatinine range 1.3--1.5 Stable on lasix.   Super morbid obesity BMI: 77 Needs life style modification,.   Nausea; report episode of nausea and diaphoresis. Cramping abdominal pain. Plan to  observed      Estimated body mass index is 77.57 kg/m as calculated from the following:   Height as of this encounter: '5\' 6"'$  (1.676 m).   Weight as of this encounter: 218  kg.   DVT prophylaxis: Lovenox Code Status: Full code Family Communication: Care discussed with patient.  Disposition Plan:  Status is: Observation The patient remains OBS appropriate and will d/c before 2 midnights.    Consultants:  none  Procedures:  none  Antimicrobials:    Subjective: Report dyspnea on exertion has improved. Oxygen usually drops on exertion even at home. Her time for recovery is fast.  Report episode of nausea and diaphoresis. Cramping pain.   Objective: Vitals:   10/30/22 1604 10/30/22 2139 10/31/22 0500 10/31/22 1012  BP: 111/61 130/64 136/69 137/61  Pulse: 72 69 85 70  Resp: '17 17 18 18  '$ Temp: 98.4 F (36.9 C) 98.2 F (36.8 C) 98 F (36.7 C) 98.4 F (36.9 C)  TempSrc: Oral Oral Oral Oral  SpO2: 100% 100% 99% 98%  Weight:      Height:        Intake/Output Summary (Last 24 hours) at 10/31/2022 1329 Last data filed at 10/31/2022 1015 Gross per 24 hour  Intake 480 ml  Output 850 ml  Net -370 ml    Filed Weights   10/26/22 0531 10/29/22 1000  Weight: (!) 218 kg (!) 218 kg    Examination:  General exam: NAD Respiratory system; CTA Cardiovascular system:  S,1  S 2 RRR Gastrointestinal system: BS present, soft nt Central nervous system: Alert, follows command Extremities: no edema    Data Reviewed: I have personally reviewed following labs and imaging studies  CBC: Recent Labs  Lab 10/26/22 0600 10/26/22 4166  10/27/22 0942  WBC 5.4  --  4.5  HGB 11.0* 12.9 10.6*  HCT 38.3 38.0 34.7*  MCV 78.8*  --  76.9*  PLT 200  --  867    Basic Metabolic Panel: Recent Labs  Lab 10/28/22 0635 10/29/22 0808 10/30/22 0844 10/31/22 0234 10/31/22 0831  NA 139 140 136 137 139  K 3.3* 4.1 3.8 4.2 3.7  CL 96* 95* 94* 92* 94*  CO2 33* 30 34* 27 34*  GLUCOSE 86 101* 92 120* 91  BUN 17 20 21* 25* 23*  CREATININE 1.13* 1.26* 1.02* 1.34* 1.09*  CALCIUM 8.3* 8.9 8.5* 9.3 9.0  MG  --  1.6* 1.7  --   --     GFR: Estimated Creatinine  Clearance: 110.4 mL/min (A) (by C-G formula based on SCr of 1.09 mg/dL (H)). Liver Function Tests: Recent Labs  Lab 10/26/22 0600  AST 17  ALT 11  ALKPHOS 47  BILITOT 0.7  PROT 7.6  ALBUMIN 3.7    No results for input(s): "LIPASE", "AMYLASE" in the last 168 hours. No results for input(s): "AMMONIA" in the last 168 hours. Coagulation Profile: No results for input(s): "INR", "PROTIME" in the last 168 hours. Cardiac Enzymes: No results for input(s): "CKTOTAL", "CKMB", "CKMBINDEX", "TROPONINI" in the last 168 hours. BNP (last 3 results) No results for input(s): "PROBNP" in the last 8760 hours. HbA1C: No results for input(s): "HGBA1C" in the last 72 hours. CBG: No results for input(s): "GLUCAP" in the last 168 hours. Lipid Profile: No results for input(s): "CHOL", "HDL", "LDLCALC", "TRIG", "CHOLHDL", "LDLDIRECT" in the last 72 hours. Thyroid Function Tests: No results for input(s): "TSH", "T4TOTAL", "FREET4", "T3FREE", "THYROIDAB" in the last 72 hours. Anemia Panel: No results for input(s): "VITAMINB12", "FOLATE", "FERRITIN", "TIBC", "IRON", "RETICCTPCT" in the last 72 hours. Sepsis Labs: No results for input(s): "PROCALCITON", "LATICACIDVEN" in the last 168 hours.  Recent Results (from the past 240 hour(s))  Resp panel by RT-PCR (RSV, Flu A&B, Covid) Anterior Nasal Swab     Status: Abnormal   Collection Time: 10/26/22  6:00 AM   Specimen: Anterior Nasal Swab  Result Value Ref Range Status   SARS Coronavirus 2 by RT PCR NEGATIVE NEGATIVE Final    Comment: (NOTE) SARS-CoV-2 target nucleic acids are NOT DETECTED.  The SARS-CoV-2 RNA is generally detectable in upper respiratory specimens during the acute phase of infection. The lowest concentration of SARS-CoV-2 viral copies this assay can detect is 138 copies/mL. A negative result does not preclude SARS-Cov-2 infection and should not be used as the sole basis for treatment or other patient management decisions. A negative  result may occur with  improper specimen collection/handling, submission of specimen other than nasopharyngeal swab, presence of viral mutation(s) within the areas targeted by this assay, and inadequate number of viral copies(<138 copies/mL). A negative result must be combined with clinical observations, patient history, and epidemiological information. The expected result is Negative.  Fact Sheet for Patients:  EntrepreneurPulse.com.au  Fact Sheet for Healthcare Providers:  IncredibleEmployment.be  This test is no t yet approved or cleared by the [place] FDA and  has been authorized for detection and/or diagnosis of SARS-CoV-2 by FDA under an Emergency Use Authorization (EUA). This EUA will remain  in effect (meaning this test can be used) for the duration of the COVID-19 declaration under Section 564(b)(1) of the Act, 21 U.S.C.section 360bbb-3(b)(1), unless the authorization is terminated  or revoked sooner.       Influenza A by PCR POSITIVE (A)  NEGATIVE Final   Influenza B by PCR NEGATIVE NEGATIVE Final    Comment: (NOTE) The Xpert Xpress SARS-CoV-2/FLU/RSV plus assay is intended as an aid in the diagnosis of influenza from Nasopharyngeal swab specimens and should not be used as a sole basis for treatment. Nasal washings and aspirates are unacceptable for Xpert Xpress SARS-CoV-2/FLU/RSV testing.  Fact Sheet for Patients: EntrepreneurPulse.com.au  Fact Sheet for Healthcare Providers: IncredibleEmployment.be  This test is not yet approved or cleared by the [place] FDA and has been authorized for detection and/or diagnosis of SARS-CoV-2 by FDA under an Emergency Use Authorization (EUA). This EUA will remain in effect (meaning this test can be used) for the duration of the COVID-19 declaration under Section 564(b)(1) of the Act, 21 U.S.C. section 360bbb-3(b)(1), unless the authorization is  terminated or revoked.     Resp Syncytial Virus by PCR NEGATIVE NEGATIVE Final    Comment: (NOTE) Fact Sheet for Patients: EntrepreneurPulse.com.au  Fact Sheet for Healthcare Providers: IncredibleEmployment.be  This test is not yet approved or cleared by the [place] FDA and has been authorized for detection and/or diagnosis of SARS-CoV-2 by FDA under an Emergency Use Authorization (EUA). This EUA will remain in effect (meaning this test can be used) for the duration of the COVID-19 declaration under Section 564(b)(1) of the Act, 21 U.S.C. section 360bbb-3(b)(1), unless the authorization is terminated or revoked.  Performed at [facility], [place] [address] [place],  [address]          Radiology Studies: No results found.      Scheduled Meds:  atorvastatin  40 mg Oral q1800   azithromycin  500 mg Oral Daily   calcium citrate  200 mg of elemental calcium Oral Daily   And   cholecalciferol  400 Units Oral Daily   docusate sodium  100 mg Oral BID   enoxaparin (LOVENOX) injection  100 mg Subcutaneous Q24H   famotidine  40 mg Oral BID   folic acid  1 mg Oral Daily   metoprolol succinate  25 mg Oral Daily   predniSONE  10 mg Oral Daily   sodium chloride flush  3 mL Intravenous Q12H   torsemide  20 mg Oral Daily   triamcinolone ointment  1 Application Topical BID   Continuous Infusions:   LOS: 4 days    Time spent: 35 minutes    Amarise Lillo A Mekai Wilkinson, MD [facility]   If 7PM-7AM, please contact night-coverage [email or web address]  10/31/2022, 1:29 PM

## 2022-11-01 DIAGNOSIS — J101 Influenza due to other identified influenza virus with other respiratory manifestations: Secondary | ICD-10-CM | POA: Diagnosis not present

## 2022-11-01 NOTE — Progress Notes (Signed)
Discharge instructions reviewed with pt.   Copy of instructions given to pt. Pt informed scripts sent to her pharmacy for pick up.   Pt to be d/c'd via wheelchair with belongings, with family/friend picking her up at main entrance and will bring her O2 tank from home.  Pt to call unit desk when ride arrives at main entrance and staff will take her down on hospital O2 tank, and will switch over to her personal tank family bringing.       To be escorted by staff.   At 1315 pt's ride here, pt taken out in bariatric wheelchair with O2, taken also with her belongings.

## 2022-11-01 NOTE — Progress Notes (Signed)
Discharge completed by Adelfa Koh, SWOT RN. See nurse's note for details.

## 2022-11-01 NOTE — Discharge Summary (Signed)
Physician Discharge Summary   Patient: [name] MRN: [id number] DOB: 1966-10-29  Admit date:     10/26/2022  Discharge date: 11/01/22  Discharge Physician: Elmarie Shiley   PCP: [name], NP   Recommendations at discharge:   Encourage weight loss.  Monitor Volume status.  Follow up resolution of Flu  Discharge Diagnoses: Principal Problem:   Influenza A Active Problems:   OSA (obstructive sleep apnea)   Obesity hypoventilation syndrome (HCC)   Hypertension   Morbid obesity (HCC)   Hyperlipidemia   Diastolic CHF ([place])   Sarcoidosis of lung ([place])  Resolved Problems:   * No resolved hospital problems. *  Hospital Course: 57 year old with past medical history significant of chronic diastolic heart failure, OHS, OSA, on CPAP, COPD on 2 L of oxygen, hypertension, hypothyroidism, sarcoidosis morbid obesity BMI 77 presented with shortness of breath. She has a sick contact her son was positive for COVID and flu. She presented with cough, myalgia, shortness of breath. Evaluation in the ED she was found to be positive for influenza A.    Assessment and Plan: 1-Influenza A: Supportive care Completed Tamiflu Continue with  Duo-neb.  Continue with azithro for 5 days.    Acute Chronic diastolic heart failure She is more hypoxic on exertion.  Treated  with  40 mg IV lasix BID.  Strict I and O.  Monitor renal function.  Negative 6 L.  Repeat chest x ray. Cardiomegaly, mild edema Transition to torsemide   OSA/OHA: Acute on chronic Hypoxic Resp failure.  Continue CPAP at bedtime Continue with chronic 2 L of oxygen Might need higher amount of oxygen on exertion.      Hypertension: Continue with Toprol Holding losartan    Hypokalemia; Replaced.    Hyperlipidemia: Continue with atorvastatin   Sarcoidosis: On weekly methotrexate and daily prednisone   Stage IIIa CKD: Creatinine range 1.3--1.5 Resume Torsemide.    Super morbid obesity BMI:  77 Needs life style modification,.    Nausea; report episode of nausea and diaphoresis. Cramping abdominal pain. Plan to  observed  Resolved/  Stable for discharge today. \         Consultants: None Procedures performed: None Disposition: Home Diet recommendation:  Discharge Diet Orders (From admission, onward)     Start     Ordered   11/01/22 0000  Diet - low sodium heart healthy        11/01/22 1050   10/31/22 0000  Diet - low sodium heart healthy        10/31/22 1010           Cardiac diet DISCHARGE MEDICATION: Allergies as of 11/01/2022       Reactions   Tape Itching   EKG Leads cause Skin burns and irritation after 24 hours+        Medication List     STOP taking these medications    losartan 100 MG tablet Commonly known as: COZAAR       TAKE these medications    acetaminophen 500 MG tablet Commonly known as: TYLENOL Take 1 tablet (500 mg total) by mouth every 6 (six) hours as needed.   albuterol 108 (90 Base) MCG/ACT inhaler Commonly known as: VENTOLIN HFA INHALE 1-2 PUFFS BY MOUTH EVERY 6 HOURS AS NEEDED FOR WHEEZE OR SHORTNESS OF BREATH   atorvastatin 40 MG tablet Commonly known as: LIPITOR Take 1 tablet (40 mg total) by mouth daily at 6 PM.   azithromycin 500 MG tablet Commonly known as:  ZITHROMAX Take 1 tablet (500 mg total) by mouth daily.   calcium citrate-vitamin D 315-200 MG-UNIT tablet Commonly known as: CITRACAL+D Take 1 tablet by mouth daily.   cetirizine 10 MG tablet Commonly known as: ZYRTEC Take 10 mg by mouth at bedtime as needed for allergies.   cyanocobalamin 1000 MCG tablet Take 1 tablet (1,000 mcg total) by mouth daily.   cyclobenzaprine 10 MG tablet Commonly known as: FLEXERIL Take 1 tablet (10 mg total) by mouth 3 (three) times daily as needed for muscle spasms.   famotidine 40 MG tablet Commonly known as: PEPCID TAKE 1 TABLET BY MOUTH TWICE A DAY   folic acid 1 MG tablet Commonly known as:  FOLVITE Take 1 mg by mouth daily.   guaiFENesin 600 MG 12 hr tablet Commonly known as: MUCINEX Take 1 tablet (600 mg total) by mouth 2 (two) times daily as needed for cough.   HYDROcodone-acetaminophen 10-325 MG tablet Commonly known as: NORCO Take 1 tablet by mouth every 6 hours as needed What changed:  when to take this reasons to take this   methotrexate 50 MG/2ML injection Inject 20 mg into the vein once a week. Dose is 0.8 mL ('20mg'$ ). Take on Fridays   metoprolol succinate 25 MG 24 hr tablet Commonly known as: TOPROL-XL Take 1 tablet (25 mg total) by mouth daily.   NON FORMULARY 2 liter of oxygen   ondansetron 4 MG tablet Commonly known as: Zofran Take 1 tablet (4 mg total) by mouth every 8 (eight) hours as needed for nausea or vomiting.   predniSONE 10 MG tablet Commonly known as: DELTASONE Take 1 tablet (10 mg total) by mouth daily.   torsemide 20 MG tablet Commonly known as: DEMADEX Take 1 tablet (20 mg total) by mouth daily. Take two tablets ( 40 mg) for increase wt gain of 3 lbs in one day or 5 lbs in one week. Please make overdue appt with Dr. [name] before anymore refills. Thank you 2nd attempt   triamcinolone ointment 0.1 % Commonly known as: KENALOG Apply 1 application topically 2 (two) times daily. For Sarcoidosis               Durable Medical Equipment  (From admission, onward)           Start     Ordered   11/01/22 1042  For home use only DME Tub bench  Once       Comments: Bariatric Due to body habitus, weight is 462 pounds   11/01/22 1042   10/30/22 1359  For home use only DME oxygen  Once       Comments: Patient will need higher amount of oxygen on exertion.  Question Answer Comment  Length of Need Lifetime   Mode or (Route) Nasal cannula   Liters per Minute 4   Frequency Continuous (stationary and portable oxygen unit needed)   Oxygen delivery system Gas      10/30/22 1358            Follow-up Information     Nafziger,  Tommi Rumps, NP Follow up in 1 week(s).   Specialty: Family Medicine Contact information: [place] [phone]                Discharge Exam: Filed Weights   10/26/22 0531 10/29/22 1000 11/01/22 0418  Weight: (!) 218 kg (!) 218 kg (!) 210.1 kg   General; NAD  Condition at discharge: stable  The results of significant diagnostics  from this hospitalization (including imaging, microbiology, ancillary and laboratory) are listed below for reference.   Imaging Studies: DG CHEST PORT 1 VIEW  Result Date: 10/29/2022 CLINICAL DATA:  Hypoxia EXAM: PORTABLE CHEST 1 VIEW COMPARISON:  Chest x-ray October 26, 2022 FINDINGS: Stable cardiomegaly. The hila and mediastinum are unchanged. No pneumothorax. No nodules or masses. Interstitial prominence suggestive of mild edema, increased in the interval. An infectious process is considered less likely. Recommend clinical correlation. IMPRESSION: Cardiomegaly and probable mild edema. An infectious process is considered less likely. No other changes. Electronically Signed   By: Dorise Bullion III M.D.   On: 10/29/2022 11:19   DG Chest Port 1 View  Result Date: 10/26/2022 CLINICAL DATA:  [id number] with sharp chest pain. Shortness of breath also prep EXAM: PORTABLE CHEST 1 VIEW COMPARISON:  Portable chest 08/03/2021 FINDINGS: There is mild cardiomegaly. Central vessels are prominent but less prominent than previously. No interstitial edema seen or pleural effusions. The lungs are clear. The mediastinum is normally outlined. Thoracic spondylosis and degenerative disc disease. There are multiple overlying monitor wires. IMPRESSION: No acute chest findings. Mild cardiomegaly and central vascular prominence but less prominent than previously. No edema or focal airspace disease. Electronically Signed   By: Telford Nab M.D.   On: 10/26/2022 06:46    Microbiology: Results for orders placed or performed during the hospital  encounter of 10/26/22  Resp panel by RT-PCR (RSV, Flu A&B, Covid) Anterior Nasal Swab     Status: Abnormal   Collection Time: 10/26/22  6:00 AM   Specimen: Anterior Nasal Swab  Result Value Ref Range Status   SARS Coronavirus 2 by RT PCR NEGATIVE NEGATIVE Final    Comment: (NOTE) SARS-CoV-2 target nucleic acids are NOT DETECTED.  The SARS-CoV-2 RNA is generally detectable in upper respiratory specimens during the acute phase of infection. The lowest concentration of SARS-CoV-2 viral copies this assay can detect is 138 copies/mL. A negative result does not preclude SARS-Cov-2 infection and should not be used as the sole basis for treatment or other patient management decisions. A negative result may occur with  improper specimen collection/handling, submission of specimen other than nasopharyngeal swab, presence of viral mutation(s) within the areas targeted by this assay, and inadequate number of viral copies(<138 copies/mL). A negative result must be combined with clinical observations, patient history, and epidemiological information. The expected result is Negative.  Fact Sheet for Patients:  EntrepreneurPulse.com.au  Fact Sheet for Healthcare Providers:  IncredibleEmployment.be  This test is no t yet approved or cleared by the [place] FDA and  has been authorized for detection and/or diagnosis of SARS-CoV-2 by FDA under an Emergency Use Authorization (EUA). This EUA will remain  in effect (meaning this test can be used) for the duration of the COVID-19 declaration under Section 564(b)(1) of the Act, 21 U.S.C.section 360bbb-3(b)(1), unless the authorization is terminated  or revoked sooner.       Influenza A by PCR POSITIVE (A) NEGATIVE Final   Influenza B by PCR NEGATIVE NEGATIVE Final    Comment: (NOTE) The Xpert Xpress SARS-CoV-2/FLU/RSV plus assay is intended as an aid in the diagnosis of influenza from Nasopharyngeal swab  specimens and should not be used as a sole basis for treatment. Nasal washings and aspirates are unacceptable for Xpert Xpress SARS-CoV-2/FLU/RSV testing.  Fact Sheet for Patients: EntrepreneurPulse.com.au  Fact Sheet for Healthcare Providers: IncredibleEmployment.be  This test is not yet approved or cleared by the [place] FDA and has been authorized for detection and/or  diagnosis of SARS-CoV-2 by FDA under an Emergency Use Authorization (EUA). This EUA will remain in effect (meaning this test can be used) for the duration of the COVID-19 declaration under Section 564(b)(1) of the Act, 21 U.S.C. section 360bbb-3(b)(1), unless the authorization is terminated or revoked.     Resp Syncytial Virus by PCR NEGATIVE NEGATIVE Final    Comment: (NOTE) Fact Sheet for Patients: EntrepreneurPulse.com.au  Fact Sheet for Healthcare Providers: IncredibleEmployment.be  This test is not yet approved or cleared by the [place] FDA and has been authorized for detection and/or diagnosis of SARS-CoV-2 by FDA under an Emergency Use Authorization (EUA). This EUA will remain in effect (meaning this test can be used) for the duration of the COVID-19 declaration under Section 564(b)(1) of the Act, 21 U.S.C. section 360bbb-3(b)(1), unless the authorization is terminated or revoked.  Performed at [facility], [place] [address], [place], [place] [address]     Labs: CBC: Recent Labs  Lab 10/26/22 0600 10/26/22 0635 10/27/22 0942  WBC 5.4  --  4.5  HGB 11.0* 12.9 10.6*  HCT 38.3 38.0 34.7*  MCV 78.8*  --  76.9*  PLT 200  --  101   Basic Metabolic Panel: Recent Labs  Lab 10/28/22 0635 10/29/22 0808 10/30/22 0844 10/31/22 0234 10/31/22 0831  NA 139 140 136 137 139  K 3.3* 4.1 3.8 4.2 3.7  CL 96* 95* 94* 92* 94*  CO2 33* 30 34* 27 34*  GLUCOSE 86 101* 92 120* 91  BUN 17 20 21* 25* 23*  CREATININE  1.13* 1.26* 1.02* 1.34* 1.09*  CALCIUM 8.3* 8.9 8.5* 9.3 9.0  MG  --  1.6* 1.7  --   --    Liver Function Tests: Recent Labs  Lab 10/26/22 0600  AST 17  ALT 11  ALKPHOS 47  BILITOT 0.7  PROT 7.6  ALBUMIN 3.7   CBG: No results for input(s): "GLUCAP" in the last 168 hours.  Discharge time spent: greater than 30 minutes.  Signed: Elmarie Shiley, MD [facility] 11/01/2022

## 2022-11-01 NOTE — Progress Notes (Signed)
Physical Therapy Treatment Patient Details Name: [name] MRN: [id number] DOB: 09-Apr-1966 Today's Date: 11/01/2022   History of Present Illness 57 yo female with PMH significant of CHF, OHS/OSA on CPAP, COPD on 2L home O2, HTN, hypothyroidism, sarcoidosis presenting with shortness of breathe. Sone called yesterday and told her he wasn't feeling well. He went to the ER and tested positive for COVID and flu. She told him that she also wasn't feeling well and felt worse through the evening which prompted her to come to ER. She has cough, myalgias, Shortness of breathe on her usual 2L O2 at home.    PT Comments    Pt trialed quad cane this session with wide base for bariatric patients with good results following education on gait sequencing.  Antalgic gait improves, lateral sway and pt stability improves with use of LBQC. Pt is demonstrating at about baseline mobility per pt and currently no recommended skilled physical therapy on discharge from acute care hospital setting. Continues to be limited by respiratory status and has to pace activities which is pt baseline.    Recommendations for follow up therapy are one component of a multi-disciplinary discharge planning process, led by the attending physician.  Recommendations may be updated based on patient status, additional functional criteria and insurance authorization.  Follow Up Recommendations  No PT follow up     Assistance Recommended at Discharge Intermittent Supervision/Assistance  Patient can return home with the following A little help with walking and/or transfers;Help with stairs or ramp for entrance;Assistance with cooking/housework;Assist for transportation   Equipment Recommendations  None recommended by PT    Recommendations for Other Services       Precautions / Restrictions Precautions Precautions: Fall Precaution Comments: monitor O2 Restrictions Weight Bearing Restrictions: No     Mobility  Bed  Mobility       General bed mobility comments: pt sitting EOB when entering room and left sitting EOB Patient Response: Cooperative  Transfers Overall transfer level: Modified independent Equipment used: Quad cane Transfers: Sit to/from Stand Sit to Stand: Modified independent (Device/Increase time)           General transfer comment: Pt takes increased time to perform sit to stand. Utilized quad cane for sit to stand with safe hand placement at mod I    Ambulation/Gait Ambulation/Gait assistance: Modified independent (Device/Increase time) Gait Distance (Feet): 20 Feet (2x) Assistive device: Quad cane (wide based quad cane bariatric) Gait Pattern/deviations: Step-through pattern, Decreased step length - right, Decreased step length - left, Antalgic, Wide base of support   Gait velocity interpretation: <1.8 ft/sec, indicate of risk for recurrent falls   General Gait Details: improved significantly with antalgic gait following education on use of quad cane. Pt does not like RW due to cumberson to pt and does not fit within home.   Stairs Stairs:  (not applicable pt has level entry)               Balance Overall balance assessment: Modified Independent Sitting-balance support: No upper extremity supported, Feet supported Sitting balance-Leahy Scale: Good     Standing balance support: Single extremity supported, No upper extremity supported, During functional activity Standing balance-Leahy Scale: Fair Standing balance comment: no overt LOB pt balance improves and lateral sway with use of wide based quad cane.              Cognition Arousal/Alertness: Awake/alert Behavior During Therapy: WFL for tasks assessed/performed Overall Cognitive Status: Within Functional Limits for tasks assessed  General Comments General comments (skin integrity, edema, etc.): Pt gait improves with use of Wide based quad cane.      Pertinent Vitals/Pain Pain  Assessment Faces Pain Scale: Hurts little more Pain Location: left knee Pain Descriptors / Indicators: Discomfort Pain Intervention(s): Limited activity within patient's tolerance, Monitored during session, Other (comment) (utilized AD to off load knee with gait)     PT Goals (current goals can now be found in the care plan section) Acute Rehab PT Goals Patient Stated Goal: To return home PT Goal Formulation: With patient Time For Goal Achievement: 11/10/22 Potential to Achieve Goals: Fair Progress towards PT goals: Progressing toward goals    Frequency    Min 2X/week      PT Plan Current plan remains appropriate       AM-PAC PT "6 Clicks" Mobility   Outcome Measure  Help needed turning from your back to your side while in a flat bed without using bedrails?: None Help needed moving from lying on your back to sitting on the side of a flat bed without using bedrails?: None Help needed moving to and from a bed to a chair (including a wheelchair)?: None Help needed standing up from a chair using your arms (e.g., wheelchair or bedside chair)?: A Little Help needed to walk in hospital room?: A Little Help needed climbing 3-5 steps with a railing? : A Little 6 Click Score: 21    End of Session Equipment Utilized During Treatment: Oxygen Activity Tolerance: Patient tolerated treatment well;Patient limited by fatigue Patient left: in bed;with call bell/phone within reach Nurse Communication: Mobility status PT Visit Diagnosis: Unsteadiness on feet (R26.81);Other abnormalities of gait and mobility (R26.89);Muscle weakness (generalized) (M62.81)     Time: 5621-3086 PT Time Calculation (min) (ACUTE ONLY): 12 min  Charges:  $Gait Training: 8-22 mins                     Tomma Rakers, DPT, [place] Office: [phone] (Secure chat preferred)    Ander Purpura 11/01/2022, 11:31 AM

## 2022-11-01 NOTE — TOC Progression Note (Addendum)
Transition of Care [facility]) - Progression Note    Patient Details  Name: [name] MRN: [id number] Date of Birth: [birth year]  Transition of Care [facility]) CM/SW Contact  Carles Collet, RN Phone Number: 11/01/2022, 10:44 AM  Clinical Narrative:      [name] w patient at bedside, she states that her ride home will bring in oxygen to use for transportation. We discussed tub bench, and she would like that delivered to the house. Referral for bari tub bench accepted by [employer]. Tub bench will be delivered to the house today. PT recs for wide base bari quad cane. Spoke w several DME companies and this is not a stocked item, and unable to be provided. [employer] has this item for $50-60 from a few vendors, with delivery dates from Jan 5th to Jan 12th.  Per Swot nurse she spoke w PT and was told patient is ok to ambulate without it, as long as she has help. We discussed an alternative such as a RW but patient does not like the RW and feels it is too big for her house. Spoke to patient and explained barrier to sending her home w cane. Discussed option of ordering from [employer]. She is agreeable to order it herself, and for me to text her a link to one I found that met specifications. I have done this.  No other TOC needs for DC      Expected Discharge Plan and Services         Expected Discharge Date: 10/31/22                                     Social Determinants of Health (SDOH) Interventions SDOH Screenings   Food Insecurity: No Food Insecurity (11/22/2021)  Housing: Low Risk  (11/22/2021)  Transportation Needs: No Transportation Needs (11/22/2021)  Alcohol Screen: Low Risk  (12/04/2020)  Depression (PHQ2-9): Low Risk  (11/22/2021)  Financial Resource Strain: Low Risk  (11/22/2021)  Physical Activity: Inactive (11/22/2021)  Social Connections: Moderately Isolated (11/22/2021)  Stress: No Stress Concern Present (11/22/2021)  Tobacco Use: Low Risk  (10/26/2022)    Readmission Risk  Interventions     No data to display

## 2022-11-02 ENCOUNTER — Telehealth: Payer: Self-pay

## 2022-11-02 ENCOUNTER — Telehealth: Payer: Self-pay | Admitting: *Deleted

## 2022-11-02 DIAGNOSIS — J9611 Chronic respiratory failure with hypoxia: Secondary | ICD-10-CM | POA: Diagnosis not present

## 2022-11-02 NOTE — Progress Notes (Signed)
  Care Coordination  Note  11/02/2022 Name: [name] MRN: [id number] DOB: 1966/07/23  [name] is a 57 y.o. year old primary care patient of [name], NP. I reached out to [name] by phone today to assist with scheduling a follow up appointment. [name] verbally consented to my assistance.       Follow up plan: Hospital Follow Up appointment scheduled with [name], NP) on (11/09/2022) at (11am via MyChart Video).  Julian Hy, [place] Direct Dial: [phone]

## 2022-11-02 NOTE — Patient Outreach (Signed)
  Care Coordination TOC Note Transition Care Management Follow-up Telephone Call Date of discharge and from where: 11/01/22-  How have you been since you were released from the hospital? Patient voices she is doing okay. She denies any SOB at present-does get winded with exertion. She is using oxygen cont at 2L/min. She continues to have coughing spells. She is getting ready to take dose of Mucinex. Patient sent home oral abxs. Appetite has been good.  Any questions or concerns? No  Items Reviewed: Did the pt receive and understand the discharge instructions provided? Yes  Medications obtained and verified? Yes  Other? Yes  Any new allergies since your discharge? No  Dietary orders reviewed? Yes Do you have support at home? Yes -son and daughter  Home Care and Equipment/Supplies: Were home health services ordered? not applicable If so, what is the name of the agency? N/A  Has the agency set up a time to come to the patient's home? not applicable Were any new equipment or medical supplies ordered?  Yes: tub bench What is the name of the medical supply agency? [employer] Were you able to get the supplies/equipment? Not delivered yet-patient will call to schedule delivery today Do you have any questions related to the use of the equipment or supplies? No  Functional Questionnaire: (I = Independent and D = Dependent) ADLs: I  Bathing/Dressing- I  Meal Prep- I  Eating- I  Maintaining continence- I  Transferring/Ambulation- I  Managing Meds- I  Follow up appointments reviewed:  PCP Hospital f/u appt confirmed? Patient agreeable to care guide calling to assist with scheduling. She requests virtual visit as ash has AW[facility] appt in person later this month . [facility] f/u appt confirmed?  N/A   Are transportation arrangements needed? No  If their condition worsens, is the pt aware to call PCP or go to the Emergency Dept.? Yes Was the patient provided with contact  information for the PCP's office or ED? Yes Was to pt encouraged to call back with questions or concerns? Yes  SDOH assessments and interventions completed:   Yes SDOH Interventions Today    Flowsheet Row Most Recent Value  SDOH Interventions   Food Insecurity Interventions Intervention Not Indicated  Transportation Interventions Intervention Not Indicated       Care Coordination Interventions:  PCP follow up appointment requested Education provided on resp mgmt    Encounter Outcome:  Pt. Visit Completed    Enzo Montgomery, RN,BSN,CCM [place] [facility] Telephonic Care Management Coordinator Direct Phone: [phone] Toll Free: [phone] Fax: [phone]

## 2022-11-09 ENCOUNTER — Telehealth (INDEPENDENT_AMBULATORY_CARE_PROVIDER_SITE_OTHER): Payer: Medicare Other | Admitting: Adult Health

## 2022-11-09 ENCOUNTER — Encounter: Payer: Self-pay | Admitting: Adult Health

## 2022-11-09 DIAGNOSIS — I5033 Acute on chronic diastolic (congestive) heart failure: Secondary | ICD-10-CM | POA: Diagnosis not present

## 2022-11-09 DIAGNOSIS — E876 Hypokalemia: Secondary | ICD-10-CM | POA: Diagnosis not present

## 2022-11-09 DIAGNOSIS — J101 Influenza due to other identified influenza virus with other respiratory manifestations: Secondary | ICD-10-CM | POA: Diagnosis not present

## 2022-11-09 DIAGNOSIS — D860 Sarcoidosis of lung: Secondary | ICD-10-CM | POA: Diagnosis not present

## 2022-11-09 DIAGNOSIS — I10 Essential (primary) hypertension: Secondary | ICD-10-CM

## 2022-11-09 DIAGNOSIS — G4733 Obstructive sleep apnea (adult) (pediatric): Secondary | ICD-10-CM | POA: Diagnosis not present

## 2022-11-09 DIAGNOSIS — N1831 Chronic kidney disease, stage 3a: Secondary | ICD-10-CM | POA: Diagnosis not present

## 2022-11-09 NOTE — Progress Notes (Signed)
Virtual Visit via Video Note  I connected with [name] on 11/09/22 at 11:00 AM EST by a video enabled telemedicine application and verified that I am speaking with the correct person using two identifiers.  Location patient: home Location provider:work or home office Persons participating in the virtual visit: patient, provider  I discussed the limitations of evaluation and management by telemedicine and the availability of in person appointments. The patient expressed understanding and agreed to proceed.   HPI:  She is being evaluated today for TCM visit  Admit Date 10/26/2022 Discharge Date 11/01/2022  Presented to the emergency room with shortness of breath.  At this time her son was sick with COVID and flu.  She presented with cough, myalgia, shortness of breath.  On evaluation in the emergency room she was found to be positive for influenza A  Hospital Course  Influenza A  - Completed Tamiflu course and azithromycin for 5 days   Acute on Chronic diastolic heart failure  -She was hypoxic on exertion.  Treated with 40 mg IV Lasix twice daily.  She was -6 L.  Repeat chest x-ray showed cardiomegaly, mild edema.  She was transitioned to oral torsemide  OSA/OHA -due on chronic hypoxic respiratory failure -Continued on CPAP at bedtime -Continue with chronic 2 L of oxygen  Hypertension  - Continued with Toprol '25mg'$  daily  - Losartan held  Hypokalemia  - Replaced  Hyperlipidemia  - Continue with Atorvastatin   Sarcoidosis - Continued on weekly methotrexate and daily prednisone   CKD stage IIIa - Cr range 1.3-1.5 - Resumed torsemide  Super Morbid Obesity  - BMI 77  - encouraged lifestyle modification   Nausea - reports episode of nausea and diaphoreseis with cramping abd pain. This resolved prior to DC  Today she reports that she is feeling much better, continues to have mild nonproductive cough but has not had any other symptoms since being discharged  from the hospital.  She has been monitoring her blood pressures with readings in the 130s over 60s despite not taking losartan.  Feels back to baseline, is on oxygen therapy with 2 L via nasal cannula and CPAP at night.  ROS: See pertinent positives and negatives per HPI.  Past Medical History:  Diagnosis Date   Anemia    Arthritis    hands, shoulders, no meds   CHF (congestive heart failure) (HCC)    EF60-65%   Chronic hyperventilation syndrome    w/ obesity tx with albuterol inhaler and oxygen 2L   COPD (chronic obstructive pulmonary disease) (HCC)    uses oxygen 2 L   Gallstones    GERD (gastroesophageal reflux disease)    diet controlled - no meds   H/O hiatal hernia    Hypertension    Hypothyroidism    Kidney stones    Morbid obesity ([place])    Pneumonia    hoispitalized in 08/2011   Sarcoidosis    Seasonal allergies    Sleep apnea    uses CPAP machine     Past Surgical History:  Procedure Laterality Date   CESAREAN SECTION  1994   x 1   CHOLECYSTECTOMY  2000   DILATION AND CURETTAGE OF UTERUS N/A 08/09/2016   Procedure: DILATATION AND CURETTAGE;  Surgeon: Everitt Amber, MD;  Location: [facility];  Service: Gynecology;  Laterality: N/A;   HYSTEROSCOPY WITH D & C  12/27/2011   Procedure: DILATATION AND CURETTAGE /HYSTEROSCOPY;  Surgeon: Maeola Sarah. Landry Mellow, MD;  Location: [facility];  Service:  Gynecology;;   I and D of abcess  05/2011   INTRAUTERINE DEVICE (IUD) INSERTION N/A 08/09/2016   Procedure: INTRAUTERINE DEVICE (IUD) INSERTION;  Surgeon: Everitt Amber, MD;  Location: [facility];  Service: Gynecology;  Laterality: N/A;   LUNG BIOPSY     uterine abletion      Family History  Problem Relation Age of Onset   Diabetes Father    Cancer Father 13       colon cancer    Diabetes Brother    Deep vein thrombosis Mother    Aneurysm Sister        d/o brain aneurysm       Current Outpatient Medications:    acetaminophen (TYLENOL) 500 MG tablet, Take 1 tablet (500 mg total) by mouth  every 6 (six) hours as needed., Disp: 30 tablet, Rfl: 0   albuterol (VENTOLIN HFA) 108 (90 Base) MCG/ACT inhaler, INHALE 1-2 PUFFS BY MOUTH EVERY 6 HOURS AS NEEDED FOR WHEEZE OR SHORTNESS OF BREATH, Disp: 8.5 each, Rfl: 3   atorvastatin (LIPITOR) 40 MG tablet, Take 1 tablet (40 mg total) by mouth daily at 6 PM., Disp: 90 tablet, Rfl: 3   azithromycin (ZITHROMAX) 500 MG tablet, Take 1 tablet (500 mg total) by mouth daily., Disp: 4 tablet, Rfl: 0   calcium citrate-vitamin D (CITRACAL+D) 315-200 MG-UNIT tablet, Take 1 tablet by mouth daily., Disp: , Rfl:    cetirizine (ZYRTEC) 10 MG tablet, Take 10 mg by mouth at bedtime as needed for allergies., Disp: , Rfl:    cyclobenzaprine (FLEXERIL) 10 MG tablet, Take 1 tablet (10 mg total) by mouth 3 (three) times daily as needed for muscle spasms., Disp: 30 tablet, Rfl: 0   famotidine (PEPCID) 40 MG tablet, TAKE 1 TABLET BY MOUTH TWICE A DAY (Patient taking differently: Take 40 mg by mouth 2 (two) times daily.), Disp: 60 tablet, Rfl: 0   folic acid (FOLVITE) 1 MG tablet, Take 1 mg by mouth daily., Disp: , Rfl: 3   guaiFENesin (MUCINEX) 600 MG 12 hr tablet, Take 1 tablet (600 mg total) by mouth 2 (two) times daily as needed for cough., Disp: 30 tablet, Rfl: 0   HYDROcodone-acetaminophen (NORCO) 10-325 MG tablet, Take 1 tablet by mouth every 6 hours as needed (Patient taking differently: Take 1 tablet by mouth 3 (three) times daily as needed for moderate pain.), Disp: 120 tablet, Rfl: 0   methotrexate 50 MG/2ML injection, Inject 20 mg into the vein once a week. Dose is 0.8 mL ('20mg'$ ). Take on Fridays, Disp: , Rfl:    metoprolol succinate (TOPROL-XL) 25 MG 24 hr tablet, Take 1 tablet (25 mg total) by mouth daily., Disp: 90 tablet, Rfl: 3   NON FORMULARY, 2 liter of oxygen, Disp: , Rfl:    ondansetron (ZOFRAN) 4 MG tablet, Take 1 tablet (4 mg total) by mouth every 8 (eight) hours as needed for nausea or vomiting., Disp: 20 tablet, Rfl: 0   predniSONE (DELTASONE) 10 MG  tablet, Take 1 tablet (10 mg total) by mouth daily., Disp: 10 tablet, Rfl: 0   torsemide (DEMADEX) 20 MG tablet, Take 1 tablet (20 mg total) by mouth daily. Take two tablets ( 40 mg) for increase wt gain of 3 lbs in one day or 5 lbs in one week. Please make overdue appt with Dr. [name] before anymore refills. Thank you 2nd attempt, Disp: 22 tablet, Rfl: 0   triamcinolone ointment (KENALOG) 0.1 %, Apply 1 application topically 2 (two) times daily. For Sarcoidosis, Disp: ,  Rfl:    vitamin B-12 1000 MCG tablet, Take 1 tablet (1,000 mcg total) by mouth daily., Disp: 30 tablet, Rfl: 0  EXAM:  VITALS per patient if applicable:  GENERAL: alert, oriented, appears well and in no acute distress  HEENT: atraumatic, conjunttiva clear, no obvious abnormalities on inspection of external nose and ears  NECK: normal movements of the head and neck  LUNGS: on inspection no signs of respiratory distress, breathing rate appears normal, no obvious gross SOB, gasping or wheezing. Wearing nasal canula.   CV: no obvious cyanosis  MS: moves all visible extremities without noticeable abnormality  PSYCH/NEURO: pleasant and cooperative, no obvious depression or anxiety, speech and thought processing grossly intact  ASSESSMENT AND PLAN:  Discussed the following assessment and plan:  1. Influenza A - Reviewed hospital admission notes, discharge instructions, labs, imaging, and medication changes. - Resolved  2. Acute on chronic diastolic congestive heart failure ([place]) - Continue with Torsemide.  - Avoid sodium - Weights daily and notify if greater than 3 pounds in a week  3. OSA (obstructive sleep apnea) - Continue nasal canula and CPAP  4. Primary hypertension - Advised to continue to monitor BP at home, if trending above 035 systolic then restart Losartan   5. Hypokalemia - Replenished in hospital   6. Sarcoidosis of lung ([place]) - Continue with methotrexate weekly and prednisone daily  7. Stage 3a  chronic kidney disease (HCC) - Avoid nephrotoxic agents  8. Morbid obesity ([place]) - Needs weight loss      I discussed the assessment and treatment plan with the patient. The patient was provided an opportunity to ask questions and all were answered. The patient agreed with the plan and demonstrated an understanding of the instructions.   The patient was advised to call back or seek an in-person evaluation if the symptoms worsen or if the condition fails to improve as anticipated.   [name], NP

## 2022-11-15 ENCOUNTER — Encounter: Payer: Self-pay | Admitting: Adult Health

## 2022-11-15 NOTE — Telephone Encounter (Signed)
Patient states she sent a message for advice on mychart from care team. Says when she received the call the person that called responded and when she had an additional question the person told her she was not going to go back and forth with her and disconnected the call. Says she feels the messages are not getting back to the doctor and that the assistant is just answering with a standard reply of "come in to the office". Says she has sent pictures and other messages that she feels are not being routed correctly. Patient says her rheumatologist (Mr Berna Bue) is not on the same records system that we are under.

## 2022-11-15 NOTE — Telephone Encounter (Signed)
Called pt back to advised that Manager was heading out for today and will call her back ASAP when she returns to the office. Pt verbalized understanding and stated that she made a 2nd complaint at this point.

## 2022-11-15 NOTE — Telephone Encounter (Signed)
I called pt to advised that an appointment was needed and pt became combative. Was unable to speak to pt to explain the reason behind the appointment due to pt speaking over me. At that moment I advised pt to return call when she is ready to respectfully speak and listen.

## 2022-11-24 ENCOUNTER — Encounter: Payer: Self-pay | Admitting: Hematology

## 2022-12-01 ENCOUNTER — Encounter: Payer: Self-pay | Admitting: Family Medicine

## 2022-12-01 ENCOUNTER — Telehealth (INDEPENDENT_AMBULATORY_CARE_PROVIDER_SITE_OTHER): Payer: 59 | Admitting: Family Medicine

## 2022-12-01 VITALS — BP 139/70 | Temp 98.1°F | Wt >= 6400 oz

## 2022-12-01 DIAGNOSIS — G4733 Obstructive sleep apnea (adult) (pediatric): Secondary | ICD-10-CM | POA: Diagnosis not present

## 2022-12-01 DIAGNOSIS — Z0000 Encounter for general adult medical examination without abnormal findings: Secondary | ICD-10-CM

## 2022-12-01 NOTE — Progress Notes (Signed)
PATIENT CHECK-IN and HEALTH RISK ASSESSMENT QUESTIONNAIRE:  -completed by phone/video for upcoming Medicare Preventive Visit  Pre-Visit Check-in: 1)Vitals (height, wt, BP, etc) - record in vitals section for visit on day of visit 2)Review and Update Medications, Allergies PMH, Surgeries, Social history in Epic 3)Hospitalizations in the last year with date/reason? Yes/ Influenza- Shortness of breath   4)Review and Update Care Team (patient's specialists) in Epic 5) Complete PHQ9 in Epic  6) Complete Fall Screening in Epic 7)Review all Health Maintenance Due and order under PCP if not done.  8)Medicare Wellness Questionnaire: Answer theses question about your habits: Do you drink alcohol? Occasionally,  2 glasses Have you ever smoked?No Quit date if applicable? N/A  How many packs a day do/did you smoke? N/a Do you use smokeless tobacco? No Do you use an illicit drugs?No Do you exercises? No, reports mobility limited due to respiratory disease on O2 and weight Are you sexually active? No Number of partners?N/A Prepares some meals, restaurant food sometimes, she in not interested in learning about healthy dietary patterns for weight reduction/health Typical breakfast: Cereal and Bananas, Sandwich  Typical lunch N/A Typical dinner: Chicken, Potatoes, Vegetables Typical snacks: Potato chips, Cheese   Beverages: Lemonade  Answer theses question about you: Can you perform most household chores? No Do you find it hard to follow a conversation in a noisy room?No Do you often ask people to speak up or repeat themselves?No Do you feel that you have a problem with memory?No Do you balance your checkbook and or bank acounts?Yes Do you feel safe at home?Yes Last dentist visit? 4 years ago Do you need assistance with any of the following: Please note if so  Driving? No  Feeding yourself? No  Getting from bed to chair? No  Getting to the toilet? Declined  Bathing or showering?  Declined  Dressing yourself? Declined  Managing money? Declined   Climbing a flight of stairs Declined  Preparing meals? Declined  Do you have Advanced Directives in place (Living Will, Healthcare Power or Attorney)? Yes   Last eye Exam and location? 4 years ago   Do you currently use prescribed or non-prescribed narcotic or opioid pain medications? No  Do you have a history or close family history of breast, ovarian, tubal or peritoneal cancer or a family member with BRCA (breast cancer susceptibility 1 and 2) gene mutations? Yes  Nurse/Assistant Credentials/time stamp: MG 2:28 PM   ----------------------------------------------------------------------------------------------------------------------------------------------------------------------------------------------------------------------   MEDICARE ANNUAL PREVENTIVE VISIT WITH PROVIDER: (Welcome to Medicare, initial annual wellness or annual wellness exam)  Virtual Visit via Video Note  I connected with [name]  on 12/01/2022 a video enabled telemedicine application and verified that I am speaking with the correct person using two identifiers.  Location patient: home Location provider:work or home office Persons participating in the virtual visit: patient, provider  Concerns and/or follow up today:none, reports stable   See HM section in Epic for other details of completed HM.    ROS: negative for report of fevers, unintentional weight loss, vision changes, vision loss, hearing loss or change, chest pain, sob, hemoptysis, melena, hematochezia, hematuria, genital discharge or lesions, falls, bleeding or bruising, loc, thoughts of suicide or self harm, memory loss  Patient-completed extensive health risk assessment - reviewed and discussed with the patient: See Health Risk Assessment completed with patient prior to the visit either above or in recent phone note. This was reviewed in detailed with the patient today and  appropriate recommendations, orders and referrals were placed as needed  per Summary below and patient instructions.   Review of Medical History: -PMH, PSH, Family History and current specialty and care providers reviewed and updated and listed below   Patient Care Team: [name], NP as PCP - General (Family Medicine) Jerline Pain, MD as PCP - Cardiology (Cardiology) Wonda Horner, MD (Gastroenterology) Rigoberto Noel, MD as Consulting Physician (Pulmonary Disease) Gavin Pound, MD as Consulting Physician (Rheumatology)   Past Medical History:  Diagnosis Date   Anemia    Arthritis    hands, shoulders, no meds   CHF (congestive heart failure) (HCC)    EF60-65%   Chronic hyperventilation syndrome    w/ obesity tx with albuterol inhaler and oxygen 2L   COPD (chronic obstructive pulmonary disease) ([place])    uses oxygen 2 L   Gallstones    GERD (gastroesophageal reflux disease)    diet controlled - no meds   H/O hiatal hernia    Hypertension    Hypothyroidism    Kidney stones    Morbid obesity ([place])    Pneumonia    hoispitalized in 08/2011   Sarcoidosis    Seasonal allergies    Sleep apnea    uses CPAP machine     Past Surgical History:  Procedure Laterality Date   [place]   x 1   CHOLECYSTECTOMY  2000   DILATION AND CURETTAGE OF UTERUS N/A 08/09/2016   Procedure: DILATATION AND CURETTAGE;  Surgeon: Everitt Amber, MD;  Location: [facility];  Service: Gynecology;  Laterality: N/A;   HYSTEROSCOPY WITH D & C  12/27/2011   Procedure: DILATATION AND CURETTAGE /HYSTEROSCOPY;  Surgeon: Maeola Sarah. Landry Mellow, MD;  Location: [facility];  Service: Gynecology;;   I and D of abcess  05/2011   INTRAUTERINE DEVICE (IUD) INSERTION N/A 08/09/2016   Procedure: INTRAUTERINE DEVICE (IUD) INSERTION;  Surgeon: Everitt Amber, MD;  Location: [facility];  Service: Gynecology;  Laterality: N/A;   LUNG BIOPSY     uterine abletion      Social History   Socioeconomic History   Marital status:  Single    Spouse name: Not on file   Number of children: 1   Years of education: 16   Highest education level: Bachelor's degree (e.g., BA, AB, BS)  Occupational History   Occupation: disability   Tobacco Use   Smoking status: Never   Smokeless tobacco: Never  Vaping Use   Vaping Use: Never used  Substance and Sexual Activity   Alcohol use: Yes    Comment: occasionally   Drug use: No   Sexual activity: Never    Birth control/protection: None  Other Topics Concern   Not on file  Social History Narrative   Son lives with patient.   Divorced for eight or nine years   disabled   Social Determinants of Health   Financial Resource Strain: Low Risk  (11/22/2021)   Overall Financial Resource Strain (CARDIA)    Difficulty of Paying Living Expenses: Not hard at all  Food Insecurity: No Food Insecurity (11/02/2022)   Hunger Vital Sign    Worried About Running Out of Food in the Last Year: Never true    Ran Out of Food in the Last Year: Never true  Transportation Needs: No Transportation Needs (11/02/2022)   PRAPARE - Hydrologist (Medical): No    Lack of Transportation (Non-Medical): No  Physical Activity: Inactive (11/22/2021)   Exercise Vital Sign    Days of Exercise per  Week: 0 days    Minutes of Exercise per Session: 0 min  Stress: No Stress Concern Present (11/22/2021)   [place]    Feeling of Stress : Not at all  Social Connections: Moderately Isolated (11/22/2021)   Social Connection and Isolation Panel [NHANES]    Frequency of Communication with Friends and Family: More than three times a week    Frequency of Social Gatherings with Friends and Family: More than three times a week    Attends Religious Services: Never    Marine scientist or Organizations: Yes    Attends Archivist Meetings: Not on file    Marital Status: Divorced  Intimate Partner Violence: Not At  Risk (11/22/2021)   Humiliation, Afraid, Rape, and Kick questionnaire    Fear of Current or Ex-Partner: No    Emotionally Abused: No    Physically Abused: No    Sexually Abused: No    Family History  Problem Relation Age of Onset   Diabetes Father    Cancer Father 69       colon cancer    Diabetes Brother    Deep vein thrombosis Mother    Aneurysm Sister        d/o brain aneurysm    Current Outpatient Medications on File Prior to Visit  Medication Sig Dispense Refill   acetaminophen (TYLENOL) 500 MG tablet Take 1 tablet (500 mg total) by mouth every 6 (six) hours as needed. 30 tablet 0   albuterol (VENTOLIN HFA) 108 (90 Base) MCG/ACT inhaler INHALE 1-2 PUFFS BY MOUTH EVERY 6 HOURS AS NEEDED FOR WHEEZE OR SHORTNESS OF BREATH 8.5 each 3   atorvastatin (LIPITOR) 40 MG tablet Take 1 tablet (40 mg total) by mouth daily at 6 PM. 90 tablet 3   azithromycin (ZITHROMAX) 500 MG tablet Take 1 tablet (500 mg total) by mouth daily. 4 tablet 0   calcium citrate-vitamin D (CITRACAL+D) 315-200 MG-UNIT tablet Take 1 tablet by mouth daily.     cetirizine (ZYRTEC) 10 MG tablet Take 10 mg by mouth at bedtime as needed for allergies.     cyclobenzaprine (FLEXERIL) 10 MG tablet Take 1 tablet (10 mg total) by mouth 3 (three) times daily as needed for muscle spasms. 30 tablet 0   famotidine (PEPCID) 40 MG tablet TAKE 1 TABLET BY MOUTH TWICE A DAY (Patient taking differently: Take 40 mg by mouth 2 (two) times daily.) 60 tablet 0   folic acid (FOLVITE) 1 MG tablet Take 1 mg by mouth daily.  3   furosemide (LASIX) 20 MG tablet Take 20 mg by mouth 2 (two) times daily.     HYDROcodone-acetaminophen (NORCO) 10-325 MG tablet Take 1 tablet by mouth every 6 hours as needed (Patient taking differently: Take 1 tablet by mouth 3 (three) times daily as needed for moderate pain.) 120 tablet 0   methotrexate 50 MG/2ML injection Inject 20 mg into the vein once a week. Dose is 0.8 mL ('20mg'$ ). Take on Fridays     metoprolol  succinate (TOPROL-XL) 25 MG 24 hr tablet Take 1 tablet (25 mg total) by mouth daily. 90 tablet 3   NON FORMULARY 2 liter of oxygen     ondansetron (ZOFRAN) 4 MG tablet Take 1 tablet (4 mg total) by mouth every 8 (eight) hours as needed for nausea or vomiting. 20 tablet 0   predniSONE (DELTASONE) 10 MG tablet Take 1 tablet (10 mg total) by mouth daily. 10  tablet 0   triamcinolone ointment (KENALOG) 0.1 % Apply 1 application topically 2 (two) times daily. For Sarcoidosis     vitamin B-12 1000 MCG tablet Take 1 tablet (1,000 mcg total) by mouth daily. 30 tablet 0   No current facility-administered medications on file prior to visit.    Allergies  Allergen Reactions   Tape Itching    EKG Leads cause Skin burns and irritation after 24 hours+       Physical Exam Vitals:   12/01/22 1408  BP: 139/70  Temp: 98.1 F (36.7 C)   Estimated body mass index is 74.76 kg/m as calculated from the following:   Height as of 10/29/22: '5\' 6"'$  (1.676 m).   Weight as of this encounter: 463 lb 3 oz (210.1 kg).  EKG (optional): deferred due to virtual visit  GENERAL: alert, oriented, no acute distress detected, full vision exam deferred due to pandemic and/or virtual encounter  HEENT: atraumatic, conjunttiva clear, no obvious abnormalities on inspection of external nose and ears  NECK: normal movements of the head and neck  LUNGS: on inspection no signs of respiratory distress, breathing rate appears normal, no obvious gross SOB, gasping or wheezing, O2 via [place]  CV: no obvious cyanosis  MS: moves all visible extremities without noticeable abnormality  PSYCH/NEURO: pleasant and cooperative, no obvious depression or anxiety, speech and thought processing grossly intact, Cognitive function grossly intact  Flowsheet Row Video Visit from 12/01/2022 in [place] at [facility]  PHQ-9 Total Score 2           12/01/2022    2:14 PM 11/09/2022   11:04 AM 11/22/2021    8:22 AM 12/04/2020     9:54 AM 11/06/2019   10:25 AM  Depression screen PHQ 2/9  Decreased Interest 0 0 0 0 0  Down, Depressed, Hopeless 0 0 0 0 0  PHQ - 2 Score 0 0 0 0 0  Altered sleeping 1   1   Tired, decreased energy 0   3   Change in appetite 1   0   Feeling bad or failure about yourself  0   0   Trouble concentrating 0   0   Moving slowly or fidgety/restless 0   0   Suicidal thoughts 0   0   PHQ-9 Score 2   4   Difficult doing work/chores Not difficult at all   Not difficult at all        10/30/2022    2:00 PM 10/30/2022   11:00 PM 10/31/2022    8:00 AM 10/31/2022    8:00 PM 12/01/2022    2:16 PM  Fall Risk  Falls in the past year?     0  Was there an injury with Fall?     0  Fall Risk Category Calculator     0  (RETIRED) Patient Fall Risk Level Moderate fall risk Moderate fall risk Moderate fall risk Moderate fall risk   Patient at Risk for Falls Due to     No Fall Risks  Fall risk Follow up     Falls evaluation completed     SUMMARY AND PLAN:  Encounter for Medicare annual wellness exam   Discussed applicable health maintenance/preventive health measures and advised and referred or ordered per patient preferences:  Health Maintenance  Topic Date Due   Hepatitis C Screening  Never done, discussed, she can get with PCP if decides to do   DTaP/Tdap/Td (1 - Tdap) Never done,  discussed - reports she discussed w/ PCP and does not feels needs. Agrees to seek care if any wounds/bites/etc.    COLON CANCER SCREENING ANNUAL FOBT  08/10/2021, reports received in mail - not doing cologuard due to lose stools   Zoster Vaccines- Shingrix (2 of 2) 02/08/2022, plans to get 2nd dose   COVID-19 Vaccine (4 - 2023-24 season) 07/01/2022   MAMMOGRAM  Declined, advised if not doing mammogram to consider self breast exam monthly and to seek care immediately if any lumps/bumps/abnormalities, etc.    PAP SMEAR-Modifier  Declined.   COLONOSCOPY (Pts 45-18yr Insurance coverage will need to be confirmed)  Using  alt method   Medicare Annual Wellness (AWV)  12/02/2023   HIV Screening  Completed   HPV VACCINES  Aged [place] counseling on the following was provided based on the above review of health and a plan/checklist for the patient, along with additional information discussed, was provided for the patient in the patient instructions :   -Provided counseling and plan for function difficulties/ difficulties with ADLs if applicable per above screening. -advised on importance of healthy diet and moving as much as possible. She is not interested in learning about healthy eating at this time, so did not provide usual handout as is in precontemplation phase. -mobility is limited by health, advised of benefits of physical activity and suggested consideration of movin arms and legs, chair exercises even if limited in walking -Advise yearly dental visits at minimum and regular eye exams  Follow up: see patient instructions     Patient Instructions  I really enjoyed getting to talk with you today! I am available on Tuesdays and Thursdays for virtual visits if you have any questions or concerns, or if I can be of any further assistance.   CHECKLIST FROM ANNUAL WELLNESS VISIT:  -Follow up (please call to schedule if not scheduled after visit):  -Inperson visit with your Primary Doctor office: as planned -yearly for annual wellness visit with primary care office  Here is a list of your preventive care/health maintenance measures and the plan for each if any are due:  Health Maintenance  Topic Date Due   Hepatitis C Screening  Never done   DTaP/Tdap/Td (1 - Tdap) Never done   COLON cancer screening Please complete the colon cancer screening and return   MAMMOGRAM  Never done, if you change your mind and wish to do, please let uKoreaknow   PAP SMEAR-Modifier  11/01/2019, please let uKoreaknow if you wish to do this.    Zoster Vaccines- Shingrix (2 of 2) 02/08/2022   COVID-19 Vaccine (4 - 2023-24  season) 07/01/2022   Medicare Annual Wellness (AWV)  12/02/2023   HIV Screening  Completed   HPV VACCINES  Aged Out    -See a dentist at least yearly  -Get your eyes checked and then per your eye specialist's recommendations  -Other issues addressed today:  Physical activity: consider chair exercises, leg and arm circles, moving legs and arms while seated           HLucretia Kern DO

## 2022-12-01 NOTE — Patient Instructions (Addendum)
I really enjoyed getting to talk with you today! I am available on Tuesdays and Thursdays for virtual visits if you have any questions or concerns, or if I can be of any further assistance.   CHECKLIST FROM ANNUAL WELLNESS VISIT:  -Follow up (please call to schedule if not scheduled after visit):  -Inperson visit with your Primary Doctor office: as planned -yearly for annual wellness visit with primary care office  Here is a list of your preventive care/health maintenance measures and the plan for each if any are due:  Health Maintenance  Topic Date Due   Hepatitis C Screening  Never done   DTaP/Tdap/Td (1 - Tdap) Never done   COLON cancer screening Please complete the colon cancer screening and return   MAMMOGRAM  Never done, if you change your mind and wish to do, please let us know   PAP SMEAR-Modifier  11/01/2019, please let us know if you wish to do this.    Zoster Vaccines- Shingrix (2 of 2) 02/08/2022   COVID-19 Vaccine (4 - 2023-24 season) 07/01/2022   Medicare Annual Wellness (AWV)  12/02/2023   HIV Screening  Completed   HPV VACCINES  Aged Out    -See a dentist at least yearly  -Get your eyes checked and then per your eye specialist's recommendations  -Other issues addressed today:  Physical activity: consider chair exercises, leg and arm circles, moving legs and arms while seated

## 2022-12-25 ENCOUNTER — Other Ambulatory Visit: Payer: Self-pay | Admitting: Adult Health

## 2022-12-25 DIAGNOSIS — E785 Hyperlipidemia, unspecified: Secondary | ICD-10-CM

## 2022-12-27 NOTE — Telephone Encounter (Signed)
PT needs a follow up appt. For further refills

## 2023-01-09 DIAGNOSIS — G4733 Obstructive sleep apnea (adult) (pediatric): Secondary | ICD-10-CM | POA: Diagnosis not present

## 2023-01-10 ENCOUNTER — Other Ambulatory Visit: Payer: Self-pay | Admitting: Adult Health

## 2023-01-10 DIAGNOSIS — E785 Hyperlipidemia, unspecified: Secondary | ICD-10-CM

## 2023-01-10 NOTE — Telephone Encounter (Signed)
Patient need to schedule an ov for more refills. 

## 2023-01-31 DIAGNOSIS — G4733 Obstructive sleep apnea (adult) (pediatric): Secondary | ICD-10-CM | POA: Diagnosis not present

## 2023-03-02 DIAGNOSIS — G4733 Obstructive sleep apnea (adult) (pediatric): Secondary | ICD-10-CM | POA: Diagnosis not present

## 2023-03-04 ENCOUNTER — Other Ambulatory Visit: Payer: Self-pay | Admitting: Adult Health

## 2023-03-04 DIAGNOSIS — I10 Essential (primary) hypertension: Secondary | ICD-10-CM

## 2023-03-08 NOTE — Telephone Encounter (Signed)
Patient need to schedule an ov for more refills. 

## 2023-03-08 NOTE — Telephone Encounter (Signed)
Called pt no answer. Will send in 30 days

## 2023-04-06 ENCOUNTER — Other Ambulatory Visit: Payer: Self-pay | Admitting: Adult Health

## 2023-04-06 DIAGNOSIS — I10 Essential (primary) hypertension: Secondary | ICD-10-CM

## 2023-04-06 NOTE — Telephone Encounter (Signed)
Pt needs an appt. For further refills 

## 2023-04-10 ENCOUNTER — Other Ambulatory Visit: Payer: Self-pay | Admitting: Adult Health

## 2023-04-10 DIAGNOSIS — I10 Essential (primary) hypertension: Secondary | ICD-10-CM

## 2023-05-01 DIAGNOSIS — G4733 Obstructive sleep apnea (adult) (pediatric): Secondary | ICD-10-CM | POA: Diagnosis not present

## 2023-05-11 DIAGNOSIS — G4733 Obstructive sleep apnea (adult) (pediatric): Secondary | ICD-10-CM | POA: Diagnosis not present

## 2023-06-08 ENCOUNTER — Telehealth (INDEPENDENT_AMBULATORY_CARE_PROVIDER_SITE_OTHER): Payer: 59 | Admitting: Adult Health

## 2023-06-08 ENCOUNTER — Encounter: Payer: Self-pay | Admitting: Adult Health

## 2023-06-08 VITALS — BP 158/79 | HR 82

## 2023-06-08 DIAGNOSIS — J9610 Chronic respiratory failure, unspecified whether with hypoxia or hypercapnia: Secondary | ICD-10-CM

## 2023-06-08 DIAGNOSIS — J9611 Chronic respiratory failure with hypoxia: Secondary | ICD-10-CM

## 2023-06-08 DIAGNOSIS — G4733 Obstructive sleep apnea (adult) (pediatric): Secondary | ICD-10-CM

## 2023-06-08 DIAGNOSIS — R5381 Other malaise: Secondary | ICD-10-CM

## 2023-06-08 DIAGNOSIS — Z6841 Body Mass Index (BMI) 40.0 and over, adult: Secondary | ICD-10-CM

## 2023-06-08 DIAGNOSIS — D860 Sarcoidosis of lung: Secondary | ICD-10-CM | POA: Diagnosis not present

## 2023-06-08 DIAGNOSIS — E662 Morbid (severe) obesity with alveolar hypoventilation: Secondary | ICD-10-CM

## 2023-06-08 MED ORDER — PREDNISONE 10 MG PO TABS: 10.0000 mg | ORAL_TABLET | Freq: Every day | ORAL | 4 refills | Status: DC

## 2023-06-08 NOTE — Patient Instructions (Addendum)
Order for Bariatric wheelchair - has to have for medical appointments and mobility.  Continue on Prednisone 10mg  daily .  Low salt diet  Legs elevated  Remains on Oxygen 2l/m .  Continue on BIPAP At bedtime  with oxygen . BIPAP download , needs SD card  Refer to healthy weight and wellness Refer to [facility] surgery for bariatric surgery evaluation Follow up with Dr. Vassie Loll  in 4 months (in person)  and As needed   Please contact office for sooner follow up if symptoms do not improve or worsen or seek emergency care

## 2023-06-08 NOTE — Progress Notes (Signed)
Virtual Visit via Video Note  I connected with Trisha Mangle on 06/08/23 at 10:30 AM EDT by a video enabled telemedicine application and verified that I am speaking with the correct person using two identifiers.  Location: Patient: Home  Provider: Office    I discussed the limitations of evaluation and management by telemedicine and the availability of in person appointments. The patient expressed understanding and agreed to proceed.  History of Present Illness: 57 year old female followed for obstructive sleep apnea/OHS and presumed sarcoidosis with bilateral pulmonary infiltrates with hilar and mediastinal lymphadenopathy and associated skin and joint involvement.  She is followed by rheumatology on chronic steroids previously on methotrexate Medical history significant for morbid obesity with BMI >70  Today's video visit is a follow-up for obstructive sleep apnea and OHS, chronic respiratory failure on BIPAP with oxygen.  Patient has presumed sarcoidosis with bilateral pulmonary infiltrates with hilar and mediastinal lymphadenopathy and associated skin and joint involvement.  She was previously followed by rheumatology and has been on prednisone 10 mg daily.  She was also on methotrexate.  Patient was last seen here in the office in August 2022.  She says she is no longer able to follow-up with rheumatology.  She is unable to get out of her house to go to office visits.  She remains on oxygen 2 L 24/7. Has been off Methotrexate for last 3 months . Remains on prednisone 10mg  daily. Has been on this for years.  She is very sedentary has difficulty walking any amount of distance.  Has severe knee pain.  She does have a home health aide that comes to help her at home. Lives alone.  Was admitted in January 2024 for influenza and decompensated diastolic heart failure. Chest xray interstitial prominence and unchanged hila/mediastinum Unable to get BIPAP download. Wear every night . Can not  sleep without it. Mask does leak.  Has mobile scooter but she does not feel safe in it. They tried several different options but she does not feel she can use.  Weight is up to 510lbs.  Denies increased cough or congestion  We discussed weight loss .    Observations/Objective: Sitting up in chair on O2. -NAD   Assessment and Plan: Sarcoid - appears stable. On chronic steroids for years.  Chest x-ray in January showed stable changes.  Clinically no flare of cough or wheezing.  Unfortunately she is no longer being followed by rheumatology due to lack of ability to get to office appointments. Will refill prednisone 10 mg daily.  Advised patient she will need to come in there to the office at least once a year and also keep follow-ups with primary care in person at least once a year so we can continue to follow her lab work.  Severe deconditioning with morbid obesity with BMI greater than 70-patient has very limited mobility is able to transition from bed to chair and can make a few steps but otherwise is pretty much chair bound.  Needs a bariatric wheelchair so she can move around in the home and to medical appointments.  Order for bariatric wheelchair.  We did discuss home physical therapy.  She wants to hold off on this time.  Previously got a motorized scooter but feels very unsafe on it and is unable to use because she feels like she is going to fall off.  Obstructive sleep apnea and OHS.  Continue on nocturnal BiPAP with oxygen.  Patient needs a BiPAP SD card.  BiPAP download has been  requested  Morbid obesity-current weight reported as 510 pounds-Long discussion with patient regarding weight loss.  Patient has been unable to lose any amount of weight.  Will refer to healthy weight and wellness and [place] surgery for bariatric surgery  Chronic respiratory Failure -continue on oxygen 2 L to maintain O2 saturations greater than 88 to 90%   Plan  Patient Instructions  Order for  Bariatric wheelchair - has to have for medical appointments and mobility.  Continue on Prednisone 10mg  daily .  Low salt diet  Legs elevated  Remains on Oxygen 2l/m .  Continue on BIPAP At bedtime  with oxygen . BIPAP download , needs SD card  Refer to healthy weight and wellness Refer to [facility] surgery for bariatric surgery evaluation Follow up with Dr. Vassie Loll  in 4 months (in person)  and As needed   Please contact office for sooner follow up if symptoms do not improve or worsen or seek emergency care      Follow Up Instructions:    I discussed the assessment and treatment plan with the patient. The patient was provided an opportunity to ask questions and all were answered. The patient agreed with the plan and demonstrated an understanding of the instructions.   The patient was advised to call back or seek an in-person evaluation if the symptoms worsen or if the condition fails to improve as anticipated.  I provided 42  minutes of non-face-to-face time during this encounter.   Rubye Oaks, NP

## 2023-06-08 NOTE — Addendum Note (Signed)
Addended by: Delrae Rend on: 06/08/2023 02:27 PM   Modules accepted: Orders

## 2023-06-08 NOTE — Addendum Note (Signed)
Addended by: Delrae Rend on: 06/08/2023 02:29 PM   Modules accepted: Orders

## 2023-06-15 ENCOUNTER — Telehealth: Payer: 59 | Admitting: Adult Health

## 2023-06-30 ENCOUNTER — Encounter: Payer: Self-pay | Admitting: Adult Health

## 2023-06-30 DIAGNOSIS — N926 Irregular menstruation, unspecified: Secondary | ICD-10-CM

## 2023-07-02 DIAGNOSIS — G4733 Obstructive sleep apnea (adult) (pediatric): Secondary | ICD-10-CM | POA: Diagnosis not present

## 2023-07-04 NOTE — Telephone Encounter (Signed)
Please advise 

## 2023-08-11 DIAGNOSIS — G4733 Obstructive sleep apnea (adult) (pediatric): Secondary | ICD-10-CM | POA: Diagnosis not present

## 2023-08-21 ENCOUNTER — Telehealth: Payer: Self-pay

## 2023-08-21 NOTE — Patient Outreach (Signed)
Successful call to patient on today regarding preventative mammogram screening. Patient declined at this time as she shared her physical size is a reason for not being able to get imaging completed and will follow up with PCP at later date if other options arise.  Baruch Gouty [facility] Assistant [facility] Population [facility] [phone]

## 2023-10-01 DIAGNOSIS — G4733 Obstructive sleep apnea (adult) (pediatric): Secondary | ICD-10-CM | POA: Diagnosis not present

## 2023-11-11 DIAGNOSIS — G4733 Obstructive sleep apnea (adult) (pediatric): Secondary | ICD-10-CM | POA: Diagnosis not present

## 2023-11-13 DIAGNOSIS — G4733 Obstructive sleep apnea (adult) (pediatric): Secondary | ICD-10-CM | POA: Diagnosis not present

## 2023-12-05 ENCOUNTER — Ambulatory Visit (INDEPENDENT_AMBULATORY_CARE_PROVIDER_SITE_OTHER): Payer: 59 | Admitting: Family Medicine

## 2023-12-05 VITALS — BP 159/88 | HR 87

## 2023-12-05 DIAGNOSIS — Z0000 Encounter for general adult medical examination without abnormal findings: Secondary | ICD-10-CM

## 2023-12-05 NOTE — Progress Notes (Signed)
PATIENT CHECK-IN and HEALTH RISK ASSESSMENT QUESTIONNAIRE:  -completed by phone/video for upcoming Medicare Preventive Visit  -PLEASE SELECT "NOT IN PERSON" for the method of visit.   Pre-Visit Check-in: 1)Vitals (height, wt, BP, etc) - record in vitals section for visit on day of visit Request home vitals (wt, BP, etc.) and enter into vitals, THEN update Vital Signs SmartPhrase below at the top of the HPI. See below.  2)Review and Update Medications, Allergies PMH, Surgeries, Social history in Epic 3)Hospitalizations in the last year with date/reason? none  4)Review and Update Care Team (patient's specialists) in Epic 5) Complete PHQ9 in Epic  6) Complete Fall Screening in Epic 7)Review all Health Maintenance Due and order under PCP if not done.  Medicare Wellness Patient Questionnaire:  Answer theses question about your habits: How often do you have a drink containing alcohol?n Have you ever smoked?n On average, how many days per week do you engage in moderate to strenuous exercise (like a brisk walk)? None, has RA, breathing issues, chronic resp failure Diet: trying to do low sodium, trying to do not many sweets and carbs  Beverages: mostly water  Answer theses question about your everyday activities: Can you perform most household chores?y Are you deaf or have significant trouble hearing?n Do you feel that you have a problem with memory?n Do you feel safe at home?y Last dentist visit? Has not done in the last few years 8. Do you have any difficulty performing your everyday activities?y Are you having any difficulty walking, taking medications on your own, and or difficulty managing daily home needs?y Do you have difficulty walking or climbing stairs?y Do you have difficulty dressing or bathing?y Do you have difficulty doing errands alone such as visiting a doctor's office or shopping?y Do you currently have any difficulty preparing food and eating?y Do you currently have any  difficulty using the toilet?y Do you have any difficulty managing your finances?n Do you have any difficulties with housekeeping of managing your housekeeping?y   Do you have Advanced Directives in place (Living Will, Healthcare Power or Attorney)? yes   Last eye Exam and location? Is supposed to have cataract surgery   Do you currently use prescribed or non-prescribed narcotic or opioid pain medications?n  Do you have a history or close family history of breast, ovarian, tubal or peritoneal cancer or a family member with BRCA (breast cancer susceptibility 1 and 2) gene mutations?    ----------------------------------------------------------------------------------------------------------------------------------------------------------------------------------------------------------------------  Because this visit was a virtual/telehealth visit, some criteria may be missing or patient reported. Any vitals not documented were not able to be obtained and vitals that have been documented are patient reported.    MEDICARE ANNUAL PREVENTIVE VISIT WITH PROVIDER: (Welcome to Medicare, initial annual wellness or annual wellness exam)  Virtual Visit via Video Note  I connected with [name] on 12/05/23 by a video enabled telemedicine application and verified that I am speaking with the correct person using two identifiers.  Location patient: home Location provider:work or home office Persons participating in the virtual visit: patient, provider  Concerns and/or follow up today: has RA and rheumatologist refused to continue her pain medication suddenly a few years ago and she is in a considerable amount of pain so does not get out of the house much.    See HM section in Epic for other details of completed HM.    ROS: negative for report of fevers, unintentional weight loss, vision changes, vision loss, hearing loss or change  Patient-completed extensive  health risk  assessment - reviewed and discussed with the patient: See Health Risk Assessment completed with patient prior to the visit either above or in recent phone note. This was reviewed in detailed with the patient today and appropriate recommendations, orders and referrals were placed as needed per Summary below and patient instructions.   Review of Medical History: -PMH, PSH, Family History and current specialty and care providers reviewed and updated and listed below   Patient Care Team: Shirline Frees, NP as PCP - General (Family Medicine) Jake Bathe, MD as PCP - Cardiology (Cardiology) Graylin Shiver, MD (Gastroenterology) Oretha Milch, MD as Consulting Physician (Pulmonary Disease) Zenovia Jordan, MD as Consulting Physician (Rheumatology)   Past Medical History:  Diagnosis Date   Anemia    Arthritis    hands, shoulders, no meds   CHF (congestive heart failure) (HCC)    EF60-65%   Chronic hyperventilation syndrome    w/ obesity tx with albuterol inhaler and oxygen 2L   COPD (chronic obstructive pulmonary disease) (HCC)    uses oxygen 2 L   Gallstones    GERD (gastroesophageal reflux disease)    diet controlled - no meds   H/O hiatal hernia    Hypertension    Hypothyroidism    Kidney stones    Morbid obesity (HCC)    Pneumonia    hoispitalized in 08/2011   Sarcoidosis    Seasonal allergies    Sleep apnea    uses CPAP machine     Past Surgical History:  Procedure Laterality Date   CESAREAN SECTION  1994   x 1   CHOLECYSTECTOMY  2000   DILATION AND CURETTAGE OF UTERUS N/A 08/09/2016   Procedure: DILATATION AND CURETTAGE;  Surgeon: Adolphus Birchwood, MD;  Location: [facility];  Service: Gynecology;  Laterality: N/A;   HYSTEROSCOPY WITH D & C  12/27/2011   Procedure: DILATATION AND CURETTAGE /HYSTEROSCOPY;  Surgeon: Dorien Chihuahua. Richardson Dopp, MD;  Location: [facility];  Service: Gynecology;;   I and D of abcess  05/2011   INTRAUTERINE DEVICE (IUD) INSERTION N/A 08/09/2016   Procedure:  INTRAUTERINE DEVICE (IUD) INSERTION;  Surgeon: Adolphus Birchwood, MD;  Location: [facility];  Service: Gynecology;  Laterality: N/A;   LUNG BIOPSY     uterine abletion      Social History   Socioeconomic History   Marital status: Single    Spouse name: Not on file   Number of children: 1   Years of education: 16   Highest education level: Bachelor's degree (e.g., BA, AB, BS)  Occupational History   Occupation: disability   Tobacco Use   Smoking status: Never   Smokeless tobacco: Never  Vaping Use   Vaping status: Never Used  Substance and Sexual Activity   Alcohol use: Yes    Comment: occasionally   Drug use: No   Sexual activity: Never    Birth control/protection: None  Other Topics Concern   Not on file  Social History Narrative   Son lives with patient.   Divorced for eight or nine years   disabled   Social Drivers of Corporate investment banker Strain: Low Risk  (11/22/2021)   Overall Financial Resource Strain (CARDIA)    Difficulty of Paying Living Expenses: Not hard at all  Food Insecurity: No Food Insecurity (11/02/2022)   Hunger Vital Sign    Worried About Running Out of Food in the Last Year: Never true    Ran Out of Food in the Last  Year: Never true  Transportation Needs: No Transportation Needs (11/02/2022)   PRAPARE - Administrator, Civil Service (Medical): No    Lack of Transportation (Non-Medical): No  Physical Activity: Inactive (11/22/2021)   Exercise Vital Sign    Days of Exercise per Week: 0 days    Minutes of Exercise per Session: 0 min  Stress: No Stress Concern Present (11/22/2021)   [employer] of Occupational Health - Occupational Stress Questionnaire    Feeling of Stress : Not at all  Social Connections: Moderately Isolated (11/22/2021)   Social Connection and Isolation Panel [NHANES]    Frequency of Communication with Friends and Family: More than three times a week    Frequency of Social Gatherings with Friends and Family: More than  three times a week    Attends Religious Services: Never    Database administrator or Organizations: Yes    Attends Banker Meetings: Not on file    Marital Status: Divorced  Intimate Partner Violence: Not At Risk (11/22/2021)   Humiliation, Afraid, Rape, and Kick questionnaire    Fear of Current or Ex-Partner: No    Emotionally Abused: No    Physically Abused: No    Sexually Abused: No    Family History  Problem Relation Age of Onset   Diabetes Father    Cancer Father 70       colon cancer    Diabetes Brother    Deep vein thrombosis Mother    Aneurysm Sister        d/o brain aneurysm    Current Outpatient Medications on File Prior to Visit  Medication Sig Dispense Refill   acetaminophen (TYLENOL) 500 MG tablet Take 1 tablet (500 mg total) by mouth every 6 (six) hours as needed. 30 tablet 0   albuterol (VENTOLIN HFA) 108 (90 Base) MCG/ACT inhaler INHALE 1-2 PUFFS BY MOUTH EVERY 6 HOURS AS NEEDED FOR WHEEZE OR SHORTNESS OF BREATH 8.5 each 3   atorvastatin (LIPITOR) 40 MG tablet TAKE 1 TABLET BY MOUTH DAILY AT 6 PM. 30 tablet 0   calcium citrate-vitamin D (CITRACAL+D) 315-200 MG-UNIT tablet Take 1 tablet by mouth daily.     cetirizine (ZYRTEC) 10 MG tablet Take 10 mg by mouth at bedtime as needed for allergies.     cyclobenzaprine (FLEXERIL) 10 MG tablet Take 1 tablet (10 mg total) by mouth 3 (three) times daily as needed for muscle spasms. 30 tablet 0   famotidine (PEPCID) 40 MG tablet TAKE 1 TABLET BY MOUTH TWICE A DAY (Patient taking differently: Take 40 mg by mouth 2 (two) times daily.) 60 tablet 0   folic acid (FOLVITE) 1 MG tablet Take 1 mg by mouth daily.  3   furosemide (LASIX) 20 MG tablet Take 20 mg by mouth 2 (two) times daily. Taking as needed d/t pain in knees/legs     metoprolol succinate (TOPROL-XL) 25 MG 24 hr tablet TAKE 1 TABLET (25 MG TOTAL) BY MOUTH DAILY. 30 tablet 0   NON FORMULARY 2 liter of oxygen     ondansetron (ZOFRAN) 4 MG tablet Take 1 tablet  (4 mg total) by mouth every 8 (eight) hours as needed for nausea or vomiting. 20 tablet 0   predniSONE (DELTASONE) 10 MG tablet Take 1 tablet (10 mg total) by mouth daily with breakfast. 20 tablet 4   triamcinolone ointment (KENALOG) 0.1 % Apply 1 application topically 2 (two) times daily. For Sarcoidosis     vitamin B-12 1000 MCG  tablet Take 1 tablet (1,000 mcg total) by mouth daily. 30 tablet 0   No current facility-administered medications on file prior to visit.    Allergies  Allergen Reactions   Tape Itching    EKG Leads cause Skin burns and irritation after 24 hours+       Physical Exam Vitals requested from patient and listed below if patient had equipment and was able to obtain at home for this virtual visit: Vitals:   12/05/23 1659  BP: (!) 159/88  Pulse: 87  SpO2: 96%   Estimated body mass index is 74.76 kg/m as calculated from the following:   Height as of 10/29/22: 5\' 6"  (1.676 m).   Weight as of 12/01/22: 463 lb 3 oz (210.1 kg).  EKG (optional): deferred due to virtual visit  GENERAL: alert, oriented, no acute distress detected, full vision exam deferred due to pandemic and/or virtual encounter  HEENT: atraumatic, conjunttiva clear, no obvious abnormalities on inspection of external nose and ears  NECK: normal movements of the head and neck  LUNGS: On O2 by [place]  CV: no obvious cyanosis  PSYCH/NEURO: pleasant and cooperative, no obvious depression or anxiety, speech and thought processing grossly intact, Cognitive function grossly intact  Flowsheet Row Video Visit from 12/01/2022 in [facility] [place]  PHQ-9 Total Score 2           12/05/2023    5:19 PM 12/01/2022    2:14 PM 11/09/2022   11:04 AM 11/22/2021    8:22 AM 12/04/2020    9:54 AM  Depression screen PHQ 2/9  Decreased Interest 0 0 0 0 0  Down, Depressed, Hopeless 0 0 0 0 0  PHQ - 2 Score 0 0 0 0 0  Altered sleeping  1   1  Tired, decreased energy  0   3  Change in  appetite  1   0  Feeling bad or failure about yourself   0   0  Trouble concentrating  0   0  Moving slowly or fidgety/restless  0   0  Suicidal thoughts  0   0  PHQ-9 Score  2   4  Difficult doing work/chores  Not difficult at all   Not difficult at all       10/30/2022   11:00 PM 10/31/2022    8:00 AM 10/31/2022    8:00 PM 12/01/2022    2:16 PM 12/05/2023    5:18 PM  Fall Risk  Falls in the past year?    0 0  Was there an injury with Fall?    0 0  Fall Risk Category Calculator    0 0  (RETIRED) Patient Fall Risk Level Moderate fall risk Moderate fall risk Moderate fall risk    Patient at Risk for Falls Due to    No Fall Risks   Fall risk Follow up    Falls evaluation completed      SUMMARY AND PLAN:  Encounter for Medicare annual wellness exam   Discussed applicable health maintenance/preventive health measures and advised and referred or ordered per patient preferences: -declines all preventive care as does not wish to leave the house currently -reviewed all measures and advised, she declines and does not have further questions Health Maintenance  Topic Date Due   Hepatitis C Screening  Never done   DTaP/Tdap/Td (1 - Tdap) Never done   Colonoscopy  Never done   Cervical Cancer Screening (HPV/Pap Cotest)  12/26/2014  MAMMOGRAM  Never done   Pneumococcal Vaccine 1-37 Years old (2 of 2 - PCV) 08/18/2020   COLON CANCER SCREENING ANNUAL FOBT  08/10/2021   Zoster Vaccines- Shingrix (2 of 2) 02/08/2022   COVID-19 Vaccine (4 - 2024-25 season) 07/02/2023   Medicare Annual Wellness (AWV)  12/04/2024   HIV Screening  Completed   HPV VACCINES  Aged [employer] and counseling on the following was provided based on the above review of health and a plan/checklist for the patient, along with additional information discussed, was provided for the patient in the patient instructions :  -discussed current state and advised close follow up with PCP to address pain and elevated  BP, she declines inperson visit so sent message to schedulers to request virtual visit with [facility] and sent him a detailed message. Also sent message to schedulers to assist in scheduling. Advised pt to call the office if she is not contacted in the next 1-2 days for an appt.  -offered PT/CM referrals, but she does not feel can do PT at this time or that she needs CM -advised of elevated BP, BP goals and discussed dietary measures to assist and advised close follow up with PCP -Provided safe balance exercises that can be done at home to improve balance and discussed exercise guidelines for adults with include balance exercises at least 3 days per week.  See pt instructions -Advised and counseled on a healthy lifestyle - including the importance of a healthy diet, regular physical activity, social connections and stress management. -Reviewed patient's current diet. Advised and counseled on a whole foods based healthy diet. A summary of a healthy diet was provided in the Patient Instructions.  -reviewed patient's current physical activity level and provided exercise guidelines for adults. She is very limited at the moment. Advised close follow up with PCP to discuss options for pain and advised possible chair exercises. I think PT would be great, but she does not feel can without pain medication. She declines referral to pain management.  -Advise yearly dental visits at minimum and regular eye exams -Advised and counseled on opoid use risks and benefits.   Follow up: see patient instructions     Patient Instructions  I really enjoyed getting to talk with you today! I am available on Tuesdays and Thursdays for virtual visits if you have any questions or concerns, or if I can be of any further assistance.   CHECKLIST FROM ANNUAL WELLNESS VISIT:  -Follow up (please call to schedule if not scheduled after visit):   -Follow up with [facility] ASAP in the next few weeks for blood pressure, chronic pain and  regular follow up  Here is a list of your preventive care/health maintenance measures and the plan for each if any are due:  PLAN For any measures below that may be due:  -vaccines can be obtained at pharmacy if you decide to get them -If you decide to do the colon, cervical or breast cancer screening or any of the other measures, please let us know   Health Maintenance  Topic Date Due   Hepatitis C Screening  Never done   DTaP/Tdap/Td (1 - Tdap) Never done   Colonoscopy  Never done   Cervical Cancer Screening (HPV/Pap Cotest)  12/26/2014   MAMMOGRAM  Never done   Pneumococcal Vaccine 26-6 Years old (2 of 2 - PCV) 08/18/2020   COLON CANCER SCREENING ANNUAL FOBT  08/10/2021   Zoster Vaccines- Shingrix (2 of  2) 02/08/2022   COVID-19 Vaccine (4 - 2024-25 season) 07/02/2023   Medicare Annual Wellness (AWV)  12/04/2024   HIV Screening  Completed   HPV VACCINES  Aged Out    -See a dentist at least yearly  -Get your eyes checked and then per your eye specialist's recommendations  -Other issues addressed today:   -need follow up with Kandee Keen to address the blood pressure and the pain and to follow up  -avoid salt and sodium in food and sweeteners, avoid processed grains and processed foods  -see below for details of a healthy diet to help with inflammation, pain and blood pressure   -I have included below further information regarding a healthy whole foods based diet, physical activity guidelines for adults, stress management and opportunities for social connections. I hope you find this information useful.   -----------------------------------------------------------------------------------------------------------------------------------------------------------------------------------------------------------------------------------------------------------    NUTRITION: -eat real food: lots of colorful vegetables (half the plate) and fruits -5-7 servings of vegetables and fruits per  day (fresh or steamed is best), exp. 2 servings of vegetables with lunch and dinner and 2 servings of fruit per day. Berries and greens such as kale and collards are great choices.  -consume on a regular basis:  fresh fruits, fresh veggies, fish, nuts, seeds, healthy oils (such as olive oil, avocado oil), whole grains (make sure first ingredient on label contains the word "whole") -can eat small amounts of dairy and lean meat (no larger than the palm of your hand), but avoid processed meats such as ham, bacon, lunch meat, etc. -drink water -try to avoid fast food and pre-packaged foods, processed meat, ultra processed foods (donuts, candy, etc.) -most experts advise limiting sodium to < 2300mg  per day, should limit further is any chronic conditions such as high blood pressure, heart disease, diabetes, etc. The American Heart Association advised that < 1500mg  is is ideal -try to avoid foods that contain any ingredients with names you do not recognize  -try to avoid foods with added sugar or sweeteners/sweets  -try to avoid sweet drinks -try to avoid white rice, white bread, pasta (unless whole grain)  EXERCISE GUIDELINES FOR ADULTS: -if you wish to increase your physical activity, do so gradually and with the approval of your doctor -STOP and seek medical care immediately if you have any chest pain, chest discomfort or trouble breathing when starting or increasing exercise  -move and stretch your body, legs, feet and arms when sitting for long periods -Physical activity guidelines for optimal health in adults: -get at least 150 minutes per week of moderate exercise (can talk, but not sing); this is about 20-30 minutes of sustained activity 5-7 days per week or two 10-15 minute episodes of sustained activity 5-7 days per week -do some muscle building/resistance training at least 2 days per week  -balance exercises 3+ days per week:   Stand somewhere where you have something sturdy to hold onto if  you lose balance.    1) lift up on toes, start with 5x per day and work up to 20x   2) stand and lift on leg straight out to the side so that foot is a few inches of the floor, start with 5x each side and work up to 20x each side   3) stand on one foot, start with 5 seconds each side and work up to 20 seconds on each side  If you need ideas or help with getting more active:  -Silver sneakers https://tools.silversneakers.com  -Walk with a Doc: http://www.duncan-williams.com/  -  try to include resistance (weight lifting/strength building) and balance exercises twice per week: or the following link for ideas: http://castillo-powell.com/  BuyDucts.dk  STRESS MANAGEMENT: -can try meditating, or just sitting quietly with deep breathing while intentionally relaxing all parts of your body for 5 minutes daily -if you need further help with stress, anxiety or depression please follow up with your primary doctor or contact the wonderful folks at [facility]: [phone]  SOCIAL CONNECTIONS: -options in [place] if you wish to engage in more social and exercise related activities:  -Silver sneakers https://tools.silversneakers.com  -Walk with a Doc: http://www.duncan-williams.com/  -Check out the [facility] Active Adults 50+ section on the [place] [employer] (hiking clubs, book clubs, cards and games, chess, exercise classes, aquatic classes and much more) - see the website for details: https://www.[place].gov/departments/parks-recreation/active-adults50  -YouTube has lots of exercise videos for different ages and abilities as well  -[facility] (a variety of indoor and outdoor inperson activities for adults). 701-245-1299. [address].  -Virtual Online Classes (a variety of topics): see seniorplanet.org or call [phone]  -consider volunteering at a school,  hospice center, church, senior center or elsewhere            Terressa Koyanagi, DO

## 2023-12-05 NOTE — Patient Instructions (Signed)
I really enjoyed getting to talk with you today! I am available on Tuesdays and Thursdays for virtual visits if you have any questions or concerns, or if I can be of any further assistance.   CHECKLIST FROM ANNUAL WELLNESS VISIT:  -Follow up (please call to schedule if not scheduled after visit):   -Follow up with [facility] ASAP in the next few weeks for blood pressure, chronic pain and regular follow up  Here is a list of your preventive care/health maintenance measures and the plan for each if any are due:  PLAN For any measures below that may be due:  -vaccines can be obtained at pharmacy if you decide to get them -If you decide to do the colon, cervical or breast cancer screening or any of the other measures, please let us know   Health Maintenance  Topic Date Due   Hepatitis C Screening  Never done   DTaP/Tdap/Td (1 - Tdap) Never done   Colonoscopy  Never done   Cervical Cancer Screening (HPV/Pap Cotest)  12/26/2014   MAMMOGRAM  Never done   Pneumococcal Vaccine 49-81 Years old (2 of 2 - PCV) 08/18/2020   COLON CANCER SCREENING ANNUAL FOBT  08/10/2021   Zoster Vaccines- Shingrix (2 of 2) 02/08/2022   COVID-19 Vaccine (4 - 2024-25 season) 07/02/2023   Medicare Annual Wellness (AWV)  12/04/2024   HIV Screening  Completed   HPV VACCINES  Aged Out    -See a dentist at least yearly  -Get your eyes checked and then per your eye specialist's recommendations  -Other issues addressed today:   -need follow up with Kandee Keen to address the blood pressure and the pain and to follow up  -avoid salt and sodium in food and sweeteners, avoid processed grains and processed foods  -see below for details of a healthy diet to help with inflammation, pain and blood pressure   -I have included below further information regarding a healthy whole foods based diet, physical activity guidelines for adults, stress management and opportunities for social connections. I hope you find this information useful.    -----------------------------------------------------------------------------------------------------------------------------------------------------------------------------------------------------------------------------------------------------------    NUTRITION: -eat real food: lots of colorful vegetables (half the plate) and fruits -5-7 servings of vegetables and fruits per day (fresh or steamed is best), exp. 2 servings of vegetables with lunch and dinner and 2 servings of fruit per day. Berries and greens such as kale and collards are great choices.  -consume on a regular basis:  fresh fruits, fresh veggies, fish, nuts, seeds, healthy oils (such as olive oil, avocado oil), whole grains (make sure first ingredient on label contains the word "whole") -can eat small amounts of dairy and lean meat (no larger than the palm of your hand), but avoid processed meats such as ham, bacon, lunch meat, etc. -drink water -try to avoid fast food and pre-packaged foods, processed meat, ultra processed foods (donuts, candy, etc.) -most experts advise limiting sodium to < 2300mg  per day, should limit further is any chronic conditions such as high blood pressure, heart disease, diabetes, etc. The American Heart Association advised that < 1500mg  is is ideal -try to avoid foods that contain any ingredients with names you do not recognize  -try to avoid foods with added sugar or sweeteners/sweets  -try to avoid sweet drinks -try to avoid white rice, white bread, pasta (unless whole grain)  EXERCISE GUIDELINES FOR ADULTS: -if you wish to increase your physical activity, do so gradually and with the approval of your doctor -STOP and  seek medical care immediately if you have any chest pain, chest discomfort or trouble breathing when starting or increasing exercise  -move and stretch your body, legs, feet and arms when sitting for long periods -Physical activity guidelines for optimal health in adults: -get at  least 150 minutes per week of moderate exercise (can talk, but not sing); this is about 20-30 minutes of sustained activity 5-7 days per week or two 10-15 minute episodes of sustained activity 5-7 days per week -do some muscle building/resistance training at least 2 days per week  -balance exercises 3+ days per week:   Stand somewhere where you have something sturdy to hold onto if you lose balance.    1) lift up on toes, start with 5x per day and work up to 20x   2) stand and lift on leg straight out to the side so that foot is a few inches of the floor, start with 5x each side and work up to 20x each side   3) stand on one foot, start with 5 seconds each side and work up to 20 seconds on each side  If you need ideas or help with getting more active:  -Silver sneakers https://tools.silversneakers.com  -Walk with a Doc: http://www.duncan-williams.com/  -try to include resistance (weight lifting/strength building) and balance exercises twice per week: or the following link for ideas: http://castillo-powell.com/  BuyDucts.dk  STRESS MANAGEMENT: -can try meditating, or just sitting quietly with deep breathing while intentionally relaxing all parts of your body for 5 minutes daily -if you need further help with stress, anxiety or depression please follow up with your primary doctor or contact the wonderful folks at [facility]: [phone]  SOCIAL CONNECTIONS: -options in [place] if you wish to engage in more social and exercise related activities:  -Silver sneakers https://tools.silversneakers.com  -Walk with a Doc: http://www.duncan-williams.com/  -Check out the [facility] Active Adults 50+ section on the [place] [employer] (hiking clubs, book clubs, cards and games, chess, exercise classes, aquatic classes and much more) - see the website for  details: https://www.[place].gov/departments/parks-recreation/active-adults50  -YouTube has lots of exercise videos for different ages and abilities as well  -[facility] (a variety of indoor and outdoor inperson activities for adults). [phone]. [address]  -Virtual Online Classes (a variety of topics): see seniorplanet.org or call [phone]  -consider volunteering at a school, hospice center, church, senior center or elsewhere

## 2023-12-14 ENCOUNTER — Encounter: Payer: Self-pay | Admitting: Adult Health

## 2023-12-14 ENCOUNTER — Telehealth: Payer: 59 | Admitting: Adult Health

## 2023-12-14 VITALS — BP 156/89 | HR 88 | Ht 66.0 in | Wt >= 6400 oz

## 2023-12-14 DIAGNOSIS — I10 Essential (primary) hypertension: Secondary | ICD-10-CM | POA: Diagnosis not present

## 2023-12-14 DIAGNOSIS — M150 Primary generalized (osteo)arthritis: Secondary | ICD-10-CM | POA: Diagnosis not present

## 2023-12-14 DIAGNOSIS — R2681 Unsteadiness on feet: Secondary | ICD-10-CM | POA: Diagnosis not present

## 2023-12-14 DIAGNOSIS — M1990 Unspecified osteoarthritis, unspecified site: Secondary | ICD-10-CM

## 2023-12-14 MED ORDER — DULOXETINE HCL 30 MG PO CPEP
30.0000 mg | ORAL_CAPSULE | Freq: Every day | ORAL | 0 refills | Status: DC
Start: 2023-12-14 — End: 2024-01-09

## 2023-12-14 NOTE — Progress Notes (Signed)
Virtual Visit via Video Note  I connected with [name] on 12/14/23 at  4:00 PM EST by a video enabled telemedicine application and verified that I am speaking with the correct person using two identifiers.  Location patient: home Location provider:work or home office Persons participating in the virtual visit: patient, provider  I discussed the limitations of evaluation and management by telemedicine and the availability of in person appointments. The patient expressed understanding and agreed to proceed.   HPI: She is being evaluated today for follow-up guarding pain management and hypertension.  She recently had her annual wellness exam and she explained to the annual wellness physician that has not been able to get into see her care team for over a year due to chronic pain from OA and inflammatory arthritis, that was managed by rheumatology in the past with methotrexate and prednisone.  Since she is always in pain she does not leave the house let alone in her bedroom most days and is having a hard time performing her ADLs.  Was also noted that her blood pressure was elevated, she does take metoprolol 25 mg extended release but does not take it on a daily basis.  She checks her blood pressure after she is done dressing and getting ready in the morning and her blood pressures are usually elevated into the 150s at this time.  She has not been checking any other times of the day.  She would also like a raised toilet seat as her toilet seat sits lower and she has a hard time getting up off the toilet due to chronic joint pain and obesity.  She would also like to have a wheelchair that her son can use to maneuver her around the house and hopefully get her to her appointments.  ROS: See pertinent positives and negatives per HPI.  Past Medical History:  Diagnosis Date   Anemia    Arthritis    hands, shoulders, no meds   CHF (congestive heart failure) (HCC)    EF60-65%   Chronic  hyperventilation syndrome    w/ obesity tx with albuterol inhaler and oxygen 2L   COPD (chronic obstructive pulmonary disease) (HCC)    uses oxygen 2 L   Gallstones    GERD (gastroesophageal reflux disease)    diet controlled - no meds   H/O hiatal hernia    Hypertension    Hypothyroidism    Kidney stones    Morbid obesity (HCC)    Pneumonia    hoispitalized in 08/2011   Sarcoidosis    Seasonal allergies    Sleep apnea    uses CPAP machine     Past Surgical History:  Procedure Laterality Date   CESAREAN SECTION  1994   x 1   CHOLECYSTECTOMY  2000   DILATION AND CURETTAGE OF UTERUS N/A 08/09/2016   Procedure: DILATATION AND CURETTAGE;  Surgeon: Adolphus Birchwood, MD;  Location: [facility];  Service: Gynecology;  Laterality: N/A;   HYSTEROSCOPY WITH D & C  12/27/2011   Procedure: DILATATION AND CURETTAGE /HYSTEROSCOPY;  Surgeon: Dorien Chihuahua. Richardson Dopp, MD;  Location: [facility];  Service: Gynecology;;   I and D of abcess  05/2011   INTRAUTERINE DEVICE (IUD) INSERTION N/A 08/09/2016   Procedure: INTRAUTERINE DEVICE (IUD) INSERTION;  Surgeon: Adolphus Birchwood, MD;  Location: [facility];  Service: Gynecology;  Laterality: N/A;   LUNG BIOPSY     uterine abletion      Family History  Problem Relation Age of Onset  Diabetes Father    Cancer Father 33       colon cancer    Diabetes Brother    Deep vein thrombosis Mother    Aneurysm Sister        d/o brain aneurysm       Current Outpatient Medications:    acetaminophen (TYLENOL) 500 MG tablet, Take 1 tablet (500 mg total) by mouth every 6 (six) hours as needed., Disp: 30 tablet, Rfl: 0   albuterol (VENTOLIN HFA) 108 (90 Base) MCG/ACT inhaler, INHALE 1-2 PUFFS BY MOUTH EVERY 6 HOURS AS NEEDED FOR WHEEZE OR SHORTNESS OF BREATH, Disp: 8.5 each, Rfl: 3   atorvastatin (LIPITOR) 40 MG tablet, TAKE 1 TABLET BY MOUTH DAILY AT 6 PM., Disp: 30 tablet, Rfl: 0   calcium citrate-vitamin D (CITRACAL+D) 315-200 MG-UNIT tablet, Take 1 tablet by mouth daily., Disp: , Rfl:     cetirizine (ZYRTEC) 10 MG tablet, Take 10 mg by mouth at bedtime as needed for allergies., Disp: , Rfl:    cyclobenzaprine (FLEXERIL) 10 MG tablet, Take 1 tablet (10 mg total) by mouth 3 (three) times daily as needed for muscle spasms., Disp: 30 tablet, Rfl: 0   famotidine (PEPCID) 40 MG tablet, TAKE 1 TABLET BY MOUTH TWICE A DAY (Patient taking differently: Take 40 mg by mouth 2 (two) times daily.), Disp: 60 tablet, Rfl: 0   folic acid (FOLVITE) 1 MG tablet, Take 1 mg by mouth daily., Disp: , Rfl: 3   furosemide (LASIX) 20 MG tablet, Take 20 mg by mouth 2 (two) times daily. Taking as needed d/t pain in knees/legs, Disp: , Rfl:    levothyroxine (SYNTHROID) 100 MCG tablet, Orally, Disp: , Rfl:    metoprolol succinate (TOPROL-XL) 25 MG 24 hr tablet, TAKE 1 TABLET (25 MG TOTAL) BY MOUTH DAILY., Disp: 30 tablet, Rfl: 0   NON FORMULARY, 2 liter of oxygen, Disp: , Rfl:    ondansetron (ZOFRAN) 4 MG tablet, Take 1 tablet (4 mg total) by mouth every 8 (eight) hours as needed for nausea or vomiting., Disp: 20 tablet, Rfl: 0   predniSONE (DELTASONE) 10 MG tablet, Take 1 tablet (10 mg total) by mouth daily with breakfast., Disp: 20 tablet, Rfl: 4   triamcinolone ointment (KENALOG) 0.1 %, Apply 1 application topically 2 (two) times daily. For Sarcoidosis, Disp: , Rfl:    TUBERCULIN SYR 1CC/27GX1/2" (B-D TB SYRINGE 1CC/27GX1/2") 27G X 1/2" 1 ML MISC, USE ONCE A WEEK TO INJECT METHOTREXATE for 90 days, Disp: , Rfl:    vitamin B-12 1000 MCG tablet, Take 1 tablet (1,000 mcg total) by mouth daily., Disp: 30 tablet, Rfl: 0  EXAM:  VITALS per patient if applicable:  GENERAL: alert, oriented, appears well and in no acute distress  HEENT: atraumatic, conjunttiva clear, no obvious abnormalities on inspection of external nose and ears  NECK: normal movements of the head and neck  LUNGS: on inspection no signs of respiratory distress, breathing rate appears normal, no obvious gross SOB, gasping or wheezing  CV: no  obvious cyanosis  MS: moves all visible extremities without noticeable abnormality  PSYCH/NEURO: pleasant and cooperative, no obvious depression or anxiety, speech and thought processing grossly intact  ASSESSMENT AND PLAN:  Discussed the following assessment and plan:  1. Primary hypertension (Primary) - I would like her to check her BP multiples times a day and take her medication daily.   2. Chronic inflammatory arthritis - Will trial Cymbalta. Follow up in one month  - DULoxetine (CYMBALTA) 30 MG  capsule; Take 1 capsule (30 mg total) by mouth daily.  Dispense: 30 capsule; Refill: 0  3. Primary osteoarthritis involving multiple joints  - DULoxetine (CYMBALTA) 30 MG capsule; Take 1 capsule (30 mg total) by mouth daily.  Dispense: 30 capsule; Refill: 0  4. Gait instability  - Elevated toilet seat - DME Wheelchair manual  5. Morbid obesity (HCC)  - Elevated toilet seat - DME Wheelchair manual     I discussed the assessment and treatment plan with the patient. The patient was provided an opportunity to ask questions and all were answered. The patient agreed with the plan and demonstrated an understanding of the instructions.   The patient was advised to call back or seek an in-person evaluation if the symptoms worsen or if the condition fails to improve as anticipated.   Shirline Frees, NP   Time spent with patient today was 45 minutes which consisted of chart review, discussing HTN, Pain management, morbid obesity and gait instability, work up, treatment answering questions and documentation.

## 2023-12-17 ENCOUNTER — Encounter: Payer: Self-pay | Admitting: Adult Health

## 2023-12-19 NOTE — Telephone Encounter (Signed)
 Please advise

## 2023-12-30 DIAGNOSIS — G4733 Obstructive sleep apnea (adult) (pediatric): Secondary | ICD-10-CM | POA: Diagnosis not present

## 2024-01-08 ENCOUNTER — Other Ambulatory Visit: Payer: Self-pay | Admitting: Adult Health

## 2024-01-08 DIAGNOSIS — M150 Primary generalized (osteo)arthritis: Secondary | ICD-10-CM

## 2024-01-08 DIAGNOSIS — M1990 Unspecified osteoarthritis, unspecified site: Secondary | ICD-10-CM

## 2024-02-11 DIAGNOSIS — G4733 Obstructive sleep apnea (adult) (pediatric): Secondary | ICD-10-CM | POA: Diagnosis not present

## 2024-02-14 DIAGNOSIS — G4733 Obstructive sleep apnea (adult) (pediatric): Secondary | ICD-10-CM | POA: Diagnosis not present

## 2024-03-22 ENCOUNTER — Other Ambulatory Visit: Payer: Self-pay | Admitting: Adult Health

## 2024-03-22 NOTE — Telephone Encounter (Signed)
 Patient need to schedule in office visit for more refills.

## 2024-03-27 ENCOUNTER — Ambulatory Visit: Admitting: Adult Health

## 2024-03-31 DIAGNOSIS — G4733 Obstructive sleep apnea (adult) (pediatric): Secondary | ICD-10-CM | POA: Diagnosis not present

## 2024-05-15 DIAGNOSIS — G4733 Obstructive sleep apnea (adult) (pediatric): Secondary | ICD-10-CM | POA: Diagnosis not present

## 2024-06-18 DIAGNOSIS — G4733 Obstructive sleep apnea (adult) (pediatric): Secondary | ICD-10-CM | POA: Diagnosis not present

## 2024-08-19 NOTE — Progress Notes (Signed)
 MADDUX FIRST                                          MRN: [id number]   08/19/2024   The VBCI Quality Team Specialist reviewed this patient medical record for the purposes of chart review for care gap closure. The following were reviewed: chart review for care gap closure-breast cancer screening and colorectal cancer screening.    VBCI Quality Team

## 2024-10-17 ENCOUNTER — Telehealth: Payer: Self-pay | Admitting: *Deleted

## 2024-10-17 NOTE — Telephone Encounter (Signed)
 Copied from CRM [id number]. Topic: Clinical - Request for Lab/Test Order >> Oct 17, 2024 10:56 AM Burnard DEL wrote: Reason for CRM: Patient called in stating that provider is requiring her to come into the office to get refills on her medications.Patient called in stating that she has mobility issue. She stated that provider is stating that she needs labs done. She called her insurance company and they told her that she can get home draws from [facility] on demand that will come to her home. She stated that lab orders needs to be sent to [facility]. She has reached out to [facility] and they told her that her PCP has to set all of this up.

## 2024-10-17 NOTE — Telephone Encounter (Signed)
 Please advise

## 2024-10-18 NOTE — Telephone Encounter (Signed)
 Advised pt of update and she stated that it need to go to [facility]. Pt is not sure how it is set up and I advised this is the first time we have used this. I found a number for [facility] [phone] on line and will try to get answers.

## 2024-10-18 NOTE — Addendum Note (Signed)
 Addended by: VICCI LEADER R on: 10/18/2024 05:18 PM   Modules accepted: Orders

## 2024-10-18 NOTE — Telephone Encounter (Signed)
 Spoke to [employer] and they stated this is done through insurance and a 3rd party. They stated that labs need to be placed and pt need paper copy of labs at pt home. Pt notified of update but stated that insurance claimed that they can call us  to get what labs need to be drawn. Will place labs.

## 2024-11-08 ENCOUNTER — Telehealth: Payer: Self-pay

## 2024-11-08 NOTE — Telephone Encounter (Signed)
 Copied from CRM [id number]. Topic: Clinical - Order For Equipment >> Nov 07, 2024  2:55 PM Devaughn RAMAN wrote: Reason for CRM: Pt called regarding a message from [employer], pt stated [employer] needs a new order  because her machine is over 60 years old, Pt stated she is on her last cannulae and they last a week and she has already used it for a week, pt stated [employer] sent over a request to the office for a new order, [employer] sent info to the office to renew her oxygen  concentrator order and they stated office never responded and they are awaiting response for the new order for the pt.   Spoke with patient VBU, has not been seen for over a year over due f/u, she has been schedule and I did explain that she will have to be seen for us  to place order because DME will want those office note

## 2024-11-21 ENCOUNTER — Ambulatory Visit: Payer: Self-pay

## 2024-11-21 NOTE — Telephone Encounter (Signed)
 FYI Only or Action Required?: Action required by provider: request for appointment and update on patient condition.  Patient is followed in Pulmonology for chronic respiratory failure, last seen on 06/08/2023 by Parrett, Madelin RAMAN, NP.  Called Nurse Triage reporting Shortness of Breath.- no SOB during call. Needs orders for DME  Symptoms began n/a.  Interventions attempted: Home oxygen  use.  Symptoms are: unchanged.  Triage Disposition:   Patient/caregiver understands and will follow disposition?: Unsure  Message from Layton G sent at 11/21/2024  4:06 PM EST  Reason for Triage: breathing shortness of breath because of equipment is dirty   Reason for Disposition  [1] MILD longstanding difficulty breathing (e.g., minimal/no SOB at rest, SOB with walking, pulse < 100) AND [2] SAME as normal  Answer Assessment - Initial Assessment Questions Patient refuses triage. States that she wants to talk to a doctor Patient has not been seen in office since 2021, all appts have been virtual or by phone. Patient unable to travel to office due to mobility issues  Patient needs updated orders for DME specifically for nasal cannulas. She states that her cannulas are dirty  Unable to schedule virtual appointment, virtual appointments have been canceled without consulting patient  Patient experienced no shortness of breath or emergency symptoms during call  Protocols used: Breathing Difficulty-A-AH

## 2024-11-22 ENCOUNTER — Ambulatory Visit: Admitting: Adult Health

## 2024-11-22 ENCOUNTER — Telehealth: Payer: Self-pay | Admitting: Adult Health

## 2024-11-22 NOTE — Telephone Encounter (Signed)
 Pt is scheduled on 11-27-24 to see Madelin Stank, NP. NFN

## 2024-11-22 NOTE — Telephone Encounter (Signed)
 If she is homebound, please call DME and let them know . What documentation will they accept. Home health RN evaluation with documentation.

## 2024-11-22 NOTE — Telephone Encounter (Signed)
 Copied from CRM #[id number]. Topic: Appointments - Scheduling Inquiry for Clinic >> Nov 21, 2024 11:23 AM Dedra B wrote: Reason for CRM: Patient said she originally made a virtual appt for 1/23 but it's showing as in person. I rescheduled to virtual until I realized that she needed oxygen  recertification and switched it back to in person. Patient said she has mobility issues and can't come in for a visit. Please call patient. Patient said she will call back in 1 hour if she hasn't heard anything. Attempted to call [facility] and no answer. >> Nov 21, 2024  4:05 PM Rozanna G wrote: Pt is calling back wanting a video visit, stated she has been waiting for someone to call her back about this now stating she is having breathing issues.   Pt needs oxygen . Can this still be virtual?

## 2024-11-22 NOTE — Telephone Encounter (Signed)
 Called and spoke with the pt and she got upset stating she has spoken on the phone with multiple people and we aren't taking her needs into consideration.  She states she is a bariatric pt, and needs oxygen  qualification but she does not want to come into the office.  Tammy can you please advise any recommendations.

## 2024-11-26 NOTE — Telephone Encounter (Signed)
 Called and spoke to Ozell at [employer] in [place].   Per [employer], we would just need to document that pt's sats are less than 88% at rest on RA.  I asked what would be the alternative if pt's oxygen  is not less than 88% on RA. Michael from [employer] states he would speak with his lead and call back.

## 2024-11-27 ENCOUNTER — Telehealth: Admitting: Adult Health

## 2024-11-27 ENCOUNTER — Encounter: Payer: Self-pay | Admitting: Adult Health

## 2024-11-27 ENCOUNTER — Ambulatory Visit: Payer: Self-pay

## 2024-11-27 ENCOUNTER — Ambulatory Visit: Admitting: Adult Health

## 2024-11-27 VITALS — Ht 66.0 in | Wt >= 6400 oz

## 2024-11-27 VITALS — Temp 101.6°F | Wt >= 6400 oz

## 2024-11-27 DIAGNOSIS — J069 Acute upper respiratory infection, unspecified: Secondary | ICD-10-CM | POA: Diagnosis not present

## 2024-11-27 DIAGNOSIS — J9610 Chronic respiratory failure, unspecified whether with hypoxia or hypercapnia: Secondary | ICD-10-CM

## 2024-11-27 DIAGNOSIS — N3000 Acute cystitis without hematuria: Secondary | ICD-10-CM | POA: Diagnosis not present

## 2024-11-27 DIAGNOSIS — R5381 Other malaise: Secondary | ICD-10-CM

## 2024-11-27 DIAGNOSIS — D869 Sarcoidosis, unspecified: Secondary | ICD-10-CM

## 2024-11-27 DIAGNOSIS — R112 Nausea with vomiting, unspecified: Secondary | ICD-10-CM | POA: Diagnosis not present

## 2024-11-27 DIAGNOSIS — J9611 Chronic respiratory failure with hypoxia: Secondary | ICD-10-CM

## 2024-11-27 DIAGNOSIS — G4733 Obstructive sleep apnea (adult) (pediatric): Secondary | ICD-10-CM | POA: Diagnosis not present

## 2024-11-27 DIAGNOSIS — Z6841 Body Mass Index (BMI) 40.0 and over, adult: Secondary | ICD-10-CM

## 2024-11-27 DIAGNOSIS — E662 Morbid (severe) obesity with alveolar hypoventilation: Secondary | ICD-10-CM

## 2024-11-27 DIAGNOSIS — D860 Sarcoidosis of lung: Secondary | ICD-10-CM

## 2024-11-27 MED ORDER — ONDANSETRON HCL 4 MG PO TABS: 4.0000 mg | ORAL_TABLET | Freq: Three times a day (TID) | ORAL | 0 refills | Status: AC | PRN

## 2024-11-27 MED ORDER — AMOXICILLIN-POT CLAVULANATE 875-125 MG PO TABS: 1.0000 | ORAL_TABLET | Freq: Two times a day (BID) | ORAL | 0 refills | Status: AC

## 2024-11-27 MED ORDER — PREDNISONE 10 MG PO TABS: 10.0000 mg | ORAL_TABLET | Freq: Every day | ORAL | 5 refills | Status: AC

## 2024-11-27 NOTE — Progress Notes (Signed)
 Virtual Visit via Video Note  I connected with [name] on 11/27/24 at 11:00 AM EST by a video enabled telemedicine application and verified that I am speaking with the correct person using two identifiers.  Location: Patient: Home  Provider: Office    I discussed the limitations of evaluation and management by telemedicine and the availability of in person appointments. The patient expressed understanding and agreed to proceed.  History of Present Illness: Discussed the use of AI scribe software for clinical note transcription with the patient, who gave verbal consent to proceed.  History of Present Illness [name] is a 59 year old female with OHS/oSA and presumed sarcoidosis (bilateral pulmonary infiltrates/hilar/mediastinal lymphadenopathy, skin and joint involvement) who presents for a follow up. Last seen 2024. Medical history significant for morbid obesity with BMI at 77. She is homebound/bed bound. She is chronic steroids with prednisone  10mg  daily.   She is currently using oxygen  at two liters per minute. Her durable medical equipment company has informed her that her equipment, which she has had for five years, needs replacement. She is required to do a O2 qualification test today.  She is bed bound and unable to leave the home. Has an aide that comes in daily to help he. . She has a pulse oximeter at home, and her oxygen  saturation is 97% with oxygen  at 2l/m . On Room air O2 sats 88% on room air.   She is on prednisone  10 mg daily for sarcoidosis and confirms she is still taking it. She also uses a BiPAP machine at bedtime, but there is an issue with obtaining a download due to a missing serial number with DME ,. Over the past three days, she has experienced episodes of feeling cold and then hot, with her temperature reaching 102F, which she managed with Tylenol . No cough, but her urine is darker than usual. She has been drinking fluids but has had  difficulty eating over the past few days. No congestion . Has not tested for flu or covid   She is able to get up and use the bedside toilet only . \    Observations/Objective: In bed on O2 at 2l/m  O2 sats on room air 88%, 2l/m 97% (home pulse oximeter)  Assessment and Plan: Sarcoid appears stable. -on chronic steroids for may years. Continue on Prednisone  10mg  daily .   Severe deconditioning with Morbid obesity with BMI 77- bed /home bound. Continue home care .   OSA/OHS -continue on BIPAP with O2 At bedtime  and with naps.  BIPAP download requested.   Chronic Respiratory Failure -continue on O2 2l/m to keep O2 sats >88-90%  Fever ? Etiology . Advised to check Flu and Covid , call back if positive.  follow up with PCP for urinary symptoms .  Fluids and reset  Please contact office for sooner follow up if symptoms do not improve or worsen or seek emergency care    Plan  Patient Instructions  Continue on Prednisone  10mg  daily .  Remains on Oxygen  2l/m .  Continue on BIPAP At bedtime  with oxygen  . Fluids and rest  Test for Covid and Flu , call back if positive  Follow up with PCP today regarding your urinary issues.  Order to DME for new Oxygen  supplies.  BIPAP serial number.  Follow up with Dr. Jude  or [facility] Anagnos [facility]p in 6 months (Virtual only )  and As needed   Please contact office for sooner follow up  if symptoms do not improve or worsen or seek emergency care     Follow Up Instructions:    I discussed the assessment and treatment plan with the patient. The patient was provided an opportunity to ask questions and all were answered. The patient agreed with the plan and demonstrated an understanding of the instructions.   The patient was advised to call back or seek an in-person evaluation if the symptoms worsen or if the condition fails to improve as anticipated.  I provided 30 minutes of non-face-to-face time during this encounter.   Madelin Stank, NP

## 2024-11-27 NOTE — Telephone Encounter (Signed)
 FYI Only or Action Required?: FYI only for provider: appointment scheduled on 1/28.  Patient was last seen in primary care on 12/14/2023 by Merna Huxley, NP.  Called Nurse Triage reporting Generalized Body Aches, Headache, and Fever.  Symptoms began several days ago.  Interventions attempted: OTC medications: Tylenol .  Symptoms are: gradually worsening.  Triage Disposition: See Physician Within 24 Hours  Patient/caregiver understands and will follow disposition?: Yes   3 days ago onset of chills, body aches (mostly neck and shoulders) and headaches. Temp has ranged from 100-102 F forehead. This morning 101 F. Drinking plenty of fluids. 4/10 headache pain. 6-7/10 body ache pain. No CP. Scheduled virtual appt with PCP today. Advised UC or ED for worsening symptoms.     Message from [facility] sent at 11/27/2024 10:40 AM EST  Reason for Triage: Patient has been experiencing, chills body aches, and high temp, slight headache ongoing 3 days, would like virtual appt   Reason for Disposition  Fever present > 3 days (72 hours)  Protocols used: Muscle Aches and Body Pain-A-AH

## 2024-11-27 NOTE — Patient Instructions (Addendum)
 Continue on Prednisone  10mg  daily .  Remains on Oxygen  2l/m .  Continue on BIPAP At bedtime  with oxygen  . Fluids and rest  Test for Covid and Flu , call back if positive  Follow up with PCP today regarding your urinary issues.  Order to DME for new Oxygen  supplies.  BIPAP serial number.  Follow up with Dr. Jude  or Maryam Feely Np in 6 months (Virtual only )  and As needed   Please contact office for sooner follow up if symptoms do not improve or worsen or seek emergency care

## 2024-11-27 NOTE — Progress Notes (Signed)
 Virtual Visit via Video Note  I connected withNAME@ on 11/27/24 at  3:30 PM EST by a video enabled telemedicine application and verified that I am speaking with the correct person using two identifiers.  Location patient: home Location provider:work or home office Persons participating in the virtual visit: patient, provider  I discussed the limitations of evaluation and management by telemedicine and the availability of in person appointments. The patient expressed understanding and agreed to proceed.   HPI: Discussed the use of AI scribe software for clinical note transcription with the patient, who gave verbal consent to proceed.  History of Present Illness   [name] is a 59 year old female who presents with chills, fever, and body aches.  For three days, she has had alternating chills and feeling very hot, with body aches in her shoulders and  back. Fever has reached 101.44F, improves with Tylenol , then recurs after 4-5 hours with return of chills and heat sensations.  During this time she has had dark yellow urine, diarrhea, and persistent nausea with poor oral intake and difficulty keeping food down. She notes urinary frequency and urgency without burning and denies cough or lower pelvic pain.  She is not aware of sick contacts. She has not needed to increase her oxygen  use and has not taken antibiotics recently.   At home she did a covid and flu test earlier today which was negative        ROS: See pertinent positives and negatives per HPI.  Past Medical History:  Diagnosis Date   Anemia    Arthritis    hands, shoulders, no meds   CHF (congestive heart failure) (HCC)    EF60-65%   Chronic hyperventilation syndrome    w/ obesity tx with albuterol  inhaler and oxygen  2L   COPD (chronic obstructive pulmonary disease) (HCC)    uses oxygen  2 L   Gallstones    GERD (gastroesophageal reflux disease)    diet controlled - no meds   H/O hiatal hernia     Hypertension    Hypothyroidism    Kidney stones    Morbid obesity (HCC)    Pneumonia    hoispitalized in 08/2011   Sarcoidosis    Seasonal allergies    Sleep apnea    uses CPAP machine     Past Surgical History:  Procedure Laterality Date   CESAREAN SECTION  1994   x 1   CHOLECYSTECTOMY  2000   DILATION AND CURETTAGE OF UTERUS N/A 08/09/2016   Procedure: DILATATION AND CURETTAGE;  Surgeon: Maurilio Ship, MD;  Location: [facility];  Service: Gynecology;  Laterality: N/A;   HYSTEROSCOPY WITH D & C  12/27/2011   Procedure: DILATATION AND CURETTAGE /HYSTEROSCOPY;  Surgeon: Rexene PARAS. Rosalva, MD;  Location: [facility];  Service: Gynecology;;   I and D of abcess  05/2011   INTRAUTERINE DEVICE (IUD) INSERTION N/A 08/09/2016   Procedure: INTRAUTERINE DEVICE (IUD) INSERTION;  Surgeon: Maurilio Ship, MD;  Location: [facility];  Service: Gynecology;  Laterality: N/A;   LUNG BIOPSY     uterine abletion      Family History  Problem Relation Age of Onset   Diabetes Father    Cancer Father 65       colon cancer    Diabetes Brother    Deep vein thrombosis Mother    Aneurysm Sister        d/o brain aneurysm      Current Medications[1]  EXAM:  VITALS per patient  if applicable:  GENERAL: alert, oriented, appears well and in no acute distress  HEENT: atraumatic, conjunttiva clear, no obvious abnormalities on inspection of external nose and ears  NECK: normal movements of the head and neck  LUNGS: on inspection no signs of respiratory distress, breathing rate appears normal, no obvious gross SOB, gasping or wheezing  CV: no obvious cyanosis  MS: moves all visible extremities without noticeable abnormality  PSYCH/NEURO: pleasant and cooperative, no obvious depression or anxiety, speech and thought processing grossly intact  ASSESSMENT AND PLAN:  Discussed the following assessment and plan:  Assessment and Plan    Urinary tract infection Symptoms and differential diagnosis suggest UTI.  Augmentin  prescribed to cover UTI and potential upper respiratory infection. - Prescribed Augmentin  for 7 days. - Instructed to contact if not improved by Monday.  Acute upper respiratory infection Symptoms suggest possible upper respiratory infection. Augmentin  prescribed to cover both UTI and potential upper respiratory infection. - Prescribed Augmentin  for 7 days.  Nausea with vomiting Severe nausea impacting oral intake, unable to keep anything down. - Prescribed Zofran  for 7 days. - Advised to stay hydrated.       I discussed the assessment and treatment plan with the patient. The patient was provided an opportunity to ask questions and all were answered. The patient agreed with the plan and demonstrated an understanding of the instructions.   The patient was advised to call back or seek an in-person evaluation if the symptoms worsen or if the condition fails to improve as anticipated.   Darleene Shape, NP     [1]  Current Outpatient Medications:    acetaminophen  (TYLENOL ) 500 MG tablet, Take 1 tablet (500 mg total) by mouth every 6 (six) hours as needed., Disp: 30 tablet, Rfl: 0   albuterol  (VENTOLIN  HFA) 108 (90 Base) MCG/ACT inhaler, INHALE 1-2 PUFFS BY MOUTH EVERY 6 HOURS AS NEEDED FOR WHEEZE OR SHORTNESS OF BREATH, Disp: 8.5 each, Rfl: 3   amoxicillin -clavulanate (AUGMENTIN ) 875-125 MG tablet, Take 1 tablet by mouth 2 (two) times daily for 7 days., Disp: 14 tablet, Rfl: 0   atorvastatin  (LIPITOR) 40 MG tablet, TAKE 1 TABLET BY MOUTH DAILY AT 6 PM., Disp: 30 tablet, Rfl: 0   calcium  citrate-vitamin D  (CITRACAL+D) 315-200 MG-UNIT tablet, Take 1 tablet by mouth daily., Disp: , Rfl:    cetirizine (ZYRTEC) 10 MG tablet, Take 10 mg by mouth at bedtime as needed for allergies., Disp: , Rfl:    famotidine  (PEPCID ) 40 MG tablet, TAKE 1 TABLET BY MOUTH TWICE A DAY, Disp: 60 tablet, Rfl: 0   metoprolol  succinate (TOPROL -XL) 25 MG 24 hr tablet, TAKE 1 TABLET (25 MG TOTAL) BY MOUTH  DAILY., Disp: 30 tablet, Rfl: 0   NON FORMULARY, 2 liter of oxygen , Disp: , Rfl:    ondansetron  (ZOFRAN ) 4 MG tablet, Take 1 tablet (4 mg total) by mouth every 8 (eight) hours as needed for nausea or vomiting., Disp: 20 tablet, Rfl: 0   predniSONE  (DELTASONE ) 10 MG tablet, Take 1 tablet (10 mg total) by mouth daily with breakfast., Disp: 30 tablet, Rfl: 5   triamcinolone  ointment (KENALOG ) 0.1 %, Apply 1 application topically 2 (two) times daily. For Sarcoidosis, Disp: , Rfl:    vitamin B-12 1000 MCG tablet, Take 1 tablet (1,000 mcg total) by mouth daily., Disp: 30 tablet, Rfl: 0

## 2024-11-29 ENCOUNTER — Telehealth: Payer: Self-pay | Admitting: *Deleted

## 2024-11-29 NOTE — Telephone Encounter (Signed)
 Other ways to qualify if patient can't complete walk test:  02 88% or lower  at rest Arterial 02 sats 89 po2 56-59 mm hg @ at rest or during exercise Dx:  Pulm htn and edema Hematocrit blood gas at rest or during exercise 55 mmhg or arterial sat at or below 88%; Pulm artery pressure   Copied from CRM #[id number]. Topic: Clinical - Order For Equipment >> Nov 26, 2024 12:06 PM Rilla B wrote: Reason for CRM: [name] from [employer] for [facility] @ ext [phone].  No answer. Please call [phone] ext [phone] regarding patient's inability to complete a walk test.   Spoke with [name] at [employer] and made of aware pt is at 88% on room air per last ov note. He asked that note be faxed [phone]. NFN
